# Patient Record
Sex: Female | Born: 1965
Health system: Southern US, Community
[De-identification: ages and names within clinical notes are randomized; demographics above are authoritative.]

## PROBLEM LIST (undated history)

## (undated) DIAGNOSIS — Z8739 Personal history of other diseases of the musculoskeletal system and connective tissue: Secondary | ICD-10-CM

## (undated) DIAGNOSIS — M545 Low back pain, unspecified: Secondary | ICD-10-CM

## (undated) DIAGNOSIS — R12 Heartburn: Secondary | ICD-10-CM

## (undated) DIAGNOSIS — L509 Urticaria, unspecified: Secondary | ICD-10-CM

## (undated) DIAGNOSIS — C439 Malignant melanoma of skin, unspecified: Secondary | ICD-10-CM

## (undated) DIAGNOSIS — J449 Chronic obstructive pulmonary disease, unspecified: Secondary | ICD-10-CM

## (undated) DIAGNOSIS — R002 Palpitations: Secondary | ICD-10-CM

## (undated) DIAGNOSIS — T7840XA Allergy, unspecified, initial encounter: Secondary | ICD-10-CM

## (undated) DIAGNOSIS — C801 Malignant (primary) neoplasm, unspecified: Secondary | ICD-10-CM

## (undated) DIAGNOSIS — R197 Diarrhea, unspecified: Secondary | ICD-10-CM

## (undated) DIAGNOSIS — R0602 Shortness of breath: Secondary | ICD-10-CM

## (undated) DIAGNOSIS — F329 Major depressive disorder, single episode, unspecified: Secondary | ICD-10-CM

## (undated) DIAGNOSIS — R87619 Unspecified abnormal cytological findings in specimens from cervix uteri: Secondary | ICD-10-CM

## (undated) DIAGNOSIS — J45909 Unspecified asthma, uncomplicated: Secondary | ICD-10-CM

## (undated) DIAGNOSIS — F32A Depression, unspecified: Secondary | ICD-10-CM

## (undated) DIAGNOSIS — K5900 Constipation, unspecified: Secondary | ICD-10-CM

## (undated) DIAGNOSIS — T783XXA Angioneurotic edema, initial encounter: Secondary | ICD-10-CM

## (undated) DIAGNOSIS — R7303 Prediabetes: Secondary | ICD-10-CM

## (undated) DIAGNOSIS — E669 Obesity, unspecified: Secondary | ICD-10-CM

## (undated) DIAGNOSIS — F419 Anxiety disorder, unspecified: Secondary | ICD-10-CM

## (undated) DIAGNOSIS — I38 Endocarditis, valve unspecified: Secondary | ICD-10-CM

## (undated) DIAGNOSIS — K589 Irritable bowel syndrome without diarrhea: Secondary | ICD-10-CM

## (undated) DIAGNOSIS — M2550 Pain in unspecified joint: Secondary | ICD-10-CM

## (undated) DIAGNOSIS — R32 Unspecified urinary incontinence: Secondary | ICD-10-CM

## (undated) DIAGNOSIS — R079 Chest pain, unspecified: Secondary | ICD-10-CM

## (undated) DIAGNOSIS — Z91018 Allergy to other foods: Secondary | ICD-10-CM

## (undated) DIAGNOSIS — E785 Hyperlipidemia, unspecified: Secondary | ICD-10-CM

## (undated) DIAGNOSIS — I10 Essential (primary) hypertension: Secondary | ICD-10-CM

## (undated) HISTORY — DX: Depression, unspecified: F32.A

## (undated) HISTORY — DX: Endocarditis, valve unspecified: I38

## (undated) HISTORY — DX: Unspecified abnormal cytological findings in specimens from cervix uteri: R87.619

## (undated) HISTORY — DX: Shortness of breath: R06.02

## (undated) HISTORY — DX: Obesity, unspecified: E66.9

## (undated) HISTORY — DX: Low back pain: M54.5

## (undated) HISTORY — DX: Malignant melanoma of skin, unspecified: C43.9

## (undated) HISTORY — DX: Urticaria, unspecified: L50.9

## (undated) HISTORY — DX: Allergy, unspecified, initial encounter: T78.40XA

## (undated) HISTORY — DX: Chronic obstructive pulmonary disease, unspecified: J44.9

## (undated) HISTORY — DX: Diarrhea, unspecified: R19.7

## (undated) HISTORY — DX: Personal history of other diseases of the musculoskeletal system and connective tissue: Z87.39

## (undated) HISTORY — DX: Irritable bowel syndrome, unspecified: K58.9

## (undated) HISTORY — PX: SKIN CANCER EXCISION: SHX779

## (undated) HISTORY — DX: Pain in unspecified joint: M25.50

## (undated) HISTORY — DX: Allergy to other foods: Z91.018

## (undated) HISTORY — DX: Major depressive disorder, single episode, unspecified: F32.9

## (undated) HISTORY — DX: Prediabetes: R73.03

## (undated) HISTORY — DX: Chest pain, unspecified: R07.9

## (undated) HISTORY — DX: Anxiety disorder, unspecified: F41.9

## (undated) HISTORY — DX: Heartburn: R12

## (undated) HISTORY — DX: Hyperlipidemia, unspecified: E78.5

## (undated) HISTORY — DX: Palpitations: R00.2

## (undated) HISTORY — DX: Constipation, unspecified: K59.00

## (undated) HISTORY — DX: Unspecified urinary incontinence: R32

## (undated) HISTORY — DX: Angioneurotic edema, initial encounter: T78.3XXA

## (undated) HISTORY — DX: Low back pain, unspecified: M54.50

## (undated) HISTORY — PX: TONSILLECTOMY: SUR1361

---

## 1993-05-18 HISTORY — PX: LEEP: SHX91

## 2000-05-18 DIAGNOSIS — C439 Malignant melanoma of skin, unspecified: Secondary | ICD-10-CM

## 2000-05-18 HISTORY — DX: Malignant melanoma of skin, unspecified: C43.9

## 2006-04-05 ENCOUNTER — Ambulatory Visit: Payer: Self-pay | Admitting: Internal Medicine

## 2007-05-17 ENCOUNTER — Ambulatory Visit: Payer: Self-pay | Admitting: Internal Medicine

## 2008-05-23 ENCOUNTER — Ambulatory Visit: Payer: Self-pay | Admitting: Internal Medicine

## 2008-06-04 ENCOUNTER — Ambulatory Visit: Payer: Self-pay | Admitting: Internal Medicine

## 2008-12-03 ENCOUNTER — Ambulatory Visit: Payer: Self-pay | Admitting: Internal Medicine

## 2009-06-11 ENCOUNTER — Ambulatory Visit: Payer: Self-pay | Admitting: Internal Medicine

## 2010-02-01 ENCOUNTER — Emergency Department: Payer: Self-pay | Admitting: Emergency Medicine

## 2010-04-14 ENCOUNTER — Other Ambulatory Visit: Payer: Self-pay | Admitting: Internal Medicine

## 2010-05-27 LAB — HM PAP SMEAR: HM Pap smear: NORMAL

## 2011-03-06 ENCOUNTER — Other Ambulatory Visit: Payer: Self-pay | Admitting: *Deleted

## 2011-03-06 ENCOUNTER — Other Ambulatory Visit: Payer: Self-pay | Admitting: Internal Medicine

## 2011-03-06 MED ORDER — FLUOXETINE HCL 10 MG PO CAPS: 10.0000 mg | ORAL_CAPSULE | Freq: Every day | ORAL | Status: DC

## 2011-03-06 MED ORDER — RANITIDINE HCL 150 MG PO TABS: 150.0000 mg | ORAL_TABLET | Freq: Two times a day (BID) | ORAL | Status: DC

## 2011-03-09 ENCOUNTER — Ambulatory Visit: Payer: Self-pay | Admitting: Internal Medicine

## 2011-03-10 ENCOUNTER — Encounter: Payer: Self-pay | Admitting: Internal Medicine

## 2011-03-10 ENCOUNTER — Ambulatory Visit (INDEPENDENT_AMBULATORY_CARE_PROVIDER_SITE_OTHER): Payer: Medicare Other | Admitting: Internal Medicine

## 2011-03-10 ENCOUNTER — Other Ambulatory Visit: Payer: Self-pay | Admitting: Internal Medicine

## 2011-03-10 VITALS — BP 124/87 | HR 75 | Temp 98.2°F | Resp 16 | Ht 62.0 in | Wt 211.0 lb

## 2011-03-10 DIAGNOSIS — J329 Chronic sinusitis, unspecified: Secondary | ICD-10-CM

## 2011-03-10 MED ORDER — AMOXICILLIN-POT CLAVULANATE 875-125 MG PO TABS: 1.0000 | ORAL_TABLET | Freq: Two times a day (BID) | ORAL | Status: AC

## 2011-03-10 MED ORDER — ALPRAZOLAM 0.5 MG PO TABS: ORAL_TABLET | ORAL | Status: DC

## 2011-03-10 NOTE — Telephone Encounter (Signed)
Yes, should be seen.

## 2011-03-10 NOTE — Progress Notes (Signed)
  Subjective:    Patient ID: Brittney Ruiz, female    DOB: 09-24-1965, 45 y.o.   MRN: [id number]  HPI  89 yowf with history of depression with anxiety, multple drug allergies presents with coughof four days duration, started on Friday initially productive of clear sputum  now green for the past 24 hours, accompanied  by frontal headache/pressure, terrible sore throat and an elevation of body temp by 2 pts.  Cannot take sudafed or sudafed PE bc "it makes her crazy." Has not been on antibioitics in over 6 months. . Past Medical History  Diagnosis Date  . Depression   . Allergy   . Anxiety   . Obesity (BMI 30-39.9)    Current Outpatient Prescriptions on File Prior to Visit  Medication Sig Dispense Refill  . ALPRAZolam (XANAX) 0.5 MG tablet Take one tablet in morning, 1 tablet at 11:00 am, one tablet at 3:00 pm, 1 Tablet at 7:00 pm, and 2 tablets at bedtime.  180 tablet  0  . FLUoxetine (PROZAC) 10 MG capsule Take 1 capsule (10 mg total) by mouth daily.  90 capsule  0  . ranitidine (ZANTAC) 150 MG tablet Take 1 tablet (150 mg total) by mouth 2 (two) times daily.  180 tablet  0        Review of Systems  Constitutional: Positive for fever, chills and fatigue.  HENT: Positive for ear pain, congestion, sore throat, rhinorrhea, trouble swallowing and postnasal drip. Negative for neck stiffness and ear discharge.   Respiratory: Positive for cough. Negative for shortness of breath and wheezing.   Cardiovascular: Negative for chest pain.  All other systems reviewed and are negative.       Objective:   Physical Exam  HENT:  Head: Normocephalic and atraumatic.  Right Ear: Tympanic membrane is bulging.  Left Ear: Tympanic membrane is bulging.  Nose: Right sinus exhibits frontal sinus tenderness. Left sinus exhibits frontal sinus tenderness.  Pulmonary/Chest: Effort normal and breath sounds normal. No accessory muscle usage. No respiratory distress.  Lymphadenopathy:    She has cervical  adenopathy.       Right cervical: Superficial cervical and posterior cervical adenopathy present.       Left cervical: Superficial cervical and posterior cervical adenopathy present.          Assessment & Plan:  Sinusist:  With associated otitis one exam;  Will treat with augmentin bid x 7 days #14.  OTC cough suppressants and Afrin recommended.

## 2011-03-10 NOTE — Telephone Encounter (Signed)
PT CALLED CHECKING ON RX  FEXOFEADINE HCL 180 1 DAILY ALPRAZOLAM 0.5MG  PT TAKE   1 IN AM  1 @11AM    1 @ 3PM    1 @ 7PM   2 @ BED TIME MEDICAP

## 2011-03-10 NOTE — Telephone Encounter (Signed)
SEE BELOW   PT ALSO WANTED TO BE SEEN  MADE APPOINTMENT FOR 10/31 COUGHING UP GREEN STUFF  CAN SHE BE SEE EARLY?

## 2011-03-10 NOTE — Telephone Encounter (Signed)
Should be seen early.

## 2011-03-11 ENCOUNTER — Encounter: Payer: Self-pay | Admitting: Internal Medicine

## 2011-03-11 DIAGNOSIS — F329 Major depressive disorder, single episode, unspecified: Secondary | ICD-10-CM | POA: Insufficient documentation

## 2011-03-11 DIAGNOSIS — T7840XA Allergy, unspecified, initial encounter: Secondary | ICD-10-CM | POA: Insufficient documentation

## 2011-03-11 DIAGNOSIS — F32A Depression, unspecified: Secondary | ICD-10-CM | POA: Insufficient documentation

## 2011-03-11 DIAGNOSIS — E669 Obesity, unspecified: Secondary | ICD-10-CM | POA: Insufficient documentation

## 2011-03-11 DIAGNOSIS — F419 Anxiety disorder, unspecified: Secondary | ICD-10-CM | POA: Insufficient documentation

## 2011-03-11 NOTE — Telephone Encounter (Signed)
Pt seen by dr Darrick Huntsman 03/11/11

## 2011-03-17 ENCOUNTER — Other Ambulatory Visit: Payer: Self-pay | Admitting: Internal Medicine

## 2011-03-17 MED ORDER — ALPRAZOLAM 0.5 MG PO TABS: ORAL_TABLET | ORAL | Status: DC

## 2011-03-17 MED ORDER — FEXOFENADINE HCL 180 MG PO TABS: 180.0000 mg | ORAL_TABLET | Freq: Every day | ORAL | Status: DC

## 2011-03-20 NOTE — Telephone Encounter (Signed)
Xanax faxed into pharm

## 2011-03-27 ENCOUNTER — Telehealth: Payer: Self-pay | Admitting: Internal Medicine

## 2011-03-27 ENCOUNTER — Encounter: Payer: Self-pay | Admitting: Internal Medicine

## 2011-03-27 ENCOUNTER — Ambulatory Visit (INDEPENDENT_AMBULATORY_CARE_PROVIDER_SITE_OTHER): Payer: Medicare Other | Admitting: Internal Medicine

## 2011-03-27 ENCOUNTER — Encounter: Payer: Medicare Other | Admitting: Internal Medicine

## 2011-03-27 VITALS — BP 120/86 | HR 75 | Temp 98.2°F | Wt 208.0 lb

## 2011-03-27 DIAGNOSIS — Z0000 Encounter for general adult medical examination without abnormal findings: Secondary | ICD-10-CM

## 2011-03-27 DIAGNOSIS — E785 Hyperlipidemia, unspecified: Secondary | ICD-10-CM

## 2011-03-27 DIAGNOSIS — J40 Bronchitis, not specified as acute or chronic: Secondary | ICD-10-CM

## 2011-03-27 LAB — COMPREHENSIVE METABOLIC PANEL
Alkaline Phosphatase: 57 U/L (ref 39–117)
CO2: 24 mEq/L (ref 19–32)
Creatinine, Ser: 0.7 mg/dL (ref 0.4–1.2)
GFR: 96.56 mL/min (ref >60.00)
Glucose, Bld: 92 mg/dL (ref 70–99)
Total Bilirubin: 0.6 mg/dL (ref 0.3–1.2)

## 2011-03-27 LAB — CBC WITH DIFFERENTIAL/PLATELET
Basophils Absolute: 0 10*3/uL (ref 0.0–0.1)
HCT: 42.5 % (ref 36.0–46.0)
Hemoglobin: 14.4 g/dL (ref 12.0–15.0)
Lymphs Abs: 2.4 10*3/uL (ref 0.7–4.0)
MCV: 90.2 fl (ref 78.0–100.0)
Monocytes Absolute: 0.5 10*3/uL (ref 0.1–1.0)
Monocytes Relative: 5.8 % (ref 3.0–12.0)
Neutro Abs: 5.1 10*3/uL (ref 1.4–7.7)
RDW: 13.3 % (ref 11.5–14.6)

## 2011-03-27 LAB — LIPID PANEL: HDL: 34.5 mg/dL — ABNORMAL LOW (ref >39.00)

## 2011-03-27 MED ORDER — LEVOFLOXACIN 750 MG PO TABS: 750.0000 mg | ORAL_TABLET | Freq: Every day | ORAL | Status: AC

## 2011-03-27 MED ORDER — ALBUTEROL SULFATE HFA 108 (90 BASE) MCG/ACT IN AERS: 2.0000 | INHALATION_SPRAY | Freq: Four times a day (QID) | RESPIRATORY_TRACT | Status: DC | PRN

## 2011-03-27 MED ORDER — SPACER/AERO CHAMBER MOUTHPIECE MISC: 1.0000 | Status: DC | PRN

## 2011-03-27 NOTE — Telephone Encounter (Signed)
Updated Allergy list

## 2011-03-27 NOTE — Progress Notes (Signed)
Subjective:    Patient ID: Brittney Ruiz, female    DOB: Mar 13, 1966, 45 y.o.   MRN: [id number]  HPI 45YO female presents for an acute visit complaining of sore throat, congestion, and cough over the last week. She notes that she was previously seen for similar symptoms approximately one month ago. She was treated with Augmentin and her symptoms improved. Then over the last week she has developed progressive sore throat and cough with some wheezing noted. She denies any chest pain or shortness of breath. She notes a history of COPD. She is a former smoker. She denies any fever or chills. She has several sick contacts.  Outpatient Encounter Prescriptions as of 03/27/2011  Medication Sig Dispense Refill  . ALPRAZolam (XANAX) 0.5 MG tablet Take one tablet in morning, 1 tablet at 11:00 am, one tablet at 3:00 pm, 1 Tablet at 7:00 pm, and 2 tablets at bedtime.  180 tablet  0  . Cetirizine HCl (ZYRTEC PO) Take 1 tablet by mouth daily.        Tery Sanfilippo Calcium (STOOL SOFTENER PO) Take 1 tablet by mouth as needed.        . fexofenadine (ALLEGRA) 180 MG tablet Take 1 tablet (180 mg total) by mouth daily.  30 tablet  3  . FLUoxetine (PROZAC) 10 MG capsule Take 1 capsule (10 mg total) by mouth daily.  90 capsule  0  . ranitidine (ZANTAC) 150 MG tablet Take 1 tablet (150 mg total) by mouth 2 (two) times daily.  180 tablet  0    Review of Systems  Constitutional: Negative for fever, chills, appetite change, fatigue and unexpected weight change.  HENT: Positive for congestion, sore throat, rhinorrhea and sinus pressure. Negative for ear pain, trouble swallowing, neck pain and voice change.   Eyes: Negative for visual disturbance.  Respiratory: Positive for cough and wheezing. Negative for shortness of breath and stridor.   Cardiovascular: Negative for chest pain, palpitations and leg swelling.  Gastrointestinal: Negative for nausea, vomiting, abdominal pain, diarrhea, constipation, blood in stool, abdominal  distention and anal bleeding.  Genitourinary: Negative for dysuria and flank pain.  Musculoskeletal: Negative for myalgias, arthralgias and gait problem.  Skin: Negative for color change and rash.  Neurological: Negative for dizziness and headaches.  Hematological: Negative for adenopathy. Does not bruise/bleed easily.  Psychiatric/Behavioral: Negative for suicidal ideas, sleep disturbance and dysphoric mood. The patient is not nervous/anxious.    BP 120/86  Pulse 75  Temp(Src) 98.2 F (36.8 C) (Oral)  Wt 208 lb (94.348 kg)  SpO2 96%  LMP 03/09/2011     Objective:   Physical Exam  Constitutional: She is oriented to person, place, and time. She appears well-developed and well-nourished. No distress.  HENT:  Head: Normocephalic and atraumatic.  Right Ear: External ear normal. A middle ear effusion is present.  Left Ear: External ear normal. A middle ear effusion is present.  Nose: Nose normal.  Mouth/Throat: Posterior oropharyngeal erythema present. No oropharyngeal exudate.  Eyes: Conjunctivae are normal. Pupils are equal, round, and reactive to light. Right eye exhibits no discharge. Left eye exhibits no discharge. No scleral icterus.  Neck: Normal range of motion. Neck supple. No tracheal deviation present. No thyromegaly present.  Cardiovascular: Normal rate, regular rhythm, normal heart sounds and intact distal pulses.  Exam reveals no gallop and no friction rub.   No murmur heard. Pulmonary/Chest: Effort normal. No respiratory distress. She has no decreased breath sounds. She has wheezes in the right upper field, the right  middle field and the right lower field. She has no rhonchi. She has no rales. She exhibits no tenderness.  Musculoskeletal: Normal range of motion. She exhibits no edema and no tenderness.  Lymphadenopathy:    She has no cervical adenopathy.  Neurological: She is alert and oriented to person, place, and time. No cranial nerve deficit. She exhibits normal  muscle tone. Coordination normal.  Skin: Skin is warm and dry. No rash noted. She is not diaphoretic. No erythema. No pallor.  Psychiatric: She has a normal mood and affect. Her behavior is normal. Judgment and thought content normal.          Assessment & Plan:  1. Bronchitis - Will treat with levaquin and prn albuterol. Pt unable to use steroids because of severe anxiety.  Pt will call if symptoms not improving. Otherwise, follow up next week.

## 2011-04-02 ENCOUNTER — Ambulatory Visit: Payer: Self-pay | Admitting: Internal Medicine

## 2011-04-03 ENCOUNTER — Ambulatory Visit (INDEPENDENT_AMBULATORY_CARE_PROVIDER_SITE_OTHER): Payer: Medicare Other | Admitting: Internal Medicine

## 2011-04-03 ENCOUNTER — Other Ambulatory Visit (HOSPITAL_COMMUNITY)
Admission: RE | Admit: 2011-04-03 | Discharge: 2011-04-03 | Disposition: A | Discharge status: Home / Self Care | Payer: Medicare Other | Source: Ambulatory Visit | Attending: Internal Medicine | Admitting: Internal Medicine

## 2011-04-03 ENCOUNTER — Encounter: Payer: Self-pay | Admitting: Internal Medicine

## 2011-04-03 VITALS — BP 104/76 | HR 85 | Temp 98.2°F | Resp 16 | Ht 62.5 in | Wt 209.2 lb

## 2011-04-03 DIAGNOSIS — Z01419 Encounter for gynecological examination (general) (routine) without abnormal findings: Secondary | ICD-10-CM | POA: Insufficient documentation

## 2011-04-03 DIAGNOSIS — Z0000 Encounter for general adult medical examination without abnormal findings: Secondary | ICD-10-CM

## 2011-04-03 DIAGNOSIS — R8290 Unspecified abnormal findings in urine: Secondary | ICD-10-CM

## 2011-04-03 DIAGNOSIS — R82998 Other abnormal findings in urine: Secondary | ICD-10-CM

## 2011-04-03 DIAGNOSIS — L988 Other specified disorders of the skin and subcutaneous tissue: Secondary | ICD-10-CM

## 2011-04-03 LAB — POCT URINALYSIS DIPSTICK
Bilirubin, UA: NEGATIVE
Glucose, UA: NEGATIVE
Ketones, UA: NEGATIVE
Leukocytes, UA: NEGATIVE
pH, UA: 5.5

## 2011-04-03 NOTE — Progress Notes (Signed)
Subjective:    Patient ID: Brittney Ruiz, female    DOB: 1965/12/02, 45 y.o.   MRN: [id number]  HPI 45YO female presents for annual exam. Feeling well. Bronchitis resolved with treatment with levaquin, however did have left knee pain and swelling on medication and stopped after 5 days. No further cough/congestion. Denies any new complaints today. Has noted a new brown mole on her right foot at site of previous melanoma excision. Noted this only a few days ago. Has not had recent follow up with dermatology. Otherwise, doing well. We reviewed labs from previous visit and discussed need to repeat slightly elevated LFT.  Outpatient Encounter Prescriptions as of 04/03/2011  Medication Sig Dispense Refill  . ACETAMINOPHEN PO Take by mouth as needed.        Marland Kitchen albuterol (PROAIR HFA) 108 (90 BASE) MCG/ACT inhaler Inhale 2 puffs into the lungs every 6 (six) hours as needed for wheezing.  1 Inhaler  3  . ALPRAZolam (XANAX) 0.5 MG tablet Take one tablet in morning, 1 tablet at 11:00 am, one tablet at 3:00 pm, 1 Tablet at 7:00 pm, and 2 tablets at bedtime.  180 tablet  0  . Cetirizine HCl (ZYRTEC PO) Take 1 tablet by mouth daily.        Tery Sanfilippo Calcium (STOOL SOFTENER PO) Take 1 tablet by mouth as needed.        . fexofenadine (ALLEGRA) 180 MG tablet Take 1 tablet (180 mg total) by mouth daily.  30 tablet  3  . FLUoxetine (PROZAC) 10 MG capsule Take 1 capsule (10 mg total) by mouth daily.  90 capsule  0  . ranitidine (ZANTAC) 150 MG tablet Take 1 tablet (150 mg total) by mouth 2 (two) times daily.  180 tablet  0  . Spacer/Aero Chamber Mouthpiece MISC 1 puff by Does not apply route as needed.  1 each  1  . levofloxacin (LEVAQUIN) 750 MG tablet Take 1 tablet (750 mg total) by mouth daily.  7 tablet  0    Review of Systems  Constitutional: Negative for fever, chills, appetite change, fatigue and unexpected weight change.  HENT: Negative for ear pain, congestion, sore throat, trouble swallowing, neck pain,  voice change and sinus pressure.   Eyes: Negative for visual disturbance.  Respiratory: Negative for cough, shortness of breath, wheezing and stridor.   Cardiovascular: Negative for chest pain, palpitations and leg swelling.  Gastrointestinal: Positive for constipation. Negative for nausea, vomiting, abdominal pain, diarrhea, blood in stool, abdominal distention and anal bleeding.  Genitourinary: Negative for dysuria, urgency, frequency, hematuria, flank pain, vaginal bleeding, vaginal discharge and pelvic pain.  Musculoskeletal: Negative for myalgias, arthralgias and gait problem.  Skin: Negative for color change and rash.  Neurological: Negative for dizziness and headaches.  Hematological: Negative for adenopathy. Does not bruise/bleed easily.  Psychiatric/Behavioral: Negative for suicidal ideas, sleep disturbance and dysphoric mood. The patient is nervous/anxious.    BP 104/76  Pulse 85  Temp(Src) 98.2 F (36.8 C) (Oral)  Resp 16  Ht 5' 2.5" (1.588 m)  Wt 209 lb 4 oz (94.915 kg)  BMI 37.66 kg/m2  SpO2 97%  LMP 03/09/2011     Objective:   Physical Exam  Constitutional: She is oriented to person, place, and time. She appears well-developed and well-nourished. No distress.  HENT:  Head: Normocephalic and atraumatic.  Right Ear: External ear normal.  Left Ear: External ear normal.  Nose: Nose normal.  Mouth/Throat: Oropharynx is clear and moist. No oropharyngeal exudate.  Eyes:  Conjunctivae are normal. Pupils are equal, round, and reactive to light. Right eye exhibits no discharge. Left eye exhibits no discharge. No scleral icterus.  Neck: Normal range of motion. Neck supple. No tracheal deviation present. No thyromegaly present.  Cardiovascular: Normal rate, regular rhythm, normal heart sounds and intact distal pulses.  Exam reveals no gallop and no friction rub.   No murmur heard. Pulmonary/Chest: Effort normal and breath sounds normal. No respiratory distress. She has no  wheezes. She has no rales. She exhibits no tenderness.  Abdominal: Soft. Bowel sounds are normal. She exhibits no distension and no mass. There is no tenderness. There is no rebound and no guarding.  Genitourinary: Rectum normal and uterus normal. No breast swelling, tenderness, discharge or bleeding. Pelvic exam was performed with patient prone. There is no rash, tenderness or lesion on the right labia. There is no rash, tenderness or lesion on the left labia. Uterus is not enlarged and not tender. Cervix exhibits no motion tenderness, no discharge and no friability. Right adnexum displays no mass, no tenderness and no fullness. Left adnexum displays no mass, no tenderness and no fullness. No erythema or tenderness around the vagina. Vaginal discharge (yellow) found.    Musculoskeletal: Normal range of motion. She exhibits no edema and no tenderness.  Lymphadenopathy:    She has no cervical adenopathy.  Neurological: She is alert and oriented to person, place, and time. No cranial nerve deficit. She exhibits normal muscle tone. Coordination normal.  Skin: Skin is warm and dry. No rash noted. She is not diaphoretic. No erythema. No pallor.     Psychiatric: She has a normal mood and affect. Her behavior is normal. Judgment and thought content normal.          Assessment & Plan:  1. General Exam - Exam today normal including breast and pelvic exam. PAP pending.  Labs including CBC, CMP, lipids reviewed.   2. Abnormal LFT - Suspect steatohepatitis. Will repeat LFTs in 21month given mild elevation ALT.  3. Macule right foot - Pt will set up follow up with dermatology given h/o melanoma at this location.

## 2011-04-13 ENCOUNTER — Telehealth: Payer: Self-pay | Admitting: Internal Medicine

## 2011-04-13 MED ORDER — AMOXICILLIN-POT CLAVULANATE 875-125 MG PO TABS: 1.0000 | ORAL_TABLET | Freq: Two times a day (BID) | ORAL | Status: DC

## 2011-04-13 NOTE — Telephone Encounter (Signed)
She has numerous drug allergies. Can she tolerate Augmentin? If yes, then Augmentin 875mg  po bid x 10 days.

## 2011-04-13 NOTE — Telephone Encounter (Signed)
She was feeling better in terms of bronchitis at last visit. If symptoms recurrent at this point, needs to be seen.

## 2011-04-13 NOTE — Telephone Encounter (Signed)
Patient is not feeling any better could you call in something for this patient .

## 2011-04-13 NOTE — Telephone Encounter (Signed)
Spoke w/patient. She has apt w/oncologist Wednesday with chest xray. She will have them send over results of cxr. She c/o low grade fever, sinus pressure and pain, productive cough w/green-yellow mucus and ear pressure. She can not take decongestants but can take mucinex. I advised she resume mucinex, rest and plenty of fluids. She is very worried and wants another antibiotic... Says she has been in the office every week recently and can't afford to come back due to derm apt in [facility] Wednesday. Please advise.

## 2011-04-13 NOTE — Telephone Encounter (Signed)
Spoke w/pt - Yes she can take augmentin, RX sent in

## 2011-04-21 ENCOUNTER — Encounter: Payer: Self-pay | Admitting: Internal Medicine

## 2011-04-27 ENCOUNTER — Other Ambulatory Visit: Payer: Self-pay | Admitting: Internal Medicine

## 2011-04-27 DIAGNOSIS — K219 Gastro-esophageal reflux disease without esophagitis: Secondary | ICD-10-CM

## 2011-04-28 ENCOUNTER — Other Ambulatory Visit: Payer: Self-pay | Admitting: *Deleted

## 2011-04-28 ENCOUNTER — Telehealth: Payer: Self-pay | Admitting: *Deleted

## 2011-04-28 MED ORDER — FLUTICASONE PROPIONATE 50 MCG/ACT NA SUSP: 2.0000 | Freq: Every day | NASAL | Status: DC

## 2011-04-28 NOTE — Telephone Encounter (Signed)
OK to fill #60

## 2011-04-28 NOTE — Telephone Encounter (Signed)
Pharm faxed RF request for alprazolam 0.5 mg 1 bid prn #60. OK?

## 2011-04-29 MED ORDER — ALPRAZOLAM 0.5 MG PO TABS: ORAL_TABLET | ORAL | Status: DC

## 2011-04-29 NOTE — Telephone Encounter (Signed)
Spoke w/pharm, request is actually for one in am, at 11, at 3, at 7 and 2 HS. Is this OK? She gets #180 for month supply.

## 2011-04-29 NOTE — Telephone Encounter (Signed)
Called in.

## 2011-04-29 NOTE — Telephone Encounter (Signed)
Fine to fill #180 with 3 refill

## 2011-04-30 ENCOUNTER — Ambulatory Visit: Payer: Medicare Other

## 2011-05-06 ENCOUNTER — Ambulatory Visit: Payer: Medicare Other | Admitting: Internal Medicine

## 2011-05-08 ENCOUNTER — Ambulatory Visit: Payer: Medicare Other

## 2011-05-08 ENCOUNTER — Ambulatory Visit (INDEPENDENT_AMBULATORY_CARE_PROVIDER_SITE_OTHER): Payer: Medicare Other | Admitting: Internal Medicine

## 2011-05-08 ENCOUNTER — Telehealth: Payer: Self-pay | Admitting: *Deleted

## 2011-05-08 VITALS — BP 120/84 | HR 89 | Temp 98.4°F

## 2011-05-08 DIAGNOSIS — J029 Acute pharyngitis, unspecified: Secondary | ICD-10-CM

## 2011-05-08 DIAGNOSIS — H6693 Otitis media, unspecified, bilateral: Secondary | ICD-10-CM

## 2011-05-08 DIAGNOSIS — R509 Fever, unspecified: Secondary | ICD-10-CM

## 2011-05-08 DIAGNOSIS — R52 Pain, unspecified: Secondary | ICD-10-CM

## 2011-05-08 DIAGNOSIS — H6690 Otitis media, unspecified, unspecified ear: Secondary | ICD-10-CM

## 2011-05-08 LAB — POCT INFLUENZA A/B
Influenza A, POC: NEGATIVE
Influenza B, POC: NEGATIVE

## 2011-05-08 MED ORDER — AMOXICILLIN-POT CLAVULANATE 875-125 MG PO TABS: 1.0000 | ORAL_TABLET | Freq: Two times a day (BID) | ORAL | Status: AC

## 2011-05-08 NOTE — Telephone Encounter (Signed)
Spoke with pt - she c/o sudden onset of fever, chills, body aches, severe h/a and ST. OK for rapid flu test today per MD. Pt informed and scheduled on nurse schedule this afternoon

## 2011-05-09 ENCOUNTER — Encounter: Payer: Self-pay | Admitting: Internal Medicine

## 2011-05-09 NOTE — Progress Notes (Signed)
Subjective:    Patient ID: Brittney Ruiz, female    DOB: Sep 03, 1965, 45 y.o.   MRN: [id number]  HPI 45 year old female presents for an acute visit complaining of fever, chills, bilateral ear pain, cough, sore throat. She notes that her symptoms first began a couple of days ago. She had some nasal congestion that began several weeks ago but her more acute symptoms began 48 hours ago. She reports that her sore throat is severe. She does not have difficulty swallowing. She reports cough productive of purulent sputum. She denies any chest pain or shortness of breath. She denies any myalgia. She has not had a flu shot. She has been using over-the-counter cough and cold preparations with no improvement.  Outpatient Encounter Prescriptions as of 05/08/2011  Medication Sig Dispense Refill  . ACETAMINOPHEN PO Take by mouth as needed.        Marland Kitchen albuterol (PROAIR HFA) 108 (90 BASE) MCG/ACT inhaler Inhale 2 puffs into the lungs every 6 (six) hours as needed for wheezing.  1 Inhaler  3  . ALPRAZolam (XANAX) 0.5 MG tablet Take one tablet in morning, 1 tablet at 11:00 am, one tablet at 3:00 pm, 1 Tablet at 7:00 pm, and 2 tablets at bedtime.  180 tablet  0  . Cetirizine HCl (ZYRTEC PO) Take 1 tablet by mouth daily.        Tery Sanfilippo Calcium (STOOL SOFTENER PO) Take 1 tablet by mouth as needed.        . fexofenadine (ALLEGRA) 180 MG tablet Take 1 tablet (180 mg total) by mouth daily.  30 tablet  3  . FLUoxetine (PROZAC) 10 MG capsule Take 1 capsule (10 mg total) by mouth daily.  90 capsule  0  . fluticasone (FLONASE) 50 MCG/ACT nasal spray Place 2 sprays into the nose daily.  16 g  5  . ranitidine (ZANTAC) 150 MG tablet TAKE ONE TABLET BY MOUTH TWICE DAILY  180 tablet  3  . Spacer/Aero Chamber Mouthpiece MISC 1 puff by Does not apply route as needed.  1 each  1    Review of Systems  Constitutional: Positive for fever and chills. Negative for unexpected weight change.  HENT: Positive for ear pain, congestion,  rhinorrhea, postnasal drip and sinus pressure. Negative for hearing loss, nosebleeds, sore throat, facial swelling, sneezing, mouth sores, trouble swallowing, neck pain, neck stiffness, voice change, tinnitus and ear discharge.   Eyes: Negative for pain, discharge, redness and visual disturbance.  Respiratory: Positive for cough. Negative for chest tightness, shortness of breath, wheezing and stridor.   Cardiovascular: Negative for chest pain, palpitations and leg swelling.  Musculoskeletal: Negative for myalgias and arthralgias.  Skin: Negative for color change and rash.  Neurological: Negative for dizziness, weakness, light-headedness and headaches.  Hematological: Negative for adenopathy.   BP 120/84  Pulse 89  Temp(Src) 98.4 F (36.9 C) (Oral)  SpO2 97%     Objective:   Physical Exam  Constitutional: She is oriented to person, place, and time. She appears well-developed and well-nourished. No distress.  HENT:  Head: Normocephalic and atraumatic.  Right Ear: External ear normal. Tympanic membrane is erythematous and bulging. A middle ear effusion is present.  Left Ear: External ear normal. Tympanic membrane is erythematous and bulging. A middle ear effusion is present.  Nose: Nose normal.  Mouth/Throat: Oropharynx is clear and moist. No oropharyngeal exudate.  Eyes: Conjunctivae are normal. Pupils are equal, round, and reactive to light. Right eye exhibits no discharge. Left eye exhibits no discharge.  No scleral icterus.  Neck: Normal range of motion. Neck supple. No tracheal deviation present. No thyromegaly present.  Cardiovascular: Normal rate, regular rhythm, normal heart sounds and intact distal pulses.  Exam reveals no gallop and no friction rub.   No murmur heard. Pulmonary/Chest: Effort normal and breath sounds normal. No respiratory distress. She has no wheezes. She has no rales. She exhibits no tenderness.  Musculoskeletal: Normal range of motion. She exhibits no edema and  no tenderness.  Lymphadenopathy:    She has no cervical adenopathy.  Neurological: She is alert and oriented to person, place, and time. No cranial nerve deficit. She exhibits normal muscle tone. Coordination normal.  Skin: Skin is warm and dry. No rash noted. She is not diaphoretic. No erythema. No pallor.  Psychiatric: She has a normal mood and affect. Her behavior is normal. Judgment and thought content normal.          Assessment & Plan:  1. Otitis media -symptoms are most consistent with viral upper respiratory infection however, patient does have several week history of nasal congestion and now has bilateral middle ear effusions with erythema. Will treat with Augmentin x10 days. She will continue to use ibuprofen as needed for pain and fever. She will call if her symptoms are not improving within the next 48 hours.

## 2011-05-14 ENCOUNTER — Other Ambulatory Visit: Payer: Self-pay | Admitting: *Deleted

## 2011-05-14 ENCOUNTER — Other Ambulatory Visit: Payer: Self-pay | Admitting: Internal Medicine

## 2011-05-14 DIAGNOSIS — K219 Gastro-esophageal reflux disease without esophagitis: Secondary | ICD-10-CM

## 2011-05-14 MED ORDER — RANITIDINE HCL 150 MG PO TABS: ORAL_TABLET | ORAL | Status: DC

## 2011-05-14 MED ORDER — RANITIDINE HCL 150 MG PO TABS: 150.0000 mg | ORAL_TABLET | ORAL | Status: DC

## 2011-05-14 NOTE — Telephone Encounter (Signed)
Rx confirmed in chart. She is to take 1 every AM and 2 every PM. Corrected with pharmacy and refilled.

## 2011-05-14 NOTE — Telephone Encounter (Signed)
Resent refill electronically.  

## 2011-06-04 ENCOUNTER — Other Ambulatory Visit: Payer: Self-pay | Admitting: Internal Medicine

## 2011-06-04 ENCOUNTER — Other Ambulatory Visit: Payer: Self-pay | Admitting: *Deleted

## 2011-06-04 MED ORDER — ALPRAZOLAM 0.5 MG PO TABS: ORAL_TABLET | ORAL | Status: DC

## 2011-06-11 ENCOUNTER — Encounter: Payer: Self-pay | Admitting: Internal Medicine

## 2011-07-01 ENCOUNTER — Telehealth: Payer: Self-pay | Admitting: Internal Medicine

## 2011-07-01 NOTE — Telephone Encounter (Signed)
[phone] Pt stated she did a my chart note on 06/11/11 about getting her med. She was by someone at the office to use my chart to request this.   She needs prior approval for her meds for her alpriazolam.  [employer] is her insurance  The ins will only approve 120 per month and she take 180 [employer] [phone] Pt stated all this was in the mychart note she sent 06/11/11

## 2011-07-01 NOTE — Telephone Encounter (Signed)
Form requested

## 2011-07-02 ENCOUNTER — Emergency Department: Payer: Self-pay | Admitting: Emergency Medicine

## 2011-07-02 ENCOUNTER — Encounter: Payer: Self-pay | Admitting: *Deleted

## 2011-07-02 DIAGNOSIS — S93409A Sprain of unspecified ligament of unspecified ankle, initial encounter: Secondary | ICD-10-CM | POA: Diagnosis not present

## 2011-07-02 DIAGNOSIS — Z79899 Other long term (current) drug therapy: Secondary | ICD-10-CM | POA: Diagnosis not present

## 2011-07-02 DIAGNOSIS — F4000 Agoraphobia, unspecified: Secondary | ICD-10-CM | POA: Insufficient documentation

## 2011-07-02 DIAGNOSIS — M25579 Pain in unspecified ankle and joints of unspecified foot: Secondary | ICD-10-CM | POA: Diagnosis not present

## 2011-07-02 NOTE — Telephone Encounter (Signed)
Form pending completion by MD

## 2011-07-09 ENCOUNTER — Encounter: Payer: Self-pay | Admitting: Internal Medicine

## 2011-07-10 NOTE — Telephone Encounter (Signed)
Form was faxed in on 2/20 (late pm) I explained to patient that the insurance had 24 to 72 hours to make a decision. I checked w/pharm, they will continue to run claim and notify patient. Pt and pharm will call office w/any problems.

## 2011-07-10 NOTE — Telephone Encounter (Signed)
See phone note

## 2011-07-13 ENCOUNTER — Telehealth: Payer: Self-pay | Admitting: *Deleted

## 2011-07-13 MED ORDER — ALPRAZOLAM 1 MG PO TABS: ORAL_TABLET | ORAL | Status: DC

## 2011-07-13 NOTE — Telephone Encounter (Signed)
Agree with plan. She should let us know if any problems after this change. We could always try changing to another benzodiazepine.

## 2011-07-13 NOTE — Telephone Encounter (Signed)
Xanax qty PA denied for 180/mth. I called pharm and changed RX to xanax 1 mg 1/2 AM, 1/2 at 11:00, 1/2 at 3pm, 1/2 at 7pm and 1 pill HS. # 90 w/1 RF called in. Per [employer] covered RX this way.

## 2011-08-10 ENCOUNTER — Other Ambulatory Visit: Payer: Self-pay | Admitting: Internal Medicine

## 2011-08-20 ENCOUNTER — Encounter: Payer: Self-pay | Admitting: Internal Medicine

## 2011-09-15 ENCOUNTER — Other Ambulatory Visit: Payer: Self-pay | Admitting: *Deleted

## 2011-09-15 MED ORDER — ALPRAZOLAM 1 MG PO TABS: ORAL_TABLET | ORAL | Status: DC

## 2011-09-15 NOTE — Telephone Encounter (Signed)
Fine to fill. Needs to have follow up.

## 2011-09-15 NOTE — Telephone Encounter (Signed)
Alprazolam request [last refill 02.25.13 #[id number]] Please advise/SLS

## 2011-09-15 NOTE — Telephone Encounter (Signed)
Patient informed; F/U scheduled 05.13.13 at 1:15pm. Rx sent to pharmacy/SLS

## 2011-09-28 ENCOUNTER — Ambulatory Visit (INDEPENDENT_AMBULATORY_CARE_PROVIDER_SITE_OTHER): Payer: Medicare Other | Admitting: Internal Medicine

## 2011-09-28 ENCOUNTER — Encounter: Payer: Self-pay | Admitting: Internal Medicine

## 2011-09-28 VITALS — BP 125/91 | HR 86 | Temp 98.1°F | Resp 16 | Wt 208.5 lb

## 2011-09-28 DIAGNOSIS — E785 Hyperlipidemia, unspecified: Secondary | ICD-10-CM | POA: Diagnosis not present

## 2011-09-28 DIAGNOSIS — F411 Generalized anxiety disorder: Secondary | ICD-10-CM | POA: Diagnosis not present

## 2011-09-28 DIAGNOSIS — R7401 Elevation of levels of liver transaminase levels: Secondary | ICD-10-CM

## 2011-09-28 DIAGNOSIS — F419 Anxiety disorder, unspecified: Secondary | ICD-10-CM

## 2011-09-28 DIAGNOSIS — R7402 Elevation of levels of lactic acid dehydrogenase (LDH): Secondary | ICD-10-CM

## 2011-09-28 LAB — COMPREHENSIVE METABOLIC PANEL
Albumin: 4.1 g/dL (ref 3.5–5.2)
Alkaline Phosphatase: 50 U/L (ref 39–117)
BUN: 11 mg/dL (ref 6–23)
CO2: 22 mEq/L (ref 19–32)
Calcium: 8.7 mg/dL (ref 8.4–10.5)
GFR: 96.34 mL/min (ref >60.00)
Glucose, Bld: 94 mg/dL (ref 70–99)
Potassium: 3.8 mEq/L (ref 3.5–5.1)
Sodium: 137 mEq/L (ref 135–145)
Total Protein: 7 g/dL (ref 6.0–8.3)

## 2011-09-28 LAB — LIPID PANEL
Cholesterol: 218 mg/dL — ABNORMAL HIGH (ref 0–200)
Triglycerides: 404 mg/dL — ABNORMAL HIGH (ref 0.0–149.0)

## 2011-09-28 NOTE — Assessment & Plan Note (Signed)
Likely secondary to polypharmacy. Will repeat CMP today. If no improvement, would favor getting liver US.

## 2011-09-28 NOTE — Assessment & Plan Note (Signed)
Will continue xanax, as this has been working well for her. Follow up 6 months.

## 2011-09-28 NOTE — Assessment & Plan Note (Signed)
Goal LDL <100. Will check lipids with labs today.

## 2011-09-28 NOTE — Progress Notes (Signed)
  Subjective:    Patient ID: Brittney Ruiz, female    DOB: Aug 29, 1965, 46 y.o.   MRN: [id number]  HPI 46YO female with h/o anxiety, agoraphobia, depression, allergies and recent episode of moderately elevated LFTs presents for follow up. Doing well. Reports current dose of xanax controlling anxiety symptoms well. No new complaints/concerns today.  BP 125/91  Pulse 86  Temp(Src) 98.1 F (36.7 C) (Oral)  Resp 16  Wt 208 lb 8 oz (94.575 kg)  SpO2 97%  Review of Systems  Constitutional: Negative for fever, chills, appetite change, fatigue and unexpected weight change.  HENT: Positive for congestion, rhinorrhea, sneezing and postnasal drip. Negative for ear pain, sore throat, trouble swallowing, neck pain, voice change and sinus pressure.   Eyes: Negative for visual disturbance.  Respiratory: Negative for cough, shortness of breath, wheezing and stridor.   Cardiovascular: Negative for chest pain, palpitations and leg swelling.  Gastrointestinal: Negative for nausea, vomiting, abdominal pain, diarrhea, constipation, blood in stool, abdominal distention and anal bleeding.  Genitourinary: Negative for dysuria and flank pain.  Musculoskeletal: Negative for myalgias, arthralgias and gait problem.  Skin: Negative for color change and rash.  Neurological: Negative for dizziness and headaches.  Hematological: Negative for adenopathy. Does not bruise/bleed easily.  Psychiatric/Behavioral: Negative for suicidal ideas, sleep disturbance and dysphoric mood. The patient is nervous/anxious.        Objective:   Physical Exam  Constitutional: She is oriented to person, place, and time. She appears well-developed and well-nourished. No distress.  HENT:  Head: Normocephalic and atraumatic.  Right Ear: External ear normal.  Left Ear: External ear normal.  Nose: Nose normal.  Mouth/Throat: Oropharynx is clear and moist. No oropharyngeal exudate.  Eyes: Conjunctivae are normal. Pupils are equal, round, and  reactive to light. Right eye exhibits no discharge. Left eye exhibits no discharge. No scleral icterus.  Neck: Normal range of motion. Neck supple. No tracheal deviation present. No thyromegaly present.  Cardiovascular: Normal rate, regular rhythm, normal heart sounds and intact distal pulses.  Exam reveals no gallop and no friction rub.   No murmur heard. Pulmonary/Chest: Effort normal and breath sounds normal. No respiratory distress. She has no wheezes. She has no rales. She exhibits no tenderness.  Musculoskeletal: Normal range of motion. She exhibits no edema and no tenderness.  Lymphadenopathy:    She has no cervical adenopathy.  Neurological: She is alert and oriented to person, place, and time. No cranial nerve deficit. She exhibits normal muscle tone. Coordination normal.  Skin: Skin is warm and dry. No rash noted. She is not diaphoretic. No erythema. No pallor.  Psychiatric: She has a normal mood and affect. Her behavior is normal. Judgment and thought content normal.          Assessment & Plan:

## 2011-10-16 ENCOUNTER — Other Ambulatory Visit: Payer: Self-pay | Admitting: *Deleted

## 2011-10-16 ENCOUNTER — Other Ambulatory Visit: Payer: Self-pay | Admitting: Internal Medicine

## 2011-10-16 NOTE — Telephone Encounter (Signed)
Alprazolam request [last refill 04.30.13 #[id number]] Please advise.

## 2011-10-16 NOTE — Telephone Encounter (Signed)
Fine to refill. #90 with 2 refills. 

## 2011-10-20 NOTE — Telephone Encounter (Signed)
Rx called to pharmacy

## 2011-10-21 ENCOUNTER — Other Ambulatory Visit: Payer: Self-pay | Admitting: *Deleted

## 2011-10-21 MED ORDER — ALPRAZOLAM 1 MG PO TABS: ORAL_TABLET | ORAL | Status: DC

## 2011-10-21 NOTE — Telephone Encounter (Signed)
Rx called to [facility] pharmacy. 

## 2011-11-17 ENCOUNTER — Other Ambulatory Visit: Payer: Self-pay | Admitting: *Deleted

## 2011-11-17 MED ORDER — ALPRAZOLAM 1 MG PO TABS: ORAL_TABLET | ORAL | Status: DC

## 2011-11-23 NOTE — Telephone Encounter (Signed)
Rx called to pharmacy

## 2011-12-15 ENCOUNTER — Other Ambulatory Visit: Payer: Self-pay | Admitting: *Deleted

## 2011-12-15 MED ORDER — FEXOFENADINE HCL 180 MG PO TABS: 180.0000 mg | ORAL_TABLET | Freq: Every day | ORAL | Status: DC

## 2012-01-21 ENCOUNTER — Other Ambulatory Visit: Payer: Self-pay | Admitting: *Deleted

## 2012-01-21 MED ORDER — ALPRAZOLAM 1 MG PO TABS: ORAL_TABLET | ORAL | Status: DC

## 2012-01-27 NOTE — Telephone Encounter (Signed)
Rx called to [facility] pharmacy. 

## 2012-02-16 ENCOUNTER — Telehealth: Payer: Self-pay | Admitting: Internal Medicine

## 2012-02-16 NOTE — Telephone Encounter (Signed)
Refill request Zantac 150 150 mg t SIG: take 1 tablet in the morning and take 2 tablets in the evening

## 2012-02-17 MED ORDER — RANITIDINE HCL 150 MG PO TABS: ORAL_TABLET | ORAL | Status: DC

## 2012-02-18 ENCOUNTER — Other Ambulatory Visit: Payer: Self-pay | Admitting: Internal Medicine

## 2012-02-18 NOTE — Telephone Encounter (Signed)
Refill request zantac 150 mg t Sig: take 1 tablet in the morning and take 2 tablets in the evening 90 day supply

## 2012-02-19 MED ORDER — RANITIDINE HCL 150 MG PO TABS: ORAL_TABLET | ORAL | Status: DC

## 2012-03-18 ENCOUNTER — Other Ambulatory Visit: Payer: Self-pay | Admitting: *Deleted

## 2012-03-18 NOTE — Telephone Encounter (Signed)
Called patient on cell phone voicemail and mailbox was full, will call patient back on Monday.

## 2012-03-22 NOTE — Telephone Encounter (Signed)
Called patient again on cell phone number and mailbox is full.  I will wait to hear from patient regarding refills.

## 2012-03-25 ENCOUNTER — Other Ambulatory Visit: Payer: Self-pay | Admitting: *Deleted

## 2012-03-25 MED ORDER — CETIRIZINE HCL 10 MG PO TABS: 5.0000 mg | ORAL_TABLET | Freq: Every day | ORAL | Status: DC

## 2012-03-27 MED ORDER — ALPRAZOLAM 1 MG PO TABS: ORAL_TABLET | ORAL | Status: DC

## 2012-03-28 NOTE — Telephone Encounter (Signed)
Rx called to [facility] pharmacy. 

## 2012-03-31 ENCOUNTER — Encounter: Payer: Medicare Other | Admitting: Internal Medicine

## 2012-04-19 ENCOUNTER — Other Ambulatory Visit: Payer: Self-pay | Admitting: General Practice

## 2012-04-19 NOTE — Telephone Encounter (Signed)
No. She should not need a refill this soon. She will also need q6 month follow up

## 2012-04-19 NOTE — Telephone Encounter (Signed)
Alprazolam refill request. Last filled on 03/25/12 qty 90. Pt last seen on 09/28/11. Ok to refill?

## 2012-04-20 NOTE — Telephone Encounter (Signed)
Med denied, requested to soon. Also pt needs OV for additional refills.

## 2012-04-21 ENCOUNTER — Ambulatory Visit: Payer: Self-pay | Admitting: Internal Medicine

## 2012-04-21 ENCOUNTER — Ambulatory Visit (INDEPENDENT_AMBULATORY_CARE_PROVIDER_SITE_OTHER): Payer: Medicare Other | Admitting: Internal Medicine

## 2012-04-21 ENCOUNTER — Encounter: Payer: Self-pay | Admitting: Internal Medicine

## 2012-04-21 VITALS — BP 126/82 | HR 84 | Temp 98.2°F | Resp 16 | Ht 62.5 in | Wt 211.5 lb

## 2012-04-21 DIAGNOSIS — Z0000 Encounter for general adult medical examination without abnormal findings: Secondary | ICD-10-CM | POA: Diagnosis not present

## 2012-04-21 DIAGNOSIS — F419 Anxiety disorder, unspecified: Secondary | ICD-10-CM

## 2012-04-21 DIAGNOSIS — D649 Anemia, unspecified: Secondary | ICD-10-CM

## 2012-04-21 DIAGNOSIS — F411 Generalized anxiety disorder: Secondary | ICD-10-CM | POA: Diagnosis not present

## 2012-04-21 DIAGNOSIS — Z1231 Encounter for screening mammogram for malignant neoplasm of breast: Secondary | ICD-10-CM | POA: Diagnosis not present

## 2012-04-21 DIAGNOSIS — E785 Hyperlipidemia, unspecified: Secondary | ICD-10-CM | POA: Diagnosis not present

## 2012-04-21 LAB — CBC WITH DIFFERENTIAL/PLATELET
Eosinophils Relative: 2.9 % (ref 0.0–5.0)
HCT: 40 % (ref 36.0–46.0)
Hemoglobin: 13.3 g/dL (ref 12.0–15.0)
Lymphs Abs: 2.4 10*3/uL (ref 0.7–4.0)
Monocytes Relative: 6.7 % (ref 3.0–12.0)
Neutro Abs: 5.9 10*3/uL (ref 1.4–7.7)
WBC: 9.1 10*3/uL (ref 4.5–10.5)

## 2012-04-21 LAB — LIPID PANEL
Cholesterol: 206 mg/dL — ABNORMAL HIGH (ref 0–200)
Total CHOL/HDL Ratio: 6

## 2012-04-21 LAB — COMPREHENSIVE METABOLIC PANEL
ALT: 33 U/L (ref 0–35)
CO2: 27 mEq/L (ref 19–32)
Calcium: 8.8 mg/dL (ref 8.4–10.5)
Chloride: 102 mEq/L (ref 96–112)
Creatinine, Ser: 0.7 mg/dL (ref 0.4–1.2)
GFR: 97.7 mL/min (ref >60.00)
Glucose, Bld: 95 mg/dL (ref 70–99)

## 2012-04-21 LAB — MICROALBUMIN / CREATININE URINE RATIO: Microalb, Ur: 0.3 mg/dL (ref 0.0–1.9)

## 2012-04-21 MED ORDER — ALPRAZOLAM 1 MG PO TABS: ORAL_TABLET | ORAL | Status: DC

## 2012-04-21 NOTE — Assessment & Plan Note (Signed)
Symptoms well controlled with alprazolam and Prozac. Will continue. Followup in 6 months or sooner as needed.

## 2012-04-21 NOTE — Assessment & Plan Note (Signed)
General medical exam normal today including breast exam. PAP deferred as patient's Pap was normal last year. Plan to repeat PAP in 2014, given pt h/o abnormal PAP in the past. We discussed that Medicare will cover PAP every other year. Will check labs today including CBC, CMP, lipid profile. Vaccinations are up-to-date. Mammogram is pending today. Colonoscopy is up-to-date. Encouraged healthier diet with limitation of processed foods  And regular physical activity, and setting a goal of walking 30 minutes 5 days per week. Followup in 6 months or sooner as needed.

## 2012-04-21 NOTE — Progress Notes (Signed)
Subjective:    Patient ID: Brittney Ruiz, female    DOB: 06/20/1965, 46 y.o.   MRN: [id number]  HPI The patient is here for annual Medicare wellness examination and management of other chronic and acute problems.   The risk factors are reflected in the social history.  The roster of all physicians providing medical care to patient - is listed in the Snapshot section of the chart.  Activities of daily living:  The patient is 100% independent in all ADLs: dressing, toileting, feeding as well as independent mobility  Home safety : The patient has smoke detectors in the home. They wear seatbelts.  There are no firearms at home. There is no violence in the home.   There is no risks for hepatitis, STDs or HIV. There is no history of blood transfusion. They have no travel history to infectious disease endemic areas of the world.  The patient has seen their dentist in the last six month. (Dr. Daily) They have not seen their eye doctor in the last year. [facility]) They have deferred audiologic testing in the last year.  No issues with hearing.  They do not  have excessive sun exposure. Discussed the need for sun protection: hats, long sleeves and use of sunscreen if there is significant sun exposure.  Diet: the importance of a healthy diet is discussed. They do not have a healthy diet. Encouraged limited intake of excess salt, processed carbohydrates.  The benefits of regular aerobic exercise were discussed. She does not exercise. Encouraged setting goal of walking 5 days per week.  Depression screen: there are no signs or vegative symptoms of depression- irritability, change in appetite, anhedonia, sadness/tearfullness. Some problems with anxiety, controlled with meds.  Cognitive assessment: the patient manages all their financial and personal affairs and is actively engaged. They could relate day,date,year and events.  The following portions of the patient's history were  reviewed and updated as appropriate: allergies, current medications, past family history, past medical history,  past surgical history, past social history  and problem list.  Visual acuity was not assessed per patient preference since she has regular follow up with her ophthalmologist. Hearing and body mass index were assessed and reviewed.   During the course of the visit the patient was educated and counseled about appropriate screening and preventive services including : fall prevention , diabetes screening, nutrition counseling, colorectal cancer screening, and recommended immunizations.     Outpatient Encounter Prescriptions as of 04/21/2012  Medication Sig Dispense Refill  . ACETAMINOPHEN PO Take by mouth as needed.        . ALPRAZolam (XANAX) 1 MG tablet Take one/half tablet in morning, one half tablet at 11:00 am, one half tablet at 3:00 pm, one half Tablet at 7:00 pm, and 1 tablet at bedtime  90 tablet  0  . cetirizine (ZYRTEC) 10 MG tablet Take 0.5 tablets (5 mg total) by mouth at bedtime.  30 tablet  6  . Docusate Calcium (STOOL SOFTENER PO) Take 1 tablet by mouth as needed.        . fexofenadine (ALLER-EASE) 180 MG tablet Take 1 tablet (180 mg total) by mouth daily.  30 tablet  6  . FLUoxetine (PROZAC) 10 MG capsule TAKE 1 CAPSULE BY MOUTH EVERY DAY  90 capsule  3  . fluticasone (FLONASE) 50 MCG/ACT nasal spray Place 2 sprays into the nose daily.  16 g  5  . ranitidine (ZANTAC) 150 MG tablet Take 1 tablet in the morning  and 2 tablets in the evening.  90 tablet  3  . Spacer/Aero Chamber Mouthpiece MISC 1 puff by Does not apply route as needed.  1 each  1  . [DISCONTINUED] ALPRAZolam (XANAX) 1 MG tablet Take one/half tablet in morning, one half tablet at 11:00 am, one half tablet at 3:00 pm, one half Tablet at 7:00 pm, and 1 tablet at bedtime  90 tablet  0  . albuterol (PROAIR HFA) 108 (90 BASE) MCG/ACT inhaler Inhale 2 puffs into the lungs every 6 (six) hours as needed for wheezing.  1  Inhaler  3   BP 126/82  Pulse 84  Temp 98.2 F (36.8 C) (Oral)  Resp 16  Ht 5' 2.5" (1.588 m)  Wt 211 lb 8 oz (95.936 kg)  BMI 38.07 kg/m2  SpO2 96%  LMP 03/31/2012  Review of Systems  Constitutional: Negative for fever, chills, appetite change, fatigue and unexpected weight change.  HENT: Negative for ear pain, congestion, sore throat, trouble swallowing, neck pain, voice change and sinus pressure.   Eyes: Negative for visual disturbance.  Respiratory: Negative for cough, shortness of breath, wheezing and stridor.   Cardiovascular: Negative for chest pain, palpitations and leg swelling.  Gastrointestinal: Negative for nausea, vomiting, abdominal pain, diarrhea, constipation, blood in stool, abdominal distention and anal bleeding.  Genitourinary: Negative for dysuria and flank pain.  Musculoskeletal: Negative for myalgias, arthralgias and gait problem.  Skin: Negative for color change and rash.  Neurological: Negative for dizziness and headaches.  Hematological: Negative for adenopathy. Does not bruise/bleed easily.  Psychiatric/Behavioral: Negative for suicidal ideas, sleep disturbance and dysphoric mood. The patient is nervous/anxious.        Objective:   Physical Exam  Constitutional: She is oriented to person, place, and time. She appears well-developed and well-nourished. No distress.  HENT:  Head: Normocephalic and atraumatic.  Right Ear: External ear normal.  Left Ear: External ear normal.  Nose: Nose normal.  Mouth/Throat: Oropharynx is clear and moist. No oropharyngeal exudate.  Eyes: Conjunctivae normal are normal. Pupils are equal, round, and reactive to light. Right eye exhibits no discharge. Left eye exhibits no discharge. No scleral icterus.  Neck: Normal range of motion. Neck supple. No tracheal deviation present. No thyromegaly present.  Cardiovascular: Normal rate, regular rhythm, normal heart sounds and intact distal pulses.  Exam reveals no gallop and no  friction rub.   No murmur heard. Pulmonary/Chest: Effort normal and breath sounds normal. No accessory muscle usage. Not tachypneic. No respiratory distress. She has no decreased breath sounds. She has no wheezes. She has no rhonchi. She has no rales. She exhibits no tenderness. Right breast exhibits no inverted nipple, no mass, no nipple discharge, no skin change and no tenderness. Left breast exhibits no inverted nipple, no mass, no nipple discharge, no skin change and no tenderness. Breasts are symmetrical.  Abdominal: Soft. Bowel sounds are normal. She exhibits no distension and no mass. There is no tenderness. There is no rebound and no guarding.  Musculoskeletal: Normal range of motion. She exhibits no edema and no tenderness.  Lymphadenopathy:    She has no cervical adenopathy.  Neurological: She is alert and oriented to person, place, and time. No cranial nerve deficit. She exhibits normal muscle tone. Coordination normal.  Skin: Skin is warm and dry. No rash noted. She is not diaphoretic. No erythema. No pallor.  Psychiatric: She has a normal mood and affect. Her behavior is normal. Judgment and thought content normal.  Assessment & Plan:

## 2012-05-12 ENCOUNTER — Encounter: Payer: Self-pay | Admitting: Internal Medicine

## 2012-05-19 ENCOUNTER — Other Ambulatory Visit: Payer: Self-pay | Admitting: Internal Medicine

## 2012-05-19 NOTE — Telephone Encounter (Signed)
Fluoxetine 10 mg cap  Take 1 capsule by mouth every day  # 90  Alprazolam 1 mg tab  Take 1/2 tablet by mouth every morning ,1/2 tablet at 11:00 am ,1/2 at 3:00 pm, 1/2 at 7:00 and 1 tablet at bedtime  # 90  There is a form that is in the physician box for this medication.

## 2012-05-20 ENCOUNTER — Other Ambulatory Visit: Payer: Self-pay | Admitting: Internal Medicine

## 2012-05-20 DIAGNOSIS — F419 Anxiety disorder, unspecified: Secondary | ICD-10-CM

## 2012-05-20 NOTE — Telephone Encounter (Signed)
Alprazolam 1 mg tab  Take 1/2 tablet by mouth every morning ,1/2 tablet at 11:00 am ,1/2 at 3:00 ,1/2 at 7:00 pm and 1 tablet at bedtime.  # 90

## 2012-05-23 MED ORDER — FLUOXETINE HCL 10 MG PO CAPS: 10.0000 mg | ORAL_CAPSULE | Freq: Every day | ORAL | Status: DC

## 2012-05-23 MED ORDER — ALPRAZOLAM 1 MG PO TABS: ORAL_TABLET | ORAL | Status: DC

## 2012-05-23 NOTE — Telephone Encounter (Signed)
Fine to fill. 

## 2012-05-23 NOTE — Telephone Encounter (Signed)
Dr. Dan Humphreys this patient is requesting a refill Please advise

## 2012-05-23 NOTE — Telephone Encounter (Signed)
Rx called to [facility] pharmacy. 

## 2012-05-23 NOTE — Telephone Encounter (Signed)
Prozac refill request. Pt last seen on 04/21/12 and med last filled on 06/04/11 for a year. Ok to refill?

## 2012-05-31 ENCOUNTER — Ambulatory Visit (INDEPENDENT_AMBULATORY_CARE_PROVIDER_SITE_OTHER): Payer: Medicare Other | Admitting: Adult Health

## 2012-05-31 ENCOUNTER — Encounter: Payer: Self-pay | Admitting: Adult Health

## 2012-05-31 VITALS — BP 112/82 | HR 76 | Temp 98.1°F | Resp 14 | Ht 62.5 in | Wt 211.5 lb

## 2012-05-31 DIAGNOSIS — J029 Acute pharyngitis, unspecified: Secondary | ICD-10-CM

## 2012-05-31 DIAGNOSIS — J019 Acute sinusitis, unspecified: Secondary | ICD-10-CM | POA: Insufficient documentation

## 2012-05-31 DIAGNOSIS — Z889 Allergy status to unspecified drugs, medicaments and biological substances status: Secondary | ICD-10-CM

## 2012-05-31 DIAGNOSIS — J329 Chronic sinusitis, unspecified: Secondary | ICD-10-CM | POA: Diagnosis not present

## 2012-05-31 DIAGNOSIS — Z9109 Other allergy status, other than to drugs and biological substances: Secondary | ICD-10-CM | POA: Diagnosis not present

## 2012-05-31 DIAGNOSIS — H9209 Otalgia, unspecified ear: Secondary | ICD-10-CM | POA: Diagnosis not present

## 2012-05-31 MED ORDER — AMOXICILLIN-POT CLAVULANATE 875-125 MG PO TABS: 1.0000 | ORAL_TABLET | Freq: Two times a day (BID) | ORAL | Status: DC

## 2012-05-31 MED ORDER — ANTIPYRINE-BENZOCAINE 5.4-1.4 % OT SOLN: 3.0000 [drp] | OTIC | Status: DC | PRN

## 2012-05-31 MED ORDER — EPINEPHRINE 0.3 MG/0.3ML IJ DEVI: 0.3000 mg | Freq: Once | INTRAMUSCULAR | Status: DC

## 2012-05-31 NOTE — Patient Instructions (Addendum)
  Start antibiotic today. Continue with the flonase nasal spray.  Auralgan drops for the ear pain. You can use this every 2 hours as needed.  Continue to drink water to stay hydrated.  I have sent in a prescription for your Epipen. Yours was expired.  Please call if you do not have any improvement within 3-4 days.

## 2012-05-31 NOTE — Progress Notes (Signed)
Subjective:    Patient ID: Brittney Ruiz, female    DOB: 1966-04-17, 47 y.o.   MRN: [id number]  HPI  Patient is a pleasant 47 y/o patient with a hx of anxiety/depression, multiple allergies to medications and foods, hyperlipidemia. She presents to clinic today with c/o entire left side of face painful, swollen, coughing up green phlegm, drainage from sinuses, left ear pain, sore throat and layngitis.  Symptoms began approximately 11 days ago. She also reports having n/v but this has subsided. Patient normally takes Allegra 180 mg daily and Zyrtec 12.5 mg at bedtime. She uses flonase approx 3-4 times per week. She irrigates her sinuses with the Neti pot nightly. Symptoms have not improved.  Current Outpatient Prescriptions on File Prior to Visit  Medication Sig Dispense Refill  . ACETAMINOPHEN PO Take by mouth as needed.        Marland Kitchen albuterol (PROVENTIL HFA;VENTOLIN HFA) 108 (90 BASE) MCG/ACT inhaler Inhale 2 puffs into the lungs every 6 (six) hours as needed.      . ALPRAZolam (XANAX) 1 MG tablet Take one/half tablet in morning, one half tablet at 11:00 am, one half tablet at 3:00 pm, one half Tablet at 7:00 pm, and 1 tablet at bedtime  90 tablet  0  . cetirizine (ZYRTEC) 10 MG tablet Take 0.5 tablets (5 mg total) by mouth at bedtime.  30 tablet  6  . Docusate Calcium (STOOL SOFTENER PO) Take 1 tablet by mouth as needed.        . fexofenadine (ALLER-EASE) 180 MG tablet Take 1 tablet (180 mg total) by mouth daily.  30 tablet  6  . FLUoxetine (PROZAC) 10 MG capsule Take 1 capsule (10 mg total) by mouth daily.  90 capsule  3  . fluticasone (FLONASE) 50 MCG/ACT nasal spray Place 2 sprays into the nose daily.      . ranitidine (ZANTAC) 150 MG tablet Take 1 tablet in the morning and 2 tablets in the evening.  90 tablet  3  . Spacer/Aero Chamber Mouthpiece MISC 1 puff by Does not apply route as needed.  1 each  1    Review of Systems  Constitutional: Positive for fever.  HENT: Positive for ear pain,  congestion, sore throat, rhinorrhea, voice change and sinus pressure. Negative for neck pain, neck stiffness and ear discharge.        Left ear pain.  Eyes: Negative.   Respiratory: Positive for cough and wheezing. Negative for shortness of breath.        Coughing up green phlegm. Patient reports drainage from sinuses.  Cardiovascular: Negative for chest pain and palpitations.  Gastrointestinal: Positive for nausea.  Neurological: Positive for light-headedness and headaches.  Psychiatric/Behavioral: Negative.    BP 112/82  Pulse 76  Temp 98.1 F (36.7 C) (Oral)  Resp 14  Ht 5' 2.5" (1.588 m)  Wt 211 lb 8 oz (95.936 kg)  BMI 38.07 kg/m2  SpO2 94%  LMP 04/30/2012     Objective:   Physical Exam  Constitutional: She is oriented to person, place, and time. She appears well-developed and well-nourished.  HENT:  Head: Normocephalic and atraumatic.  Mouth/Throat: No oropharyngeal exudate.       Erythema of bilateral ear canals. Pharyngeal erythema and irritation. Nasal mucosa injected. Tender maxillary sinuses. Laryngitis.   Neck: No tracheal deviation present.  Cardiovascular: Normal rate, regular rhythm and normal heart sounds.  Exam reveals no gallop and no friction rub.   No murmur heard. Pulmonary/Chest: Effort normal. No respiratory  distress. She has wheezes. She has no rales.  Abdominal: Soft. Bowel sounds are normal.  Musculoskeletal: Normal range of motion.  Lymphadenopathy:    She has no cervical adenopathy.  Neurological: She is alert and oriented to person, place, and time. Coordination normal.  Skin: Skin is warm and dry. No rash noted. No erythema.  Psychiatric: She has a normal mood and affect. Her behavior is normal. Judgment and thought content normal.       Assessment & Plan:

## 2012-05-31 NOTE — Assessment & Plan Note (Signed)
Symptoms ongoing > 10 days without improvement.  Start Augmentin. Use flonase daily while symptoms persist. Continue to flush sinuses daily. RTC if no improvement within 3-4 days.

## 2012-06-23 ENCOUNTER — Other Ambulatory Visit: Payer: Self-pay | Admitting: *Deleted

## 2012-06-23 ENCOUNTER — Other Ambulatory Visit: Payer: Self-pay | Admitting: Internal Medicine

## 2012-06-23 DIAGNOSIS — F419 Anxiety disorder, unspecified: Secondary | ICD-10-CM

## 2012-06-23 MED ORDER — FLUOXETINE HCL 10 MG PO CAPS: 10.0000 mg | ORAL_CAPSULE | Freq: Every day | ORAL | Status: DC

## 2012-06-23 MED ORDER — ALPRAZOLAM 1 MG PO TABS: ORAL_TABLET | ORAL | Status: DC

## 2012-06-23 MED ORDER — FEXOFENADINE HCL 180 MG PO TABS: 180.0000 mg | ORAL_TABLET | Freq: Every day | ORAL | Status: DC

## 2012-06-23 MED ORDER — RANITIDINE HCL 150 MG PO TABS: ORAL_TABLET | ORAL | Status: DC

## 2012-06-23 NOTE — Telephone Encounter (Signed)
Patient called stating the pharmacy told her they have been faxing a refill request on her medication but we have not received anything. Please review and refill if possible.

## 2012-06-23 NOTE — Telephone Encounter (Signed)
RX for Alprazolam and Prozac called in to [facility].  Verified that refills for zantac and fexofenadine were sent electronically.

## 2012-06-24 NOTE — Telephone Encounter (Signed)
Last refilled 06/23/12

## 2012-07-02 ENCOUNTER — Other Ambulatory Visit: Payer: Self-pay

## 2012-07-18 ENCOUNTER — Other Ambulatory Visit: Payer: Self-pay | Admitting: *Deleted

## 2012-07-18 DIAGNOSIS — F419 Anxiety disorder, unspecified: Secondary | ICD-10-CM

## 2012-07-19 MED ORDER — ALPRAZOLAM 1 MG PO TABS: ORAL_TABLET | ORAL | Status: DC

## 2012-07-25 ENCOUNTER — Telehealth: Payer: Self-pay | Admitting: Internal Medicine

## 2012-07-25 NOTE — Telephone Encounter (Signed)
Medication was sent to pharmacy but per pharmacy they did not receive it. Alprazolam was phoned in using the information already loaded on that refill request dated 3/4

## 2012-07-25 NOTE — Telephone Encounter (Signed)
PT CALLED CHECKING ON HER RX FOR ALPRZOLAM PT STATED [facility] DOES NOT HAVE THIS.  PT HAS ONLY 2 DAYS LEFT PT STATED [facility] HAS FAXED AND SENT THIS ELECTRONICLY 4 TIMES PT IS VERY UPSET THAT HER RX IS NOT AT [facility]

## 2012-08-12 ENCOUNTER — Ambulatory Visit (INDEPENDENT_AMBULATORY_CARE_PROVIDER_SITE_OTHER): Payer: Medicare Other | Admitting: Internal Medicine

## 2012-08-12 ENCOUNTER — Encounter: Payer: Self-pay | Admitting: Internal Medicine

## 2012-08-12 VITALS — BP 140/102 | HR 80 | Temp 98.3°F | Wt 200.0 lb

## 2012-08-12 DIAGNOSIS — E785 Hyperlipidemia, unspecified: Secondary | ICD-10-CM | POA: Diagnosis not present

## 2012-08-12 DIAGNOSIS — N920 Excessive and frequent menstruation with regular cycle: Secondary | ICD-10-CM

## 2012-08-12 DIAGNOSIS — R42 Dizziness and giddiness: Secondary | ICD-10-CM

## 2012-08-12 LAB — CBC WITH DIFFERENTIAL/PLATELET
Basophils Absolute: 0 10*3/uL (ref 0.0–0.1)
Basophils Relative: 0 % (ref 0–1)
Hemoglobin: 13.6 g/dL (ref 12.0–15.0)
Lymphocytes Relative: 32 % (ref 12–46)
MCHC: 34.6 g/dL (ref 30.0–36.0)
Monocytes Relative: 6 % (ref 3–12)
Neutro Abs: 4.4 10*3/uL (ref 1.7–7.7)
Neutrophils Relative %: 59 % (ref 43–77)
WBC: 7.4 10*3/uL (ref 4.0–10.5)

## 2012-08-12 NOTE — Assessment & Plan Note (Signed)
Symptoms most consistent with benign positional vertigo. This may be exacerbated by anemia, given ongoing menorrhagia. Will check CBC, TSH , CMP with labs. Will set up PT for vestibular therapy. Unable to use Meclizine because of allergy. Will try using OTC Dramamine. Follow up 4 weeks and prn.

## 2012-08-12 NOTE — Assessment & Plan Note (Signed)
Will recheck lipids and LFTs with labs today. 

## 2012-08-12 NOTE — Progress Notes (Signed)
Subjective:    Patient ID: Brittney Ruiz, female    DOB: 1966-01-14, 47 y.o.   MRN: [id number]  HPI 47YO female with h/o anxiety presents for acute visit. Complains of 2 week h/o vertigo. Symptoms started suddenly after getting out of bed one morning. Symptoms made worse by turning head to side. No change in vision, neurologic deficits noted. Mild nausea. Occasional diarrhea last two days, with watery stool. Symptoms similar to previous episode several years ago which lasted several weeks and required vestibular therapy for remission. Unable to take Meclizine because of allergy.  Also concerned about very heavy menstrual bleeding, occuring irregularly. Soaking large pad every 30-18min for approx 5 days with each cycle.  Cycles occur sometimes q4 weeks and sometimes q1-2 weeks. Denies significant cramping.  Notes she has been exercising with walking and trying to eat a healthier diet in an effort to lose weight and lower cholesterol. She would like to recheck cholesterol today.  Outpatient Encounter Prescriptions as of 08/12/2012  Medication Sig Dispense Refill  . ACETAMINOPHEN PO Take by mouth as needed.        Marland Kitchen albuterol (PROVENTIL HFA;VENTOLIN HFA) 108 (90 BASE) MCG/ACT inhaler Inhale 2 puffs into the lungs every 6 (six) hours as needed.      . ALPRAZolam (XANAX) 1 MG tablet Take one/half tablet in morning, one half tablet at 11:00 am, one half tablet at 3:00 pm, one half Tablet at 7:00 pm, and 1 tablet at bedtime  90 tablet  0  . cetirizine (ZYRTEC) 10 MG tablet Take 0.5 tablets (5 mg total) by mouth at bedtime.  30 tablet  6  . EPINEPHrine (EPIPEN) 0.3 mg/0.3 mL DEVI Inject 0.3 mLs (0.3 mg total) into the muscle once.  1 Device  1  . fexofenadine (ALLER-EASE) 180 MG tablet Take 1 tablet (180 mg total) by mouth daily.  30 tablet  6  . FIBER SELECT GUMMIES PO Take by mouth.      Marland Kitchen FLUoxetine (PROZAC) 10 MG capsule Take 1 capsule (10 mg total) by mouth daily.  90 capsule  3  . fluticasone  (FLONASE) 50 MCG/ACT nasal spray Place 2 sprays into the nose daily.      . ranitidine (ZANTAC) 150 MG tablet Take 1 tablet in the morning and 2 tablets in the evening.  90 tablet  3  . Spacer/Aero Chamber Mouthpiece MISC 1 puff by Does not apply route as needed.  1 each  1  . Docusate Calcium (STOOL SOFTENER PO) Take 1 tablet by mouth as needed.         No facility-administered encounter medications on file as of 08/12/2012.   BP 140/102  Pulse 80  Temp(Src) 98.3 F (36.8 C) (Oral)  Wt 200 lb (90.719 kg)  BMI 35.97 kg/m2  SpO2 98%  LMP 08/12/2012  Review of Systems  Constitutional: Negative for fever, chills, appetite change, fatigue and unexpected weight change.  HENT: Negative for ear pain, congestion, sore throat, trouble swallowing, neck pain, voice change and sinus pressure.   Eyes: Negative for visual disturbance.  Respiratory: Negative for cough, shortness of breath, wheezing and stridor.   Cardiovascular: Negative for chest pain, palpitations and leg swelling.  Gastrointestinal: Positive for diarrhea. Negative for nausea, vomiting, abdominal pain, constipation, blood in stool, abdominal distention and anal bleeding.  Genitourinary: Positive for menstrual problem. Negative for dysuria and flank pain.  Musculoskeletal: Negative for myalgias, arthralgias and gait problem.  Skin: Negative for color change and rash.  Neurological: Positive for dizziness.  Negative for tremors, syncope, weakness and headaches.  Hematological: Negative for adenopathy. Does not bruise/bleed easily.  Psychiatric/Behavioral: Negative for suicidal ideas, sleep disturbance and dysphoric mood. The patient is not nervous/anxious.        Objective:   Physical Exam  Constitutional: She is oriented to person, place, and time. She appears well-developed and well-nourished. No distress.  HENT:  Head: Normocephalic and atraumatic.  Right Ear: External ear normal. A middle ear effusion is present.  Left Ear:  External ear normal. A middle ear effusion is present.  Nose: Nose normal.  Mouth/Throat: Oropharynx is clear and moist. No oropharyngeal exudate.  Eyes: Conjunctivae are normal. Pupils are equal, round, and reactive to light. Right eye exhibits no discharge. Left eye exhibits no discharge. No scleral icterus.  Neck: Normal range of motion. Neck supple. No tracheal deviation present. No thyromegaly present.  Cardiovascular: Normal rate, regular rhythm, normal heart sounds and intact distal pulses.  Exam reveals no gallop and no friction rub.   No murmur heard. Pulmonary/Chest: Effort normal and breath sounds normal. No respiratory distress. She has no wheezes. She has no rales. She exhibits no tenderness.  Musculoskeletal: Normal range of motion. She exhibits no edema and no tenderness.  Lymphadenopathy:    She has no cervical adenopathy.  Neurological: She is alert and oriented to person, place, and time. No cranial nerve deficit. She exhibits normal muscle tone. Coordination normal.  Skin: Skin is warm and dry. No rash noted. She is not diaphoretic. No erythema. No pallor.  Psychiatric: She has a normal mood and affect. Her behavior is normal. Judgment and thought content normal.          Assessment & Plan:

## 2012-08-12 NOTE — Assessment & Plan Note (Signed)
Irregular menses and menorrhagia most consistent with perimenopause. Will check CBC and ferritin with labs. Discussed starting OCP if symptoms persistent. May also need to consider endometrial ablation if symptoms persistent. Follow up 4 weeks and prn.

## 2012-08-13 LAB — COMPREHENSIVE METABOLIC PANEL
AST: 30 U/L (ref 0–37)
Albumin: 4.4 g/dL (ref 3.5–5.2)
Alkaline Phosphatase: 57 U/L (ref 39–117)
Glucose, Bld: 65 mg/dL — ABNORMAL LOW (ref 70–99)
Potassium: 4.7 mEq/L (ref 3.5–5.3)
Sodium: 139 mEq/L (ref 135–145)
Total Protein: 6.4 g/dL (ref 6.0–8.3)

## 2012-08-13 LAB — LIPID PANEL
HDL: 24 mg/dL — ABNORMAL LOW (ref >39)
Total CHOL/HDL Ratio: 8 Ratio

## 2012-08-13 LAB — FERRITIN: Ferritin: 111 ng/mL (ref 10–291)

## 2012-08-17 ENCOUNTER — Other Ambulatory Visit: Payer: Self-pay | Admitting: *Deleted

## 2012-08-17 DIAGNOSIS — R279 Unspecified lack of coordination: Secondary | ICD-10-CM | POA: Diagnosis not present

## 2012-08-17 DIAGNOSIS — R42 Dizziness and giddiness: Secondary | ICD-10-CM | POA: Diagnosis not present

## 2012-08-17 DIAGNOSIS — F419 Anxiety disorder, unspecified: Secondary | ICD-10-CM

## 2012-08-17 MED ORDER — ALPRAZOLAM 1 MG PO TABS: ORAL_TABLET | ORAL | Status: DC

## 2012-08-29 ENCOUNTER — Telehealth: Payer: Self-pay | Admitting: Internal Medicine

## 2012-08-29 NOTE — Telephone Encounter (Signed)
Pt is calling and wanting to know if she could have a referral to ENT for her Vertigo instead of physical therapy. She says the therapy is making it much worse.

## 2012-08-29 NOTE — Telephone Encounter (Signed)
Fwd. To Dr. Dan Humphreys

## 2012-08-29 NOTE — Telephone Encounter (Signed)
Fine to set up referral to ENT

## 2012-08-29 NOTE — Telephone Encounter (Signed)
Patient has an appointment @ [facility] on 4/17 @ 915 with Willeen Cass

## 2012-09-01 DIAGNOSIS — H9319 Tinnitus, unspecified ear: Secondary | ICD-10-CM | POA: Diagnosis not present

## 2012-09-01 DIAGNOSIS — R42 Dizziness and giddiness: Secondary | ICD-10-CM | POA: Diagnosis not present

## 2012-09-01 DIAGNOSIS — J0100 Acute maxillary sinusitis, unspecified: Secondary | ICD-10-CM | POA: Diagnosis not present

## 2012-09-01 DIAGNOSIS — H903 Sensorineural hearing loss, bilateral: Secondary | ICD-10-CM | POA: Diagnosis not present

## 2012-09-06 DIAGNOSIS — H8110 Benign paroxysmal vertigo, unspecified ear: Secondary | ICD-10-CM | POA: Diagnosis not present

## 2012-09-13 DIAGNOSIS — R42 Dizziness and giddiness: Secondary | ICD-10-CM | POA: Diagnosis not present

## 2012-09-14 DIAGNOSIS — R42 Dizziness and giddiness: Secondary | ICD-10-CM | POA: Diagnosis not present

## 2012-09-14 DIAGNOSIS — J0100 Acute maxillary sinusitis, unspecified: Secondary | ICD-10-CM | POA: Diagnosis not present

## 2012-09-20 ENCOUNTER — Other Ambulatory Visit: Payer: Self-pay | Admitting: *Deleted

## 2012-09-20 MED ORDER — FLUOXETINE HCL 10 MG PO CAPS: 10.0000 mg | ORAL_CAPSULE | Freq: Every day | ORAL | Status: DC

## 2012-09-20 NOTE — Telephone Encounter (Signed)
Eprescribed.

## 2012-09-22 ENCOUNTER — Ambulatory Visit (INDEPENDENT_AMBULATORY_CARE_PROVIDER_SITE_OTHER): Payer: Medicare Other | Admitting: Adult Health

## 2012-09-22 ENCOUNTER — Telehealth: Payer: Self-pay | Admitting: Internal Medicine

## 2012-09-22 ENCOUNTER — Encounter: Payer: Self-pay | Admitting: Adult Health

## 2012-09-22 VITALS — BP 126/84 | HR 74 | Temp 98.1°F | Resp 12 | Wt 199.0 lb

## 2012-09-22 DIAGNOSIS — N926 Irregular menstruation, unspecified: Secondary | ICD-10-CM | POA: Diagnosis not present

## 2012-09-22 DIAGNOSIS — F32A Depression, unspecified: Secondary | ICD-10-CM

## 2012-09-22 DIAGNOSIS — R42 Dizziness and giddiness: Secondary | ICD-10-CM | POA: Diagnosis not present

## 2012-09-22 DIAGNOSIS — B372 Candidiasis of skin and nail: Secondary | ICD-10-CM

## 2012-09-22 DIAGNOSIS — F329 Major depressive disorder, single episode, unspecified: Secondary | ICD-10-CM | POA: Diagnosis not present

## 2012-09-22 DIAGNOSIS — N920 Excessive and frequent menstruation with regular cycle: Secondary | ICD-10-CM

## 2012-09-22 DIAGNOSIS — F419 Anxiety disorder, unspecified: Secondary | ICD-10-CM

## 2012-09-22 DIAGNOSIS — F411 Generalized anxiety disorder: Secondary | ICD-10-CM

## 2012-09-22 DIAGNOSIS — F3289 Other specified depressive episodes: Secondary | ICD-10-CM

## 2012-09-22 MED ORDER — NYSTATIN-TRIAMCINOLONE 100000-0.1 UNIT/GM-% EX CREA: TOPICAL_CREAM | Freq: Two times a day (BID) | CUTANEOUS | Status: DC

## 2012-09-22 MED ORDER — FLUTICASONE PROPIONATE 50 MCG/ACT NA SUSP: 2.0000 | Freq: Every day | NASAL | Status: DC

## 2012-09-22 MED ORDER — FLUOXETINE HCL 20 MG PO TABS: 20.0000 mg | ORAL_TABLET | Freq: Every day | ORAL | Status: DC

## 2012-09-22 NOTE — Patient Instructions (Addendum)
  I am referring you to a GYN for your irregular menstrual cycles and increased frequency.  I am increasing your prozac to 20 mg daily.  I have also ordered nystatin cream for under your breast.  Follow up with ENT

## 2012-09-22 NOTE — Assessment & Plan Note (Addendum)
Increase Prozac to 20 mg daily. I think the dose increase may help with her anxiety symptoms. I have also discussed her seeing a psychiatrist. She was seen a physician in [place] however the visits became very expensive and she was unable to afford those. Discussed psychiatric services through [facility]. Patient's increase in symptoms I believe are directly related to her home situation. She really needs someone to be able to talk to on a regular basis as she states that she is not able to vent. Note, greater than 60 min were spent in face to face communication with patient in the assessment, planning and implementation of care.

## 2012-09-22 NOTE — Assessment & Plan Note (Signed)
Metromenorrhagia very disturbing to patient. Refer to GYN for further evaluation and management.

## 2012-09-22 NOTE — Progress Notes (Signed)
Subjective:    Patient ID: Brittney Ruiz, female    DOB: [birth year], 47 y.o.   MRN: [id number]  HPI  Patient is a pleasant 47 year old female who presents to clinic with the following concerns:  1) Vertigo - is being followed by ENT for severe ongoing symptoms (8 weeks). She is just completing a course of Levaquin for sinus infection which was believed to be contributing to the incomplete resolution of her symptoms. She is unable to look down 2/2 vertigo. She is not driving and is feeling incapacitated.   2) Increased anxiety/depression - patient is currently on prozac 10 mg and xanax every 4 hours. She feels that the xanax is not holding her for the full 4 hours. Her husband left her this past Sunday "because she was sick for too long". She is having increasing episodes of anxiety/panic attacks. She reports her last panic attack was approximately 4-5 years ago. Pt also has other family situations which are contributing her her increased symptoms.  3) Metromenorrhagia - she is having periods lasting > 10 days then have another period within 2 weeks. She reports spotting in between. She thinks she is having hormonal problems which is not only causing the irregular bleeding but contributing to her moods. She  also reports decreased libido.   4) Yeast infection underneath both breasts. She report foul odor underneath her breasts. She has been applying antibiotic ointment and this has helped slightly. She reports this usually happens when she does not wear a bra and her breast lie on her chest.   Past Medical History  Diagnosis Date  . Depression   . Allergy   . Anxiety   . Obesity (BMI 30-39.9)   . Vaginal delivery     X 2    Past Surgical History  Procedure Laterality Date  . Skin cancer excision      melanoma  . Leep      Family History  Problem Relation Age of Onset  . Hyperlipidemia Mother   . Hypertension Mother   . Cancer Father     lung  . Depression Father   .  Hyperlipidemia Sister   . Hypertension Sister   . Migraines Sister   . Hypertension Brother   . Hyperlipidemia Brother   . Migraines Brother   . Cancer Maternal Grandmother     brain  . Depression Maternal Grandfather   . Heart disease Maternal Grandfather   . Cancer Paternal Grandfather     unknown    History   Social History  . Marital Status: Married    Spouse Name: N/A    Number of Children: N/A  . Years of Education: N/A   Occupational History  . Not on file.   Social History Main Topics  . Smoking status: Former Smoker    Quit date: 03/09/2008  . Smokeless tobacco: Not on file  . Alcohol Use: No  . Drug Use: No  . Sexually Active: Not on file   Other Topics Concern  . Not on file   Social History Narrative  . No narrative on file    Review of Systems  Constitutional: Positive for fatigue. Negative for fever, chills and appetite change.  HENT: Positive for sinus pressure.   Respiratory:       Shortness of breath with anxiety  Cardiovascular: Negative.   Gastrointestinal: Negative.   Genitourinary: Positive for menstrual problem. Negative for dysuria, frequency, flank pain, difficulty urinating and pelvic pain.  Musculoskeletal: Negative.   Skin:  Yeast underneath bil breasts.  Neurological: Positive for dizziness and headaches.       Severe vertigo especially when looking down.  Hematological: Negative.   Psychiatric/Behavioral: Positive for sleep disturbance. Negative for hallucinations, behavioral problems, confusion and agitation. The patient is nervous/anxious.        Increased depression, panic attacks.       Objective:   Physical Exam  Constitutional: She is oriented to person, place, and time. She appears well-developed and well-nourished.  Crying throughout visit. Appears emotionally distraught.   HENT:  Head: Normocephalic and atraumatic.  Eyes: Conjunctivae and EOM are normal. Pupils are equal, round, and reactive to light.   Cardiovascular: Normal rate and regular rhythm.   Pulmonary/Chest: Effort normal. No respiratory distress.  Abdominal: Soft. Bowel sounds are normal.  Musculoskeletal: Normal range of motion.  Lymphadenopathy:    She has no cervical adenopathy.  Neurological: She is alert and oriented to person, place, and time.  Guarded gait 2/2 vertigo  Skin: No rash noted. There is erythema.  Slight erythema underneath bilateral breasts.  Psychiatric: Judgment and thought content normal.  Appears sad; crying throughout visit.          Assessment & Plan:

## 2012-09-22 NOTE — Assessment & Plan Note (Signed)
Start Mycolog cream underneath both breasts twice a day. Once the area is healed she may use cornstarch powder. Discussed that the area becomes very moist from her breast laying on her chest. She should probably use a bra as well for support.

## 2012-09-22 NOTE — Telephone Encounter (Signed)
Patient Information:  Caller Name: Brittney Ruiz  Phone: [phone]  Patient: Brittney Ruiz  Gender: Female  DOB: March 28, 1966  Age: 47 Years  PCP: Ronna Polio (Adults only)  Pregnant: No  Office Follow Up:  Does the office need to follow up with this patient?: No  Instructions For The Office: N/A   Symptoms  Reason For Call & Symptoms: Reports dizziness/vertigo issues. Has seen ENT doctor and has taken 2 antibiotics for sinus infection. Reports that the dizziness/vertigo is still present. Also reports she is having increasing anxiety and crying spells.  Reviewed Health History In EMR: Yes  Reviewed Medications In EMR: Yes  Reviewed Allergies In EMR: Yes  Reviewed Surgeries / Procedures: Yes  Date of Onset of Symptoms: 09/08/2012 OB / GYN:  LMP: 08/24/2012  Guideline(s) Used:  Dizziness  Disposition Per Guideline:   Go to Office Now  Reason For Disposition Reached:   Lightheadedness (dizziness) present now, after 2 hours of rest and fluids  Advice Given:  N/A  Patient Will Follow Care Advice:  YES  Appointment Scheduled:  09/22/2012 16:15:00 Appointment Scheduled Provider:  Orville Govern

## 2012-09-22 NOTE — Assessment & Plan Note (Signed)
Increasing episodes of anxiety despite taking anxiolytic every 4 hours. I think her symptoms are directly related to current situation. Her husband left her this past Sunday. There is also some conflict going on between her husband and her son. No changes made to Xanax.

## 2012-09-22 NOTE — Assessment & Plan Note (Signed)
Patient is currently being followed by ENT. Completing course of Levaquin for sinus infection which was thought to be contributing to the vertigo. Patient needs to followup with ENT for further testing since her symptoms are not completely resolved.

## 2012-09-22 NOTE — Telephone Encounter (Signed)
Patient is coming in to see Raquel at 4:15 today

## 2012-09-26 ENCOUNTER — Encounter: Payer: Self-pay | Admitting: Emergency Medicine

## 2012-09-28 ENCOUNTER — Other Ambulatory Visit: Payer: Self-pay | Admitting: *Deleted

## 2012-09-28 DIAGNOSIS — F419 Anxiety disorder, unspecified: Secondary | ICD-10-CM

## 2012-09-28 MED ORDER — ALPRAZOLAM 1 MG PO TABS: ORAL_TABLET | ORAL | Status: DC

## 2012-09-29 ENCOUNTER — Other Ambulatory Visit: Payer: Self-pay | Admitting: Adult Health

## 2012-09-29 ENCOUNTER — Telehealth: Payer: Self-pay | Admitting: *Deleted

## 2012-09-29 DIAGNOSIS — F419 Anxiety disorder, unspecified: Secondary | ICD-10-CM

## 2012-09-29 DIAGNOSIS — J301 Allergic rhinitis due to pollen: Secondary | ICD-10-CM | POA: Diagnosis not present

## 2012-09-29 DIAGNOSIS — R42 Dizziness and giddiness: Secondary | ICD-10-CM | POA: Diagnosis not present

## 2012-09-29 DIAGNOSIS — J320 Chronic maxillary sinusitis: Secondary | ICD-10-CM | POA: Diagnosis not present

## 2012-09-29 NOTE — Telephone Encounter (Signed)
Pt advised, will stay on current Prozac dose for another week, will report if anxiety persists. Would like a referral to a psychiatrist, [facility] due to her financial situation.

## 2012-09-29 NOTE — Telephone Encounter (Signed)
She is not on highest dose of Prozac. We can increase further. The adjustments take a while to kick in (2-3 weeks). Anxiety is often helped by exercise. I know she has been experiencing vertigo but she might try a stationary bicycle. If she would like to be referred to a Psychiatrist we can do that as well.

## 2012-09-29 NOTE — Telephone Encounter (Signed)
Spoke with patient, continues to have increased anxiety, even on increased dose of Prozac. Continues to have dizziness, which is contributing to her increased anxiety. Has an appointment with ENT this afternoon for followup on dizziness. She states ENT told her the vertigo is gone, but she does not know why she remains dizzy. She has an appointment with Dr. Luella Cook on Monday to discuss perimenopausal symptoms. She is asking what she can do to help her anxiety, states she does not currently see a counselor or psychiatrist. States she has "used every SSRI there is".

## 2012-09-29 NOTE — Telephone Encounter (Signed)
Patient stated she is not feeling any better after Brittney Ruiz increased her Prozac, she is actually feeling worse. She has been on the increased dose for 6 days.

## 2012-10-03 DIAGNOSIS — N920 Excessive and frequent menstruation with regular cycle: Secondary | ICD-10-CM | POA: Diagnosis not present

## 2012-10-03 DIAGNOSIS — Z1151 Encounter for screening for human papillomavirus (HPV): Secondary | ICD-10-CM | POA: Diagnosis not present

## 2012-10-03 DIAGNOSIS — Z01419 Encounter for gynecological examination (general) (routine) without abnormal findings: Secondary | ICD-10-CM | POA: Diagnosis not present

## 2012-10-03 DIAGNOSIS — F39 Unspecified mood [affective] disorder: Secondary | ICD-10-CM | POA: Diagnosis not present

## 2012-10-05 DIAGNOSIS — F39 Unspecified mood [affective] disorder: Secondary | ICD-10-CM | POA: Diagnosis not present

## 2012-10-06 ENCOUNTER — Emergency Department: Payer: Self-pay | Admitting: Emergency Medicine

## 2012-10-06 ENCOUNTER — Telehealth: Payer: Self-pay | Admitting: Internal Medicine

## 2012-10-06 DIAGNOSIS — Z9889 Other specified postprocedural states: Secondary | ICD-10-CM | POA: Diagnosis not present

## 2012-10-06 DIAGNOSIS — F411 Generalized anxiety disorder: Secondary | ICD-10-CM | POA: Diagnosis not present

## 2012-10-06 DIAGNOSIS — J45909 Unspecified asthma, uncomplicated: Secondary | ICD-10-CM | POA: Diagnosis not present

## 2012-10-06 DIAGNOSIS — R0789 Other chest pain: Secondary | ICD-10-CM | POA: Diagnosis not present

## 2012-10-06 DIAGNOSIS — Z8249 Family history of ischemic heart disease and other diseases of the circulatory system: Secondary | ICD-10-CM | POA: Diagnosis not present

## 2012-10-06 DIAGNOSIS — E669 Obesity, unspecified: Secondary | ICD-10-CM | POA: Diagnosis not present

## 2012-10-06 DIAGNOSIS — G4730 Sleep apnea, unspecified: Secondary | ICD-10-CM | POA: Diagnosis not present

## 2012-10-06 DIAGNOSIS — T887XXA Unspecified adverse effect of drug or medicament, initial encounter: Secondary | ICD-10-CM | POA: Diagnosis not present

## 2012-10-06 DIAGNOSIS — R52 Pain, unspecified: Secondary | ICD-10-CM | POA: Diagnosis not present

## 2012-10-06 DIAGNOSIS — R079 Chest pain, unspecified: Secondary | ICD-10-CM | POA: Diagnosis not present

## 2012-10-06 DIAGNOSIS — Z79899 Other long term (current) drug therapy: Secondary | ICD-10-CM | POA: Diagnosis not present

## 2012-10-06 LAB — COMPREHENSIVE METABOLIC PANEL
Albumin: 4.2 g/dL (ref 3.4–5.0)
Alkaline Phosphatase: 64 U/L (ref 50–136)
Anion Gap: 6 — ABNORMAL LOW (ref 7–16)
BUN: 7 mg/dL (ref 7–18)
Bilirubin,Total: 0.6 mg/dL (ref 0.2–1.0)
Calcium, Total: 9.3 mg/dL (ref 8.5–10.1)
Chloride: 104 mmol/L (ref 98–107)
Co2: 26 mmol/L (ref 21–32)
EGFR (Non-African Amer.): >60
Glucose: 114 mg/dL — ABNORMAL HIGH (ref 65–99)
SGPT (ALT): 27 U/L (ref 12–78)
Sodium: 136 mmol/L (ref 136–145)
Total Protein: 7.3 g/dL (ref 6.4–8.2)

## 2012-10-06 LAB — CBC
HCT: 44.3 % (ref 35.0–47.0)
HGB: 15.4 g/dL (ref 12.0–16.0)
MCH: 29.8 pg (ref 26.0–34.0)
MCHC: 34.7 g/dL (ref 32.0–36.0)
Platelet: 246 10*3/uL (ref 150–440)
WBC: 9.4 10*3/uL (ref 3.6–11.0)

## 2012-10-06 LAB — TROPONIN I
Troponin-I: <0.02 ng/mL
Troponin-I: <0.02 ng/mL

## 2012-10-06 NOTE — Telephone Encounter (Signed)
Pt called, went to see Dr. Ladona Ridgel at [facility] yesterday, and she was switched to Klonopin from Xanax, was given Klonopin 1 mg. She took 1/2 tablet at noon yesterday, 1/4 tablet at 1600, and 1/4 tablet at 2030. At 2230, pt began to tremble, have chest pain and increased anxiety. States she was seen in ED early this morning with negative cardiac workup, was told to stop klonopin and resume Xanax. Today, she took her 1/2 tablet of Xanax at 1000. Pt phoned the office at 1200, stating it was effective for less than 2 hours and was having increased panic and heart rate again. She had called [facility] and was waiting for a call back from a psychiatrist. I spoke with Dr. Dan Humphreys and advised pt to take another 1/2 Xanax and to call [facility] again if she did not receive a call back soon. Advised to go to ED if heart rate continues to be elevated or with chest pain or worsening symptoms.

## 2012-10-06 NOTE — Telephone Encounter (Signed)
Just an FYI

## 2012-10-06 NOTE — Telephone Encounter (Signed)
Patient Information:  Caller Name: Jorita  Phone: [phone]  Patient: Brittney, Ruiz  Gender: Female  DOB: 1965/10/27  Age: 47 Years  PCP: Ronna Polio (Adults only)  Pregnant: No  Office Follow Up:  Does the office need to follow up with this patient?: No  Instructions For The Office: N/A  RN Note:  Pt was switched from Xanax to Klonipin by [facility] per PT.  Pt started having CP, SOB, Pt was seen in ED on 5-21, diagnosed w/ Anxiety. Advised to stop Klonipin and go back to Xanax.  Per Pt the Xanax is not helping her anxiety level.  Advised Pt to call 911 due to current Chest Pain w/ history of high cholesterol.  Pt verbalized understanding but request appt w/ Dr Dan Humphreys to discuss her meds.  Appt scheduled on 5-23 at 1345.   Symptoms  Reason For Call & Symptoms: ER CALL. F/U Anxiety Visit in ED, HR 114 w/ Chest Pain  Reviewed Health History In EMR: N/A  Reviewed Medications In EMR: N/A  Reviewed Allergies In EMR: N/A  Reviewed Surgeries / Procedures: N/A  Date of Onset of Symptoms: 10/05/2012  Treatments Tried: Xanax  Treatments Tried Worked: No OB / GYN:  LMP: 10/04/2012  Guideline(s) Used:  Chest Pain  Disposition Per Guideline:   Call EMS 911 Now  Reason For Disposition Reached:   Chest pain lasting longer than 5 minutes and ANY of the following:  Over 33 years old Over 58 years old and at least one cardiac risk factor (i.e., high blood pressure, diabetes, high cholesterol, obesity, smoker or strong family history of heart disease) Pain is crushing, pressure-like, or heavy  Took nitroglycerin and chest pain was not relieved History of heart disease (i.e., angina, heart attack, bypass surgery, angioplasty, CHF)  Advice Given:  N/A  Patient Will Follow Care Advice:  YES

## 2012-10-07 ENCOUNTER — Encounter: Payer: Self-pay | Admitting: Internal Medicine

## 2012-10-07 ENCOUNTER — Ambulatory Visit (INDEPENDENT_AMBULATORY_CARE_PROVIDER_SITE_OTHER): Payer: Medicare Other | Admitting: Internal Medicine

## 2012-10-07 VITALS — BP 140/106 | HR 87 | Temp 98.3°F | Wt 187.0 lb

## 2012-10-07 DIAGNOSIS — R42 Dizziness and giddiness: Secondary | ICD-10-CM

## 2012-10-07 DIAGNOSIS — F411 Generalized anxiety disorder: Secondary | ICD-10-CM

## 2012-10-07 DIAGNOSIS — F419 Anxiety disorder, unspecified: Secondary | ICD-10-CM

## 2012-10-07 DIAGNOSIS — J0100 Acute maxillary sinusitis, unspecified: Secondary | ICD-10-CM | POA: Diagnosis not present

## 2012-10-07 MED ORDER — AMOXICILLIN-POT CLAVULANATE 875-125 MG PO TABS: 1.0000 | ORAL_TABLET | Freq: Two times a day (BID) | ORAL | Status: DC

## 2012-10-07 NOTE — Assessment & Plan Note (Signed)
Symptoms of vertigo secondary to BPPV, likely with exacerbation from recent sinus infection. S/p PT and Epley maneuver. Will treat sinus infection and monitor symptoms. Follow up 1 week.

## 2012-10-07 NOTE — Assessment & Plan Note (Signed)
Symptoms and exam are consistent with acute maxillary sinusitis. No improvement with Levaquin. Will change to Augmentin as this has worked well for her in the past. If no improvement, would consider Clindamycin versus Doxycycline for better Staph coverage. May ultimately need repeat ENT evaluation if symptoms persist.

## 2012-10-07 NOTE — Progress Notes (Signed)
Subjective:    Patient ID: Brittney Ruiz, female    DOB: 1965/09/06, 47 y.o.   MRN: [id number]  HPI 47 year old female with history of generalized anxiety disorder with panic attacks presents for acute visit.  Anxiety - recent change from Alprazolam to Clonazepam by psychiatrist. After taking Clonopin patient experienced rapid heart rate, left-sided chest pressure and facial numbness. She was evaluated in the emergency room x2 and workup including evaluation for MI was negative.Symptoms were thought secondary to anxiety/panic. Pt was placed back on Alprazolam with some improvement.  Sinusitis - Recently seen by ENT. Treated with 30 days of Levaquin with no improvement in sinus pressure, purulent nasal drainage, dizziness.  Vertigo - Recently seen by PT and underwent Epley maneuver with some improvement. Continues to have some intermittent episodes of vertigo. This is associated with significant anxiety.  Outpatient Encounter Prescriptions as of 10/07/2012  Medication Sig Dispense Refill  . ACETAMINOPHEN PO Take by mouth as needed.        Marland Kitchen albuterol (PROVENTIL HFA;VENTOLIN HFA) 108 (90 BASE) MCG/ACT inhaler Inhale 2 puffs into the lungs every 6 (six) hours as needed.      . ALPRAZolam (XANAX) 1 MG tablet Take one/half tablet in morning, one half tablet at 11:00 am, one half tablet at 3:00 pm, one half Tablet at 7:00 pm, and 1 tablet at bedtime  90 tablet  0  . cetirizine (ZYRTEC) 10 MG tablet Take 0.5 tablets (5 mg total) by mouth at bedtime.  30 tablet  6  . Docusate Calcium (STOOL SOFTENER PO) Take 1 tablet by mouth as needed.        Marland Kitchen EPINEPHrine (EPIPEN) 0.3 mg/0.3 mL DEVI Inject 0.3 mLs (0.3 mg total) into the muscle once.  1 Device  1  . fexofenadine (ALLER-EASE) 180 MG tablet Take 1 tablet (180 mg total) by mouth daily.  30 tablet  6  . FIBER SELECT GUMMIES PO Take by mouth.      Marland Kitchen FLUoxetine (PROZAC) 20 MG tablet Take 1 tablet (20 mg total) by mouth daily.  30 tablet  6  . fluticasone  (FLONASE) 50 MCG/ACT nasal spray Place 2 sprays into the nose daily.  16 g  6  . nystatin-triamcinolone (MYCOLOG II) cream Apply topically 2 (two) times daily. Apply to affected area twice a day.  30 g  0  . ranitidine (ZANTAC) 150 MG tablet Take 1 tablet in the morning and 2 tablets in the evening.  90 tablet  3  . Spacer/Aero Chamber Mouthpiece MISC 1 puff by Does not apply route as needed.  1 each  1   No facility-administered encounter medications on file as of 10/07/2012.     Review of Systems  Constitutional: Negative for fever, chills, appetite change, fatigue and unexpected weight change.  HENT: Positive for ear pain, congestion and sinus pressure. Negative for sore throat, trouble swallowing, neck pain and voice change.   Eyes: Negative for visual disturbance.  Respiratory: Negative for cough, shortness of breath, wheezing and stridor.   Cardiovascular: Negative for chest pain, palpitations and leg swelling.  Gastrointestinal: Negative for nausea, vomiting, abdominal pain, diarrhea, constipation, blood in stool, abdominal distention and anal bleeding.  Genitourinary: Negative for dysuria and flank pain.  Musculoskeletal: Negative for myalgias, arthralgias and gait problem.  Skin: Negative for color change and rash.  Neurological: Positive for dizziness. Negative for headaches.  Hematological: Negative for adenopathy. Does not bruise/bleed easily.  Psychiatric/Behavioral: Positive for behavioral problems, sleep disturbance and agitation. Negative for suicidal  ideas and dysphoric mood. The patient is nervous/anxious.        Objective:   Physical Exam  Constitutional: She is oriented to person, place, and time. She appears well-developed and well-nourished. No distress.  HENT:  Head: Normocephalic and atraumatic.  Right Ear: External ear normal. A middle ear effusion is present.  Left Ear: External ear normal. A middle ear effusion is present.  Nose: Mucosal edema and rhinorrhea  present. Right sinus exhibits maxillary sinus tenderness. Left sinus exhibits maxillary sinus tenderness.  Mouth/Throat: Oropharynx is clear and moist. No oropharyngeal exudate.  Eyes: Conjunctivae are normal. Pupils are equal, round, and reactive to light. Right eye exhibits no discharge. Left eye exhibits no discharge. No scleral icterus.  Neck: Normal range of motion. Neck supple. No tracheal deviation present. No thyromegaly present.  Cardiovascular: Normal rate, regular rhythm, normal heart sounds and intact distal pulses.  Exam reveals no gallop and no friction rub.   No murmur heard. Pulmonary/Chest: Effort normal and breath sounds normal. No accessory muscle usage. Not tachypneic. No respiratory distress. She has no decreased breath sounds. She has no wheezes. She has no rhonchi. She has no rales. She exhibits no tenderness.  Musculoskeletal: Normal range of motion. She exhibits no edema and no tenderness.  Lymphadenopathy:    She has no cervical adenopathy.  Neurological: She is alert and oriented to person, place, and time. No cranial nerve deficit. She exhibits normal muscle tone. Coordination normal.  Skin: Skin is warm and dry. No rash noted. She is not diaphoretic. No erythema. No pallor.  Psychiatric: Her speech is normal and behavior is normal. Judgment and thought content normal. Her mood appears anxious. Cognition and memory are normal.          Assessment & Plan:

## 2012-10-07 NOTE — Assessment & Plan Note (Signed)
Severe generalized anxiety disorder with panic attacks. Recent exacerbation of symptoms after changing to Clonazepam at direction of new psychiatrist. Will go back to previous regimen with alprazolam. Pt will followup with psychiatrist in the next couple of weeks. Continue Fluoxetine.

## 2012-10-08 ENCOUNTER — Encounter: Payer: Self-pay | Admitting: Internal Medicine

## 2012-10-10 ENCOUNTER — Encounter: Payer: Self-pay | Admitting: Internal Medicine

## 2012-10-11 DIAGNOSIS — Z9109 Other allergy status, other than to drugs and biological substances: Secondary | ICD-10-CM | POA: Diagnosis not present

## 2012-10-11 DIAGNOSIS — H81319 Aural vertigo, unspecified ear: Secondary | ICD-10-CM | POA: Diagnosis not present

## 2012-10-11 DIAGNOSIS — J019 Acute sinusitis, unspecified: Secondary | ICD-10-CM | POA: Diagnosis not present

## 2012-10-11 DIAGNOSIS — G4700 Insomnia, unspecified: Secondary | ICD-10-CM | POA: Diagnosis not present

## 2012-10-11 DIAGNOSIS — R0789 Other chest pain: Secondary | ICD-10-CM | POA: Diagnosis not present

## 2012-10-11 DIAGNOSIS — F329 Major depressive disorder, single episode, unspecified: Secondary | ICD-10-CM | POA: Diagnosis not present

## 2012-10-11 DIAGNOSIS — F3289 Other specified depressive episodes: Secondary | ICD-10-CM | POA: Diagnosis not present

## 2012-10-11 DIAGNOSIS — I499 Cardiac arrhythmia, unspecified: Secondary | ICD-10-CM | POA: Diagnosis not present

## 2012-10-11 DIAGNOSIS — Z882 Allergy status to sulfonamides status: Secondary | ICD-10-CM | POA: Diagnosis not present

## 2012-10-11 DIAGNOSIS — Z79899 Other long term (current) drug therapy: Secondary | ICD-10-CM | POA: Diagnosis not present

## 2012-10-11 DIAGNOSIS — F410 Panic disorder [episodic paroxysmal anxiety] without agoraphobia: Secondary | ICD-10-CM | POA: Diagnosis not present

## 2012-10-11 DIAGNOSIS — Z885 Allergy status to narcotic agent status: Secondary | ICD-10-CM | POA: Diagnosis not present

## 2012-10-11 DIAGNOSIS — F459 Somatoform disorder, unspecified: Secondary | ICD-10-CM | POA: Diagnosis present

## 2012-10-11 DIAGNOSIS — Z888 Allergy status to other drugs, medicaments and biological substances status: Secondary | ICD-10-CM | POA: Diagnosis not present

## 2012-10-11 DIAGNOSIS — R42 Dizziness and giddiness: Secondary | ICD-10-CM | POA: Diagnosis present

## 2012-10-11 DIAGNOSIS — K089 Disorder of teeth and supporting structures, unspecified: Secondary | ICD-10-CM | POA: Diagnosis not present

## 2012-10-11 DIAGNOSIS — F4001 Agoraphobia with panic disorder: Secondary | ICD-10-CM | POA: Diagnosis not present

## 2012-10-11 DIAGNOSIS — J329 Chronic sinusitis, unspecified: Secondary | ICD-10-CM | POA: Diagnosis not present

## 2012-10-11 DIAGNOSIS — Z85820 Personal history of malignant melanoma of skin: Secondary | ICD-10-CM | POA: Diagnosis not present

## 2012-10-11 DIAGNOSIS — Z881 Allergy status to other antibiotic agents status: Secondary | ICD-10-CM | POA: Diagnosis not present

## 2012-10-11 DIAGNOSIS — Z818 Family history of other mental and behavioral disorders: Secondary | ICD-10-CM | POA: Diagnosis not present

## 2012-10-11 DIAGNOSIS — Z91013 Allergy to seafood: Secondary | ICD-10-CM | POA: Diagnosis not present

## 2012-10-11 DIAGNOSIS — F411 Generalized anxiety disorder: Secondary | ICD-10-CM | POA: Diagnosis not present

## 2012-10-12 DIAGNOSIS — F4001 Agoraphobia with panic disorder: Secondary | ICD-10-CM | POA: Insufficient documentation

## 2012-10-14 ENCOUNTER — Ambulatory Visit: Payer: Medicare Other | Admitting: Internal Medicine

## 2012-10-16 DIAGNOSIS — J3489 Other specified disorders of nose and nasal sinuses: Secondary | ICD-10-CM | POA: Insufficient documentation

## 2012-11-03 DIAGNOSIS — F329 Major depressive disorder, single episode, unspecified: Secondary | ICD-10-CM | POA: Diagnosis not present

## 2012-11-04 DIAGNOSIS — R42 Dizziness and giddiness: Secondary | ICD-10-CM | POA: Diagnosis not present

## 2012-11-04 DIAGNOSIS — IMO0001 Reserved for inherently not codable concepts without codable children: Secondary | ICD-10-CM | POA: Diagnosis not present

## 2012-11-04 DIAGNOSIS — M436 Torticollis: Secondary | ICD-10-CM | POA: Diagnosis not present

## 2012-11-04 DIAGNOSIS — M542 Cervicalgia: Secondary | ICD-10-CM | POA: Diagnosis not present

## 2012-11-10 ENCOUNTER — Ambulatory Visit: Payer: Medicare Other | Attending: Student | Admitting: Physical Therapy

## 2012-11-10 DIAGNOSIS — R269 Unspecified abnormalities of gait and mobility: Secondary | ICD-10-CM | POA: Insufficient documentation

## 2012-11-10 DIAGNOSIS — R42 Dizziness and giddiness: Secondary | ICD-10-CM | POA: Diagnosis not present

## 2012-11-10 DIAGNOSIS — IMO0001 Reserved for inherently not codable concepts without codable children: Secondary | ICD-10-CM | POA: Insufficient documentation

## 2012-11-14 ENCOUNTER — Telehealth: Payer: Self-pay | Admitting: Internal Medicine

## 2012-11-14 ENCOUNTER — Ambulatory Visit: Payer: Medicare Other | Admitting: Physical Therapy

## 2012-11-14 DIAGNOSIS — R42 Dizziness and giddiness: Secondary | ICD-10-CM | POA: Diagnosis not present

## 2012-11-14 DIAGNOSIS — IMO0001 Reserved for inherently not codable concepts without codable children: Secondary | ICD-10-CM | POA: Diagnosis not present

## 2012-11-14 DIAGNOSIS — R269 Unspecified abnormalities of gait and mobility: Secondary | ICD-10-CM | POA: Diagnosis not present

## 2012-11-14 NOTE — Telephone Encounter (Signed)
She should make an appointment. I am not sure what this referral is for.

## 2012-11-14 NOTE — Telephone Encounter (Signed)
Patient is requesting a referral for Dr. Joelene Millin at [facility], [phone]. Please Brittney Ruiz

## 2012-11-15 NOTE — Telephone Encounter (Signed)
Left message to call back  

## 2012-11-17 NOTE — Telephone Encounter (Signed)
OKJoice Lofts, Can you set up this referral?

## 2012-11-17 NOTE — Telephone Encounter (Signed)
He is an ENT specialist at [facility]. She is still having the dizziness, she has seen [facility] to the max and taking physical therapy but still having the vertigo. Also she was hospitalized in [place] for 15 days with no relief.

## 2012-11-21 NOTE — Telephone Encounter (Signed)
Spoke with Oliver's office, they have to have a faxed referral for the doctor to review the patients chart and then will call us back with apt date/time if pt is accepted.

## 2012-11-22 ENCOUNTER — Ambulatory Visit: Payer: Medicare Other | Attending: Student | Admitting: Physical Therapy

## 2012-11-22 DIAGNOSIS — R269 Unspecified abnormalities of gait and mobility: Secondary | ICD-10-CM | POA: Diagnosis not present

## 2012-11-22 DIAGNOSIS — R42 Dizziness and giddiness: Secondary | ICD-10-CM | POA: Diagnosis not present

## 2012-11-22 DIAGNOSIS — IMO0001 Reserved for inherently not codable concepts without codable children: Secondary | ICD-10-CM | POA: Diagnosis not present

## 2012-11-25 ENCOUNTER — Ambulatory Visit: Payer: Medicare Other | Admitting: Rehabilitative and Restorative Service Providers"

## 2012-11-25 DIAGNOSIS — IMO0001 Reserved for inherently not codable concepts without codable children: Secondary | ICD-10-CM | POA: Diagnosis not present

## 2012-11-25 DIAGNOSIS — R42 Dizziness and giddiness: Secondary | ICD-10-CM | POA: Diagnosis not present

## 2012-11-25 DIAGNOSIS — F4310 Post-traumatic stress disorder, unspecified: Secondary | ICD-10-CM | POA: Diagnosis not present

## 2012-11-25 DIAGNOSIS — R269 Unspecified abnormalities of gait and mobility: Secondary | ICD-10-CM | POA: Diagnosis not present

## 2012-11-25 DIAGNOSIS — F331 Major depressive disorder, recurrent, moderate: Secondary | ICD-10-CM | POA: Diagnosis not present

## 2012-11-25 DIAGNOSIS — F411 Generalized anxiety disorder: Secondary | ICD-10-CM | POA: Diagnosis not present

## 2012-11-28 ENCOUNTER — Ambulatory Visit: Payer: Medicare Other | Admitting: Physical Therapy

## 2012-11-28 DIAGNOSIS — R42 Dizziness and giddiness: Secondary | ICD-10-CM | POA: Diagnosis not present

## 2012-11-28 DIAGNOSIS — IMO0001 Reserved for inherently not codable concepts without codable children: Secondary | ICD-10-CM | POA: Diagnosis not present

## 2012-11-28 DIAGNOSIS — R269 Unspecified abnormalities of gait and mobility: Secondary | ICD-10-CM | POA: Diagnosis not present

## 2012-11-30 DIAGNOSIS — C4370 Malignant melanoma of unspecified lower limb, including hip: Secondary | ICD-10-CM | POA: Insufficient documentation

## 2012-12-01 DIAGNOSIS — F329 Major depressive disorder, single episode, unspecified: Secondary | ICD-10-CM | POA: Diagnosis not present

## 2012-12-02 ENCOUNTER — Ambulatory Visit: Payer: Medicare Other | Admitting: Rehabilitative and Restorative Service Providers"

## 2012-12-02 DIAGNOSIS — IMO0001 Reserved for inherently not codable concepts without codable children: Secondary | ICD-10-CM | POA: Diagnosis not present

## 2012-12-02 DIAGNOSIS — R269 Unspecified abnormalities of gait and mobility: Secondary | ICD-10-CM | POA: Diagnosis not present

## 2012-12-02 DIAGNOSIS — F411 Generalized anxiety disorder: Secondary | ICD-10-CM | POA: Diagnosis not present

## 2012-12-02 DIAGNOSIS — F331 Major depressive disorder, recurrent, moderate: Secondary | ICD-10-CM | POA: Diagnosis not present

## 2012-12-02 DIAGNOSIS — R42 Dizziness and giddiness: Secondary | ICD-10-CM | POA: Diagnosis not present

## 2012-12-04 DIAGNOSIS — J329 Chronic sinusitis, unspecified: Secondary | ICD-10-CM | POA: Insufficient documentation

## 2012-12-05 ENCOUNTER — Ambulatory Visit: Payer: Medicare Other | Admitting: Rehabilitative and Restorative Service Providers"

## 2012-12-05 DIAGNOSIS — J329 Chronic sinusitis, unspecified: Secondary | ICD-10-CM | POA: Diagnosis not present

## 2012-12-05 DIAGNOSIS — IMO0001 Reserved for inherently not codable concepts without codable children: Secondary | ICD-10-CM | POA: Diagnosis not present

## 2012-12-05 DIAGNOSIS — J301 Allergic rhinitis due to pollen: Secondary | ICD-10-CM | POA: Insufficient documentation

## 2012-12-05 DIAGNOSIS — R42 Dizziness and giddiness: Secondary | ICD-10-CM | POA: Diagnosis not present

## 2012-12-05 DIAGNOSIS — R269 Unspecified abnormalities of gait and mobility: Secondary | ICD-10-CM | POA: Diagnosis not present

## 2012-12-08 ENCOUNTER — Ambulatory Visit: Payer: Medicare Other | Admitting: Physical Therapy

## 2012-12-08 DIAGNOSIS — R42 Dizziness and giddiness: Secondary | ICD-10-CM | POA: Diagnosis not present

## 2012-12-08 DIAGNOSIS — J309 Allergic rhinitis, unspecified: Secondary | ICD-10-CM | POA: Diagnosis not present

## 2012-12-08 DIAGNOSIS — IMO0001 Reserved for inherently not codable concepts without codable children: Secondary | ICD-10-CM | POA: Diagnosis not present

## 2012-12-08 DIAGNOSIS — R269 Unspecified abnormalities of gait and mobility: Secondary | ICD-10-CM | POA: Diagnosis not present

## 2012-12-09 DIAGNOSIS — F411 Generalized anxiety disorder: Secondary | ICD-10-CM | POA: Diagnosis not present

## 2012-12-09 DIAGNOSIS — F4310 Post-traumatic stress disorder, unspecified: Secondary | ICD-10-CM | POA: Diagnosis not present

## 2012-12-09 DIAGNOSIS — F331 Major depressive disorder, recurrent, moderate: Secondary | ICD-10-CM | POA: Diagnosis not present

## 2012-12-12 ENCOUNTER — Ambulatory Visit: Payer: Medicare Other | Admitting: Physical Therapy

## 2012-12-12 DIAGNOSIS — R269 Unspecified abnormalities of gait and mobility: Secondary | ICD-10-CM | POA: Diagnosis not present

## 2012-12-12 DIAGNOSIS — IMO0001 Reserved for inherently not codable concepts without codable children: Secondary | ICD-10-CM | POA: Diagnosis not present

## 2012-12-12 DIAGNOSIS — R42 Dizziness and giddiness: Secondary | ICD-10-CM | POA: Diagnosis not present

## 2012-12-15 ENCOUNTER — Ambulatory Visit: Payer: Medicare Other | Admitting: Rehabilitative and Restorative Service Providers"

## 2012-12-15 DIAGNOSIS — R269 Unspecified abnormalities of gait and mobility: Secondary | ICD-10-CM | POA: Diagnosis not present

## 2012-12-15 DIAGNOSIS — R42 Dizziness and giddiness: Secondary | ICD-10-CM | POA: Diagnosis not present

## 2012-12-15 DIAGNOSIS — IMO0001 Reserved for inherently not codable concepts without codable children: Secondary | ICD-10-CM | POA: Diagnosis not present

## 2012-12-16 DIAGNOSIS — F330 Major depressive disorder, recurrent, mild: Secondary | ICD-10-CM | POA: Diagnosis not present

## 2012-12-16 DIAGNOSIS — F4310 Post-traumatic stress disorder, unspecified: Secondary | ICD-10-CM | POA: Diagnosis not present

## 2012-12-19 ENCOUNTER — Ambulatory Visit: Payer: Medicare Other | Attending: Student | Admitting: Physical Therapy

## 2012-12-19 DIAGNOSIS — J329 Chronic sinusitis, unspecified: Secondary | ICD-10-CM | POA: Diagnosis not present

## 2012-12-19 DIAGNOSIS — K219 Gastro-esophageal reflux disease without esophagitis: Secondary | ICD-10-CM | POA: Diagnosis not present

## 2012-12-19 DIAGNOSIS — IMO0001 Reserved for inherently not codable concepts without codable children: Secondary | ICD-10-CM | POA: Insufficient documentation

## 2012-12-19 DIAGNOSIS — H1045 Other chronic allergic conjunctivitis: Secondary | ICD-10-CM | POA: Diagnosis not present

## 2012-12-19 DIAGNOSIS — R269 Unspecified abnormalities of gait and mobility: Secondary | ICD-10-CM | POA: Diagnosis not present

## 2012-12-19 DIAGNOSIS — R42 Dizziness and giddiness: Secondary | ICD-10-CM | POA: Diagnosis not present

## 2012-12-20 DIAGNOSIS — H1010 Acute atopic conjunctivitis, unspecified eye: Secondary | ICD-10-CM | POA: Insufficient documentation

## 2012-12-20 DIAGNOSIS — K219 Gastro-esophageal reflux disease without esophagitis: Secondary | ICD-10-CM | POA: Insufficient documentation

## 2012-12-22 ENCOUNTER — Ambulatory Visit: Payer: Medicare Other | Admitting: Physical Therapy

## 2012-12-22 DIAGNOSIS — R269 Unspecified abnormalities of gait and mobility: Secondary | ICD-10-CM | POA: Diagnosis not present

## 2012-12-22 DIAGNOSIS — IMO0001 Reserved for inherently not codable concepts without codable children: Secondary | ICD-10-CM | POA: Diagnosis not present

## 2012-12-22 DIAGNOSIS — R42 Dizziness and giddiness: Secondary | ICD-10-CM | POA: Diagnosis not present

## 2012-12-23 DIAGNOSIS — F330 Major depressive disorder, recurrent, mild: Secondary | ICD-10-CM | POA: Diagnosis not present

## 2012-12-23 DIAGNOSIS — F4310 Post-traumatic stress disorder, unspecified: Secondary | ICD-10-CM | POA: Diagnosis not present

## 2012-12-23 DIAGNOSIS — F411 Generalized anxiety disorder: Secondary | ICD-10-CM | POA: Diagnosis not present

## 2012-12-26 ENCOUNTER — Ambulatory Visit: Payer: Medicare Other | Admitting: Physical Therapy

## 2012-12-26 DIAGNOSIS — R42 Dizziness and giddiness: Secondary | ICD-10-CM | POA: Diagnosis not present

## 2012-12-26 DIAGNOSIS — IMO0001 Reserved for inherently not codable concepts without codable children: Secondary | ICD-10-CM | POA: Diagnosis not present

## 2012-12-26 DIAGNOSIS — R269 Unspecified abnormalities of gait and mobility: Secondary | ICD-10-CM | POA: Diagnosis not present

## 2012-12-28 DIAGNOSIS — H43819 Vitreous degeneration, unspecified eye: Secondary | ICD-10-CM | POA: Diagnosis not present

## 2012-12-29 ENCOUNTER — Encounter: Payer: Medicare Other | Admitting: Physical Therapy

## 2012-12-30 DIAGNOSIS — F4310 Post-traumatic stress disorder, unspecified: Secondary | ICD-10-CM | POA: Diagnosis not present

## 2012-12-30 DIAGNOSIS — F331 Major depressive disorder, recurrent, moderate: Secondary | ICD-10-CM | POA: Diagnosis not present

## 2013-01-05 DIAGNOSIS — J342 Deviated nasal septum: Secondary | ICD-10-CM | POA: Insufficient documentation

## 2013-01-05 DIAGNOSIS — J3489 Other specified disorders of nose and nasal sinuses: Secondary | ICD-10-CM | POA: Insufficient documentation

## 2013-01-05 DIAGNOSIS — J329 Chronic sinusitis, unspecified: Secondary | ICD-10-CM | POA: Diagnosis not present

## 2013-01-09 ENCOUNTER — Ambulatory Visit: Payer: Medicare Other | Admitting: Physical Therapy

## 2013-01-20 DIAGNOSIS — F3132 Bipolar disorder, current episode depressed, moderate: Secondary | ICD-10-CM | POA: Diagnosis not present

## 2013-01-20 DIAGNOSIS — F411 Generalized anxiety disorder: Secondary | ICD-10-CM | POA: Diagnosis not present

## 2013-01-20 DIAGNOSIS — F4310 Post-traumatic stress disorder, unspecified: Secondary | ICD-10-CM | POA: Diagnosis not present

## 2013-02-16 DIAGNOSIS — F411 Generalized anxiety disorder: Secondary | ICD-10-CM | POA: Diagnosis not present

## 2013-02-16 DIAGNOSIS — F329 Major depressive disorder, single episode, unspecified: Secondary | ICD-10-CM | POA: Diagnosis not present

## 2013-02-22 ENCOUNTER — Ambulatory Visit (INDEPENDENT_AMBULATORY_CARE_PROVIDER_SITE_OTHER): Payer: Medicare Other | Admitting: Adult Health

## 2013-02-22 ENCOUNTER — Encounter: Payer: Self-pay | Admitting: Adult Health

## 2013-02-22 VITALS — BP 122/80 | HR 67 | Temp 98.0°F | Resp 12 | Wt 215.0 lb

## 2013-02-22 DIAGNOSIS — J329 Chronic sinusitis, unspecified: Secondary | ICD-10-CM

## 2013-02-22 MED ORDER — AMOXICILLIN-POT CLAVULANATE 875-125 MG PO TABS: 1.0000 | ORAL_TABLET | Freq: Two times a day (BID) | ORAL | Status: DC

## 2013-02-22 NOTE — Progress Notes (Signed)
  Subjective:    Patient ID: Brittney Ruiz, female    DOB: [birth year], 47 y.o.   MRN: [id number]  HPI  Pt presents with left ear pain and sore throat. Feeling dizzy. Occasional cough. No fever or chills. Symptoms began 3 days ago. Also she has been started on Zyprexa and has gained weight. Reports multiple medication changes by psychiatrist. This was noted in her chart today.  Review of Systems  Constitutional: Negative for fever and chills.  HENT: Positive for ear pain, postnasal drip, rhinorrhea and sinus pressure.   Respiratory: Negative for chest tightness and shortness of breath.   Cardiovascular: Negative.   Gastrointestinal: Negative.        Objective:   Physical Exam  Constitutional: She is oriented to person, place, and time.  NAD  HENT:  Head: Normocephalic and atraumatic.  Right Ear: External ear normal.  Left ear with mild fluid accumulation  Cardiovascular: Normal rate and regular rhythm.   Pulmonary/Chest: Effort normal and breath sounds normal. No respiratory distress. She has no wheezes. She has no rales.  Lymphadenopathy:    She has no cervical adenopathy.  Neurological: She is alert and oriented to person, place, and time.  Psychiatric: She has a normal mood and affect. Her behavior is normal. Thought content normal.    BP 122/80  Pulse 67  Temp(Src) 98 F (36.7 C) (Oral)  Resp 12  Wt 215 lb (97.523 kg)  BMI 38.67 kg/m2  SpO2 97%       Assessment & Plan:

## 2013-02-22 NOTE — Assessment & Plan Note (Signed)
Start Augmentin twice a day x10 days. RTC if symptoms are not improved within 4-5 days. 

## 2013-03-03 ENCOUNTER — Ambulatory Visit: Payer: Medicare Other | Admitting: Internal Medicine

## 2013-03-23 ENCOUNTER — Other Ambulatory Visit: Payer: Self-pay

## 2013-03-30 DIAGNOSIS — F329 Major depressive disorder, single episode, unspecified: Secondary | ICD-10-CM | POA: Diagnosis not present

## 2013-03-30 DIAGNOSIS — F411 Generalized anxiety disorder: Secondary | ICD-10-CM | POA: Diagnosis not present

## 2013-04-03 DIAGNOSIS — Z23 Encounter for immunization: Secondary | ICD-10-CM | POA: Diagnosis not present

## 2013-04-10 ENCOUNTER — Encounter: Payer: Self-pay | Admitting: Adult Health

## 2013-04-10 ENCOUNTER — Ambulatory Visit (INDEPENDENT_AMBULATORY_CARE_PROVIDER_SITE_OTHER): Payer: Medicare Other | Admitting: Adult Health

## 2013-04-10 VITALS — BP 126/80 | HR 75 | Temp 98.1°F | Resp 16 | Wt 222.0 lb

## 2013-04-10 DIAGNOSIS — J329 Chronic sinusitis, unspecified: Secondary | ICD-10-CM

## 2013-04-10 MED ORDER — AMOXICILLIN-POT CLAVULANATE 875-125 MG PO TABS: 1.0000 | ORAL_TABLET | Freq: Two times a day (BID) | ORAL | Status: DC

## 2013-04-10 NOTE — Progress Notes (Signed)
Pre visit review using our clinic review tool, if applicable. No additional management support is needed unless otherwise documented below in the visit note. 

## 2013-04-10 NOTE — Assessment & Plan Note (Signed)
Thick green secretions from sinuses and also coughing it up. Wheezing. Start Augmentin bid x 10 days. Continue with Tussin for cough, tylenol for general aches.

## 2013-04-10 NOTE — Patient Instructions (Signed)
  Start Augmentin one tablet twice a day for 10 days.   Continue with Tussin and tylenol as needed.  For wheezing please use your inhaler to open up airway.  Call if no improvement within 3-4 days.

## 2013-04-10 NOTE — Progress Notes (Signed)
  Subjective:    Patient ID: Brittney Ruiz, female    DOB: 03/06/1966, 47 y.o.   MRN: [id number]  HPI  Patient presents to clinic with cough, sore throat, wheezing, thick green secretions from sinuses and also coughing up. Symptoms occurring since Thursday. She has been taking Tussin over the counter cough medicine. She also had left over augmentin from previous infection. Pt reports she did not complete because she was hospitalized and they gave her medication in hospital. Feels the augmentin does help.  Current Outpatient Prescriptions on File Prior to Visit  Medication Sig Dispense Refill  . ACETAMINOPHEN PO Take by mouth as needed.        Marland Kitchen albuterol (PROVENTIL HFA;VENTOLIN HFA) 108 (90 BASE) MCG/ACT inhaler Inhale 2 puffs into the lungs every 6 (six) hours as needed.      . clorazepate (TRANXENE) 7.5 MG tablet Take 7.5 mg by mouth 3 (three) times daily.      Tery Sanfilippo Calcium (STOOL SOFTENER PO) Take 1 tablet by mouth as needed.        Marland Kitchen EPINEPHrine (EPIPEN) 0.3 mg/0.3 mL DEVI Inject 0.3 mLs (0.3 mg total) into the muscle once.  1 Device  1  . fexofenadine (ALLER-EASE) 180 MG tablet Take 1 tablet (180 mg total) by mouth daily.  30 tablet  6  . fluticasone (FLONASE) 50 MCG/ACT nasal spray Place 2 sprays into the nose daily.  16 g  6  . OLANZapine (ZYPREXA) 5 MG tablet Take 5 mg by mouth at bedtime.      Marland Kitchen omeprazole (PRILOSEC) 20 MG capsule Take 20 mg by mouth daily.      . propranolol (INDERAL) 20 MG tablet Take 20 mg by mouth 3 (three) times daily.      . sertraline (ZOLOFT) 100 MG tablet Take 150 mg by mouth daily.      . sertraline (ZOLOFT) 25 MG tablet Take 25 mg by mouth 2 (two) times daily.      Marland Kitchen Spacer/Aero Chamber Mouthpiece MISC 1 puff by Does not apply route as needed.  1 each  1  . vitamin C (ASCORBIC ACID) 500 MG tablet Take 500 mg by mouth daily.      Marland Kitchen b complex vitamins tablet Take 1 tablet by mouth daily.       No current facility-administered medications on file prior to  visit.     Review of Systems  Constitutional: Negative for fever and chills.  HENT: Positive for congestion, postnasal drip, rhinorrhea, sinus pressure and sneezing. Negative for sore throat.   Respiratory: Positive for cough and wheezing.        Objective:   Physical Exam  Constitutional: She is oriented to person, place, and time.  HENT:  Head: Normocephalic and atraumatic.  Pharyngeal erythema  Cardiovascular: Normal rate, regular rhythm and normal heart sounds.  Exam reveals no gallop.   No murmur heard. Pulmonary/Chest: Effort normal. No respiratory distress. She has wheezes. She has no rales.  Neurological: She is alert and oriented to person, place, and time.  Psychiatric: She has a normal mood and affect. Her behavior is normal. Judgment and thought content normal.          Assessment & Plan:

## 2013-04-24 ENCOUNTER — Ambulatory Visit: Payer: Self-pay | Admitting: Internal Medicine

## 2013-04-24 DIAGNOSIS — Z1231 Encounter for screening mammogram for malignant neoplasm of breast: Secondary | ICD-10-CM | POA: Diagnosis not present

## 2013-05-22 ENCOUNTER — Telehealth: Payer: Self-pay | Admitting: Internal Medicine

## 2013-05-22 NOTE — Telephone Encounter (Signed)
Needs to be seen prior to antibiotics.

## 2013-05-22 NOTE — Telephone Encounter (Signed)
Fwd to Dr. Walker 

## 2013-05-22 NOTE — Telephone Encounter (Signed)
Patient Information:  Caller Name: Tamella  Phone: [phone]  Patient: Brittney Ruiz, Brittney Ruiz  Gender: Female  DOB: 1966/01/01  Age: 48 Years  PCP: Ronette Deter (Adults only)  Pregnant: No  Office Follow Up:  Does the office need to follow up with this patient?: Yes  Instructions For The Office: Pt doesn't want to go to any of the other locations. Please call her back and advise   Symptoms  Reason For Call & Symptoms: Pt had called for appt today/appts are full. So she is asking if MD will call in Augmentin for a cough productive of green sputum. pt states she was in for the same thing 2 months ago. Pt started with congestion/cough on 05/18/13. Pt has a tendency for ear ears to get infected so MD will presribe Augmentin. Pt also has a hoarse voice  Reviewed Health History In EMR: Yes  Reviewed Medications In EMR: Yes  Reviewed Allergies In EMR: Yes  Reviewed Surgeries / Procedures: Yes  Date of Onset of Symptoms: 05/18/2013  Treatments Tried: Tylenol; Robitussin  Treatments Tried Worked: No OB / GYN:  LMP: 04/21/2013  Guideline(s) Used:  Cough  Disposition Per Guideline:   Home Care  Reason For Disposition Reached:   Cough with no complications  Advice Given:  N/A  Patient Refused Recommendation:  Patient Requests Prescription  Pt is requesting MD prescribe Augmentin to [facility] [phone]

## 2013-05-22 NOTE — Telephone Encounter (Signed)
Left message to call back  

## 2013-05-23 ENCOUNTER — Ambulatory Visit (INDEPENDENT_AMBULATORY_CARE_PROVIDER_SITE_OTHER): Payer: Medicare Other | Admitting: Adult Health

## 2013-05-23 ENCOUNTER — Encounter: Payer: Self-pay | Admitting: Adult Health

## 2013-05-23 VITALS — BP 118/78 | HR 74 | Temp 98.2°F | Resp 12 | Ht 62.5 in | Wt 225.0 lb

## 2013-05-23 DIAGNOSIS — R059 Cough, unspecified: Secondary | ICD-10-CM

## 2013-05-23 DIAGNOSIS — R05 Cough: Secondary | ICD-10-CM | POA: Diagnosis not present

## 2013-05-23 MED ORDER — AMOXICILLIN-POT CLAVULANATE 875-125 MG PO TABS: 1.0000 | ORAL_TABLET | Freq: Two times a day (BID) | ORAL | Status: DC

## 2013-05-23 MED ORDER — ALBUTEROL SULFATE (2.5 MG/3ML) 0.083% IN NEBU
2.5000 mg | INHALATION_SOLUTION | Freq: Once | RESPIRATORY_TRACT | Status: AC
  Administered 2013-05-23: 2.5 mg via RESPIRATORY_TRACT

## 2013-05-23 MED ORDER — ALBUTEROL SULFATE HFA 108 (90 BASE) MCG/ACT IN AERS: 2.0000 | INHALATION_SPRAY | Freq: Four times a day (QID) | RESPIRATORY_TRACT | Status: DC | PRN

## 2013-05-23 NOTE — Progress Notes (Signed)
   Subjective:    Patient ID: Brittney Ruiz, female    DOB: 09-03-1965, 48 y.o.   MRN: [id number]  HPI  Pt is a 48 y/o female who presents to clinic with cough, congestion. She reports coughing up green sputum. She also reports wheezing.   Current Outpatient Prescriptions on File Prior to Visit  Medication Sig Dispense Refill  . ACETAMINOPHEN PO Take by mouth as needed.        . clorazepate (TRANXENE) 7.5 MG tablet Take 7.5 mg by mouth 3 (three) times daily.      [name] Calcium (STOOL SOFTENER PO) Take 1 tablet by mouth as needed.        Marland Kitchen EPINEPHrine (EPIPEN) 0.3 mg/0.3 mL DEVI Inject 0.3 mLs (0.3 mg total) into the muscle once.  1 Device  1  . fexofenadine (ALLER-EASE) 180 MG tablet Take 1 tablet (180 mg total) by mouth daily.  30 tablet  6  . fluticasone (FLONASE) 50 MCG/ACT nasal spray Place 2 sprays into the nose daily.  16 g  6  . OLANZapine (ZYPREXA) 5 MG tablet Take 5 mg by mouth at bedtime.      Marland Kitchen omeprazole (PRILOSEC) 20 MG capsule Take 20 mg by mouth daily.      . propranolol (INDERAL) 20 MG tablet Take 20 mg by mouth 3 (three) times daily.      . sertraline (ZOLOFT) 100 MG tablet Take 150 mg by mouth daily.      . sertraline (ZOLOFT) 25 MG tablet Take 25 mg by mouth 2 (two) times daily.      Marland Kitchen Spacer/Aero Chamber Mouthpiece MISC 1 puff by Does not apply route as needed.  1 each  1  . vitamin C (ASCORBIC ACID) 500 MG tablet Take 500 mg by mouth daily.       No current facility-administered medications on file prior to visit.      Review of Systems  Constitutional: Negative for fever and chills.  HENT: Positive for congestion, postnasal drip, rhinorrhea and sinus pressure. Negative for sore throat.   Respiratory: Positive for cough and wheezing.   Cardiovascular: Negative.   Musculoskeletal:       Swollen and painful right hand s/p injury       Objective:   Physical Exam  Constitutional: She is oriented to person, place, and time.  Appears acutely ill  HENT:    Head: Normocephalic and atraumatic.  Right Ear: External ear normal.  Left Ear: External ear normal.  Pharyngeal erythema  Cardiovascular: Normal rate, regular rhythm and normal heart sounds.   Pulmonary/Chest: Effort normal. No respiratory distress. She has wheezes. She has no rales.  Expiratory wheezing posterior bilateral apex and anteriorly bilaterally  Neurological: She is alert and oriented to person, place, and time.  Skin: Skin is warm and dry.  Psychiatric: She has a normal mood and affect. Her behavior is normal. Judgment and thought content normal.    BP 118/78  Pulse 74  Temp(Src) 98.2 F (36.8 C) (Oral)  Resp 12  Ht 5' 2.5" (1.588 m)  Wt 225 lb (102.059 kg)  BMI 40.47 kg/m2  SpO2 95%      Assessment & Plan:

## 2013-05-23 NOTE — Patient Instructions (Addendum)
  Start Augmentin 1 tablet twice a day for 10 days.  Use albuterol inhaler 2 puffs into the lungs every 6 hours as needed for wheezing, chest tightness or shortness of breath.  Drink fluids to stay hydrated.  Return to clinic in one week for followup or sooner if necessary.

## 2013-05-23 NOTE — Addendum Note (Signed)
Addended by: Nanci Pina on: 05/23/2013 04:37 PM   Modules accepted: Orders

## 2013-05-23 NOTE — Assessment & Plan Note (Signed)
Productive green sputum. Wheezing. Patient reports not tolerating the prednisone taper. Nebulizer treatment given in the office. Start Augmentin twice a day x10 days. Albuterol inhaler every 6 hours as needed for wheezing, chest tightness or shortness of breath. Return to clinic in one week for followup or sooner if necessary.

## 2013-05-23 NOTE — Progress Notes (Signed)
Pre visit review using our clinic review tool, if applicable. No additional management support is needed unless otherwise documented below in the visit note. 

## 2013-05-29 ENCOUNTER — Encounter: Payer: Self-pay | Admitting: Internal Medicine

## 2013-06-01 DIAGNOSIS — F329 Major depressive disorder, single episode, unspecified: Secondary | ICD-10-CM | POA: Diagnosis not present

## 2013-06-01 DIAGNOSIS — F3289 Other specified depressive episodes: Secondary | ICD-10-CM | POA: Diagnosis not present

## 2013-06-01 DIAGNOSIS — F411 Generalized anxiety disorder: Secondary | ICD-10-CM | POA: Diagnosis not present

## 2013-06-02 ENCOUNTER — Encounter: Payer: Self-pay | Admitting: Adult Health

## 2013-06-02 ENCOUNTER — Ambulatory Visit (INDEPENDENT_AMBULATORY_CARE_PROVIDER_SITE_OTHER): Payer: Medicare Other | Admitting: Adult Health

## 2013-06-02 VITALS — BP 122/72 | HR 65 | Temp 98.0°F | Resp 12 | Wt 223.0 lb

## 2013-06-02 DIAGNOSIS — R059 Cough, unspecified: Secondary | ICD-10-CM | POA: Diagnosis not present

## 2013-06-02 DIAGNOSIS — R05 Cough: Secondary | ICD-10-CM | POA: Diagnosis not present

## 2013-06-02 NOTE — Assessment & Plan Note (Signed)
Symptoms have all improved. Still some nasal congestion. Recommend trying children's Dimetapp Elixir to see if this helps the congestions. Use inhaler as needed for wheezing.

## 2013-06-02 NOTE — Progress Notes (Signed)
Subjective:    Patient ID: Brittney Ruiz, female    DOB: [birth year], 48 y.o.   MRN: [id number]  HPI  Pt is a pleasant 48 y/o female who presents to clinic for follow up. She was seen on 05/23/12 for productive green sputum. Wheezing. She was started on prednisone but could not tolerate it. She was given a Nebulizer treatment in the office. Started on Augmentin twice a day x10 days. Albuterol inhaler every 6 hours as needed for wheezing, chest tightness or shortness of breath. She was asked to return for follow up in 1 week. She completed her antibiotic yesterday. Feels improved. Still having some nasal congestion. No fever, chills or wheezing.  Current Outpatient Prescriptions on File Prior to Visit  Medication Sig Dispense Refill  . ACETAMINOPHEN PO Take by mouth as needed.        Marland Kitchen albuterol (PROVENTIL HFA;VENTOLIN HFA) 108 (90 BASE) MCG/ACT inhaler Inhale 2 puffs into the lungs every 6 (six) hours as needed.  1 Inhaler  2  . clorazepate (TRANXENE) 7.5 MG tablet Take 7.5 mg by mouth 3 (three) times daily.      [name] Calcium (STOOL SOFTENER PO) Take 1 tablet by mouth as needed.        Marland Kitchen EPINEPHrine (EPIPEN) 0.3 mg/0.3 mL DEVI Inject 0.3 mLs (0.3 mg total) into the muscle once.  1 Device  1  . fexofenadine (ALLER-EASE) 180 MG tablet Take 1 tablet (180 mg total) by mouth daily.  30 tablet  6  . fluticasone (FLONASE) 50 MCG/ACT nasal spray Place 2 sprays into the nose daily.  16 g  6  . Multiple Vitamin (MULTIVITAMIN) tablet Take 1 tablet by mouth daily.      Marland Kitchen OLANZapine (ZYPREXA) 5 MG tablet Take 5 mg by mouth at bedtime.      Marland Kitchen omeprazole (PRILOSEC) 20 MG capsule Take 20 mg by mouth daily.      . propranolol (INDERAL) 20 MG tablet Take 20 mg by mouth 3 (three) times daily.      . sertraline (ZOLOFT) 100 MG tablet Take 150 mg by mouth daily.      . sertraline (ZOLOFT) 25 MG tablet Take 25 mg by mouth 2 (two) times daily.      Marland Kitchen Spacer/Aero Chamber Mouthpiece MISC 1 puff by Does not apply  route as needed.  1 each  1  . vitamin C (ASCORBIC ACID) 500 MG tablet Take 500 mg by mouth daily.       No current facility-administered medications on file prior to visit.    Review of Systems  Constitutional: Negative.   HENT: Positive for congestion, postnasal drip, rhinorrhea and sinus pressure.   Respiratory: Negative for cough, shortness of breath and wheezing.   Cardiovascular: Negative.   Neurological: Negative.   Psychiatric/Behavioral: Negative.   All other systems reviewed and are negative.       Objective:   Physical Exam  Constitutional: She is oriented to person, place, and time. No distress.  Cardiovascular: Normal rate, regular rhythm and normal heart sounds.  Exam reveals no gallop.   No murmur heard. Pulmonary/Chest: Effort normal and breath sounds normal. No respiratory distress. She has no wheezes. She has no rales.  Neurological: She is alert and oriented to person, place, and time.  Skin: Skin is warm.  Psychiatric: She has a normal mood and affect. Her behavior is normal. Judgment and thought content normal.          Assessment & Plan:

## 2013-08-02 ENCOUNTER — Telehealth: Payer: Self-pay | Admitting: Internal Medicine

## 2013-08-02 ENCOUNTER — Ambulatory Visit (INDEPENDENT_AMBULATORY_CARE_PROVIDER_SITE_OTHER): Payer: Medicare Other | Admitting: Adult Health

## 2013-08-02 ENCOUNTER — Encounter: Payer: Self-pay | Admitting: Adult Health

## 2013-08-02 VITALS — BP 102/68 | HR 59 | Temp 97.6°F | Resp 14 | Wt 201.0 lb

## 2013-08-02 DIAGNOSIS — J329 Chronic sinusitis, unspecified: Secondary | ICD-10-CM | POA: Diagnosis not present

## 2013-08-02 DIAGNOSIS — J029 Acute pharyngitis, unspecified: Secondary | ICD-10-CM | POA: Insufficient documentation

## 2013-08-02 LAB — POCT RAPID STREP A (OFFICE): RAPID STREP A SCREEN: NEGATIVE

## 2013-08-02 MED ORDER — AMOXICILLIN-POT CLAVULANATE 875-125 MG PO TABS: 1.0000 | ORAL_TABLET | Freq: Two times a day (BID) | ORAL | Status: DC

## 2013-08-02 MED ORDER — OMEPRAZOLE 20 MG PO CPDR: 20.0000 mg | DELAYED_RELEASE_CAPSULE | Freq: Every day | ORAL | Status: DC

## 2013-08-02 NOTE — Telephone Encounter (Signed)
Patient Information:  Caller Name: Hass[place]  Phone: [phone]  Patient: Brittney Ruiz, Brittney Ruiz  Gender: Female  DOB: May 15, 1966  Age: 48 Years  PCP: Ronette Deter (Adults only)  Pregnant: No  Office Follow Up:  Does the office need to follow up with this patient?: No  Instructions For The Office: N/A  RN Note:  Drinking fluids.  Difficult to swallow Tylenol.  Advised to keep appointment for 08/02/13 at 1600 with R. Rey, NP  Symptoms  Reason For Call & Symptoms: Office scheduled for 1600 apppointment 08/02/13 with Doreen Salvage, NP then transferred for triage. Productive cough with yellow/green mucus, sore throat with difficulty swallowing, and bilateral ear pain.  Reviewed Health History In EMR: Yes  Reviewed Medications In EMR: Yes  Reviewed Allergies In EMR: Yes  Reviewed Surgeries / Procedures: Yes  Date of Onset of Symptoms: 07/30/2013  Treatments Tried: Tylenol, Tussin, gargle with salt water, cough drops  Treatments Tried Worked: No OB / GYN:  LMP: 07/16/2013  Guideline(s) Used:  Sore Throat  Disposition Per Guideline:   See Today in Office  Reason For Disposition Reached:   Severe sore throat pain  Advice Given:  For Relief of Sore Throat Pain:  Sip warm chicken broth or apple juice.  Suck on hard candy or a throat lozenge (over-the-counter).  Gargle warm salt water 3 times daily (1 teaspoon of salt in 8 oz or 240 ml of warm water).  Pain Medicines:  For pain relief, you can take either acetaminophen, ibuprofen, or naproxen.  They are over-the-counter (OTC) pain drugs. You can buy them at the drugstore.  Soft Diet:   Cold drinks and milk shakes are especially good (Reason: swollen tonsils can make some foods hard to swallow).  Liquids:  Adequate liquid intake is important to prevent dehydration. Drink 6-8 glasses of water per day.  Contagiousness:   You can return to work or school after the fever is gone and you feel well enough to participate in normal activities. If your  doctor determines that you have Strep throat, then you will need to take an antibiotic for 24 hours before you can return.  Expected Course:  Sore throats with viral illnesses usually last 3 or 4 days.  Call Back If:  Sore throat is the main symptom and it lasts longer than 24 hours  Fever lasts longer than 3 days  You become worse.  RN Overrode Recommendation:  Patient Already Has Appt, Document Patient  Office scheduled before triage

## 2013-08-02 NOTE — Telephone Encounter (Signed)
FYI: Advised to keep appointment for 08/02/13 at 1600 with R. Marcie Bal, NP

## 2013-08-02 NOTE — Progress Notes (Signed)
Pre visit review using our clinic review tool, if applicable. No additional management support is needed unless otherwise documented below in the visit note. 

## 2013-08-02 NOTE — Progress Notes (Signed)
Patient ID: Brittney Ruiz, female   DOB: 12-09-1965, 48 y.o.   MRN: [id number]   Subjective:    Patient ID: Brittney Ruiz, female    DOB: March 14, 1966, 48 y.o.   MRN: [id number]  HPI  Pt is a pleasant 48 y/o female who presents to clinic with sinus symptoms of pressure, drainage, post nasal drip that has been ongoing x 2 weeks. She started to have a sore throat 3 days ago. Denies fever, chills, shortness of breath. She reports wheezing earlier today.   Past Medical History  Diagnosis Date  . Depression   . Allergy   . Anxiety   . Obesity (BMI 30-39.9)   . Vaginal delivery     X 2    Current Outpatient Prescriptions on File Prior to Visit  Medication Sig Dispense Refill  . ACETAMINOPHEN PO Take by mouth as needed.        Marland Kitchen albuterol (PROVENTIL HFA;VENTOLIN HFA) 108 (90 BASE) MCG/ACT inhaler Inhale 2 puffs into the lungs every 6 (six) hours as needed.  1 Inhaler  2  . clorazepate (TRANXENE) 7.5 MG tablet Take 7.5 mg by mouth 3 (three) times daily.      [name] Calcium (STOOL SOFTENER PO) Take 1 tablet by mouth as needed.        Marland Kitchen EPINEPHrine (EPIPEN) 0.3 mg/0.3 mL DEVI Inject 0.3 mLs (0.3 mg total) into the muscle once.  1 Device  1  . fexofenadine (ALLER-EASE) 180 MG tablet Take 1 tablet (180 mg total) by mouth daily.  30 tablet  6  . FIBER SELECT GUMMIES PO Take by mouth.      . fluticasone (FLONASE) 50 MCG/ACT nasal spray Place 2 sprays into the nose daily.  16 g  6  . Multiple Vitamin (MULTIVITAMIN) tablet Take 1 tablet by mouth daily.      Marland Kitchen OLANZapine (ZYPREXA) 5 MG tablet Take 5 mg by mouth at bedtime.      Marland Kitchen omeprazole (PRILOSEC) 20 MG capsule Take 20 mg by mouth daily.      . propranolol (INDERAL) 20 MG tablet Take 20 mg by mouth 3 (three) times daily.      . sertraline (ZOLOFT) 100 MG tablet Take 150 mg by mouth daily.      . sertraline (ZOLOFT) 25 MG tablet Take 25 mg by mouth 2 (two) times daily.      Marland Kitchen Spacer/Aero Chamber Mouthpiece MISC 1 puff by Does not apply route as  needed.  1 each  1  . vitamin C (ASCORBIC ACID) 500 MG tablet Take 500 mg by mouth daily.       No current facility-administered medications on file prior to visit.     Review of Systems  Constitutional: Negative for fever and chills.  HENT: Positive for congestion, postnasal drip, rhinorrhea, sinus pressure, sneezing and sore throat.   Respiratory: Positive for cough and wheezing.   Musculoskeletal:       Swollen and painful right hand s/p injury  All other systems reviewed and are negative.       Objective:  BP 102/68  Pulse 59  Temp(Src) 97.6 F (36.4 C) (Oral)  Resp 14  Wt 201 lb (91.173 kg)  SpO2 99%  LMP 07/17/2013   Physical Exam  Constitutional: She is oriented to person, place, and time. She appears well-developed and well-nourished. No distress.  HENT:  Head: Normocephalic and atraumatic.  Right Ear: External ear normal.  Left Ear: External ear normal.  Mouth/Throat: No oropharyngeal exudate.  Pharyngeal erythema  Cardiovascular: Normal rate, regular rhythm and normal heart sounds.  Exam reveals no gallop.   No murmur heard. Pulmonary/Chest: Effort normal and breath sounds normal. No respiratory distress. She has no wheezes. She has no rales.  Lymphadenopathy:    She has cervical adenopathy.  Neurological: She is alert and oriented to person, place, and time.  Psychiatric: She has a normal mood and affect. Her behavior is normal. Judgment and thought content normal.      Assessment & Plan:   1. Sore throat Negative for strep. Salt water gargles, chloraseptic spray, tylenol for pain - POCT rapid strep A  2. Sinusitis Symptoms ongoing for greater than 2 weeks. Start augmentin bid x 10 days. RTC if no improvement within 4-5 days.

## 2013-08-31 DIAGNOSIS — F329 Major depressive disorder, single episode, unspecified: Secondary | ICD-10-CM | POA: Diagnosis not present

## 2013-08-31 DIAGNOSIS — F411 Generalized anxiety disorder: Secondary | ICD-10-CM | POA: Diagnosis not present

## 2013-08-31 DIAGNOSIS — F3289 Other specified depressive episodes: Secondary | ICD-10-CM | POA: Diagnosis not present

## 2014-01-15 DIAGNOSIS — F329 Major depressive disorder, single episode, unspecified: Secondary | ICD-10-CM | POA: Diagnosis not present

## 2014-01-15 DIAGNOSIS — F411 Generalized anxiety disorder: Secondary | ICD-10-CM | POA: Diagnosis not present

## 2014-01-15 DIAGNOSIS — F3289 Other specified depressive episodes: Secondary | ICD-10-CM | POA: Diagnosis not present

## 2014-02-02 ENCOUNTER — Ambulatory Visit (INDEPENDENT_AMBULATORY_CARE_PROVIDER_SITE_OTHER): Payer: Medicare Other | Admitting: Family Medicine

## 2014-02-02 ENCOUNTER — Encounter: Payer: Self-pay | Admitting: Family Medicine

## 2014-02-02 ENCOUNTER — Telehealth: Payer: Self-pay | Admitting: Internal Medicine

## 2014-02-02 VITALS — BP 123/80 | HR 76 | Temp 98.8°F | Ht 62.5 in | Wt 209.0 lb

## 2014-02-02 DIAGNOSIS — J209 Acute bronchitis, unspecified: Secondary | ICD-10-CM | POA: Diagnosis not present

## 2014-02-02 MED ORDER — AMOXICILLIN-POT CLAVULANATE 875-125 MG PO TABS: 1.0000 | ORAL_TABLET | Freq: Two times a day (BID) | ORAL | Status: DC

## 2014-02-02 NOTE — Progress Notes (Signed)
   Subjective:    Patient ID: Brittney Ruiz, female    DOB: [birth year], 48 y.o.   MRN: [id number]  HPI Here for 3 days of chest tightness, coughing up green sputum, and PND. No fever.    Review of Systems  Constitutional: Negative.   HENT: Positive for congestion and postnasal drip. Negative for sinus pressure.   Eyes: Negative.   Respiratory: Positive for cough, chest tightness and wheezing.   Cardiovascular: Negative.        Objective:   Physical Exam  Constitutional: She appears well-developed and well-nourished.  HENT:  Right Ear: External ear normal.  Left Ear: External ear normal.  Nose: Nose normal.  Mouth/Throat: Oropharynx is clear and moist.  Eyes: Conjunctivae are normal.  Pulmonary/Chest: Effort normal. No respiratory distress. She has no rales.  Scattered rhonchi and wheezes   Lymphadenopathy:    She has no cervical adenopathy.          Assessment & Plan:  Treat with Augmentin and Delsym. She will use her inhaler prn

## 2014-02-02 NOTE — Progress Notes (Signed)
Pre visit review using our clinic review tool, if applicable. No additional management support is needed unless otherwise documented below in the visit note. 

## 2014-02-02 NOTE — Telephone Encounter (Signed)
Patient Information:  Caller Name: Ria  Phone: [phone]  Patient: Brittney Ruiz, Brittney Ruiz  Gender: Female  DOB: [birth year]  Age: 48 Years  PCP: Ronette Deter (Adults only)  Pregnant: No  Office Follow Up:  Does the office need to follow up with this patient?: No  Instructions For The Office: N/A   Symptoms  Reason For Call & Symptoms: Pt is calling and states that she is coughing up green mucus from her chest; sx started 01/30/2014; other sx include sore throat, deep cough; no fever; pt requesting appt for today due to her wanting an antibiotic  Reviewed Health History In EMR: Yes  Reviewed Medications In EMR: Yes  Reviewed Allergies In EMR: Yes  Reviewed Surgeries / Procedures: Yes  Date of Onset of Symptoms: 01/30/2014  Treatments Tried: Dimatapp cold and allergy; Tylenol; Tussin cough medicine  Treatments Tried Worked: No OB / GYN:  LMP: 01/25/2014  Guideline(s) Used:  Cough  Disposition Per Guideline:   See Today in Office  Reason For Disposition Reached:   Severe coughing spells (e.g., whooping sound after coughing, vomiting after coughing)  Advice Given:  Coughing Spasms:  Drink warm fluids. Inhale warm mist (Reason: both relax the airway and loosen up the phlegm).  Suck on cough drops or hard candy to coat the irritated throat.  Prevent Dehydration:  Drink adequate liquids.  Call Back If:  Difficulty breathing  You become worse.  Patient Will Follow Care Advice:  YES  Appointment Scheduled:  02/02/2014 13:15:00 Appointment Scheduled Provider:  Other- Dr Alvin Critchley Dr Gilford Rile office and they are not able to get pt in today to be seen.  Pt states that she will go to [place] location.

## 2014-05-02 ENCOUNTER — Ambulatory Visit: Payer: Self-pay | Admitting: Internal Medicine

## 2014-05-02 DIAGNOSIS — Z1231 Encounter for screening mammogram for malignant neoplasm of breast: Secondary | ICD-10-CM | POA: Diagnosis not present

## 2014-05-03 DIAGNOSIS — D1801 Hemangioma of skin and subcutaneous tissue: Secondary | ICD-10-CM | POA: Diagnosis not present

## 2014-05-03 DIAGNOSIS — Z08 Encounter for follow-up examination after completed treatment for malignant neoplasm: Secondary | ICD-10-CM | POA: Diagnosis not present

## 2014-05-03 DIAGNOSIS — Z85820 Personal history of malignant melanoma of skin: Secondary | ICD-10-CM | POA: Diagnosis not present

## 2014-05-03 DIAGNOSIS — D225 Melanocytic nevi of trunk: Secondary | ICD-10-CM | POA: Diagnosis not present

## 2014-05-03 DIAGNOSIS — D485 Neoplasm of uncertain behavior of skin: Secondary | ICD-10-CM | POA: Diagnosis not present

## 2014-05-07 ENCOUNTER — Telehealth: Payer: Self-pay | Admitting: Internal Medicine

## 2014-05-07 NOTE — Telephone Encounter (Signed)
Brittney Ruiz called saying she feels bad and has a sore throat and has been coughing up greenish/yello chunks of mucus. She's worried that it'll turn into a sinus infection. She has an appt with Dr. Gilford Rile on 12/24 but is wondering if she can be seen sooner than that. She said these symptoms usually go into her ears and then her anxiety kicks in etc. She also stated Dr. Gilford Rile knows how quickly her symptoms advance. Please call the pt if she can be seen sooner.  Pt ph# [phone] Thank you.

## 2014-05-07 NOTE — Telephone Encounter (Signed)
I called pt to advise her than there was an appointment available for tomorrow 05/08/14. Pt told me twice that she is not Dr. Thomes Dinning patient. Verified name and DOB and she again told me she was not a patient of Dr. Thomes Dinning.

## 2014-05-08 ENCOUNTER — Ambulatory Visit (INDEPENDENT_AMBULATORY_CARE_PROVIDER_SITE_OTHER): Payer: Medicare Other | Admitting: Internal Medicine

## 2014-05-08 ENCOUNTER — Encounter: Payer: Self-pay | Admitting: Internal Medicine

## 2014-05-08 VITALS — BP 126/74 | HR 76 | Temp 98.8°F | Resp 16 | Ht 62.5 in | Wt 210.8 lb

## 2014-05-08 DIAGNOSIS — J069 Acute upper respiratory infection, unspecified: Secondary | ICD-10-CM | POA: Diagnosis not present

## 2014-05-08 DIAGNOSIS — B9789 Other viral agents as the cause of diseases classified elsewhere: Principal | ICD-10-CM

## 2014-05-08 MED ORDER — AMOXICILLIN-POT CLAVULANATE 875-125 MG PO TABS: 1.0000 | ORAL_TABLET | Freq: Two times a day (BID) | ORAL | Status: DC

## 2014-05-08 NOTE — Patient Instructions (Signed)
You have a viral syndrome which is causing  Sinus congestion and ear fullness .    You do not currently have a bacterial infection and do no currently need antibiotics.  The post nasal drip may alsoe be contributing to  your  Cough.  I  Advise you to  use the following OTC meds to help with your symptoms.   Take generic OTC benadryl 25 mg  At bedtime for the drainage,,    Delsym  or Dimetapp  for daytime cough. flush your sinuses twice daily with Simply Saline  Use Afrin nasal spray two times daily to relieve your nasal congestion  If you develop  Fever (T > 100.4)  Bloody streaked nasal discharge,  Or moderate facial or ear pain, start the augmentin.  Please take a probiotic ( Align, Floraque or Culturelle) while you are on the antibiotic to prevent a serious antibiotic associated diarrhea  Called clostridium dificile colitis and a vaginal yeast infection

## 2014-05-08 NOTE — Progress Notes (Signed)
Patient ID: Brittney Ruiz, female   DOB: August 17, 1965, 48 y.o.   MRN: [id number]  Patient Active Problem List   Diagnosis Date Noted  . Viral URI with cough 05/10/2014  . Sore throat 08/02/2013  . Cough 05/23/2013  . Sinusitis 02/22/2013  . Acute maxillary sinusitis 10/07/2012  . Vertigo 09/22/2012  . Dizziness and giddiness 08/12/2012  . Menorrhagia 08/12/2012  . Other and unspecified hyperlipidemia 08/12/2012  . Medicare annual wellness visit, subsequent 04/21/2012  . Elevated aspartate aminotransferase level 09/28/2011  . Agoraphobia 07/02/2011  . Depression   . Allergy   . Anxiety   . Obesity (BMI 30-39.9)     Subjective:  CC:   Chief Complaint  Patient presents with  . Sore Throat    onset saturday afternoon  . Cough    green yellow mucus    HPI:   Brittney Ruiz is a 48 y.o. female who presents for  symptoms  of sore throat, runny nose,  Productive cough for he past 48 hours.  Reports having green nasal discharge and sputum in the last 24 hours.  Accompanied by left ear pain and dizziness.  Son has been sick.  Has not had the flu vaccine .  Reports some pressure across eyes and maxillary , and some wheezing at night.  Using tylenol and tussin with guaifenesin and antihistamine/benadryl which she stopped because it made her too drowsy,  Waking up coughing.     Past Medical History  Diagnosis Date  . Depression   . Allergy   . Anxiety   . Obesity (BMI 30-39.9)   . Vaginal delivery     X 2    Past Surgical History  Procedure Laterality Date  . Skin cancer excision      melanoma  . Leep         The following portions of the patient's history were reviewed and updated as appropriate: Allergies, current medications, and problem list.    Review of Systems:   Patient denies headache, fevers, malaise, unintentional weight loss, skin rash, eye pain, sinus congestion and sinus pain, sore throat, dysphagia,  hemoptysis , cough, dyspnea, wheezing, chest  pain, palpitations, orthopnea, edema, abdominal pain, nausea, melena, diarrhea, constipation, flank pain, dysuria, hematuria, urinary  Frequency, nocturia, numbness, tingling, seizures,  Focal weakness, Loss of consciousness,  Tremor, insomnia, depression, anxiety, and suicidal ideation.     History   Social History  . Marital Status: Married    Spouse Name: N/A    Number of Children: N/A  . Years of Education: N/A   Occupational History  . Not on file.   Social History Main Topics  . Smoking status: Former Smoker    Quit date: 03/09/2008  . Smokeless tobacco: Never Used  . Alcohol Use: No  . Drug Use: No  . Sexual Activity: Not on file   Other Topics Concern  . Not on file   Social History Narrative    Objective:  Filed Vitals:   05/08/14 1405  BP: 126/74  Pulse: 76  Temp: 98.8 F (37.1 C)  Resp: 16     General appearance: alert, cooperative and appears stated age Ears: normal TM's and external ear canals both ears Throat: lips, mucosa, and tongue normal; teeth and gums normal Neck: no adenopathy, no carotid bruit, supple, symmetrical, trachea midline and thyroid not enlarged, symmetric, no tenderness/mass/nodules Back: symmetric, no curvature. ROM normal. No CVA tenderness. Lungs: clear to auscultation bilaterally Heart: regular rate and rhythm, S1, S2 normal,  no murmur, click, rub or gallop Abdomen: soft, non-tender; bowel sounds normal; no masses,  no organomegaly Pulses: 2+ and symmetric Skin: Skin color, texture, turgor normal. No rashes or lesions Lymph nodes: Cervical, supraclavicular, and axillary nodes normal.  Assessment and Plan:  Viral URI with cough This URI is most likely viral given theild HEENT  symptoms and the presence of symptoms for only 2 days.  I have explained that in viral URIS, an antibiotic will not help the symptoms and will increase the risk of developing diarrhea.,  Continue oral and nasal decongestants,  Ibuprofen 400 mg and  tylenol 650 mq 8 hrs for aches and pains,  And wiadvised to start abx only if symptoms worsen to include fevers, facial pain, and purulent sputum./drainage.    Updated Medication List Outpatient Encounter Prescriptions as of 05/08/2014  Medication Sig  . ACETAMINOPHEN PO Take by mouth as needed.    Marland Kitchen albuterol (PROVENTIL HFA;VENTOLIN HFA) 108 (90 BASE) MCG/ACT inhaler Inhale 2 puffs into the lungs every 6 (six) hours as needed.  . clorazepate (TRANXENE) 7.5 MG tablet Take 7.5 mg by mouth 3 (three) times daily.  [name] Calcium (STOOL SOFTENER PO) Take 1 tablet by mouth as needed.    Marland Kitchen EPINEPHrine (EPIPEN) 0.3 mg/0.3 mL DEVI Inject 0.3 mLs (0.3 mg total) into the muscle once.  Marland Kitchen FIBER SELECT GUMMIES PO Take by mouth.  . fluticasone (FLONASE) 50 MCG/ACT nasal spray Place 2 sprays into the nose daily.  Marland Kitchen loratadine (CLARITIN) 10 MG tablet Take 10 mg by mouth daily.  . Multiple Vitamin (MULTIVITAMIN) tablet Take 1 tablet by mouth daily.  Marland Kitchen OLANZapine (ZYPREXA) 5 MG tablet Take 5 mg by mouth at bedtime.  Marland Kitchen omeprazole (PRILOSEC) 20 MG capsule Take 1 capsule (20 mg total) by mouth daily.  . propranolol (INDERAL) 20 MG tablet Take 20 mg by mouth 3 (three) times daily.  . pseudoephedrine-guaifenesin (TUSSIN PE) 30-100 MG/5ML SYRP Take 5 mLs by mouth every 4 (four) hours as needed.  . sertraline (ZOLOFT) 100 MG tablet Take 150 mg by mouth daily.  . sertraline (ZOLOFT) 25 MG tablet Take 25 mg by mouth 2 (two) times daily.  Marland Kitchen Spacer/Aero Chamber Mouthpiece MISC 1 puff by Does not apply route as needed.  . vitamin C (ASCORBIC ACID) 500 MG tablet Take 1,000 mg by mouth daily.   Marland Kitchen amoxicillin-clavulanate (AUGMENTIN) 875-125 MG per tablet Take 1 tablet by mouth 2 (two) times daily.  . [DISCONTINUED] amoxicillin-clavulanate (AUGMENTIN) 875-125 MG per tablet Take 1 tablet by mouth 2 (two) times daily.  . [DISCONTINUED] fexofenadine (ALLER-EASE) 180 MG tablet Take 1 tablet (180 mg total) by mouth daily.      No orders of the defined types were placed in this encounter.    No Follow-up on file.

## 2014-05-10 ENCOUNTER — Ambulatory Visit: Payer: Medicare Other | Admitting: Internal Medicine

## 2014-05-10 DIAGNOSIS — J069 Acute upper respiratory infection, unspecified: Secondary | ICD-10-CM | POA: Insufficient documentation

## 2014-05-10 DIAGNOSIS — B9789 Other viral agents as the cause of diseases classified elsewhere: Principal | ICD-10-CM

## 2014-05-10 NOTE — Assessment & Plan Note (Signed)
This URI is most likely viral given theild HEENT  symptoms and the presence of symptoms for only 2 days.  I have explained that in viral URIS, an antibiotic will not help the symptoms and will increase the risk of developing diarrhea.,  Continue oral and nasal decongestants,  Ibuprofen 400 mg and tylenol 650 mq 8 hrs for aches and pains,  And wiadvised to start abx only if symptoms worsen to include fevers, facial pain, and purulent sputum./drainage.

## 2014-05-15 ENCOUNTER — Other Ambulatory Visit: Payer: Self-pay

## 2014-05-15 ENCOUNTER — Telehealth: Payer: Self-pay

## 2014-05-15 MED ORDER — PROPRANOLOL HCL 20 MG PO TABS: 20.0000 mg | ORAL_TABLET | Freq: Three times a day (TID) | ORAL | Status: DC

## 2014-05-15 MED ORDER — SERTRALINE HCL 100 MG PO TABS: 150.0000 mg | ORAL_TABLET | Freq: Every day | ORAL | Status: DC

## 2014-05-15 MED ORDER — LORATADINE 10 MG PO TABS: 10.0000 mg | ORAL_TABLET | Freq: Every day | ORAL | Status: DC

## 2014-05-15 NOTE — Telephone Encounter (Signed)
Fine to refill x1 month, then needs follow up visit.

## 2014-05-15 NOTE — Telephone Encounter (Signed)
Paper Rx request received for Zoloft 100mg , loratadine 10mg  and propranolol 20mg . Patient last office visit with PCP was 10/07/12. Patient has been seen by other providers recently. Please advise.

## 2014-05-30 DIAGNOSIS — Z23 Encounter for immunization: Secondary | ICD-10-CM | POA: Diagnosis not present

## 2014-06-01 ENCOUNTER — Encounter: Payer: Self-pay | Admitting: Internal Medicine

## 2014-06-14 ENCOUNTER — Other Ambulatory Visit: Payer: Self-pay | Admitting: Internal Medicine

## 2014-06-14 NOTE — Telephone Encounter (Signed)
Last OV 9.8.15.  Please advise refill

## 2014-06-18 DIAGNOSIS — F419 Anxiety disorder, unspecified: Secondary | ICD-10-CM | POA: Diagnosis not present

## 2014-10-02 DIAGNOSIS — Z79899 Other long term (current) drug therapy: Secondary | ICD-10-CM | POA: Diagnosis not present

## 2014-10-02 DIAGNOSIS — F333 Major depressive disorder, recurrent, severe with psychotic symptoms: Secondary | ICD-10-CM | POA: Diagnosis not present

## 2014-10-02 DIAGNOSIS — F410 Panic disorder [episodic paroxysmal anxiety] without agoraphobia: Secondary | ICD-10-CM | POA: Diagnosis not present

## 2014-10-02 LAB — CBC AND DIFFERENTIAL
HCT: 42 % (ref 36–46)
Hemoglobin: 14.7 g/dL (ref 12.0–16.0)
PLATELETS: 187 10*3/uL (ref 150–399)
WBC: 7.3 10^3/mL

## 2014-10-02 LAB — HEPATIC FUNCTION PANEL
ALT: 32 U/L (ref 7–35)
AST: 35 U/L (ref 13–35)
Bilirubin, Total: 0.9 mg/dL

## 2014-10-02 LAB — LIPID PANEL
Cholesterol: 235 mg/dL — AB (ref 0–200)
HDL: 33 mg/dL — AB (ref 35–70)
LDL Cholesterol: 146 mg/dL
Triglycerides: 282 mg/dL — AB (ref 40–160)

## 2014-10-02 LAB — BASIC METABOLIC PANEL
BUN: 14 mg/dL (ref 4–21)
Creatinine: 0.7 mg/dL (ref 0.5–1.1)
GLUCOSE: 95 mg/dL
Potassium: 4.4 mmol/L (ref 3.4–5.3)
Sodium: 141 mmol/L (ref 137–147)

## 2014-10-02 LAB — HEMOGLOBIN A1C: Hgb A1c MFr Bld: 5.4 % (ref 4.0–6.0)

## 2014-10-02 LAB — TSH: TSH: 1.28 u[IU]/mL (ref 0.41–5.90)

## 2014-10-05 DIAGNOSIS — F419 Anxiety disorder, unspecified: Secondary | ICD-10-CM | POA: Diagnosis not present

## 2014-10-10 ENCOUNTER — Other Ambulatory Visit: Payer: Self-pay | Admitting: Internal Medicine

## 2014-10-10 NOTE — Telephone Encounter (Signed)
Patient last saw a PCP provider on 10/07/12, she has seen a few providers for acute issues in November/December time frame.  Refilled medications on 12.29.15 for one month with need to follow up.  Please advise?

## 2014-10-17 ENCOUNTER — Ambulatory Visit (INDEPENDENT_AMBULATORY_CARE_PROVIDER_SITE_OTHER): Payer: Medicare Other | Admitting: Nurse Practitioner

## 2014-10-17 VITALS — BP 110/84 | HR 71 | Temp 98.5°F | Resp 16 | Ht 62.5 in | Wt 220.1 lb

## 2014-10-17 DIAGNOSIS — B9789 Other viral agents as the cause of diseases classified elsewhere: Secondary | ICD-10-CM

## 2014-10-17 DIAGNOSIS — R3989 Other symptoms and signs involving the genitourinary system: Secondary | ICD-10-CM | POA: Diagnosis not present

## 2014-10-17 DIAGNOSIS — J069 Acute upper respiratory infection, unspecified: Secondary | ICD-10-CM

## 2014-10-17 DIAGNOSIS — R399 Unspecified symptoms and signs involving the genitourinary system: Secondary | ICD-10-CM | POA: Diagnosis not present

## 2014-10-17 DIAGNOSIS — R21 Rash and other nonspecific skin eruption: Secondary | ICD-10-CM | POA: Diagnosis not present

## 2014-10-17 LAB — POCT URINALYSIS DIPSTICK
BILIRUBIN UA: NEGATIVE
GLUCOSE UA: NEGATIVE
Ketones, UA: NEGATIVE
Nitrite, UA: POSITIVE
PH UA: 5.5
Protein, UA: NEGATIVE
Spec Grav, UA: 1.015
Urobilinogen, UA: 0.2

## 2014-10-17 MED ORDER — LORATADINE 10 MG PO TABS: ORAL_TABLET | ORAL | Status: DC

## 2014-10-17 MED ORDER — NYSTATIN 100000 UNIT/GM EX POWD: Freq: Two times a day (BID) | CUTANEOUS | Status: DC

## 2014-10-17 MED ORDER — OMEPRAZOLE 20 MG PO CPDR: 20.0000 mg | DELAYED_RELEASE_CAPSULE | Freq: Every day | ORAL | Status: DC

## 2014-10-17 MED ORDER — NITROFURANTOIN MONOHYD MACRO 100 MG PO CAPS: 100.0000 mg | ORAL_CAPSULE | Freq: Two times a day (BID) | ORAL | Status: DC

## 2014-10-17 NOTE — Patient Instructions (Signed)
Take twice daily for 5 days.   Please take a probiotic ( Align, Floraque or Culturelle) while you are on the antibiotic to prevent a serious antibiotic associated diarrhea  Called clostirudium dificile colitis and a vaginal yeast infection.  Try the Nystatin powder under breasts  Benadryl at night for helping with drainage.

## 2014-10-17 NOTE — Progress Notes (Signed)
   Subjective:    Patient ID: Brittney Ruiz, female    DOB: 09-Dec-1965, 49 y.o.   MRN: [id number]  HPI  Brittney Ruiz is a 49 yo female with a CC of UTI and medication refills.   1) Dysuria, twinge on lower left side, odor and cloudy.   2) Sore throat- this morning, dry non-productive cough   3) Rash under breasts    Review of Systems  Constitutional: Negative for fever, chills, diaphoresis and fatigue.  Eyes: Negative for visual disturbance.  Respiratory: Negative for chest tightness, shortness of breath and wheezing.   Cardiovascular: Negative for chest pain, palpitations and leg swelling.  Gastrointestinal: Negative for nausea, vomiting and diarrhea.  Genitourinary: Positive for dysuria and flank pain.       Cloudy and malodorous urine  Skin: Positive for rash and wound.      Objective:   Physical Exam  Constitutional: She is oriented to person, place, and time. She appears well-developed and well-nourished. No distress.  BP 110/84 mmHg  Pulse 71  Temp(Src) 98.5 F (36.9 C)  Resp 16  Ht 5' 2.5" (1.588 m)  Wt 220 lb 1.9 oz (99.846 kg)  BMI 39.59 kg/m2  SpO2 95%   HENT:  Head: Normocephalic and atraumatic.  Right Ear: External ear normal.  Left Ear: External ear normal.  Eyes: Right eye exhibits no discharge. Left eye exhibits no discharge. No scleral icterus.  Cardiovascular: Normal rate, regular rhythm and normal heart sounds.   Pulmonary/Chest: Effort normal and breath sounds normal. No respiratory distress. She has no wheezes. She has no rales. She exhibits no tenderness.  Abdominal: There is no CVA tenderness.  Neurological: She is alert and oriented to person, place, and time. No cranial nerve deficit. She exhibits normal muscle tone. Coordination normal.  Skin: Skin is warm and dry. Rash noted. She is not diaphoretic. There is erythema.  Papular confluent rash under breasts bilaterally, erythematous and well demarcated from non-affected areas.   Psychiatric:  She has a normal mood and affect. Her behavior is normal. Judgment and thought content normal.      Assessment & Plan:

## 2014-10-20 LAB — URINE CULTURE: Colony Count: >=100000

## 2014-10-31 ENCOUNTER — Encounter: Payer: Self-pay | Admitting: Nurse Practitioner

## 2014-10-31 ENCOUNTER — Telehealth: Payer: Self-pay | Admitting: *Deleted

## 2014-10-31 DIAGNOSIS — R21 Rash and other nonspecific skin eruption: Secondary | ICD-10-CM | POA: Insufficient documentation

## 2014-10-31 DIAGNOSIS — R399 Unspecified symptoms and signs involving the genitourinary system: Secondary | ICD-10-CM | POA: Insufficient documentation

## 2014-10-31 NOTE — Assessment & Plan Note (Signed)
Rash under breasts suspicious for yeast. Asked pt to keep dry (hair dryer after showering helps) and try nystatin powder. FU prn worsening/failure to improve.

## 2014-10-31 NOTE — Assessment & Plan Note (Signed)
Asked pt to try benadryl at night to curb PNDrip. Will follow if worsening or fever.

## 2014-10-31 NOTE — Telephone Encounter (Signed)
Pt called states she was seen on 6.1.16 and treated for UTI.  Pt states has completed the medication  However she is still having the urinary urgency, foul odor and dark yellow.  Pt says she is in [place] and is unable to come in for appoint.  [facility] [phone] phone number, [address] [place], [place] is the closest pharmacy to her.

## 2014-10-31 NOTE — Assessment & Plan Note (Signed)
POCT urine looks suspicious for UTI, will treat with Macrobid twice daily for 5 days and will culture. Will follow after culture.

## 2014-11-02 NOTE — Telephone Encounter (Signed)
If she has a recurrent UTI after finishing the antibiotics she will need to be re-evaluated.

## 2014-11-02 NOTE — Telephone Encounter (Signed)
Needs to be seen if persistent urinary symptoms.

## 2014-11-02 NOTE — Telephone Encounter (Signed)
Spoke with Brittney Ruiz, advised she would need to be re evaluated.  Brittney Ruiz disagreed with decision.  Brittney Ruiz wanted message sent to Dr Gilford Rile.  Please advise

## 2014-11-02 NOTE — Telephone Encounter (Signed)
Dr. Gilford Rile CC'd on this note- she is refusing to be seen after being treated for a UTI, she is not in state, currently in [place]. Last OV 6/1 with me. She is arguing with Nitchia about re-evaluation.

## 2014-11-05 NOTE — Telephone Encounter (Signed)
Pt aware she needs to be seen

## 2014-12-01 DIAGNOSIS — N760 Acute vaginitis: Secondary | ICD-10-CM | POA: Diagnosis not present

## 2014-12-01 DIAGNOSIS — R300 Dysuria: Secondary | ICD-10-CM | POA: Diagnosis not present

## 2014-12-01 DIAGNOSIS — R05 Cough: Secondary | ICD-10-CM | POA: Diagnosis not present

## 2015-01-05 ENCOUNTER — Other Ambulatory Visit: Payer: Self-pay | Admitting: Nurse Practitioner

## 2015-04-01 DIAGNOSIS — Z23 Encounter for immunization: Secondary | ICD-10-CM | POA: Diagnosis not present

## 2015-04-16 ENCOUNTER — Other Ambulatory Visit: Payer: Self-pay | Admitting: Internal Medicine

## 2015-04-16 DIAGNOSIS — Z1231 Encounter for screening mammogram for malignant neoplasm of breast: Secondary | ICD-10-CM

## 2015-04-22 ENCOUNTER — Telehealth: Payer: Self-pay | Admitting: *Deleted

## 2015-04-22 DIAGNOSIS — N898 Other specified noninflammatory disorders of vagina: Secondary | ICD-10-CM

## 2015-04-22 NOTE — Telephone Encounter (Signed)
Patient has requested a call back in reference to going to [facility]. Patient stated that she has a cyst in her vagina, Patient has requested a referral for her cyst.  Please Advise.

## 2015-04-23 ENCOUNTER — Telehealth: Payer: Self-pay | Admitting: Internal Medicine

## 2015-04-23 NOTE — Telephone Encounter (Signed)
I called pt to let her know that the referral has been made to [facility]. She states that [facility] told her that they may not be able to get her in before the end of the year as she was needing. She said that they told her that her PCP may need to go ahead and see her for her "cyst" for biopsy and her odor. I told her that Dr. Gilford Rile did not do biopsies. I offered to send her to another GYN (since she saw [facility] last year) and she declined. She states that she needs to be seen before the end of the year since she has had cervical cancer with a couple of leeps and states that her cyst is where her leeps and cervical cancer was.  Please advise. Thanks,

## 2015-04-24 NOTE — Telephone Encounter (Signed)
I am not sure what to offer aside from a local OB who can perform biopsy and/or incision and drainage. I would recommend that she consider local referral if [facility] is scheduled so far out.

## 2015-04-24 NOTE — Telephone Encounter (Signed)
Brittney Ruiz, can you assist with options locally? As a option?

## 2015-04-24 NOTE — Telephone Encounter (Signed)
Dr Walker, please advise :)

## 2015-04-25 DIAGNOSIS — N819 Female genital prolapse, unspecified: Secondary | ICD-10-CM | POA: Diagnosis not present

## 2015-04-25 DIAGNOSIS — Z6839 Body mass index (BMI) 39.0-39.9, adult: Secondary | ICD-10-CM | POA: Diagnosis not present

## 2015-04-25 DIAGNOSIS — N920 Excessive and frequent menstruation with regular cycle: Secondary | ICD-10-CM | POA: Diagnosis not present

## 2015-05-03 DIAGNOSIS — L812 Freckles: Secondary | ICD-10-CM | POA: Diagnosis not present

## 2015-05-03 DIAGNOSIS — Z85820 Personal history of malignant melanoma of skin: Secondary | ICD-10-CM | POA: Diagnosis not present

## 2015-05-03 DIAGNOSIS — L820 Inflamed seborrheic keratosis: Secondary | ICD-10-CM | POA: Diagnosis not present

## 2015-05-03 DIAGNOSIS — L304 Erythema intertrigo: Secondary | ICD-10-CM | POA: Diagnosis not present

## 2015-05-03 DIAGNOSIS — D1801 Hemangioma of skin and subcutaneous tissue: Secondary | ICD-10-CM | POA: Diagnosis not present

## 2015-05-03 DIAGNOSIS — D225 Melanocytic nevi of trunk: Secondary | ICD-10-CM | POA: Diagnosis not present

## 2015-05-06 ENCOUNTER — Other Ambulatory Visit: Payer: Self-pay | Admitting: Internal Medicine

## 2015-05-06 ENCOUNTER — Ambulatory Visit
Admission: RE | Admit: 2015-05-06 | Discharge: 2015-05-06 | Disposition: A | Discharge status: Home / Self Care | Payer: Medicare Other | Source: Ambulatory Visit | Attending: Internal Medicine | Admitting: Internal Medicine

## 2015-05-06 DIAGNOSIS — Z1231 Encounter for screening mammogram for malignant neoplasm of breast: Secondary | ICD-10-CM

## 2015-05-07 LAB — HM MAMMOGRAPHY: HM Mammogram: NORMAL

## 2015-05-16 DIAGNOSIS — N8300 Follicular cyst of ovary, unspecified side: Secondary | ICD-10-CM | POA: Diagnosis not present

## 2015-05-16 DIAGNOSIS — Z833 Family history of diabetes mellitus: Secondary | ICD-10-CM | POA: Diagnosis not present

## 2015-05-16 DIAGNOSIS — N920 Excessive and frequent menstruation with regular cycle: Secondary | ICD-10-CM | POA: Diagnosis not present

## 2015-05-16 DIAGNOSIS — D259 Leiomyoma of uterus, unspecified: Secondary | ICD-10-CM | POA: Diagnosis not present

## 2015-05-16 DIAGNOSIS — B372 Candidiasis of skin and nail: Secondary | ICD-10-CM | POA: Diagnosis not present

## 2015-05-16 DIAGNOSIS — R938 Abnormal findings on diagnostic imaging of other specified body structures: Secondary | ICD-10-CM | POA: Diagnosis not present

## 2015-05-28 ENCOUNTER — Ambulatory Visit (INDEPENDENT_AMBULATORY_CARE_PROVIDER_SITE_OTHER): Payer: Medicare Other | Admitting: Internal Medicine

## 2015-05-28 ENCOUNTER — Encounter: Payer: Self-pay | Admitting: Internal Medicine

## 2015-05-28 VITALS — BP 117/87 | HR 62 | Temp 97.9°F | Ht 62.75 in | Wt 217.1 lb

## 2015-05-28 DIAGNOSIS — N924 Excessive bleeding in the premenopausal period: Secondary | ICD-10-CM | POA: Diagnosis not present

## 2015-05-28 DIAGNOSIS — E785 Hyperlipidemia, unspecified: Secondary | ICD-10-CM

## 2015-05-28 DIAGNOSIS — E669 Obesity, unspecified: Secondary | ICD-10-CM | POA: Diagnosis not present

## 2015-05-28 DIAGNOSIS — F419 Anxiety disorder, unspecified: Secondary | ICD-10-CM

## 2015-05-28 LAB — LIPID PANEL
CHOL/HDL RATIO: 7
CHOLESTEROL: 247 mg/dL — AB (ref 0–200)
HDL: 33 mg/dL — AB (ref >39.00)
NonHDL: 213.94
TRIGLYCERIDES: 255 mg/dL — AB (ref 0.0–149.0)
VLDL: 51 mg/dL — ABNORMAL HIGH (ref 0.0–40.0)

## 2015-05-28 LAB — COMPREHENSIVE METABOLIC PANEL
ALBUMIN: 4.4 g/dL (ref 3.5–5.2)
ALT: 26 U/L (ref 0–35)
AST: 22 U/L (ref 0–37)
Alkaline Phosphatase: 62 U/L (ref 39–117)
BUN: 14 mg/dL (ref 6–23)
CALCIUM: 9.5 mg/dL (ref 8.4–10.5)
CHLORIDE: 101 meq/L (ref 96–112)
CO2: 29 mEq/L (ref 19–32)
CREATININE: 0.68 mg/dL (ref 0.40–1.20)
GFR: 98.03 mL/min (ref >60.00)
Glucose, Bld: 103 mg/dL — ABNORMAL HIGH (ref 70–99)
POTASSIUM: 4.2 meq/L (ref 3.5–5.1)
Sodium: 137 mEq/L (ref 135–145)
Total Bilirubin: 0.6 mg/dL (ref 0.2–1.2)
Total Protein: 6.6 g/dL (ref 6.0–8.3)

## 2015-05-28 LAB — HEMOGLOBIN A1C: HEMOGLOBIN A1C: 5.4 % (ref 4.6–6.5)

## 2015-05-28 LAB — LDL CHOLESTEROL, DIRECT: Direct LDL: 166 mg/dL

## 2015-05-28 LAB — TSH: TSH: 1.55 u[IU]/mL (ref 0.35–4.50)

## 2015-05-28 NOTE — Assessment & Plan Note (Signed)
Symptoms well controlled on current medications. Followed by psychiatry.

## 2015-05-28 NOTE — Progress Notes (Signed)
Subjective:    Patient ID: Brittney Ruiz, female    DOB: 1965-09-10, 50 y.o.   MRN: [id number]  HPI  50YO female presents for follow up. Last seen by me in 2014.  Menorrhagia - Seen by GYN in December. Had endometrial biopsy which was normal. Recommended evaluation for diabetes because of increased thirst. Also concern about thyroid function. CBC at that time was normal. Recommended pelvic floor PT.  Continues to have some irregular menses. At times has heavy clotting during menses.  Discussed IUD and ablation and hysterectomy with OB. Planning to follow up in 2-3 months with follow up US.   Anxiety - Symptoms have been well controlled on current medication. Weaned off of Zyprexa because of weight gain. Continues to follow every 6 months with psychiatry.  Obesity - Max weight on Zyprexa was 230lb. Set goal of walking daily. Working on limiting sugar intake.    Wt Readings from Last 3 Encounters:  05/28/15 217 lb 2 oz (98.487 kg)  10/17/14 220 lb 1.9 oz (99.846 kg)  05/08/14 210 lb 12 oz (95.596 kg)   BP Readings from Last 3 Encounters:  05/28/15 117/87  10/17/14 110/84  05/08/14 126/74    Past Medical History  Diagnosis Date  . Depression   . Allergy   . Anxiety   . Obesity (BMI 30-39.9)   . Vaginal delivery     X 2   Family History  Problem Relation Age of Onset  . Hyperlipidemia Mother   . Hypertension Mother   . Diabetes Mother   . Cancer Father     lung  . Depression Father   . Hyperlipidemia Sister   . Hypertension Sister   . Migraines Sister   . Hypertension Brother   . Hyperlipidemia Brother   . Migraines Brother   . Cancer Maternal Grandmother     brain  . Depression Maternal Grandfather   . Heart disease Maternal Grandfather   . Cancer Paternal Grandfather     unknown  . Breast cancer Neg Hx    Past Surgical History  Procedure Laterality Date  . Skin cancer excision      melanoma  . Leep     Social History   Social History  .  Marital Status: Married    Spouse Name: N/A  . Number of Children: N/A  . Years of Education: N/A   Social History Main Topics  . Smoking status: Former Smoker    Quit date: 03/09/2008  . Smokeless tobacco: Never Used  . Alcohol Use: No  . Drug Use: No  . Sexual Activity: Not Asked   Other Topics Concern  . None   Social History Narrative    Review of Systems  Constitutional: Negative for fever, chills, appetite change, fatigue and unexpected weight change.  Eyes: Negative for visual disturbance.  Respiratory: Negative for shortness of breath.   Cardiovascular: Negative for chest pain and leg swelling.  Gastrointestinal: Negative for nausea, vomiting, abdominal pain, diarrhea and constipation.  Genitourinary: Positive for menstrual problem. Negative for frequency, vaginal bleeding, vaginal discharge, vaginal pain and pelvic pain.  Musculoskeletal: Negative for myalgias and arthralgias.  Skin: Negative for color change and rash.  Hematological: Negative for adenopathy. Does not bruise/bleed easily.  Psychiatric/Behavioral: Negative for suicidal ideas and dysphoric mood. The patient is nervous/anxious.        Objective:    BP 117/87 mmHg  Pulse 62  Temp(Src) 97.9 F (36.6 C) (Oral)  Ht 5' 2.75" (1.594 m)  Wt 217 lb 2 oz (98.487 kg)  BMI 38.76 kg/m2  SpO2 94%  LMP 05/06/2015 Physical Exam  Constitutional: She is oriented to person, place, and time. She appears well-developed and well-nourished. No distress.  HENT:  Head: Normocephalic and atraumatic.  Right Ear: External ear normal.  Left Ear: External ear normal.  Nose: Nose normal.  Mouth/Throat: Oropharynx is clear and moist. No oropharyngeal exudate.  Eyes: Conjunctivae are normal. Pupils are equal, round, and reactive to light. Right eye exhibits no discharge. Left eye exhibits no discharge. No scleral icterus.  Neck: Normal range of motion. Neck supple. No tracheal deviation present. No thyromegaly present.    Cardiovascular: Normal rate, regular rhythm, normal heart sounds and intact distal pulses.  Exam reveals no gallop and no friction rub.   No murmur heard. Pulmonary/Chest: Effort normal and breath sounds normal. No respiratory distress. She has no wheezes. She has no rales. She exhibits no tenderness.  Musculoskeletal: Normal range of motion. She exhibits no edema or tenderness.  Lymphadenopathy:    She has no cervical adenopathy.  Neurological: She is alert and oriented to person, place, and time. No cranial nerve deficit. She exhibits normal muscle tone. Coordination normal.  Skin: Skin is warm and dry. No rash noted. She is not diaphoretic. No erythema. No pallor.  Psychiatric: She has a normal mood and affect. Her behavior is normal. Judgment and thought content normal.          Assessment & Plan:   Problem List Items Addressed This Visit      Unprioritized   Anxiety - Primary    Symptoms well controlled on current medications. Followed by psychiatry.      Hyperlipidemia    Will check lipids and LFTs with labs.      Relevant Orders   Comprehensive metabolic panel   Lipid panel   Menorrhagia    Menorrhagia, s/p endometrial biopsy which was reportedly normal. Will request records from OB. Discussed options of ablation and hysterectomy. Follow up after visit with OB/GYN.      Obesity (BMI 30-39.9)    Wt Readings from Last 3 Encounters:  05/28/15 217 lb 2 oz (98.487 kg)  10/17/14 220 lb 1.9 oz (99.846 kg)  05/08/14 210 lb 12 oz (95.596 kg)   Encouraged healthy diet and exercise with goal of weight loss. Will check A1c and TSH with labs.      Relevant Orders   Hemoglobin A1c   TSH       Return in about 4 weeks (around 06/25/2015) for Recheck.

## 2015-05-28 NOTE — Assessment & Plan Note (Signed)
Menorrhagia, s/p endometrial biopsy which was reportedly normal. Will request records from OB. Discussed options of ablation and hysterectomy. Follow up after visit with OB/GYN.

## 2015-05-28 NOTE — Progress Notes (Signed)
Pre visit review using our clinic review tool, if applicable. No additional management support is needed unless otherwise documented below in the visit note. 

## 2015-05-28 NOTE — Patient Instructions (Addendum)
Labs today.  Follow up with gynecology as scheduled.  Follow up in 3 months.

## 2015-05-28 NOTE — Assessment & Plan Note (Signed)
Wt Readings from Last 3 Encounters:  05/28/15 217 lb 2 oz (98.487 kg)  10/17/14 220 lb 1.9 oz (99.846 kg)  05/08/14 210 lb 12 oz (95.596 kg)   Encouraged healthy diet and exercise with goal of weight loss. Will check A1c and TSH with labs.

## 2015-05-28 NOTE — Assessment & Plan Note (Signed)
Will check lipids and LFTs with labs. 

## 2015-05-30 ENCOUNTER — Other Ambulatory Visit: Payer: Self-pay | Admitting: *Deleted

## 2015-05-30 MED ORDER — ATORVASTATIN CALCIUM 20 MG PO TABS: 20.0000 mg | ORAL_TABLET | Freq: Every day | ORAL | Status: DC

## 2015-07-08 ENCOUNTER — Other Ambulatory Visit: Payer: Medicare Other

## 2015-07-16 ENCOUNTER — Other Ambulatory Visit: Payer: Self-pay | Admitting: Internal Medicine

## 2015-07-16 MED ORDER — PROPRANOLOL HCL 20 MG PO TABS: 20.0000 mg | ORAL_TABLET | Freq: Three times a day (TID) | ORAL | Status: AC

## 2015-07-16 NOTE — Addendum Note (Signed)
Addended by: Carmin Muskrat on: 07/16/2015 04:26 PM   Modules accepted: Orders

## 2015-07-16 NOTE — Telephone Encounter (Signed)
Historical medication. Please advise? 

## 2015-07-16 NOTE — Addendum Note (Signed)
Addended by: Carmin Muskrat on: 07/16/2015 04:25 PM   Modules accepted: Orders

## 2015-07-19 DIAGNOSIS — F324 Major depressive disorder, single episode, in partial remission: Secondary | ICD-10-CM | POA: Diagnosis not present

## 2015-07-19 DIAGNOSIS — F419 Anxiety disorder, unspecified: Secondary | ICD-10-CM | POA: Diagnosis not present

## 2015-07-23 ENCOUNTER — Other Ambulatory Visit: Payer: Self-pay

## 2015-07-23 MED ORDER — OMEPRAZOLE 20 MG PO CPDR: 20.0000 mg | DELAYED_RELEASE_CAPSULE | Freq: Every day | ORAL | Status: DC

## 2015-07-24 ENCOUNTER — Telehealth: Payer: Self-pay

## 2015-07-24 NOTE — Telephone Encounter (Signed)
Yes. Stop Atorvastatin. Set up 41min follow up visit in next few weeks.

## 2015-07-24 NOTE — Telephone Encounter (Signed)
Pt states that she started taking her Lipitor 20mg   2 weeks ago and pt states that she feels she is having side effects. Pt is c/o headaches, blurred vision, constipation, joint pain -9, muscle aches-9, and weight gain of 7lbs. Pt wants to know if she should stop taking. Please advise, thanks

## 2015-07-24 NOTE — Telephone Encounter (Signed)
Notified the pt of Dr. Thomes Dinning comments. Pt verbalized understanding and appreciation. Pt is scheduled to come in for f/u 08/09/15

## 2015-08-09 ENCOUNTER — Encounter: Payer: Self-pay | Admitting: Internal Medicine

## 2015-08-09 ENCOUNTER — Ambulatory Visit (INDEPENDENT_AMBULATORY_CARE_PROVIDER_SITE_OTHER): Payer: Medicare Other | Admitting: Internal Medicine

## 2015-08-09 VITALS — BP 106/71 | HR 64 | Temp 97.7°F | Ht 63.0 in | Wt 218.1 lb

## 2015-08-09 DIAGNOSIS — R079 Chest pain, unspecified: Secondary | ICD-10-CM | POA: Diagnosis not present

## 2015-08-09 DIAGNOSIS — N649 Disorder of breast, unspecified: Secondary | ICD-10-CM | POA: Diagnosis not present

## 2015-08-09 DIAGNOSIS — E785 Hyperlipidemia, unspecified: Secondary | ICD-10-CM | POA: Diagnosis not present

## 2015-08-09 DIAGNOSIS — F4001 Agoraphobia with panic disorder: Secondary | ICD-10-CM

## 2015-08-09 MED ORDER — GENTAMICIN SULFATE 0.1 % EX OINT: 1.0000 "application " | TOPICAL_OINTMENT | Freq: Three times a day (TID) | CUTANEOUS | Status: DC

## 2015-08-09 NOTE — Assessment & Plan Note (Signed)
Symptoms well controlled on current medication. Continue follow up with psychiatry.

## 2015-08-09 NOTE — Progress Notes (Signed)
Pre visit review using our clinic review tool, if applicable. No additional management support is needed unless otherwise documented below in the visit note. 

## 2015-08-09 NOTE — Progress Notes (Signed)
Subjective:    Patient ID: Brittney Ruiz, female    DOB: Sep 30, 1965, 50 y.o.   MRN: [id number]  HPI  50YO female presents for follow up.  Stopped taking Lipitor because of headache, concentration, weight gain. Symptoms improved after stopping medication.  Started back walking. Following a healthier diet. With walking, HR was 189. Resting heart rate has been in 60s. Occasional left sided chest pain when walking. Lasts a few min and resolves with rest. Sometimes improved with applying pressure to the chest wall. Some dyspnea with walking.  No palpitations. No previous evaluation for heart disease.  Breast lesion - Skin lesion left breast noted a few weeks ago. Area is red, but not tender. No drainage noted.  Not taking anything for this.   Wt Readings from Last 3 Encounters:  08/09/15 218 lb 2 oz (98.941 kg)  05/28/15 217 lb 2 oz (98.487 kg)  10/17/14 220 lb 1.9 oz (99.846 kg)   BP Readings from Last 3 Encounters:  08/09/15 106/71  05/28/15 117/87  10/17/14 110/84    Past Medical History  Diagnosis Date  . Depression   . Allergy   . Anxiety   . Obesity (BMI 30-39.9)   . Vaginal delivery     X 2   Family History  Problem Relation Age of Onset  . Hyperlipidemia Mother   . Hypertension Mother   . Diabetes Mother   . Cancer Father     lung  . Depression Father   . Hyperlipidemia Sister   . Hypertension Sister   . Migraines Sister   . Hypertension Brother   . Hyperlipidemia Brother   . Migraines Brother   . Cancer Maternal Grandmother     brain  . Depression Maternal Grandfather   . Heart disease Maternal Grandfather   . Cancer Paternal Grandfather     unknown  . Breast cancer Neg Hx    Past Surgical History  Procedure Laterality Date  . Skin cancer excision      melanoma  . Leep     Social History   Social History  . Marital Status: Married    Spouse Name: N/A  . Number of Children: N/A  . Years of Education: N/A   Social History Main Topics  .  Smoking status: Former Smoker    Quit date: 03/09/2008  . Smokeless tobacco: Never Used  . Alcohol Use: No  . Drug Use: No  . Sexual Activity: Not Asked   Other Topics Concern  . None   Social History Narrative    Review of Systems  Constitutional: Negative for fever, chills, appetite change, fatigue and unexpected weight change.  Eyes: Negative for visual disturbance.  Respiratory: Positive for shortness of breath. Negative for cough, chest tightness and wheezing.   Cardiovascular: Positive for chest pain. Negative for palpitations and leg swelling.  Gastrointestinal: Negative for nausea, vomiting, abdominal pain, diarrhea and constipation.  Musculoskeletal: Negative for myalgias and arthralgias.  Skin: Negative for color change and rash.  Neurological: Negative for weakness.  Hematological: Negative for adenopathy. Does not bruise/bleed easily.  Psychiatric/Behavioral: Negative for sleep disturbance and dysphoric mood. The patient is not nervous/anxious.        Objective:    BP 106/71 mmHg  Pulse 64  Temp(Src) 97.7 F (36.5 C) (Oral)  Ht 5\' 3"  (1.6 m)  Wt 218 lb 2 oz (98.941 kg)  BMI 38.65 kg/m2  SpO2 95%  LMP 08/08/2015 Physical Exam  Constitutional: She is oriented to person, place,  and time. She appears well-developed and well-nourished. No distress.  HENT:  Head: Normocephalic and atraumatic.  Right Ear: External ear normal.  Left Ear: External ear normal.  Nose: Nose normal.  Mouth/Throat: Oropharynx is clear and moist. No oropharyngeal exudate.  Eyes: Conjunctivae are normal. Pupils are equal, round, and reactive to light. Right eye exhibits no discharge. Left eye exhibits no discharge. No scleral icterus.  Neck: Normal range of motion. Neck supple. No tracheal deviation present. No thyromegaly present.  Cardiovascular: Normal rate, regular rhythm, normal heart sounds and intact distal pulses.  Exam reveals no gallop and no friction rub.   No murmur  heard. Pulmonary/Chest: Effort normal and breath sounds normal. No accessory muscle usage. No respiratory distress. She has no decreased breath sounds. She has no wheezes. She has no rhonchi. She has no rales. She exhibits no tenderness. Right breast exhibits no inverted nipple, no mass, no nipple discharge, no skin change and no tenderness. Left breast exhibits skin change. Left breast exhibits no inverted nipple, no mass, no nipple discharge and no tenderness. Breasts are symmetrical.    Musculoskeletal: Normal range of motion. She exhibits no edema or tenderness.  Lymphadenopathy:    She has no cervical adenopathy.  Neurological: She is alert and oriented to person, place, and time. No cranial nerve deficit. She exhibits normal muscle tone. Coordination normal.  Skin: Skin is warm and dry. No rash noted. She is not diaphoretic. No erythema. No pallor.  Psychiatric: She has a normal mood and affect. Her behavior is normal. Judgment and thought content normal.          Assessment & Plan:  Over 42min of which >50% spent in face-to-face contact with patient discussing plan of care, and further evaluation and management of medical issues.  Problem List Items Addressed This Visit      Unprioritized   Agoraphobia with panic disorder    Symptoms well controlled on current medication. Continue follow up with psychiatry.      Breast lesion    Left breast lesion. Consistent with folliculitis. Will start topical gentamicin. Recheck in 4 weeks and prn.      Relevant Medications   gentamicin ointment (GARAMYCIN) 0.1 %   Hyperlipidemia    Unable to tolerate Lipitor. Will recheck lipids and LFTs with labs.       Relevant Orders   Lipid panel   Comprehensive metabolic panel   Pain in the chest - Primary    Left sided chest pain with exertion. Risks for CAD including obesity, sedentary lifestyle and hyperlipidemia. EKG today showed NSR. Will set up evaluation with cardiology for possible  stress test.      Relevant Orders   EKG 12-Lead (Completed)   Ambulatory referral to Cardiology       Return in about 4 weeks (around 09/06/2015) for Recheck.  Ronette Deter, MD [facility]

## 2015-08-09 NOTE — Assessment & Plan Note (Signed)
Left sided chest pain with exertion. Risks for CAD including obesity, sedentary lifestyle and hyperlipidemia. EKG today showed NSR. Will set up evaluation with cardiology for possible stress test.

## 2015-08-09 NOTE — Patient Instructions (Addendum)
Labs today.  We will set up evaluation with cardiology.  Start Gentamicin ointment to breast. We will recheck this in 4 weeks.  Follow up in 4 weeks.

## 2015-08-09 NOTE — Assessment & Plan Note (Signed)
Unable to tolerate Lipitor. Will recheck lipids and LFTs with labs.

## 2015-08-09 NOTE — Assessment & Plan Note (Signed)
Left breast lesion. Consistent with folliculitis. Will start topical gentamicin. Recheck in 4 weeks and prn.

## 2015-08-10 LAB — COMPREHENSIVE METABOLIC PANEL
ALBUMIN: 4.3 g/dL (ref 3.6–5.1)
ALT: 24 U/L (ref 6–29)
AST: 25 U/L (ref 10–35)
Alkaline Phosphatase: 57 U/L (ref 33–115)
BILIRUBIN TOTAL: 0.5 mg/dL (ref 0.2–1.2)
BUN: 12 mg/dL (ref 7–25)
CALCIUM: 9.2 mg/dL (ref 8.6–10.2)
CHLORIDE: 99 mmol/L (ref 98–110)
CO2: 27 mmol/L (ref 20–31)
CREATININE: 0.73 mg/dL (ref 0.50–1.10)
GLUCOSE: 88 mg/dL (ref 65–99)
Potassium: 4.5 mmol/L (ref 3.5–5.3)
SODIUM: 141 mmol/L (ref 135–146)
Total Protein: 6.4 g/dL (ref 6.1–8.1)

## 2015-08-10 LAB — LIPID PANEL
CHOL/HDL RATIO: 7.5 ratio — AB (ref <=5.0)
Cholesterol: 232 mg/dL — ABNORMAL HIGH (ref 125–200)
HDL: 31 mg/dL — ABNORMAL LOW (ref >=46)
LDL CALC: 160 mg/dL — AB (ref <130)
TRIGLYCERIDES: 205 mg/dL — AB (ref <150)
VLDL: 41 mg/dL — AB (ref <30)

## 2015-08-26 ENCOUNTER — Ambulatory Visit: Payer: Medicare Other | Admitting: Internal Medicine

## 2015-08-26 ENCOUNTER — Encounter: Payer: Self-pay | Admitting: Cardiology

## 2015-08-26 ENCOUNTER — Ambulatory Visit (INDEPENDENT_AMBULATORY_CARE_PROVIDER_SITE_OTHER): Payer: Medicare Other | Admitting: Cardiology

## 2015-08-26 VITALS — BP 106/70 | HR 61 | Ht 62.0 in | Wt 217.8 lb

## 2015-08-26 DIAGNOSIS — R079 Chest pain, unspecified: Secondary | ICD-10-CM

## 2015-08-26 DIAGNOSIS — R0600 Dyspnea, unspecified: Secondary | ICD-10-CM | POA: Diagnosis not present

## 2015-08-26 DIAGNOSIS — E785 Hyperlipidemia, unspecified: Secondary | ICD-10-CM

## 2015-08-26 NOTE — Progress Notes (Signed)
Cardiology Office Note   Date:  08/26/2015   ID:  Brittney Ruiz, DOB 12-Sep-1965, MRN [id number]  Referring Doctor:  Rica Mast, MD   Cardiologist:   Wende Bushy, MD   Reason for consultation:  Chief Complaint  Patient presents with  . Establish Care    was having CP, no active CP now      History of Present Illness: Brittney Ruiz is a 50 y.o. female who presents for chest pain and shortness of breath.  Approximately 2 weeks ago, patient was walking up her road as part of her exercise, daily walking to complete 10,000 steps per day, when she suddenly experienced a dull pain on the left side of the chest, radiating down to the left arm, with shortness of breath. Symptoms were severe, lasted a few minutes, eventually resolve with rest. She's never experienced this before with exercise. Also, she thought that her fitbit registered pulse in the 180s.  She reports shortness of breath with minimal exertion. This has not happened to her until only a couple weeks ago. She has been trying to lose weight and embark on this lifestyle change but unfortunately, it is during the last 2 weeks when she developed chest pain and shortness of breath.  Patient denies palpitations, loss of consciousness, orthopnea, PND, edema. No headache, fever, cough, colds, abdominal pain.   ROS:  Please see the history of present illness. Aside from mentioned under HPI, all other systems are reviewed and negative.     Past Medical History  Diagnosis Date  . Depression   . Allergy   . Anxiety   . Obesity (BMI 30-39.9)   . Vaginal delivery     X 2    Past Surgical History  Procedure Laterality Date  . Skin cancer excision      melanoma  . Leep       reports that she quit smoking about 7 years ago. She has never used smokeless tobacco. She reports that she does not drink alcohol or use illicit drugs.   family history includes Cancer in her father, maternal grandmother, and  paternal grandfather; Depression in her father and maternal grandfather; Diabetes in her mother; Heart disease in her maternal grandfather; Hyperlipidemia in her brother, mother, and sister; Hypertension in her brother, mother, and sister; Migraines in her brother and sister. There is no history of Breast cancer.  Grandfather had CAD in his 62s. Paternal uncle had CAD in his 40s.  Current Outpatient Prescriptions  Medication Sig Dispense Refill  . ACETAMINOPHEN PO Take by mouth as needed.      Marland Kitchen albuterol (PROVENTIL HFA;VENTOLIN HFA) 108 (90 BASE) MCG/ACT inhaler Inhale 2 puffs into the lungs every 6 (six) hours as needed. 1 Inhaler 2  . clorazepate (TRANXENE) 7.5 MG tablet Take 7.5 mg by mouth 3 (three) times daily.    [name] Calcium (STOOL SOFTENER PO) Take 1 tablet by mouth as needed.      Marland Kitchen EPINEPHrine (EPIPEN) 0.3 mg/0.3 mL DEVI Inject 0.3 mLs (0.3 mg total) into the muscle once. 1 Device 1  . FIBER SELECT GUMMIES PO Take by mouth.    . fluticasone (FLONASE) 50 MCG/ACT nasal spray Place 2 sprays into the nose daily. 16 g 6  . loratadine (CLARITIN) 10 MG tablet TAKE ONE (1) TABLET BY MOUTH EVERY DAY 90 tablet 3  . Multiple Vitamin (MULTIVITAMIN) tablet Take 1 tablet by mouth daily.    Marland Kitchen nystatin (MYCOSTATIN) powder Apply topically 2 (two)  times daily. 30 g 0  . omeprazole (PRILOSEC) 20 MG capsule Take 1 capsule (20 mg total) by mouth daily. 90 capsule 0  . propranolol (INDERAL) 20 MG tablet Take 1 tablet (20 mg total) by mouth 3 (three) times daily. 90 tablet 3  . sertraline (ZOLOFT) 100 MG tablet TAKE 1+1/2 TABLETS BY MOUTH DAILY AS DIRECTED 45 tablet 3  . Spacer/Aero Chamber Mouthpiece MISC 1 puff by Does not apply route as needed. 1 each 1  . vitamin C (ASCORBIC ACID) 500 MG tablet Take 1,000 mg by mouth daily.      No current facility-administered medications for this visit.    Allergies: Aciphex; Antivert; Buspar; Chlorpheniramine-pseudoeph; Codeine; Cymbalta; Effexor xr;  Epinephrine; Erythromycin; Escitalopram oxalate; Lamictal; Levofloxacin; Lipitor; Mometasone furoate; Mometasone furoate; Neurontin; Prednisone; Sudafed; Sulfa antibiotics; Valium; Wellbutrin; and Xanax xr    PHYSICAL EXAM: VS:  BP 106/70 mmHg  Pulse 61  Ht 5\' 2"  (1.575 m)  Wt 217 lb 12.8 oz (98.793 kg)  BMI 39.83 kg/m2  SpO2 98%  LMP 08/08/2015 , Body mass index is 39.83 kg/(m^2). Wt Readings from Last 3 Encounters:  08/26/15 217 lb 12.8 oz (98.793 kg)  08/09/15 218 lb 2 oz (98.941 kg)  05/28/15 217 lb 2 oz (98.487 kg)    GENERAL:  well developed, well nourished, obese, not in acute distress HEENT: normocephalic, pink conjunctivae, anicteric sclerae, no xanthelasma, normal dentition, oropharynx clear NECK:  no neck vein engorgement, JVP normal, no hepatojugular reflux, carotid upstroke brisk and symmetric, no bruit, no thyromegaly, no lymphadenopathy LUNGS:  good respiratory effort, clear to auscultation bilaterally CV:  PMI not displaced, no thrills, no lifts, S1 and S2 within normal limits, no palpable S3 or S4, no murmurs, no rubs, no gallops ABD:  Soft, nontender, nondistended, normoactive bowel sounds, no abdominal aortic bruit, no hepatomegaly, no splenomegaly MS: nontender back, no kyphosis, no scoliosis, no joint deformities EXT:  2+ DP/PT pulses, no edema, no varicosities, no cyanosis, no clubbing SKIN: warm, nondiaphoretic, normal turgor, no ulcers NEUROPSYCH: alert, oriented to person, place, and time, sensory/motor grossly intact, normal mood, appropriate affect  Recent Labs: 10/02/2014: Hemoglobin 14.7; Platelets 187 05/28/2015: TSH 1.55 08/09/2015: ALT 24; BUN 12; Creat 0.73; Potassium 4.5; Sodium 141   Lipid Panel    Component Value Date/Time   CHOL 232* 08/09/2015 1609   TRIG 205* 08/09/2015 1609   HDL 31* 08/09/2015 1609   CHOLHDL 7.5* 08/09/2015 1609   VLDL 41* 08/09/2015 1609   LDLCALC 160* 08/09/2015 1609   LDLDIRECT 166.0 05/28/2015 1402     Other  studies Reviewed:  EKG:  The ekg from 08/09/2015 was personally reviewed by me and it revealed sinus rhythm, 82 BPM.  Additional studies/ records that were reviewed personally reviewed by me today include: None available  ASSESSMENT AND PLAN:  Chest pain and shortness of breath with exertion Risk factors for CAD include family history, history of previous smoking, obesity, hyperlipidemia. Recommended to patient to hold off on any exercise for now. Recommend nuclear exercise stress test, and echocardiogram. Patient to call 911 for unrelenting severe chest pain.  Obesity Body mass index is 39.83 kg/(m^2).Marland Kitchen Recommend aggressive weight loss through diet and increased physical activity, Once cardiac workup is done.  Hyperlipidemia Patient would like to try lifestyle changes first. She did not tolerate atorvastatin due to significant weight gain.    Current medicines are reviewed at length with the patient today.  The patient does not have concerns regarding medicines.  Labs/ tests ordered today  include:  Orders Placed This Encounter  Procedures  . NM Myocar Multi W/Spect W/Wall Motion / EF  . Echocardiogram    I had a lengthy and detailed discussion with the patient regarding diagnoses, prognosis, diagnostic options, treatment options.   I counseled the patient on importance of lifestyle modification including heart healthy diet, regular physical activity.   Disposition:   FU with undersigned after tests    Signed, Wende Bushy, MD  08/26/2015 4:02 PM    [facility]

## 2015-08-26 NOTE — Patient Instructions (Addendum)
Medication Instructions:  Your physician recommends that you continue on your current medications as directed. Please refer to the Current Medication list given to you today.   Labwork: None ordered  Testing/Procedures: Your physician has requested that you have an echocardiogram. Echocardiography is a painless test that uses sound waves to create images of your heart. It provides your doctor with information about the size and shape of your heart and how well your heart's chambers and valves are working. This procedure takes approximately one hour. There are no restrictions for this procedure.  Date & Time:________________________________________________________________________  [place]  Your caregiver has ordered a Stress Test with nuclear imaging. The purpose of this test is to evaluate the blood supply to your heart muscle. This procedure is referred to as a "Non-Invasive Stress Test." This is because other than having an IV started in your vein, nothing is inserted or "invades" your body. Cardiac stress tests are done to find areas of poor blood flow to the heart by determining the extent of coronary artery disease (CAD). Some patients exercise on a treadmill, which naturally increases the blood flow to your heart, while others who are  unable to walk on a treadmill due to physical limitations have a pharmacologic/chemical stress agent called Lexiscan . This medicine will mimic walking on a treadmill by temporarily increasing your coronary blood flow.   Please note: these test may take anywhere between 2-4 hours to complete  PLEASE REPORT TO [place] AT THE FIRST DESK WILL DIRECT YOU WHERE TO GO  Date of Procedure:_Tuesday September 03, 2015 at 07:30AM_____________  Arrival Time for Procedure:___Arrive at _07:15AM_____________  Instructions regarding medication:   __X__:  Hold PROPRANOLOL the night before procedure and morning of  procedure    PLEASE NOTIFY THE OFFICE AT LEAST 24 HOURS IN ADVANCE IF YOU ARE UNABLE TO KEEP YOUR APPOINTMENT.  [phone] AND  PLEASE NOTIFY NUCLEAR MEDICINE AT [facility] AT LEAST 24 HOURS IN ADVANCE IF YOU ARE UNABLE TO KEEP YOUR APPOINTMENT. [phone]  How to prepare for your Myoview test:   Do not eat or drink after midnight  No caffeine for 24 hours prior to test  No smoking 24 hours prior to test.  Your medication may be taken with water.  If your doctor stopped a medication because of this test, do not take that medication.  Ladies, please do not wear dresses.  Skirts or pants are appropriate. Please wear a short sleeve shirt.  No perfume, cologne or lotion.  Wear comfortable walking shoes. No heels!            Follow-Up: Your physician recommends that you schedule a follow-up appointment after testing to review results.  Date & Time:____________________________________________________________________   Any Other Special Instructions Will Be Listed Below (If Applicable).     If you need a refill on your cardiac medications before your next appointment, please call your pharmacy.  Echocardiogram An echocardiogram, or echocardiography, uses sound waves (ultrasound) to produce an image of your heart. The echocardiogram is simple, painless, obtained within a short period of time, and offers valuable information to your health care provider. The images from an echocardiogram can provide information such as:  Evidence of coronary artery disease (CAD).  Heart size.  Heart muscle function.  Heart valve function.  Aneurysm detection.  Evidence of a past heart attack.  Fluid buildup around the heart.  Heart muscle thickening.  Assess heart valve function. LET [facility] CARE PROVIDER KNOW ABOUT:  Any allergies you have.  All medicines you are taking, including vitamins, herbs, eye drops, creams, and over-the-counter medicines.  Previous problems  you or members of your family have had with the use of anesthetics.  Any blood disorders you have.  Previous surgeries you have had.  Medical conditions you have.  Possibility of pregnancy, if this applies. BEFORE THE PROCEDURE  No special preparation is needed. Eat and drink normally.  PROCEDURE   In order to produce an image of your heart, gel will be applied to your chest and a wand-like tool (transducer) will be moved over your chest. The gel will help transmit the sound waves from the transducer. The sound waves will harmlessly bounce off your heart to allow the heart images to be captured in real-time motion. These images will then be recorded.  You may need an IV to receive a medicine that improves the quality of the pictures. AFTER THE PROCEDURE You may return to your normal schedule including diet, activities, and medicines, unless your health care provider tells you otherwise.   This information is not intended to replace advice given to you by your health care provider. Make sure you discuss any questions you have with your health care provider.   Document Released: 05/01/2000 Document Revised: 05/25/2014 Document Reviewed: 01/09/2013 Elsevier Interactive Patient Education 2016 [employer].   Cardiac Nuclear Scanning A cardiac nuclear scan is used to check your heart for problems, such as the following:  A portion of the heart is not getting enough blood.  Part of the heart muscle has died, which happens with a heart attack.  The heart wall is not working normally.  In this test, a radioactive dye (tracer) is injected into your bloodstream. After the tracer has traveled to your heart, a scanning device is used to measure how much of the tracer is absorbed by or distributed to various areas of your heart. LET [facility] CARE PROVIDER KNOW ABOUT:  Any allergies you have.  All medicines you are taking, including vitamins, herbs, eye drops, creams, and  over-the-counter medicines.  Previous problems you or members of your family have had with the use of anesthetics.  Any blood disorders you have.  Previous surgeries you have had.  Medical conditions you have.  RISKS AND COMPLICATIONS Generally, this is a safe procedure. However, as with any procedure, problems can occur. Possible problems include:   Serious chest pain.  Rapid heartbeat.  Sensation of warmth in your chest. This usually passes quickly. BEFORE THE PROCEDURE Ask your health care provider about changing or stopping your regular medicines. PROCEDURE This procedure is usually done at a hospital and takes 2-4 hours.  An IV tube is inserted into one of your veins.  Your health care provider will inject a small amount of radioactive tracer through the tube.  You will then wait for 20-40 minutes while the tracer travels through your bloodstream.  You will lie down on an exam table so images of your heart can be taken. Images will be taken for about 15-20 minutes.  You will exercise on a treadmill or stationary bike. While you exercise, your heart activity will be monitored with an electrocardiogram (ECG), and your blood pressure will be checked.  If you are unable to exercise, you may be given a medicine to make your heart beat faster.  When blood flow to your heart has peaked, tracer will again be injected through the IV tube.  After 20-40 minutes, you will get back on  the exam table and have more images taken of your heart.  When the procedure is over, your IV tube will be removed. AFTER THE PROCEDURE  You will likely be able to leave shortly after the test. Unless your health care provider tells you otherwise, you may return to your normal schedule, including diet, activities, and medicines.  Make sure you find out how and when you will get your test results.   This information is not intended to replace advice given to you by your health care provider. Make  sure you discuss any questions you have with your health care provider.   Document Released: 05/29/2004 Document Revised: 05/09/2013 Document Reviewed: 04/12/2013 Elsevier Interactive Patient Education [employer].

## 2015-09-02 ENCOUNTER — Telehealth: Payer: Self-pay | Admitting: Cardiology

## 2015-09-02 NOTE — Telephone Encounter (Signed)
Left detailed voicemail message on phone regarding stress test scheduled for tomorrow with time, instructions, and return number to call if any further questions.

## 2015-09-03 ENCOUNTER — Ambulatory Visit
Admission: RE | Admit: 2015-09-03 | Discharge: 2015-09-03 | Disposition: A | Discharge status: Home / Self Care | Payer: Medicare Other | Source: Ambulatory Visit | Attending: Cardiology | Admitting: Cardiology

## 2015-09-03 DIAGNOSIS — R0600 Dyspnea, unspecified: Secondary | ICD-10-CM | POA: Insufficient documentation

## 2015-09-03 DIAGNOSIS — R079 Chest pain, unspecified: Secondary | ICD-10-CM | POA: Diagnosis not present

## 2015-09-03 HISTORY — DX: Malignant (primary) neoplasm, unspecified: C80.1

## 2015-09-03 HISTORY — DX: Unspecified asthma, uncomplicated: J45.909

## 2015-09-03 HISTORY — DX: Essential (primary) hypertension: I10

## 2015-09-03 LAB — NM MYOCAR MULTI W/SPECT W/WALL MOTION / EF
CHL CUP NUCLEAR SSS: 9
CHL CUP STRESS STAGE 1 GRADE: 0 %
CHL CUP STRESS STAGE 1 SPEED: 0 mph
CHL CUP STRESS STAGE 2 GRADE: 0 %
CHL CUP STRESS STAGE 2 HR: 71 {beats}/min
CHL CUP STRESS STAGE 4 SPEED: 2.5 mph
CHL CUP STRESS STAGE 5 GRADE: 14 %
CHL CUP STRESS STAGE 5 SPEED: 3.4 mph
CHL CUP STRESS STAGE 6 DBP: 72 mmHg
CHL CUP STRESS STAGE 6 GRADE: 0 %
CHL CUP STRESS STAGE 6 HR: 130 {beats}/min
CHL CUP STRESS STAGE 7 HR: 92 {beats}/min
CSEPED: 8 min
CSEPEW: 10.1 METS
CSEPPHR: 164 {beats}/min
LV dias vol: 80 mL (ref 46–106)
LV sys vol: 26 mL
NUC STRESS TID: 0.87
Percent of predicted max HR: 95 %
Rest HR: 61 {beats}/min
SDS: 4
SRS: 5
Stage 1 HR: 71 {beats}/min
Stage 2 Speed: 0 mph
Stage 3 DBP: 70 mmHg
Stage 3 Grade: 10 %
Stage 3 HR: 111 {beats}/min
Stage 3 SBP: 112 mmHg
Stage 3 Speed: 1.7 mph
Stage 4 DBP: 68 mmHg
Stage 4 Grade: 12 %
Stage 4 HR: 129 {beats}/min
Stage 4 SBP: 132 mmHg
Stage 5 HR: 164 {beats}/min
Stage 6 SBP: 128 mmHg
Stage 6 Speed: 0 mph
Stage 7 Grade: 0 %
Stage 7 Speed: 0 mph

## 2015-09-03 MED ORDER — TECHNETIUM TC 99M SESTAMIBI - CARDIOLITE
13.3900 | Freq: Once | INTRAVENOUS | Status: AC | PRN
  Administered 2015-09-03: 13.39 via INTRAVENOUS

## 2015-09-03 MED ORDER — TECHNETIUM TC 99M SESTAMIBI - CARDIOLITE
32.5600 | Freq: Once | INTRAVENOUS | Status: AC | PRN
  Administered 2015-09-03: 32.56 via INTRAVENOUS

## 2015-09-06 ENCOUNTER — Other Ambulatory Visit: Payer: Self-pay

## 2015-09-06 ENCOUNTER — Ambulatory Visit (INDEPENDENT_AMBULATORY_CARE_PROVIDER_SITE_OTHER): Payer: Medicare Other

## 2015-09-06 DIAGNOSIS — R0600 Dyspnea, unspecified: Secondary | ICD-10-CM

## 2015-09-06 DIAGNOSIS — R079 Chest pain, unspecified: Secondary | ICD-10-CM | POA: Diagnosis not present

## 2015-09-17 ENCOUNTER — Ambulatory Visit (INDEPENDENT_AMBULATORY_CARE_PROVIDER_SITE_OTHER): Payer: Medicare Other | Admitting: Cardiology

## 2015-09-17 ENCOUNTER — Encounter: Payer: Self-pay | Admitting: Cardiology

## 2015-09-17 VITALS — BP 98/70 | HR 74 | Ht 62.0 in | Wt 210.0 lb

## 2015-09-17 DIAGNOSIS — I340 Nonrheumatic mitral (valve) insufficiency: Secondary | ICD-10-CM | POA: Insufficient documentation

## 2015-09-17 DIAGNOSIS — E785 Hyperlipidemia, unspecified: Secondary | ICD-10-CM | POA: Diagnosis not present

## 2015-09-17 DIAGNOSIS — R079 Chest pain, unspecified: Secondary | ICD-10-CM | POA: Diagnosis not present

## 2015-09-17 NOTE — Progress Notes (Signed)
Cardiology Office Note   Date:  09/17/2015   ID:  Brittney Ruiz, DOB [birth year], MRN [id number]  Referring Doctor:  Brittney Mast, MD   Cardiologist:   Brittney Bushy, MD   Reason for consultation:  Chief Complaint  Patient presents with  . other    F/u echo and stress test. Meds reviewed verbally with pt.      History of Present Illness: Brittney Ruiz is a 50 y.o. female who presents for chest pain and shortness of breath.  Patient has had no recurrence of chest pain or short of breath.  Patient denies palpitations, loss of consciousness, orthopnea, PND, edema. No headache, fever, cough, colds, abdominal pain.   ROS:  Please see the history of present illness. Aside from mentioned under HPI, all other systems are reviewed and negative.     Past Medical History  Diagnosis Date  . Depression   . Allergy   . Anxiety   . Obesity (BMI 30-39.9)   . Vaginal delivery     X 2  . Hypertension   . Asthma   . Cancer [facility])     melanoma 2003    Past Surgical History  Procedure Laterality Date  . Skin cancer excision      melanoma  . Leep       reports that she quit smoking about 7 years ago. She has never used smokeless tobacco. She reports that she does not drink alcohol or use illicit drugs.   family history includes Cancer in her father, maternal grandmother, and paternal grandfather; Depression in her father and maternal grandfather; Diabetes in her mother; Heart disease in her maternal grandfather; Hyperlipidemia in her brother, mother, and sister; Hypertension in her brother, mother, and sister; Migraines in her brother and sister. There is no history of Breast cancer.  Grandfather had CAD in his 47s. Paternal uncle had CAD in his 45s.  Current Outpatient Prescriptions  Medication Sig Dispense Refill  . ACETAMINOPHEN PO Take by mouth as needed.      Marland Kitchen albuterol (PROVENTIL HFA;VENTOLIN HFA) 108 (90 BASE) MCG/ACT inhaler Inhale 2 puffs into the lungs every 6  (six) hours as needed. 1 Inhaler 2  . clorazepate (TRANXENE) 7.5 MG tablet Take 7.5 mg by mouth 3 (three) times daily.    Brittney Ruiz Calcium (STOOL SOFTENER PO) Take 1 tablet by mouth as needed.      Marland Kitchen EPINEPHrine (EPIPEN) 0.3 mg/0.3 mL DEVI Inject 0.3 mLs (0.3 mg total) into the muscle once. 1 Device 1  . FIBER SELECT GUMMIES PO Take by mouth.    . fluticasone (FLONASE) 50 MCG/ACT nasal spray Place 2 sprays into the nose daily. 16 g 6  . loratadine (CLARITIN) 10 MG tablet TAKE ONE (1) TABLET BY MOUTH EVERY DAY 90 tablet 3  . Multiple Vitamin (MULTIVITAMIN) tablet Take 1 tablet by mouth daily.    Marland Kitchen nystatin (MYCOSTATIN) powder Apply topically 2 (two) times daily. 30 g 0  . omeprazole (PRILOSEC) 20 MG capsule Take 1 capsule (20 mg total) by mouth daily. 90 capsule 0  . propranolol (INDERAL) 20 MG tablet Take 1 tablet (20 mg total) by mouth 3 (three) times daily. 90 tablet 3  . sertraline (ZOLOFT) 100 MG tablet TAKE 1+1/2 TABLETS BY MOUTH DAILY AS DIRECTED 45 tablet 3  . Spacer/Aero Chamber Mouthpiece MISC 1 puff by Does not apply route as needed. 1 each 1  . vitamin C (ASCORBIC ACID) 500 MG tablet Take 1,000 mg by mouth  daily.      No current facility-administered medications for this visit.    Allergies: Aciphex; Antivert; Buspar; Chlorpheniramine-pseudoeph; Codeine; Cymbalta; Effexor xr; Epinephrine; Erythromycin; Escitalopram oxalate; Lamictal; Levofloxacin; Lipitor; Mometasone furoate; Mometasone furoate; Neurontin; Prednisone; Sudafed; Sulfa antibiotics; Valium; Wellbutrin; and Xanax xr    PHYSICAL EXAM: VS:  BP 98/70 mmHg  Pulse 74  Ht 5\' 2"  (1.575 m)  Wt 210 lb (95.255 kg)  BMI 38.40 kg/m2  LMP 08/08/2015 (Exact Date) , Body mass index is 38.4 kg/(m^2). Wt Readings from Last 3 Encounters:  09/17/15 210 lb (95.255 kg)  08/26/15 217 lb 12.8 oz (98.793 kg)  08/09/15 218 lb 2 oz (98.941 kg)    GENERAL:  well developed, well nourished, obese, not in acute distress HEENT:  normocephalic, pink conjunctivae, anicteric sclerae, no xanthelasma, normal dentition, oropharynx clear NECK:  no neck vein engorgement, JVP normal, no hepatojugular reflux, carotid upstroke brisk and symmetric, no bruit, no thyromegaly, no lymphadenopathy LUNGS:  good respiratory effort, clear to auscultation bilaterally CV:  PMI not displaced, no thrills, no lifts, S1 and S2 within normal limits, no palpable S3 or S4, no murmurs, no rubs, no gallops ABD:  Soft, nontender, nondistended, normoactive bowel sounds, no abdominal aortic bruit, no hepatomegaly, no splenomegaly MS: nontender back, no kyphosis, no scoliosis, no joint deformities EXT:  2+ DP/PT pulses, no edema, no varicosities, no cyanosis, no clubbing SKIN: warm, nondiaphoretic, normal turgor, no ulcers NEUROPSYCH: alert, oriented to person, place, and time, sensory/motor grossly intact, normal mood, appropriate affect  Recent Labs: 10/02/2014: Hemoglobin 14.7; Platelets 187 05/28/2015: TSH 1.55 08/09/2015: ALT 24; BUN 12; Creat 0.73; Potassium 4.5; Sodium 141   Lipid Panel    Component Value Date/Time   CHOL 232* 08/09/2015 1609   TRIG 205* 08/09/2015 1609   HDL 31* 08/09/2015 1609   CHOLHDL 7.5* 08/09/2015 1609   VLDL 41* 08/09/2015 1609   LDLCALC 160* 08/09/2015 1609   LDLDIRECT 166.0 05/28/2015 1402     Other studies Reviewed:  EKG:  The ekg from 08/09/2015 was personally reviewed by me and it revealed sinus rhythm, 82 BPM.  Additional studies/ records that were reviewed personally reviewed by me today include:  Echocardiogram 09/06/2015: Left ventricle: The cavity size was normal. Wall thickness was  normal. Systolic function was normal. The estimated ejection  fraction was in the range of 60% to 65%. Wall motion was normal;  there were no regional wall motion abnormalities. Left  ventricular diastolic function parameters were normal. - Mitral valve: There was mild malcoaptation of the valve leaflets.  There  was mild to moderate regurgitation. - Left atrium: The atrium was mildly dilated. - Pulmonary arteries: Systolic pressure was mildly increased. PA  peak pressure: 35 mm Hg (S).  Exercise nuclear stress is 09/03/2015: Exercise myocardial perfusion imaging study with no significant ischemia Normal wall motion, EF estimated at 53% No EKG changes concerning for ischemia at peak stress or in recovery. Low risk scan   ASSESSMENT AND PLAN:  Chest pain and shortness of breath with exertion Risk factors for CAD include family history, history of previous smoking, obesity, hyperlipidemia. Exercise nuclear stress was negative for ischemia. This pertains a low risk for clinically significant CAD. Patient reassured.  Mitral regurgitation Mild to moderate MR. No murmur appreciated. Recommend echo in 1 year in follow-up.  Obesity Body mass index is 38.4 kg/(m^2).Marland Kitchen Recommend aggressive weight loss through diet and increased physical activity, Once cardiac workup is done.  Hyperlipidemia Patient would like to try lifestyle changes first.  She did not tolerate atorvastatin due to significant weight gain.    Current medicines are reviewed at length with the patient today.  The patient does not have concerns regarding medicines.  Labs/ tests ordered today include:  No orders of the defined types were placed in this encounter.    I had a lengthy and detailed discussion with the patient regarding diagnoses, prognosis, diagnostic options, treatment options.   I counseled the patient on importance of lifestyle modification including heart healthy diet, regular physical activity.   Disposition:   FU with undersigned in one year   Signed, Brittney Bushy, MD  09/17/2015 3:47 PM    [place]

## 2015-09-17 NOTE — Patient Instructions (Addendum)
Medication Instructions:  Your physician recommends that you continue on your current medications as directed. Please refer to the Current Medication list given to you today.   Labwork: None ordered  Testing/Procedures: Your physician has requested that you have an echocardiogram. Echocardiography is a painless test that uses sound waves to create images of your heart. It provides your doctor with information about the size and shape of your heart and how well your heart's chambers and valves are working. This procedure takes approximately one hour. There are no restrictions for this procedure.  In 1 year.   Follow-Up: Your physician wants you to follow-up in: 1 year with Dr. Yvone Neu. You will receive a reminder letter in the mail two months in advance. If you don't receive a letter, please call our office to schedule the follow-up appointment.   Any Other Special Instructions Will Be Listed Below (If Applicable).     If you need a refill on your cardiac medications before your next appointment, please call your pharmacy.  Echocardiogram An echocardiogram, or echocardiography, uses sound waves (ultrasound) to produce an image of your heart. The echocardiogram is simple, painless, obtained within a short period of time, and offers valuable information to your health care provider. The images from an echocardiogram can provide information such as:  Evidence of coronary artery disease (CAD).  Heart size.  Heart muscle function.  Heart valve function.  Aneurysm detection.  Evidence of a past heart attack.  Fluid buildup around the heart.  Heart muscle thickening.  Assess heart valve function. LET [facility] CARE PROVIDER KNOW ABOUT:  Any allergies you have.  All medicines you are taking, including vitamins, herbs, eye drops, creams, and over-the-counter medicines.  Previous problems you or members of your family have had with the use of anesthetics.  Any blood disorders  you have.  Previous surgeries you have had.  Medical conditions you have.  Possibility of pregnancy, if this applies. BEFORE THE PROCEDURE  No special preparation is needed. Eat and drink normally.  PROCEDURE   In order to produce an image of your heart, gel will be applied to your chest and a wand-like tool (transducer) will be moved over your chest. The gel will help transmit the sound waves from the transducer. The sound waves will harmlessly bounce off your heart to allow the heart images to be captured in real-time motion. These images will then be recorded.  You may need an IV to receive a medicine that improves the quality of the pictures. AFTER THE PROCEDURE You may return to your normal schedule including diet, activities, and medicines, unless your health care provider tells you otherwise.   This information is not intended to replace advice given to you by your health care provider. Make sure you discuss any questions you have with your health care provider.   Document Released: 05/01/2000 Document Revised: 05/25/2014 Document Reviewed: 01/09/2013 Elsevier Interactive Patient Education [employer].

## 2015-09-20 ENCOUNTER — Ambulatory Visit: Payer: Medicare Other | Admitting: Internal Medicine

## 2015-09-24 ENCOUNTER — Other Ambulatory Visit: Payer: Self-pay | Admitting: Internal Medicine

## 2015-10-02 ENCOUNTER — Other Ambulatory Visit: Payer: Self-pay

## 2015-10-02 MED ORDER — FLUTICASONE PROPIONATE 50 MCG/ACT NA SUSP: 2.0000 | Freq: Every day | NASAL | Status: DC

## 2015-10-02 NOTE — Telephone Encounter (Signed)
Please advise, this was last filled in 2014.  Thanks

## 2015-10-30 ENCOUNTER — Telehealth: Payer: Self-pay | Admitting: Internal Medicine

## 2015-10-30 NOTE — Telephone Encounter (Signed)
Appointment was declined by patient.  She is going to call ortho to see if she can get an appointment without a referral.  She will call back if an appointment is needed.

## 2015-10-30 NOTE — Telephone Encounter (Signed)
Needs to be seen first. Any provider fine.

## 2015-10-30 NOTE — Telephone Encounter (Signed)
Pt called about a pulled muscle 7 days ago. Pt states she lifted up to reach for laundry detergent. Pt also states she has tried everything to help it. Pt says it is painful when she's up but when she lies on her back it eases the pain. The pain is across the center of her back. She used pain medication,heat,ice and tens unit. Pt would like to be referred to a orthopedic. Pt did not want to come in she wants to be referred.  Call pt @ [phone]. Thank you!

## 2015-10-31 DIAGNOSIS — M5441 Lumbago with sciatica, right side: Secondary | ICD-10-CM | POA: Diagnosis not present

## 2015-10-31 DIAGNOSIS — M5442 Lumbago with sciatica, left side: Secondary | ICD-10-CM | POA: Diagnosis not present

## 2015-10-31 DIAGNOSIS — M6281 Muscle weakness (generalized): Secondary | ICD-10-CM | POA: Diagnosis not present

## 2015-10-31 DIAGNOSIS — M4046 Postural lordosis, lumbar region: Secondary | ICD-10-CM | POA: Diagnosis not present

## 2015-11-28 ENCOUNTER — Other Ambulatory Visit: Payer: Self-pay | Admitting: Internal Medicine

## 2015-12-28 DIAGNOSIS — J020 Streptococcal pharyngitis: Secondary | ICD-10-CM | POA: Diagnosis not present

## 2016-01-24 DIAGNOSIS — F419 Anxiety disorder, unspecified: Secondary | ICD-10-CM | POA: Diagnosis not present

## 2016-01-24 DIAGNOSIS — F324 Major depressive disorder, single episode, in partial remission: Secondary | ICD-10-CM | POA: Diagnosis not present

## 2016-02-05 ENCOUNTER — Other Ambulatory Visit: Payer: Self-pay

## 2016-02-05 MED ORDER — OMEPRAZOLE 20 MG PO CPDR
20.0000 mg | DELAYED_RELEASE_CAPSULE | Freq: Every day | ORAL | 0 refills | Status: DC
Start: 2016-02-05 — End: 2016-04-08

## 2016-02-21 DIAGNOSIS — Z23 Encounter for immunization: Secondary | ICD-10-CM | POA: Diagnosis not present

## 2016-03-16 ENCOUNTER — Encounter: Payer: Self-pay | Admitting: Family

## 2016-03-16 ENCOUNTER — Ambulatory Visit (INDEPENDENT_AMBULATORY_CARE_PROVIDER_SITE_OTHER): Payer: Medicare Other | Admitting: Family

## 2016-03-16 VITALS — BP 112/78 | HR 80 | Temp 98.1°F | Ht 62.0 in | Wt 212.0 lb

## 2016-03-16 DIAGNOSIS — J0120 Acute ethmoidal sinusitis, unspecified: Secondary | ICD-10-CM

## 2016-03-16 DIAGNOSIS — J209 Acute bronchitis, unspecified: Secondary | ICD-10-CM

## 2016-03-16 MED ORDER — AMOXICILLIN 500 MG PO CAPS: 500.0000 mg | ORAL_CAPSULE | Freq: Two times a day (BID) | ORAL | 0 refills | Status: DC

## 2016-03-16 MED ORDER — ALBUTEROL SULFATE HFA 108 (90 BASE) MCG/ACT IN AERS: 2.0000 | INHALATION_SPRAY | Freq: Four times a day (QID) | RESPIRATORY_TRACT | 1 refills | Status: DC | PRN

## 2016-03-16 NOTE — Progress Notes (Signed)
Subjective:    Patient ID: Brittney Ruiz, female    DOB: [birth year], 50 y.o.   MRN: [id number]  CC: Brittney Ruiz is a 50 y.o. female who presents today for an acute visit.    HPI: Patient here for acute visit with chief complaint productive cough, congestion for week and half, unchanged. Endorses sore throat, sinus congestion, fatigue.  No fever, chills. H/o melanoma. Tried dimitap, motrin with some relief.    H/o asthma and uses PRN albuterol. Has seasonal allergies.   Patient is unsure with whom she will establish care with. I advised her to ensure that she makes establish care, follow-up appointment.     HISTORY:  Past Medical History:  Diagnosis Date  . Allergy   . Anxiety   . Asthma   . Cancer ([place])    melanoma 2003  . Depression   . Hypertension   . Obesity (BMI 30-39.9)   . Vaginal delivery    X 2   Past Surgical History:  Procedure Laterality Date  . LEEP  1995  . SKIN CANCER EXCISION     melanoma   Family History  Problem Relation Age of Onset  . Hyperlipidemia Mother   . Hypertension Mother   . Diabetes Mother   . Cancer Father     lung  . Depression Father   . Hyperlipidemia Sister   . Hypertension Sister   . Migraines Sister   . Hypertension Brother   . Hyperlipidemia Brother   . Migraines Brother   . Cancer Maternal Grandmother     brain  . Depression Maternal Grandfather   . Heart disease Maternal Grandfather   . Cancer Paternal Grandfather     unknown  . Breast cancer Neg Hx     Allergies: Aciphex [rabeprazole sodium]; Antivert [meclizine hcl]; Buspar [buspirone hcl]; Chlorpheniramine-pseudoeph; Codeine; Cymbalta [duloxetine hcl]; Effexor xr [venlafaxine hydrochloride]; Epinephrine; Erythromycin; Escitalopram oxalate; Lamictal [lamotrigine]; Levofloxacin; Lipitor [atorvastatin]; Mometasone furoate; Mometasone furoate; Neurontin [gabapentin]; Prednisone; Sudafed [pseudoephedrine hcl]; Sulfa antibiotics; Valium; Wellbutrin [bupropion hcl]; and  Xanax xr [alprazolam] Current Outpatient Prescriptions on File Prior to Visit  Medication Sig Dispense Refill  . ACETAMINOPHEN PO Take by mouth as needed.      . clorazepate (TRANXENE) 7.5 MG tablet Take 7.5 mg by mouth 3 (three) times daily.    [name] Calcium (STOOL SOFTENER PO) Take 1 tablet by mouth as needed.      Marland Kitchen EPINEPHrine (EPIPEN) 0.3 mg/0.3 mL DEVI Inject 0.3 mLs (0.3 mg total) into the muscle once. 1 Device 1  . FIBER SELECT GUMMIES PO Take by mouth.    . fluticasone (FLONASE) 50 MCG/ACT nasal spray Place 2 sprays into both nostrils daily. 16 g 6  . loratadine (CLARITIN) 10 MG tablet TAKE ONE (1) TABLET BY MOUTH EVERY DAY 90 tablet 3  . Multiple Vitamin (MULTIVITAMIN) tablet Take 1 tablet by mouth daily.    Marland Kitchen nystatin (MYCOSTATIN) powder Apply topically 2 (two) times daily. 30 g 0  . omeprazole (PRILOSEC) 20 MG capsule Take 1 capsule (20 mg total) by mouth daily. 90 capsule 0  . propranolol (INDERAL) 20 MG tablet Take 1 tablet (20 mg total) by mouth 3 (three) times daily. 90 tablet 3  . sertraline (ZOLOFT) 100 MG tablet TAKE 1+1/2 TABLETS BY MOUTH DAILY AS DIRECTED 45 tablet 3  . Spacer/Aero Chamber Mouthpiece MISC 1 puff by Does not apply route as needed. 1 each 1  . vitamin C (ASCORBIC ACID) 500 MG tablet Take 1,000  mg by mouth daily.      No current facility-administered medications on file prior to visit.     Social History  Substance Use Topics  . Smoking status: Former Smoker    Quit date: 03/09/2008  . Smokeless tobacco: Never Used  . Alcohol use No    Review of Systems  Constitutional: Negative for chills and fever.  HENT: Positive for congestion and sore throat.   Respiratory: Positive for cough.   Cardiovascular: Negative for chest pain and palpitations.  Gastrointestinal: Negative for nausea and vomiting.      Objective:    BP 112/78   Pulse 80   Temp 98.1 F (36.7 C) (Oral)   Ht 5\' 2"  (1.575 m)   Wt 212 lb (96.2 kg)   SpO2 97%   BMI 38.78  kg/m    Physical Exam  Constitutional: She appears well-developed and well-nourished.  HENT:  Head: Normocephalic and atraumatic.  Right Ear: Hearing, tympanic membrane, external ear and ear canal normal. No drainage, swelling or tenderness. No foreign bodies. Tympanic membrane is not erythematous and not bulging. No middle ear effusion. No decreased hearing is noted.  Left Ear: Hearing, tympanic membrane, external ear and ear canal normal. No drainage, swelling or tenderness. No foreign bodies. Tympanic membrane is not erythematous and not bulging.  No middle ear effusion. No decreased hearing is noted.  Nose: Nose normal. No rhinorrhea. Right sinus exhibits no maxillary sinus tenderness and no frontal sinus tenderness. Left sinus exhibits no maxillary sinus tenderness and no frontal sinus tenderness.  Mouth/Throat: Uvula is midline, oropharynx is clear and moist and mucous membranes are normal. No oropharyngeal exudate, posterior oropharyngeal edema, posterior oropharyngeal erythema or tonsillar abscesses.  Eyes: Conjunctivae are normal.  Cardiovascular: Regular rhythm, normal heart sounds and normal pulses.   Pulmonary/Chest: Effort normal and breath sounds normal. She has no wheezes. She has no rhonchi. She has no rales.  Lymphadenopathy:       Head (right side): No submental, no submandibular, no tonsillar, no preauricular, no posterior auricular and no occipital adenopathy present.       Head (left side): No submental, no submandibular, no tonsillar, no preauricular, no posterior auricular and no occipital adenopathy present.    She has no cervical adenopathy.  Neurological: She is alert.  Skin: Skin is warm and dry.  Psychiatric: She has a normal mood and affect. Her speech is normal and behavior is normal. Thought content normal.  Vitals reviewed.      Assessment & Plan:   1. Acute non-recurrent ethmoidal sinusitis Afebrile. Due to duration of symptoms and concern due to h/o  cancer, patient and I agreed to start antibiotic therapy after one-two more days of symptom management. Return precautions given.  - amoxicillin (AMOXIL) 500 MG capsule; Take 1 capsule (500 mg total) by mouth 2 (two) times daily.  Dispense: 14 capsule; Refill: 0  2. Acute bronchitis, unspecified organism No adventitious lung sounds. Working diagnosis of viral bronchitis. Patient has a history of asthma. Refilled albuterol. - albuterol (PROVENTIL HFA;VENTOLIN HFA) 108 (90 Base) MCG/ACT inhaler; Inhale 2 puffs into the lungs every 6 (six) hours as needed.  Dispense: 1 Inhaler; Refill: 1    I am having Ms. Russett start on amoxicillin. I am also having her maintain her Docusate Calcium (STOOL SOFTENER PO), Spacer/Aero Chamber Mouthpiece, ACETAMINOPHEN PO, EPINEPHrine, clorazepate, vitamin C, multivitamin, FIBER SELECT GUMMIES PO, sertraline, nystatin, loratadine, propranolol, fluticasone, omeprazole, and albuterol.   Meds ordered this encounter  Medications  .  amoxicillin (AMOXIL) 500 MG capsule    Sig: Take 1 capsule (500 mg total) by mouth 2 (two) times daily.    Dispense:  14 capsule    Refill:  0    Order Specific Question:   Supervising Provider    Answer:   Deborra Medina L [[id number]]  . albuterol (PROVENTIL HFA;VENTOLIN HFA) 108 (90 Base) MCG/ACT inhaler    Sig: Inhale 2 puffs into the lungs every 6 (six) hours as needed.    Dispense:  1 Inhaler    Refill:  1    Order Specific Question:   Supervising Provider    Answer:   Crecencio Mc [[id number]]    Return precautions given.   Risks, benefits, and alternatives of the medications and treatment plan prescribed today were discussed, and patient expressed understanding.   Education regarding symptom management and diagnosis given to patient on AVS.  Continue to follow with Mable Paris, FNP for routine health maintenance.   Aggie Hacker and I agreed with plan.   Mable Paris, FNP

## 2016-03-16 NOTE — Patient Instructions (Signed)
I suspect that your infection is viral in nature.  As discussed, I advise that you wait to fill the antibiotic after 1-2 days of symptom management to see if your symptoms improve. If you do not show improvement, you may take the antibiotic as prescribed.  Increase intake of clear fluids. Congestion is best treated by hydration, when mucus is wetter, it is thinner, less sticky, and easier to expel from the body, either through coughing up drainage, or by blowing your nose.   Get plenty of rest.   Use saline nasal drops and blow your nose frequently. Run a humidifier at night and elevate the head of the bed. Vicks Vapor rub will help with congestion and cough. Steam showers and sinus massage for congestion.   Use Acetaminophen or Ibuprofen as needed for fever or pain. Avoid second hand smoke. Even the smallest exposure will worsen symptoms.   Over the counter medications you can try include Delsym for cough, a decongestant for congestion, and Mucinex or Robitussin as an expectorant. Be sure to just get the plain Mucinex or Robitussin that just has one medication (Guaifenesen). We don't recommend the combination products. Note, be sure to drink two glasses of water with each dose of Mucinex as the medication will not work well without adequate hydration.   You can also try a teaspoon of honey to see if this will help reduce cough. Throat lozenges can sometimes be beneficial as well.    This illness will typically last 7 - 10 days.   Please follow up with our clinic if you develop a fever greater than 101 F, symptoms worsen, or do not resolve in the next week.     

## 2016-03-16 NOTE — Progress Notes (Signed)
Pre visit review using our clinic review tool, if applicable. No additional management support is needed unless otherwise documented below in the visit note. 

## 2016-03-18 ENCOUNTER — Telehealth: Payer: Self-pay | Admitting: Family

## 2016-03-18 NOTE — Telephone Encounter (Signed)
I called pt to sch pt for a AWV. Pt is scheduled for a CPE. Pt only has Medicare not a supplement insurance. I explained to the pt the difference between a CPE and a Wellness. Pt states that she has been getting a CPE all this time. I also explained to pt that Medicare does not cover CPE only Wellness appts. Pt hung up.

## 2016-04-08 ENCOUNTER — Other Ambulatory Visit: Payer: Self-pay | Admitting: Family Medicine

## 2016-04-15 ENCOUNTER — Telehealth: Payer: Self-pay | Admitting: Family

## 2016-04-15 NOTE — Telephone Encounter (Signed)
Pt has declined the AWV. Than you!

## 2016-04-20 ENCOUNTER — Encounter: Payer: Self-pay | Admitting: Family

## 2016-04-20 ENCOUNTER — Other Ambulatory Visit (HOSPITAL_COMMUNITY)
Admission: RE | Admit: 2016-04-20 | Discharge: 2016-04-20 | Disposition: A | Discharge status: Home / Self Care | Payer: Medicare Other | Source: Ambulatory Visit | Attending: Family | Admitting: Family

## 2016-04-20 ENCOUNTER — Ambulatory Visit (INDEPENDENT_AMBULATORY_CARE_PROVIDER_SITE_OTHER): Payer: Medicare Other | Admitting: Family

## 2016-04-20 VITALS — BP 116/88 | HR 73 | Temp 97.8°F | Ht 62.0 in | Wt 211.6 lb

## 2016-04-20 DIAGNOSIS — F419 Anxiety disorder, unspecified: Secondary | ICD-10-CM

## 2016-04-20 DIAGNOSIS — Z1151 Encounter for screening for human papillomavirus (HPV): Secondary | ICD-10-CM | POA: Diagnosis not present

## 2016-04-20 DIAGNOSIS — Z113 Encounter for screening for infections with a predominantly sexual mode of transmission: Secondary | ICD-10-CM | POA: Diagnosis not present

## 2016-04-20 DIAGNOSIS — N889 Noninflammatory disorder of cervix uteri, unspecified: Secondary | ICD-10-CM | POA: Insufficient documentation

## 2016-04-20 DIAGNOSIS — R8290 Unspecified abnormal findings in urine: Secondary | ICD-10-CM | POA: Diagnosis not present

## 2016-04-20 DIAGNOSIS — Z01419 Encounter for gynecological examination (general) (routine) without abnormal findings: Secondary | ICD-10-CM | POA: Diagnosis not present

## 2016-04-20 DIAGNOSIS — R030 Elevated blood-pressure reading, without diagnosis of hypertension: Secondary | ICD-10-CM | POA: Diagnosis not present

## 2016-04-20 DIAGNOSIS — Z0000 Encounter for general adult medical examination without abnormal findings: Secondary | ICD-10-CM | POA: Diagnosis not present

## 2016-04-20 LAB — COMPREHENSIVE METABOLIC PANEL
ALBUMIN: 4.7 g/dL (ref 3.5–5.2)
ALT: 31 U/L (ref 0–35)
AST: 24 U/L (ref 0–37)
Alkaline Phosphatase: 60 U/L (ref 39–117)
BUN: 14 mg/dL (ref 6–23)
CHLORIDE: 99 meq/L (ref 96–112)
CO2: 29 meq/L (ref 19–32)
CREATININE: 0.69 mg/dL (ref 0.40–1.20)
Calcium: 9.6 mg/dL (ref 8.4–10.5)
GFR: 96.04 mL/min (ref >60.00)
GLUCOSE: 89 mg/dL (ref 70–99)
Potassium: 4.1 mEq/L (ref 3.5–5.1)
SODIUM: 137 meq/L (ref 135–145)
Total Bilirubin: 0.7 mg/dL (ref 0.2–1.2)
Total Protein: 7.1 g/dL (ref 6.0–8.3)

## 2016-04-20 LAB — CBC WITH DIFFERENTIAL/PLATELET
BASOS PCT: 0.3 % (ref 0.0–3.0)
Basophils Absolute: 0 10*3/uL (ref 0.0–0.1)
EOS ABS: 0.2 10*3/uL (ref 0.0–0.7)
Eosinophils Relative: 1.9 % (ref 0.0–5.0)
HCT: 44.5 % (ref 36.0–46.0)
HEMOGLOBIN: 14.9 g/dL (ref 12.0–15.0)
Lymphocytes Relative: 24.7 % (ref 12.0–46.0)
Lymphs Abs: 2.3 10*3/uL (ref 0.7–4.0)
MCHC: 33.4 g/dL (ref 30.0–36.0)
MCV: 86.2 fl (ref 78.0–100.0)
MONO ABS: 0.4 10*3/uL (ref 0.1–1.0)
Monocytes Relative: 4.9 % (ref 3.0–12.0)
NEUTROS PCT: 68.2 % (ref 43.0–77.0)
Neutro Abs: 6.3 10*3/uL (ref 1.4–7.7)
Platelets: 223 10*3/uL (ref 150.0–400.0)
RBC: 5.17 Mil/uL — ABNORMAL HIGH (ref 3.87–5.11)
RDW: 13.9 % (ref 11.5–15.5)
WBC: 9.2 10*3/uL (ref 4.0–10.5)

## 2016-04-20 LAB — POCT URINALYSIS DIPSTICK
BILIRUBIN UA: NEGATIVE
GLUCOSE UA: NEGATIVE
KETONES UA: NEGATIVE
Leukocytes, UA: NEGATIVE
Nitrite, UA: NEGATIVE
PH UA: 6.5
Protein, UA: NEGATIVE
RBC UA: NEGATIVE
Urobilinogen, UA: 0.2

## 2016-04-20 LAB — VITAMIN D 25 HYDROXY (VIT D DEFICIENCY, FRACTURES): VITD: 24.97 ng/mL — AB (ref 30.00–100.00)

## 2016-04-20 LAB — LIPID PANEL
CHOL/HDL RATIO: 8
Cholesterol: 318 mg/dL — ABNORMAL HIGH (ref 0–200)
HDL: 38.2 mg/dL — ABNORMAL LOW (ref >39.00)
NONHDL: 280.02
Triglycerides: 347 mg/dL — ABNORMAL HIGH (ref 0.0–149.0)
VLDL: 69.4 mg/dL — AB (ref 0.0–40.0)

## 2016-04-20 LAB — LDL CHOLESTEROL, DIRECT: Direct LDL: 186 mg/dL

## 2016-04-20 LAB — HEMOGLOBIN A1C: HEMOGLOBIN A1C: 5.4 % (ref 4.6–6.5)

## 2016-04-20 LAB — TSH: TSH: 1.36 u[IU]/mL (ref 0.35–4.50)

## 2016-04-20 NOTE — Progress Notes (Signed)
Pre visit review using our clinic review tool, if applicable. No additional management support is needed unless otherwise documented below in the visit note. 

## 2016-04-20 NOTE — Assessment & Plan Note (Signed)
Friable lesion seen. Pending Pap. Also pending referral to GYN as patient may require further evaluation, biopsy.

## 2016-04-20 NOTE — Assessment & Plan Note (Signed)
Stable. Continues to follow with neuropsychiatry in [place].

## 2016-04-20 NOTE — Assessment & Plan Note (Signed)
Colonoscopy due next year. Mammogram is due this month and patient will call and schedule. Pap done today along with CBE. Does not have smoking history of greater than 30 years. Up-to-date on tetanus. Pending screening labs today, including HIV. Continues to follow with dermatology history of melanoma.

## 2016-04-20 NOTE — Assessment & Plan Note (Addendum)
Normotensive today. No HA today. Advised to have annual eye exam. Review sure by normal neurologic exam, absence of acute coronary symptoms. Patient is not orthostatic ( see flow sheet). Will have patient monitor blood pressure for the next week and call with abnormal, and follow-up in one more week.

## 2016-04-20 NOTE — Progress Notes (Signed)
Subjective:    Patient ID: Brittney Ruiz, female    DOB: 12-May-1966, 50 y.o.   MRN: [id number]  CC: [name] is a 50 y.o. female who presents today for physical exam.    HPI:  Anxiety- Follows with [facility] in [facility].   HTN- Started on metoprolol when hospitalized coming off of xanax for anxiety and HTN. Has been checking BP when has HA and getting waxing and waning BP 147/98 to 154/101. She also endorses h/o vertigo, balance issues, and tinnitus of right ear. Right ear popping and HA. No HA today. No vertigo, fever, chills. Also thought 'it may be weather.'  Denies exertional chest pain or pressure, numbness or tingling radiating to left arm or jaw, palpitations, dizziness,  changes in vision, or shortness of breath.  Last eye exam on year ago.   Reports foul odor of urine and would like to check urine. No dysuria, fever, flank pain.      Colorectal Cancer Screening: Due next year.  Breast Cancer Screening: Due 04/2106.  Cervical Cancer Screening: Due. Last 2012, negative malignancy. Unknown HPV Bone Health screening/DEXA for 65+: No increased fracture risk. Defer screening at this time. Lung Cancer Screening: Doesn't have 30 year pack year history and age > 83 years. Immunizations       Tetanus - UTD         HIV Screening- Candidate for  Labs: Screening labs today. Exercise: Gets regular exercise.  Alcohol use: None Smoking/tobacco use: Former smoker.  Regular dental exams: In need of dental exam. Wears seat belt: Yes. Skin: follows with dermatology; ho melanoma on right foot  HISTORY:  Past Medical History:  Diagnosis Date  . Allergy   . Anxiety   . Asthma   . Cancer ([place])    melanoma 2003  . Depression   . Hypertension   . Obesity (BMI 30-39.9)   . Vaginal delivery    X 2    Past Surgical History:  Procedure Laterality Date  . LEEP  1995  . SKIN CANCER EXCISION     melanoma   Family History  Problem Relation Age of Onset  . Hyperlipidemia  Mother   . Hypertension Mother   . Diabetes Mother   . Breast cancer Mother 57  . Cancer Father     lung  . Depression Father   . Hyperlipidemia Sister   . Hypertension Sister   . Migraines Sister   . Hypertension Brother   . Hyperlipidemia Brother   . Migraines Brother   . Cancer Maternal Grandmother     brain  . Depression Maternal Grandfather   . Heart disease Maternal Grandfather   . Cancer Paternal Grandfather     unknown      ALLERGIES: Aciphex [rabeprazole sodium]; Antivert [meclizine hcl]; Buspar [buspirone hcl]; Chlorpheniramine-pseudoeph; Codeine; Cymbalta [duloxetine hcl]; Effexor xr [venlafaxine hydrochloride]; Epinephrine; Erythromycin; Escitalopram oxalate; Lamictal [lamotrigine]; Levofloxacin; Lipitor [atorvastatin]; Mometasone furoate; Mometasone furoate; Neurontin [gabapentin]; Prednisone; Sudafed [pseudoephedrine hcl]; Sulfa antibiotics; Valium; Wellbutrin [bupropion hcl]; and Xanax xr [alprazolam]  Current Outpatient Prescriptions on File Prior to Visit  Medication Sig Dispense Refill  . ACETAMINOPHEN PO Take by mouth as needed.      Marland Kitchen albuterol (PROVENTIL HFA;VENTOLIN HFA) 108 (90 Base) MCG/ACT inhaler Inhale 2 puffs into the lungs every 6 (six) hours as needed. 1 Inhaler 1  . amoxicillin (AMOXIL) 500 MG capsule Take 1 capsule (500 mg total) by mouth 2 (two) times daily. 14 capsule 0  . clorazepate (TRANXENE)  7.5 MG tablet Take 7.5 mg by mouth 3 (three) times daily.    [name] Calcium (STOOL SOFTENER PO) Take 1 tablet by mouth as needed.      Marland Kitchen EPINEPHrine (EPIPEN) 0.3 mg/0.3 mL DEVI Inject 0.3 mLs (0.3 mg total) into the muscle once. 1 Device 1  . FIBER SELECT GUMMIES PO Take by mouth.    . fluticasone (FLONASE) 50 MCG/ACT nasal spray Place 2 sprays into both nostrils daily. 16 g 6  . loratadine (CLARITIN) 10 MG tablet TAKE ONE (1) TABLET BY MOUTH EVERY DAY 90 tablet 3  . Multiple Vitamin (MULTIVITAMIN) tablet Take 1 tablet by mouth daily.    Marland Kitchen nystatin  (MYCOSTATIN) powder Apply topically 2 (two) times daily. 30 g 0  . omeprazole (PRILOSEC) 20 MG capsule TAKE 1 CAPSULE DAILY. 90 capsule 0  . propranolol (INDERAL) 20 MG tablet Take 1 tablet (20 mg total) by mouth 3 (three) times daily. 90 tablet 3  . sertraline (ZOLOFT) 100 MG tablet TAKE 1+1/2 TABLETS BY MOUTH DAILY AS DIRECTED 45 tablet 3  . Spacer/Aero Chamber Mouthpiece MISC 1 puff by Does not apply route as needed. 1 each 1  . vitamin C (ASCORBIC ACID) 500 MG tablet Take 1,000 mg by mouth daily.      No current facility-administered medications on file prior to visit.     Social History  Substance Use Topics  . Smoking status: Former Smoker    Quit date: 03/09/2008  . Smokeless tobacco: Never Used  . Alcohol use No    Review of Systems  Constitutional: Negative for chills, fever and unexpected weight change.  HENT: Negative for congestion, ear discharge and ear pain.   Eyes: Negative for visual disturbance.  Respiratory: Negative for cough and shortness of breath.   Cardiovascular: Negative for chest pain, palpitations and leg swelling.  Gastrointestinal: Negative for nausea and vomiting.  Genitourinary: Negative for dysuria.  Musculoskeletal: Negative for arthralgias and myalgias.  Skin: Negative for rash.  Neurological: Positive for dizziness and headaches. Negative for syncope.  Hematological: Negative for adenopathy.  Psychiatric/Behavioral: Negative for confusion.      Objective:    BP 116/88 Comment: standing  Pulse 73   Temp 97.8 F (36.6 C) (Oral)   Ht 5\' 2"  (1.575 m)   Wt 211 lb 9.6 oz (96 kg)   SpO2 97%   BMI 38.70 kg/m   BP Readings from Last 3 Encounters:  04/20/16 116/88  03/16/16 112/78  09/17/15 98/70   Wt Readings from Last 3 Encounters:  04/20/16 211 lb 9.6 oz (96 kg)  03/16/16 212 lb (96.2 kg)  09/17/15 210 lb (95.3 kg)    Physical Exam  Constitutional: She appears well-developed and well-nourished.  HENT:  Head: Normocephalic and  atraumatic.  Right Ear: Hearing, tympanic membrane, external ear and ear canal normal. No swelling or tenderness. Tympanic membrane is not erythematous and not bulging. No middle ear effusion.  Left Ear: Tympanic membrane, external ear and ear canal normal. No swelling or tenderness. Tympanic membrane is not erythematous and not bulging.  No middle ear effusion.  Nose: Nose normal. No rhinorrhea. Right sinus exhibits no maxillary sinus tenderness and no frontal sinus tenderness. Left sinus exhibits no maxillary sinus tenderness and no frontal sinus tenderness.  Mouth/Throat: Uvula is midline, oropharynx is clear and moist and mucous membranes are normal. No posterior oropharyngeal edema or posterior oropharyngeal erythema.  Eyes: Conjunctivae, EOM and lids are normal. Pupils are equal, round, and reactive to light. Lids  are everted and swept, no foreign bodies found.  Normal fundus bilaterally   Neck: No thyroid mass and no thyromegaly present.  Cardiovascular: Normal rate, regular rhythm, normal heart sounds and normal pulses.   Pulmonary/Chest: Effort normal and breath sounds normal. She has no wheezes. She has no rhonchi. She has no rales. Right breast exhibits no inverted nipple, no mass, no nipple discharge, no skin change and no tenderness. Left breast exhibits no inverted nipple, no mass, no nipple discharge, no skin change and no tenderness. Breasts are symmetrical.  No masses or asymmetry appreciated during CBE.  Genitourinary: Uterus is not enlarged, not fixed and not tender. Cervix exhibits friability. Cervix exhibits no motion tenderness and no discharge. Right adnexum displays no mass, no tenderness and no fullness. Left adnexum displays no mass, no tenderness and no fullness.  Genitourinary Comments: Pap performed. No CMT. Unable to appreciated ovaries. Friable lesion seen on cervix.  Lymphadenopathy:       Head (right side): No submental, no submandibular, no tonsillar, no preauricular,  no posterior auricular and no occipital adenopathy present.       Head (left side): No submental, no submandibular, no tonsillar, no preauricular, no posterior auricular and no occipital adenopathy present.    She has no cervical adenopathy.       Right cervical: No superficial cervical, no deep cervical and no posterior cervical adenopathy present.      Left cervical: No superficial cervical, no deep cervical and no posterior cervical adenopathy present.    She has no axillary adenopathy.       Right axillary: No pectoral and no lateral adenopathy present.       Left axillary: No pectoral and no lateral adenopathy present. Neurological: She is alert. She has normal strength. No cranial nerve deficit or sensory deficit. She displays a negative Romberg sign.  Reflex Scores:      Bicep reflexes are 2+ on the right side and 2+ on the left side.      Patellar reflexes are 2+ on the right side and 2+ on the left side. Grip equal and strong bilateral upper extremities. Gait strong and steady. Able to perform  finger-to-nose and RAM's without difficulty.  Skin: Skin is warm and dry.  Psychiatric: She has a normal mood and affect. Her speech is normal and behavior is normal. Thought content normal.  Vitals reviewed.      Assessment & Plan:   Problem List Items Addressed This Visit      Other   Anxiety    Stable. Continues to follow with neuropsychiatry in [place].      Routine physical examination - Primary    Colonoscopy due next year. Mammogram is due this month and patient will call and schedule. Pap done today along with CBE. Does not have smoking history of greater than 30 years. Up-to-date on tetanus. Pending screening labs today, including HIV. Continues to follow with dermatology history of melanoma.      Relevant Orders   MM DIGITAL SCREENING BILATERAL   CBC with Differential/Platelet   Comprehensive metabolic panel   Hemoglobin A1c   HIV antibody   Lipid panel   Cytology  - PAP   TSH   VITAMIN D 25 Hydroxy (Vit-D Deficiency, Fractures)   POCT urinalysis dipstick (Completed)   Cervicovaginal ancillary only   Elevated blood pressure reading    Normotensive today. No HA today. Advised to have annual eye exam. Review sure by normal neurologic exam, absence of acute coronary  symptoms. Patient is not orthostatic ( see flow sheet). Will have patient monitor blood pressure for the next week and call with abnormal, and follow-up in one more week.      Lesion of cervix    Friable lesion seen. Pending Pap. Also pending referral to GYN as patient may require further evaluation, biopsy.      Relevant Orders   Ambulatory referral to Gynecology    Other Visit Diagnoses    Bad odor of urine       Relevant Orders   CULTURE, URINE COMPREHENSIVE       I am having Ms. Dauzat maintain her Docusate Calcium (STOOL SOFTENER PO), Spacer/Aero Chamber Mouthpiece, ACETAMINOPHEN PO, EPINEPHrine, clorazepate, vitamin C, multivitamin, FIBER SELECT GUMMIES PO, sertraline, nystatin, loratadine, propranolol, fluticasone, amoxicillin, albuterol, and omeprazole.   No orders of the defined types were placed in this encounter.   Return precautions given.   Risks, benefits, and alternatives of the medications and treatment plan prescribed today were discussed, and patient expressed understanding.   Education regarding symptom management and diagnosis given to patient on AVS.   Continue to follow with Mable Paris, FNP for routine health maintenance.   Aggie Hacker and I agreed with plan.   Mable Paris, FNP

## 2016-04-20 NOTE — Patient Instructions (Addendum)
Continue to monitor BP.  If headache continues, please call and let me know.   Follow up in one week for BP check.    We placed a referral. Mammogram this year. I asked that you call one the below locations and schedule this when it is convenient for you.   If you have dense breasts, you may ask for 3D mammogram over the traditional 2D mammogram as new evidence suggest 3D is superior. Please note that NOT all insurance companies cover 3D and you may have to pay a higher copay. You may call your insurance company to further clarify your benefits.   Options for [place]  [place], [place]  * [facility], [place] * Note if you ask for 3D mammogram at this location, you must request [place], [place] location*

## 2016-04-21 LAB — HIV ANTIBODY (ROUTINE TESTING W REFLEX): HIV 1&2 Ab, 4th Generation: NONREACTIVE

## 2016-04-21 LAB — CYTOLOGY - PAP
DIAGNOSIS: NEGATIVE
HPV (WINDOPATH): NOT DETECTED

## 2016-04-21 LAB — CERVICOVAGINAL ANCILLARY ONLY: WET PREP (BD AFFIRM): NEGATIVE

## 2016-04-22 ENCOUNTER — Encounter: Payer: Self-pay | Admitting: Family

## 2016-04-22 LAB — CULTURE, URINE COMPREHENSIVE: Organism ID, Bacteria: NO GROWTH

## 2016-04-23 ENCOUNTER — Other Ambulatory Visit: Payer: Self-pay | Admitting: Family

## 2016-04-23 DIAGNOSIS — Z1231 Encounter for screening mammogram for malignant neoplasm of breast: Secondary | ICD-10-CM

## 2016-04-24 ENCOUNTER — Ambulatory Visit
Admission: RE | Admit: 2016-04-24 | Discharge: 2016-04-24 | Disposition: A | Discharge status: Home / Self Care | Payer: Medicare Other | Source: Ambulatory Visit | Attending: Family | Admitting: Family

## 2016-04-24 DIAGNOSIS — Z1231 Encounter for screening mammogram for malignant neoplasm of breast: Secondary | ICD-10-CM | POA: Diagnosis not present

## 2016-04-28 ENCOUNTER — Telehealth: Payer: Self-pay | Admitting: Family

## 2016-04-28 NOTE — Telephone Encounter (Signed)
Please advise 

## 2016-04-28 NOTE — Telephone Encounter (Signed)
Called pt to confirm appt for tomorrow for a BP check and wanted to know if she still needs to come in. She has been taking her BP and it is lower. Pt also wanted to know what she had found during her pap smear. Please advise, thank you!  12/10 - right 145/96 left 121/88 12/9 - 114/80 12/8 - 133/81 evening 121/81 - morning 12/7 eve 127/80 am 125/86 12/6 eve 121/91 am 122/83 12/5eve 121/91 mid 124/84 am 130/90 12/4 eve 136/96  Am 116/88  The majority was before medication.   Call pt @ [phone]

## 2016-04-29 ENCOUNTER — Ambulatory Visit: Payer: Medicare Other | Admitting: Family

## 2016-04-29 NOTE — Telephone Encounter (Signed)
Patient has been informed.

## 2016-04-29 NOTE — Telephone Encounter (Signed)
Call pt Pap neg malignancy and HPV  Pleased with BP; great news! She can cancel appt and continue to monitor BP 1-2 per week. Goal 140/90 or less.

## 2016-05-04 DIAGNOSIS — L304 Erythema intertrigo: Secondary | ICD-10-CM | POA: Diagnosis not present

## 2016-05-04 DIAGNOSIS — L814 Other melanin hyperpigmentation: Secondary | ICD-10-CM | POA: Diagnosis not present

## 2016-05-04 DIAGNOSIS — D485 Neoplasm of uncertain behavior of skin: Secondary | ICD-10-CM | POA: Diagnosis not present

## 2016-05-04 DIAGNOSIS — L821 Other seborrheic keratosis: Secondary | ICD-10-CM | POA: Diagnosis not present

## 2016-05-04 DIAGNOSIS — D225 Melanocytic nevi of trunk: Secondary | ICD-10-CM | POA: Diagnosis not present

## 2016-05-04 DIAGNOSIS — Z85820 Personal history of malignant melanoma of skin: Secondary | ICD-10-CM | POA: Diagnosis not present

## 2016-05-14 ENCOUNTER — Telehealth: Payer: Self-pay | Admitting: Family

## 2016-05-14 NOTE — Telephone Encounter (Signed)
Please advise 

## 2016-05-14 NOTE — Telephone Encounter (Signed)
Pt called about still coughing and congested and not feeling well. Pt has two more days of the antibiotic. Pt is coughing up green mucus and her cough is so deep she is concerned about that . Pt states she normally needs two rounds of antibiotic. Please advise?  Pharmacy is [facility] on [address] [place]. Thank you!

## 2016-05-15 NOTE — Telephone Encounter (Signed)
Please advise that I would like her to complete antibiotic. If she is clinically better, even if not 100 percent, it is far safer to NOT do a second round of an antibiotic . That can cause more gut infections.   Encourage mucinex for continued congestion.

## 2016-05-15 NOTE — Telephone Encounter (Signed)
Mychart message has been sent to inform patient. 

## 2016-05-21 ENCOUNTER — Telehealth: Payer: Self-pay | Admitting: Family

## 2016-05-21 DIAGNOSIS — J209 Acute bronchitis, unspecified: Secondary | ICD-10-CM

## 2016-05-21 MED ORDER — ALBUTEROL SULFATE HFA 108 (90 BASE) MCG/ACT IN AERS: 2.0000 | INHALATION_SPRAY | Freq: Four times a day (QID) | RESPIRATORY_TRACT | 1 refills | Status: DC | PRN

## 2016-05-21 NOTE — Telephone Encounter (Signed)
Left message for patient to return call back.  

## 2016-05-21 NOTE — Telephone Encounter (Signed)
Call pt ( no mychart email)  Advise for the have CXR ( ordered)  I reordered albuterol inhaler to [facility].   I want to CXR because this will change what anitbiotics we would use  Does she have fever?  Any fever or a lot of wheezing, she needs another OV since I have not seen her in one month

## 2016-05-22 NOTE — Telephone Encounter (Signed)
Left message for patient to return call back.  

## 2016-05-25 ENCOUNTER — Encounter: Payer: Self-pay | Admitting: Family

## 2016-05-26 ENCOUNTER — Telehealth: Payer: Self-pay | Admitting: Family

## 2016-05-26 ENCOUNTER — Ambulatory Visit (INDEPENDENT_AMBULATORY_CARE_PROVIDER_SITE_OTHER): Payer: Medicare Other

## 2016-05-26 ENCOUNTER — Ambulatory Visit: Payer: Medicare Other

## 2016-05-26 DIAGNOSIS — J209 Acute bronchitis, unspecified: Secondary | ICD-10-CM | POA: Diagnosis not present

## 2016-05-26 DIAGNOSIS — R05 Cough: Secondary | ICD-10-CM | POA: Diagnosis not present

## 2016-05-26 MED ORDER — ALBUTEROL SULFATE HFA 108 (90 BASE) MCG/ACT IN AERS
2.0000 | INHALATION_SPRAY | Freq: Four times a day (QID) | RESPIRATORY_TRACT | 1 refills | Status: DC | PRN
Start: 2016-05-26 — End: 2016-05-28

## 2016-05-26 MED ORDER — DOXYCYCLINE HYCLATE 100 MG PO TABS: 100.0000 mg | ORAL_TABLET | Freq: Two times a day (BID) | ORAL | 0 refills | Status: DC

## 2016-05-26 NOTE — Telephone Encounter (Signed)
Wheezing. No fever.  Will come in for CXR

## 2016-05-27 NOTE — Telephone Encounter (Signed)
05/26/2016 approx 3:30 pm  Patient came in for chest x-ray and I saw patient in the  hallway. I decided to set of vitals as I had heard her coughing.  SaO296%, heart rate 71, temperature 98.6 Fahrenheit, blood pressure 120/80.   She was speaking normally and  in no acute respiratory distress. Cardiac- RRR. Respiratory-scant expiratory wheezes posterior RML and LML on limited exam.   Patient and I jointly decided that we would wait on x-ray results and she would fill antibiotic as we had previously decided. She understands to follow up with me in 1-2  days if she is not feeling better on new regimen.

## 2016-05-28 ENCOUNTER — Other Ambulatory Visit: Payer: Self-pay

## 2016-05-28 ENCOUNTER — Encounter: Payer: Self-pay | Admitting: Family

## 2016-05-28 DIAGNOSIS — J209 Acute bronchitis, unspecified: Secondary | ICD-10-CM

## 2016-05-28 MED ORDER — ALBUTEROL SULFATE HFA 108 (90 BASE) MCG/ACT IN AERS
2.0000 | INHALATION_SPRAY | Freq: Four times a day (QID) | RESPIRATORY_TRACT | 1 refills | Status: DC | PRN
Start: 2016-05-28 — End: 2020-04-08

## 2016-05-28 NOTE — Telephone Encounter (Signed)
Medication has been resent in due to pharmacy not receiving fax

## 2016-06-05 ENCOUNTER — Ambulatory Visit: Payer: Medicare Other

## 2016-06-09 ENCOUNTER — Encounter: Payer: Self-pay | Admitting: Family

## 2016-06-16 DIAGNOSIS — R87615 Unsatisfactory cytologic smear of cervix: Secondary | ICD-10-CM | POA: Diagnosis not present

## 2016-06-16 DIAGNOSIS — N889 Noninflammatory disorder of cervix uteri, unspecified: Secondary | ICD-10-CM | POA: Diagnosis not present

## 2016-06-24 ENCOUNTER — Ambulatory Visit (INDEPENDENT_AMBULATORY_CARE_PROVIDER_SITE_OTHER): Payer: Medicare Other

## 2016-06-24 ENCOUNTER — Ambulatory Visit: Payer: Medicare Other

## 2016-06-24 VITALS — BP 118/80 | HR 64 | Temp 97.9°F | Resp 14 | Ht 62.0 in | Wt 206.1 lb

## 2016-06-24 DIAGNOSIS — Z0000 Encounter for general adult medical examination without abnormal findings: Secondary | ICD-10-CM

## 2016-06-24 NOTE — Progress Notes (Signed)
Subjective:   Brittney Ruiz is a 51 y.o. female who presents for Medicare Annual (Subsequent) preventive examination.  Review of Systems:  No ROS.  Medicare Wellness Visit.  Cardiac Risk Factors include: advanced age (>86men, >76 women);hypertension;obesity (BMI >30kg/m2)     Objective:     Vitals: BP 118/80 (BP Location: Right Arm, Patient Position: Sitting, Cuff Size: Normal)   Pulse 64   Temp 97.9 F (36.6 C) (Oral)   Resp 14   Ht 5\' 2"  (1.575 m)   Wt 206 lb 1.9 oz (93.5 kg)   SpO2 96%   BMI 37.70 kg/m   Body mass index is 37.7 kg/m.   Tobacco History  Smoking Status  . Former Smoker  . Quit date: 03/09/2008  Smokeless Tobacco  . Never Used     Counseling given: Not Answered   Past Medical History:  Diagnosis Date  . Allergy   . Anxiety   . Asthma   . Cancer ([place])    melanoma 2003  . Depression   . Hypertension   . Obesity (BMI 30-39.9)   . Vaginal delivery    X 2   Past Surgical History:  Procedure Laterality Date  . LEEP  1995  . SKIN CANCER EXCISION     melanoma   Family History  Problem Relation Age of Onset  . Hyperlipidemia Mother   . Hypertension Mother   . Diabetes Mother   . Breast cancer Mother 19  . Cancer Father     lung  . Depression Father   . Hyperlipidemia Sister   . Hypertension Sister   . Migraines Sister   . Hypertension Brother   . Hyperlipidemia Brother   . Migraines Brother   . Cancer Maternal Grandmother     brain  . Depression Maternal Grandfather   . Heart disease Maternal Grandfather   . Cancer Paternal Grandfather     unknown   History  Sexual Activity  . Sexual activity: Yes    Outpatient Encounter Prescriptions as of 06/24/2016  Medication Sig  . ACETAMINOPHEN PO Take by mouth as needed.    Marland Kitchen albuterol (PROVENTIL HFA;VENTOLIN HFA) 108 (90 Base) MCG/ACT inhaler Inhale 2 puffs into the lungs every 6 (six) hours as needed.  . cholecalciferol (VITAMIN D) 400 units TABS tablet Take 400 Units by mouth.    . clorazepate (TRANXENE) 7.5 MG tablet Take 7.5 mg by mouth 3 (three) times daily.  Brittney Ruiz Calcium (STOOL SOFTENER PO) Take 1 tablet by mouth as needed.    . doxycycline (VIBRA-TABS) 100 MG tablet Take 1 tablet (100 mg total) by mouth 2 (two) times daily.  Marland Kitchen EPINEPHrine (EPIPEN) 0.3 mg/0.3 mL DEVI Inject 0.3 mLs (0.3 mg total) into the muscle once.  Marland Kitchen FIBER SELECT GUMMIES PO Take by mouth.  . fluticasone (FLONASE) 50 MCG/ACT nasal spray Place 2 sprays into both nostrils daily.  Marland Kitchen loratadine (CLARITIN) 10 MG tablet TAKE ONE (1) TABLET BY MOUTH EVERY DAY  . Multiple Vitamin (MULTIVITAMIN) tablet Take 1 tablet by mouth daily.  Marland Kitchen nystatin (MYCOSTATIN) powder Apply topically 2 (two) times daily.  Marland Kitchen omeprazole (PRILOSEC) 20 MG capsule TAKE 1 CAPSULE DAILY.  Marland Kitchen propranolol (INDERAL) 20 MG tablet Take 1 tablet (20 mg total) by mouth 3 (three) times daily.  . sertraline (ZOLOFT) 100 MG tablet TAKE 1+1/2 TABLETS BY MOUTH DAILY AS DIRECTED  . Spacer/Aero Chamber Mouthpiece MISC 1 puff by Does not apply route as needed.  . vitamin C (ASCORBIC ACID) 500  MG tablet Take 1,000 mg by mouth daily.    No facility-administered encounter medications on file as of 06/24/2016.     Activities of Daily Living In your present state of health, do you have any difficulty performing the following activities: 06/24/2016  Hearing? N  Vision? N  Difficulty concentrating or making decisions? N  Walking or climbing stairs? Y  Dressing or bathing? N  Doing errands, shopping? N  Preparing Food and eating ? N  Using the Toilet? N  In the past six months, have you accidently leaked urine? Y  Do you have problems with loss of bowel control? N  Managing your Medications? N  Managing your Finances? N  Housekeeping or managing your Housekeeping? N  Some recent data might be hidden    Patient Care Team: Burnard Hawthorne, FNP as PCP - General (Family Medicine)    Assessment:    This is a routine wellness examination  for Brittney Ruiz. The goal of the wellness visit is to assist the patient how to close the gaps in care and create a preventative care plan for the patient.   Taking calcium VIT D3 as appropriate/Osteoporosis risk reviewed.  Medications reviewed; taking without issues or barriers.  Safety issues reviewed; smoke detectors in the home. No firearms in the home. Wears seatbelts when driving or riding with others. No violence in the home.  No identified risk were noted; The patient was oriented x 3; appropriate in dress and manner and no objective failures at ADL's or IADL's.   BMI; discussed the importance of a healthy diet, water intake and exercise. Educational material provided.  HTN; followed by PCP.  Health maintenance gaps; closed.  Patient Concerns: None at this time. Follow up with PCP as needed.  Exercise Activities and Dietary recommendations Current Exercise Habits: Home exercise routine, Type of exercise: walking, Time (Minutes): 60, Frequency (Times/Week): 2, Weekly Exercise (Minutes/Week): 120, Intensity: Moderate  Goals    . Increase physical activity          Stay active and walk for exercise 5 days a week, moderate pace      Fall Risk Fall Risk  06/24/2016 04/20/2016  Falls in the past year? No No   Depression Screen PHQ 2/9 Scores 06/24/2016 04/20/2016 05/28/2015 04/21/2012  PHQ - 2 Score 0 0 0 0     Cognitive Function     6CIT Screen 06/24/2016  What Year? 0 points  What month? 0 points  What time? 0 points  Count back from 20 0 points  Months in reverse 0 points  Repeat phrase 0 points  Total Score 0    Immunization History  Administered Date(s) Administered  . Influenza Split 04/17/2013  . Influenza Whole 05/18/2008, 05/29/2011  . Influenza-Unspecified 03/28/2015, 02/16/2016  . Td 05/18/1997  . Tdap 07/16/2009   Screening Tests Health Maintenance  Topic Date Due  . PAP SMEAR  04/21/2019  . TETANUS/TDAP  07/17/2019  . INFLUENZA VACCINE  Completed   . HIV Screening  Completed      Plan:    End of life planning; Advance aging; Advanced directives discussed. No HCPOA/Living Will.  Additional information provided to help her start the conversation with her family.  Copy of HCPOA/Living Will short forms requested upon completion. Time spent in this topic is 23 minutes.  Medicare Attestation I have personally reviewed: The patient's medical and social history Their use of alcohol, tobacco or illicit drugs Their current medications and supplements The patient's functional ability including ADLs,fall  risks, home safety risks, cognitive, and hearing and visual impairment Diet and physical activities Evidence for depression   The patient's weight, height, BMI, and visual acuity have been recorded in the chart.  I have made referrals and provided education to the patient based on review of the above and I have provided the patient with a written personalized care plan for preventive services.    During the course of the visit the patient was educated and counseled about the following appropriate screening and preventive services:   Vaccines to include Pneumoccal, Influenza, Hepatitis B, Td, Zostavax, HCV  Electrocardiogram  Cardiovascular Disease  Colorectal cancer screening  Bone density screening  Diabetes screening  Glaucoma screening  Mammography/PAP  Nutrition counseling   Patient Instructions (the written plan) was given to the patient.   Varney Biles, LPN  [id number]

## 2016-06-24 NOTE — Progress Notes (Signed)
I reviewed the above note and agree.  November Sypher, M.D. 

## 2016-06-24 NOTE — Patient Instructions (Addendum)
  Brittney Ruiz , Thank you for taking time to come for your Medicare Wellness Visit. I appreciate your ongoing commitment to your health goals. Please review the following plan we discussed and let me know if I can assist you in the future.   Follow up with Mable Paris, FNP as needed.  These are the goals we discussed: Goals    . Increase physical activity          Stay active and walk for exercise 5 days a week, moderate pace       This is a list of the screening recommended for you and due dates:  Health Maintenance  Topic Date Due  . Pap Smear  04/21/2019  . Tetanus Vaccine  07/17/2019  . Flu Shot  Completed  . HIV Screening  Completed

## 2016-07-09 DIAGNOSIS — Z87410 Personal history of cervical dysplasia: Secondary | ICD-10-CM | POA: Diagnosis not present

## 2016-07-09 DIAGNOSIS — N812 Incomplete uterovaginal prolapse: Secondary | ICD-10-CM | POA: Diagnosis not present

## 2016-07-09 DIAGNOSIS — R87610 Atypical squamous cells of undetermined significance on cytologic smear of cervix (ASC-US): Secondary | ICD-10-CM | POA: Diagnosis not present

## 2016-07-09 DIAGNOSIS — D069 Carcinoma in situ of cervix, unspecified: Secondary | ICD-10-CM | POA: Diagnosis not present

## 2016-07-09 DIAGNOSIS — E669 Obesity, unspecified: Secondary | ICD-10-CM | POA: Diagnosis not present

## 2016-07-09 DIAGNOSIS — N393 Stress incontinence (female) (male): Secondary | ICD-10-CM | POA: Diagnosis not present

## 2016-07-21 DIAGNOSIS — F419 Anxiety disorder, unspecified: Secondary | ICD-10-CM | POA: Diagnosis not present

## 2016-07-21 DIAGNOSIS — F324 Major depressive disorder, single episode, in partial remission: Secondary | ICD-10-CM | POA: Diagnosis not present

## 2016-07-30 DIAGNOSIS — Z4689 Encounter for fitting and adjustment of other specified devices: Secondary | ICD-10-CM | POA: Diagnosis not present

## 2016-08-20 ENCOUNTER — Ambulatory Visit (INDEPENDENT_AMBULATORY_CARE_PROVIDER_SITE_OTHER): Payer: Medicare Other | Admitting: Family

## 2016-08-20 ENCOUNTER — Encounter: Payer: Self-pay | Admitting: Family

## 2016-08-20 VITALS — BP 124/86 | HR 67 | Temp 97.9°F | Ht 62.0 in | Wt 206.0 lb

## 2016-08-20 DIAGNOSIS — J40 Bronchitis, not specified as acute or chronic: Secondary | ICD-10-CM | POA: Insufficient documentation

## 2016-08-20 DIAGNOSIS — K5909 Other constipation: Secondary | ICD-10-CM | POA: Insufficient documentation

## 2016-08-20 MED ORDER — POLYETHYLENE GLYCOL 3350 17 GM/SCOOP PO POWD: 17.0000 g | Freq: Every day | ORAL | 3 refills | Status: DC

## 2016-08-20 NOTE — Patient Instructions (Addendum)
Pleasure seeing you   Stay on mucinex and if in a couple of days , not better, let me know  Stop peri colace  Start miralax and may titrate to a couple times of week to keep bowel movements regular. Let me know if this doesn't help. Plenty of water.

## 2016-08-20 NOTE — Assessment & Plan Note (Signed)
Working diagnosis of viral URI x 4 days. No acute respiratory distress. Afebrile. Patient and I agreed upon conservative therapy at this time with symptom management, close observation, and delayed antibiotic treatment. If worsens, we discussed starting augmentin as that has worked well for sinus congestion in the past.

## 2016-08-20 NOTE — Progress Notes (Signed)
Pre visit review using our clinic review tool, if applicable. No additional management support is needed unless otherwise documented below in the visit note. 

## 2016-08-20 NOTE — Assessment & Plan Note (Signed)
Chronic. Education provided - exercise, water, high fiber which patient is doing a great job with. She will stop peri colace for now and titrate miralax. She will let me know how she is doing.

## 2016-08-20 NOTE — Progress Notes (Signed)
Subjective:    Patient ID: Brittney Ruiz, female    DOB: March 22, 1966, 51 y.o.   MRN: [id number]  CC: Brittney Ruiz is a 51 y.o. female who presents today for an acute visit.    HPI: CC: cough, 4 days ago.  Thick green nasal Mucous, hoarseness. Has tried tyelonol, mucinex, with some relief. No fever, chills, wheezing, SOB. Using claritin, dimetapp and flonase every day.    h/o seasonal allergies  Former smoker  Also would like prescription for miralax as cheaper when prescribed. Take peri colace and metamucil for regular BMs. No consiptation today. No abdominal pain, N,. V.         HISTORY:  Past Medical History:  Diagnosis Date  . Allergy   . Anxiety   . Asthma   . Cancer ([place])    melanoma 2003  . Depression   . Hypertension   . Obesity (BMI 30-39.9)   . Vaginal delivery    X 2   Past Surgical History:  Procedure Laterality Date  . LEEP  1995  . SKIN CANCER EXCISION     melanoma   Family History  Problem Relation Age of Onset  . Hyperlipidemia Mother   . Hypertension Mother   . Diabetes Mother   . Breast cancer Mother 51  . Cancer Father     lung  . Depression Father   . Hyperlipidemia Sister   . Hypertension Sister   . Migraines Sister   . Hypertension Brother   . Hyperlipidemia Brother   . Migraines Brother   . Cancer Maternal Grandmother     brain  . Depression Maternal Grandfather   . Heart disease Maternal Grandfather   . Cancer Paternal Grandfather     unknown    Allergies: Aciphex [rabeprazole sodium]; Antivert [meclizine hcl]; Buspar [buspirone hcl]; Chlorpheniramine-pseudoeph; Codeine; Cymbalta [duloxetine hcl]; Effexor xr [venlafaxine hydrochloride]; Epinephrine; Erythromycin; Escitalopram oxalate; Lamictal [lamotrigine]; Levofloxacin; Lipitor [atorvastatin]; Mometasone furoate; Mometasone furoate; Neurontin [gabapentin]; Prednisone; Sudafed [pseudoephedrine hcl]; Sulfa antibiotics; Valium; Wellbutrin [bupropion hcl]; and Xanax xr  [alprazolam] Current Outpatient Prescriptions on File Prior to Visit  Medication Sig Dispense Refill  . ACETAMINOPHEN PO Take by mouth as needed.      Marland Kitchen albuterol (PROVENTIL HFA;VENTOLIN HFA) 108 (90 Base) MCG/ACT inhaler Inhale 2 puffs into the lungs every 6 (six) hours as needed. 1 Inhaler 1  . cholecalciferol (VITAMIN D) 400 units TABS tablet Take 400 Units by mouth.    . clorazepate (TRANXENE) 7.5 MG tablet Take 7.5 mg by mouth 3 (three) times daily.    Marland Kitchen EPINEPHrine (EPIPEN) 0.3 mg/0.3 mL DEVI Inject 0.3 mLs (0.3 mg total) into the muscle once. 1 Device 1  . FIBER SELECT GUMMIES PO Take by mouth.    . fluticasone (FLONASE) 50 MCG/ACT nasal spray Place 2 sprays into both nostrils daily. 16 g 6  . loratadine (CLARITIN) 10 MG tablet TAKE ONE (1) TABLET BY MOUTH EVERY DAY 90 tablet 3  . Multiple Vitamin (MULTIVITAMIN) tablet Take 1 tablet by mouth daily.    Marland Kitchen nystatin (MYCOSTATIN) powder Apply topically 2 (two) times daily. 30 g 0  . omeprazole (PRILOSEC) 20 MG capsule TAKE 1 CAPSULE DAILY. 90 capsule 0  . propranolol (INDERAL) 20 MG tablet Take 1 tablet (20 mg total) by mouth 3 (three) times daily. 90 tablet 3  . sertraline (ZOLOFT) 100 MG tablet TAKE 1+1/2 TABLETS BY MOUTH DAILY AS DIRECTED 45 tablet 3  . Spacer/Aero Chamber Mouthpiece MISC 1 puff by Does not  apply route as needed. 1 each 1  . vitamin C (ASCORBIC ACID) 500 MG tablet Take 1,000 mg by mouth daily.      No current facility-administered medications on file prior to visit.     Social History  Substance Use Topics  . Smoking status: Former Smoker    Quit date: 03/09/2008  . Smokeless tobacco: Never Used  . Alcohol use No    Review of Systems  Constitutional: Negative for chills and fever.  HENT: Positive for congestion, sinus pain and voice change. Negative for trouble swallowing.   Eyes: Negative for visual disturbance.  Respiratory: Positive for cough and wheezing. Negative for shortness of breath.   Cardiovascular:  Negative for chest pain and palpitations.  Gastrointestinal: Negative for nausea and vomiting.  Neurological: Negative for headaches.      Objective:    BP 124/86   Pulse 67   Temp 97.9 F (36.6 C) (Oral)   Ht 5\' 2"  (1.575 m)   Wt 206 lb (93.4 kg)   SpO2 96%   BMI 37.68 kg/m    Physical Exam  Constitutional: She appears well-developed and well-nourished.  HENT:  Head: Normocephalic and atraumatic.  Right Ear: Hearing, tympanic membrane, external ear and ear canal normal. No drainage, swelling or tenderness. No foreign bodies. Tympanic membrane is not erythematous and not bulging. No middle ear effusion. No decreased hearing is noted.  Left Ear: Hearing, tympanic membrane, external ear and ear canal normal. No drainage, swelling or tenderness. No foreign bodies. Tympanic membrane is not erythematous and not bulging.  No middle ear effusion. No decreased hearing is noted.  Nose: Rhinorrhea present. Right sinus exhibits no maxillary sinus tenderness and no frontal sinus tenderness. Left sinus exhibits no maxillary sinus tenderness and no frontal sinus tenderness.  Mouth/Throat: Uvula is midline, oropharynx is clear and moist and mucous membranes are normal. No oropharyngeal exudate, posterior oropharyngeal edema, posterior oropharyngeal erythema or tonsillar abscesses.  Eyes: Conjunctivae are normal.  Cardiovascular: Regular rhythm, normal heart sounds and normal pulses.   Pulmonary/Chest: Effort normal and breath sounds normal. She has no wheezes. She has no rhonchi. She has no rales.  Lymphadenopathy:       Head (right side): No submental, no submandibular, no tonsillar, no preauricular, no posterior auricular and no occipital adenopathy present.       Head (left side): No submental, no submandibular, no tonsillar, no preauricular, no posterior auricular and no occipital adenopathy present.    She has no cervical adenopathy.  Neurological: She is alert.  Skin: Skin is warm and dry.   Psychiatric: She has a normal mood and affect. Her speech is normal and behavior is normal. Thought content normal.  Vitals reviewed.      Assessment & Plan:   Problem List Items Addressed This Visit      Respiratory   Bronchitis - Primary    Working diagnosis of viral URI x 4 days. No acute respiratory distress. Afebrile. Patient and I agreed upon conservative therapy at this time with symptom management, close observation, and delayed antibiotic treatment. If worsens, we discussed starting augmentin as that has worked well for sinus congestion in the past.          Digestive   Chronic constipation    Chronic. Education provided - exercise, water, high fiber which patient is doing a great job with. She will stop peri colace for now and titrate miralax. She will let me know how she is doing.  Relevant Medications   polyethylene glycol powder (GLYCOLAX/MIRALAX) powder         I have discontinued Ms. Malcomb's Docusate Calcium (STOOL SOFTENER PO) and doxycycline. I am also having her start on polyethylene glycol powder. Additionally, I am having her maintain her Spacer/Aero Chamber Mouthpiece, ACETAMINOPHEN PO, EPINEPHrine, clorazepate, vitamin C, multivitamin, FIBER SELECT GUMMIES PO, sertraline, nystatin, loratadine, propranolol, fluticasone, omeprazole, albuterol, and cholecalciferol.   Meds ordered this encounter  Medications  . polyethylene glycol powder (GLYCOLAX/MIRALAX) powder    Sig: Take 17 g by mouth daily. PRN. May titrate to once every 3 days for regular BM    Dispense:  3350 g    Refill:  3    Order Specific Question:   Supervising Provider    Answer:   Crecencio Mc [[id number]]    Return precautions given.   Risks, benefits, and alternatives of the medications and treatment plan prescribed today were discussed, and patient expressed understanding.   Education regarding symptom management and diagnosis given to patient on AVS.  Continue to follow with  Mable Paris, FNP for routine health maintenance.   Aggie Hacker and I agreed with plan.   Mable Paris, FNP

## 2016-08-21 ENCOUNTER — Telehealth: Payer: Self-pay | Admitting: Family

## 2016-08-21 DIAGNOSIS — J329 Chronic sinusitis, unspecified: Secondary | ICD-10-CM

## 2016-08-21 MED ORDER — AMOXICILLIN 500 MG PO TABS: 500.0000 mg | ORAL_TABLET | Freq: Two times a day (BID) | ORAL | 0 refills | Status: DC

## 2016-08-21 NOTE — Telephone Encounter (Signed)
Reason for call: cough worse today Symptoms: some shortness of breath, coughing, congested , head congestion, wheezing, hoarse, feels worse today Duration 4 days  Medications: tussionex ,dimetapp cold and allergy ,flonase , claritin, albuterol inahaler  Last seen for this problem: Mable Paris , FNP , please advise Seen by:08/20/16

## 2016-08-21 NOTE — Telephone Encounter (Signed)
Left voice mail to call back 

## 2016-08-21 NOTE — Telephone Encounter (Signed)
Pt called back per [name] stated if she did not feel better. Pt states she woke up this morning coughing up all kinds of green stuff. Pt wants to know if she can get a antibiotic. Pt voice is hoarse. Please advise?  Call pt @ [phone]. Thank you!

## 2016-08-21 NOTE — Telephone Encounter (Signed)
Kristen,  Please send in amoxicillin 500mg  BID P.O. x 7 days. Total 14 tablets 0 refills  Advise probiotics and to let us know if lot better.   Please let patient know as well.

## 2016-08-21 NOTE — Telephone Encounter (Signed)
Left voice mail to call back, rx sent

## 2016-08-24 ENCOUNTER — Other Ambulatory Visit: Payer: Self-pay

## 2016-08-24 ENCOUNTER — Encounter: Payer: Self-pay | Admitting: Family

## 2016-08-24 MED ORDER — AMOXICILLIN-POT CLAVULANATE ER 1000-62.5 MG PO TB12: 2.0000 | ORAL_TABLET | Freq: Two times a day (BID) | ORAL | 0 refills | Status: DC

## 2016-08-24 MED ORDER — AMOXICILLIN 500 MG PO TABS: 500.0000 mg | ORAL_TABLET | Freq: Two times a day (BID) | ORAL | 0 refills | Status: AC

## 2016-08-24 NOTE — Telephone Encounter (Signed)
Call pt  Sent in augmentin  Advise to stay on miralax and metamucil  She may add colace back  If that doesn't help, she may also try ducolax suppository.   LOTS of water

## 2016-08-24 NOTE — Telephone Encounter (Signed)
Ordered augmentin

## 2016-08-24 NOTE — Telephone Encounter (Addendum)
Patient calls back stating she prefers to try augmentin ,usually amoxicillin doesn't work., .  Usually it takes two rounds of amoxicillin.   I informed patient I tried to reach her twice on Friday letting her know script sent she stated her phone was dead and when it was charged back up office was closed .  Patient also states you put on Miralax  And taking Metamucil and had a small bowel hard in form, straining  To pass bowel movement on Friday .  Please advise.

## 2016-08-24 NOTE — Telephone Encounter (Signed)
Patient advised of below and verbalized an understanding  

## 2016-08-24 NOTE — Telephone Encounter (Signed)
Cancelled script for amoxicillin at [facility] since augmentin sent in.  Spoke with Marylyn Ishihara.

## 2016-08-27 DIAGNOSIS — Z4689 Encounter for fitting and adjustment of other specified devices: Secondary | ICD-10-CM | POA: Diagnosis not present

## 2016-09-04 ENCOUNTER — Telehealth: Payer: Self-pay | Admitting: Family

## 2016-09-04 NOTE — Telephone Encounter (Signed)
Pt called and left a vm c/o of not feeling any better. She stated that the Augmentin that was called in work for about 3-4 days. Very hard to understand message because she still sounds very hoarse and is coughing a lot. Please advise, thank you!  Call pt @ [phone]

## 2016-09-04 NOTE — Telephone Encounter (Signed)
Reason for call: cough  Symptoms:symptoms returned yesterday cough with yellow production, wheezing,  symptoms better for 3 days,  Also now developed diarrhea  Duration  Medications:albuterol, still on antibiotic, Tussin, ibuprofen 400 mg, alternating tylenol 1000 mg , pericolace miralax  Last seen for this problem: 08/20/16 Seen by: Please advise

## 2016-09-07 ENCOUNTER — Telehealth: Payer: Self-pay | Admitting: Family

## 2016-09-07 NOTE — Telephone Encounter (Signed)
Patient has been on Antibiotics.

## 2016-09-07 NOTE — Telephone Encounter (Signed)
Patient was informed of other directions. Patient had no question comments or concerns.

## 2016-09-07 NOTE — Telephone Encounter (Signed)
Called patient back states she is too nauseated to come in. She states she has nebulizer machine at home her albuterol for nebulizer  is expired .   Patient will call back for appointment tomorrow if she doesn't feel better of seek treatment .

## 2016-09-07 NOTE — Telephone Encounter (Signed)
Call pt-  She didn't get mychart message 3 days ago  Ms Brittney Ruiz,   I sent augmentin to [facility] for you.   Make sure you are on a probiotic for a couple weeks after medication as well.   Hope you are better soon!   [name]

## 2016-09-07 NOTE — Telephone Encounter (Signed)
Still wheezing using albuterol, coughing is accompained with wheezing. Sore throat.  Taking Tussin/cold and allergy  for cough.  Tylenol every 8 hours.    Still has three days left of antibiotic.  Not able to probiotic interferes with anxiety medications per patient .    Now nauseated since Friday.  No fever .   Yolanda Bonine is also nauseated.   Please advise.

## 2016-09-07 NOTE — Telephone Encounter (Signed)
Call pt  Is the albuterol not helping wheeze? Couldn't tell from  Message, are symptoms improving?  augmentin ( or any abx) can cause diarrhea so I suspect from augmentin.   If cough, wheezing has returned after abx, she needs to be seen by one of [name] here.

## 2016-09-07 NOTE — Telephone Encounter (Signed)
Call pt She needs appt  May need neb in office; if no appts here today, advise urgent care

## 2016-09-08 NOTE — Telephone Encounter (Signed)
Noted.   Thank you for advising her to call us if worsens.

## 2016-11-25 ENCOUNTER — Other Ambulatory Visit: Payer: Self-pay

## 2016-11-25 DIAGNOSIS — K5909 Other constipation: Secondary | ICD-10-CM

## 2016-11-25 MED ORDER — POLYETHYLENE GLYCOL 3350 17 GM/SCOOP PO POWD: 17.0000 g | Freq: Every day | ORAL | 3 refills | Status: DC

## 2016-12-07 DIAGNOSIS — R51 Headache: Secondary | ICD-10-CM | POA: Diagnosis not present

## 2016-12-07 DIAGNOSIS — M542 Cervicalgia: Secondary | ICD-10-CM | POA: Diagnosis not present

## 2016-12-07 DIAGNOSIS — M9901 Segmental and somatic dysfunction of cervical region: Secondary | ICD-10-CM | POA: Diagnosis not present

## 2016-12-07 DIAGNOSIS — M9903 Segmental and somatic dysfunction of lumbar region: Secondary | ICD-10-CM | POA: Diagnosis not present

## 2016-12-07 DIAGNOSIS — M545 Low back pain: Secondary | ICD-10-CM | POA: Diagnosis not present

## 2016-12-07 DIAGNOSIS — M9902 Segmental and somatic dysfunction of thoracic region: Secondary | ICD-10-CM | POA: Diagnosis not present

## 2016-12-09 DIAGNOSIS — M542 Cervicalgia: Secondary | ICD-10-CM | POA: Diagnosis not present

## 2016-12-09 DIAGNOSIS — M545 Low back pain: Secondary | ICD-10-CM | POA: Diagnosis not present

## 2016-12-09 DIAGNOSIS — M9902 Segmental and somatic dysfunction of thoracic region: Secondary | ICD-10-CM | POA: Diagnosis not present

## 2016-12-09 DIAGNOSIS — R51 Headache: Secondary | ICD-10-CM | POA: Diagnosis not present

## 2016-12-09 DIAGNOSIS — M9903 Segmental and somatic dysfunction of lumbar region: Secondary | ICD-10-CM | POA: Diagnosis not present

## 2016-12-09 DIAGNOSIS — M9901 Segmental and somatic dysfunction of cervical region: Secondary | ICD-10-CM | POA: Diagnosis not present

## 2016-12-11 DIAGNOSIS — M545 Low back pain: Secondary | ICD-10-CM | POA: Diagnosis not present

## 2016-12-11 DIAGNOSIS — M9903 Segmental and somatic dysfunction of lumbar region: Secondary | ICD-10-CM | POA: Diagnosis not present

## 2016-12-11 DIAGNOSIS — M542 Cervicalgia: Secondary | ICD-10-CM | POA: Diagnosis not present

## 2016-12-11 DIAGNOSIS — M9902 Segmental and somatic dysfunction of thoracic region: Secondary | ICD-10-CM | POA: Diagnosis not present

## 2016-12-11 DIAGNOSIS — M9901 Segmental and somatic dysfunction of cervical region: Secondary | ICD-10-CM | POA: Diagnosis not present

## 2016-12-11 DIAGNOSIS — R51 Headache: Secondary | ICD-10-CM | POA: Diagnosis not present

## 2016-12-14 DIAGNOSIS — M9902 Segmental and somatic dysfunction of thoracic region: Secondary | ICD-10-CM | POA: Diagnosis not present

## 2016-12-14 DIAGNOSIS — M9903 Segmental and somatic dysfunction of lumbar region: Secondary | ICD-10-CM | POA: Diagnosis not present

## 2016-12-14 DIAGNOSIS — M545 Low back pain: Secondary | ICD-10-CM | POA: Diagnosis not present

## 2016-12-14 DIAGNOSIS — M9901 Segmental and somatic dysfunction of cervical region: Secondary | ICD-10-CM | POA: Diagnosis not present

## 2016-12-14 DIAGNOSIS — R51 Headache: Secondary | ICD-10-CM | POA: Diagnosis not present

## 2016-12-14 DIAGNOSIS — M542 Cervicalgia: Secondary | ICD-10-CM | POA: Diagnosis not present

## 2016-12-16 DIAGNOSIS — M545 Low back pain: Secondary | ICD-10-CM | POA: Diagnosis not present

## 2016-12-16 DIAGNOSIS — M9901 Segmental and somatic dysfunction of cervical region: Secondary | ICD-10-CM | POA: Diagnosis not present

## 2016-12-16 DIAGNOSIS — M9902 Segmental and somatic dysfunction of thoracic region: Secondary | ICD-10-CM | POA: Diagnosis not present

## 2016-12-16 DIAGNOSIS — M542 Cervicalgia: Secondary | ICD-10-CM | POA: Diagnosis not present

## 2016-12-16 DIAGNOSIS — R51 Headache: Secondary | ICD-10-CM | POA: Diagnosis not present

## 2016-12-16 DIAGNOSIS — M9903 Segmental and somatic dysfunction of lumbar region: Secondary | ICD-10-CM | POA: Diagnosis not present

## 2017-01-25 DIAGNOSIS — F419 Anxiety disorder, unspecified: Secondary | ICD-10-CM | POA: Diagnosis not present

## 2017-01-25 DIAGNOSIS — F324 Major depressive disorder, single episode, in partial remission: Secondary | ICD-10-CM | POA: Diagnosis not present

## 2017-02-01 ENCOUNTER — Telehealth: Payer: Self-pay | Admitting: Cardiology

## 2017-02-01 NOTE — Telephone Encounter (Signed)
3 attempts to schedule fu appt from recall list.   Deleting recall.   

## 2017-02-22 DIAGNOSIS — M791 Myalgia, unspecified site: Secondary | ICD-10-CM | POA: Diagnosis not present

## 2017-02-22 DIAGNOSIS — M5430 Sciatica, unspecified side: Secondary | ICD-10-CM | POA: Diagnosis not present

## 2017-02-22 DIAGNOSIS — M9902 Segmental and somatic dysfunction of thoracic region: Secondary | ICD-10-CM | POA: Diagnosis not present

## 2017-02-22 DIAGNOSIS — M722 Plantar fascial fibromatosis: Secondary | ICD-10-CM | POA: Diagnosis not present

## 2017-02-22 DIAGNOSIS — G540 Brachial plexus disorders: Secondary | ICD-10-CM | POA: Diagnosis not present

## 2017-02-22 DIAGNOSIS — R51 Headache: Secondary | ICD-10-CM | POA: Diagnosis not present

## 2017-02-22 DIAGNOSIS — M9905 Segmental and somatic dysfunction of pelvic region: Secondary | ICD-10-CM | POA: Diagnosis not present

## 2017-02-22 DIAGNOSIS — M9903 Segmental and somatic dysfunction of lumbar region: Secondary | ICD-10-CM | POA: Diagnosis not present

## 2017-02-22 DIAGNOSIS — Z72820 Sleep deprivation: Secondary | ICD-10-CM | POA: Diagnosis not present

## 2017-02-22 DIAGNOSIS — M9901 Segmental and somatic dysfunction of cervical region: Secondary | ICD-10-CM | POA: Diagnosis not present

## 2017-02-24 DIAGNOSIS — M791 Myalgia, unspecified site: Secondary | ICD-10-CM | POA: Diagnosis not present

## 2017-02-24 DIAGNOSIS — G540 Brachial plexus disorders: Secondary | ICD-10-CM | POA: Diagnosis not present

## 2017-02-24 DIAGNOSIS — M9903 Segmental and somatic dysfunction of lumbar region: Secondary | ICD-10-CM | POA: Diagnosis not present

## 2017-02-24 DIAGNOSIS — M5430 Sciatica, unspecified side: Secondary | ICD-10-CM | POA: Diagnosis not present

## 2017-02-24 DIAGNOSIS — M9902 Segmental and somatic dysfunction of thoracic region: Secondary | ICD-10-CM | POA: Diagnosis not present

## 2017-02-24 DIAGNOSIS — R51 Headache: Secondary | ICD-10-CM | POA: Diagnosis not present

## 2017-02-24 DIAGNOSIS — M722 Plantar fascial fibromatosis: Secondary | ICD-10-CM | POA: Diagnosis not present

## 2017-02-24 DIAGNOSIS — M9905 Segmental and somatic dysfunction of pelvic region: Secondary | ICD-10-CM | POA: Diagnosis not present

## 2017-02-24 DIAGNOSIS — Z72820 Sleep deprivation: Secondary | ICD-10-CM | POA: Diagnosis not present

## 2017-02-24 DIAGNOSIS — M9901 Segmental and somatic dysfunction of cervical region: Secondary | ICD-10-CM | POA: Diagnosis not present

## 2017-02-26 DIAGNOSIS — M722 Plantar fascial fibromatosis: Secondary | ICD-10-CM | POA: Diagnosis not present

## 2017-02-26 DIAGNOSIS — R51 Headache: Secondary | ICD-10-CM | POA: Diagnosis not present

## 2017-02-26 DIAGNOSIS — G540 Brachial plexus disorders: Secondary | ICD-10-CM | POA: Diagnosis not present

## 2017-02-26 DIAGNOSIS — M5430 Sciatica, unspecified side: Secondary | ICD-10-CM | POA: Diagnosis not present

## 2017-02-26 DIAGNOSIS — M9905 Segmental and somatic dysfunction of pelvic region: Secondary | ICD-10-CM | POA: Diagnosis not present

## 2017-02-26 DIAGNOSIS — M791 Myalgia, unspecified site: Secondary | ICD-10-CM | POA: Diagnosis not present

## 2017-02-26 DIAGNOSIS — M9901 Segmental and somatic dysfunction of cervical region: Secondary | ICD-10-CM | POA: Diagnosis not present

## 2017-02-26 DIAGNOSIS — M9903 Segmental and somatic dysfunction of lumbar region: Secondary | ICD-10-CM | POA: Diagnosis not present

## 2017-02-26 DIAGNOSIS — M9902 Segmental and somatic dysfunction of thoracic region: Secondary | ICD-10-CM | POA: Diagnosis not present

## 2017-02-26 DIAGNOSIS — Z72820 Sleep deprivation: Secondary | ICD-10-CM | POA: Diagnosis not present

## 2017-03-01 DIAGNOSIS — M9905 Segmental and somatic dysfunction of pelvic region: Secondary | ICD-10-CM | POA: Diagnosis not present

## 2017-03-01 DIAGNOSIS — R51 Headache: Secondary | ICD-10-CM | POA: Diagnosis not present

## 2017-03-01 DIAGNOSIS — G540 Brachial plexus disorders: Secondary | ICD-10-CM | POA: Diagnosis not present

## 2017-03-01 DIAGNOSIS — M722 Plantar fascial fibromatosis: Secondary | ICD-10-CM | POA: Diagnosis not present

## 2017-03-01 DIAGNOSIS — M9902 Segmental and somatic dysfunction of thoracic region: Secondary | ICD-10-CM | POA: Diagnosis not present

## 2017-03-01 DIAGNOSIS — Z72820 Sleep deprivation: Secondary | ICD-10-CM | POA: Diagnosis not present

## 2017-03-01 DIAGNOSIS — M9901 Segmental and somatic dysfunction of cervical region: Secondary | ICD-10-CM | POA: Diagnosis not present

## 2017-03-01 DIAGNOSIS — M9903 Segmental and somatic dysfunction of lumbar region: Secondary | ICD-10-CM | POA: Diagnosis not present

## 2017-03-01 DIAGNOSIS — M5430 Sciatica, unspecified side: Secondary | ICD-10-CM | POA: Diagnosis not present

## 2017-03-01 DIAGNOSIS — M791 Myalgia, unspecified site: Secondary | ICD-10-CM | POA: Diagnosis not present

## 2017-03-03 DIAGNOSIS — R51 Headache: Secondary | ICD-10-CM | POA: Diagnosis not present

## 2017-03-03 DIAGNOSIS — M9901 Segmental and somatic dysfunction of cervical region: Secondary | ICD-10-CM | POA: Diagnosis not present

## 2017-03-03 DIAGNOSIS — M5430 Sciatica, unspecified side: Secondary | ICD-10-CM | POA: Diagnosis not present

## 2017-03-03 DIAGNOSIS — M9902 Segmental and somatic dysfunction of thoracic region: Secondary | ICD-10-CM | POA: Diagnosis not present

## 2017-03-03 DIAGNOSIS — G540 Brachial plexus disorders: Secondary | ICD-10-CM | POA: Diagnosis not present

## 2017-03-03 DIAGNOSIS — M9903 Segmental and somatic dysfunction of lumbar region: Secondary | ICD-10-CM | POA: Diagnosis not present

## 2017-03-03 DIAGNOSIS — M791 Myalgia, unspecified site: Secondary | ICD-10-CM | POA: Diagnosis not present

## 2017-03-03 DIAGNOSIS — M9905 Segmental and somatic dysfunction of pelvic region: Secondary | ICD-10-CM | POA: Diagnosis not present

## 2017-03-03 DIAGNOSIS — M722 Plantar fascial fibromatosis: Secondary | ICD-10-CM | POA: Diagnosis not present

## 2017-03-03 DIAGNOSIS — Z72820 Sleep deprivation: Secondary | ICD-10-CM | POA: Diagnosis not present

## 2017-03-05 DIAGNOSIS — M5430 Sciatica, unspecified side: Secondary | ICD-10-CM | POA: Diagnosis not present

## 2017-03-05 DIAGNOSIS — M722 Plantar fascial fibromatosis: Secondary | ICD-10-CM | POA: Diagnosis not present

## 2017-03-05 DIAGNOSIS — M9902 Segmental and somatic dysfunction of thoracic region: Secondary | ICD-10-CM | POA: Diagnosis not present

## 2017-03-05 DIAGNOSIS — M791 Myalgia, unspecified site: Secondary | ICD-10-CM | POA: Diagnosis not present

## 2017-03-05 DIAGNOSIS — G540 Brachial plexus disorders: Secondary | ICD-10-CM | POA: Diagnosis not present

## 2017-03-05 DIAGNOSIS — M9903 Segmental and somatic dysfunction of lumbar region: Secondary | ICD-10-CM | POA: Diagnosis not present

## 2017-03-05 DIAGNOSIS — R51 Headache: Secondary | ICD-10-CM | POA: Diagnosis not present

## 2017-03-05 DIAGNOSIS — Z72820 Sleep deprivation: Secondary | ICD-10-CM | POA: Diagnosis not present

## 2017-03-05 DIAGNOSIS — Z23 Encounter for immunization: Secondary | ICD-10-CM | POA: Diagnosis not present

## 2017-03-05 DIAGNOSIS — M9905 Segmental and somatic dysfunction of pelvic region: Secondary | ICD-10-CM | POA: Diagnosis not present

## 2017-03-05 DIAGNOSIS — M9901 Segmental and somatic dysfunction of cervical region: Secondary | ICD-10-CM | POA: Diagnosis not present

## 2017-03-08 DIAGNOSIS — M9902 Segmental and somatic dysfunction of thoracic region: Secondary | ICD-10-CM | POA: Diagnosis not present

## 2017-03-08 DIAGNOSIS — R51 Headache: Secondary | ICD-10-CM | POA: Diagnosis not present

## 2017-03-08 DIAGNOSIS — M9903 Segmental and somatic dysfunction of lumbar region: Secondary | ICD-10-CM | POA: Diagnosis not present

## 2017-03-08 DIAGNOSIS — M9901 Segmental and somatic dysfunction of cervical region: Secondary | ICD-10-CM | POA: Diagnosis not present

## 2017-03-08 DIAGNOSIS — M791 Myalgia, unspecified site: Secondary | ICD-10-CM | POA: Diagnosis not present

## 2017-03-08 DIAGNOSIS — M722 Plantar fascial fibromatosis: Secondary | ICD-10-CM | POA: Diagnosis not present

## 2017-03-08 DIAGNOSIS — M9905 Segmental and somatic dysfunction of pelvic region: Secondary | ICD-10-CM | POA: Diagnosis not present

## 2017-03-08 DIAGNOSIS — G540 Brachial plexus disorders: Secondary | ICD-10-CM | POA: Diagnosis not present

## 2017-03-08 DIAGNOSIS — M5430 Sciatica, unspecified side: Secondary | ICD-10-CM | POA: Diagnosis not present

## 2017-03-08 DIAGNOSIS — Z72820 Sleep deprivation: Secondary | ICD-10-CM | POA: Diagnosis not present

## 2017-03-10 DIAGNOSIS — M791 Myalgia, unspecified site: Secondary | ICD-10-CM | POA: Diagnosis not present

## 2017-03-10 DIAGNOSIS — Z72820 Sleep deprivation: Secondary | ICD-10-CM | POA: Diagnosis not present

## 2017-03-10 DIAGNOSIS — M9905 Segmental and somatic dysfunction of pelvic region: Secondary | ICD-10-CM | POA: Diagnosis not present

## 2017-03-10 DIAGNOSIS — M5430 Sciatica, unspecified side: Secondary | ICD-10-CM | POA: Diagnosis not present

## 2017-03-10 DIAGNOSIS — M722 Plantar fascial fibromatosis: Secondary | ICD-10-CM | POA: Diagnosis not present

## 2017-03-10 DIAGNOSIS — G540 Brachial plexus disorders: Secondary | ICD-10-CM | POA: Diagnosis not present

## 2017-03-10 DIAGNOSIS — R51 Headache: Secondary | ICD-10-CM | POA: Diagnosis not present

## 2017-03-10 DIAGNOSIS — M9901 Segmental and somatic dysfunction of cervical region: Secondary | ICD-10-CM | POA: Diagnosis not present

## 2017-03-10 DIAGNOSIS — M9902 Segmental and somatic dysfunction of thoracic region: Secondary | ICD-10-CM | POA: Diagnosis not present

## 2017-03-10 DIAGNOSIS — M9903 Segmental and somatic dysfunction of lumbar region: Secondary | ICD-10-CM | POA: Diagnosis not present

## 2017-03-12 DIAGNOSIS — J189 Pneumonia, unspecified organism: Secondary | ICD-10-CM | POA: Diagnosis not present

## 2017-03-22 DIAGNOSIS — R51 Headache: Secondary | ICD-10-CM | POA: Diagnosis not present

## 2017-03-22 DIAGNOSIS — M9902 Segmental and somatic dysfunction of thoracic region: Secondary | ICD-10-CM | POA: Diagnosis not present

## 2017-03-22 DIAGNOSIS — M722 Plantar fascial fibromatosis: Secondary | ICD-10-CM | POA: Diagnosis not present

## 2017-03-22 DIAGNOSIS — G540 Brachial plexus disorders: Secondary | ICD-10-CM | POA: Diagnosis not present

## 2017-03-22 DIAGNOSIS — M9901 Segmental and somatic dysfunction of cervical region: Secondary | ICD-10-CM | POA: Diagnosis not present

## 2017-03-22 DIAGNOSIS — M9903 Segmental and somatic dysfunction of lumbar region: Secondary | ICD-10-CM | POA: Diagnosis not present

## 2017-03-22 DIAGNOSIS — M5430 Sciatica, unspecified side: Secondary | ICD-10-CM | POA: Diagnosis not present

## 2017-03-22 DIAGNOSIS — M9905 Segmental and somatic dysfunction of pelvic region: Secondary | ICD-10-CM | POA: Diagnosis not present

## 2017-03-22 DIAGNOSIS — Z72820 Sleep deprivation: Secondary | ICD-10-CM | POA: Diagnosis not present

## 2017-03-22 DIAGNOSIS — M791 Myalgia, unspecified site: Secondary | ICD-10-CM | POA: Diagnosis not present

## 2017-03-26 DIAGNOSIS — M722 Plantar fascial fibromatosis: Secondary | ICD-10-CM | POA: Diagnosis not present

## 2017-03-26 DIAGNOSIS — M9902 Segmental and somatic dysfunction of thoracic region: Secondary | ICD-10-CM | POA: Diagnosis not present

## 2017-03-26 DIAGNOSIS — M5430 Sciatica, unspecified side: Secondary | ICD-10-CM | POA: Diagnosis not present

## 2017-03-26 DIAGNOSIS — M9901 Segmental and somatic dysfunction of cervical region: Secondary | ICD-10-CM | POA: Diagnosis not present

## 2017-03-26 DIAGNOSIS — Z72820 Sleep deprivation: Secondary | ICD-10-CM | POA: Diagnosis not present

## 2017-03-26 DIAGNOSIS — M791 Myalgia, unspecified site: Secondary | ICD-10-CM | POA: Diagnosis not present

## 2017-03-26 DIAGNOSIS — M9903 Segmental and somatic dysfunction of lumbar region: Secondary | ICD-10-CM | POA: Diagnosis not present

## 2017-03-26 DIAGNOSIS — M9905 Segmental and somatic dysfunction of pelvic region: Secondary | ICD-10-CM | POA: Diagnosis not present

## 2017-03-26 DIAGNOSIS — R51 Headache: Secondary | ICD-10-CM | POA: Diagnosis not present

## 2017-03-26 DIAGNOSIS — G540 Brachial plexus disorders: Secondary | ICD-10-CM | POA: Diagnosis not present

## 2017-03-29 ENCOUNTER — Other Ambulatory Visit: Payer: Self-pay | Admitting: Family

## 2017-03-29 DIAGNOSIS — Z72820 Sleep deprivation: Secondary | ICD-10-CM | POA: Diagnosis not present

## 2017-03-29 DIAGNOSIS — M791 Myalgia, unspecified site: Secondary | ICD-10-CM | POA: Diagnosis not present

## 2017-03-29 DIAGNOSIS — M9903 Segmental and somatic dysfunction of lumbar region: Secondary | ICD-10-CM | POA: Diagnosis not present

## 2017-03-29 DIAGNOSIS — M722 Plantar fascial fibromatosis: Secondary | ICD-10-CM | POA: Diagnosis not present

## 2017-03-29 DIAGNOSIS — M9902 Segmental and somatic dysfunction of thoracic region: Secondary | ICD-10-CM | POA: Diagnosis not present

## 2017-03-29 DIAGNOSIS — M9901 Segmental and somatic dysfunction of cervical region: Secondary | ICD-10-CM | POA: Diagnosis not present

## 2017-03-29 DIAGNOSIS — Z1231 Encounter for screening mammogram for malignant neoplasm of breast: Secondary | ICD-10-CM

## 2017-03-29 DIAGNOSIS — M9905 Segmental and somatic dysfunction of pelvic region: Secondary | ICD-10-CM | POA: Diagnosis not present

## 2017-03-29 DIAGNOSIS — M5430 Sciatica, unspecified side: Secondary | ICD-10-CM | POA: Diagnosis not present

## 2017-03-29 DIAGNOSIS — R51 Headache: Secondary | ICD-10-CM | POA: Diagnosis not present

## 2017-03-29 DIAGNOSIS — G540 Brachial plexus disorders: Secondary | ICD-10-CM | POA: Diagnosis not present

## 2017-04-01 DIAGNOSIS — H524 Presbyopia: Secondary | ICD-10-CM | POA: Diagnosis not present

## 2017-04-20 ENCOUNTER — Ambulatory Visit (INDEPENDENT_AMBULATORY_CARE_PROVIDER_SITE_OTHER): Payer: Medicare HMO | Admitting: Internal Medicine

## 2017-04-20 ENCOUNTER — Ambulatory Visit (INDEPENDENT_AMBULATORY_CARE_PROVIDER_SITE_OTHER): Payer: Medicare HMO

## 2017-04-20 ENCOUNTER — Encounter: Payer: Self-pay | Admitting: Internal Medicine

## 2017-04-20 ENCOUNTER — Other Ambulatory Visit (HOSPITAL_COMMUNITY)
Admission: RE | Admit: 2017-04-20 | Discharge: 2017-04-20 | Disposition: A | Discharge status: Home / Self Care | Payer: Medicare HMO | Source: Ambulatory Visit | Attending: Internal Medicine | Admitting: Internal Medicine

## 2017-04-20 VITALS — BP 110/78 | HR 68 | Temp 98.3°F | Resp 18 | Ht 62.0 in | Wt 204.0 lb

## 2017-04-20 DIAGNOSIS — Z0000 Encounter for general adult medical examination without abnormal findings: Secondary | ICD-10-CM

## 2017-04-20 DIAGNOSIS — M5441 Lumbago with sciatica, right side: Secondary | ICD-10-CM

## 2017-04-20 DIAGNOSIS — Z1159 Encounter for screening for other viral diseases: Secondary | ICD-10-CM

## 2017-04-20 DIAGNOSIS — Z124 Encounter for screening for malignant neoplasm of cervix: Secondary | ICD-10-CM | POA: Diagnosis not present

## 2017-04-20 DIAGNOSIS — G8929 Other chronic pain: Secondary | ICD-10-CM

## 2017-04-20 DIAGNOSIS — N926 Irregular menstruation, unspecified: Secondary | ICD-10-CM | POA: Diagnosis not present

## 2017-04-20 DIAGNOSIS — K5909 Other constipation: Secondary | ICD-10-CM

## 2017-04-20 DIAGNOSIS — N889 Noninflammatory disorder of cervix uteri, unspecified: Secondary | ICD-10-CM | POA: Diagnosis not present

## 2017-04-20 DIAGNOSIS — R87619 Unspecified abnormal cytological findings in specimens from cervix uteri: Secondary | ICD-10-CM

## 2017-04-20 DIAGNOSIS — M5442 Lumbago with sciatica, left side: Secondary | ICD-10-CM | POA: Diagnosis not present

## 2017-04-20 DIAGNOSIS — Z1211 Encounter for screening for malignant neoplasm of colon: Secondary | ICD-10-CM | POA: Diagnosis not present

## 2017-04-20 DIAGNOSIS — R197 Diarrhea, unspecified: Secondary | ICD-10-CM | POA: Diagnosis not present

## 2017-04-20 DIAGNOSIS — T782XXS Anaphylactic shock, unspecified, sequela: Secondary | ICD-10-CM | POA: Diagnosis not present

## 2017-04-20 DIAGNOSIS — Z13220 Encounter for screening for lipoid disorders: Secondary | ICD-10-CM

## 2017-04-20 DIAGNOSIS — M48061 Spinal stenosis, lumbar region without neurogenic claudication: Secondary | ICD-10-CM | POA: Diagnosis not present

## 2017-04-20 LAB — T4, FREE: FREE T4: 0.82 ng/dL (ref 0.60–1.60)

## 2017-04-20 LAB — URINALYSIS, ROUTINE W REFLEX MICROSCOPIC
Bilirubin Urine: NEGATIVE
Ketones, ur: NEGATIVE
Leukocytes, UA: NEGATIVE
Nitrite: NEGATIVE
SPECIFIC GRAVITY, URINE: 1.015 (ref 1.000–1.030)
Total Protein, Urine: NEGATIVE
URINE GLUCOSE: NEGATIVE
Urobilinogen, UA: 0.2 (ref 0.0–1.0)
WBC UA: NONE SEEN (ref 0–?)
pH: 6 (ref 5.0–8.0)

## 2017-04-20 LAB — COMPREHENSIVE METABOLIC PANEL
ALBUMIN: 4.6 g/dL (ref 3.5–5.2)
ALK PHOS: 57 U/L (ref 39–117)
ALT: 23 U/L (ref 0–35)
AST: 21 U/L (ref 0–37)
BUN: 14 mg/dL (ref 6–23)
CHLORIDE: 102 meq/L (ref 96–112)
CO2: 28 mEq/L (ref 19–32)
CREATININE: 0.65 mg/dL (ref 0.40–1.20)
Calcium: 9.4 mg/dL (ref 8.4–10.5)
GFR: 102.47 mL/min (ref >60.00)
GLUCOSE: 116 mg/dL — AB (ref 70–99)
Potassium: 4.2 mEq/L (ref 3.5–5.1)
SODIUM: 138 meq/L (ref 135–145)
TOTAL PROTEIN: 7 g/dL (ref 6.0–8.3)
Total Bilirubin: 0.7 mg/dL (ref 0.2–1.2)

## 2017-04-20 LAB — CBC
HEMATOCRIT: 44 % (ref 36.0–46.0)
Hemoglobin: 14.9 g/dL (ref 12.0–15.0)
MCHC: 33.9 g/dL (ref 30.0–36.0)
MCV: 86.8 fl (ref 78.0–100.0)
Platelets: 206 10*3/uL (ref 150.0–400.0)
RBC: 5.07 Mil/uL (ref 3.87–5.11)
RDW: 14.3 % (ref 11.5–15.5)
WBC: 7.6 10*3/uL (ref 4.0–10.5)

## 2017-04-20 LAB — TSH: TSH: 1.95 u[IU]/mL (ref 0.35–4.50)

## 2017-04-20 LAB — LIPID PANEL
CHOLESTEROL: 316 mg/dL — AB (ref 0–200)
HDL: 36.1 mg/dL — AB (ref >39.00)
NONHDL: 279.47
TRIGLYCERIDES: 336 mg/dL — AB (ref 0.0–149.0)
Total CHOL/HDL Ratio: 9
VLDL: 67.2 mg/dL — ABNORMAL HIGH (ref 0.0–40.0)

## 2017-04-20 LAB — LDL CHOLESTEROL, DIRECT: Direct LDL: 200 mg/dL

## 2017-04-20 MED ORDER — EPINEPHRINE 0.3 MG/0.3ML IJ SOAJ: 0.3000 mg | Freq: Once | INTRAMUSCULAR | 11 refills | Status: AC

## 2017-04-20 NOTE — Patient Instructions (Signed)
Please follow up with Dr. [name]  Labs and Xray low back today  We will refer you to Dr. Napier Norris for colonoscopy and food testing.   Back Pain, Adult Back pain is very common. The pain often gets better over time. The cause of back pain is usually not dangerous. Most people can learn to manage their back pain on their own. Follow these instructions at home: Watch your back pain for any changes. The following actions may help to lessen any pain you are feeling:  Stay active. Start with short walks on flat ground if you can. Try to walk farther each day.  Exercise regularly as told by your doctor. Exercise helps your back heal faster. It also helps avoid future injury by keeping your muscles strong and flexible.  Do not sit, drive, or stand in one place for more than 30 minutes.  Do not stay in bed. Resting more than 1-2 days can slow down your recovery.  Be careful when you bend or lift an object. Use good form when lifting: ? Bend at your knees. ? Keep the object close to your body. ? Do not twist.  Sleep on a firm mattress. Lie on your side, and bend your knees. If you lie on your back, put a pillow under your knees.  Take medicines only as told by your doctor.  Put ice on the injured area. ? Put ice in a plastic bag. ? Place a towel between your skin and the bag. ? Leave the ice on for 20 minutes, 2-3 times a day for the first 2-3 days. After that, you can switch between ice and heat packs.  Avoid feeling anxious or stressed. Find good ways to deal with stress, such as exercise.  Maintain a healthy weight. Extra weight puts stress on your back.  Contact a doctor if:  You have pain that does not go away with rest or medicine.  You have worsening pain that goes down into your legs or buttocks.  You have pain that does not get better in one week.  You have pain at night.  You lose weight.  You have a fever or chills. Get help right away if:  You cannot  control when you poop (bowel movement) or pee (urinate).  Your arms or legs feel weak.  Your arms or legs lose feeling (numbness).  You feel sick to your stomach (nauseous) or throw up (vomit).  You have belly (abdominal) pain.  You feel like you may pass out (faint). This information is not intended to replace advice given to you by your health care provider. Make sure you discuss any questions you have with your health care provider. Document Released: 10/21/2007 Document Revised: 10/10/2015 Document Reviewed: 09/05/2013 Elsevier Interactive Patient Education  [employer].

## 2017-04-20 NOTE — Progress Notes (Signed)
Chief Complaint  Patient presents with  . Annual Exam   Pt presents for  1. Annual exam  2. Alternating diarrhea and constipation. Taking Metamucil and Miralax qd but if does not take constipated x 5 days. She wants to know about food allergies/insensitivities. Will defer to GI  3. C/o low back pain radiating down both legs causing mild numbness and tingling. Denies leg weakness, she does have chronic urinary incontinence, denies saddle anesthesia. She has been to several chiropractors some more helpful than others and also tried NSAIDs which help more than tylenol. She also reports lordosis in lumbar region of back. She also tries back brace/TENS unit, heat, ice, and pelvic exercises which helps. Pain at times 9/10 and feels like her back is going to give out 4. Obesity wt loss goal for pt is to be down 100 lbs  5. H/o abnormal pap and LEEP was told by [facility] last year to f/u with PCP. She also c/o abnormal cycles and will bleed x 2 weeks and then no cycle x 2 months will defer to OB/GYN  6. She wants Rx refill epipen though allergies say allergic she reports she has so many allergies she needs it but understands for allergic rxn to call 911    Review of Systems  Constitutional: Negative for weight loss.  HENT: Positive for tinnitus.        +ringing a little occasionally   Eyes:       +trouble seeing   Respiratory: Negative for shortness of breath.   Cardiovascular: Negative for leg swelling.  Gastrointestinal: Positive for constipation and diarrhea.       +alternating diarrhea and constipation if does not do metamucil and miralax qd will have constipation x 5 days or more   Genitourinary: Positive for frequency.       +urinary incontinence  +reduced libidoe   Musculoskeletal: Positive for back pain and joint pain.       +muscle aching/cramps  Skin: Negative for rash.  Neurological: Positive for dizziness and headaches.       +feels like going to fall sitting down on  standing   Endo/Heme/Allergies:       +irregular menses will bleed x 2 weeks and then no cycle x 2 months   Psychiatric/Behavioral: Positive for memory loss.   Past Medical History:  Diagnosis Date  . Allergy   . Anxiety   . Asthma   . Cancer ([place])    melanoma 2003  . Depression   . Hypertension   . Obesity (BMI 30-39.9)   . Vaginal delivery    X 2   Past Surgical History:  Procedure Laterality Date  . LEEP  1995  . SKIN CANCER EXCISION     melanoma   Family History  Problem Relation Age of Onset  . Hyperlipidemia Mother   . Hypertension Mother   . Diabetes Mother   . Breast cancer Mother 66  . Cancer Father        lung  . Depression Father   . Hyperlipidemia Sister   . Hypertension Sister   . Migraines Sister   . Hypertension Brother   . Hyperlipidemia Brother   . Migraines Brother   . Cancer Maternal Grandmother        brain  . Depression Maternal Grandfather   . Heart disease Maternal Grandfather   . Cancer Paternal Grandfather        unknown   Social History   Socioeconomic History  . Marital  status: Married    Spouse name: Not on file  . Number of children: Not on file  . Years of education: Not on file  . Highest education level: Not on file  Social Needs  . Financial resource strain: Not on file  . Food insecurity - worry: Not on file  . Food insecurity - inability: Not on file  . Transportation needs - medical: Not on file  . Transportation needs - non-medical: Not on file  Occupational History  . Not on file  Tobacco Use  . Smoking status: Former Smoker    Last attempt to quit: 03/09/2008    Years since quitting: 9.1  . Smokeless tobacco: Never Used  Substance and Sexual Activity  . Alcohol use: No  . Drug use: No  . Sexual activity: Yes  Other Topics Concern  . Not on file  Social History Narrative   Married 25 years.        2 children by 1st husband.       Disabled. Not working.       Current Meds  Medication Sig  .  ACETAMINOPHEN PO Take by mouth as needed.    Marland Kitchen albuterol (PROVENTIL HFA;VENTOLIN HFA) 108 (90 Base) MCG/ACT inhaler Inhale 2 puffs into the lungs every 6 (six) hours as needed.  . B Complex Vitamins (BL VITAMIN B COMPLEX PO) Place 5,000 Film under the tongue daily.  . cholecalciferol (VITAMIN D) 400 units TABS tablet Take 400 Units by mouth.  . clorazepate (TRANXENE) 7.5 MG tablet Take 7.5 mg by mouth 3 (three) times daily.  Marland Kitchen EPINEPHrine (EPIPEN) 0.3 mg/0.3 mL DEVI Inject 0.3 mLs (0.3 mg total) into the muscle once.  . fluticasone (FLONASE) 50 MCG/ACT nasal spray Place 2 sprays into both nostrils daily.  Marland Kitchen loratadine (CLARITIN) 10 MG tablet TAKE ONE (1) TABLET BY MOUTH EVERY DAY  . Multiple Vitamin (MULTIVITAMIN) tablet Take 1 tablet by mouth daily.  Marland Kitchen nystatin (MYCOSTATIN) powder Apply topically 2 (two) times daily.  Marland Kitchen omeprazole (PRILOSEC) 20 MG capsule TAKE 1 CAPSULE DAILY.  Marland Kitchen polyethylene glycol powder (GLYCOLAX/MIRALAX) powder Take 17 g by mouth daily. PRN. May titrate to once every 3 days for regular BM  . propranolol (INDERAL) 20 MG tablet Take 1 tablet (20 mg total) by mouth 3 (three) times daily.  . psyllium (METAMUCIL) 58.6 % powder Take 1 packet by mouth daily.  . sertraline (ZOLOFT) 100 MG tablet TAKE 1+1/2 TABLETS BY MOUTH DAILY AS DIRECTED  . Spacer/Aero Chamber Mouthpiece MISC 1 puff by Does not apply route as needed.   Allergies  Allergen Reactions  . Pork Allergy Swelling    Other reaction(s): OTHER  . Aciphex [Rabeprazole Sodium]   . Antivert [Meclizine Hcl]   . Buspar [Buspirone Hcl]   . Cat Hair Extract     Other reaction(s): SHORTNESS OF BREATH  . Chlorpheniramine-Pseudoeph   . Chocolate Flavor   . Codeine   . Cymbalta [Duloxetine Hcl]   . Effexor Xr [Venlafaxine Hydrochloride]   . Epinephrine   . Erythromycin   . Escitalopram Oxalate   . Lamictal [Lamotrigine]   . Levofloxacin Swelling    Levofloxacin  . Lipitor [Atorvastatin]     Multiple symptoms  .  Mometasone Furoate   . Mometasone Furoate   . Neurontin [Gabapentin]   . Other     Other reaction(s): HIVES Other reaction(s): HIVES Other reaction(s): HIVES Other reaction(s): HIVES   . Prednisone   . Sudafed [Pseudoephedrine Hcl]   . Sulfa Antibiotics   .  Valium   . Wellbutrin [Bupropion Hcl]   . Xanax Xr [Alprazolam]   . Alfalfa Rash  . Tape Rash   No results found for this or any previous visit (from the past 2160 hour(s)). Objective  Body mass index is 37.31 kg/m. Wt Readings from Last 3 Encounters:  04/20/17 204 lb (92.5 kg)  08/20/16 206 lb (93.4 kg)  06/24/16 206 lb 1.9 oz (93.5 kg)   Temp Readings from Last 3 Encounters:  04/20/17 98.3 F (36.8 C) (Oral)  08/20/16 97.9 F (36.6 C) (Oral)  06/24/16 97.9 F (36.6 C) (Oral)   BP Readings from Last 3 Encounters:  04/20/17 110/78  08/20/16 124/86  06/24/16 118/80   Pulse Readings from Last 3 Encounters:  04/20/17 68  08/20/16 67  06/24/16 64   Pulse oximetry on room air is 96% Physical Exam  Constitutional: She is oriented to person, place, and time and well-developed, well-nourished, and in no distress. Vital signs are normal.  HENT:  Head: Normocephalic and atraumatic.  Mouth/Throat: Oropharynx is clear and moist and mucous membranes are normal. She has dentures.  Eyes: Conjunctivae are normal. Pupils are equal, round, and reactive to light.  Cardiovascular: Normal rate, regular rhythm and normal heart sounds.  No murmur heard. Pulmonary/Chest: Effort normal.  Abdominal: Soft. Bowel sounds are normal. There is no tenderness.  Genitourinary: Uterus normal, right adnexa normal, left adnexa normal and vulva normal. Rectal exam shows external hemorrhoid. Cervix exhibits no motion tenderness. Thin and vaginal discharge found.  Genitourinary Comments: ? Cervical polyp vs GU prolapse  Visualized cervical os with friable area from 12 to 3 oclock  Musculoskeletal:       Lumbar back: She exhibits tenderness.    +straight leg test b/l   Neurological: She is alert and oriented to person, place, and time. Gait normal. Gait normal.  Skin: Skin is warm and dry.     Psychiatric: Mood, memory, affect and judgment normal.  Nursing note and vitals reviewed.  Assessment   1. Low back pain with radiculopathy b/l  2. Diarrhea/constipation need for colonoscopy and food sensitivity/allergy testing  3. Irregular menses abnormal pap today ? Cervical polpy vs GU prolapse with h/o LEEP 4. Ananphylactic rx history/h/o multiple allergies  5. Hm  Plan   1.  Xray low back Hold PT for now chiropractor is helping  Advised to limit use NSAIDS  If needed will do MRI lumbar   2. Refer to GI screening colonoscopy and food allergy/sensitivity testing Dr. Patnaude Norris   3. Refer back to Dr. Vikki Ports [name]  H/o abnormal pap and LEEP pap performed today ? If cervical polyp vs prolapse and cervix friable on exam   Also having irregular menses for OB/GYN to w/u   4. Refill epipen   5.  Flu had 2018, Tdap had 12/1/4  Need to disc shingrix in future   mammo sch'ed next Monday  Referred colonoscopy Dr. Haider Norris  Pap today declines STD testing per pt been with only 3 people and married to 2/3 of them   Check labs today CMET, CBC, UA, TSH, T4, lipid, hep B status, declines hep C.   She f/u dermatology q1 year h/o MM right foot     Provider: Dr. Olivia Mackie McLean-Scocuzza

## 2017-04-21 ENCOUNTER — Encounter: Payer: Self-pay | Admitting: Internal Medicine

## 2017-04-21 LAB — HEPATITIS B SURFACE ANTIBODY, QUANTITATIVE: Hepatitis B-Post: <5 m[IU]/mL — ABNORMAL LOW (ref >=10)

## 2017-04-21 LAB — HEPATITIS B CORE ANTIBODY, TOTAL: Hep B Core Total Ab: NONREACTIVE

## 2017-04-21 LAB — CERVICOVAGINAL ANCILLARY ONLY: HPV: NOT DETECTED

## 2017-04-21 LAB — HEPATITIS B SURFACE ANTIGEN: HEP B S AG: NONREACTIVE

## 2017-04-22 ENCOUNTER — Other Ambulatory Visit: Payer: Self-pay | Admitting: Internal Medicine

## 2017-04-22 DIAGNOSIS — E785 Hyperlipidemia, unspecified: Secondary | ICD-10-CM

## 2017-04-22 MED ORDER — EZETIMIBE 10 MG PO TABS: 10.0000 mg | ORAL_TABLET | Freq: Every day | ORAL | 0 refills | Status: DC

## 2017-04-23 ENCOUNTER — Telehealth: Payer: Self-pay

## 2017-04-23 LAB — CYTOLOGY - PAP: DIAGNOSIS: NEGATIVE

## 2017-04-26 ENCOUNTER — Inpatient Hospital Stay: Admission: RE | Admit: 2017-04-26 | Payer: Medicare Other | Source: Ambulatory Visit

## 2017-04-29 ENCOUNTER — Ambulatory Visit
Admission: RE | Admit: 2017-04-29 | Discharge: 2017-04-29 | Disposition: A | Discharge status: Home / Self Care | Payer: Medicare HMO | Source: Ambulatory Visit | Attending: Adult Health Nurse Practitioner | Admitting: Adult Health Nurse Practitioner

## 2017-04-29 DIAGNOSIS — Z1231 Encounter for screening mammogram for malignant neoplasm of breast: Secondary | ICD-10-CM | POA: Diagnosis not present

## 2017-05-06 DIAGNOSIS — L905 Scar conditions and fibrosis of skin: Secondary | ICD-10-CM | POA: Diagnosis not present

## 2017-05-06 DIAGNOSIS — L821 Other seborrheic keratosis: Secondary | ICD-10-CM | POA: Diagnosis not present

## 2017-05-06 DIAGNOSIS — L814 Other melanin hyperpigmentation: Secondary | ICD-10-CM | POA: Diagnosis not present

## 2017-05-06 DIAGNOSIS — Z85820 Personal history of malignant melanoma of skin: Secondary | ICD-10-CM | POA: Diagnosis not present

## 2017-05-06 DIAGNOSIS — D229 Melanocytic nevi, unspecified: Secondary | ICD-10-CM | POA: Diagnosis not present

## 2017-05-06 DIAGNOSIS — L219 Seborrheic dermatitis, unspecified: Secondary | ICD-10-CM | POA: Diagnosis not present

## 2017-05-06 DIAGNOSIS — L304 Erythema intertrigo: Secondary | ICD-10-CM | POA: Diagnosis not present

## 2017-05-06 DIAGNOSIS — D1801 Hemangioma of skin and subcutaneous tissue: Secondary | ICD-10-CM | POA: Diagnosis not present

## 2017-05-12 ENCOUNTER — Ambulatory Visit: Payer: Medicare HMO | Admitting: Gastroenterology

## 2017-05-17 ENCOUNTER — Telehealth: Payer: Self-pay | Admitting: Family

## 2017-05-17 DIAGNOSIS — R05 Cough: Secondary | ICD-10-CM | POA: Diagnosis not present

## 2017-05-17 DIAGNOSIS — J9809 Other diseases of bronchus, not elsewhere classified: Secondary | ICD-10-CM | POA: Diagnosis not present

## 2017-05-17 DIAGNOSIS — J40 Bronchitis, not specified as acute or chronic: Secondary | ICD-10-CM | POA: Diagnosis not present

## 2017-05-17 NOTE — Telephone Encounter (Signed)
Copied from [id number]. Topic: Quick Communication - See Telephone Encounter >> May 17, 2017  9:44 AM Cleaster Corin, NT wrote: CRM for notification. See Telephone encounter for:   05/17/17. Pt. Calling to see if she can get something else called in. No fever pt. Using Flonase and inhaler pt. Would like something stronger than amoxicillin ([facility]) let pt. Know that she possibility needs to be seen or contact provider that prescribed rx. Pt. Michela Pitcher she will go to urgent care today being that no appts. Are available today  [facility] [address], [place] - [place] [address] [place] [address] Phone: [phone] Fax: [phone]

## 2017-05-27 ENCOUNTER — Other Ambulatory Visit: Payer: Self-pay

## 2017-05-27 ENCOUNTER — Encounter: Payer: Self-pay | Admitting: Gastroenterology

## 2017-05-27 ENCOUNTER — Ambulatory Visit: Payer: Medicare HMO | Admitting: Gastroenterology

## 2017-05-27 VITALS — BP 129/86 | HR 78 | Ht 62.0 in | Wt 209.2 lb

## 2017-05-27 DIAGNOSIS — K5900 Constipation, unspecified: Secondary | ICD-10-CM | POA: Diagnosis not present

## 2017-05-27 DIAGNOSIS — Z1211 Encounter for screening for malignant neoplasm of colon: Secondary | ICD-10-CM

## 2017-05-27 NOTE — Patient Instructions (Signed)
Take Miralax 2 doses a day every morning. If you dont start having soft Bowel movements every 2-3 days after 2-3 weeks, add Metamucil every evening after 2-3 weeks.

## 2017-05-28 NOTE — Progress Notes (Signed)
Vonda Antigua [address]  [place]  [place], [place] [address]  Main: [phone]  Fax: [phone]   Gastroenterology Consultation  Referring Provider:     McLean-Scocuzza, Olivia Mackie * Primary Care Physician:  Burnard Hawthorne, FNP Primary Gastroenterologist:  Dr. Vonda Antigua Reason for Consultation:    Constipation        HPI:   Brittney Ruiz is a 52 y.o. y/o female referred for consultation & management  by Dr. Vidal Schwalbe, Yvetta Coder, FNP.  Patient reports history of chronic constipation for 1-2 years, and goes days without a bowel movement.  Reports hard stool, no blood in bowel movements.  Has been taking an alternating regimen of MiraLAX, Metamucil, Peri-Colace and an herbal product.  This does not lead to soft bowel movements on a regular basis.  Reports abdominal cramping due to this as well.  No previous history of colonoscopies. No dysphagia.  No nausea vomiting.  No weight loss.  No family history of colon cancer.  Past Medical History:  Diagnosis Date  . Abnormal Pap smear of cervix    h/o LEEP  . Allergy   . Anxiety   . Asthma   . Cancer ([place])    melanoma 2003  . Constipation   . Depression   . Diarrhea   . Hypertension   . Low back pain   . Melanoma ([place])    right foot s/p lymph node removed right groin  . Obesity (BMI 30-39.9)   . Vaginal delivery    X 2    Past Surgical History:  Procedure Laterality Date  . LEEP  1995  . LEEP    . SKIN CANCER EXCISION     melanoma    Prior to Admission medications   Medication Sig Start Date End Date Taking? Authorizing Provider  ACETAMINOPHEN PO Take by mouth as needed.     Yes [provider]  albuterol (PROVENTIL HFA;VENTOLIN HFA) 108 (90 Base) MCG/ACT inhaler Inhale 2 puffs into the lungs every 6 (six) hours as needed. 05/28/16  Yes Burnard Hawthorne, FNP  aspirin EC 81 MG tablet Take 81 mg by mouth daily.   Yes [provider]  B Complex Vitamins (BL VITAMIN B COMPLEX PO)  Place 5,000 Film under the tongue daily. 04/13/17  Yes [provider]  cholecalciferol (VITAMIN D) 400 units TABS tablet Take 1,000 Units by mouth.    Yes [provider]  clorazepate (TRANXENE) 7.5 MG tablet Take 7.5 mg by mouth 3 (three) times daily.   Yes [provider]  docusate sodium (COLACE) 100 MG capsule Take 100 mg by mouth daily. 01/24/07  Yes [provider]  EPINEPHrine (EPIPEN) 0.3 mg/0.3 mL DEVI Inject 0.3 mLs (0.3 mg total) into the muscle once. 05/31/12  Yes Rey, Latina Craver, NP  ezetimibe (ZETIA) 10 MG tablet Take 1 tablet (10 mg total) by mouth daily. 04/22/17  Yes McLean-Scocuzza, Nino Glow, MD  fluticasone (FLONASE) 50 MCG/ACT nasal spray Place 2 sprays into both nostrils daily. 10/02/15  Yes Jackolyn Confer, MD  fluticasone (FLOVENT HFA) 110 MCG/ACT inhaler Inhale 1 puff into the lungs 2 (two) times daily.   Yes [provider]  loratadine (CLARITIN) 10 MG tablet TAKE ONE (1) TABLET BY MOUTH EVERY DAY 01/07/15  Yes Doss, Velora Heckler, RN  Multiple Vitamin (MULTIVITAMIN) tablet Take 1 tablet by mouth daily.   Yes [provider]  Omega-3 Fatty Acids (FISH OIL) 1000 MG CAPS Take 1 capsule by mouth  daily.   Yes [provider]  omeprazole (PRILOSEC) 20 MG capsule TAKE 1 CAPSULE DAILY. 04/10/16  Yes Arnett, Yvetta Coder, FNP  polyethylene glycol powder (GLYCOLAX/MIRALAX) powder Take 17 g by mouth daily. PRN. May titrate to once every 3 days for regular BM 11/25/16  Yes Arnett, Yvetta Coder, FNP  propranolol (INDERAL) 20 MG tablet Take 1 tablet (20 mg total) by mouth 3 (three) times daily. 07/16/15  Yes Jackolyn Confer, MD  psyllium (METAMUCIL) 58.6 % powder Take 1 packet by mouth daily.   Yes [provider]  sertraline (ZOLOFT) 100 MG tablet TAKE 1+1/2 TABLETS BY MOUTH DAILY AS DIRECTED 06/14/14  Yes Jackolyn Confer, MD  Spacer/Aero Chamber Mouthpiece MISC 1 puff by Does not apply route as needed. 03/27/11  Yes Jackolyn Confer, MD  LAXATIVE 5 MG EC tablet  04/13/17   [provider]  nystatin (MYCOSTATIN) powder Apply topically 2 (two) times daily. Patient not taking: Reported on 05/27/2017 10/17/14   Rubbie Battiest, RN    Family History  Problem Relation Age of Onset  . Hyperlipidemia Mother   . Hypertension Mother   . Diabetes Mother   . Breast cancer Mother 108  . Cancer Father        lung  . Depression Father   . Hyperlipidemia Sister   . Hypertension Sister   . Migraines Sister   . Hypertension Brother   . Hyperlipidemia Brother   . Migraines Brother   . Cancer Maternal Grandmother        brain  . Depression Maternal Grandfather   . Heart disease Maternal Grandfather   . Cancer Paternal Grandfather        unknown     Social History   Tobacco Use  . Smoking status: Former Smoker    Last attempt to quit: 03/09/2008    Years since quitting: 9.2  . Smokeless tobacco: Never Used  Substance Use Topics  . Alcohol use: No  . Drug use: No    Allergies as of 05/27/2017 - Review Complete 05/27/2017  Allergen Reaction Noted  . Pork allergy Swelling 10/11/2012  . Aciphex [rabeprazole sodium]  03/10/2011  . Antivert [meclizine hcl]  03/10/2011  . Buspar [buspirone hcl]  03/10/2011  . Cat hair extract  10/11/2012  . Chlorpheniramine-pseudoeph  03/10/2011  . Chocolate flavor  10/14/2012  . Codeine  03/10/2011  . Cymbalta [duloxetine hcl]  03/10/2011  . Effexor xr [venlafaxine hydrochloride]  03/10/2011  . Epinephrine  03/10/2011  . Erythromycin  03/10/2011  . Escitalopram oxalate  03/10/2011  . Lamictal [lamotrigine]  03/10/2011  . Levofloxacin Swelling 04/03/2011  . Lipitor [atorvastatin]  08/09/2015  . Mometasone furoate  03/10/2011  . Mometasone furoate  03/10/2011  . Neurontin [gabapentin]  03/10/2011  . Other  10/11/2012  . Prednisone  03/10/2011  . Sudafed [pseudoephedrine hcl]  03/10/2011  . Sulfa antibiotics  03/10/2011  . Valium  03/10/2011  . Wellbutrin  [bupropion hcl]  03/10/2011  . Xanax xr [alprazolam]  03/27/2011  . Alfalfa Rash 10/15/2012  . Tape Rash 03/10/2011    Review of Systems:    All systems reviewed and negative except where noted in HPI.   Physical Exam:  BP 129/86   Pulse 78   Ht 5\' 2"  (1.575 m)   Wt 209 lb 3.2 oz (94.9 kg)   BMI 38.26 kg/m  No LMP recorded. Psych:  Alert and cooperative. Normal mood and affect. General:   Alert,  Well-developed, well-nourished,  pleasant and cooperative in NAD Head:  Normocephalic and atraumatic. Eyes:  Sclera clear, no icterus.   Conjunctiva pink. Ears:  Normal auditory acuity. Nose:  No deformity, discharge, or lesions. Mouth:  No deformity or lesions,oropharynx pink & moist. Neck:  Supple; no masses or thyromegaly. Lungs:  Respirations even and unlabored.  Clear throughout to auscultation.   No wheezes, crackles, or rhonchi. No acute distress. Heart:  Regular rate and rhythm; no murmurs, clicks, rubs, or gallops. Abdomen:  Normal bowel sounds.  No bruits.  Soft, non-tender and non-distended without masses, hepatosplenomegaly or hernias noted.  No guarding or rebound tenderness.    Msk:  Symmetrical without gross deformities. Good, equal movement & strength bilaterally. Pulses:  Normal pulses noted. Extremities:  No clubbing or edema.  No cyanosis. Neurologic:  Alert and oriented x3;  grossly normal neurologically. Skin:  Intact without significant lesions or rashes. No jaundice. Lymph Nodes:  No significant cervical adenopathy. Psych:  Alert and cooperative. Normal mood and affect.   Labs: CBC    Component Value Date/Time   WBC 7.6 04/20/2017 1158   RBC 5.07 04/20/2017 1158   HGB 14.9 04/20/2017 1158   HGB 15.4 10/06/2012 0434   HCT 44.0 04/20/2017 1158   HCT 44.3 10/06/2012 0434   PLT 206.0 04/20/2017 1158   PLT 246 10/06/2012 0434   MCV 86.8 04/20/2017 1158   MCV 86 10/06/2012 0434   MCH 29.8 10/06/2012 0434   MCH 29.2 08/12/2012 1548   MCHC 33.9 04/20/2017  1158   RDW 14.3 04/20/2017 1158   RDW 13.8 10/06/2012 0434   LYMPHSABS 2.3 04/20/2016 1423   MONOABS 0.4 04/20/2016 1423   EOSABS 0.2 04/20/2016 1423   BASOSABS 0.0 04/20/2016 1423   CMP     Component Value Date/Time   NA 138 04/20/2017 1158   NA 141 10/02/2014   NA 136 10/06/2012 0434   K 4.2 04/20/2017 1158   K 3.6 10/06/2012 0434   CL 102 04/20/2017 1158   CL 104 10/06/2012 0434   CO2 28 04/20/2017 1158   CO2 26 10/06/2012 0434   GLUCOSE 116 (H) 04/20/2017 1158   GLUCOSE 114 (H) 10/06/2012 0434   BUN 14 04/20/2017 1158   BUN 14 10/02/2014   BUN 7 10/06/2012 0434   CREATININE 0.65 04/20/2017 1158   CREATININE 0.73 08/09/2015 1609   CALCIUM 9.4 04/20/2017 1158   CALCIUM 9.3 10/06/2012 0434   PROT 7.0 04/20/2017 1158   PROT 7.3 10/06/2012 0434   ALBUMIN 4.6 04/20/2017 1158   ALBUMIN 4.2 10/06/2012 0434   AST 21 04/20/2017 1158   AST 17 10/06/2012 0434   ALT 23 04/20/2017 1158   ALT 27 10/06/2012 0434   ALKPHOS 57 04/20/2017 1158   ALKPHOS 64 10/06/2012 0434   BILITOT 0.7 04/20/2017 1158   BILITOT 0.6 10/06/2012 0434   GFRNONAA >60 10/06/2012 0434   GFRAA >60 10/06/2012 0434    Imaging Studies: Mm Screening Breast Tomo Bilateral  Result Date: 04/30/2017 CLINICAL DATA:  Screening. EXAM: 2D DIGITAL SCREENING BILATERAL MAMMOGRAM WITH CAD AND ADJUNCT TOMO COMPARISON:  Previous exam(s). ACR Breast Density Category b: There are scattered areas of fibroglandular density. FINDINGS: There are no findings suspicious for malignancy. Images were processed with CAD. IMPRESSION: No mammographic evidence of malignancy. A result letter of this screening mammogram will be mailed directly to the patient. RECOMMENDATION: Screening mammogram in one year. (Code:SM-B-01Y) BI-RADS CATEGORY  1: Negative. Electronically Signed   By: Marin Olp M.D.   On:  04/30/2017 08:46    Assessment and Plan:   Brittney Ruiz is a 52 y.o. y/o female has been referred for  constipation  Constipation Patient educated on high-fiber diet We will start patient on a regimen of MiraLAX 2 doses a day.  Patient asked to take this as 2 doses in the morning.  After about 1-2 weeks of this regimen, and if she was not having soft bowel movements every day or every other day on a regular basis, she was asked to start Metamucil in the evenings in addition to the 2 doses of MiraLAX during the morning. She was asked to hold on her Peri-Colace and her herbal product at this time She was asked to hydrate well and drink at least 8-10 glasses of water daily as well  Colorectal cancer screening She is due for average risk screening as she is 52 years old She is agreeable to scheduling this at this time I have discussed alternative options, risks & benefits,  which include, but are not limited to, bleeding, infection, perforation,respiratory complication & drug reaction.  The patient agrees with this plan & written consent will be obtained.     Dr Vonda Antigua

## 2017-06-08 ENCOUNTER — Telehealth: Payer: Self-pay

## 2017-06-08 NOTE — Telephone Encounter (Signed)
Statin intolerance been on lipitor 20 mg qd in the past and had side effects  Call and ask pt if been on other statins pravastatin, lovastatin, zocor/simvastatin, Crestor  Before filling out prior auth but I have not received this yet  [place]

## 2017-06-08 NOTE — Telephone Encounter (Signed)
Copied from [place]. Topic: Inquiry >> Jun 08, 2017  4:04 PM Ether Griffins B wrote: Reason for CRM: pt calling to let Dr. Aundra Dubin know that [facility] will be faxing over a form for her to fill out on ezetimbe due to it being a tier 3 and they are needing to know that the patient cant take the statan and has to be on the ezetimbe.   >> Jun 08, 2017  4:08 PM Ether Griffins B wrote: Also one will be coming for the Flovent [facility] 110 MCG/ACTUTION inhaler  >> Jun 08, 2017  4:09 PM Ether Griffins B wrote: Also pt wants to know when she needs to come in for a recheck on lab work

## 2017-06-10 NOTE — Telephone Encounter (Addendum)
Patient states she has only taken Lipitor and Crestor.  She felt achy and made joint pain. Patient wants to know if you want her to continue Flovent is tier 3 (increased cost)  No form has been sent over yet.  When does patient need to come back for follow up labs for Lipid panel?

## 2017-06-10 NOTE — Telephone Encounter (Signed)
We could try Advair or Symbicort?  not sure what is preferred on her plan She can call insurance co and ask which inhaler if preferred What does she want to do? I wanted her to come in 3 months after starting Zetia   Please fill out prior authorization for zetia given she has been on lipitor and crestor I think it will be approved  Thanks tSM

## 2017-06-16 DIAGNOSIS — N812 Incomplete uterovaginal prolapse: Secondary | ICD-10-CM | POA: Diagnosis not present

## 2017-06-16 DIAGNOSIS — N889 Noninflammatory disorder of cervix uteri, unspecified: Secondary | ICD-10-CM | POA: Diagnosis not present

## 2017-06-16 NOTE — Telephone Encounter (Signed)
error 

## 2017-06-16 NOTE — Telephone Encounter (Signed)
Patient will contact insurance company to see which inhaler is approved Advair vs Symbicort.   Advised her we sill have not received prior authorization form for Zetia.  She will contact [employer] and have them fax over.

## 2017-06-21 ENCOUNTER — Telehealth: Payer: Self-pay | Admitting: Gastroenterology

## 2017-06-21 NOTE — Telephone Encounter (Signed)
Please go ahead and schedule an EGD along with her colonoscopy that is already scheduled.

## 2017-06-25 ENCOUNTER — Ambulatory Visit (INDEPENDENT_AMBULATORY_CARE_PROVIDER_SITE_OTHER): Payer: Medicare HMO

## 2017-06-25 VITALS — BP 118/72 | HR 69 | Temp 98.4°F | Resp 15 | Ht 62.0 in | Wt 207.8 lb

## 2017-06-25 DIAGNOSIS — Z0000 Encounter for general adult medical examination without abnormal findings: Secondary | ICD-10-CM

## 2017-06-25 NOTE — Patient Instructions (Addendum)
  Brittney Ruiz , Thank you for taking time to come for your Medicare Wellness Visit. I appreciate your ongoing commitment to your health goals. Please review the following plan we discussed and let me know if I can assist you in the future.   Follow up with Mable Paris as needed.    Have a great day!  These are the goals we discussed: Goals    . Increase physical activity     Chair/standing/bed exercises  Walk for exercise as tolerated       This is a list of the screening recommended for you and due dates:  Health Maintenance  Topic Date Due  . Colon Cancer Screening  02/09/2017  . Mammogram  04/30/2019  . Pap Smear  04/20/2020  . Tetanus Vaccine  04/18/2023  . Flu Shot  Completed  . HIV Screening  Completed

## 2017-06-25 NOTE — Progress Notes (Signed)
Subjective:   Brittney Ruiz is a 52 y.o. female who presents for Medicare Annual (Subsequent) preventive examination.  Review of Systems:  No ROS.  Medicare Wellness Visit. Additional risk factors are reflected in the social history.  Cardiac Risk Factors include: hypertension;obesity (BMI >30kg/m2)     Objective:     Vitals: BP 118/72 (BP Location: Left Arm, Patient Position: Sitting, Cuff Size: Normal)   Pulse 69   Temp 98.4 F (36.9 C) (Oral)   Resp 15   Ht 5\' 2"  (1.575 m)   Wt 207 lb 12.8 oz (94.3 kg)   SpO2 97%   BMI 38.01 kg/m   Body mass index is 38.01 kg/m.  Advanced Directives 06/25/2017 06/24/2016  Does Patient Have a Medical Advance Directive? No No  Would patient like information on creating a medical advance directive? No - Patient declined Yes (MAU/Ambulatory/Procedural Areas - Information given)    Tobacco Social History   Tobacco Use  Smoking Status Former Smoker  . Last attempt to quit: 03/09/2008  . Years since quitting: 9.3  Smokeless Tobacco Never Used     Counseling given: Not Answered   Clinical Intake:  Pre-visit preparation completed: Yes  Pain : No/denies pain     Nutritional Status: BMI > 30  Obese Diabetes: No  How often do you need to have someone help you when you read instructions, pamphlets, or other written materials from your doctor or pharmacy?: 1 - Never  Interpreter Needed?: No     Past Medical History:  Diagnosis Date  . Abnormal Pap smear of cervix    h/o LEEP  . Allergy   . Anxiety   . Asthma   . Cancer ([place])    melanoma 2003  . Constipation   . Depression   . Diarrhea   . Hypertension   . Low back pain   . Melanoma ([place])    right foot s/p lymph node removed right groin  . Obesity (BMI 30-39.9)   . Vaginal delivery    X 2   Past Surgical History:  Procedure Laterality Date  . LEEP  1995  . LEEP    . SKIN CANCER EXCISION     melanoma   Family History  Problem Relation Age of Onset  .  Hyperlipidemia Mother   . Hypertension Mother   . Diabetes Mother   . Breast cancer Mother 43  . Cancer Father        lung  . Depression Father   . Hyperlipidemia Sister   . Hypertension Sister   . Migraines Sister   . Hypertension Brother   . Hyperlipidemia Brother   . Migraines Brother   . Cancer Maternal Grandmother        brain  . Depression Maternal Grandfather   . Heart disease Maternal Grandfather   . Cancer Paternal Grandfather        unknown   Social History   Socioeconomic History  . Marital status: Married    Spouse name: None  . Number of children: None  . Years of education: None  . Highest education level: None  Social Needs  . Financial resource strain: None  . Food insecurity - worry: None  . Food insecurity - inability: None  . Transportation needs - medical: None  . Transportation needs - non-medical: None  Occupational History  . None  Tobacco Use  . Smoking status: Former Smoker    Last attempt to quit: 03/09/2008    Years since quitting:  9.3  . Smokeless tobacco: Never Used  Substance and Sexual Activity  . Alcohol use: No  . Drug use: No  . Sexual activity: Yes  Other Topics Concern  . None  Social History Narrative   Married 25 years.        2 children by 1st husband.       Disabled. Not working.        Outpatient Encounter Medications as of 06/25/2017  Medication Sig  . ACETAMINOPHEN PO Take by mouth as needed.    Marland Kitchen albuterol (PROVENTIL HFA;VENTOLIN HFA) 108 (90 Base) MCG/ACT inhaler Inhale 2 puffs into the lungs every 6 (six) hours as needed.  Marland Kitchen aspirin EC 81 MG tablet Take 81 mg by mouth daily.  . B Complex Vitamins (BL VITAMIN B COMPLEX PO) Place 5,000 Film under the tongue daily.  . cholecalciferol (VITAMIN D) 400 units TABS tablet Take 1,000 Units by mouth.   . clorazepate (TRANXENE) 7.5 MG tablet Take 7.5 mg by mouth 3 (three) times daily.  Marland Kitchen EPINEPHrine (EPIPEN) 0.3 mg/0.3 mL DEVI Inject 0.3 mLs (0.3 mg total) into the  muscle once.  . ezetimibe (ZETIA) 10 MG tablet Take 1 tablet (10 mg total) by mouth daily.  . fluticasone (FLONASE) 50 MCG/ACT nasal spray Place 2 sprays into both nostrils daily.  . fluticasone (FLOVENT HFA) 110 MCG/ACT inhaler Inhale 1 puff into the lungs 2 (two) times daily.  Marland Kitchen loratadine (CLARITIN) 10 MG tablet TAKE ONE (1) TABLET BY MOUTH EVERY DAY  . Multiple Vitamin (MULTIVITAMIN) tablet Take 1 tablet by mouth daily.  Marland Kitchen nystatin (MYCOSTATIN) powder Apply topically 2 (two) times daily.  . Omega-3 Fatty Acids (FISH OIL) 1000 MG CAPS Take 1 capsule by mouth daily.  Marland Kitchen omeprazole (PRILOSEC) 20 MG capsule TAKE 1 CAPSULE DAILY.  Marland Kitchen polyethylene glycol powder (GLYCOLAX/MIRALAX) powder Take 1 Container by mouth once. Take 17g by mouth daily. PRN. May titrate to once every 3 days for regular BM  . propranolol (INDERAL) 20 MG tablet Take 1 tablet (20 mg total) by mouth 3 (three) times daily.  . psyllium (METAMUCIL) 58.6 % powder Take 1 packet by mouth daily.  . sertraline (ZOLOFT) 100 MG tablet TAKE 1+1/2 TABLETS BY MOUTH DAILY AS DIRECTED  . Spacer/Aero Chamber Mouthpiece MISC 1 puff by Does not apply route as needed.  . [DISCONTINUED] docusate sodium (COLACE) 100 MG capsule Take 100 mg by mouth daily.  . [DISCONTINUED] LAXATIVE 5 MG EC tablet   . [DISCONTINUED] polyethylene glycol (MIRALAX / GLYCOLAX) packet Take by mouth.  . [DISCONTINUED] polyethylene glycol powder (GLYCOLAX/MIRALAX) powder Take 17 g by mouth daily. PRN. May titrate to once every 3 days for regular BM   No facility-administered encounter medications on file as of 06/25/2017.     Activities of Daily Living In your present state of health, do you have any difficulty performing the following activities: 06/25/2017  Hearing? N  Vision? N  Difficulty concentrating or making decisions? N  Walking or climbing stairs? Y  Comment Unsteady gait when walking or moving too fast  Dressing or bathing? N  Doing errands, shopping? N    Preparing Food and eating ? N  Using the Toilet? N  In the past six months, have you accidently leaked urine? Y  Comment Followed by Urologist [name]). Managed with daily pad.  Do you have problems with loss of bowel control? N  Managing your Medications? N  Managing your Finances? N  Housekeeping or managing your Housekeeping? N  Some recent data might be hidden    Patient Care Team: Burnard Hawthorne, FNP as PCP - General (Family Medicine)    Assessment:   This is a routine wellness examination for Brittney Ruiz. The goal of the wellness visit is to assist the patient how to close the gaps in care and create a preventative care plan for the patient.   The roster of all physicians providing medical care to patient is listed in the Snapshot section of the chart.  Osteoporosis risk reviewed.    Safety issues reviewed; Smoke and carbon monoxide detectors in the home. No firearms or firearms locked in a safe within the home. Wears seatbelts when driving or riding with others. No violence in the home.  They do not have excessive sun exposure.  Discussed the need for sun protection: hats, long sleeves and the use of sunscreen if there is significant sun exposure.  Patient is alert, normal appearance, oriented to person/place/and time.  Correctly identified the president of the [place] and recalls of 3/3 words. Performs simple calculations and can read correct time from watch face.  Displays appropriate judgement.  No new identified risk were noted.  No failures at ADL's or IADL's.    BMI- discussed the importance of a healthy diet, water intake and the benefits of aerobic exercise. Educational material provided.   24 hour diet recall: Low cholesterol diet  Daily fluid intake: 0 cups of caffeine, 10 cups of water, 1 cups of juice/milk.  Dental- every 6 months. [employer] [facility].   Eye- Visual acuity not assessed per patient preference since they have regular follow up with the  ophthalmologist.  Wears corrective lenses.  Sleep patterns- Sleeps 6-8 hours at night.  Wakes feeling rested.  Colonoscopy scheduled.  Patient Concerns: None at this time. Follow up with PCP as needed.  Exercise Activities and Dietary recommendations Current Exercise Habits: The patient does not participate in regular exercise at present  Goals    . Increase physical activity     Chair/standing/bed exercises  Walk for exercise as tolerated       Fall Risk Fall Risk  06/25/2017 06/24/2016 04/20/2016  Falls in the past year? No No No   Depression Screen PHQ 2/9 Scores 06/25/2017 06/24/2016 04/20/2016 05/28/2015  PHQ - 2 Score 0 0 0 0     Cognitive Function MMSE - Mini Mental State Exam 06/25/2017  Orientation to time 5  Orientation to Place 5  Registration 3  Attention/ Calculation 5  Recall 3  Language- name 2 objects 2  Language- repeat 1  Language- follow 3 step command 3  Language- read & follow direction 1  Write a sentence 1  Copy design 1  Total score 30     6CIT Screen 06/24/2016  What Year? 0 points  What month? 0 points  What time? 0 points  Count back from 20 0 points  Months in reverse 0 points  Repeat phrase 0 points  Total Score 0    Immunization History  Administered Date(s) Administered  . Influenza Split 04/17/2013  . Influenza Whole 05/18/2008, 05/29/2011  . Influenza-Unspecified 03/28/2015, 02/16/2016, 03/05/2017  . Td 05/18/1997  . Tdap 07/16/2009, 04/17/2013    Screening Tests Health Maintenance  Topic Date Due  . COLONOSCOPY  02/09/2017  . MAMMOGRAM  04/30/2019  . PAP SMEAR  04/20/2020  . TETANUS/TDAP  04/18/2023  . INFLUENZA VACCINE  Completed  . HIV Screening  Completed      Plan:   End of  life planning; Advanced aging; Advanced directives discussed.  No HCPOA/Living Will.  Additional information declined at this time.  I have personally reviewed and noted the following in the patient's chart:   . Medical and social  history . Use of alcohol, tobacco or illicit drugs  . Current medications and supplements . Functional ability and status . Nutritional status . Physical activity . Advanced directives . List of other physicians . Hospitalizations, surgeries, and ER visits in previous 12 months . Vitals . Screenings to include cognitive, depression, and falls . Referrals and appointments  In addition, I have reviewed and discussed with patient certain preventive protocols, quality metrics, and best practice recommendations. A written personalized care plan for preventive services as well as general preventive health recommendations were provided to patient.     OBrien-Blaney, Tyrese Capriotti L, LPN  11/18/4035   I have reviewed the above information and agree with above.   Deborra Medina, MD

## 2017-06-28 ENCOUNTER — Telehealth: Payer: Self-pay | Admitting: Internal Medicine

## 2017-06-28 NOTE — Telephone Encounter (Signed)
Copied from [place] [id number]. Topic: Quick Communication - See Telephone Encounter >> Jun 28, 2017  4:31 PM Cleaster Corin, NT wrote: CRM for notification. See Telephone encounter for:   06/28/17. Pt. Calling to see if someone to give her a call about a colostomy appt. Questions about care. Pt. Can can be reached at [phone]

## 2017-06-28 NOTE — Telephone Encounter (Signed)
Please advise 

## 2017-06-29 ENCOUNTER — Telehealth: Payer: Self-pay | Admitting: Gastroenterology

## 2017-06-29 NOTE — Telephone Encounter (Signed)
Please call patient STAT!

## 2017-06-29 NOTE — Telephone Encounter (Signed)
I informed M.O. That pt needs to let us know what reason to do EGD.

## 2017-06-29 NOTE — Telephone Encounter (Signed)
Left message to contact office for reason for EGD before we can add this on.

## 2017-06-29 NOTE — Telephone Encounter (Signed)
Good morning, Dr. Bonna Gains,  Patient contacted office wanted [name] to add an EGD to her colonoscopy scheduled with you on 07/01/17 for GERD.  Would this be okay to do?  Please advise.  Thanks [employer]

## 2017-06-30 ENCOUNTER — Other Ambulatory Visit: Payer: Self-pay

## 2017-06-30 DIAGNOSIS — K219 Gastro-esophageal reflux disease without esophagitis: Secondary | ICD-10-CM

## 2017-06-30 NOTE — Telephone Encounter (Signed)
Left voicemail to call , looks like she has colonoscopy appointment tomorrow

## 2017-06-30 NOTE — Telephone Encounter (Addendum)
Pt states she has been told she has GERD by her PCP. She wakes up at night gasping for air, like choking and has horrible taste in her mouth.  Pt has had 2 sleep studies in past, about 8-10 yrs ago and results were inconclusive. She did have reaction to glue from the leads. Rash at site and discomfort.  Unable to add EGD on schedule  (too late) but will schedule this to add on. Pt is aware. If any problems she is to talk with Dr. Bonna Gains in am or someone may contact office.

## 2017-06-30 NOTE — Telephone Encounter (Signed)
See notes

## 2017-07-01 ENCOUNTER — Other Ambulatory Visit: Payer: Self-pay

## 2017-07-01 ENCOUNTER — Encounter
Admission: RE | Disposition: A | Discharge status: Home / Self Care | Payer: Self-pay | Source: Ambulatory Visit | Attending: Gastroenterology

## 2017-07-01 ENCOUNTER — Ambulatory Visit: Payer: Medicare HMO | Admitting: Anesthesiology

## 2017-07-01 ENCOUNTER — Encounter: Payer: Self-pay | Admitting: *Deleted

## 2017-07-01 ENCOUNTER — Ambulatory Visit
Admission: RE | Admit: 2017-07-01 | Discharge: 2017-07-01 | Disposition: A | Discharge status: Home / Self Care | Payer: Medicare HMO | Source: Ambulatory Visit | Attending: Gastroenterology | Admitting: Gastroenterology

## 2017-07-01 DIAGNOSIS — K5900 Constipation, unspecified: Secondary | ICD-10-CM | POA: Diagnosis not present

## 2017-07-01 DIAGNOSIS — Z85820 Personal history of malignant melanoma of skin: Secondary | ICD-10-CM | POA: Insufficient documentation

## 2017-07-01 DIAGNOSIS — K219 Gastro-esophageal reflux disease without esophagitis: Secondary | ICD-10-CM

## 2017-07-01 DIAGNOSIS — D127 Benign neoplasm of rectosigmoid junction: Secondary | ICD-10-CM | POA: Diagnosis not present

## 2017-07-01 DIAGNOSIS — E669 Obesity, unspecified: Secondary | ICD-10-CM | POA: Diagnosis not present

## 2017-07-01 DIAGNOSIS — K644 Residual hemorrhoidal skin tags: Secondary | ICD-10-CM | POA: Diagnosis not present

## 2017-07-01 DIAGNOSIS — K3189 Other diseases of stomach and duodenum: Secondary | ICD-10-CM | POA: Diagnosis not present

## 2017-07-01 DIAGNOSIS — K228 Other specified diseases of esophagus: Secondary | ICD-10-CM | POA: Diagnosis not present

## 2017-07-01 DIAGNOSIS — F329 Major depressive disorder, single episode, unspecified: Secondary | ICD-10-CM | POA: Diagnosis not present

## 2017-07-01 DIAGNOSIS — D125 Benign neoplasm of sigmoid colon: Secondary | ICD-10-CM | POA: Diagnosis not present

## 2017-07-01 DIAGNOSIS — K2289 Other specified disease of esophagus: Secondary | ICD-10-CM

## 2017-07-01 DIAGNOSIS — K648 Other hemorrhoids: Secondary | ICD-10-CM

## 2017-07-01 DIAGNOSIS — K5730 Diverticulosis of large intestine without perforation or abscess without bleeding: Secondary | ICD-10-CM | POA: Diagnosis not present

## 2017-07-01 DIAGNOSIS — K317 Polyp of stomach and duodenum: Secondary | ICD-10-CM

## 2017-07-01 DIAGNOSIS — J45909 Unspecified asthma, uncomplicated: Secondary | ICD-10-CM | POA: Diagnosis not present

## 2017-07-01 DIAGNOSIS — Z87891 Personal history of nicotine dependence: Secondary | ICD-10-CM | POA: Insufficient documentation

## 2017-07-01 DIAGNOSIS — Z6837 Body mass index (BMI) 37.0-37.9, adult: Secondary | ICD-10-CM | POA: Insufficient documentation

## 2017-07-01 DIAGNOSIS — K2960 Other gastritis without bleeding: Secondary | ICD-10-CM | POA: Diagnosis not present

## 2017-07-01 DIAGNOSIS — Z7951 Long term (current) use of inhaled steroids: Secondary | ICD-10-CM | POA: Diagnosis not present

## 2017-07-01 DIAGNOSIS — R12 Heartburn: Secondary | ICD-10-CM | POA: Diagnosis not present

## 2017-07-01 DIAGNOSIS — K2950 Unspecified chronic gastritis without bleeding: Secondary | ICD-10-CM | POA: Insufficient documentation

## 2017-07-01 DIAGNOSIS — Z1211 Encounter for screening for malignant neoplasm of colon: Secondary | ICD-10-CM | POA: Diagnosis not present

## 2017-07-01 DIAGNOSIS — I10 Essential (primary) hypertension: Secondary | ICD-10-CM | POA: Insufficient documentation

## 2017-07-01 DIAGNOSIS — F419 Anxiety disorder, unspecified: Secondary | ICD-10-CM | POA: Insufficient documentation

## 2017-07-01 DIAGNOSIS — K5790 Diverticulosis of intestine, part unspecified, without perforation or abscess without bleeding: Secondary | ICD-10-CM | POA: Diagnosis not present

## 2017-07-01 DIAGNOSIS — Z79899 Other long term (current) drug therapy: Secondary | ICD-10-CM | POA: Insufficient documentation

## 2017-07-01 DIAGNOSIS — K635 Polyp of colon: Secondary | ICD-10-CM | POA: Diagnosis not present

## 2017-07-01 DIAGNOSIS — K6389 Other specified diseases of intestine: Secondary | ICD-10-CM | POA: Diagnosis not present

## 2017-07-01 DIAGNOSIS — D131 Benign neoplasm of stomach: Secondary | ICD-10-CM | POA: Diagnosis not present

## 2017-07-01 HISTORY — PX: COLONOSCOPY WITH PROPOFOL: SHX5780

## 2017-07-01 HISTORY — PX: ESOPHAGOGASTRODUODENOSCOPY (EGD) WITH PROPOFOL: SHX5813

## 2017-07-01 LAB — POCT PREGNANCY, URINE: Preg Test, Ur: NEGATIVE

## 2017-07-01 SURGERY — COLONOSCOPY WITH PROPOFOL
Anesthesia: General

## 2017-07-01 SURGERY — ESOPHAGOGASTRODUODENOSCOPY (EGD) WITH PROPOFOL
Anesthesia: General

## 2017-07-01 MED ORDER — LIDOCAINE HCL (PF) 2 % IJ SOLN
INTRAMUSCULAR | Status: AC
  Filled 2017-07-01: qty 10

## 2017-07-01 MED ORDER — MIDAZOLAM HCL 2 MG/2ML IJ SOLN
INTRAMUSCULAR | Status: AC
  Filled 2017-07-01: qty 2

## 2017-07-01 MED ORDER — PROPOFOL 500 MG/50ML IV EMUL
INTRAVENOUS | Status: AC
  Filled 2017-07-01: qty 50

## 2017-07-01 MED ORDER — FENTANYL CITRATE (PF) 100 MCG/2ML IJ SOLN
INTRAMUSCULAR | Status: DC | PRN
  Administered 2017-07-01: 50 ug via INTRAVENOUS

## 2017-07-01 MED ORDER — PROPOFOL 10 MG/ML IV BOLUS
INTRAVENOUS | Status: AC
  Filled 2017-07-01: qty 20

## 2017-07-01 MED ORDER — PROPOFOL 10 MG/ML IV BOLUS
INTRAVENOUS | Status: DC | PRN
  Administered 2017-07-01 (×2): 20 mg via INTRAVENOUS
  Administered 2017-07-01: 47 mg via INTRAVENOUS
  Administered 2017-07-01: 20 mg via INTRAVENOUS

## 2017-07-01 MED ORDER — FENTANYL CITRATE (PF) 100 MCG/2ML IJ SOLN
INTRAMUSCULAR | Status: AC
  Filled 2017-07-01: qty 2

## 2017-07-01 MED ORDER — PROPOFOL 500 MG/50ML IV EMUL
INTRAVENOUS | Status: DC | PRN
  Administered 2017-07-01: 140 ug/kg/min via INTRAVENOUS

## 2017-07-01 MED ORDER — SODIUM CHLORIDE 0.9 % IV SOLN: INTRAVENOUS | Status: DC

## 2017-07-01 MED ORDER — MIDAZOLAM HCL 5 MG/5ML IJ SOLN
INTRAMUSCULAR | Status: DC | PRN
  Administered 2017-07-01: 1 mg via INTRAVENOUS

## 2017-07-01 MED ORDER — SODIUM CHLORIDE 0.9 % IV SOLN
INTRAVENOUS | Status: DC
  Administered 2017-07-01: 10:00:00 via INTRAVENOUS

## 2017-07-01 NOTE — Transfer of Care (Signed)
Immediate Anesthesia Transfer of Care Note  Patient: Brittney Ruiz  Procedure(s) Performed: COLONOSCOPY WITH PROPOFOL (N/A ) ESOPHAGOGASTRODUODENOSCOPY (EGD) WITH PROPOFOL (N/A )  Patient Location: PACU and Endoscopy Unit  Anesthesia Type:General  Level of Consciousness: sedated  Airway & Oxygen Therapy: Patient Spontanous Breathing  Post-op Assessment: Report given to RN  Post vital signs: stable  Last Vitals:  Vitals:   07/01/17 0943  BP: 135/82  Pulse: 66  Resp: (!) 22  Temp: (!) 35.8 C  SpO2: 100%    Last Pain:  Vitals:   07/01/17 0943  TempSrc: Tympanic      Patients Stated Pain Goal: 6 (91/66/06 0045)  Complications: No apparent anesthesia complications

## 2017-07-01 NOTE — Anesthesia Preprocedure Evaluation (Signed)
Anesthesia Evaluation  Patient identified by MRN, date of birth, ID band Patient awake    Reviewed: Allergy & Precautions, NPO status , Patient's Chart, lab work & pertinent test results  History of Anesthesia Complications Negative for: history of anesthetic complications  Airway Mallampati: II       Dental   Pulmonary asthma , neg COPD, former smoker,           Cardiovascular hypertension, Pt. on medications (-) Past MI and (-) CHF (-) dysrhythmias (-) Valvular Problems/Murmurs     Neuro/Psych neg Seizures Anxiety Depression    GI/Hepatic Neg liver ROS, GERD  Medicated,  Endo/Other  neg diabetes  Renal/GU negative Renal ROS     Musculoskeletal   Abdominal   Peds  Hematology   Anesthesia Other Findings   Reproductive/Obstetrics                             Anesthesia Physical Anesthesia Plan  ASA: III  Anesthesia Plan: General   Post-op Pain Management:    Induction: Intravenous  PONV Risk Score and Plan: 3 and TIVA, Propofol infusion and Midazolam  Airway Management Planned: Nasal Cannula  Additional Equipment:   Intra-op Plan:   Post-operative Plan:   Informed Consent: I have reviewed the patients History and Physical, chart, labs and discussed the procedure including the risks, benefits and alternatives for the proposed anesthesia with the patient or authorized representative who has indicated his/her understanding and acceptance.     Plan Discussed with:   Anesthesia Plan Comments:         Anesthesia Quick Evaluation

## 2017-07-01 NOTE — Anesthesia Postprocedure Evaluation (Signed)
Anesthesia Post Note  Patient: Brittney Ruiz  Procedure(s) Performed: COLONOSCOPY WITH PROPOFOL (N/A ) ESOPHAGOGASTRODUODENOSCOPY (EGD) WITH PROPOFOL (N/A )  Patient location during evaluation: Endoscopy Anesthesia Type: General Level of consciousness: awake and alert Pain management: pain level controlled Vital Signs Assessment: post-procedure vital signs reviewed and stable Respiratory status: spontaneous breathing and respiratory function stable Cardiovascular status: stable Anesthetic complications: no     Last Vitals:  Vitals:   07/01/17 0943  BP: 135/82  Pulse: 66  Resp: (!) 22  Temp: (!) 35.8 C  SpO2: 100%    Last Pain:  Vitals:   07/01/17 0943  TempSrc: Tympanic                 KEPHART,WILLIAM K

## 2017-07-01 NOTE — Anesthesia Post-op Follow-up Note (Signed)
Anesthesia QCDR form completed.        

## 2017-07-01 NOTE — Op Note (Signed)
[facility] Gastroenterology Patient Name: Brittney Ruiz Procedure Date: 07/01/2017 10:08 AM MRN: [id number] Account #: [id number] Date of Birth: 02/25/1966 Admit Type: Outpatient Age: 52 Room: [facility] ENDO ROOM 3 Gender: Female Note Status: Finalized Procedure:            Colonoscopy Indications:          Screening for colorectal malignant neoplasm, Incidental                        - Constipation Providers:            Varnita B. Bonna Gains MD, MD Referring MD:         Yvetta Coder. Arnett (Referring MD) Medicines:            Monitored Anesthesia Care Complications:        No immediate complications. Procedure:            Pre-Anesthesia Assessment:                       - ASA Grade Assessment: III - A patient with severe                        systemic disease.                       - Prior to the procedure, a History and Physical was                        performed, and patient medications, allergies and                        sensitivities were reviewed. The patient's tolerance of                        previous anesthesia was reviewed.                       - The risks and benefits of the procedure and the                        sedation options and risks were discussed with the                        patient. All questions were answered and informed                        consent was obtained.                       - Patient identification and proposed procedure were                        verified prior to the procedure by the physician, the                        nurse, the anesthesiologist, the anesthetist and the                        technician. The procedure was verified in the procedure  room.                       After obtaining informed consent, the colonoscope was                        passed under direct vision. Throughout the procedure,                        the patient's blood pressure, pulse, and oxygen   saturations were monitored continuously. The                        Colonoscope was introduced through the anus and                        advanced to the the cecum, identified by appendiceal                        orifice and ileocecal valve. The colonoscopy was                        performed with ease. The patient tolerated the                        procedure well. The quality of the bowel preparation                        was good except the ascending colon was fair. Findings:      A patchy area of mildly white patchy mucosa was found in the cecum.       Biopsies were taken with a cold forceps for histology.      Two sessile polyps were found in the sigmoid colon. The polyps were 2 to       4 mm in size. These polyps were removed with a cold biopsy forceps.       Resection and retrieval were complete.      Multiple small-mouthed diverticula were found in the sigmoid colon.      External and internal hemorrhoids were found during retroflexion and       during perianal exam.      The exam was otherwise without abnormality. Impression:           - White patchy mucosa in the cecum. Biopsied.                       - Two 2 to 4 mm polyps in the sigmoid colon, removed                        with a cold biopsy forceps. Resected and retrieved.                       - Diverticulosis in the sigmoid colon.                       - External and internal hemorrhoids.                       - The examination was otherwise normal. Recommendation:       - Discharge patient to home (with escort).                       -  Advance diet as tolerated.                       - Continue present medications.                       - Await pathology results.                       - Repeat colonoscopy in 5 years for surveillance due to                        fair prep in the right colon.                       - The findings and recommendations were discussed with                        the patient.                        - The findings and recommendations were discussed with                        the patient's family.                       - Return to primary care physician as previously                        scheduled.                       - High fiber diet. Procedure Code(s):    --- Professional ---                       [phone], Colonoscopy, flexible; with biopsy, single or                        multiple Diagnosis Code(s):    --- Professional ---                       Z12.11, Encounter for screening for malignant neoplasm                        of colon                       K64.8, Other hemorrhoids                       D12.5, Benign neoplasm of sigmoid colon                       K57.30, Diverticulosis of large intestine without                        perforation or abscess without bleeding CPT copyright 2016 American Medical Association. All rights reserved. The codes documented in this report are preliminary and upon coder review may  be revised to meet current compliance requirements.  Vonda Antigua, MD Margretta Sidle B. Bonna Gains MD, MD 07/01/2017 11:40:00 AM This report has been signed electronically. Number of Addenda: 0 Note Initiated On: 07/01/2017 10:08 AM Scope  Withdrawal Time: 0 hours 23 minutes 28 seconds  Total Procedure Duration: 0 hours 40 minutes 18 seconds  Estimated Blood Loss: Estimated blood loss: none.      [facility]

## 2017-07-01 NOTE — Op Note (Signed)
[facility] Patient Name: Brittney Ruiz Procedure Date: 07/01/2017 10:09 AM MRN: [id number] Account #: [id number] Date of Birth: 12/11/1965 Admit Type: Outpatient Age: 52 Room: [facility] ENDO ROOM 3 Gender: Female Note Status: Finalized Procedure:            Upper GI endoscopy Indications:          Heartburn Providers:            Kendrea Cerritos B. Maximino Greenland MD, MD Referring MD:         Lyn Records. Arnett (Referring MD) Medicines:            Monitored Anesthesia Care Complications:        No immediate complications. Procedure:            Pre-Anesthesia Assessment:                       - The risks and benefits of the procedure and the                        sedation options and risks were discussed with the                        patient. All questions were answered and informed                        consent was obtained.                       - Patient identification and proposed procedure were                        verified prior to the procedure.                       - ASA Grade Assessment: III - A patient with severe                        systemic disease.                       After obtaining informed consent, the endoscope was                        passed under direct vision. Throughout the procedure,                        the patient's blood pressure, pulse, and oxygen                        saturations were monitored continuously. The Endoscope                        was introduced through the mouth, and advanced to the                        second part of duodenum. The upper GI endoscopy was                        accomplished with ease. The patient tolerated the  procedure well. Findings:      The Z-line was irregular and was found 39 cm from the incisors.      Localized mild mucosal changes characterized by mild thickening were       found at the gastroesophageal junction. Biopsies were taken with a cold       forceps  for histology.      No other significant abnormalities were identified in a careful       examination of the esophagus.      Patchy mildly erythematous mucosa without bleeding was found in the       gastric antrum. Biopsies were obtained in the gastric body, at the       incisura and in the gastric antrum with cold forceps for histology.      Localized mild mucosal changes characterized by erythema were found on       the greater curvature of the gastric body. Biopsies were taken with a       cold forceps for histology.      Multiple 2 to 5 mm sessile polyps with no bleeding and no stigmata of       recent bleeding were found in the gastric fundus and in the gastric       body. The largest polyp was removed with a cold biopsy forceps.       Resection and retrieval were complete.      The examined duodenum was normal. Impression:           - Z-line irregular, 39 cm from the incisors.                       - Mild thickening mucosa in the esophagus. Biopsied.                       - Erythematous mucosa in the antrum.                       - Erythematous mucosa in the greater curvature of the                        gastric body. Biopsied.                       - Multiple gastric polyps. The largest polyp was                        Resected and retrieved. These likely represent benign                        fundic gland polyps associated with PPI use.                       - Normal examined duodenum.                       - Biopsies were obtained in the gastric body, at the                        incisura and in the gastric antrum. Recommendation:       - Await pathology results.                       - Follow an antireflux  regimen.                       - Continue present medications.                       - Discharge patient to home.                       - Advance diet as tolerated.                       - Return to my office as previously scheduled.                       - Return to  primary care physician as previously                        scheduled.                       - The findings and recommendations were discussed with                        the patient.                       - The findings and recommendations were discussed with                        the patient's family. Procedure Code(s):    --- Professional ---                       [phone], Esophagogastroduodenoscopy, flexible, transoral;                        with biopsy, single or multiple Diagnosis Code(s):    --- Professional ---                       K22.8, Other specified diseases of esophagus                       K31.89, Other diseases of stomach and duodenum                       K31.7, Polyp of stomach and duodenum                       R12, Heartburn CPT copyright 2016 American Medical Association. All rights reserved. The codes documented in this report are preliminary and upon coder review may  be revised to meet current compliance requirements.  Melodie Bouillon, MD Michel Bickers B. Maximino Greenland MD, MD 07/01/2017 10:51:14 AM This report has been signed electronically. Number of Addenda: 0 Note Initiated On: 07/01/2017 10:09 AM      [facility]

## 2017-07-01 NOTE — H&P (Signed)
Brittney Antigua, MD [address], [place], [place], [place], [address] [address], [place], [place], [place], [address] Phone: [phone]  Fax: [phone]  Primary Care Physician:  Burnard Hawthorne, FNP   Pre-Procedure History & Physical: HPI:  Brittney Ruiz is a 52 y.o. female is here for a colonoscopy and EGD.   Past Medical History:  Diagnosis Date  . Abnormal Pap smear of cervix    h/o LEEP  . Allergy   . Anxiety   . Asthma   . Cancer ([place])    melanoma 2003  . Constipation   . Depression   . Diarrhea   . Hypertension   . Low back pain   . Melanoma ([place])    right foot s/p lymph node removed right groin  . Obesity (BMI 30-39.9)   . Vaginal delivery    X 2    Past Surgical History:  Procedure Laterality Date  . LEEP  1995  . LEEP    . SKIN CANCER EXCISION     melanoma  . TONSILLECTOMY      Prior to Admission medications   Medication Sig Start Date End Date Taking? Authorizing Provider  ACETAMINOPHEN PO Take by mouth as needed.     Yes [provider]  albuterol (PROVENTIL HFA;VENTOLIN HFA) 108 (90 Base) MCG/ACT inhaler Inhale 2 puffs into the lungs every 6 (six) hours as needed. 05/28/16  Yes Burnard Hawthorne, FNP  aspirin EC 81 MG tablet Take 81 mg by mouth daily.   Yes [provider]  B Complex Vitamins (BL VITAMIN B COMPLEX PO) Place 5,000 Film under the tongue daily. 04/13/17  Yes [provider]  cholecalciferol (VITAMIN D) 400 units TABS tablet Take 1,000 Units by mouth.    Yes [provider]  clorazepate (TRANXENE) 7.5 MG tablet Take 7.5 mg by mouth 3 (three) times daily.   Yes [provider]  ezetimibe (ZETIA) 10 MG tablet Take 1 tablet (10 mg total) by mouth daily. 04/22/17  Yes McLean-Scocuzza, Nino Glow, MD  fluticasone (FLONASE) 50 MCG/ACT nasal spray Place 2 sprays into both nostrils daily. 10/02/15  Yes Jackolyn Confer, MD  fluticasone (FLOVENT HFA) 110 MCG/ACT inhaler Inhale 1 puff into  the lungs 2 (two) times daily.   Yes [provider]  loratadine (CLARITIN) 10 MG tablet TAKE ONE (1) TABLET BY MOUTH EVERY DAY 01/07/15  Yes Doss, Velora Heckler, RN  Multiple Vitamin (MULTIVITAMIN) tablet Take 1 tablet by mouth daily.   Yes [provider]  Omega-3 Fatty Acids (FISH OIL) 1000 MG CAPS Take 1 capsule by mouth daily.   Yes [provider]  omeprazole (PRILOSEC) 20 MG capsule TAKE 1 CAPSULE DAILY. 04/10/16  Yes Arnett, Yvetta Coder, FNP  polyethylene glycol powder (GLYCOLAX/MIRALAX) powder Take 1 Container by mouth once. Take 17g by mouth daily. PRN. May titrate to once every 3 days for regular BM   Yes [provider]  propranolol (INDERAL) 20 MG tablet Take 1 tablet (20 mg total) by mouth 3 (three) times daily. 07/16/15  Yes Jackolyn Confer, MD  psyllium (METAMUCIL) 58.6 % powder Take 1 packet by mouth daily.   Yes [provider]  sertraline (ZOLOFT) 100 MG tablet TAKE 1+1/2 TABLETS BY MOUTH DAILY AS DIRECTED 06/14/14  Yes Jackolyn Confer, MD  Spacer/Aero Chamber Mouthpiece MISC 1 puff by Does not apply route as needed. 03/27/11  Yes Jackolyn Confer, MD  EPINEPHrine (EPIPEN) 0.3 mg/0.3 mL DEVI Inject 0.3  mLs (0.3 mg total) into the muscle once. Patient not taking: Reported on 07/01/2017 05/31/12   Sherryl Barters, NP  nystatin (MYCOSTATIN) powder Apply topically 2 (two) times daily. Patient not taking: Reported on 07/01/2017 10/17/14   Rubbie Battiest, RN    Allergies as of 05/28/2017 - Review Complete 05/27/2017  Allergen Reaction Noted  . Pork allergy Swelling 10/11/2012  . Aciphex [rabeprazole sodium]  03/10/2011  . Antivert [meclizine hcl]  03/10/2011  . Buspar [buspirone hcl]  03/10/2011  . Cat hair extract  10/11/2012  . Chlorpheniramine-pseudoeph  03/10/2011  . Chocolate flavor  10/14/2012  . Codeine  03/10/2011  . Cymbalta [duloxetine hcl]  03/10/2011  . Effexor xr [venlafaxine hydrochloride]  03/10/2011  . Epinephrine   03/10/2011  . Erythromycin  03/10/2011  . Escitalopram oxalate  03/10/2011  . Lamictal [lamotrigine]  03/10/2011  . Levofloxacin Swelling 04/03/2011  . Lipitor [atorvastatin]  08/09/2015  . Mometasone furoate  03/10/2011  . Mometasone furoate  03/10/2011  . Neurontin [gabapentin]  03/10/2011  . Other  10/11/2012  . Prednisone  03/10/2011  . Sudafed [pseudoephedrine hcl]  03/10/2011  . Sulfa antibiotics  03/10/2011  . Valium  03/10/2011  . Wellbutrin [bupropion hcl]  03/10/2011  . Xanax xr [alprazolam]  03/27/2011  . Alfalfa Rash 10/15/2012  . Tape Rash 03/10/2011    Family History  Problem Relation Age of Onset  . Hyperlipidemia Mother   . Hypertension Mother   . Diabetes Mother   . Breast cancer Mother 16  . Cancer Father        lung  . Depression Father   . Hyperlipidemia Sister   . Hypertension Sister   . Migraines Sister   . Hypertension Brother   . Hyperlipidemia Brother   . Migraines Brother   . Cancer Maternal Grandmother        brain  . Depression Maternal Grandfather   . Heart disease Maternal Grandfather   . Cancer Paternal Grandfather        unknown    Social History   Socioeconomic History  . Marital status: Married    Spouse name: Not on file  . Number of children: Not on file  . Years of education: Not on file  . Highest education level: Not on file  Social Needs  . Financial resource strain: Not on file  . Food insecurity - worry: Not on file  . Food insecurity - inability: Not on file  . Transportation needs - medical: Not on file  . Transportation needs - non-medical: Not on file  Occupational History  . Not on file  Tobacco Use  . Smoking status: Former Smoker    Last attempt to quit: 03/09/2008    Years since quitting: 9.3  . Smokeless tobacco: Never Used  Substance and Sexual Activity  . Alcohol use: No  . Drug use: No  . Sexual activity: Yes  Other Topics Concern  . Not on file  Social History Narrative   Married 25 years.          2 children by 1st husband.       Disabled. Not working.        Review of Systems: See HPI, otherwise negative ROS  Physical Exam: BP 135/82   Pulse 66   Temp (!) 96.4 F (35.8 C) (Tympanic)   Resp (!) 22   Ht 5\' 2"  (1.575 m)   Wt 207 lb (93.9 kg)   LMP  (LMP Unknown) Comment: urine Preg negative  SpO2 100%   BMI 37.86 kg/m  General:   Alert,  pleasant and cooperative in NAD Head:  Normocephalic and atraumatic. Neck:  Supple; no masses or thyromegaly. Lungs:  Clear throughout to auscultation, normal respiratory effort.    Heart:  +S1, +S2, Regular rate and rhythm, No edema. Abdomen:  Soft, nontender and nondistended. Normal bowel sounds, without guarding, and without rebound.   Neurologic:  Alert and  oriented x4;  grossly normal neurologically.  Impression/Plan: Brittney Ruiz is here for a colonoscopy to be performed for average risk screening and EGD for Acid Reflux.  Risks, benefits, limitations, and alternatives regarding  colonoscopy have been reviewed with the patient.  Questions have been answered.  All parties agreeable.   Virgel Manifold, MD  07/01/2017, 10:07 AM

## 2017-07-05 LAB — SURGICAL PATHOLOGY

## 2017-07-08 ENCOUNTER — Encounter: Payer: Self-pay | Admitting: Gastroenterology

## 2017-07-08 ENCOUNTER — Telehealth: Payer: Self-pay | Admitting: Gastroenterology

## 2017-07-08 NOTE — Telephone Encounter (Signed)
Patient had an upper & lower endoscopy done on 07-01-17 & would like the results.Please call.

## 2017-07-08 NOTE — Telephone Encounter (Signed)
Patient didn't return call within 5 days see below message

## 2017-07-09 NOTE — Telephone Encounter (Signed)
Left message that result were in my chart with letter, since I was unable to talk with pt. If any questions please contact office.

## 2017-07-12 DIAGNOSIS — R631 Polydipsia: Secondary | ICD-10-CM | POA: Diagnosis not present

## 2017-07-12 DIAGNOSIS — N812 Incomplete uterovaginal prolapse: Secondary | ICD-10-CM | POA: Diagnosis not present

## 2017-07-12 DIAGNOSIS — N3946 Mixed incontinence: Secondary | ICD-10-CM | POA: Diagnosis not present

## 2017-07-12 DIAGNOSIS — N8111 Cystocele, midline: Secondary | ICD-10-CM | POA: Diagnosis not present

## 2017-07-12 DIAGNOSIS — R350 Frequency of micturition: Secondary | ICD-10-CM | POA: Diagnosis not present

## 2017-07-16 HISTORY — PX: BLADDER SUSPENSION: SHX72

## 2017-07-26 DIAGNOSIS — F324 Major depressive disorder, single episode, in partial remission: Secondary | ICD-10-CM | POA: Diagnosis not present

## 2017-07-26 DIAGNOSIS — F419 Anxiety disorder, unspecified: Secondary | ICD-10-CM | POA: Diagnosis not present

## 2017-07-30 ENCOUNTER — Other Ambulatory Visit: Payer: Self-pay | Admitting: Family

## 2017-07-30 DIAGNOSIS — E785 Hyperlipidemia, unspecified: Secondary | ICD-10-CM

## 2017-07-30 NOTE — Telephone Encounter (Signed)
LOV 04/20/17 with Dr. Aundra Dubin  Looks like the GI doctor maybe ordered this.   Pt asking why it was denied.   The Zetia.  [place], [place]

## 2017-07-30 NOTE — Telephone Encounter (Signed)
Copied from [place] [id number]. Topic: Quick Communication - Rx Refill/Question >> Jul 30, 2017  1:57 PM Oliver Pila B wrote: Pt would like to know why the Rx was denied  Medication:  ezetimibe (ZETIA) 10 MG tablet [[id number]]    Has the patient contacted their pharmacy? Yes.     (Agent: If no, request that the patient contact the pharmacy for the refill.)   Preferred Pharmacy (with phone number or street name): [facility]   Agent: Please be advised that RX refills may take up to 3 business days. We ask that you follow-up with your pharmacy.

## 2017-08-03 DIAGNOSIS — R51 Headache: Secondary | ICD-10-CM | POA: Diagnosis not present

## 2017-08-03 DIAGNOSIS — M5430 Sciatica, unspecified side: Secondary | ICD-10-CM | POA: Diagnosis not present

## 2017-08-03 DIAGNOSIS — M9903 Segmental and somatic dysfunction of lumbar region: Secondary | ICD-10-CM | POA: Diagnosis not present

## 2017-08-03 DIAGNOSIS — Z72820 Sleep deprivation: Secondary | ICD-10-CM | POA: Diagnosis not present

## 2017-08-03 DIAGNOSIS — M9905 Segmental and somatic dysfunction of pelvic region: Secondary | ICD-10-CM | POA: Diagnosis not present

## 2017-08-03 DIAGNOSIS — M9901 Segmental and somatic dysfunction of cervical region: Secondary | ICD-10-CM | POA: Diagnosis not present

## 2017-08-03 DIAGNOSIS — M722 Plantar fascial fibromatosis: Secondary | ICD-10-CM | POA: Diagnosis not present

## 2017-08-03 DIAGNOSIS — M9902 Segmental and somatic dysfunction of thoracic region: Secondary | ICD-10-CM | POA: Diagnosis not present

## 2017-08-03 DIAGNOSIS — M791 Myalgia, unspecified site: Secondary | ICD-10-CM | POA: Diagnosis not present

## 2017-08-13 NOTE — Telephone Encounter (Signed)
I am not showing where this was denied Dr Aundra Dubin rxd 04/23/17.   ? Ok to fill

## 2017-08-16 HISTORY — PX: PARTIAL HYSTERECTOMY: SHX80

## 2017-08-16 MED ORDER — EZETIMIBE 10 MG PO TABS: 10.0000 mg | ORAL_TABLET | Freq: Every day | ORAL | 1 refills | Status: DC

## 2017-08-16 NOTE — Telephone Encounter (Signed)
Let pt know I refilled  Please ensure she has a f/u with me so we can recheck lipid panel

## 2017-08-17 DIAGNOSIS — N3946 Mixed incontinence: Secondary | ICD-10-CM | POA: Diagnosis not present

## 2017-08-17 DIAGNOSIS — N814 Uterovaginal prolapse, unspecified: Secondary | ICD-10-CM | POA: Insufficient documentation

## 2017-08-20 NOTE — Telephone Encounter (Signed)
Spoke with patient states stopped Zetia due to myalgias .  Appointment scheduled with you for follow up.

## 2017-08-30 NOTE — Telephone Encounter (Signed)
Noted Will address in may

## 2017-08-30 NOTE — Addendum Note (Signed)
Addended by: Burnard Hawthorne on: 08/30/2017 03:40 PM   Modules accepted: Orders

## 2017-09-03 DIAGNOSIS — E668 Other obesity: Secondary | ICD-10-CM | POA: Diagnosis not present

## 2017-09-03 DIAGNOSIS — N3946 Mixed incontinence: Secondary | ICD-10-CM | POA: Diagnosis not present

## 2017-09-03 DIAGNOSIS — R631 Polydipsia: Secondary | ICD-10-CM | POA: Diagnosis not present

## 2017-09-03 DIAGNOSIS — D259 Leiomyoma of uterus, unspecified: Secondary | ICD-10-CM | POA: Diagnosis not present

## 2017-09-03 DIAGNOSIS — Z6839 Body mass index (BMI) 39.0-39.9, adult: Secondary | ICD-10-CM | POA: Diagnosis not present

## 2017-09-03 DIAGNOSIS — N8111 Cystocele, midline: Secondary | ICD-10-CM | POA: Diagnosis not present

## 2017-09-03 DIAGNOSIS — Z85820 Personal history of malignant melanoma of skin: Secondary | ICD-10-CM | POA: Diagnosis not present

## 2017-09-03 DIAGNOSIS — I10 Essential (primary) hypertension: Secondary | ICD-10-CM | POA: Diagnosis not present

## 2017-09-03 DIAGNOSIS — D251 Intramural leiomyoma of uterus: Secondary | ICD-10-CM | POA: Diagnosis not present

## 2017-09-03 DIAGNOSIS — N812 Incomplete uterovaginal prolapse: Secondary | ICD-10-CM | POA: Diagnosis not present

## 2017-09-03 DIAGNOSIS — D250 Submucous leiomyoma of uterus: Secondary | ICD-10-CM | POA: Diagnosis not present

## 2017-09-03 DIAGNOSIS — J45909 Unspecified asthma, uncomplicated: Secondary | ICD-10-CM | POA: Diagnosis not present

## 2017-09-03 DIAGNOSIS — K219 Gastro-esophageal reflux disease without esophagitis: Secondary | ICD-10-CM | POA: Diagnosis not present

## 2017-09-15 ENCOUNTER — Ambulatory Visit: Payer: Medicare HMO | Admitting: Family

## 2017-09-23 ENCOUNTER — Telehealth: Payer: Self-pay | Admitting: Gastroenterology

## 2017-09-23 NOTE — Telephone Encounter (Signed)
I spoke with pt. And informed her that I did not not see any notes regarding (allergy to gluten or soy) this specifically and gave pt minimal results such as they were negative for dysplasia and malignancy, and that  she needs to speak with MD regarding her findings and recommendations of the EGD/Colonoscopy.  Pt states she has had major surgery, on 09/10/17. She had pelvic prolapse and had a hysterectomy, and bladder surgery (sling). She has also developed a hemorrhoid since then.  I did advise pt that if she did not feel up to her appt to let us know and we would understand but she stated she is feeling better. Also pt states that the food she eats is not digested. It comes out like it goes in. Unable to take any digestive enzymes because she was told it would react to her anxiety medication as in the serotonin levels.

## 2017-09-23 NOTE — Telephone Encounter (Signed)
Pt is calling to fins out if the apt for 05/13 is to find out if  She is allergic to glutin or soy?

## 2017-09-27 ENCOUNTER — Encounter: Payer: Self-pay | Admitting: Gastroenterology

## 2017-09-27 ENCOUNTER — Ambulatory Visit: Payer: Medicare HMO | Admitting: Gastroenterology

## 2017-09-27 ENCOUNTER — Other Ambulatory Visit
Admission: RE | Admit: 2017-09-27 | Discharge: 2017-09-27 | Disposition: A | Discharge status: Home / Self Care | Payer: Medicare HMO | Source: Ambulatory Visit | Attending: Gastroenterology | Admitting: Gastroenterology

## 2017-09-27 ENCOUNTER — Other Ambulatory Visit: Payer: Self-pay

## 2017-09-27 VITALS — BP 117/74 | HR 76 | Temp 98.1°F | Ht 62.0 in | Wt 218.8 lb

## 2017-09-27 DIAGNOSIS — K219 Gastro-esophageal reflux disease without esophagitis: Secondary | ICD-10-CM

## 2017-09-27 DIAGNOSIS — R140 Abdominal distension (gaseous): Secondary | ICD-10-CM | POA: Diagnosis not present

## 2017-09-27 DIAGNOSIS — K5900 Constipation, unspecified: Secondary | ICD-10-CM

## 2017-09-27 NOTE — Patient Instructions (Signed)
Metamucil 2 x day Miralax in afternoon Bed wedge F/u 6 months   Gastroesophageal Reflux Disease, Adult Normally, food travels down the esophagus and stays in the stomach to be digested. If a person has gastroesophageal reflux disease (GERD), food and stomach acid move back up into the esophagus. When this happens, the esophagus becomes sore and swollen (inflamed). Over time, GERD can make small holes (ulcers) in the lining of the esophagus. Follow these instructions at home: Diet  Follow a diet as told by your doctor. You may need to avoid foods and drinks such as: ? Coffee and tea (with or without caffeine). ? Drinks that contain alcohol. ? Energy drinks and sports drinks. ? Carbonated drinks or sodas. ? Chocolate and cocoa. ? Peppermint and mint flavorings. ? Garlic and onions. ? Horseradish. ? Spicy and acidic foods, such as peppers, chili powder, curry powder, vinegar, hot sauces, and BBQ sauce. ? Citrus fruit juices and citrus fruits, such as oranges, lemons, and limes. ? Tomato-based foods, such as red sauce, chili, salsa, and pizza with red sauce. ? Fried and fatty foods, such as donuts, french fries, potato chips, and high-fat dressings. ? High-fat meats, such as hot dogs, rib eye steak, sausage, ham, and bacon. ? High-fat dairy items, such as whole milk, butter, and cream cheese.  Eat small meals often. Avoid eating large meals.  Avoid drinking large amounts of liquid with your meals.  Avoid eating meals during the 2-3 hours before bedtime.  Avoid lying down right after you eat.  Do not exercise right after you eat. General instructions  Pay attention to any changes in your symptoms.  Take over-the-counter and prescription medicines only as told by your doctor. Do not take aspirin, ibuprofen, or other NSAIDs unless your doctor says it is okay.  Do not use any tobacco products, including cigarettes, chewing tobacco, and e-cigarettes. If you need help quitting, ask  your doctor.  Wear loose clothes. Do not wear anything tight around your waist.  Raise (elevate) the head of your bed about 6 inches (15 cm).  Try to lower your stress. If you need help doing this, ask your doctor.  If you are overweight, lose an amount of weight that is healthy for you. Ask your doctor about a safe weight loss goal.  Keep all follow-up visits as told by your doctor. This is important. Contact a doctor if:  You have new symptoms.  You lose weight and you do not know why it is happening.  You have trouble swallowing, or it hurts to swallow.  You have wheezing or a cough that keeps happening.  Your symptoms do not get better with treatment.  You have a hoarse voice. Get help right away if:  You have pain in your arms, neck, jaw, teeth, or back.  You feel sweaty, dizzy, or light-headed.  You have chest pain or shortness of breath.  You throw up (vomit) and your throw up looks like blood or coffee grounds.  You pass out (faint).  Your poop (stool) is bloody or black.  You cannot swallow, drink, or eat. This information is not intended to replace advice given to you by your health care provider. Make sure you discuss any questions you have with your health care provider. Document Released: 10/21/2007 Document Revised: 10/10/2015 Document Reviewed: 08/29/2014 Elsevier Interactive Patient Education  [employer].

## 2017-09-27 NOTE — Progress Notes (Signed)
Vonda Antigua, MD [address]  [place]  [place], [place] [address]  Main: [phone]  Fax: [phone]   Primary Care Physician: Burnard Hawthorne, FNP  Primary Gastroenterologist:  Dr. Vonda Antigua  Chief Complaint  Patient presents with  . Follow-up    colonoscopy/EGD--eaten food not digested    HPI: Brittney Ruiz is a 52 y.o. female Patient has history of chronic constipation she was asked to start taking.  MiraLAX and Metamucil on last visit.  She states this helped for some time, but then she started having constipation again.  At this time she is taking Metamucil in the morning, and MiraLAX at night.  She states with this, she still has 1 to 2 days without bowel movements.  She reports hard stools and straining at times.  No weight loss.  Denies abdominal pain, but reports bloating.  Bloating is intermittent, not exacerbated with meals.  No diarrhea.  No dysphagia.  Heartburn well controlled with once daily PPI that she is on chronically.  She reports seeing multiple matter that she has eaten Kalo S in her stool.  And would also like to be tested for gluten, dairy, soy allergy.  Colonoscopy in February 2019 showed white patchy mucosa in the cecum that was biopsied.  2, 2 to 4 mm polyps in the sigmoid colon, completely removed.  Diverticulosis, external and internal hemorrhoids.  Pathology showed mild melanosis coli, and the polyps were focal lymphoid aggregate and hyperplastic changes.  EGD February 2019 showed thickening at the GE junction, biopsied, gastric erythema, gastric polyp. Biopsies of the GE junction nodule showed squamous mucosa with glycogen acanthosis suggestive of reflux fundic gland polyp,  Reactive gastritis, no H. Pylori.  Patient has also recently undergone vaginal hysterectomy, colporrhaphy.  Here for follow-up of constipation  Current Outpatient Medications  Medication Sig Dispense Refill  . ACETAMINOPHEN PO Take by mouth as needed.      Marland Kitchen  albuterol (PROVENTIL HFA;VENTOLIN HFA) 108 (90 Base) MCG/ACT inhaler Inhale 2 puffs into the lungs every 6 (six) hours as needed. 1 Inhaler 1  . aspirin EC 81 MG tablet Take 81 mg by mouth daily.    . B Complex Vitamins (BL VITAMIN B COMPLEX PO) Place 5,000 Film under the tongue daily.    . cholecalciferol (VITAMIN D) 400 units TABS tablet Take 1,000 Units by mouth.     . clorazepate (TRANXENE) 7.5 MG tablet Take 7.5 mg by mouth 3 (three) times daily.    Marland Kitchen EPINEPHrine (EPIPEN) 0.3 mg/0.3 mL DEVI Inject 0.3 mLs (0.3 mg total) into the muscle once. 1 Device 1  . fluticasone (FLONASE) 50 MCG/ACT nasal spray Place 2 sprays into both nostrils daily. 16 g 6  . fluticasone (FLOVENT HFA) 110 MCG/ACT inhaler Inhale 1 puff into the lungs 2 (two) times daily.    Marland Kitchen loratadine (CLARITIN) 10 MG tablet TAKE ONE (1) TABLET BY MOUTH EVERY DAY 90 tablet 3  . Multiple Vitamin (MULTIVITAMIN) tablet Take 1 tablet by mouth daily.    . Omega-3 Fatty Acids (FISH OIL) 1000 MG CAPS Take 1 capsule by mouth daily.    Marland Kitchen omeprazole (PRILOSEC) 20 MG capsule TAKE 1 CAPSULE DAILY. 90 capsule 0  . polyethylene glycol powder (MIRALAX) powder Take 1 Container by mouth daily.    . propranolol (INDERAL) 20 MG tablet Take 1 tablet (20 mg total) by mouth 3 (three) times daily. 90 tablet 3  . psyllium (METAMUCIL) 58.6 % powder Take 1 packet by mouth daily.    Marland Kitchen  sertraline (ZOLOFT) 100 MG tablet TAKE 1+1/2 TABLETS BY MOUTH DAILY AS DIRECTED 45 tablet 3  . Spacer/Aero Chamber Mouthpiece MISC 1 puff by Does not apply route as needed. 1 each 1  . nystatin (MYCOSTATIN) powder Apply topically 2 (two) times daily. (Patient not taking: Reported on 07/01/2017) 30 g 0   No current facility-administered medications for this visit.     Allergies as of 09/27/2017 - Review Complete 09/27/2017  Allergen Reaction Noted  . Pork allergy Swelling 10/11/2012  . Aciphex [rabeprazole sodium]  03/10/2011  . Antivert [meclizine hcl]  03/10/2011  . Buspar  [buspirone hcl]  03/10/2011  . Cat hair extract  10/11/2012  . Chlorpheniramine-pseudoeph  03/10/2011  . Chocolate flavor  10/14/2012  . Codeine  03/10/2011  . Crestor [rosuvastatin calcium]  06/10/2017  . Cymbalta [duloxetine hcl]  03/10/2011  . Effexor xr [venlafaxine hydrochloride]  03/10/2011  . Epinephrine  03/10/2011  . Erythromycin  03/10/2011  . Escitalopram oxalate  03/10/2011  . Lamictal [lamotrigine]  03/10/2011  . Levofloxacin Swelling 04/03/2011  . Lipitor [atorvastatin]  08/09/2015  . Mometasone furoate  03/10/2011  . Mometasone furoate  03/10/2011  . Neurontin [gabapentin]  03/10/2011  . Other  10/11/2012  . Prednisone  03/10/2011  . Sudafed [pseudoephedrine hcl]  03/10/2011  . Sulfa antibiotics  03/10/2011  . Valium  03/10/2011  . Wellbutrin [bupropion hcl]  03/10/2011  . Xanax xr [alprazolam]  03/27/2011  . Alfalfa Rash 10/15/2012  . Tape Rash 03/10/2011    ROS:  General: Negative for anorexia, weight loss, fever, chills, fatigue, weakness. ENT: Negative for hoarseness, difficulty swallowing , nasal congestion. CV: Negative for chest pain, angina, palpitations, dyspnea on exertion, peripheral edema.  Respiratory: Negative for dyspnea at rest, dyspnea on exertion, cough, sputum, wheezing.  GI: See history of present illness. GU:  Negative for dysuria, hematuria, urinary incontinence, urinary frequency, nocturnal urination.  Endo: Negative for unusual weight change.    Physical Examination:   BP 117/74   Pulse 76   Temp 98.1 F (36.7 C) (Oral)   Ht 5\' 2"  (1.575 m)   Wt 218 lb 12.8 oz (99.2 kg)   BMI 40.02 kg/m    General: Well-nourished, well-developed in no acute distress.  Eyes: No icterus. Conjunctivae pink. Mouth: Oropharyngeal mucosa moist and pink , no lesions erythema or exudate. Neck: Supple, Trachea midline Abdomen: Bowel sounds are normal, nontender, nondistended, no hepatosplenomegaly or masses, no abdominal bruits or hernia , no rebound  or guarding.   Extremities: No lower extremity edema. No clubbing or deformities. Neuro: Alert and oriented x 3.  Grossly intact. Skin: Warm and dry, no jaundice.   Psych: Alert and cooperative, normal mood and affect.   Labs: CMP     Component Value Date/Time   NA 138 04/20/2017 1158   NA 141 10/02/2014   NA 136 10/06/2012 0434   K 4.2 04/20/2017 1158   K 3.6 10/06/2012 0434   CL 102 04/20/2017 1158   CL 104 10/06/2012 0434   CO2 28 04/20/2017 1158   CO2 26 10/06/2012 0434   GLUCOSE 116 (H) 04/20/2017 1158   GLUCOSE 114 (H) 10/06/2012 0434   BUN 14 04/20/2017 1158   BUN 14 10/02/2014   BUN 7 10/06/2012 0434   CREATININE 0.65 04/20/2017 1158   CREATININE 0.73 08/09/2015 1609   CALCIUM 9.4 04/20/2017 1158   CALCIUM 9.3 10/06/2012 0434   PROT 7.0 04/20/2017 1158   PROT 7.3 10/06/2012 0434   ALBUMIN 4.6 04/20/2017 1158  ALBUMIN 4.2 10/06/2012 0434   AST 21 04/20/2017 1158   AST 17 10/06/2012 0434   ALT 23 04/20/2017 1158   ALT 27 10/06/2012 0434   ALKPHOS 57 04/20/2017 1158   ALKPHOS 64 10/06/2012 0434   BILITOT 0.7 04/20/2017 1158   BILITOT 0.6 10/06/2012 0434   GFRNONAA >60 10/06/2012 0434   GFRAA >60 10/06/2012 0434   Lab Results  Component Value Date   WBC 7.6 04/20/2017   HGB 14.9 04/20/2017   HCT 44.0 04/20/2017   MCV 86.8 04/20/2017   PLT 206.0 04/20/2017    Imaging Studies: No results found.  Assessment and Plan:   MARLIES LIGMAN is a 52 y.o. y/o female here for follow-up of constipation  Constipation Chronic started to eat a high-fiber diet, and this was encouraged I have asked her to start taking Metamucil twice a day, and take MiraLAX in the afternoon She has had recent surgeries, that are likely exacerbating her constipation as well, will does not add any medications at this time as she is recovering from her recent surgery Can consider IBS specific medications in the future If she does not have a bowel movement 2 to 3 days, I have asked her  to take oral Dulcolax at that time  Bloating She does not have any diarrhea with any other meals, and I doubt gluten sensitivity, wheat intolerance or lactose intolerance She states she does not eat much dairy, and has try to avoid any that she does not this has not altered her symptoms Given her question about gluten sensitivity, and bloating, can obtain celiac serology to rule out Constipation can also cause bloating, and have discussed this with her, and the above interventions We have given her a handout for FODMAP diet, and asked her to identify 1 or 2 items that she may be eating more of that are causing bloating, and asked her to keep a food diary Her primary care physician has recommended allergy testing for her, and they are planning and referring her to an allergist immunologist for the same. In the meantime, I have asked her to avoid lactose products to see if that helps her symptoms as well No alarm symptoms present  Acid reflux Well-controlled with once daily PPI Patient states without this medication, she has reflux breakthrough symptoms only occur 1-2 times a week Patient educated extensively on acid reflux lifestyle modification, including buying a bed wedge, not eating 3 hrs before bedtime, diet modifications, and handout given for the same.  Will not increase PPI at this time to prevent adverse effects, as breakthrough symptoms are only 1-2 times a week GE junction nodule was small and likely due to acid reflux. Can reevaluate with EGD in the future.  (Risks of PPI use were discussed with patient including bone loss, C. Diff diarrhea, pneumonia, infections, CKD, electrolyte abnormalities.  If clinically possible based on symptoms, goal would be to maintain patient on the lowest dose possible, or discontinue the medication with institution of acid reflux lifestyle modifications over time. Pt. Verbalizes understanding and chooses to continue the medication.)   Dr Vonda Antigua

## 2017-09-28 ENCOUNTER — Ambulatory Visit: Payer: Medicare HMO | Admitting: Gastroenterology

## 2017-09-29 LAB — TISSUE TRANSGLUTAMINASE, IGG: Tissue Transglut Ab: <2 U/mL (ref 0–5)

## 2017-09-29 LAB — CELIAC DISEASE PANEL
Endomysial Ab, IgA: NEGATIVE
IgA: 79 mg/dL — ABNORMAL LOW (ref 87–352)

## 2017-10-01 ENCOUNTER — Telehealth: Payer: Self-pay

## 2017-10-01 DIAGNOSIS — R140 Abdominal distension (gaseous): Secondary | ICD-10-CM

## 2017-10-01 NOTE — Telephone Encounter (Signed)
Copied from [place] [id number]. Topic: Referral - Request >> Oct 01, 2017  3:01 PM Bea Graff, NT wrote: Reason for CRM: Pt would like a referral to an allergist for testing per her GI doctor.

## 2017-10-01 NOTE — Telephone Encounter (Signed)
Referral placed.

## 2017-10-01 NOTE — Addendum Note (Signed)
Addended by: Burnard Hawthorne on: 10/01/2017 03:55 PM   Modules accepted: Orders

## 2017-10-01 NOTE — Telephone Encounter (Signed)
Patient aware.

## 2017-10-05 ENCOUNTER — Telehealth: Payer: Self-pay

## 2017-10-05 DIAGNOSIS — N819 Female genital prolapse, unspecified: Secondary | ICD-10-CM | POA: Insufficient documentation

## 2017-10-05 NOTE — Telephone Encounter (Signed)
Advised patient of results per Dr. Darene Lamer.   -  lab work does not suggest celiac disease, which is also consistent with her clinical symptoms discussed during her clinic visit. 1 of her lab markers called IgA is mildly low, which by itself, is nonspecific, and not concerning. The rest of her celiac markers do not suggest celiac disease. She should follow-up with primary care provider in our clinic as scheduled.  Patient expressed understanding and requested result be released to [place].   Released results.

## 2017-10-12 ENCOUNTER — Ambulatory Visit (INDEPENDENT_AMBULATORY_CARE_PROVIDER_SITE_OTHER): Payer: Medicare HMO | Admitting: Allergy and Immunology

## 2017-10-12 ENCOUNTER — Encounter: Payer: Self-pay | Admitting: Allergy and Immunology

## 2017-10-12 VITALS — BP 122/82 | HR 68 | Temp 98.0°F | Resp 20 | Ht 62.28 in | Wt 220.2 lb

## 2017-10-12 DIAGNOSIS — J3089 Other allergic rhinitis: Secondary | ICD-10-CM | POA: Diagnosis not present

## 2017-10-12 DIAGNOSIS — J4540 Moderate persistent asthma, uncomplicated: Secondary | ICD-10-CM | POA: Diagnosis not present

## 2017-10-12 MED ORDER — FLUTICASONE PROPIONATE 50 MCG/ACT NA SUSP: 2.0000 | Freq: Every day | NASAL | 5 refills | Status: DC

## 2017-10-12 MED ORDER — MONTELUKAST SODIUM 10 MG PO TABS: 10.0000 mg | ORAL_TABLET | Freq: Every day | ORAL | 5 refills | Status: DC

## 2017-10-12 MED ORDER — BUDESONIDE-FORMOTEROL FUMARATE 160-4.5 MCG/ACT IN AERO: 2.0000 | INHALATION_SPRAY | Freq: Two times a day (BID) | RESPIRATORY_TRACT | 3 refills | Status: DC

## 2017-10-12 NOTE — Patient Instructions (Addendum)
  1.  Allergen avoidance measures  2.  Treat and prevent inflammation:   A.  Symbicort 160 - 2 inhalations twice a day with spacer  B.  Flonase - 1-2 sprays each nostril 1 time per day  C.  Montelukast 10 mg - 1tablet 1 time per day  3.  If needed:   A.  Proventil HFA 2 puffs every 4-6 hours  B.  OTC loratadine or other antihistamine  4.  Return to clinic in 4 weeks or earlier if problem

## 2017-10-12 NOTE — Progress Notes (Signed)
Dear Brittney Ruiz,  Thank you for referring Brittney Ruiz to the [facility] on 10/12/2017.   Below is a summation of this patient's evaluation and recommendations.  Thank you for your referral. I will keep you informed about this patient's response to treatment.   If you have any questions please do not hesitate to contact me.   Sincerely,  Brittney Prows, MD Allergy / Immunology [facility]   ______________________________________________________________________    NEW PATIENT NOTE  Referring Provider: Burnard Hawthorne, FNP Primary Provider: Burnard Hawthorne, FNP Date of office visit: 10/12/2017    Subjective:   Chief Complaint:  Brittney Ruiz (DOB: Nov 21, 1965) is a 52 y.o. female who presents to the clinic on 10/12/2017 with a chief complaint of Allergy Testing (allergies to foods and enviromental) .     HPI: Brittney Ruiz presents to this clinic in evaluation of several issues.  First, she has an issue of long-standing nature with nasal congestion and sneezing and itchy eyes occurring on a perennial basis with exacerbation during the spring and fall for which she uses Flonase and Claritin which helps this issue.  Provoking factors for her symptoms appear to be exposure to the outdoors.  Second, she has an issue with wheezing and coughing that is under good control with the use of Flovent utilized at a very low dose of 1 inhalation at nighttime with a spacer.  She has been using this medication for the past 6 months which has definitely helped her symptoms that existed prior to this point in time.  Currently her requirement for short acting bronchodilators is about 1 time per week.  It does not sound as though she has required the administration of systemic steroids for this issue in years.  She does not exercise to any degree because of various musculoskeletal issues.  Third, she  complains of this ill-defined swelling problem with her face when she wakes up in the morning.  She feels as though she is puffy around her eyes.  This swelling sensation will diminish throughout the day.  This happens about twice a month and usually lasts less than 1 day without any associated systemic or constitutional symptoms or obvious trigger.  Fourth, she has a history of hives of approximately 8 months duration 15 years ago for which she was tested for allergies at [facility] and found to be allergic to multiple agents.  Presently she avoids aged cheese and apples and hazelnut.  For the past month because of a GI issue she has been off all soy and dairy although she does consume other forms of cheese without any problem.  Fifth, she has been having an issue with constipation and reflux and bloating and nausea for which she saw a gastroenterologist and had an endoscopy and colonoscopy and evaluation for celiac disease which has been negative.  Apparently there is an issue questioning whether or not she has food allergies giving rise to some of the symptoms.  Sixth, she has extreme anxiety and panic attack and sleep disturbance when using any type of oral steroid and she is hesitant about using nasal or inhaled steroids.  Past Medical History:  Diagnosis Date  . Abnormal Pap smear of cervix    h/o LEEP  . Allergy   . Angio-edema   . Anxiety   . Asthma   . Cancer ([place])    melanoma 2003  . Constipation   .  Depression   . Diarrhea   . Hypertension   . Low back pain   . Melanoma ([place])    right foot s/p lymph node removed right groin  . Obesity (BMI 30-39.9)   . Urticaria   . Vaginal delivery    X 2    Past Surgical History:  Procedure Laterality Date  . BLADDER SUSPENSION  07/2017  . COLONOSCOPY WITH PROPOFOL N/A 07/01/2017   Procedure: COLONOSCOPY WITH PROPOFOL;  Surgeon: Virgel Manifold, MD;  Location: [facility] ENDOSCOPY;  Service: Endoscopy;  Laterality: N/A;  .  ESOPHAGOGASTRODUODENOSCOPY (EGD) WITH PROPOFOL N/A 07/01/2017   Procedure: ESOPHAGOGASTRODUODENOSCOPY (EGD) WITH PROPOFOL;  Surgeon: Virgel Manifold, MD;  Location: [facility] ENDOSCOPY;  Service: Endoscopy;  Laterality: N/A;  . LEEP  1995  . LEEP    . PARTIAL HYSTERECTOMY  08/2017  . SKIN CANCER EXCISION     melanoma  . TONSILLECTOMY      Allergies as of 10/12/2017      Reactions   Pork Allergy Swelling   Other reaction(s): OTHER   Pork-derived Products Swelling   Other reaction(s): OTHER Other reaction(s): OTHER Other reaction(s): OTHER   Sulfur Swelling, Hives, Other (See Comments)   Aciphex [rabeprazole Sodium]    Antivert [meclizine Hcl]    Buspar [buspirone Hcl]    Cat Hair Extract    Other reaction(s): SHORTNESS OF BREATH   Chlorpheniramine-pseudoeph    Chlorpheniramine-pseudoeph Other (See Comments)   Chocolate Flavor    Codeine    Crestor [rosuvastatin Calcium]    Could not tolerate    Cymbalta [duloxetine Hcl]    Duloxetine Other (See Comments)   Other reaction(s): HIVES Other reaction(s): HIVES   Effexor Xr [venlafaxine Hydrochloride]    Epinephrine    Erythromycin    Escitalopram Oxalate    Lamictal [lamotrigine]    Levofloxacin Swelling   Levofloxacin   Lipitor [atorvastatin]    Multiple symptoms   Meclizine Other (See Comments)   Mometasone Furoate    Mometasone Furoate    Monosodium Glutamate    Other reaction(s): OTHER Other reaction(s): OTHER   Neurontin [gabapentin]    Hives    Other    Other reaction(s): HIVES Other reaction(s): HIVES Other reaction(s): HIVES Other reaction(s): HIVES   Prednisone    Shellfish Allergy    Other reaction(s): HIVES Other reaction(s): HIVES   Sudafed [pseudoephedrine Hcl]    Sulfa Antibiotics Other (See Comments)   Other reaction(s): HIVES Other reaction(s): HIVES   Sulfasalazine Other (See Comments)   Valium    Wellbutrin [bupropion Hcl]    Xanax Xr [alprazolam]    Alfalfa Rash    Brompheniramine-phenylephrine Palpitations   hypersensitive    Buspirone Rash, Other (See Comments)   Tape Rash      Medication List      ACETAMINOPHEN PO Take by mouth as needed.   albuterol 108 (90 Base) MCG/ACT inhaler Commonly known as:  PROVENTIL HFA;VENTOLIN HFA Inhale 2 puffs into the lungs every 6 (six) hours as needed.   aspirin EC 81 MG tablet Take 81 mg by mouth daily.   BL VITAMIN B COMPLEX PO Place 5,000 Film under the tongue daily.   cholecalciferol 400 units Tabs tablet Commonly known as:  VITAMIN D Take 1,000 Units by mouth.   clorazepate 7.5 MG tablet Commonly known as:  TRANXENE Take 7.5 mg by mouth 3 (three) times daily.   Fish Oil 1000 MG Caps Take 1 capsule by mouth daily.   FLOVENT HFA 110 MCG/ACT inhaler Generic  drug:  fluticasone Inhale 1 puff into the lungs 2 (two) times daily.   fluticasone 50 MCG/ACT nasal spray Commonly known as:  FLONASE Place 2 sprays into both nostrils daily.   loratadine 10 MG tablet Commonly known as:  CLARITIN TAKE ONE (1) TABLET BY MOUTH EVERY DAY   MIRALAX powder Generic drug:  polyethylene glycol powder Take 1 Container by mouth daily.   multivitamin tablet Take 1 tablet by mouth daily.   omeprazole 20 MG capsule Commonly known as:  PRILOSEC TAKE 1 CAPSULE DAILY.   propranolol 20 MG tablet Commonly known as:  INDERAL Take 1 tablet (20 mg total) by mouth 3 (three) times daily.   psyllium 58.6 % powder Commonly known as:  METAMUCIL Take 1 packet by mouth daily.   sertraline 100 MG tablet Commonly known as:  ZOLOFT TAKE 1+1/2 TABLETS BY MOUTH DAILY AS DIRECTED   Spacer/Aero Chamber Mouthpiece Misc 1 puff by Does not apply route as needed.       Review of systems negative except as noted in HPI / PMHx or noted below:  Review of Systems  Constitutional: Negative.   HENT: Negative.   Eyes: Negative.   Respiratory:       Has completed 2 sleep studies over the course of the past several  years with her last study 7 years ago which was "inconclusive" performed for gasping and choking at nighttime.  Apparently to have an additional sleep study performed sometime in the near future.  Cardiovascular: Negative.   Gastrointestinal: Negative.   Genitourinary: Negative.   Musculoskeletal: Negative.   Skin: Negative.   Neurological: Positive for headaches (History of migraines a proximally 3 times per week treated with Tylenol).  Endo/Heme/Allergies: Negative.   Psychiatric/Behavioral: Negative.     Family History  Problem Relation Age of Onset  . Hyperlipidemia Mother   . Hypertension Mother   . Diabetes Mother   . Breast cancer Mother 30  . Cancer Father        lung  . Depression Father   . Hyperlipidemia Sister   . Hypertension Sister   . Migraines Sister   . Hypertension Brother   . Hyperlipidemia Brother   . Migraines Brother   . Cancer Maternal Grandmother        brain  . Depression Maternal Grandfather   . Heart disease Maternal Grandfather   . Cancer Paternal Grandfather        unknown    Social History   Socioeconomic History  . Marital status: Married    Spouse name: Not on file  . Number of children: Not on file  . Years of education: Not on file  . Highest education level: Not on file  Occupational History  . Not on file  Social Needs  . Financial resource strain: Not on file  . Food insecurity:    Worry: Not on file    Inability: Not on file  . Transportation needs:    Medical: Not on file    Non-medical: Not on file  Tobacco Use  . Smoking status: Former Smoker    Packs/day: 1.00    Years: 20.00    Pack years: 20.00    Types: Cigarettes    Last attempt to quit: 03/09/2008    Years since quitting: 9.6  . Smokeless tobacco: Never Used  Substance and Sexual Activity  . Alcohol use: No  . Drug use: No  . Sexual activity: Yes  Lifestyle  . Physical activity:    Days  per week: Not on file    Minutes per session: Not on file  .  Stress: Not on file  Relationships  . Social connections:    Talks on phone: Not on file    Gets together: Not on file    Attends religious service: Not on file    Active member of club or organization: Not on file    Attends meetings of clubs or organizations: Not on file    Relationship status: Not on file  . Intimate partner violence:    Fear of current or ex partner: Not on file    Emotionally abused: Not on file    Physically abused: Not on file    Forced sexual activity: Not on file  Other Topics Concern  . Not on file  Social History Narrative   Married 25 years.        2 children by 1st husband.       Disabled. Not working.        Environmental and Social history  Lives in a house with a dry environment, no animals located inside the household, carpet in the bedroom, plastic on the bed, plastic on the pillow, no smokers located inside the household.  Objective:   Vitals:   10/12/17 0924  BP: 122/82  Pulse: 68  Resp: 20  Temp: 98 F (36.7 C)   Height: 5' 2.28" (158.2 cm) Weight: 220 lb 3.2 oz (99.9 kg)  Physical Exam  HENT:  Head: Normocephalic. Head is without right periorbital erythema and without left periorbital erythema.  Right Ear: Tympanic membrane, external ear and ear canal normal.  Left Ear: Tympanic membrane, external ear and ear canal normal.  Nose: Nose normal. No mucosal edema or rhinorrhea.  Mouth/Throat: Oropharynx is clear and moist and mucous membranes are normal. No oropharyngeal exudate.  Eyes: Pupils are equal, round, and reactive to light. Conjunctivae and lids are normal.  Neck: Trachea normal. No tracheal deviation present. No thyromegaly present.  Cardiovascular: Normal rate, regular rhythm, S1 normal, S2 normal and normal heart sounds.  No murmur heard. Pulmonary/Chest: Effort normal. No stridor. No respiratory distress. She has wheezes (Expiratory wheezes posterior lung fields bilaterally). She has no rales. She exhibits no  tenderness.  Abdominal: Soft. She exhibits no distension and no mass. There is no hepatosplenomegaly. There is no tenderness. There is no rebound and no guarding.  Musculoskeletal: She exhibits no edema or tenderness.  Lymphadenopathy:       Head (right side): No tonsillar adenopathy present.       Head (left side): No tonsillar adenopathy present.    She has no cervical adenopathy.    She has no axillary adenopathy.  Neurological: She is alert.  Skin: No rash noted. She is not diaphoretic. No erythema. No pallor. Nails show no clubbing.    Diagnostics: Allergy skin tests were performed.  She demonstrated hypersensitivity to cat.  She did not demonstrate any hypersensitivity to foods.  Spirometry was performed and demonstrated an FEV1 of 2.27 @ 85 % of predicted. FEV1/FVC = 0.83.  Following the administration of nebulized albuterol her FEV1 did not change significantly.  Results of blood tests obtained 20 April 2017 identified normal hepatic and renal function, WBC 7.6, hemoglobin 14.8, platelet 206, TSH 1.95 IU/mL, T4 0.82 NG/DL  Results of blood tests obtained 27 Sep 2017 identified negative tissue transglutaminase antibody with a serum IgA of 79 mg/DL.  Results of a chest x-ray obtained 26 May 2016 identified the following:  The  cardiomediastinal silhouette is within normal limits. The lungs are clear. No pleural effusion or pneumothorax is identified. No acute osseous abnormality is seen.  Results of an echocardiogram obtained 06 September 2015 identified the following:  - Left ventricle: The cavity size was normal. Wall thickness was   normal. Systolic function was normal. The estimated ejection   fraction was in the range of 60% to 65%. Wall motion was normal;   there were no regional wall motion abnormalities. Left   ventricular diastolic function parameters were normal. - Mitral valve: There was mild malcoaptation of the valve leaflets.   There was mild to moderate  regurgitation. - Left atrium: The atrium was mildly dilated. - Pulmonary arteries: Systolic pressure was mildly increased. PA   peak pressure: 35 mm Hg (S).  Assessment and Plan:    1. Asthma, moderate persistent, well-controlled   2. Other allergic rhinitis     1.  Allergen avoidance measures  2.  Treat and prevent inflammation:   A.  Symbicort 160 - 2 inhalations twice a day with spacer  B.  Flonase - 1-2 sprays each nostril 1 time per day  C.  Montelukast 10 mg - 1tablet 1 time per day  3.  If needed:   A.  Proventil HFA 2 puffs every 4-6 hours  B.  OTC loratadine or other antihistamine  4.  Return to clinic in 4 weeks or earlier if problem  Jernee does appear to be atopic with some degree of respiratory tract inflammation for which she will utilize a combination of therapy including allergen avoidance measures and the use of anti-inflammatory agents for both her upper and lower airway as noted above.  There does not appear to be a significant food allergy contributing to any of her symptoms and I doubt that her GI complaints are tied up with food allergy at this point.  I would like to see her back in this clinic in 4 weeks to assess her response to the therapy noted above and to consider further evaluation and treatment based upon her response.Brittney Prows, MD Allergy / Immunology [facility]

## 2017-10-13 ENCOUNTER — Encounter: Payer: Self-pay | Admitting: Family

## 2017-10-13 ENCOUNTER — Ambulatory Visit (INDEPENDENT_AMBULATORY_CARE_PROVIDER_SITE_OTHER): Payer: Medicare HMO | Admitting: Family

## 2017-10-13 ENCOUNTER — Encounter: Payer: Self-pay | Admitting: Allergy and Immunology

## 2017-10-13 VITALS — BP 110/78 | HR 60 | Temp 97.6°F | Resp 16 | Wt 221.2 lb

## 2017-10-13 DIAGNOSIS — E785 Hyperlipidemia, unspecified: Secondary | ICD-10-CM | POA: Diagnosis not present

## 2017-10-13 DIAGNOSIS — G8929 Other chronic pain: Secondary | ICD-10-CM

## 2017-10-13 DIAGNOSIS — R0683 Snoring: Secondary | ICD-10-CM | POA: Diagnosis not present

## 2017-10-13 DIAGNOSIS — M545 Low back pain, unspecified: Secondary | ICD-10-CM | POA: Insufficient documentation

## 2017-10-13 LAB — LDL CHOLESTEROL, DIRECT: Direct LDL: 172 mg/dL

## 2017-10-13 LAB — LIPID PANEL
CHOL/HDL RATIO: 7
CHOLESTEROL: 261 mg/dL — AB (ref 0–200)
HDL: 38.2 mg/dL — AB (ref >39.00)
NonHDL: 222.71
TRIGLYCERIDES: 324 mg/dL — AB (ref 0.0–149.0)
VLDL: 64.8 mg/dL — AB (ref 0.0–40.0)

## 2017-10-13 NOTE — Assessment & Plan Note (Signed)
Chronic, unchanged. Reviewed Xrays done in 2018. Referral to PT. Patient will let me  Know if no improvement

## 2017-10-13 NOTE — Assessment & Plan Note (Signed)
Not longer on zetia. Following mediterrean diet. Pending lipid  panel

## 2017-10-13 NOTE — Assessment & Plan Note (Signed)
Pending sleep study. Concern for undiagnosed osa

## 2017-10-13 NOTE — Patient Instructions (Addendum)
Labs today  Pending sleep study  Today we discussed referrals, orders. [facility]    I have placed these orders in the system for you.  Please be sure to give [name] a call if you have not heard from our office regarding scheduling a test or regarding referral in a timely manner.  It is very important that you let me know as soon as possible.    This is  Dr. Lupita Dawn  example of a  "Low GI"  Diet:  It will allow you to lose 4 to 8  lbs  per month if you follow it carefully.  Your goal with exercise is a minimum of 30 minutes of aerobic exercise 5 days per week (Walking does not count once it becomes easy!)    All of the foods can be found at grocery stores and in bulk at [employer].  The Atkins protein bars and shakes are available in more varieties at [employer], [employer] and [place].     7 AM Breakfast:  Choose from the following:  Low carbohydrate Protein  Shakes (I recommend the  Premier Protein chocolate shakes,  EAS AdvantEdge "Carb Control" shakes  Or the Atkins shakes all are under 3 net carbs)     a scrambled egg/bacon/cheese burrito made with [employer]'s "carb balance" whole wheat tortilla  (about 10 net carbs )  Regulatory affairs officer (basically a quiche without the pastry crust) that is eaten cold and very convenient way to get your eggs.  8 carbs)  If you make your own protein shakes, avoid bananas and pineapple,  And use low carb greek yogurt or original /unsweetened almond or soy milk    Avoid cereal and bananas, oatmeal and cream of wheat and grits. They are loaded with carbohydrates!   10 AM: high protein snack:  Protein bar by Atkins (the snack size, under 200 cal, usually < 6 net carbs).    A stick of cheese:  Around 1 carb,  100 cal     Dannon Light n Fit [place] Yogurt  (80 cal, 8 carbs)  Other so called "protein bars" and Greek yogurts tend to be loaded with carbohydrates.  Remember, in food advertising, the word "energy" is synonymous for "  carbohydrate."  Lunch:   A Sandwich using the bread choices listed, Can use any  Eggs,  lunchmeat, grilled meat or canned tuna), avocado, regular mayo/mustard  and cheese.  A Salad using blue cheese, ranch,  Goddess or vinagrette,  Avoid taco shells, croutons or "confetti" and no "candied nuts" but regular nuts OK.   No pretzels, nabs  or chips.  Pickles and miniature sweet peppers are a good low carb alternative that provide a "crunch"  The bread is the only source of carbohydrate in a sandwich and  can be decreased by trying some of the attached alternatives to traditional loaf bread   Avoid "Low fat dressings, as well as [place] dressings They are loaded with sugar!   3 PM/ Mid day  Snack:  Consider  1 ounce of  almonds, walnuts, pistachios, pecans, peanuts,  Macadamia nuts or a nut medley.  Avoid "granola and granola bars "  Mixed nuts are ok in moderation as long as there are no raisins,  cranberries or dried fruit.   KIND bars are OK if you get the low glycemic index variety   Try the prosciutto/mozzarella cheese sticks by [employer]  In deli /backery section   High protein  6 PM  Dinner:     Meat/fowl/fish with a green salad, and either broccoli, cauliflower, green beans, spinach, brussel sprouts or  Lima beans. DO NOT BREAD THE PROTEIN!!      There is a low carb pasta by Dreamfield's that is acceptable and tastes great: only 5 digestible carbs/serving.( All grocery stores but BJs carry it ) Several ready made meals are available low carb:   Try Michel Angelo's chicken piccata or chicken or eggplant parm over low carb pasta.(Lowes and BJs)   Marjory Lies Sanchez's "Carnitas" (pulled pork, no sauce,  0 carbs) or his beef pot roast to make a dinner burrito (at [employer]J's)  Pesto over low carb pasta (bj's sells a good quality pesto in the center refrigerated section of the deli   Try satueeing  Cheral Marker with mushroooms as a good side   [employer] makes a mashed cauliflower  that tastes like mashed potatoes  Whole wheat pasta is still full of digestible carbs and  Not as low in glycemic index as Dreamfield's.   Brown rice is still rice,  So skip the rice and noodles if you eat [place] or [place] (or at least limit to 1/2 cup)  9 PM snack :   Breyer's "low carb" fudgsicle or  ice cream bar (Carb Smart line), or  Weight Watcher's ice cream bar , or another "no sugar added" ice cream;  a serving of fresh berries/cherries with whipped cream   Cheese or DANNON'S LlGHT N FIT GREEK YOGURT  8 ounces of [place] unsweetened almond/cococunut milk    Treat yourself to a parfait made with whipped cream blueberiies, walnuts and vanilla greek yogurt  Avoid bananas, pineapple, grapes  and watermelon on a regular basis because they are high in sugar.  THINK OF THEM AS DESSERT  Remember that snack Substitutions should be less than 10 NET carbs per serving and meals < 20 carbs. Remember to subtract fiber grams to get the "net carbs."  @TULLOBREADPACKAGE @

## 2017-10-13 NOTE — Progress Notes (Signed)
Subjective:    Patient ID: Brittney Ruiz, female    DOB: 1965-09-18, 52 y.o.   MRN: [id number]  CC: Brittney Ruiz is a 52 y.o. female who presents today for follow up.   HPI: Overall well today. Has been to specialists since our last visit.   HLD- was getting body aches from zetia, has stopped and aches resolved. Intolerance to statins. Taking fish oil. Starting mediterranean diet  Low back pain- mild improvement over past 6 months since seeing Dr Aundra Dubin; continues to go to chiropracter with some improvemet  Notes snores at night, husband notes she gasps for air.   INterval hx:  Asthma- saw pulmonology yesterday; pending allergy testing; doesn't suspect GI complaints are allergy related. Pending consult with sleep study with sleep specialist.   Chronic constipation/bloating/ gerd- GI 2 weeks ago. EGD/Colonoscopy 2019. Labs do not suggest celiac.   OB GYN- s/p partial hysterectomy - per patient cervix, uterus, fallopian tubes; ovaries intact. Has also had bladder suspension. Pending post op follow up in 2 days.    Lumbar Xr 04/2017- no acute findings.  HISTORY:  Past Medical History:  Diagnosis Date  . Abnormal Pap smear of cervix    h/o LEEP  . Allergy   . Angio-edema   . Anxiety   . Asthma   . Cancer ([place])    melanoma 2003  . Constipation   . Depression   . Diarrhea   . Hypertension   . Low back pain   . Melanoma ([place])    right foot s/p lymph node removed right groin  . Obesity (BMI 30-39.9)   . Urticaria   . Vaginal delivery    X 2   Past Surgical History:  Procedure Laterality Date  . BLADDER SUSPENSION  07/2017  . COLONOSCOPY WITH PROPOFOL N/A 07/01/2017   Procedure: COLONOSCOPY WITH PROPOFOL;  Surgeon: Virgel Manifold, MD;  Location: [facility] ENDOSCOPY;  Service: Endoscopy;  Laterality: N/A;  . ESOPHAGOGASTRODUODENOSCOPY (EGD) WITH PROPOFOL N/A 07/01/2017   Procedure: ESOPHAGOGASTRODUODENOSCOPY (EGD) WITH PROPOFOL;  Surgeon: Virgel Manifold, MD;  Location:  [facility] ENDOSCOPY;  Service: Endoscopy;  Laterality: N/A;  . LEEP  1995  . LEEP    . PARTIAL HYSTERECTOMY  08/2017   s/p partial hysterectomy - per patient cervix, uterus, fallopian tubes; ovaries INTACT  . SKIN CANCER EXCISION     melanoma  . TONSILLECTOMY     Family History  Problem Relation Age of Onset  . Hyperlipidemia Mother   . Hypertension Mother   . Diabetes Mother   . Breast cancer Mother 63  . Cancer Father        lung  . Depression Father   . Hyperlipidemia Sister   . Hypertension Sister   . Migraines Sister   . Hypertension Brother   . Hyperlipidemia Brother   . Migraines Brother   . Cancer Maternal Grandmother        brain  . Depression Maternal Grandfather   . Heart disease Maternal Grandfather   . Cancer Paternal Grandfather        unknown    Allergies: Pork allergy; Pork-derived products; Sulfur; Aciphex [rabeprazole sodium]; Antivert [meclizine hcl]; Buspar [buspirone hcl]; Cat hair extract; Chlorpheniramine-pseudoeph; Chlorpheniramine-pseudoeph; Chocolate flavor; Codeine; Crestor [rosuvastatin calcium]; Cymbalta [duloxetine hcl]; Duloxetine; Effexor xr [venlafaxine hydrochloride]; Epinephrine; Erythromycin; Escitalopram oxalate; Lamictal [lamotrigine]; Levofloxacin; Lipitor [atorvastatin]; Meclizine; Mometasone furoate; Mometasone furoate; Monosodium glutamate; Neurontin [gabapentin]; Other; Prednisone; Shellfish allergy; Sudafed [pseudoephedrine hcl]; Sulfa antibiotics; Sulfasalazine; Valium; Wellbutrin [bupropion hcl]; Xanax xr [  alprazolam]; Alfalfa; Brompheniramine-phenylephrine; Buspirone; and Tape Current Outpatient Medications on File Prior to Visit  Medication Sig Dispense Refill  . ACETAMINOPHEN PO Take by mouth as needed.      Marland Kitchen albuterol (PROVENTIL HFA;VENTOLIN HFA) 108 (90 Base) MCG/ACT inhaler Inhale 2 puffs into the lungs every 6 (six) hours as needed. 1 Inhaler 1  . aspirin EC 81 MG tablet Take 81 mg by mouth daily.    . B Complex Vitamins (BL  VITAMIN B COMPLEX PO) Place 5,000 Film under the tongue daily.    . budesonide-formoterol (SYMBICORT) 160-4.5 MCG/ACT inhaler Inhale 2 puffs into the lungs 2 (two) times daily. 1 Inhaler 3  . cholecalciferol (VITAMIN D) 400 units TABS tablet Take 1,000 Units by mouth.     . clorazepate (TRANXENE) 7.5 MG tablet Take 7.5 mg by mouth 3 (three) times daily.    . fluticasone (FLONASE) 50 MCG/ACT nasal spray Place 2 sprays into both nostrils daily. 16 g 6  . fluticasone (FLONASE) 50 MCG/ACT nasal spray Place 2 sprays into both nostrils daily. 1 g 5  . fluticasone (FLOVENT HFA) 110 MCG/ACT inhaler Inhale 1 puff into the lungs 2 (two) times daily.    Marland Kitchen loratadine (CLARITIN) 10 MG tablet TAKE ONE (1) TABLET BY MOUTH EVERY DAY 90 tablet 3  . montelukast (SINGULAIR) 10 MG tablet Take 1 tablet (10 mg total) by mouth at bedtime. 30 tablet 5  . Multiple Vitamin (MULTIVITAMIN) tablet Take 1 tablet by mouth daily.    . Omega-3 Fatty Acids (FISH OIL) 1000 MG CAPS Take 1 capsule by mouth daily.    Marland Kitchen omeprazole (PRILOSEC) 20 MG capsule TAKE 1 CAPSULE DAILY. 90 capsule 0  . polyethylene glycol powder (MIRALAX) powder Take 1 Container by mouth daily.    . propranolol (INDERAL) 20 MG tablet Take 1 tablet (20 mg total) by mouth 3 (three) times daily. 90 tablet 3  . psyllium (METAMUCIL) 58.6 % powder Take 1 packet by mouth daily.    . sertraline (ZOLOFT) 100 MG tablet TAKE 1+1/2 TABLETS BY MOUTH DAILY AS DIRECTED 45 tablet 3  . Spacer/Aero Chamber Mouthpiece MISC 1 puff by Does not apply route as needed. 1 each 1   No current facility-administered medications on file prior to visit.     Social History   Tobacco Use  . Smoking status: Former Smoker    Packs/day: 1.00    Years: 20.00    Pack years: 20.00    Types: Cigarettes    Last attempt to quit: 03/09/2008    Years since quitting: 9.6  . Smokeless tobacco: Never Used  Substance Use Topics  . Alcohol use: No  . Drug use: No    Review of Systems    Constitutional: Negative for chills and fever.  Respiratory: Negative for cough.   Cardiovascular: Negative for chest pain and palpitations.  Gastrointestinal: Negative for nausea and vomiting.  Musculoskeletal: Positive for back pain (chronic).      Objective:    BP 110/78 (BP Location: Left Arm, Patient Position: Sitting, Cuff Size: Large)   Pulse 60   Temp 97.6 F (36.4 C) (Oral)   Resp 16   Wt 221 lb 4 oz (100.4 kg)   LMP  (LMP Unknown) Comment: urine Preg negative   SpO2 99%   BMI 40.10 kg/m  BP Readings from Last 3 Encounters:  10/13/17 110/78  10/12/17 122/82  09/27/17 117/74   Wt Readings from Last 3 Encounters:  10/13/17 221 lb 4 oz (100.4 kg)  10/12/17 220 lb 3.2 oz (99.9 kg)  09/27/17 218 lb 12.8 oz (99.2 kg)    Physical Exam  Constitutional: She appears well-developed and well-nourished.  Eyes: Conjunctivae are normal.  Cardiovascular: Normal rate, regular rhythm, normal heart sounds and normal pulses.  Pulmonary/Chest: Effort normal and breath sounds normal. She has no wheezes. She has no rhonchi. She has no rales.  Neurological: She is alert.  Skin: Skin is warm and dry.  Psychiatric: She has a normal mood and affect. Her speech is normal and behavior is normal. Thought content normal.  Vitals reviewed.      Assessment & Plan:   Problem List Items Addressed This Visit      Other   HLD (hyperlipidemia) - Primary    Not longer on zetia. Following mediterrean diet. Pending lipid  panel      Relevant Orders   Lipid panel   Chronic midline low back pain    Chronic, unchanged. Reviewed Xrays done in 2018. Referral to PT. Patient will let me  Know if no improvement      Relevant Orders   Ambulatory referral to Physical Therapy   Snores    Pending sleep study. Concern for undiagnosed osa      Relevant Orders   Ambulatory referral to Neurology       I am having Brittney Ruiz. [place] maintain her [place] [employer], ACETAMINOPHEN PO,  clorazepate, multivitamin, sertraline, loratadine, propranolol, fluticasone, omeprazole, albuterol, cholecalciferol, psyllium, B Complex Vitamins (BL VITAMIN B COMPLEX PO), aspirin EC, Fish Oil, fluticasone, polyethylene glycol powder, budesonide-formoterol, fluticasone, and montelukast.   No orders of the defined types were placed in this encounter.   Return precautions given.   Risks, benefits, and alternatives of the medications and treatment plan prescribed today were discussed, and patient expressed understanding.   Education regarding symptom management and diagnosis given to patient on AVS.  Continue to follow with Burnard Hawthorne, FNP for routine health maintenance.   Aggie Hacker and I agreed with plan.   Mable Paris, FNP

## 2017-10-15 DIAGNOSIS — N3946 Mixed incontinence: Secondary | ICD-10-CM | POA: Diagnosis not present

## 2017-10-27 ENCOUNTER — Encounter: Payer: Self-pay | Admitting: Family

## 2017-11-04 ENCOUNTER — Encounter: Payer: Self-pay | Admitting: Neurology

## 2017-11-04 ENCOUNTER — Ambulatory Visit (INDEPENDENT_AMBULATORY_CARE_PROVIDER_SITE_OTHER): Payer: Medicare HMO | Admitting: Neurology

## 2017-11-04 VITALS — BP 140/100 | HR 68 | Ht 62.0 in | Wt 221.0 lb

## 2017-11-04 DIAGNOSIS — Z6841 Body Mass Index (BMI) 40.0 and over, adult: Secondary | ICD-10-CM

## 2017-11-04 DIAGNOSIS — R351 Nocturia: Secondary | ICD-10-CM | POA: Diagnosis not present

## 2017-11-04 DIAGNOSIS — J4520 Mild intermittent asthma, uncomplicated: Secondary | ICD-10-CM | POA: Diagnosis not present

## 2017-11-04 DIAGNOSIS — R0683 Snoring: Secondary | ICD-10-CM | POA: Diagnosis not present

## 2017-11-04 DIAGNOSIS — G4719 Other hypersomnia: Secondary | ICD-10-CM | POA: Diagnosis not present

## 2017-11-04 DIAGNOSIS — R51 Headache: Secondary | ICD-10-CM | POA: Diagnosis not present

## 2017-11-04 DIAGNOSIS — R519 Headache, unspecified: Secondary | ICD-10-CM

## 2017-11-04 DIAGNOSIS — Z820 Family history of epilepsy and other diseases of the nervous system: Secondary | ICD-10-CM | POA: Diagnosis not present

## 2017-11-04 NOTE — Patient Instructions (Signed)

## 2017-11-04 NOTE — Progress Notes (Signed)
Subjective:    Patient ID: Brittney Ruiz is a 52 y.o. female.  HPI     Star Age, MD, PhD [facility] [address] [place], [place] [address]  Dear [name],   I saw your patient, Brittney Ruiz, upon your kind request in my neurologic clinic today for initial consultation of her sleep disorder, in particular, concern for underlying obstructive sleep apnea. The patient is unaccompanied today. As you know, Brittney Ruiz is a 52 year old right-handed woman with an underlying medical history of hyperlipidemia, history of melanoma, low back pain, hypertension, depression, anxiety, asthma, allergies and morbid obesity with BMI of over 40, who reports snoring and excessive daytime somnolence. I reviewed your office note from 10/13/2017. She has had prior home sleep testing which were inconclusive, per her report. Prior sleep test results are not available for my review today. Her Epworth sleepiness score is 15 out of 24, fatigue score is 52/63. She reports loud snoring and waking up with a sense of gasping for air. She is married and lives with her husband, daughter and 2 grandchildren. She has 2 children. She quit smoking about 15 years ago. She does not drink caffeine on a daily basis. She reports having had a nighttime and daytime sleep study at [facility], had a reaction with hives to the tape or the putty that were used. This was about 14-15 years ago. She then had a sleep study at [facility] some 12 years.  BT is between 11 PM to 1 AM, rise time 9-10 AM, she has occasional AM HAs, nocturia about 1/night. FHx of OSA: sister, brother, maternal uncle, all with CPAP machines. Rare RLS symptoms.  Recently had HE and bladder suspension. 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 She has nocturnal GERD, better with PPI.   Her Past Medical History Is Significant For: Past Medical History:  Diagnosis Date  . Abnormal Pap smear of cervix    h/o LEEP  .  Allergy   . Angio-edema   . Anxiety   . Asthma   . Cancer ([place])    melanoma 2003  . Constipation   . Depression   . Diarrhea   . Hypertension   . Low back pain   . Melanoma ([place])    right foot s/p lymph node removed right groin  . Obesity (BMI 30-39.9)   . Urticaria   . Vaginal delivery    X 2    Her Past Surgical History Is Significant For: Past Surgical History:  Procedure Laterality Date  . BLADDER SUSPENSION  07/2017  . COLONOSCOPY WITH PROPOFOL N/A 07/01/2017   Procedure: COLONOSCOPY WITH PROPOFOL;  Surgeon: Virgel Manifold, MD;  Location: [facility] ENDOSCOPY;  Service: Endoscopy;  Laterality: N/A;  . ESOPHAGOGASTRODUODENOSCOPY (EGD) WITH PROPOFOL N/A 07/01/2017   Procedure: ESOPHAGOGASTRODUODENOSCOPY (EGD) WITH PROPOFOL;  Surgeon: Virgel Manifold, MD;  Location: [facility] ENDOSCOPY;  Service: Endoscopy;  Laterality: N/A;  . LEEP  1995  . LEEP    . PARTIAL HYSTERECTOMY  08/2017   s/p partial hysterectomy - per patient cervix, uterus, fallopian tubes; ovaries INTACT  . SKIN CANCER EXCISION     melanoma  . TONSILLECTOMY      Her Family History Is Significant For: Family History  Problem Relation Age of Onset  . Hyperlipidemia Mother   . Hypertension Mother   . Diabetes Mother   . Breast cancer Mother 34  . Cancer Father        lung  .  Depression Father   . Hyperlipidemia Sister   . Hypertension Sister   . Migraines Sister   . Hypertension Brother   . Hyperlipidemia Brother   . Migraines Brother   . Cancer Maternal Grandmother        brain  . Depression Maternal Grandfather   . Heart disease Maternal Grandfather   . Cancer Paternal Grandfather        unknown    Her Social History Is Significant For: Social History   Socioeconomic History  . Marital status: Married    Spouse name: Not on file  . Number of children: Not on file  . Years of education: Not on file  . Highest education level: Not on file  Occupational History  . Not on file  Social  Needs  . Financial resource strain: Not on file  . Food insecurity:    Worry: Not on file    Inability: Not on file  . Transportation needs:    Medical: Not on file    Non-medical: Not on file  Tobacco Use  . Smoking status: Former Smoker    Packs/day: 1.00    Years: 20.00    Pack years: 20.00    Types: Cigarettes    Last attempt to quit: 03/09/2008    Years since quitting: 9.6  . Smokeless tobacco: Never Used  Substance and Sexual Activity  . Alcohol use: No  . Drug use: No  . Sexual activity: Yes  Lifestyle  . Physical activity:    Days per week: Not on file    Minutes per session: Not on file  . Stress: Not on file  Relationships  . Social connections:    Talks on phone: Not on file    Gets together: Not on file    Attends religious service: Not on file    Active member of club or organization: Not on file    Attends meetings of clubs or organizations: Not on file    Relationship status: Not on file  Other Topics Concern  . Not on file  Social History Narrative   Married 25 years.        2 children by 1st husband.       Disabled. Not working.        Her Allergies Are:  Allergies  Allergen Reactions  . Pork Allergy Swelling    Other reaction(s): OTHER  . Pork-Derived Products Swelling    Other reaction(s): OTHER Other reaction(s): OTHER Other reaction(s): OTHER   . Sulfur Swelling, Hives and Other (See Comments)  . Aciphex [Rabeprazole Sodium]   . Antivert [Meclizine Hcl]   . Buspar [Buspirone Hcl]   . Cat Hair Extract     Other reaction(s): SHORTNESS OF BREATH  . Chlorpheniramine-Pseudoeph   . Chlorpheniramine-Pseudoeph Other (See Comments)  . Chocolate Flavor   . Codeine   . Crestor [Rosuvastatin Calcium]     Could not tolerate   . Cymbalta [Duloxetine Hcl]   . Duloxetine Other (See Comments)    Other reaction(s): HIVES Other reaction(s): HIVES   . Effexor Xr [Venlafaxine Hydrochloride]   . Epinephrine   . Erythromycin   . Escitalopram  Oxalate   . Lamictal [Lamotrigine]   . Levofloxacin Swelling    Levofloxacin  . Lipitor [Atorvastatin]     Multiple symptoms  . Meclizine Other (See Comments)  . Mometasone Furoate   . Mometasone Furoate   . Monosodium Glutamate     Other reaction(s): OTHER Other reaction(s): OTHER   .  Neurontin [Gabapentin]     Hives   . Other     Other reaction(s): HIVES Other reaction(s): HIVES Other reaction(s): HIVES Other reaction(s): HIVES   . Prednisone   . Shellfish Allergy     Other reaction(s): HIVES Other reaction(s): HIVES  . Sudafed [Pseudoephedrine Hcl]   . Sulfa Antibiotics Other (See Comments)    Other reaction(s): HIVES Other reaction(s): HIVES   . Sulfasalazine Other (See Comments)  . Valium   . Wellbutrin [Bupropion Hcl]   . Xanax Xr [Alprazolam]   . Alfalfa Rash  . Brompheniramine-Phenylephrine Palpitations    hypersensitive    . Buspirone Rash and Other (See Comments)  . Tape Rash  :   Her Current Medications Are:  Outpatient Encounter Medications as of 11/04/2017  Medication Sig  . ACETAMINOPHEN PO Take by mouth as needed.    Marland Kitchen albuterol (PROVENTIL HFA;VENTOLIN HFA) 108 (90 Base) MCG/ACT inhaler Inhale 2 puffs into the lungs every 6 (six) hours as needed.  Marland Kitchen aspirin EC 81 MG tablet Take 81 mg by mouth daily.  . B Complex Vitamins (BL VITAMIN B COMPLEX PO) Place 5,000 Film under the tongue daily.  . budesonide-formoterol (SYMBICORT) 160-4.5 MCG/ACT inhaler Inhale 2 puffs into the lungs 2 (two) times daily.  . cholecalciferol (VITAMIN D) 400 units TABS tablet Take 1,000 Units by mouth.   . clorazepate (TRANXENE) 7.5 MG tablet Take 7.5 mg by mouth 3 (three) times daily.  . fluticasone (FLONASE) 50 MCG/ACT nasal spray Place 2 sprays into both nostrils daily.  . fluticasone (FLONASE) 50 MCG/ACT nasal spray Place 2 sprays into both nostrils daily.  . fluticasone (FLOVENT HFA) 110 MCG/ACT inhaler Inhale 1 puff into the lungs 2 (two) times daily.  Marland Kitchen loratadine  (CLARITIN) 10 MG tablet TAKE ONE (1) TABLET BY MOUTH EVERY DAY  . montelukast (SINGULAIR) 10 MG tablet Take 1 tablet (10 mg total) by mouth at bedtime.  . Multiple Vitamin (MULTIVITAMIN) tablet Take 1 tablet by mouth daily.  . Omega-3 Fatty Acids (FISH OIL) 1000 MG CAPS Take 1 capsule by mouth daily.  Marland Kitchen omeprazole (PRILOSEC) 20 MG capsule TAKE 1 CAPSULE DAILY.  Marland Kitchen polyethylene glycol powder (MIRALAX) powder Take 1 Container by mouth daily.  . propranolol (INDERAL) 20 MG tablet Take 1 tablet (20 mg total) by mouth 3 (three) times daily.  . psyllium (METAMUCIL) 58.6 % powder Take 1 packet by mouth daily.  . sertraline (ZOLOFT) 100 MG tablet TAKE 1+1/2 TABLETS BY MOUTH DAILY AS DIRECTED  . Spacer/Aero Chamber Mouthpiece MISC 1 puff by Does not apply route as needed.   No facility-administered encounter medications on file as of 11/04/2017.   :  Review of Systems:  Out of a complete 14 point review of systems, all are reviewed and negative with the exception of these symptoms as listed below:  Review of Systems  Neurological:       Pt presents today to discuss her sleep. Pt has had 2 inconclusive sleep studies in the past. Pt does endorse snoring.  Epworth Sleepiness Scale 0= would never doze 1= slight chance of dozing 2= moderate chance of dozing 3= high chance of dozing  Sitting and reading: 3 Watching TV: 2 Sitting inactive in a public place (ex. Theater or meeting): 2 As a passenger in a car for an hour without a break: 3 Lying down to rest in the afternoon: 3 Sitting and talking to someone: 0 Sitting quietly after lunch (no alcohol): 2 In a car, while stopped in traffic:  0 Total: 15     Objective:  Neurological Exam  Physical Exam Physical Examination:   Vitals:   11/04/17 1316  BP: (!) 140/100  Pulse: 68   General Examination: The patient is a very pleasant 52 y.o. female in no acute distress. She appears well-developed and well-nourished and well groomed.   HEENT:  Normocephalic, atraumatic, pupils are equal, round and reactive to light and accommodation. Extraocular tracking is good without limitation to gaze excursion or nystagmus noted. Normal smooth pursuit is noted. Hearing is grossly intact. Face is symmetric with normal facial animation and normal facial sensation. Speech is clear with no dysarthria noted. There is no hypophonia. There is no lip, neck/head, jaw or voice tremor. Neck is supple with full range of passive and active motion. There are no carotid bruits on auscultation. Oropharynx exam reveals: mild mouth dryness, adequate dental hygiene and mild airway crowding, due to smaller airway entry. Mallampati is class I. Tongue protrudes centrally and palate elevates symmetrically. Tonsils are absent. Neck size is 16.25 inches. She has a Mild overbite, missing back teeth, on bottom, partial plate on top.   Chest: Clear to auscultation without wheezing, rhonchi or crackles noted.  Heart: S1+S2+0, regular and normal without murmurs, rubs or gallops noted.   Abdomen: Soft, non-tender and non-distended with normal bowel sounds appreciated on auscultation.  Extremities: There is no pitting edema in the distal lower extremities bilaterally. Pedal pulses are intact.  Skin: Warm and dry without trophic changes noted. There are no varicose veins.  Musculoskeletal: exam reveals no obvious joint deformities, tenderness or joint swelling or erythema.   Neurologically:  Mental status: The patient is awake, alert and oriented in all 4 spheres. Her immediate and remote memory, attention, language skills and fund of knowledge are appropriate. There is no evidence of aphasia, agnosia, apraxia or anomia. Speech is clear with normal prosody and enunciation. Thought process is linear. Mood is normal and affect is normal.  Cranial nerves II - XII are as described above under HEENT exam. In addition: shoulder shrug is normal with equal shoulder height noted. Motor exam:  Normal bulk, strength and tone is noted. There is no drift, tremor or rebound. Romberg is negative. Reflexes are 1+ throughout. Fine motor skills and coordination: intact with normal finger taps, normal hand movements, normal rapid alternating patting, normal foot taps and normal foot agility.  Cerebellar testing: No dysmetria or intention tremor on finger to nose testing. Heel to shin is unremarkable bilaterally. There is no truncal or gait ataxia.  Sensory exam: intact to light touch in the upper and lower extremities.  Gait, station and balance: She stands easily. No veering to one side is noted. No leaning to one side is noted. Posture is age-appropriate and stance is narrow based. Gait shows normal stride length and normal pace. No problems turning are noted. Tandem walk is unremarkable.                Assessment and Plan:  In summary, Brittney Ruiz is a very pleasant 52 y.o.-year old female with an underlying medical history of hyperlipidemia, history of melanoma, low back pain, hypertension, depression, anxiety, asthma, allergies and morbid obesity with BMI of over 40, whose history and physical exam are concerning for obstructive sleep apnea (OSA). I had a long chat with the patient about my findings and the diagnosis of OSA, its prognosis and treatment options. We talked about medical treatments, surgical interventions and non-pharmacological approaches. I explained in particular the risks  and ramifications of untreated moderate to severe OSA, especially with respect to developing cardiovascular disease down the Road, including congestive heart failure, difficult to treat hypertension, cardiac arrhythmias, or stroke. Even type 2 diabetes has, in part, been linked to untreated OSA. Symptoms of untreated OSA include daytime sleepiness, memory problems, mood irritability and mood disorder such as depression and anxiety, lack of energy, as well as recurrent headaches, especially morning headaches. We  talked about trying to maintain a healthy lifestyle in general, as well as the importance of weight control. I encouraged the patient to eat healthy, exercise daily and keep well hydrated, to keep a scheduled bedtime and wake time routine, to not skip any meals and eat healthy snacks in between meals. I advised the patient not to drive when feeling sleepy. I recommended the following at this time: sleep study with potential positive airway pressure titration. (We will score hypopneas at 4%).   I explained the sleep test procedure to the patient and also outlined possible surgical and non-surgical treatment options of OSA, including the use of a custom-made dental device (which would require a referral to a specialist dentist or oral surgeon), upper airway surgical options, such as pillar implants, radiofrequency surgery, tongue base surgery, and UPPP (which would involve a referral to an ENT surgeon). Rarely, jaw surgery such as mandibular advancement may be considered.  I also explained the CPAP treatment option to the patient, who indicated that she would be willing to try CPAP if the need arises. I explained the importance of being compliant with PAP treatment, not only for insurance purposes but primarily to improve Her symptoms, and for the patient's long term health benefit, including to reduce Her cardiovascular risks. I answered all her questions today and the patient was in agreement. I plan to see her back after the sleep study is completed and encouraged her to call with any interim questions, concerns, problems or updates.   Thank you very much for allowing me to participate in the care of this nice patient. If I can be of any further assistance to you please do not hesitate to call me at [phone].  Sincerely,   Star Age, MD, PhD

## 2017-11-05 ENCOUNTER — Telehealth: Payer: Self-pay | Admitting: Allergy and Immunology

## 2017-11-05 NOTE — Telephone Encounter (Signed)
Patient called and would like to speak to someone about the testing she had done. She said she was allergic to cats, but had other testing done and didn't know those results. She also says she can't take any of the medications she was prescribed because of the price and one of them interfered with her anxiety medicine.

## 2017-11-05 NOTE — Telephone Encounter (Addendum)
Called and spoke with patient.  Reviewed skin test results from office visit with Dr. Neldon Mc on 10/12/17.  Patient did not start Symbicort due to cost and Singulair interfering with other medication per pharmacists.  Patient is taking Flonase 2 sprays daily, Flovent 110 mcg 2 puffs every night and Albuterol as needed and states she has not had wheezing or asthma flares.  Will discuss with Dr. Neldon Mc and follow up with patient on Monday.  Patient voiced understanding.

## 2017-11-08 ENCOUNTER — Other Ambulatory Visit: Payer: Self-pay | Admitting: *Deleted

## 2017-11-08 MED ORDER — FLUTICASONE PROPIONATE HFA 110 MCG/ACT IN AERO: 2.0000 | INHALATION_SPRAY | Freq: Two times a day (BID) | RESPIRATORY_TRACT | 0 refills | Status: DC

## 2017-11-08 NOTE — Telephone Encounter (Signed)
Called and left message for patient to call office.  I did not see where patient had been given a sample of Symbicort from office to try.  Need to confirm regarding sample and if patient would like to try a sample and to let her know about [place] application for free medications that would cover Symbicort if she is approved.

## 2017-11-08 NOTE — Telephone Encounter (Signed)
Called and spoke with patient.  Informed patient of recommendation per Dr. Neldon Mc.  Patient states she will continue her Flovent 110 mcg and will do 2 inhalations 2 times a day.  Patient was still hesitant to start Singulair at this time after reviewing medication and interactions with patient.  Patient was advised to make follow up visit with Dr. Neldon Mc and patient states she will make appointment after doing increase in Flovent and possibly starting Singulair.  Patient voiced understanding.

## 2017-11-08 NOTE — Telephone Encounter (Signed)
I think that her anxiety may be playing a role with some of these issues. Please let her know that the advice she received from the pharmacist concerning singulair may not be accurate. The only way to tell if there is an interaction with Zoloft is to use the medication. I do not know of ANY interaction with zoloft. And she can continue on her full dose flovent at 2 inhalations 2 times a day and see me in clinic.

## 2017-11-09 ENCOUNTER — Ambulatory Visit: Payer: Medicare HMO | Admitting: Allergy and Immunology

## 2017-11-11 ENCOUNTER — Ambulatory Visit: Payer: Self-pay | Admitting: Allergy & Immunology

## 2017-11-30 DIAGNOSIS — M6281 Muscle weakness (generalized): Secondary | ICD-10-CM | POA: Diagnosis not present

## 2017-12-13 ENCOUNTER — Ambulatory Visit: Payer: Medicare HMO | Admitting: Neurology

## 2017-12-13 DIAGNOSIS — Z820 Family history of epilepsy and other diseases of the nervous system: Secondary | ICD-10-CM

## 2017-12-13 DIAGNOSIS — R519 Headache, unspecified: Secondary | ICD-10-CM

## 2017-12-13 DIAGNOSIS — R51 Headache: Secondary | ICD-10-CM

## 2017-12-13 DIAGNOSIS — R351 Nocturia: Secondary | ICD-10-CM

## 2017-12-13 DIAGNOSIS — R0683 Snoring: Secondary | ICD-10-CM

## 2017-12-13 DIAGNOSIS — J4520 Mild intermittent asthma, uncomplicated: Secondary | ICD-10-CM

## 2017-12-13 DIAGNOSIS — G4710 Hypersomnia, unspecified: Secondary | ICD-10-CM

## 2017-12-13 DIAGNOSIS — G4719 Other hypersomnia: Secondary | ICD-10-CM

## 2017-12-13 DIAGNOSIS — Z6841 Body Mass Index (BMI) 40.0 and over, adult: Secondary | ICD-10-CM

## 2017-12-15 ENCOUNTER — Encounter

## 2017-12-15 ENCOUNTER — Institutional Professional Consult (permissible substitution): Payer: Medicare HMO | Admitting: Neurology

## 2017-12-21 NOTE — Progress Notes (Signed)
Patient referred by Brittney Paris, NP, seen by me on 11/04/17, HST on 12/14/17.   Please call and notify the patient that the recent home sleep test did not show any significant obstructive sleep apnea, but in light of clinical suspicion for OSA and in light of previous inconclusive sleep studies at other facilities, the home sleep test procedure is not enough to exclude OSA. I will request auth for an attended, in lab sleep study. We will call her if insurance authorizes it.   Thanks,  Star Age, MD, PhD [facility])

## 2017-12-21 NOTE — Procedures (Signed)
[facility] @[facility] [address], [place] [address] NAME: Brittney Ruiz                                                                 DOB: 05-Aug-1965 MEDICAL RECORD NUMBER [id number]                                          DOS:  12/14/17 REFERRING PHYSICIAN: Kathrin Penner, FNP STUDY PERFORMED: Home Sleep Test HISTORY: 52 year old woman with a history of hyperlipidemia, history of melanoma, low back pain, hypertension, depression, anxiety, asthma, allergies and morbid obesity, who reports snoring and excessive daytime somnolence, waking up with a sense of gasping for air. She has previously had sleep studies about 15 years ago, with inconclusive results per patient report. Her Epworth sleepiness score is 15/24. BMI: 40.4.  STUDY RESULTS:  Total Recording Time:  8 hours, 37 minutes Total Apnea/Hypopnea Index (AHI): 1.4/h, RDI:  6.2 /h Average Oxygen Saturation: 93%, Lowest Oxygen Desaturation: 85%  Total Time Oxygen Saturation Below or at 88: 2 minutes  Average Heart Rate: 72 bpm (between 61 and 86 bpm) IMPRESSION: Hypersomnolence, Snoring RECOMMENDATION: This home sleep test did not show evidence of obstructive or central sleep disordered breathing. Snoring was noted. However, in the context of high clinical suspicion for OSA (snoring, larger neck circumference, waking up gasping for air, significant daytime somnolence, and morbid obesity) and in light of previous inconclusive sleep studies at other facilities, the home sleep test procedure is not enough to exclude OSA and an attended, in lab sleep study is indicated and standard practice parameter per AASM recommendation. I will, therefore, order an attended sleep study for this patient. The patient should be cautioned not to drive, work at heights, or operate dangerous or heavy equipment when tired or sleepy. Review and reiteration of good sleep hygiene measures should be pursued with any patient. This study does  not exclude other intrinsic sleep disorders or other causes of the patient's symptoms: circadian rhythm disturbances, an underlying mood disorder, medication effect and/or an underlying medical problem cannot be ruled out on the basis of this test. The patient and her referring provider will be notified of the test results.  I certify that I have reviewed the raw data recording prior to the issuance of this report in accordance with the standards of the American Academy of Sleep Medicine (AASM).   Star Age, MD, PhD [facility]) Diplomat, ABPN (Neurology and Sleep)

## 2017-12-21 NOTE — Addendum Note (Signed)
Addended by: Star Age on: 12/21/2017 08:01 AM   Modules accepted: Orders

## 2017-12-23 ENCOUNTER — Telehealth: Payer: Self-pay

## 2017-12-23 ENCOUNTER — Other Ambulatory Visit: Payer: Self-pay

## 2017-12-23 MED ORDER — BLOOD GLUCOSE METER KIT: PACK | 0 refills | Status: DC

## 2017-12-23 MED ORDER — FLUTICASONE PROPIONATE 50 MCG/ACT NA SUSP: 2.0000 | Freq: Every day | NASAL | 3 refills | Status: DC

## 2017-12-23 NOTE — Telephone Encounter (Signed)
-----   Message from Star Age, MD sent at 12/21/2017  8:01 AM EDT ----- Patient referred by Mable Paris, NP, seen by me on 11/04/17, HST on 12/14/17.   Please call and notify the patient that the recent home sleep test did not show any significant obstructive sleep apnea, but in light of clinical suspicion for OSA and in light of previous inconclusive sleep studies at other facilities, the home sleep test procedure is not enough to exclude OSA. I will request auth for an attended, in lab sleep study. We will call her if insurance authorizes it.   Thanks,  Star Age, MD, PhD [facility])

## 2017-12-23 NOTE — Telephone Encounter (Signed)
I called patient's cell phone but did not get an answer. I left a detailed message on her voicemail with these results, ok per dpr. However, the background was full of static so I advised patient to call me back when she got the message, but I will also try again at a later time.

## 2017-12-24 ENCOUNTER — Ambulatory Visit (INDEPENDENT_AMBULATORY_CARE_PROVIDER_SITE_OTHER): Payer: Medicare HMO | Admitting: Family Medicine

## 2017-12-24 ENCOUNTER — Encounter: Payer: Self-pay | Admitting: Family Medicine

## 2017-12-24 VITALS — BP 116/84 | HR 73 | Temp 98.4°F | Resp 18 | Ht 62.0 in | Wt 226.0 lb

## 2017-12-24 DIAGNOSIS — J411 Mucopurulent chronic bronchitis: Secondary | ICD-10-CM

## 2017-12-24 DIAGNOSIS — R058 Other specified cough: Secondary | ICD-10-CM

## 2017-12-24 DIAGNOSIS — R062 Wheezing: Secondary | ICD-10-CM

## 2017-12-24 DIAGNOSIS — R05 Cough: Secondary | ICD-10-CM

## 2017-12-24 DIAGNOSIS — J4541 Moderate persistent asthma with (acute) exacerbation: Secondary | ICD-10-CM | POA: Diagnosis not present

## 2017-12-24 MED ORDER — ALBUTEROL SULFATE (2.5 MG/3ML) 0.083% IN NEBU: 2.5000 mg | INHALATION_SOLUTION | Freq: Four times a day (QID) | RESPIRATORY_TRACT | 3 refills | Status: DC | PRN

## 2017-12-24 MED ORDER — AMOXICILLIN-POT CLAVULANATE 875-125 MG PO TABS: 1.0000 | ORAL_TABLET | Freq: Two times a day (BID) | ORAL | 0 refills | Status: AC

## 2017-12-24 MED ORDER — ALBUTEROL SULFATE (2.5 MG/3ML) 0.083% IN NEBU
2.5000 mg | INHALATION_SOLUTION | Freq: Once | RESPIRATORY_TRACT | Status: AC
  Administered 2017-12-24: 2.5 mg via RESPIRATORY_TRACT

## 2017-12-24 NOTE — Progress Notes (Signed)
Subjective:    Patient ID: Brittney Ruiz, female    DOB: 02/17/1966, 52 y.o.   MRN: [id number]  HPI   Presents to clinic with sinus congestion, scratchy voice, harsh productive cough. Describes her phlegm as thick and green.    She has a hx of asthma and chronic bronchitis. States it has been very tough to get on a daily maintenence inhaler for her asthma due to many of them causing worsening anxiety - did not like Symbicort, currently using flovent 2 times a day with some success & uses albuterol inhaler PRN.  Patient Active Problem List   Diagnosis Date Noted  . Chronic midline low back pain 10/13/2017  . Snores 10/13/2017  . Prolapse of female genital organs 10/05/2017  . Uterovaginal prolapse 08/17/2017  . Special screening for malignant neoplasms, colon   . Polyp of sigmoid colon   . Internal hemorrhoids   . Diverticulosis of large intestine without diverticulitis   . Heartburn   . Stomach irritation   . Gastric polyp   . Esophageal thickening   . Chronic constipation 08/20/2016  . Bronchitis 08/20/2016  . Routine physical examination 04/20/2016  . Elevated blood pressure reading 04/20/2016  . Lesion of cervix 04/20/2016  . Mitral valve regurgitation 09/17/2015  . Dyspnea 08/26/2015  . Pain in the chest 08/09/2015  . Breast lesion 08/09/2015  . Deflected nasal septum 01/05/2013  . Acid reflux 12/20/2012  . Hay fever 12/05/2012  . Allergic rhinitis due to pollen 12/05/2012  . Chronic infection of sinus 12/04/2012  . Malignant melanoma of foot ([place]) 11/30/2012  . Sinus pressure 10/16/2012  . Agoraphobia with panic disorder 10/12/2012  . Menorrhagia 08/12/2012  . HLD (hyperlipidemia) 08/12/2012  . Medicare annual wellness visit, subsequent 04/21/2012  . Depression   . Allergy   . Anxiety   . Obesity (BMI 30-39.9)    Social History   Tobacco Use  . Smoking status: Former Smoker    Packs/day: 1.00    Years: 20.00    Pack years: 20.00    Types: Cigarettes   Last attempt to quit: 03/09/2008    Years since quitting: 9.8  . Smokeless tobacco: Never Used  Substance Use Topics  . Alcohol use: No   Review of Systems  Constitutional: Positive for fatigue. Negative for chills and fever.  HENT: Positive for congestion, postnasal drip, sinus pressure and sinus pain.   Eyes: Negative for pain, discharge, redness and itching.  Respiratory: Positive for cough, shortness of breath and wheezing.   Cardiovascular: Negative for chest pain, palpitations and leg swelling.  Gastrointestinal: Negative.   Genitourinary: Negative.   Musculoskeletal: Negative.   Skin: Negative for color change, pallor and rash.  Neurological: Negative for syncope, light-headedness and headaches.     Objective:   Physical Exam  Constitutional: She is oriented to person, place, and time. She appears well-developed and well-nourished. No distress.  HENT:  Head: Normocephalic and atraumatic.  Right Ear: External ear normal.  Left Ear: External ear normal.  Minor post nasal drip  Eyes: Conjunctivae and EOM are normal. Right eye exhibits no discharge. Left eye exhibits no discharge. No scleral icterus.  Neck: Normal range of motion. Neck supple. No tracheal deviation present.  Cardiovascular: Normal rate, regular rhythm and normal heart sounds.  No murmur heard. Pulmonary/Chest: Effort normal. She has wheezes.  Scattered rhonchi and wheezes bilat upper lobes. Breath sounds improved after albuterol neb treatment in clinic.   Neurological: She is alert and oriented to person,  place, and time.  Gait normal  Skin: Skin is warm and dry. No pallor.  Psychiatric: She has a normal mood and affect. Her behavior is normal. Thought content normal.  Nursing note and vitals reviewed.    Blood pressure 116/84, pulse 73, temperature 98.4 F (36.9 C), temperature source Oral, resp. rate 18, height 5\' 2"  (1.575 m), weight 226 lb (102.5 kg), SpO2 96 %.  Assessment & Plan:   Acute flare up  of Chronic Bronchitis - Augmentin course. Continue Flovent at home and use albuterol inhaler as needed.  Productive cough - Using Childrens Dimetapp cough syrup with some help  Asthma - We will send Rx for neb machine and albuterol neb liquid to [facility] to see if we can get this for patient at an affordable price -- has tried to get in past, but was too expensive  Wheezes - Improved after neb treatment in clinic Administrations This Visit    albuterol (PROVENTIL) (2.5 MG/3ML) 0.083% nebulizer solution 2.5 mg    Admin Date 12/24/2017 Action Given Dose 2.5 mg Route Nebulization Administered By Nanci Pina, LPN          Follow if symptoms persist or worsen (patient would prefer not to schedule a bronchitis recheck at this time due to cost of co-pay), Keep Feb 2020 appt as scheduled.

## 2017-12-24 NOTE — Patient Instructions (Signed)
Great to meet you!  Keep up good fluid intake, finish full antibiotic course and we will try to see if we can get nebulizer machine/neb treatments for you through a less expensive pharmacy

## 2017-12-28 NOTE — Telephone Encounter (Signed)
I called pt again to discuss. No answer, left a message asking her to call me back. 

## 2017-12-29 ENCOUNTER — Telehealth: Payer: Self-pay | Admitting: Family

## 2017-12-29 NOTE — Telephone Encounter (Signed)
It appears that pt was scheduled for her in lab sleep study yesterday. Pt has yet to call us back to discuss her HST results.

## 2017-12-29 NOTE — Telephone Encounter (Signed)
Copied from [place] [id number]. Topic: Quick Communication - Rx Refill/Question >> Dec 29, 2017  4:23 PM Brittney Ruiz wrote: Medication: albuterol (PROVENTIL) (2.5 MG/3ML) 0.083% nebulizer solution  Pt stated the pharmacy advised her to contact the office to get the prescription sent over.   Preferred Pharmacy (with phone number or street name): [facility], [place] [phone] (Phone) [phone] (Fax)    Agent: Please be advised that RX refills may take up to 3 business days. We ask that you follow-up with your pharmacy.

## 2017-12-30 ENCOUNTER — Other Ambulatory Visit: Payer: Self-pay

## 2017-12-30 DIAGNOSIS — J411 Mucopurulent chronic bronchitis: Secondary | ICD-10-CM

## 2017-12-30 MED ORDER — ALBUTEROL SULFATE (2.5 MG/3ML) 0.083% IN NEBU: 2.5000 mg | INHALATION_SOLUTION | Freq: Four times a day (QID) | RESPIRATORY_TRACT | 3 refills | Status: DC | PRN

## 2017-12-30 NOTE — Telephone Encounter (Signed)
rx for albuterol 0.083 nebulizer solution has been sent to [facility]

## 2017-12-31 ENCOUNTER — Telehealth: Payer: Self-pay

## 2017-12-31 DIAGNOSIS — J411 Mucopurulent chronic bronchitis: Secondary | ICD-10-CM

## 2017-12-31 NOTE — Telephone Encounter (Signed)
Confused here- is this about getting a nebulizer??  Call pt- let her know [facility] called [name] however I am not sure what we do with that information

## 2017-12-31 NOTE — Telephone Encounter (Signed)
Copied from [place] [id number]. Topic: General - Other >> Dec 30, 2017  4:55 PM Cecelia Byars, NT wrote: Reason for CRM: [facility] called and said they are waiting for the patient to get in touch with them and they will  proceed after she gets in touch with her insurance company

## 2018-01-03 MED ORDER — ALBUTEROL SULFATE (2.5 MG/3ML) 0.083% IN NEBU: 2.5000 mg | INHALATION_SOLUTION | Freq: Four times a day (QID) | RESPIRATORY_TRACT | 3 refills | Status: DC | PRN

## 2018-01-03 NOTE — Addendum Note (Signed)
Addended by: Nanci Pina on: 01/03/2018 03:13 PM   Modules accepted: Orders

## 2018-01-03 NOTE — Telephone Encounter (Signed)
I would recommend she start back on the singulair every day in addition to the flonase and claritin. She took an augmentin course, which is a strong antibiotic for sinuses and lungs. If she is still feeling badly by end of week - advise to come in and be seen

## 2018-01-03 NOTE — Telephone Encounter (Signed)
Pt is also using liquid dimetapp cold & allergy and ibuprofen 1 200mg  tablet every 6 hours or 400mg  childrens liquid. Pill at night and liquid during the day.

## 2018-01-03 NOTE — Addendum Note (Signed)
Addended by: Philis Nettle on: 01/03/2018 03:25 PM   Modules accepted: Orders

## 2018-01-03 NOTE — Telephone Encounter (Signed)
Patient has been taking albuterol inhaler, and flovent , name brand Claritin, and Flonase . Singulair was stopped due to increased patient anxiety levels.

## 2018-01-03 NOTE — Telephone Encounter (Signed)
Patient cannot get the Nebulizer treatment from [facility] so she had me send script to [facility] , patient wants to know can we send small supply to [place]. Patient stated she really is no better with a lot of drainage and coughing up green mucous, feels pretty bad still.

## 2018-01-03 NOTE — Telephone Encounter (Signed)
Patient notified of and voiced understanding , she say she normally has to have 2 rounds of ABX to get over this.

## 2018-01-03 NOTE — Telephone Encounter (Signed)
Left message for patient to call office.  

## 2018-01-03 NOTE — Telephone Encounter (Signed)
Rx for neb solution sent to [facility]. Is she using flonase, singulair AND loratadine daily? Should be taking all 3 meds to help control congestion/drainage.  LG

## 2018-01-10 ENCOUNTER — Ambulatory Visit (INDEPENDENT_AMBULATORY_CARE_PROVIDER_SITE_OTHER): Payer: Medicare HMO | Admitting: Neurology

## 2018-01-10 DIAGNOSIS — R351 Nocturia: Secondary | ICD-10-CM

## 2018-01-10 DIAGNOSIS — J4520 Mild intermittent asthma, uncomplicated: Secondary | ICD-10-CM

## 2018-01-10 DIAGNOSIS — R51 Headache: Secondary | ICD-10-CM

## 2018-01-10 DIAGNOSIS — G4710 Hypersomnia, unspecified: Secondary | ICD-10-CM

## 2018-01-10 DIAGNOSIS — G4719 Other hypersomnia: Secondary | ICD-10-CM

## 2018-01-10 DIAGNOSIS — Z820 Family history of epilepsy and other diseases of the nervous system: Secondary | ICD-10-CM

## 2018-01-10 DIAGNOSIS — G4720 Circadian rhythm sleep disorder, unspecified type: Secondary | ICD-10-CM

## 2018-01-10 DIAGNOSIS — Z6841 Body Mass Index (BMI) 40.0 and over, adult: Secondary | ICD-10-CM

## 2018-01-10 DIAGNOSIS — R0683 Snoring: Secondary | ICD-10-CM

## 2018-01-10 DIAGNOSIS — R519 Headache, unspecified: Secondary | ICD-10-CM

## 2018-01-11 ENCOUNTER — Telehealth: Payer: Self-pay

## 2018-01-11 NOTE — Progress Notes (Signed)
Patient referred by her PCP NP, seen by me on 11/04/17, diagnostic PSG on 01/10/18.   Please call and notify the patient that the recent sleep study did not show any significant obstructive sleep apnea. She had some trouble going to sleep and staying asleep. Snoring was noted, mostly in the mild to moderate range. Weight loss will likely help. For disturbing snoring, an oral appliance (through a qualified dentist) can be considered.  She can FU with PCP at this point.  Thanks,  Star Age, MD, PhD [facility])

## 2018-01-11 NOTE — Telephone Encounter (Signed)
-----   Message from Star Age, MD sent at 01/11/2018  8:04 AM EDT ----- Patient referred by her PCP NP, seen by me on 11/04/17, diagnostic PSG on 01/10/18.   Please call and notify the patient that the recent sleep study did not show any significant obstructive sleep apnea. She had some trouble going to sleep and staying asleep. Snoring was noted, mostly in the mild to moderate range. Weight loss will likely help. For disturbing snoring, an oral appliance (through a qualified dentist) can be considered.  She can FU with PCP at this point.  Thanks,  Star Age, MD, PhD [facility])

## 2018-01-11 NOTE — Telephone Encounter (Signed)
I called pt to discuss her sleep study results. No answer, left a message asking her to call me back. 

## 2018-01-11 NOTE — Telephone Encounter (Signed)
Pt returned my call. I advised her that she did not show any significant osa. Pt did have trouble going to sleep and staying asleep.Pt had mild to moderate snoring noted during the sleep study and if this snoring is disturbing to the pt, Dr. Rexene Alberts recommends that pt consider an oral appliance to treat snoring. The oral appliance should be made by a qualified dentist. Pt can follow up with her PCP at this point. Pt asked that I send a copy of this sleep study to Mable Paris, NP. Pt verbalized understanding of results. Pt had no questions at this time but was encouraged to call back if questions arise.

## 2018-01-11 NOTE — Procedures (Signed)
PATIENT'S NAME:  Brittney Ruiz, [place] DOB:      09/24/1965      MR#:    [id number]     DATE OF RECORDING: 01/10/2018 REFERRING M.D.:  Mable Paris, FNP Study Performed:   Baseline Polysomnogram HISTORY: 52 year old woman with a history of hyperlipidemia, history of melanoma, low back pain, hypertension, depression, anxiety, asthma, allergies and morbid obesity, who reports snoring and excessive daytime somnolence. The patient endorsed the Epworth Sleepiness Scale at 15 points. The patient's weight 220 pounds with a height of 62 (inches), resulting in a BMI of 40.6 kg/m2. The patient's neck circumference measured 16.25 inches.  CURRENT MEDICATIONS: Proventil, Aspirin, Symbicort, Tranxene, Flonase, Flovent, Claritin, Singulair, Prilosec, Inderal, Zoloft   PROCEDURE:  This is a multichannel digital polysomnogram utilizing the Somnostar 11.2 system.  Electrodes and sensors were applied and monitored per AASM Specifications.   EEG, EOG, Chin and Limb EMG, were sampled at 200 Hz.  ECG, Snore and Nasal Pressure, Thermal Airflow, Respiratory Effort, CPAP Flow and Pressure, Oximetry was sampled at 50 Hz. Digital video and audio were recorded.      BASELINE STUDY  Lights Out was at 21:12 and Lights On at 04:59.  Total recording time (TRT) was 467.5 minutes, with a total sleep time (TST) of 322 minutes.   The patient's sleep latency was 94.5 minutes, which is delayed. REM latency was 127 minutes, which is mildly delayed. The sleep efficiency was 68.9%, which is reduced.     SLEEP ARCHITECTURE: WASO (Wake after sleep onset) was 12.5 minutes.  There were 16.5 minutes in Stage N1, 133 minutes Stage N2, 88.5 minutes Stage N3 and 84 minutes in Stage REM.  The percentage of Stage N1 was 5.1%, Stage N2 was 41.3%, Stage N3 was 27.5%, which is increased, and Stage R (REM sleep) was 26.1%, which is mildly increased. The arousals were noted as: 26 were spontaneous, 0 were associated with PLMs, 2 were associated with respiratory  events.  RESPIRATORY ANALYSIS:  There were a total of 6 respiratory events:  0 obstructive apneas, 0 central apneas and 0 mixed apneas with a total of 0 apneas and an apnea index (AI) of 0 /hour. There were 6 hypopneas with a hypopnea index of 1.1 /hour. The patient also had 0 respiratory event related arousals (RERAs).      The total APNEA/HYPOPNEA INDEX (AHI) was 1.1 /hour and the total RESPIRATORY DISTURBANCE INDEX was 0. 1.1 /hour.  5 events occurred in REM sleep and 2 events in NREM. The REM AHI was 0. 3.6 /hour, versus a non-REM AHI of .3. The patient spent 265 minutes of total sleep time in the supine position and 57 minutes in non-supine.. The supine AHI was 1.4 versus a non-supine AHI of 0.0.  OXYGEN SATURATION & C02:  The Wake baseline 02 saturation was 94%, with the lowest being 82%. Time spent below 89% saturation equaled 14 minutes (overestimated, due to loss of sensor).  PERIODIC LIMB MOVEMENTS: The patient had a total of 5 Periodic Limb Movements.  The Periodic Limb Movement (PLM) index was .9 and the PLM Arousal index was 0/hour.  Audio and video analysis did not show any abnormal or unusual movements, behaviors, phonations or vocalizations. The patient took 1 bathroom break. Intermittent mild to moderate snoring was noted. The EKG was in keeping with normal sinus rhythm (NSR).   Post-study, the patient indicated that sleep was worse than usual.   IMPRESSION:  1. Primary Snoring 2. Dysfunctions associated with sleep stages or arousal from  sleep   RECOMMENDATIONS:  1. This study does not demonstrate any significant obstructive or central sleep disordered breathing. Snoring was noted and mostly in the mild to moderate range. For disturbing snoring, an oral appliance (through a qualified dentist) can be considered. This study does not support an intrinsic sleep disorder as a cause of the patient's symptoms. Other causes, including circadian rhythm disturbances, an underlying mood  disorder, medication effect and/or an underlying medical problem cannot be ruled out. 2. The patient should be cautioned not to drive, work at heights, or operate dangerous or heavy equipment when tired or sleepy. Review and reiteration of good sleep hygiene measures should be pursued with any patient. 3. This study shows some sleep fragmentation and mildly abnormal sleep stage percentages; these are nonspecific findings and per se do not signify an intrinsic sleep disorder or a cause for the patient's sleep-related symptoms. Causes include (but are not limited to) the first night effect of the sleep study, circadian rhythm disturbances, medication effect or an underlying mood disorder or medical problem.  4. The patient will be advised to follow up with the referring provider, who will be notified of the test results.  I certify that I have reviewed the entire raw data recording prior to the issuance of this report in accordance with the Standards of Accreditation of the American Academy of Sleep Medicine (AASM)   Star Age, MD, PhD Diplomat, American Board of Neurology and Sleep Medicine (Neurology and Sleep Medicine)

## 2018-01-14 DIAGNOSIS — N952 Postmenopausal atrophic vaginitis: Secondary | ICD-10-CM | POA: Diagnosis not present

## 2018-01-14 DIAGNOSIS — K5901 Slow transit constipation: Secondary | ICD-10-CM | POA: Diagnosis not present

## 2018-01-14 DIAGNOSIS — N3941 Urge incontinence: Secondary | ICD-10-CM | POA: Diagnosis not present

## 2018-01-15 ENCOUNTER — Other Ambulatory Visit: Payer: Self-pay | Admitting: Family

## 2018-02-04 ENCOUNTER — Other Ambulatory Visit: Payer: Self-pay | Admitting: Family

## 2018-02-08 ENCOUNTER — Other Ambulatory Visit: Payer: Self-pay | Admitting: Family

## 2018-02-15 DIAGNOSIS — F39 Unspecified mood [affective] disorder: Secondary | ICD-10-CM | POA: Diagnosis not present

## 2018-02-15 DIAGNOSIS — F410 Panic disorder [episodic paroxysmal anxiety] without agoraphobia: Secondary | ICD-10-CM | POA: Diagnosis not present

## 2018-02-15 DIAGNOSIS — F4001 Agoraphobia with panic disorder: Secondary | ICD-10-CM | POA: Diagnosis not present

## 2018-03-01 ENCOUNTER — Other Ambulatory Visit: Payer: Self-pay | Admitting: Family

## 2018-03-02 NOTE — Telephone Encounter (Signed)
flonase previously filled by Dr Neldon Mc  Last office visit 10/13/17 No office visit scheduled

## 2018-03-30 ENCOUNTER — Ambulatory Visit (INDEPENDENT_AMBULATORY_CARE_PROVIDER_SITE_OTHER): Payer: Medicare HMO | Admitting: Gastroenterology

## 2018-03-30 ENCOUNTER — Encounter: Payer: Self-pay | Admitting: Gastroenterology

## 2018-03-30 VITALS — BP 121/81 | HR 74 | Ht 62.0 in | Wt 230.6 lb

## 2018-03-30 DIAGNOSIS — K5900 Constipation, unspecified: Secondary | ICD-10-CM | POA: Diagnosis not present

## 2018-03-31 NOTE — Progress Notes (Signed)
Vonda Antigua, MD [address]  [place], [place] [address]  Main: [phone]  Fax: [phone]   Primary Care Physician: Burnard Hawthorne, FNP  Primary Gastroenterologist:  Dr. Vonda Antigua  Chief Complaint  Patient presents with  . Follow-up    chronic constipation, bloating, acid reflux    HPI: Brittney Ruiz is a 52 y.o. female here for follow-up of constipation.  Denies any bright red blood per rectum.  Denies any weight loss.  No nausea or vomiting.  No heartburn.  No dysphagia.  Patient reports taking Metamucil in the morning and MiraLAX at bedtime.  With this regimen she states her bowel movements have improved from what they were before and she goes to the bathroom every 2 to 3 days for a bowel movement.  If she does not have a bowel movement in 4 days she takes as needed milk of magnesia which allows her to go that day.  Colonoscopy in February 2019 showed white patchy mucosa in the cecum that was biopsied.  2, 2 to 4 mm polyps in the sigmoid colon, completely removed.  Diverticulosis, external and internal hemorrhoids.  Pathology showed mild melanosis coli, and the polyps were focal lymphoid aggregate and hyperplastic changes.  EGD February 2019 showed thickening at the GE junction, biopsied, gastric erythema, gastric polyp. Biopsies of the GE junction nodule showed squamous mucosa with glycogen acanthosis suggestive of reflux fundic gland polyp,  Reactive gastritis, no H. Pylori.   Current Outpatient Medications  Medication Sig Dispense Refill  . ACCU-CHEK AVIVA PLUS test strip USE UP TO FOUR TIMES DAILY AS DIRECTED 100 each 11  . ACCU-CHEK SOFTCLIX LANCETS lancets TEST BLOOD SUGAR UP TO FOUR TIMES DAILY AS DIRECTED 100 each 6  . ACETAMINOPHEN PO Take by mouth as needed.      Marland Kitchen albuterol (PROVENTIL HFA;VENTOLIN HFA) 108 (90 Base) MCG/ACT inhaler Inhale 2 puffs into the lungs every 6 (six) hours as needed. 1 Inhaler 1  . albuterol (PROVENTIL)  (2.5 MG/3ML) 0.083% nebulizer solution Take 3 mLs (2.5 mg total) by nebulization every 6 (six) hours as needed for wheezing or shortness of breath. 30 mL 3  . aspirin EC 81 MG tablet Take 81 mg by mouth daily.    . B Complex Vitamins (BL VITAMIN B COMPLEX PO) Place 5,000 Film under the tongue daily.    . Blood Glucose Monitoring Suppl (ACCU-CHEK AVIVA PLUS) w/Device KIT USE UP TO FOUR TIMES DAILY AS DIRECTED 1 kit 0  . cholecalciferol (VITAMIN D) 400 units TABS tablet Take 1,000 Units by mouth.     . clorazepate (TRANXENE) 7.5 MG tablet Take 7.5 mg by mouth 3 (three) times daily.    . fluticasone (FLONASE) 50 MCG/ACT nasal spray Place 2 sprays into both nostrils daily. 1 g 5  . fluticasone (FLONASE) 50 MCG/ACT nasal spray Place 2 sprays into both nostrils daily. 16 g 3  . fluticasone (FLONASE) 50 MCG/ACT nasal spray USE 2 SPRAYS INTO BOTH NOSTRILS DAILY. 48 g 3  . fluticasone (FLOVENT HFA) 110 MCG/ACT inhaler Inhale 2 puffs into the lungs 2 (two) times daily. 3 Inhaler 0  . loratadine (CLARITIN) 10 MG tablet TAKE ONE (1) TABLET BY MOUTH EVERY DAY 90 tablet 3  . magnesium hydroxide (MILK OF MAGNESIA) 400 MG/5ML suspension Take by mouth daily as needed for mild constipation.    . montelukast (SINGULAIR) 10 MG tablet Take 1 tablet (10 mg total) by mouth at bedtime. 30 tablet 5  .  Multiple Vitamin (MULTIVITAMIN) tablet Take 1 tablet by mouth daily.    . Omega-3 Fatty Acids (FISH OIL) 1000 MG CAPS Take 1 capsule by mouth daily.    Marland Kitchen omeprazole (PRILOSEC) 20 MG capsule TAKE 1 CAPSULE DAILY. 90 capsule 0  . polyethylene glycol powder (MIRALAX) powder Take 1 Container by mouth daily.    . propranolol (INDERAL) 20 MG tablet Take 1 tablet (20 mg total) by mouth 3 (three) times daily. 90 tablet 3  . psyllium (METAMUCIL) 58.6 % powder Take 1 packet by mouth daily.    . sertraline (ZOLOFT) 100 MG tablet TAKE 1+1/2 TABLETS BY MOUTH DAILY AS DIRECTED 45 tablet 3  . Spacer/Aero Chamber Mouthpiece MISC 1 puff by  Does not apply route as needed. 1 each 1   No current facility-administered medications for this visit.     Allergies as of 03/30/2018 - Review Complete 03/30/2018  Allergen Reaction Noted  . Pork allergy Swelling 10/11/2012  . Pork-derived products Swelling 10/11/2012  . Sulfur Swelling, Hives, and Other (See Comments) 09/23/1983  . Aciphex [rabeprazole sodium]  03/10/2011  . Antivert [meclizine hcl]  03/10/2011  . Buspar [buspirone hcl]  03/10/2011  . Cat hair extract  10/11/2012  . Chlorpheniramine-pseudoeph  03/10/2011  . Chlorpheniramine-pseudoeph Other (See Comments) 06/05/2016  . Chocolate flavor  10/14/2012  . Codeine  03/10/2011  . Crestor [rosuvastatin calcium]  06/10/2017  . Cymbalta [duloxetine hcl]  03/10/2011  . Duloxetine Other (See Comments) 10/11/2012  . Effexor xr [venlafaxine hydrochloride]  03/10/2011  . Epinephrine  03/10/2011  . Erythromycin  03/10/2011  . Escitalopram oxalate  03/10/2011  . Lamictal [lamotrigine]  03/10/2011  . Levofloxacin Swelling 04/03/2011  . Lipitor [atorvastatin]  08/09/2015  . Meclizine Other (See Comments) 10/11/2012  . Mometasone furoate  03/10/2011  . Mometasone furoate  03/10/2011  . Monosodium glutamate  10/11/2012  . Neurontin [gabapentin]  03/10/2011  . Other  10/11/2012  . Prednisone  03/10/2011  . Shellfish allergy  10/11/2012  . Sudafed [pseudoephedrine hcl]  03/10/2011  . Sulfa antibiotics Other (See Comments) 03/10/2011  . Sulfasalazine Other (See Comments) 03/10/2011  . Valium  03/10/2011  . Wellbutrin [bupropion hcl]  03/10/2011  . Xanax xr [alprazolam]  03/27/2011  . Alfalfa Rash 10/15/2012  . Brompheniramine-phenylephrine Palpitations 12/05/2012  . Buspirone Rash and Other (See Comments) 10/11/2012  . Tape Rash 03/10/2011    ROS:  General: Negative for anorexia, weight loss, fever, chills, fatigue, weakness. ENT: Negative for hoarseness, difficulty swallowing , nasal congestion. CV: Negative for chest  pain, angina, palpitations, dyspnea on exertion, peripheral edema.  Respiratory: Negative for dyspnea at rest, dyspnea on exertion, cough, sputum, wheezing.  GI: See history of present illness. GU:  Negative for dysuria, hematuria, urinary incontinence, urinary frequency, nocturnal urination.  Endo: Negative for unusual weight change.    Physical Examination:   BP 121/81   Pulse 74   Ht 5' 2" (1.575 m)   Wt 230 lb 9.6 oz (104.6 kg)   LMP  (LMP Unknown) Comment: urine Preg negative   BMI 42.18 kg/m   General: Well-nourished, well-developed in no acute distress.  Eyes: No icterus. Conjunctivae pink. Mouth: Oropharyngeal mucosa moist and pink , no lesions erythema or exudate. Neck: Supple, Trachea midline Abdomen: Bowel sounds are normal, nontender, nondistended, no hepatosplenomegaly or masses, no abdominal bruits or hernia , no rebound or guarding.   Extremities: No lower extremity edema. No clubbing or deformities. Neuro: Alert and oriented x 3.  Grossly intact. Skin: Warm and  dry, no jaundice.   Psych: Alert and cooperative, normal mood and affect.   Labs: CMP     Component Value Date/Time   NA 138 04/20/2017 1158   NA 141 10/02/2014   NA 136 10/06/2012 0434   K 4.2 04/20/2017 1158   K 3.6 10/06/2012 0434   CL 102 04/20/2017 1158   CL 104 10/06/2012 0434   CO2 28 04/20/2017 1158   CO2 26 10/06/2012 0434   GLUCOSE 116 (H) 04/20/2017 1158   GLUCOSE 114 (H) 10/06/2012 0434   BUN 14 04/20/2017 1158   BUN 14 10/02/2014   BUN 7 10/06/2012 0434   CREATININE 0.65 04/20/2017 1158   CREATININE 0.73 08/09/2015 1609   CALCIUM 9.4 04/20/2017 1158   CALCIUM 9.3 10/06/2012 0434   PROT 7.0 04/20/2017 1158   PROT 7.3 10/06/2012 0434   ALBUMIN 4.6 04/20/2017 1158   ALBUMIN 4.2 10/06/2012 0434   AST 21 04/20/2017 1158   AST 17 10/06/2012 0434   ALT 23 04/20/2017 1158   ALT 27 10/06/2012 0434   ALKPHOS 57 04/20/2017 1158   ALKPHOS 64 10/06/2012 0434   BILITOT 0.7 04/20/2017  1158   BILITOT 0.6 10/06/2012 0434   GFRNONAA >60 10/06/2012 0434   GFRAA >60 10/06/2012 0434   Lab Results  Component Value Date   WBC 7.6 04/20/2017   HGB 14.9 04/20/2017   HCT 44.0 04/20/2017   MCV 86.8 04/20/2017   PLT 206.0 04/20/2017    Imaging Studies: No results found.  Assessment and Plan:   Brittney Ruiz is a 52 y.o. y/o female here for follow-up of constipation  Since Metamucil and MiraLAX together have not allowed her to have regular bowel movements every day or every other day and she is having to take milk of magnesia as needed every 4 days, we discussed changing therapy and starting Linzess.  Patient does not want to be on any new medications at this time and does not want to start [place].  She states since her bowel movements have improved with the above regimen she would like to continue to titrate the above medications and start new medications.  Therefore, I have asked her to start taking 2 doses of MiraLAX together at bedtime and stop that morning Metamucil.  After 3 to 4 days of doing this if her bowel movements are better, she can continue 2 doses of MiraLAX at bedtime every day.  If that regimen does not improve her bowel which I have asked her to contact us and she verbalized understanding.  No alarm symptoms present Colonoscopy up-to-date.  Celiac testing does not show any evidence of celiac disease.  In addition, patient does not have any clinical symptoms of celiac disease.    Dr Vonda Antigua

## 2018-04-01 ENCOUNTER — Other Ambulatory Visit: Payer: Self-pay | Admitting: Family

## 2018-04-01 DIAGNOSIS — Z1231 Encounter for screening mammogram for malignant neoplasm of breast: Secondary | ICD-10-CM

## 2018-04-12 DIAGNOSIS — H5213 Myopia, bilateral: Secondary | ICD-10-CM | POA: Diagnosis not present

## 2018-04-21 DIAGNOSIS — L918 Other hypertrophic disorders of the skin: Secondary | ICD-10-CM | POA: Diagnosis not present

## 2018-04-21 DIAGNOSIS — L821 Other seborrheic keratosis: Secondary | ICD-10-CM | POA: Diagnosis not present

## 2018-04-21 DIAGNOSIS — D229 Melanocytic nevi, unspecified: Secondary | ICD-10-CM | POA: Diagnosis not present

## 2018-04-21 DIAGNOSIS — Z1283 Encounter for screening for malignant neoplasm of skin: Secondary | ICD-10-CM | POA: Diagnosis not present

## 2018-04-21 DIAGNOSIS — D170 Benign lipomatous neoplasm of skin and subcutaneous tissue of head, face and neck: Secondary | ICD-10-CM | POA: Diagnosis not present

## 2018-04-21 DIAGNOSIS — Z85820 Personal history of malignant melanoma of skin: Secondary | ICD-10-CM | POA: Diagnosis not present

## 2018-04-21 DIAGNOSIS — L304 Erythema intertrigo: Secondary | ICD-10-CM | POA: Diagnosis not present

## 2018-04-21 DIAGNOSIS — L309 Dermatitis, unspecified: Secondary | ICD-10-CM | POA: Diagnosis not present

## 2018-04-21 DIAGNOSIS — D485 Neoplasm of uncertain behavior of skin: Secondary | ICD-10-CM | POA: Diagnosis not present

## 2018-04-21 DIAGNOSIS — D2372 Other benign neoplasm of skin of left lower limb, including hip: Secondary | ICD-10-CM | POA: Diagnosis not present

## 2018-04-28 DIAGNOSIS — D2372 Other benign neoplasm of skin of left lower limb, including hip: Secondary | ICD-10-CM | POA: Diagnosis not present

## 2018-05-02 ENCOUNTER — Ambulatory Visit
Admission: RE | Admit: 2018-05-02 | Discharge: 2018-05-02 | Disposition: A | Discharge status: Home / Self Care | Payer: Medicare HMO | Source: Ambulatory Visit | Attending: Family | Admitting: Family

## 2018-05-02 ENCOUNTER — Encounter: Payer: Self-pay | Admitting: Family

## 2018-05-02 ENCOUNTER — Ambulatory Visit (INDEPENDENT_AMBULATORY_CARE_PROVIDER_SITE_OTHER): Payer: Medicare HMO | Admitting: Family

## 2018-05-02 ENCOUNTER — Ambulatory Visit (INDEPENDENT_AMBULATORY_CARE_PROVIDER_SITE_OTHER): Payer: Medicare HMO

## 2018-05-02 VITALS — BP 108/66 | HR 67 | Temp 97.6°F | Wt 231.8 lb

## 2018-05-02 DIAGNOSIS — M79672 Pain in left foot: Secondary | ICD-10-CM | POA: Diagnosis not present

## 2018-05-02 DIAGNOSIS — Z23 Encounter for immunization: Secondary | ICD-10-CM

## 2018-05-02 DIAGNOSIS — Z1231 Encounter for screening mammogram for malignant neoplasm of breast: Secondary | ICD-10-CM | POA: Diagnosis not present

## 2018-05-02 DIAGNOSIS — J45901 Unspecified asthma with (acute) exacerbation: Secondary | ICD-10-CM | POA: Insufficient documentation

## 2018-05-02 DIAGNOSIS — R05 Cough: Secondary | ICD-10-CM

## 2018-05-02 DIAGNOSIS — Z0000 Encounter for general adult medical examination without abnormal findings: Secondary | ICD-10-CM

## 2018-05-02 MED ORDER — AMOXICILLIN-POT CLAVULANATE 875-125 MG PO TABS: 1.0000 | ORAL_TABLET | Freq: Two times a day (BID) | ORAL | 0 refills | Status: DC

## 2018-05-02 MED ORDER — FLUTICASONE PROPIONATE HFA 110 MCG/ACT IN AERO: 2.0000 | INHALATION_SPRAY | Freq: Two times a day (BID) | RESPIRATORY_TRACT | 0 refills | Status: DC

## 2018-05-02 NOTE — Progress Notes (Signed)
Subjective:    Patient ID: Brittney Ruiz, female    DOB: 05-Mar-1966, 52 y.o.   MRN: [id number]  CC: Brittney Ruiz is a 52 y.o. female who presents today for physical exam.    HPI: CC: wheezing and coughing onset 4 days ago, unchanged.   Purulent productive cough.  Using flovent , however ran out 5 days ago.  On flonase,ibuprofen,  using albuterol nebulizer twice per day with temporarily relief.  Last nebulizer treatment was this morning.  No SOB, CP, left arm pain or numbness, LE edema. No fever, sinus pain, ear pain.   Allergy testing done- allergic to cats.   Former smoker  Left heel pain , numbness for months. Pain when walking bare foot. Bought new tennis shoes and using ice bottle under left foot, with temporary.   Anxiety - at baseline today. follows with [facility] for propranolol, tranxene       Allergy 09/2017- asthma - advised symbicort, flonase, singulair. Phone call from 10/2017 , he sent in flovent HFA.   HLD- unable to tolerate statins  OSA- mild. Advised weight loss.   Asthma- has seen pulmonology, advised singulair and has not started.    Colorectal Cancer Screening: UTD 2019, also had EGD Breast Cancer Screening: Mammogram due; done today. Cervical Cancer Screening: UTD 2018; s/p partial  hysterectomy  Immunizations       Tetanus - utd        Pneumococcal -  Candidate for pneumo 23  Labs: Screening labs today. Exercise: No regular exercise Alcohol use: none Smoking/tobacco use: former smoker.  Skin: h/o melanoma; follows with dermatology Wears seat belt: Yes.  HISTORY:  Past Medical History:  Diagnosis Date  . Abnormal Pap smear of cervix    h/o LEEP  . Allergy   . Angio-edema   . Anxiety   . Asthma   . Cancer ([place])    melanoma 2003  . Constipation   . Depression   . Diarrhea   . Hypertension   . Low back pain   . Melanoma ([place])    right foot s/p lymph node removed right groin  . Obesity (BMI 30-39.9)   . Urticaria   .  Vaginal delivery    X 2    Past Surgical History:  Procedure Laterality Date  . BLADDER SUSPENSION  07/2017  . COLONOSCOPY WITH PROPOFOL N/A 07/01/2017   Procedure: COLONOSCOPY WITH PROPOFOL;  Surgeon: Brittney Manifold, MD;  Location: [facility] ENDOSCOPY;  Service: Endoscopy;  Laterality: N/A;  . ESOPHAGOGASTRODUODENOSCOPY (EGD) WITH PROPOFOL N/A 07/01/2017   Procedure: ESOPHAGOGASTRODUODENOSCOPY (EGD) WITH PROPOFOL;  Surgeon: Brittney Manifold, MD;  Location: [facility] ENDOSCOPY;  Service: Endoscopy;  Laterality: N/A;  . LEEP  1995  . LEEP    . PARTIAL HYSTERECTOMY  08/2017   s/p partial hysterectomy - per patient cervix, uterus, fallopian tubes; ovaries INTACT  . SKIN CANCER EXCISION     melanoma  . TONSILLECTOMY     Family History  Problem Relation Age of Onset  . Hyperlipidemia Mother   . Hypertension Mother   . Diabetes Mother   . Breast cancer Mother 99  . Cancer Father        lung  . Depression Father   . Hyperlipidemia Sister   . Hypertension Sister   . Migraines Sister   . Hypertension Brother   . Hyperlipidemia Brother   . Migraines Brother   . Cancer Maternal Grandmother        brain  .  Depression Maternal Grandfather   . Heart disease Maternal Grandfather   . Cancer Paternal Grandfather        unknown      ALLERGIES: Pork allergy; Pork-derived products; Sulfur; Aciphex [rabeprazole sodium]; Antivert [meclizine hcl]; Buspar [buspirone hcl]; Cat hair extract; Chlorpheniramine-pseudoeph; Chlorpheniramine-pseudoeph; Chocolate flavor; Codeine; Crestor [rosuvastatin calcium]; Cymbalta [duloxetine hcl]; Duloxetine; Effexor xr [venlafaxine hydrochloride]; Epinephrine; Erythromycin; Escitalopram oxalate; Lamictal [lamotrigine]; Levofloxacin; Lipitor [atorvastatin]; Meclizine; Mometasone furoate; Mometasone furoate; Monosodium glutamate; Neurontin [gabapentin]; Other; Prednisone; Shellfish allergy; Sudafed [pseudoephedrine hcl]; Sulfa antibiotics; Sulfasalazine; Valium;  Wellbutrin [bupropion hcl]; Xanax xr [alprazolam]; Alfalfa; Brompheniramine-phenylephrine; Buspirone; and Tape  Current Outpatient Medications on File Prior to Visit  Medication Sig Dispense Refill  . ACCU-CHEK AVIVA PLUS test strip USE UP TO FOUR TIMES DAILY AS DIRECTED 100 each 11  . ACCU-CHEK SOFTCLIX LANCETS lancets TEST BLOOD SUGAR UP TO FOUR TIMES DAILY AS DIRECTED 100 each 6  . ACETAMINOPHEN PO Take by mouth as needed.      Marland Kitchen albuterol (PROVENTIL HFA;VENTOLIN HFA) 108 (90 Base) MCG/ACT inhaler Inhale 2 puffs into the lungs every 6 (six) hours as needed. 1 Inhaler 1  . albuterol (PROVENTIL) (2.5 MG/3ML) 0.083% nebulizer solution Take 3 mLs (2.5 mg total) by nebulization every 6 (six) hours as needed for wheezing or shortness of breath. 30 mL 3  . aspirin EC 81 MG tablet Take 81 mg by mouth daily.    . B Complex Vitamins (BL VITAMIN B COMPLEX PO) Place 5,000 Film under the tongue daily.    . Blood Glucose Monitoring Suppl (ACCU-CHEK AVIVA PLUS) w/Device KIT USE UP TO FOUR TIMES DAILY AS DIRECTED 1 kit 0  . cholecalciferol (VITAMIN D) 400 units TABS tablet Take 1,000 Units by mouth.     . clorazepate (TRANXENE) 7.5 MG tablet Take 7.5 mg by mouth 3 (three) times daily.    . fluticasone (FLONASE) 50 MCG/ACT nasal spray Place 2 sprays into both nostrils daily. 1 g 5  . fluticasone (FLONASE) 50 MCG/ACT nasal spray Place 2 sprays into both nostrils daily. 16 g 3  . fluticasone (FLONASE) 50 MCG/ACT nasal spray USE 2 SPRAYS INTO BOTH NOSTRILS DAILY. 48 g 3  . loratadine (CLARITIN) 10 MG tablet TAKE ONE (1) TABLET BY MOUTH EVERY DAY 90 tablet 3  . magnesium hydroxide (MILK OF MAGNESIA) 400 MG/5ML suspension Take by mouth daily as needed for mild constipation.    . Multiple Vitamin (MULTIVITAMIN) tablet Take 1 tablet by mouth daily.    . Omega-3 Fatty Acids (FISH OIL) 1000 MG CAPS Take 1 capsule by mouth daily.    Marland Kitchen omeprazole (PRILOSEC) 20 MG capsule TAKE 1 CAPSULE DAILY. 90 capsule 0  .  polyethylene glycol powder (MIRALAX) powder Take 1 Container by mouth daily.    . propranolol (INDERAL) 20 MG tablet Take 1 tablet (20 mg total) by mouth 3 (three) times daily. 90 tablet 3  . psyllium (METAMUCIL) 58.6 % powder Take 1 packet by mouth daily.    . sertraline (ZOLOFT) 100 MG tablet TAKE 1+1/2 TABLETS BY MOUTH DAILY AS DIRECTED 45 tablet 3  . Spacer/Aero Chamber Mouthpiece MISC 1 puff by Does not apply route as needed. 1 each 1  . montelukast (SINGULAIR) 10 MG tablet Take 1 tablet (10 mg total) by mouth at bedtime. (Patient not taking: Reported on 05/02/2018) 30 tablet 5   No current facility-administered medications on file prior to visit.     Social History   Tobacco Use  . Smoking status: Former Smoker  Packs/day: 1.00    Years: 20.00    Pack years: 20.00    Types: Cigarettes    Last attempt to quit: 03/09/2008    Years since quitting: 10.1  . Smokeless tobacco: Never Used  Substance Use Topics  . Alcohol use: No  . Drug use: No    Review of Systems  Constitutional: Negative for chills, fever and unexpected weight change.  HENT: Positive for congestion. Negative for ear pain, sinus pressure, sinus pain and sore throat.   Respiratory: Positive for cough and wheezing. Negative for shortness of breath.   Cardiovascular: Negative for chest pain, palpitations and leg swelling.  Gastrointestinal: Negative for nausea and vomiting.  Genitourinary: Negative for vaginal pain.  Musculoskeletal: Positive for arthralgias (left foot pain). Negative for myalgias.  Skin: Negative for rash.  Neurological: Negative for headaches.  Hematological: Negative for adenopathy.  Psychiatric/Behavioral: Negative for confusion. The patient is nervous/anxious (at baseline).       Objective:    BP 108/66 (BP Location: Left Arm, Patient Position: Sitting, Cuff Size: Large)   Pulse 67   Temp 97.6 F (36.4 C)   Wt 231 lb 12.8 oz (105.1 kg)   LMP  (LMP Unknown) Comment: urine Preg  negative   SpO2 98%   BMI 42.40 kg/m   BP Readings from Last 3 Encounters:  05/02/18 108/66  03/30/18 121/81  12/24/17 116/84   Wt Readings from Last 3 Encounters:  05/02/18 231 lb 12.8 oz (105.1 kg)  03/30/18 230 lb 9.6 oz (104.6 kg)  12/24/17 226 lb (102.5 kg)    Physical Exam Vitals signs reviewed.  Constitutional:      Appearance: She is well-developed.  HENT:     Head: Normocephalic and atraumatic.     Right Ear: Hearing, tympanic membrane, ear canal and external ear normal. No decreased hearing noted. No drainage, swelling or tenderness. No middle ear effusion. No foreign body. Tympanic membrane is not erythematous or bulging.     Left Ear: Hearing, tympanic membrane, ear canal and external ear normal. No decreased hearing noted. No drainage, swelling or tenderness.  No middle ear effusion. No foreign body. Tympanic membrane is not erythematous or bulging.     Nose: Nose normal. No rhinorrhea.     Right Sinus: No maxillary sinus tenderness or frontal sinus tenderness.     Left Sinus: No maxillary sinus tenderness or frontal sinus tenderness.     Mouth/Throat:     Pharynx: Uvula midline. No oropharyngeal exudate or posterior oropharyngeal erythema.     Tonsils: No tonsillar abscesses.  Eyes:     Conjunctiva/sclera: Conjunctivae normal.  Neck:     Thyroid: No thyroid mass or thyromegaly.  Cardiovascular:     Rate and Rhythm: Normal rate and regular rhythm.     Pulses: Normal pulses.     Heart sounds: Normal heart sounds.  Pulmonary:     Effort: Pulmonary effort is normal.     Breath sounds: Examination of the right-lower field reveals wheezing. Examination of the left-lower field reveals wheezing. Wheezing present. No rhonchi or rales.  Chest:     Breasts: Breasts are symmetrical.        Right: No inverted nipple, mass, nipple discharge, skin change or tenderness.        Left: No inverted nipple, mass, nipple discharge, skin change or tenderness.  Musculoskeletal:      Right lower leg: No edema.     Left lower leg: No edema.       Feet:  Comments: Area of pain ss marked on diagram left medial ankle, sole of foot.  No erythema, increased warmth.    Skin intact.  Lymphadenopathy:     Head:     Right side of head: No submental, submandibular, tonsillar, preauricular, posterior auricular or occipital adenopathy.     Left side of head: No submental, submandibular, tonsillar, preauricular, posterior auricular or occipital adenopathy.     Cervical: No cervical adenopathy.     Right cervical: No superficial, deep or posterior cervical adenopathy.    Left cervical: No superficial, deep or posterior cervical adenopathy.  Skin:    General: Skin is warm and dry.  Neurological:     Mental Status: She is alert.  Psychiatric:        Speech: Speech normal.        Behavior: Behavior normal.        Thought Content: Thought content normal.        Assessment & Plan:   Problem List Items Addressed This Visit      Respiratory   Exacerbation of asthma    No acute respiratory distress, SaO2 98%.  Long discussion in regards patient maintenance and rescue inhalers.  She has been out of her Flovent for 4 days, she has also been using 1 puff daily versus the prescribed 2 puffs twice a day.  She is concerned that her anxiety may increase if she increases this dosage.  Advised her to first pick up medication,  and to start slowly working her way up to 2 puffs twice a day if her anxiety allows.  Close vigilance advised.  Due to the nature of her allergies, asthma, we jointly agreed that antibiotic therapy was appropriate.  Pending chest x-ray as well.  Patient will let me know if she is not better.      Relevant Medications   amoxicillin-clavulanate (AUGMENTIN) 875-125 MG tablet   fluticasone (FLOVENT HFA) 110 MCG/ACT inhaler   Other Relevant Orders   VITAMIN D 25 Hydroxy (Vit-D Deficiency, Fractures)   DG Chest 2 View     Other   Routine physical examination -  Primary    Clinical breast exam performed.  Mammogram  Done today.   Deferred pelvic exam in the absence of complaints, Pap smear is also up-to-date.  She also had a hysterectomy earlier this year.  Encouraged exercise program.      Relevant Medications   amoxicillin-clavulanate (AUGMENTIN) 875-125 MG tablet   fluticasone (FLOVENT HFA) 110 MCG/ACT inhaler   Other Relevant Orders   TSH   CBC with Differential/Platelet   Comprehensive metabolic panel   Hemoglobin A1c   Lipid panel   VITAMIN D 25 Hydroxy (Vit-D Deficiency, Fractures)   DG Chest 2 View   B12 and Folate Panel   Ambulatory referral to Podiatry   Left foot pain    Symptoms suggestive of plantar fasciitis.  Referral to podiatry.          I am having Georganna Maxson. Neeson start on amoxicillin-clavulanate. I am also having her maintain her Spacer/Aero Chamber Mouthpiece, ACETAMINOPHEN PO, clorazepate, multivitamin, sertraline, loratadine, propranolol, omeprazole, albuterol, cholecalciferol, psyllium, B Complex Vitamins (BL VITAMIN B COMPLEX PO), aspirin EC, Fish Oil, polyethylene glycol powder, fluticasone, montelukast, fluticasone, albuterol, ACCU-CHEK SOFTCLIX LANCETS, ACCU-CHEK AVIVA PLUS, fluticasone, ACCU-CHEK AVIVA PLUS, magnesium hydroxide, and fluticasone.   Meds ordered this encounter  Medications  . amoxicillin-clavulanate (AUGMENTIN) 875-125 MG tablet    Sig: Take 1 tablet by mouth 2 (two) times daily.  Dispense:  14 tablet    Refill:  0    Order Specific Question:   Supervising Provider    Answer:   Deborra Medina L [[id number]]  . fluticasone (FLOVENT HFA) 110 MCG/ACT inhaler    Sig: Inhale 2 puffs into the lungs 2 (two) times daily.    Dispense:  3 Inhaler    Refill:  0    Order Specific Question:   Supervising Provider    Answer:   Crecencio Mc [[id number]]    Return precautions given.   Risks, benefits, and alternatives of the medications and treatment plan prescribed today were discussed, and patient expressed  understanding.   Education regarding symptom management and diagnosis given to patient on AVS.   Continue to follow with Burnard Hawthorne, FNP for routine health maintenance.   Aggie Hacker and I agreed with plan.   Mable Paris, FNP

## 2018-05-02 NOTE — Assessment & Plan Note (Addendum)
Clinical breast exam performed.  Mammogram  Done today.   Deferred pelvic exam in the absence of complaints, Pap smear is also up-to-date.  She also had a hysterectomy earlier this year.  Encouraged exercise program.

## 2018-05-02 NOTE — Assessment & Plan Note (Signed)
Symptoms suggestive of plantar fasciitis.  Referral to podiatry.

## 2018-05-02 NOTE — Assessment & Plan Note (Addendum)
No acute respiratory distress, SaO2 98%.  Long discussion in regards patient maintenance and rescue inhalers.  She has been out of her Flovent for 4 days, she has also been using 1 puff daily versus the prescribed 2 puffs twice a day.  She is concerned that her anxiety may increase if she increases this dosage.  Advised her to first pick up medication,  and to start slowly working her way up to 2 puffs twice a day if her anxiety allows.  Close vigilance advised.  Due to the nature of her allergies, asthma, we jointly agreed that antibiotic therapy was appropriate.  She will continue nebulizer treatments at home.  Pending chest x-ray as well.  Patient will let me know if she is not better.

## 2018-05-02 NOTE — Patient Instructions (Addendum)
Please take the Flovent HFA as it was prescribed by allergy.  This means 2 puffs twice a day.  I Sent this to your local pharmacy.  You must also start Augmentin.  Please ensure you maintain follow-up with your allergist due to the severity of your asthma  Plenty of water.   Please let me know if you are not better.  Today we discussed referrals, orders. podiatry   I have placed these orders in the system  for you.  Please be sure to give [name] a call if you have not heard from our office regarding this. We should hear from [name] within ONE week with information regarding your appointment. If not, please let me know immediately.

## 2018-05-03 ENCOUNTER — Other Ambulatory Visit: Payer: Self-pay | Admitting: Family

## 2018-05-03 DIAGNOSIS — J189 Pneumonia, unspecified organism: Secondary | ICD-10-CM

## 2018-05-03 LAB — COMPREHENSIVE METABOLIC PANEL
ALT: 33 U/L (ref 0–35)
AST: 30 U/L (ref 0–37)
Albumin: 4.6 g/dL (ref 3.5–5.2)
Alkaline Phosphatase: 60 U/L (ref 39–117)
BUN: 14 mg/dL (ref 6–23)
CALCIUM: 9.3 mg/dL (ref 8.4–10.5)
CO2: 28 mEq/L (ref 19–32)
Chloride: 99 mEq/L (ref 96–112)
Creatinine, Ser: 0.69 mg/dL (ref 0.40–1.20)
GFR: 95.25 mL/min (ref >60.00)
Glucose, Bld: 93 mg/dL (ref 70–99)
Potassium: 4.2 mEq/L (ref 3.5–5.1)
Sodium: 136 mEq/L (ref 135–145)
Total Bilirubin: 0.6 mg/dL (ref 0.2–1.2)
Total Protein: 7 g/dL (ref 6.0–8.3)

## 2018-05-03 LAB — CBC WITH DIFFERENTIAL/PLATELET
Basophils Absolute: 0.1 10*3/uL (ref 0.0–0.1)
Basophils Relative: 0.5 % (ref 0.0–3.0)
EOS PCT: 3.8 % (ref 0.0–5.0)
Eosinophils Absolute: 0.4 10*3/uL (ref 0.0–0.7)
HCT: 42.2 % (ref 36.0–46.0)
Hemoglobin: 14.2 g/dL (ref 12.0–15.0)
Lymphocytes Relative: 23.2 % (ref 12.0–46.0)
Lymphs Abs: 2.3 10*3/uL (ref 0.7–4.0)
MCHC: 33.7 g/dL (ref 30.0–36.0)
MCV: 85.6 fl (ref 78.0–100.0)
Monocytes Absolute: 0.5 10*3/uL (ref 0.1–1.0)
Monocytes Relative: 5.3 % (ref 3.0–12.0)
Neutro Abs: 6.6 10*3/uL (ref 1.4–7.7)
Neutrophils Relative %: 67.2 % (ref 43.0–77.0)
Platelets: 175 10*3/uL (ref 150.0–400.0)
RBC: 4.93 Mil/uL (ref 3.87–5.11)
RDW: 14.6 % (ref 11.5–15.5)
WBC: 9.8 10*3/uL (ref 4.0–10.5)

## 2018-05-03 LAB — TSH: TSH: 2.31 u[IU]/mL (ref 0.35–4.50)

## 2018-05-03 LAB — LIPID PANEL
CHOLESTEROL: 233 mg/dL — AB (ref 0–200)
HDL: 29.6 mg/dL — ABNORMAL LOW (ref >39.00)
NonHDL: 203.03
Total CHOL/HDL Ratio: 8
Triglycerides: 360 mg/dL — ABNORMAL HIGH (ref 0.0–149.0)
VLDL: 72 mg/dL — ABNORMAL HIGH (ref 0.0–40.0)

## 2018-05-03 LAB — HEMOGLOBIN A1C: Hgb A1c MFr Bld: 5.7 % (ref 4.6–6.5)

## 2018-05-03 LAB — B12 AND FOLATE PANEL
Folate: >23.9 ng/mL (ref >5.9)
Vitamin B-12: 480 pg/mL (ref 211–911)

## 2018-05-03 LAB — LDL CHOLESTEROL, DIRECT: Direct LDL: 141 mg/dL

## 2018-05-03 LAB — VITAMIN D 25 HYDROXY (VIT D DEFICIENCY, FRACTURES): VITD: 26.94 ng/mL — ABNORMAL LOW (ref 30.00–100.00)

## 2018-05-03 MED ORDER — DOXYCYCLINE HYCLATE 100 MG PO TABS: 100.0000 mg | ORAL_TABLET | Freq: Two times a day (BID) | ORAL | 0 refills | Status: DC

## 2018-05-03 NOTE — Progress Notes (Signed)
azi

## 2018-05-19 ENCOUNTER — Ambulatory Visit: Payer: Self-pay

## 2018-05-19 DIAGNOSIS — J45901 Unspecified asthma with (acute) exacerbation: Secondary | ICD-10-CM

## 2018-05-19 DIAGNOSIS — Z0000 Encounter for general adult medical examination without abnormal findings: Secondary | ICD-10-CM

## 2018-05-19 NOTE — Telephone Encounter (Signed)
Phone call to pt.  C/o increased sinus pressure in cheeks and across bridge of nose.  Stated she has intermittent headache across brow area.  C/o nasal drainage, right nostril of "light yellow"; left nostril is stuffy, but no drainage. C/o scratchy throat.  C/o intermittent sweats; hasn't checked temperature.  Taking Tylenol at regular intervals.  Reported she completed antibiotics on 12/24, as prescribed for pneumonia.  Reported improvement in chest symptoms; denied cough or wheeze.  Voiced concern that multiple family members have been sick with sinusitis and bronchitis.  Asking about having another antibiotic called in.  Has an appt. 1/6 with PCP.  No available appts. at PCP office tomorrow.  Advised will send message to M. Arnett re: current sx's.  Pt. advised to call office tomorrow, by 12:00 PM, if she hasn't rec'd call with further recommendations.  Care advice per protocol.  Verb. Understanding.  Agrees with plan.       Reason for Disposition . [1] Sinus pain (not just congestion) AND [2] fever  Answer Assessment - Initial Assessment Questions 1. LOCATION: "Where does it hurt?"      Cheeks and across bridge of nose  2. ONSET: "When did the sinus pain start?"  (e.g., hours, days)      yesterday 3. SEVERITY: "How bad is the pain?"   (Scale 1-10; mild, moderate or severe)   - MILD (1-3): doesn't interfere with normal activities    - MODERATE (4-7): interferes with normal activities (e.g., work or school) or awakens from sleep   - SEVERE (8-10): excruciating pain and patient unable to do any normal activities        moderate 4. RECURRENT SYMPTOM: "Have you ever had sinus problems before?" If so, ask: "When was the last time?" and "What happened that time?"      Has had sinusitis 5. NASAL CONGESTION: "Is the nose blocked?" If so, ask, "Can you open it or must you breathe through the mouth?"     One side is draining (right nostril); left nostril is stuffy 6. NASAL DISCHARGE: "Do you have  discharge from your nose?" If so ask, "What color?"     Light yellow 7. FEVER: "Do you have a fever?" If so, ask: "What is it, how was it measured, and when did it start?"      Having sweats ; using Tylenol at regular intervals 8. OTHER SYMPTOMS: "Do you have any other symptoms?" (e.g., sore throat, cough, earache, difficulty breathing)     Scratchy throat; headache;sweats; denied cough or wheeze 9. PREGNANCY: "Is there any chance you are pregnant?" "When was your last menstrual period?"     hysterectomy  Protocols used: SINUS PAIN OR CONGESTION-A-AH  Message from [name] Skeans sent at 05/19/2018 2:48 PM EST   Summary: advise    Pt was seen 12.16.2019 and diagnosed with pneumonia after chest xray. Pt was sent in script for doxycycline (VIBRA-TABS) 100 MG tablet and amoxicillin-clavulanate (AUGMENTIN) 875-125 MG tablet at the same time until 12.23.2019 has is still sick and not feeling well. Pt stated a lot of her family is sick as well. Pt would like advise or if something else can be called in for relief. Pt still has a runny nose, clogged head, sore throat. Pt is not weezing anymore and not coughing up anything anymore. Pt states she just doesn't feel good and is sweating ./Pt is scheduled to come in on Monday please advise

## 2018-05-20 MED ORDER — AMOXICILLIN-POT CLAVULANATE 875-125 MG PO TABS: 1.0000 | ORAL_TABLET | Freq: Two times a day (BID) | ORAL | 0 refills | Status: DC

## 2018-05-20 NOTE — Telephone Encounter (Signed)
Call pt  How has she been using the flovent  ?  CXR showed mild pna 05/02/18.

## 2018-05-20 NOTE — Telephone Encounter (Signed)
Call patient She was recently on doxycycline, Augmentin, I do have concern with adding on her antibiotic regimen due to serious diarrheal infections including C. difficile.  My suspicion is that the bacterial component of her illness has been adequately treated. Please articulate to patient if her symptoms are not improved, particularly rapidly worsening, then she may start the Augmentin; I have given her 5 days of this.  She must get a probiotic.  If her symptoms have slightly improved , then please advise patient that I suspect this is just residual inflammation and she does not need another antibiotic.  If over the weekend, she is a new or worsening symptoms, please advise she go to urgent care. Otherwise, I will see her on Monday, please ensure she keeps that appointment.

## 2018-05-20 NOTE — Telephone Encounter (Signed)
LMTCB

## 2018-05-20 NOTE — Telephone Encounter (Signed)
I spoke with patient & she stated that now it has moved more into her head. She has a runny nose & is yellow. She feels the antibiotics had helped some, but now instead of settling in her chest that it's moved to her head. She now feels worse than she did with pneumonia. She is taking some cold medications, using flonase & flovent, I see nothing available on any schedules here or [facility]. I told patient that we may have to advise walk in, or if she felt it was ok to wait Monday.

## 2018-05-20 NOTE — Addendum Note (Signed)
Addended by: Burnard Hawthorne on: 05/20/2018 12:21 PM   Modules accepted: Orders

## 2018-05-20 NOTE — Telephone Encounter (Signed)
Patient informed of below & verbalizd understanding. She stated that she cannot take probiotics, but eats yogurt daily.

## 2018-05-20 NOTE — Telephone Encounter (Signed)
Please call patient Need more details here.   It sounds like she is feeling a little better, would like for you to triage here.  She was treated with 7 days of Augmentin.  She did appear to have mild pneumonia on her chest x-ray December 16. She been using her Flovent?  Due to the history of her asthma, I would feel much more comfortable if she had a visit, would you see what we have available at [facility], our office, and also see if patient would be willing to see a provider at [facility].   She is coming in on Monday.

## 2018-05-23 ENCOUNTER — Ambulatory Visit (INDEPENDENT_AMBULATORY_CARE_PROVIDER_SITE_OTHER): Payer: Medicare HMO | Admitting: Family

## 2018-05-23 ENCOUNTER — Encounter: Payer: Self-pay | Admitting: Family

## 2018-05-23 ENCOUNTER — Encounter

## 2018-05-23 DIAGNOSIS — J40 Bronchitis, not specified as acute or chronic: Secondary | ICD-10-CM

## 2018-05-23 MED ORDER — ALBUTEROL SULFATE (2.5 MG/3ML) 0.083% IN NEBU
2.5000 mg | INHALATION_SOLUTION | Freq: Once | RESPIRATORY_TRACT | Status: AC
  Administered 2018-05-23: 2.5 mg via RESPIRATORY_TRACT

## 2018-05-23 NOTE — Assessment & Plan Note (Addendum)
Patient is afebrile.  Considering viral etiology.   Improved after first course antibiotics Augmentin, doxycycline.  Cough has dramatically improved.  She had some modest improvement on 3 days of Augmentin so we jointly agreed reasonable to continue treatment to cover for bacterial component.  She is not in acute respiratory distress.  Encouraged her to continue Flovent, Flonase and nebulizer treatments at home.  She will maintain close vigilance.  Is unable to take steroids due to anxiety.  Counseled on use of multiple antibiotics, she will continue probiotics.  Patient will return in 1 month, we will repeat chest x-ray.  Deferred repeating chest x-ray today, as last chest x-ray done < one month and also cough improved.  Close vigilance

## 2018-05-23 NOTE — Progress Notes (Signed)
Subjective:    Patient ID: Brittney Ruiz, female    DOB: [birth year], 53 y.o.   MRN: [id number]  CC: Brittney Ruiz is a 53 y.o. female who presents today for follow up.   HPI: Congestion x 9 days, second course of Augmentin for 3 days, some improvement.   URI Resolved completely after doxycycline and augmentin from 12/16 , felt well for 3 days and then suddenly congestion again about 9 days ago. Little wheeze, sore throat.  Woke up with hoarseness. Has 'mild' HA , not worse HA of life. 'not a migraine.' No vision changes. Ibuprofen , Dimetapp cold & allergy with some relief.    Feels 'winded' feeling as wearing mask today. No breathing treatment today.  Using flonase, albuterol. NO leg swelling.   Eating greek yogurt. No diarhea  Husband has been sick as well.     HISTORY:  Past Medical History:  Diagnosis Date  . Abnormal Pap smear of cervix    h/o LEEP  . Allergy   . Angio-edema   . Anxiety   . Asthma   . Cancer ([place])    melanoma 2003  . Constipation   . Depression   . Diarrhea   . Hypertension   . Low back pain   . Melanoma ([place])    right foot s/p lymph node removed right groin  . Obesity (BMI 30-39.9)   . Urticaria   . Vaginal delivery    X 2   Past Surgical History:  Procedure Laterality Date  . BLADDER SUSPENSION  07/2017  . COLONOSCOPY WITH PROPOFOL N/A 07/01/2017   Procedure: COLONOSCOPY WITH PROPOFOL;  Surgeon: Virgel Manifold, MD;  Location: [facility] ENDOSCOPY;  Service: Endoscopy;  Laterality: N/A;  . ESOPHAGOGASTRODUODENOSCOPY (EGD) WITH PROPOFOL N/A 07/01/2017   Procedure: ESOPHAGOGASTRODUODENOSCOPY (EGD) WITH PROPOFOL;  Surgeon: Virgel Manifold, MD;  Location: [facility] ENDOSCOPY;  Service: Endoscopy;  Laterality: N/A;  . LEEP  1995  . LEEP    . PARTIAL HYSTERECTOMY  08/2017   s/p partial hysterectomy - per patient cervix, uterus, fallopian tubes; ovaries INTACT  . SKIN CANCER EXCISION     melanoma  . TONSILLECTOMY     Family History  Problem  Relation Age of Onset  . Hyperlipidemia Mother   . Hypertension Mother   . Diabetes Mother   . Breast cancer Mother 62  . Cancer Father        lung  . Depression Father   . Hyperlipidemia Sister   . Hypertension Sister   . Migraines Sister   . Hypertension Brother   . Hyperlipidemia Brother   . Migraines Brother   . Cancer Maternal Grandmother        brain  . Depression Maternal Grandfather   . Heart disease Maternal Grandfather   . Cancer Paternal Grandfather        unknown    Allergies: Pork allergy; Pork-derived products; Sulfur; Aciphex [rabeprazole sodium]; Antivert [meclizine hcl]; Buspar [buspirone hcl]; Cat hair extract; Chlorpheniramine-pseudoeph; Chlorpheniramine-pseudoeph; Chocolate flavor; Codeine; Crestor [rosuvastatin calcium]; Cymbalta [duloxetine hcl]; Duloxetine; Effexor xr [venlafaxine hydrochloride]; Epinephrine; Erythromycin; Escitalopram oxalate; Lamictal [lamotrigine]; Levofloxacin; Lipitor [atorvastatin]; Meclizine; Mometasone furoate; Mometasone furoate; Monosodium glutamate; Neurontin [gabapentin]; Other; Prednisone; Shellfish allergy; Sudafed [pseudoephedrine hcl]; Sulfa antibiotics; Sulfasalazine; Valium; Wellbutrin [bupropion hcl]; Xanax xr [alprazolam]; Alfalfa; Brompheniramine-phenylephrine; Buspirone; and Tape Current Outpatient Medications on File Prior to Visit  Medication Sig Dispense Refill  . ACCU-CHEK AVIVA PLUS test strip USE UP TO FOUR TIMES DAILY AS DIRECTED 100 each 11  .  ACCU-CHEK SOFTCLIX LANCETS lancets TEST BLOOD SUGAR UP TO FOUR TIMES DAILY AS DIRECTED 100 each 6  . ACETAMINOPHEN PO Take by mouth as needed.      Marland Kitchen albuterol (PROVENTIL HFA;VENTOLIN HFA) 108 (90 Base) MCG/ACT inhaler Inhale 2 puffs into the lungs every 6 (six) hours as needed. 1 Inhaler 1  . albuterol (PROVENTIL) (2.5 MG/3ML) 0.083% nebulizer solution Take 3 mLs (2.5 mg total) by nebulization every 6 (six) hours as needed for wheezing or shortness of breath. 30 mL 3  .  amoxicillin-clavulanate (AUGMENTIN) 875-125 MG tablet Take 1 tablet by mouth 2 (two) times daily. 10 tablet 0  . aspirin EC 81 MG tablet Take 81 mg by mouth daily.    . B Complex Vitamins (BL VITAMIN B COMPLEX PO) Place 5,000 Film under the tongue daily.    . Blood Glucose Monitoring Suppl (ACCU-CHEK AVIVA PLUS) w/Device KIT USE UP TO FOUR TIMES DAILY AS DIRECTED 1 kit 0  . cholecalciferol (VITAMIN D) 400 units TABS tablet Take 1,000 Units by mouth.     . clorazepate (TRANXENE) 7.5 MG tablet Take 7.5 mg by mouth 3 (three) times daily.    . fluticasone (FLONASE) 50 MCG/ACT nasal spray Place 2 sprays into both nostrils daily. 1 g 5  . fluticasone (FLONASE) 50 MCG/ACT nasal spray USE 2 SPRAYS INTO BOTH NOSTRILS DAILY. 48 g 3  . fluticasone (FLOVENT HFA) 110 MCG/ACT inhaler Inhale 2 puffs into the lungs 2 (two) times daily. 3 Inhaler 0  . loratadine (CLARITIN) 10 MG tablet TAKE ONE (1) TABLET BY MOUTH EVERY DAY 90 tablet 3  . magnesium hydroxide (MILK OF MAGNESIA) 400 MG/5ML suspension Take by mouth daily as needed for mild constipation.    . montelukast (SINGULAIR) 10 MG tablet Take 1 tablet (10 mg total) by mouth at bedtime. 30 tablet 5  . Multiple Vitamin (MULTIVITAMIN) tablet Take 1 tablet by mouth daily.    . Omega-3 Fatty Acids (FISH OIL) 1000 MG CAPS Take 1 capsule by mouth daily.    Marland Kitchen omeprazole (PRILOSEC) 20 MG capsule TAKE 1 CAPSULE DAILY. 90 capsule 0  . polyethylene glycol powder (MIRALAX) powder Take 1 Container by mouth daily.    . propranolol (INDERAL) 20 MG tablet Take 1 tablet (20 mg total) by mouth 3 (three) times daily. 90 tablet 3  . psyllium (METAMUCIL) 58.6 % powder Take 1 packet by mouth daily.    . sertraline (ZOLOFT) 100 MG tablet TAKE 1+1/2 TABLETS BY MOUTH DAILY AS DIRECTED 45 tablet 3  . Spacer/Aero Chamber Mouthpiece MISC 1 puff by Does not apply route as needed. 1 each 1   No current facility-administered medications on file prior to visit.     Social History    Tobacco Use  . Smoking status: Former Smoker    Packs/day: 1.00    Years: 20.00    Pack years: 20.00    Types: Cigarettes    Last attempt to quit: 03/09/2008    Years since quitting: 10.2  . Smokeless tobacco: Never Used  Substance Use Topics  . Alcohol use: No  . Drug use: No    Review of Systems  Constitutional: Negative for chills and fever.  HENT: Positive for congestion and voice change. Negative for ear pain, sinus pressure, sore throat and trouble swallowing.   Eyes: Negative for visual disturbance.  Respiratory: Positive for cough, shortness of breath and wheezing.   Cardiovascular: Negative for chest pain and palpitations.  Gastrointestinal: Negative for nausea and vomiting.  Neurological:  Positive for headaches.      Objective:    BP 122/80 (BP Location: Left Arm, Patient Position: Sitting, Cuff Size: Large)   Pulse 72   Temp 98 F (36.7 C)   Wt 227 lb 6.4 oz (103.1 kg)   LMP  (LMP Unknown) Comment: urine Preg negative   SpO2 95%   BMI 41.59 kg/m  BP Readings from Last 3 Encounters:  05/23/18 122/80  05/02/18 108/66  03/30/18 121/81   Wt Readings from Last 3 Encounters:  05/23/18 227 lb 6.4 oz (103.1 kg)  05/02/18 231 lb 12.8 oz (105.1 kg)  03/30/18 230 lb 9.6 oz (104.6 kg)    Physical Exam Vitals signs reviewed.  Constitutional:      Appearance: She is well-developed.  HENT:     Head: Normocephalic and atraumatic.     Right Ear: Hearing, tympanic membrane, ear canal and external ear normal. No decreased hearing noted. No drainage, swelling or tenderness. No middle ear effusion. No foreign body. Tympanic membrane is not erythematous or bulging.     Left Ear: Hearing, tympanic membrane, ear canal and external ear normal. No decreased hearing noted. No drainage, swelling or tenderness.  No middle ear effusion. No foreign body. Tympanic membrane is not erythematous or bulging.     Nose: Nose normal. No rhinorrhea.     Right Sinus: No maxillary sinus  tenderness or frontal sinus tenderness.     Left Sinus: No maxillary sinus tenderness or frontal sinus tenderness.     Mouth/Throat:     Pharynx: Uvula midline. No oropharyngeal exudate or posterior oropharyngeal erythema.     Tonsils: No tonsillar abscesses.  Eyes:     Conjunctiva/sclera: Conjunctivae normal.  Cardiovascular:     Rate and Rhythm: Regular rhythm.     Pulses: Normal pulses.     Heart sounds: Normal heart sounds.  Pulmonary:     Effort: Pulmonary effort is normal.     Breath sounds: Normal breath sounds. No wheezing, rhonchi or rales.     Comments: Few expiratory wheezes heard on exam. Lymphadenopathy:     Head:     Right side of head: No submental, submandibular, tonsillar, preauricular, posterior auricular or occipital adenopathy.     Left side of head: No submental, submandibular, tonsillar, preauricular, posterior auricular or occipital adenopathy.     Cervical: No cervical adenopathy.  Skin:    General: Skin is warm and dry.  Neurological:     Mental Status: She is alert.  Psychiatric:        Speech: Speech normal.        Behavior: Behavior normal.        Thought Content: Thought content normal.   Albuterol treatment given. Of note: patient left prior to my being able to return to room to listen to her lungs.       Assessment & Plan:   Problem List Items Addressed This Visit      Respiratory   Bronchitis    Patient is afebrile.  Considering viral etiology.   Improved after first course antibiotics Augmentin, doxycycline.  Cough has dramatically improved.  She had some modest improvement on 3 days of Augmentin so we jointly agreed reasonable to continue treatment to cover for bacterial component.  She is not in acute respiratory distress.  Encouraged her to continue Flovent, Flonase and nebulizer treatments at home.  She will maintain close vigilance.  Is unable to take steroids due to anxiety.  Counseled on use of multiple antibiotics, she  will continue  probiotics.  Patient will return in 1 month, we will repeat chest x-ray.  Deferred repeating chest x-ray today, as last chest x-ray done < one month and also cough improved.  Close vigilance      Relevant Medications   albuterol (PROVENTIL) (2.5 MG/3ML) 0.083% nebulizer solution 2.5 mg (Completed)       I am having Brittney Ruiz maintain her [employer], ACETAMINOPHEN PO, clorazepate, multivitamin, sertraline, loratadine, propranolol, omeprazole, albuterol, cholecalciferol, psyllium, B Complex Vitamins (BL VITAMIN B COMPLEX PO), aspirin EC, Fish Oil, polyethylene glycol powder, fluticasone, montelukast, albuterol, ACCU-CHEK SOFTCLIX LANCETS, ACCU-CHEK AVIVA PLUS, fluticasone, ACCU-CHEK AVIVA PLUS, magnesium hydroxide, fluticasone, and amoxicillin-clavulanate. We administered albuterol.   Meds ordered this encounter  Medications  . albuterol (PROVENTIL) (2.5 MG/3ML) 0.083% nebulizer solution 2.5 mg    Return precautions given.   Risks, benefits, and alternatives of the medications and treatment plan prescribed today were discussed, and patient expressed understanding.   Education regarding symptom management and diagnosis given to patient on AVS.  Continue to follow with Burnard Hawthorne, FNP for routine health maintenance.   Aggie Hacker and I agreed with plan.   Mable Paris, FNP

## 2018-05-23 NOTE — Patient Instructions (Addendum)
Stay on flonase Nebulizer treatment when you get home Continue Flovent  Finish Augmentin Continue greek yogurt  Come back in one month and we need to repeat your chest xray   Please let me know if you are not better

## 2018-06-01 DIAGNOSIS — F4001 Agoraphobia with panic disorder: Secondary | ICD-10-CM | POA: Diagnosis not present

## 2018-06-01 DIAGNOSIS — F39 Unspecified mood [affective] disorder: Secondary | ICD-10-CM | POA: Diagnosis not present

## 2018-06-01 DIAGNOSIS — F410 Panic disorder [episodic paroxysmal anxiety] without agoraphobia: Secondary | ICD-10-CM | POA: Diagnosis not present

## 2018-06-08 NOTE — Progress Notes (Signed)
Cardiology Office Note Date:  06/09/2018  Patient ID:  Brittney Ruiz 12-Aug-1965, MRN [id number] PCP:  Burnard Hawthorne, FNP  Cardiologist:  Formerly, Dr. Yvone Neu, MD    Chief Complaint: Follow-up  History of Present Illness: Brittney Ruiz is a 53 y.o. female with history of mitral regurgitation, hypertension, hyperlipidemia intolerant to atorvastatin secondary to weight gain and Crestor secondary to uncertain reasons, multiple medication intolerances (39 allergies/intolerances noted to time of her office visit) prior tobacco abuse, asthma, obesity, anxiety, and snoring without evidence of obstructive sleep apnea on recent sleep study in 2019 who presents for follow-up of mitral regurgitation.  Patient was previously evaluated by Dr. Yvone Neu for evaluation of chest pain and shortness of breath.  In 08/2015 she underwent echo which showed an EF of 60 to 65%, no regional wall motion abnormalities, mild coaptation of the mitral valve leaflets with mild to moderate regurgitation, mildly dilated left atrium, PASP 35 mmHg.  Nuclear stress testing at that time showed no significant ischemia, EF 53%, low risk scan.  She was last seen by Dr. Yvone Neu in 09/2015 for follow-up of the above.  In late 04/2018 into early 05/2018 the patient has been dealing with bronchitis requiring multiple rounds of antibiotics.  Chest x-ray on 05/02/2018 showed questionable infiltrate in the left upper lung.  Labs: 04/2018 - TSH normal, CBC unremarkable, potassium 4.2, serum creatinine 0.69, LFT normal, A1c 5.7, TC 233, TG 360, HDL 29, direct LDL 141  Patient comes in doing reasonably well.  She notes over the past week she has began a walking regimen at home with her treadmill and is walking approximately 20 minutes/day.  Around the 18-minute mark, she develops left-sided chest pressure that improves with rest.  Of note, prior to the patient's surgical interventions in 08/2016 she was walking 45 minutes/day without symptoms.   There is associated bilateral hand paresthesias noted with her chest pain otherwise no associated shortness of breath, diaphoresis, nausea, vomiting, dizziness, presyncope, or syncope.  No lower extremity swelling, orthopnea, PND, abdominal distention, or early satiety.  She is currently chest pain-free.  She indicates that she has tried "all of the statins" and is intolerant to them.  She does report taking in 1/8 of a 10 mg Zetia tablet once per week in the setting of her hyperlipidemia and even this needs to diffuse myalgias, arthralgias, toenail pain, hair pain, "you name it, it hurts."  Patient notes she is under significant amount of stress at home with one child going through a divorce, another child has lost his home and slept in his car last night, her mother is remodeling her home among other issues.  She wonders if this is playing a role in her symptomology.  Past Medical History:  Diagnosis Date  . Abnormal Pap smear of cervix    h/o LEEP  . Allergy   . Angio-edema   . Anxiety   . Asthma   . Cancer ([place])    melanoma 2003  . Constipation   . Depression   . Diarrhea   . Hypertension   . Low back pain   . Melanoma ([place])    right foot s/p lymph node removed right groin  . Obesity (BMI 30-39.9)   . Urticaria   . Vaginal delivery    X 2    Past Surgical History:  Procedure Laterality Date  . BLADDER SUSPENSION  07/2017  . COLONOSCOPY WITH PROPOFOL N/A 07/01/2017   Procedure: COLONOSCOPY WITH PROPOFOL;  Surgeon: Bonna Gains,  Lennette Bihari, MD;  Location: [facility] ENDOSCOPY;  Service: Endoscopy;  Laterality: N/A;  . ESOPHAGOGASTRODUODENOSCOPY (EGD) WITH PROPOFOL N/A 07/01/2017   Procedure: ESOPHAGOGASTRODUODENOSCOPY (EGD) WITH PROPOFOL;  Surgeon: Virgel Manifold, MD;  Location: [facility] ENDOSCOPY;  Service: Endoscopy;  Laterality: N/A;  . LEEP  1995  . LEEP    . PARTIAL HYSTERECTOMY  08/2017   s/p partial hysterectomy - per patient cervix, uterus, fallopian tubes; ovaries INTACT  . SKIN  CANCER EXCISION     melanoma  . TONSILLECTOMY      Current Meds  Medication Sig  . ACCU-CHEK AVIVA PLUS test strip USE UP TO FOUR TIMES DAILY AS DIRECTED  . ACCU-CHEK SOFTCLIX LANCETS lancets TEST BLOOD SUGAR UP TO FOUR TIMES DAILY AS DIRECTED  . ACETAMINOPHEN PO Take by mouth as needed.    Marland Kitchen albuterol (PROVENTIL HFA;VENTOLIN HFA) 108 (90 Base) MCG/ACT inhaler Inhale 2 puffs into the lungs every 6 (six) hours as needed.  Marland Kitchen albuterol (PROVENTIL) (2.5 MG/3ML) 0.083% nebulizer solution Take 3 mLs (2.5 mg total) by nebulization every 6 (six) hours as needed for wheezing or shortness of breath.  Marland Kitchen aspirin EC 81 MG tablet Take 81 mg by mouth daily.  . B Complex Vitamins (BL VITAMIN B COMPLEX PO) Place 5,000 Film under the tongue daily.  . Blood Glucose Monitoring Suppl (ACCU-CHEK AVIVA PLUS) w/Device KIT USE UP TO FOUR TIMES DAILY AS DIRECTED  . cholecalciferol (VITAMIN D) 400 units TABS tablet Take 1,000 Units by mouth.   . clorazepate (TRANXENE) 7.5 MG tablet Take 7.5 mg by mouth 3 (three) times daily.  . fluticasone (FLONASE) 50 MCG/ACT nasal spray Place 2 sprays into both nostrils daily.  . fluticasone (FLOVENT HFA) 110 MCG/ACT inhaler Inhale 2 puffs into the lungs 2 (two) times daily.  Marland Kitchen loratadine (CLARITIN) 10 MG tablet TAKE ONE (1) TABLET BY MOUTH EVERY DAY  . magnesium hydroxide (MILK OF MAGNESIA) 400 MG/5ML suspension Take by mouth daily as needed for mild constipation.  . Multiple Vitamin (MULTIVITAMIN) tablet Take 1 tablet by mouth daily.  . Omega-3 Fatty Acids (FISH OIL) 1000 MG CAPS Take 1 capsule by mouth daily.  Marland Kitchen omeprazole (PRILOSEC) 20 MG capsule TAKE 1 CAPSULE DAILY.  Marland Kitchen polyethylene glycol powder (MIRALAX) powder Take 1 Container by mouth daily.  . propranolol (INDERAL) 20 MG tablet Take 1 tablet (20 mg total) by mouth 3 (three) times daily.  . psyllium (METAMUCIL) 58.6 % powder Take 1 packet by mouth daily.  . sertraline (ZOLOFT) 100 MG tablet TAKE 1+1/2 TABLETS BY MOUTH  DAILY AS DIRECTED  . Spacer/Aero Chamber Mouthpiece MISC 1 puff by Does not apply route as needed.    Allergies:   Pork allergy; Pork-derived products; Sulfur; Aciphex [rabeprazole sodium]; Antivert [meclizine hcl]; Buspar [buspirone hcl]; Cat hair extract; Chlorpheniramine-pseudoeph; Chlorpheniramine-pseudoeph; Chocolate flavor; Codeine; Crestor [rosuvastatin calcium]; Cymbalta [duloxetine hcl]; Duloxetine; Effexor xr [venlafaxine hydrochloride]; Epinephrine; Erythromycin; Escitalopram oxalate; Lamictal [lamotrigine]; Levofloxacin; Lipitor [atorvastatin]; Meclizine; Mometasone furoate; Mometasone furoate; Monosodium glutamate; Neurontin [gabapentin]; Other; Prednisone; Shellfish allergy; Sudafed [pseudoephedrine hcl]; Sulfa antibiotics; Sulfasalazine; Valium; Wellbutrin [bupropion hcl]; Xanax xr [alprazolam]; Alfalfa; Brompheniramine-phenylephrine; Buspirone; and Tape   Social History:  The patient  reports that she quit smoking about 10 years ago. Her smoking use included cigarettes. She has a 20.00 pack-year smoking history. She has never used smokeless tobacco. She reports that she does not drink alcohol or use drugs.   Family History:  The patient's family history includes Breast cancer (age of onset: 1) in her mother; Cancer in  her father, maternal grandmother, and paternal grandfather; Depression in her father and maternal grandfather; Diabetes in her mother; Heart disease in her maternal grandfather; Hyperlipidemia in her brother, mother, and sister; Hypertension in her brother, mother, and sister; Migraines in her brother and sister.  ROS:   Review of Systems  Constitutional: Positive for malaise/fatigue. Negative for chills, diaphoresis, fever and weight loss.  HENT: Negative for congestion.   Eyes: Negative for discharge and redness.  Respiratory: Positive for cough, shortness of breath and wheezing. Negative for hemoptysis and sputum production.   Cardiovascular: Positive for chest pain.  Negative for palpitations, orthopnea, claudication, leg swelling and PND.  Gastrointestinal: Negative for abdominal pain, blood in stool, diarrhea, heartburn, melena, nausea and vomiting.  Genitourinary: Negative for hematuria.  Musculoskeletal: Negative for falls and myalgias.  Skin: Negative for rash.  Neurological: Positive for sensory change and weakness. Negative for dizziness, tingling, tremors, speech change, focal weakness and loss of consciousness.  Endo/Heme/Allergies: Does not bruise/bleed easily.  Psychiatric/Behavioral: Negative for substance abuse. The patient is nervous/anxious.   All other systems reviewed and are negative.    PHYSICAL EXAM:  VS:  BP 130/80 (BP Location: Left Arm, Patient Position: Sitting, Cuff Size: Normal)   Pulse (!) 59   Ht _0  (1.575 m)   Wt 227 lb (103 kg)   LMP  (LMP Unknown) Comment: urine Preg negative   BMI 41.52 kg/m  BMI: Body mass index is 41.52 kg/m.  Physical Exam  Constitutional: She is oriented to person, place, and time. She appears well-developed and well-nourished.  HENT:  Head: Normocephalic and atraumatic.  Eyes: Right eye exhibits no discharge. Left eye exhibits no discharge.  Neck: Normal range of motion. No JVD present.  Cardiovascular: Normal rate, regular rhythm, S1 normal and S2 normal. Exam reveals no distant heart sounds, no friction rub, no midsystolic click and no opening snap.  Murmur heard. High-pitched blowing holosystolic murmur is present with a grade of 1/6 at the apex. Pulses:      Posterior tibial pulses are 2+ on the right side and 2+ on the left side.  Pulmonary/Chest: Effort normal and breath sounds normal. No respiratory distress. She has no decreased breath sounds. She has no wheezes. She has no rales. She exhibits no tenderness.  Abdominal: Soft. She exhibits no distension. There is no abdominal tenderness.  Musculoskeletal:        General: No edema.  Neurological: She is alert and oriented to  person, place, and time.  Skin: Skin is warm and dry. No cyanosis. Nails show no clubbing.  Psychiatric: She has a normal mood and affect. Her speech is normal and behavior is normal. Judgment and thought content normal.     EKG:  Was ordered and interpreted by me today. Shows NSR, 60 bpm, normal axis, baseline tremor artifact noted, no acute ST-T changes  Recent Labs: 05/02/2018: ALT 33; BUN 14; Creatinine, Ser 0.69; Hemoglobin 14.2; Platelets 175.0; Potassium 4.2; Sodium 136; TSH 2.31  05/02/2018: Cholesterol 233; Direct LDL 141.0; HDL 29.60; Total CHOL/HDL Ratio 8; Triglycerides 360.0; VLDL 72.0   CrCl cannot be calculated (Patient's most recent lab result is older than the maximum 21 days allowed.).   Wt Readings from Last 3 Encounters:  06/09/18 227 lb (103 kg)  05/23/18 227 lb 6.4 oz (103.1 kg)  05/02/18 231 lb 12.8 oz (105.1 kg)     Other studies reviewed: Additional studies/records reviewed today include: summarized above  ASSESSMENT AND PLAN:  1. Chest pain with moderate  risk for cardiac etiology: Recent Myoview in 2017 nonischemic.  Now notes exertional chest pain.  Schedule cardiac CT with FFR.  Continue aspirin and propanolol.  Unable to add statin therapy for further risk factor reduction secondary to numerous intolerances.  Cannot exclude stress at home playing a role.  2. Mitral regurgitation: Mild to moderate mitral regurgitation was noted on echo in 2017.  Schedule echocardiogram.  3. Hypertension: Blood pressure blood pressure is reasonably controlled today.  Currently taking propanolol 3 times daily for anxiety.  Continue this for her hypertension as well.  4. Hyperlipidemia: Intolerant to atorvastatin secondary to weight gain and Crestor for uncertain reasons.  Most recent LDL of 141 from 04/2018.  5. Obesity: Weight loss advised.  6. Multiple medication intolerances: Ultimately, continuing medical therapy may be somewhat difficult/limited in her secondary to  her numerous intolerances.  Disposition: F/u with first available [facility] [place] physician to reestablish care.  Current medicines are reviewed at length with the patient today.  The patient did not have any concerns regarding medicines.  Signed, Christell Faith, PA-C 06/09/2018 1:54 PM     [address] [place], [place] [address] [phone]

## 2018-06-09 ENCOUNTER — Encounter: Payer: Self-pay | Admitting: Physician Assistant

## 2018-06-09 ENCOUNTER — Ambulatory Visit: Payer: Medicare HMO | Admitting: Physician Assistant

## 2018-06-09 VITALS — BP 130/80 | HR 59 | Ht 62.0 in | Wt 227.0 lb

## 2018-06-09 DIAGNOSIS — I10 Essential (primary) hypertension: Secondary | ICD-10-CM

## 2018-06-09 DIAGNOSIS — R079 Chest pain, unspecified: Secondary | ICD-10-CM

## 2018-06-09 DIAGNOSIS — I340 Nonrheumatic mitral (valve) insufficiency: Secondary | ICD-10-CM

## 2018-06-09 DIAGNOSIS — Z789 Other specified health status: Secondary | ICD-10-CM

## 2018-06-09 DIAGNOSIS — E782 Mixed hyperlipidemia: Secondary | ICD-10-CM

## 2018-06-09 DIAGNOSIS — Z6841 Body Mass Index (BMI) 40.0 and over, adult: Secondary | ICD-10-CM | POA: Diagnosis not present

## 2018-06-09 NOTE — Patient Instructions (Addendum)
Medication Instructions:  Your physician recommends that you continue on your current medications as directed. Please refer to the Current Medication list given to you today.  If you need a refill on your cardiac medications before your next appointment, please call your pharmacy.   Lab work: Your physician recommends that you return for lab work (within 30 days of cardiac CT). Order is placed and needs no appt. Please go to medical mall within 30 days for lab draw.   If you have labs (blood work) drawn today and your tests are completely normal, you will receive your results only by: Marland Kitchen MyChart Message (if you have MyChart) OR . A paper copy in the mail If you have any lab test that is abnormal or we need to change your treatment, we will call you to review the results.  Testing/Procedures: 1- Cardiac CT  Please arrive at the [facility] main entrance of [facility] at xx:xx AM (30-45 minutes prior to test start time)  [facility] [place], [place] [address] [phone]  Proceed to the [facility] Radiology Department (First Floor).  Please follow these instructions carefully (unless otherwise directed):  Hold all erectile dysfunction medications at least 48 hours prior to test.  On the Night Before the Test: . Be sure to Drink plenty of water. . Do not consume any caffeinated/decaffeinated beverages or chocolate 12 hours prior to your test. . Do not take any antihistamines 12 hours prior to your test. . If you take Metformin do not take 24 hours prior to test. . If the patient has contrast allergy: ? Patient will need a prescription for Prednisone and very clear instructions (as follows): 1. Prednisone 50 mg - take 13 hours prior to test 2. Take another Prednisone 50 mg 7 hours prior to test 3. Take another Prednisone 50 mg 1 hour prior to test 4. Take Benadryl 50 mg 1 hour prior to test . Patient must complete all four doses of above  prophylactic medications. . Patient will need a ride after test due to Benadryl.  On the Day of the Test: . Drink plenty of water. Do not drink any water within one hour of the test. . Do not eat any food 4 hours prior to the test. . You may take your regular medications prior to the test.  . Take propranolol two hours prior to test.      After the Test: . Drink plenty of water. . After receiving IV contrast, you may experience a mild flushed feeling. This is normal. . On occasion, you may experience a mild rash up to 24 hours after the test. This is not dangerous. If this occurs, you can take Benadryl 25 mg and increase your fluid intake. . If you experience trouble breathing, this can be serious. If it is severe call 911 IMMEDIATELY. If it is mild, please call our office. . If you take any of these medications: Glipizide/Metformin, Avandament, Glucavance, please do not take 48 hours after completing test. 2- Echo Your physician has requested that you have an echocardiogram. Echocardiography is a painless test that uses sound waves to create images of your heart. It provides your doctor with information about the size and shape of your heart and how well your heart's chambers and valves are working. This procedure takes approximately one hour. There are no restrictions for this procedure.    Follow-Up: At [facility], you and your health needs are our priority.  As  part of our continuing mission to provide you with exceptional heart care, we have created designated Provider Care Teams.  These Care Teams include your primary Cardiologist (physician) and Advanced Practice Providers (APPs -  Physician Assistants and Nurse Practitioners) who all work together to provide you with the care you need, when you need it. You will need a follow up appointment in 3 months. You may see establish care with a primary MD.

## 2018-06-13 ENCOUNTER — Emergency Department
Admission: EM | Admit: 2018-06-13 | Discharge: 2018-06-13 | Disposition: A | Discharge status: Home / Self Care | Payer: Medicare HMO | Attending: Emergency Medicine | Admitting: Emergency Medicine

## 2018-06-13 ENCOUNTER — Ambulatory Visit: Payer: Self-pay

## 2018-06-13 ENCOUNTER — Encounter: Payer: Self-pay | Admitting: Emergency Medicine

## 2018-06-13 ENCOUNTER — Emergency Department: Payer: Medicare HMO

## 2018-06-13 ENCOUNTER — Other Ambulatory Visit: Payer: Self-pay

## 2018-06-13 DIAGNOSIS — Z79899 Other long term (current) drug therapy: Secondary | ICD-10-CM | POA: Diagnosis not present

## 2018-06-13 DIAGNOSIS — Z87891 Personal history of nicotine dependence: Secondary | ICD-10-CM | POA: Insufficient documentation

## 2018-06-13 DIAGNOSIS — J45909 Unspecified asthma, uncomplicated: Secondary | ICD-10-CM | POA: Insufficient documentation

## 2018-06-13 DIAGNOSIS — R079 Chest pain, unspecified: Secondary | ICD-10-CM | POA: Diagnosis not present

## 2018-06-13 DIAGNOSIS — R0789 Other chest pain: Secondary | ICD-10-CM | POA: Diagnosis not present

## 2018-06-13 DIAGNOSIS — R071 Chest pain on breathing: Secondary | ICD-10-CM | POA: Insufficient documentation

## 2018-06-13 DIAGNOSIS — Z85828 Personal history of other malignant neoplasm of skin: Secondary | ICD-10-CM | POA: Diagnosis not present

## 2018-06-13 DIAGNOSIS — Z7982 Long term (current) use of aspirin: Secondary | ICD-10-CM | POA: Diagnosis not present

## 2018-06-13 LAB — COMPREHENSIVE METABOLIC PANEL
ALK PHOS: 58 U/L (ref 38–126)
ALT: 37 U/L (ref 0–44)
ANION GAP: 6 (ref 5–15)
AST: 32 U/L (ref 15–41)
Albumin: 4.8 g/dL (ref 3.5–5.0)
BUN: 15 mg/dL (ref 6–20)
CO2: 28 mmol/L (ref 22–32)
Calcium: 9.1 mg/dL (ref 8.9–10.3)
Chloride: 104 mmol/L (ref 98–111)
Creatinine, Ser: 0.61 mg/dL (ref 0.44–1.00)
GFR calc Af Amer: >60 mL/min (ref >60)
GFR calc non Af Amer: >60 mL/min (ref >60)
Glucose, Bld: 106 mg/dL — ABNORMAL HIGH (ref 70–99)
Potassium: 4.1 mmol/L (ref 3.5–5.1)
Sodium: 138 mmol/L (ref 135–145)
Total Bilirubin: 0.7 mg/dL (ref 0.3–1.2)
Total Protein: 7.7 g/dL (ref 6.5–8.1)

## 2018-06-13 LAB — CBC
HCT: 43 % (ref 36.0–46.0)
Hemoglobin: 14.4 g/dL (ref 12.0–15.0)
MCH: 28.5 pg (ref 26.0–34.0)
MCHC: 33.5 g/dL (ref 30.0–36.0)
MCV: 85 fL (ref 80.0–100.0)
Platelets: 164 10*3/uL (ref 150–400)
RBC: 5.06 MIL/uL (ref 3.87–5.11)
RDW: 13.6 % (ref 11.5–15.5)
WBC: 7 10*3/uL (ref 4.0–10.5)
nRBC: 0 % (ref 0.0–0.2)

## 2018-06-13 LAB — TROPONIN I: Troponin I: <0.03 ng/mL (ref <0.03)

## 2018-06-13 MED ORDER — KETOROLAC TROMETHAMINE 30 MG/ML IJ SOLN
30.0000 mg | Freq: Once | INTRAMUSCULAR | Status: AC
  Administered 2018-06-13: 30 mg via INTRAMUSCULAR
  Filled 2018-06-13: qty 1

## 2018-06-13 MED ORDER — SODIUM CHLORIDE 0.9% FLUSH: 3.0000 mL | Freq: Once | INTRAVENOUS | Status: DC

## 2018-06-13 MED ORDER — NAPROXEN 500 MG PO TABS: 500.0000 mg | ORAL_TABLET | Freq: Two times a day (BID) | ORAL | 2 refills | Status: DC

## 2018-06-13 NOTE — Telephone Encounter (Signed)
FYI patient going to ED

## 2018-06-13 NOTE — ED Provider Notes (Signed)
[facility] Emergency Department Provider Note   ____________________________________________    I have reviewed the triage vital signs and the nursing notes.   HISTORY  Chief Complaint Chest Pain     HPI Brittney Ruiz is a 53 y.o. female who complains of 1 week of chest discomfort.  She describes it as an aching pain to the left of her sternum above her breast.  She reports it is worse with pressure and sometimes with a very deep breath.  She denies fevers or chills.  No rash or injury to the area.  Has not taken anything for this.  Has seen cardiology in the past and was told that she had a leaky valve.  Does actually have follow-up with cardiology and has an echo scheduled as well.  No recent travel.  No calf pain or swelling.   Past Medical History:  Diagnosis Date  . Abnormal Pap smear of cervix    h/o LEEP  . Allergy   . Angio-edema   . Anxiety   . Asthma   . Cancer ([place])    melanoma 2003  . Constipation   . Depression   . Diarrhea   . Hypertension   . Low back pain   . Melanoma ([place])    right foot s/p lymph node removed right groin  . Obesity (BMI 30-39.9)   . Urticaria   . Vaginal delivery    X 2    Patient Active Problem List   Diagnosis Date Noted  . Exacerbation of asthma 05/02/2018  . Left foot pain 05/02/2018  . Chronic midline low back pain 10/13/2017  . Prolapse of female genital organs 10/05/2017  . Uterovaginal prolapse 08/17/2017  . Special screening for malignant neoplasms, colon   . Polyp of sigmoid colon   . Internal hemorrhoids   . Diverticulosis of large intestine without diverticulitis   . Heartburn   . Stomach irritation   . Gastric polyp   . Esophageal thickening   . Chronic constipation 08/20/2016  . Bronchitis 08/20/2016  . Routine physical examination 04/20/2016  . Lesion of cervix 04/20/2016  . Mitral valve regurgitation 09/17/2015  . Dyspnea 08/26/2015  . Pain in the chest 08/09/2015  . Breast  lesion 08/09/2015  . Deflected nasal septum 01/05/2013  . Acid reflux 12/20/2012  . Allergic rhinitis due to pollen 12/05/2012  . Chronic infection of sinus 12/04/2012  . Malignant melanoma of foot ([place]) 11/30/2012  . Agoraphobia with panic disorder 10/12/2012  . Menorrhagia 08/12/2012  . HLD (hyperlipidemia) 08/12/2012  . Medicare annual wellness visit, subsequent 04/21/2012  . Depression   . Anxiety   . Obesity (BMI 30-39.9)     Past Surgical History:  Procedure Laterality Date  . BLADDER SUSPENSION  07/2017  . COLONOSCOPY WITH PROPOFOL N/A 07/01/2017   Procedure: COLONOSCOPY WITH PROPOFOL;  Surgeon: Virgel Manifold, MD;  Location: [facility] ENDOSCOPY;  Service: Endoscopy;  Laterality: N/A;  . ESOPHAGOGASTRODUODENOSCOPY (EGD) WITH PROPOFOL N/A 07/01/2017   Procedure: ESOPHAGOGASTRODUODENOSCOPY (EGD) WITH PROPOFOL;  Surgeon: Virgel Manifold, MD;  Location: [facility] ENDOSCOPY;  Service: Endoscopy;  Laterality: N/A;  . LEEP  1995  . LEEP    . PARTIAL HYSTERECTOMY  08/2017   s/p partial hysterectomy - per patient cervix, uterus, fallopian tubes; ovaries INTACT  . SKIN CANCER EXCISION     melanoma  . TONSILLECTOMY      Prior to Admission medications   Medication Sig Start Date End Date Taking? Authorizing Provider  ACCU-CHEK AVIVA PLUS test strip USE UP TO FOUR TIMES DAILY AS DIRECTED 02/09/18   Burnard Hawthorne, FNP  ACCU-CHEK SOFTCLIX LANCETS lancets TEST BLOOD SUGAR UP TO FOUR TIMES DAILY AS DIRECTED 02/08/18   Burnard Hawthorne, FNP  ACETAMINOPHEN PO Take by mouth as needed.      [provider]  albuterol (PROVENTIL HFA;VENTOLIN HFA) 108 (90 Base) MCG/ACT inhaler Inhale 2 puffs into the lungs every 6 (six) hours as needed. 05/28/16   Burnard Hawthorne, FNP  albuterol (PROVENTIL) (2.5 MG/3ML) 0.083% nebulizer solution Take 3 mLs (2.5 mg total) by nebulization every 6 (six) hours as needed for wheezing or shortness of breath. 01/03/18   Jodelle Green, FNP  aspirin EC 81  MG tablet Take 81 mg by mouth daily.    [provider]  B Complex Vitamins (BL VITAMIN B COMPLEX PO) Place 5,000 Film under the tongue daily. 04/13/17   [provider]  Blood Glucose Monitoring Suppl (ACCU-CHEK AVIVA PLUS) w/Device KIT USE UP TO FOUR TIMES DAILY AS DIRECTED 03/02/18   Burnard Hawthorne, FNP  cholecalciferol (VITAMIN D) 400 units TABS tablet Take 1,000 Units by mouth.     [provider]  clorazepate (TRANXENE) 7.5 MG tablet Take 7.5 mg by mouth 3 (three) times daily.    [provider]  fluticasone (FLONASE) 50 MCG/ACT nasal spray Place 2 sprays into both nostrils daily. 10/12/17   Kozlow, Donnamarie Poag, MD  fluticasone (FLOVENT HFA) 110 MCG/ACT inhaler Inhale 2 puffs into the lungs 2 (two) times daily. 05/02/18   Burnard Hawthorne, FNP  loratadine (CLARITIN) 10 MG tablet TAKE ONE (1) TABLET BY MOUTH EVERY DAY 01/07/15   Rubbie Battiest, RN  magnesium hydroxide (MILK OF MAGNESIA) 400 MG/5ML suspension Take by mouth daily as needed for mild constipation.    [provider]  Multiple Vitamin (MULTIVITAMIN) tablet Take 1 tablet by mouth daily.    [provider]  naproxen (NAPROSYN) 500 MG tablet Take 1 tablet (500 mg total) by mouth 2 (two) times daily with a meal. 06/13/18   Lavonia Drafts, MD  Omega-3 Fatty Acids (FISH OIL) 1000 MG CAPS Take 1 capsule by mouth daily.    [provider]  omeprazole (PRILOSEC) 20 MG capsule TAKE 1 CAPSULE DAILY. 04/10/16   Burnard Hawthorne, FNP  polyethylene glycol powder (MIRALAX) powder Take 1 Container by mouth daily.    [provider]  propranolol (INDERAL) 20 MG tablet Take 1 tablet (20 mg total) by mouth 3 (three) times daily. 07/16/15   Jackolyn Confer, MD  psyllium (METAMUCIL) 58.6 % powder Take 1 packet by mouth daily.    [provider]  sertraline (ZOLOFT) 100 MG tablet TAKE 1+1/2 TABLETS BY MOUTH DAILY AS DIRECTED 06/14/14   Jackolyn Confer, MD  Spacer/Aero  Chamber Mouthpiece MISC 1 puff by Does not apply route as needed. 03/27/11   Jackolyn Confer, MD     Allergies Pork allergy; Pork-derived products; Sulfur; Aciphex [rabeprazole sodium]; Antivert [meclizine hcl]; Buspar [buspirone hcl]; Cat hair extract; Chlorpheniramine-pseudoeph; Chlorpheniramine-pseudoeph; Chocolate flavor; Codeine; Crestor [rosuvastatin calcium]; Cymbalta [duloxetine hcl]; Duloxetine; Effexor xr [venlafaxine hydrochloride]; Epinephrine; Erythromycin; Escitalopram oxalate; Lamictal [lamotrigine]; Levofloxacin; Lipitor [atorvastatin]; Meclizine; Mometasone furoate; Mometasone furoate; Monosodium glutamate; Neurontin [gabapentin]; Other; Prednisone; Shellfish allergy; Sudafed [pseudoephedrine hcl]; Sulfa antibiotics; Sulfasalazine; Valium; Wellbutrin [bupropion hcl]; Xanax xr [alprazolam]; Alfalfa; Brompheniramine-phenylephrine; Buspirone; and Tape  Family History  Problem Relation Age of Onset  . Hyperlipidemia Mother   . Hypertension  Mother   . Diabetes Mother   . Breast cancer Mother 39  . Cancer Father        lung  . Depression Father   . Hyperlipidemia Sister   . Hypertension Sister   . Migraines Sister   . Hypertension Brother   . Hyperlipidemia Brother   . Migraines Brother   . Cancer Maternal Grandmother        brain  . Depression Maternal Grandfather   . Heart disease Maternal Grandfather   . Cancer Paternal Grandfather        unknown    Social History Social History   Tobacco Use  . Smoking status: Former Smoker    Packs/day: 1.00    Years: 20.00    Pack years: 20.00    Types: Cigarettes    Last attempt to quit: 03/09/2008    Years since quitting: 10.2  . Smokeless tobacco: Never Used  Substance Use Topics  . Alcohol use: No  . Drug use: No    Review of Systems  Constitutional: No fever/chills Eyes: No visual changes.  ENT: No neck pain Cardiovascular: As above Respiratory: Denies shortness of breath. Gastrointestinal: No abdominal  pain.  No nausea, no vomiting.   Genitourinary: Negative for dysuria. Musculoskeletal: Negative for back pain. Skin: Negative for rash. Neurological: Negative for headaches   ____________________________________________   PHYSICAL EXAM:  VITAL SIGNS: ED Triage Vitals [06/13/18 1609]  Enc Vitals Group     BP (!) 141/84     Pulse Rate 67     Resp 20     Temp 98.2 F (36.8 C)     Temp Source Oral     SpO2 95 %     Weight 103 kg (227 lb)     Height 1.575 m (_0 )     Head Circumference      Peak Flow      Pain Score 6     Pain Loc      Pain Edu?      Excl. in [place]?     Constitutional: Alert and oriented. Pleasant and interactive  Nose: No congestion/rhinnorhea. Mouth/Throat: Mucous membranes are moist.    Cardiovascular: Normal rate, regular rhythm. Grossly normal heart sounds.  Good peripheral circulation.  Chest wall tenderness inferolateral left sternal border which mimics her pain, no rash or erythema or bruising Respiratory: Normal respiratory effort.  No retractions. Lungs CTAB. Gastrointestinal: Soft and nontender. No distention.  Musculoskeletal: No lower extremity tenderness nor edema.  Warm and well perfused Neurologic:  Normal speech and language. No gross focal neurologic deficits are appreciated.  Skin:  Skin is warm, dry and intact. No rash noted. Psychiatric: Mood and affect are normal. Speech and behavior are normal.  ____________________________________________   LABS (all labs ordered are listed, but only abnormal results are displayed)  Labs Reviewed  COMPREHENSIVE METABOLIC PANEL - Abnormal; Notable for the following components:      Result Value   Glucose, Bld 106 (*)    All other components within normal limits  CBC  TROPONIN I  POC URINE PREG, ED   ____________________________________________  EKG  ED ECG REPORT I, Lavonia Drafts, the attending physician, personally viewed and interpreted this ECG.  Date: 06/13/2018  Rhythm: normal  sinus rhythm QRS Axis: normal Intervals: normal ST/T Wave abnormalities: normal Narrative Interpretation: no evidence of acute ischemia  ____________________________________________  RADIOLOGY  No acute distress on chest x-ray ____________________________________________   PROCEDURES  Procedure(s) performed: No  Procedures   Critical  Care performed: No ____________________________________________   INITIAL IMPRESSION / ASSESSMENT AND PLAN / ED COURSE  Pertinent labs & imaging results that were available during my care of the patient were reviewed by me and considered in my medical decision making (see chart for details).  Patient presents with chest pain as above, lab work is greatly reassuring, normal EKG.  Not consistent with ACS dissection PE or myocarditis.  Given tenderness along the sternal border suspect costochondritis/chest wall pain.  Treated with IM Toradol with significant improvement, will discharge on Naprosyn, outpatient follow-up with cardiology, strict return precautions discussed    ____________________________________________   FINAL CLINICAL IMPRESSION(S) / ED DIAGNOSES  Final diagnoses:  Chest wall pain        Note:  This document was prepared using Dragon voice recognition software and may include unintentional dictation errors.   Lavonia Drafts, MD 06/13/18 2154

## 2018-06-13 NOTE — ED Notes (Signed)
Patient ambulatory to restroom at this time.  Will continue to monitor.

## 2018-06-13 NOTE — Telephone Encounter (Signed)
Pt. called to report intermittent, dull chest pain x one week in left anterior chest, and has moved into right anterior chest.  Stated the "dull pain" has been present in left chest since about 7:45 AM today, and in right chest for the past 1.5 hrs.  Reported it is "deep in the breast area, bilaterally.  Reported she has "some shortness of breath with activity."  Denied radiation of pain.  Reported she walks on the treadmill daily, and has onset of the chest pain after about 15-18 min. of walking; has not walked on TM today.  Stated she broke out in cold sweat yesterday.  Denied any sweating or nausea today.  Stated her upper teeth hurt last night.  Denied any jaw or teeth pain today.  Stated "I know that I have anxiety, but this is different than my anxiety attacks."    Attempted to call [facility] in [place]; on hold x 10 min., with no answer.  Advised pt. with current symptoms, this nurse recommends that she go to the ER.  Pt. stated she will need to contact husband to make arrangements to get to the hosp., due to taking care of her grandchildren.  Advised if chest pain or shortness of breath worsens, she should call 911.  Verb. Understanding.       Reason for Disposition . Chest pain lasts > 5 minutes (Exceptions: chest pain occurring > 3 days ago and now asymptomatic; same as previously diagnosed heartburn and has accompanying sour taste in mouth)    Reported dull left chest pain since 7:45 AM this morning;  Now reported the pain is in right chest over past 1.5 hr.  Reported intermittent numbness/ tingling of left arm. C/o "some shortness of breath."  Answer Assessment - Initial Assessment Questions 1. LOCATION: "Where does it hurt?"       Dull pain deep beneathe the bilat. Breast area    2. RADIATION: "Does the pain go anywhere else?" (e.g., into neck, jaw, arms, back)     Denied radiation of pain  3. ONSET: "When did the chest pain begin?" (Minutes, hours or days)      About a week ago 4.  PATTERN "Does the pain come and go, or has it been constant since it started?"  "Does it get worse with exertion?"      Intermittent; episodes are lasting longer.   5. DURATION: "How long does it last" (e.g., seconds, minutes, hours)     It varies; today it started since 7:45 AM in left side; right side pain has lasted about 1.5 hrs.   6. SEVERITY: "How bad is the pain?"  (e.g., Scale 1-10; mild, moderate, or severe)    - MILD (1-3): doesn't interfere with normal activities     - MODERATE (4-7): interferes with normal activities or awakens from sleep    - SEVERE (8-10): excruciating pain, unable to do any normal activities       4-5/10  7. CARDIAC RISK FACTORS: "Do you have any history of heart problems or risk factors for heart disease?" (e.g., prior heart attack, angina; high blood pressure, diabetes, being overweight, high cholesterol, smoking, or strong family history of heart disease)     Positive for HTN, overweight, hyperlipidemia; father with MI hx.  8. PULMONARY RISK FACTORS: "Do you have any history of lung disease?"  (e.g., blood clots in lung, asthma, emphysema, birth control pills)     *No Answer* 9. CAUSE: "What do you think is causing the chest  pain?"     Thinks related to her heart  10. OTHER SYMPTOMS: "Do you have any other symptoms?" (e.g., dizziness, nausea, vomiting, sweating, fever, difficulty breathing, cough)       Tingling in left arm/hand that comes and goes ; reported the upper teeth were hurting last night; some shortness of breath with activity; denied nausea or sweating at present; admitted to having a cold sweat yesterday.    11. PREGNANCY: "Is there any chance you are pregnant?" "When was your last menstrual period?"       Had hysterectomy  Protocols used: CHEST PAIN-A-AH

## 2018-06-13 NOTE — ED Triage Notes (Signed)
L chest pain x 1 week, R chest pain today.

## 2018-06-14 ENCOUNTER — Other Ambulatory Visit: Payer: Medicare HMO

## 2018-06-14 NOTE — Telephone Encounter (Signed)
Seen in ed 

## 2018-06-21 ENCOUNTER — Other Ambulatory Visit: Payer: Self-pay | Admitting: Podiatry

## 2018-06-21 ENCOUNTER — Encounter: Payer: Self-pay | Admitting: Podiatry

## 2018-06-21 ENCOUNTER — Ambulatory Visit (INDEPENDENT_AMBULATORY_CARE_PROVIDER_SITE_OTHER): Payer: Medicare HMO

## 2018-06-21 ENCOUNTER — Ambulatory Visit: Payer: Medicare HMO | Admitting: Podiatry

## 2018-06-21 VITALS — BP 135/86 | HR 70

## 2018-06-21 DIAGNOSIS — M722 Plantar fascial fibromatosis: Secondary | ICD-10-CM | POA: Diagnosis not present

## 2018-06-21 DIAGNOSIS — M779 Enthesopathy, unspecified: Secondary | ICD-10-CM

## 2018-06-21 DIAGNOSIS — L600 Ingrowing nail: Secondary | ICD-10-CM

## 2018-06-21 DIAGNOSIS — M79671 Pain in right foot: Secondary | ICD-10-CM

## 2018-06-21 DIAGNOSIS — M79672 Pain in left foot: Principal | ICD-10-CM

## 2018-06-21 DIAGNOSIS — M778 Other enthesopathies, not elsewhere classified: Secondary | ICD-10-CM

## 2018-06-21 NOTE — Patient Instructions (Signed)

## 2018-06-24 NOTE — Progress Notes (Signed)
Subjective: Patient presents today for evaluation of pain to the medial border of the bilateral great toes that has been ongoing for the past few years. Patient is concerned for possible ingrown nail and states she has had a nail avulsion procedure in the past. She has not done anything for treatment at home.  She also reports sharp, shooting pain to the arch and plantar heel of the bilateral feet that has worsened in the past two months. She reports associated tingling of the feet. Walking increases the pain. She has been taking Motrin for treatment. Patient presents today for further treatment and evaluation.  Past Medical History:  Diagnosis Date  . Abnormal Pap smear of cervix    h/o LEEP  . Allergy   . Angio-edema   . Anxiety   . Asthma   . Cancer ([place])    melanoma 2003  . Constipation   . Depression   . Diarrhea   . Hypertension   . Low back pain   . Melanoma ([place])    right foot s/p lymph node removed right groin  . Obesity (BMI 30-39.9)   . Urticaria   . Vaginal delivery    X 2    Objective:  General: Well developed, nourished, in no acute distress, alert and oriented x3   Dermatology: Skin is warm, dry and supple bilateral. Medial borders of bilateral great toes appears to be erythematous with evidence of an ingrowing nail. Pain on palpation noted to the border of the nail fold. The remaining nails appear unremarkable at this time. There are no open sores, lesions.  Vascular: Dorsalis Pedis artery and Posterior Tibial artery pedal pulses palpable. No lower extremity edema noted.   Neruologic: Grossly intact via light touch bilateral.  Musculoskeletal: Tenderness to palpation to the plantar aspect of the left heel along the plantar fascia. Muscular strength within normal limits in all groups bilateral. Normal range of motion noted to all pedal and ankle joints.  Radiographic Exam:  Normal osseous mineralization. Joint spaces preserved. No fracture/dislocation/boney  destruction.     Assesement: #1 Paronychia with ingrowing nail medial borders of the bilateral great toes #2 Plantar fasciitis left   Plan of Care:  1. Patient evaluated. X-Rays reviewed.  2. Discussed treatment alternatives and plan of care. Explained nail avulsion procedure and post procedure course to patient. 3. Patient opted for permanent partial nail avulsion of the medial borders of the bilateral great toes.  4. Prior to procedure, local anesthesia infiltration utilized using 3 ml of a 50:50 mixture of 2% plain lidocaine and 0.5% plain marcaine in a normal hallux block fashion and a betadine prep performed.  5. Partial permanent nail avulsion with chemical matrixectomy performed using [id number] applications of phenol followed by alcohol flush.  6. Light dressing applied. 7. Injection of 0.5 mLs Celestone Soluspan injected into the left heel.  8. Plantar fascial brace dispensed.  9. Night splint dispensed.  10. Continue taking OTC Motrin.  11. Return to clinic in 4 weeks.   Edrick Kins, DPM [facility]  Dr. Edrick Kins, [address]                                        [place], [place] [address]                Office [phone]  Fax [phone]

## 2018-06-28 ENCOUNTER — Ambulatory Visit (INDEPENDENT_AMBULATORY_CARE_PROVIDER_SITE_OTHER): Payer: Medicare HMO

## 2018-06-28 VITALS — BP 122/78 | HR 71 | Temp 98.2°F | Resp 16 | Ht 61.5 in | Wt 225.1 lb

## 2018-06-28 DIAGNOSIS — Z0000 Encounter for general adult medical examination without abnormal findings: Secondary | ICD-10-CM | POA: Diagnosis not present

## 2018-06-28 NOTE — Patient Instructions (Addendum)
  Brittney Ruiz , Thank you for taking time to come for your Medicare Wellness Visit. I appreciate your ongoing commitment to your health goals. Please review the following plan we discussed and let me know if I can assist you in the future.   Return for routine maintenance appointment with your primary care provider.   These are the goals we discussed: Goals    . Increase physical activity     Walk on the treadmill as tolerated       This is a list of the screening recommended for you and due dates:  Health Maintenance  Topic Date Due  . Flu Shot  12/16/2017  . Pap Smear  04/20/2020  . Mammogram  05/02/2020  . Colon Cancer Screening  07/01/2022  . Tetanus Vaccine  04/18/2023  . HIV Screening  Completed

## 2018-06-28 NOTE — Progress Notes (Signed)
Subjective:   Brittney Ruiz is a 53 y.o. female who presents for Medicare Annual (Subsequent) preventive examination.  Review of Systems:  No ROS.  Medicare Wellness Visit. Additional risk factors are reflected in the social history.       Objective:     Vitals: BP 122/78 (BP Location: Left Arm, Patient Position: Sitting, Cuff Size: Normal)   Pulse 71   Temp 98.2 F (36.8 C) (Oral)   Resp 16   Ht 5' 1.5" (1.562 m)   Wt 225 lb 1.9 oz (102.1 kg)   LMP  (LMP Unknown) Comment: urine Preg negative   SpO2 97%   BMI 41.85 kg/m   Body mass index is 41.85 kg/m.  Advanced Directives 06/28/2018 06/13/2018 07/01/2017 06/25/2017 06/24/2016  Does Patient Have a Medical Advance Directive? _0   Would patient like information on creating a medical advance directive? No - Patient declined - No - Patient declined No - Patient declined Yes (MAU/Ambulatory/Procedural Areas - Information given)    Tobacco Social History   Tobacco Use  Smoking Status Former Smoker  . Packs/day: 1.00  . Years: 20.00  . Pack years: 20.00  . Types: Cigarettes  . Last attempt to quit: 03/09/2008  . Years since quitting: 10.3  Smokeless Tobacco Never Used     Counseling given: Not Answered   Clinical Intake:  Pre-visit preparation completed: Yes        Diabetes: No  How often do you need to have someone help you when you read instructions, pamphlets, or other written materials from your doctor or pharmacy?: 1 - Never  Interpreter Needed?: No     Past Medical History:  Diagnosis Date  . Abnormal Pap smear of cervix    h/o LEEP  . Allergy   . Angio-edema   . Anxiety   . Asthma   . Cancer ([place])    melanoma 2003  . Constipation   . Depression   . Diarrhea   . Hypertension   . Low back pain   . Melanoma ([place])    right foot s/p lymph node removed right groin  . Obesity (BMI 30-39.9)   . Urticaria   . Vaginal delivery    X 2   Past Surgical History:  Procedure Laterality Date    . BLADDER SUSPENSION  07/2017  . COLONOSCOPY WITH PROPOFOL N/A 07/01/2017   Procedure: COLONOSCOPY WITH PROPOFOL;  Surgeon: Virgel Manifold, MD;  Location: [facility] ENDOSCOPY;  Service: Endoscopy;  Laterality: N/A;  . ESOPHAGOGASTRODUODENOSCOPY (EGD) WITH PROPOFOL N/A 07/01/2017   Procedure: ESOPHAGOGASTRODUODENOSCOPY (EGD) WITH PROPOFOL;  Surgeon: Virgel Manifold, MD;  Location: [facility] ENDOSCOPY;  Service: Endoscopy;  Laterality: N/A;  . LEEP  1995  . LEEP    . PARTIAL HYSTERECTOMY  08/2017   s/p partial hysterectomy - per patient cervix, uterus, fallopian tubes; ovaries INTACT  . SKIN CANCER EXCISION     melanoma  . TONSILLECTOMY     Family History  Problem Relation Age of Onset  . Hyperlipidemia Mother   . Hypertension Mother   . Diabetes Mother   . Breast cancer Mother 20  . Cancer Father        lung  . Depression Father   . Hyperlipidemia Sister   . Hypertension Sister   . Migraines Sister   . Hypertension Brother   . Hyperlipidemia Brother   . Migraines Brother   . Cancer Maternal Grandmother        brain  .  Depression Maternal Grandfather   . Heart disease Maternal Grandfather   . Cancer Paternal Grandfather        unknown   Social History   Socioeconomic History  . Marital status: Married    Spouse name: Not on file  . Number of children: Not on file  . Years of education: Not on file  . Highest education level: Not on file  Occupational History  . Not on file  Social Needs  . Financial resource strain: Not hard at all  . Food insecurity:    Worry: Never true    Inability: Never true  . Transportation needs:    Medical: No    Non-medical: No  Tobacco Use  . Smoking status: Former Smoker    Packs/day: 1.00    Years: 20.00    Pack years: 20.00    Types: Cigarettes    Last attempt to quit: 03/09/2008    Years since quitting: 10.3  . Smokeless tobacco: Never Used  Substance and Sexual Activity  . Alcohol use: No  . Drug use: No  . Sexual  activity: Yes  Lifestyle  . Physical activity:    Days per week: Not on file    Minutes per session: Not on file  . Stress: Not on file  Relationships  . Social connections:    Talks on phone: Not on file    Gets together: Not on file    Attends religious service: Not on file    Active member of club or organization: Not on file    Attends meetings of clubs or organizations: Not on file    Relationship status: Not on file  Other Topics Concern  . Not on file  Social History Narrative   Married 25 years.        2 children by 1st husband.       Disabled. Not working.        Outpatient Encounter Medications as of 06/28/2018  Medication Sig  . ACCU-CHEK AVIVA PLUS test strip USE UP TO FOUR TIMES DAILY AS DIRECTED  . ACCU-CHEK SOFTCLIX LANCETS lancets TEST BLOOD SUGAR UP TO FOUR TIMES DAILY AS DIRECTED  . ACETAMINOPHEN PO Take by mouth as needed.    Marland Kitchen albuterol (PROVENTIL HFA;VENTOLIN HFA) 108 (90 Base) MCG/ACT inhaler Inhale 2 puffs into the lungs every 6 (six) hours as needed.  Marland Kitchen albuterol (PROVENTIL) (2.5 MG/3ML) 0.083% nebulizer solution Take 3 mLs (2.5 mg total) by nebulization every 6 (six) hours as needed for wheezing or shortness of breath.  Marland Kitchen aspirin EC 81 MG tablet Take 81 mg by mouth daily.  . B Complex Vitamins (BL VITAMIN B COMPLEX PO) Place 5,000 Film under the tongue daily.  . Blood Glucose Monitoring Suppl (ACCU-CHEK AVIVA PLUS) w/Device KIT USE UP TO FOUR TIMES DAILY AS DIRECTED  . cholecalciferol (VITAMIN D) 400 units TABS tablet Take 1,000 Units by mouth.   . clorazepate (TRANXENE) 7.5 MG tablet Take 7.5 mg by mouth 3 (three) times daily.  . fluticasone (FLONASE) 50 MCG/ACT nasal spray Place 2 sprays into both nostrils daily.  . fluticasone (FLOVENT HFA) 110 MCG/ACT inhaler Inhale 2 puffs into the lungs 2 (two) times daily.  Marland Kitchen loratadine (CLARITIN) 10 MG tablet TAKE ONE (1) TABLET BY MOUTH EVERY DAY  . magnesium hydroxide (MILK OF MAGNESIA) 400 MG/5ML suspension  Take by mouth daily as needed for mild constipation.  . Multiple Vitamin (MULTIVITAMIN) tablet Take 1 tablet by mouth daily.  . naproxen (NAPROSYN) 500 MG  tablet Take 1 tablet (500 mg total) by mouth 2 (two) times daily with a meal.  . Omega-3 Fatty Acids (FISH OIL) 1000 MG CAPS Take 1 capsule by mouth daily.  Marland Kitchen omeprazole (PRILOSEC) 20 MG capsule TAKE 1 CAPSULE DAILY.  Marland Kitchen polyethylene glycol powder (MIRALAX) powder Take 1 Container by mouth daily.  . propranolol (INDERAL) 20 MG tablet Take 1 tablet (20 mg total) by mouth 3 (three) times daily.  . psyllium (METAMUCIL) 58.6 % powder Take 1 packet by mouth daily.  . sertraline (ZOLOFT) 100 MG tablet TAKE 1+1/2 TABLETS BY MOUTH DAILY AS DIRECTED  . Spacer/Aero Chamber Mouthpiece MISC 1 puff by Does not apply route as needed.   No facility-administered encounter medications on file as of 06/28/2018.     Activities of Daily Living In your present state of health, do you have any difficulty performing the following activities: 06/28/2018  Hearing? N  Vision? N  Difficulty concentrating or making decisions? N  Walking or climbing stairs? Y  Comment Asthma. Recent foot surgery.   Dressing or bathing? N  Doing errands, shopping? N  Preparing Food and eating ? N  Using the Toilet? N  In the past six months, have you accidently leaked urine? Y  Comment Managed with daily brief  Do you have problems with loss of bowel control? N  Managing your Medications? N  Managing your Finances? Y  Comment Husband manages the finances.   Housekeeping or managing your Housekeeping? N  Comment She paces herself.   Some recent data might be hidden    Patient Care Team: Burnard Hawthorne, FNP as PCP - General (Family Medicine)    Assessment:   This is a routine wellness examination for [place].  Reports she is still taking Zetia 27m medication at less than a half of a dose, one day a week.  Chronic management follow up with primary care provider tomorrow  06/29/18.   Influenza vaccine not administered due to continued cold symptoms noted as cough and post nasal drip. Deferred to pcp for follow up.   Health Screenings  Mammogram -05/02/18 Colonoscopy -07/01/17 Vit D- 05/02/18 (26.94) Glaucoma -none Hearing- demonstrates normal hearing during conversation Hemoglobin A1C -05/02/18 (5.7) Cholesterol 05/02/18 (233) Dental- every 12 months Vision- every 12 months  Social  Alcohol intake -no Smoking history- former Smokers in home? none Illicit drug use? none Exercise - none due to recent surgery Diet -regular Sexually Active -yes Multiple Partners -no  Safety  Patient feels safe at home.  Patient does have smoke detectors at home  Patient does wear sunscreen or protective clothing when in direct sunlight  Patient does wear seat belt when driving or riding with others.   Activities of Daily Living Patient can do their own household chores. Denies needing assistance with: driving, feeding themselves, getting from bed to chair, getting to the toilet, bathing/showering, dressing, managing money, climbing flight of stairs, or preparing meals.   Depression Screen Currently in counseling with psychiatrist every 4 months. Denies self harm or harm towards others. States she is tearful at times and not resting as well. No change in appetite. Taking medication as directed.   Fall Screen Patient denies being afraid of falling or falling in the last year.   Memory Screen Patient denies problems with memory, misplacing items, and is able to balance checkbook/bank accounts.  Patient is alert, normal appearance, oriented to person/place/and time. Correctly identified the president of the [employer] recall of 2/3 objects, and performing simple calculations.  Patient displays appropriate judgement and can read correct time from watch face.   Immunizations The following Immunizations are up to date:pneumonia and Tdap.  Influenza and shingles  discussed.  Other Providers Patient Care Team: Burnard Hawthorne, FNP as PCP - General (Family Medicine)  Exercise Activities and Dietary recommendations Current Exercise Habits: Home exercise routine, Type of exercise: treadmill  Goals    . Increase physical activity     Walk on the treadmill as tolerated       Fall Risk Fall Risk  06/28/2018 06/25/2017 06/24/2016 04/20/2016  Falls in the past year? 0 No No No   Depression Screen PHQ 2/9 Scores 06/28/2018 05/02/2018 06/25/2017 06/24/2016  PHQ - 2 Score 2 2 0 0  PHQ- 9 Score 5 7 - -     Cognitive Function MMSE - Mini Mental State Exam 06/25/2017  Orientation to time 5  Orientation to Place 5  Registration 3  Attention/ Calculation 5  Recall 3  Language- name 2 objects 2  Language- repeat 1  Language- follow 3 step command 3  Language- read & follow direction 1  Write a sentence 1  Copy design 1  Total score 30     6CIT Screen 06/28/2018 06/24/2016  What Year? 0 points 0 points  What month? 0 points 0 points  What time? 0 points 0 points  Count back from 20 0 points 0 points  Months in reverse 0 points 0 points  Repeat phrase 0 points 0 points  Total Score 0 0    Immunization History  Administered Date(s) Administered  . Influenza Split 04/17/2013  . Influenza Whole 05/18/2008, 05/29/2011  . Influenza-Unspecified 03/28/2015, 02/16/2016, 03/05/2017  . Pneumococcal Polysaccharide-23 05/02/2018  . Td 05/18/1997  . Tdap 07/16/2009, 04/17/2013   Screening Tests Health Maintenance  Topic Date Due  . INFLUENZA VACCINE  12/16/2017  . PAP SMEAR-Modifier  04/20/2020  . MAMMOGRAM  05/02/2020  . COLONOSCOPY  07/01/2022  . TETANUS/TDAP  04/18/2023  . HIV Screening  Completed      Plan:   End of life planning; Advanced aging; Advanced directives discussed.  No HCPOA/Living Will.  Additional information declined at this time.  I have personally reviewed and noted the following in the patient's chart:   . Medical and  social history . Use of alcohol, tobacco or illicit drugs  . Current medications and supplements . Functional ability and status . Nutritional status . Physical activity . Advanced directives . List of other physicians . Hospitalizations, surgeries, and ER visits in previous 12 months . Vitals . Screenings to include cognitive, depression, and falls . Referrals and appointments  In addition, I have reviewed and discussed with patient certain preventive protocols, quality metrics, and best practice recommendations. A written personalized care plan for preventive services as well as general preventive health recommendations were provided to patient.     Varney Biles, LPN  7/67/2094   Reviewed above information.  Has f/u appt scheduled with Mable Paris tomorrow to discuss above concerns and issues.  Agree with assessment and plan.    Dr Nicki Reaper

## 2018-06-29 ENCOUNTER — Ambulatory Visit (INDEPENDENT_AMBULATORY_CARE_PROVIDER_SITE_OTHER): Payer: Medicare HMO | Admitting: Family

## 2018-06-29 ENCOUNTER — Encounter: Payer: Self-pay | Admitting: Family

## 2018-06-29 DIAGNOSIS — J45901 Unspecified asthma with (acute) exacerbation: Secondary | ICD-10-CM | POA: Diagnosis not present

## 2018-06-29 NOTE — Progress Notes (Signed)
Subjective:    Patient ID: Brittney Ruiz, female    DOB: 1966/04/12, 53 y.o.   MRN: [id number]  CC: Brittney Ruiz is a 53 y.o. female who presents today for follow up.   HPI:  Complains of cough,wheezing 4 days, unchanged. . Cough is not 'as bad as it was.' It is not continuous. Cough is sporadic. Not worse with activity.  Using nebulizer 3 x per week, claritin, flonase, flovent.  Flovent helps. Stopped taking mucinex.  Sinus pressure, slight. Post nasal drip. Brief breaks with cough, wheezing. No fever, sore throat, ear pain.   Seen by allergist. Advised symbicort however concern for anxiety so he prescribed flovent.    Had asthma 10-12 years per patient.   ED for chest wall pain 06/13/18, suspected coschronditis. Started on naprosyn with relief. Now taking ibuprofen 481m with relief.   Seen Brittney Faith1/23/20  Following with podiatry for plantar fasciitis, in grown toenail.   Has been treated with doxycycline, Augmentin 1 month ago. CXR 06/13/18 - coarse interstitial markings.  No acute findings.   Pending cardiac CT, echo.  HISTORY:  Past Medical History:  Diagnosis Date  . Abnormal Pap smear of cervix    h/o LEEP  . Allergy   . Angio-edema   . Anxiety   . Asthma   . Cancer ([place]    melanoma 2003  . Constipation   . Depression   . Diarrhea   . Hypertension   . Low back pain   . [place]    right foot s/p lymph node removed right groin  . Obesity (BMI 30-39.9)   . Urticaria   . Vaginal delivery    X 2   Past Surgical History:  Procedure Laterality Date  . BLADDER SUSPENSION  07/2017  . COLONOSCOPY WITH PROPOFOL N/A 07/01/2017   Procedure: COLONOSCOPY WITH PROPOFOL;  Surgeon: TVirgel Manifold MD;  Location: [facility] ENDOSCOPY;  Service: Endoscopy;  Laterality: N/A;  . ESOPHAGOGASTRODUODENOSCOPY (EGD) WITH PROPOFOL N/A 07/01/2017   Procedure: ESOPHAGOGASTRODUODENOSCOPY (EGD) WITH PROPOFOL;  Surgeon: TVirgel Manifold MD;  Location: [facility] ENDOSCOPY;  Service:  Endoscopy;  Laterality: N/A;  . LEEP  1995  . LEEP    . PARTIAL HYSTERECTOMY  08/2017   s/p partial hysterectomy - per patient cervix, uterus, fallopian tubes; ovaries INTACT  . SKIN CANCER EXCISION     melanoma  . TONSILLECTOMY     Family History  Problem Relation Age of Onset  . Hyperlipidemia Mother   . Hypertension Mother   . Diabetes Mother   . Breast cancer Mother 775 . Cancer Father        lung  . Depression Father   . Hyperlipidemia Sister   . Hypertension Sister   . Migraines Sister   . Hypertension Brother   . Hyperlipidemia Brother   . Migraines Brother   . Cancer Maternal Grandmother        brain  . Depression Maternal Grandfather   . Heart disease Maternal Grandfather   . Cancer Paternal Grandfather        unknown    Allergies: Pork allergy; Pork-derived products; Sulfur; Aciphex [rabeprazole sodium]; Antivert [meclizine hcl]; Buspar [buspirone hcl]; Cat hair extract; Chlorpheniramine-pseudoeph; Chlorpheniramine-pseudoeph; Chocolate flavor; Codeine; Crestor [rosuvastatin calcium]; Cymbalta [duloxetine hcl]; Duloxetine; Effexor xr [venlafaxine hydrochloride]; Epinephrine; Erythromycin; Escitalopram oxalate; Lamictal [lamotrigine]; Levofloxacin; Lipitor [atorvastatin]; Meclizine; Mometasone furoate; Mometasone furoate; Monosodium glutamate; Neurontin [gabapentin]; Other; Prednisone; Shellfish allergy; Sudafed [pseudoephedrine hcl]; Sulfa antibiotics; Sulfasalazine; Valium; Wellbutrin [bupropion hcl]; Xanax  xr [alprazolam]; Alfalfa; Brompheniramine-phenylephrine; Buspirone; and Tape Current Outpatient Medications on File Prior to Visit  Medication Sig Dispense Refill  . ACCU-CHEK AVIVA PLUS test strip USE UP TO FOUR TIMES DAILY AS DIRECTED 100 each 11  . ACCU-CHEK SOFTCLIX LANCETS lancets TEST BLOOD SUGAR UP TO FOUR TIMES DAILY AS DIRECTED 100 each 6  . ACETAMINOPHEN PO Take by mouth as needed.      Marland Kitchen albuterol (PROVENTIL HFA;VENTOLIN HFA) 108 (90 Base) MCG/ACT inhaler  Inhale 2 puffs into the lungs every 6 (six) hours as needed. 1 Inhaler 1  . albuterol (PROVENTIL) (2.5 MG/3ML) 0.083% nebulizer solution Take 3 mLs (2.5 mg total) by nebulization every 6 (six) hours as needed for wheezing or shortness of breath. 30 mL 3  . aspirin EC 81 MG tablet Take 81 mg by mouth daily.    . B Complex Vitamins (BL VITAMIN B COMPLEX PO) Place 5,000 Film under the tongue daily.    . Blood Glucose Monitoring Suppl (ACCU-CHEK AVIVA PLUS) w/Device KIT USE UP TO FOUR TIMES DAILY AS DIRECTED 1 kit 0  . cholecalciferol (VITAMIN D) 400 units TABS tablet Take 1,000 Units by mouth.     . clorazepate (TRANXENE) 7.5 MG tablet Take 7.5 mg by mouth 3 (three) times daily.    . fluticasone (FLONASE) 50 MCG/ACT nasal spray Place 2 sprays into both nostrils daily. 1 g 5  . fluticasone (FLOVENT HFA) 110 MCG/ACT inhaler Inhale 2 puffs into the lungs 2 (two) times daily. 3 Inhaler 0  . loratadine (CLARITIN) 10 MG tablet TAKE ONE (1) TABLET BY MOUTH EVERY DAY 90 tablet 3  . magnesium hydroxide (MILK OF MAGNESIA) 400 MG/5ML suspension Take by mouth daily as needed for mild constipation.    . Multiple Vitamin (MULTIVITAMIN) tablet Take 1 tablet by mouth daily.    . naproxen (NAPROSYN) 500 MG tablet Take 1 tablet (500 mg total) by mouth 2 (two) times daily with a meal. 20 tablet 2  . Omega-3 Fatty Acids (FISH OIL) 1000 MG CAPS Take 1 capsule by mouth daily.    Marland Kitchen omeprazole (PRILOSEC) 20 MG capsule TAKE 1 CAPSULE DAILY. 90 capsule 0  . polyethylene glycol powder (MIRALAX) powder Take 1 Container by mouth daily.    . propranolol (INDERAL) 20 MG tablet Take 1 tablet (20 mg total) by mouth 3 (three) times daily. 90 tablet 3  . psyllium (METAMUCIL) 58.6 % powder Take 1 packet by mouth daily.    . sertraline (ZOLOFT) 100 MG tablet TAKE 1+1/2 TABLETS BY MOUTH DAILY AS DIRECTED 45 tablet 3  . Spacer/Aero Chamber Mouthpiece MISC 1 puff by Does not apply route as needed. 1 each 1   No current  facility-administered medications on file prior to visit.     Social History   Tobacco Use  . Smoking status: Former Smoker    Packs/day: 1.00    Years: 20.00    Pack years: 20.00    Types: Cigarettes    Last attempt to quit: 03/09/2008    Years since quitting: 10.3  . Smokeless tobacco: Never Used  Substance Use Topics  . Alcohol use: No  . Drug use: No    Review of Systems  Constitutional: Negative for chills and fever.  HENT: Positive for congestion.   Respiratory: Positive for cough and wheezing. Negative for shortness of breath.   Cardiovascular: Negative for chest pain and palpitations.  Gastrointestinal: Negative for nausea and vomiting.      Objective:    BP 116/67 (BP  Location: Left Arm, Patient Position: Sitting, Cuff Size: Large)   Pulse 79   Temp 98 F (36.7 C)   Wt 226 lb 9.6 oz (102.8 kg)   LMP  (LMP Unknown) Comment: urine Preg negative   SpO2 95%   BMI 42.12 kg/m  BP Readings from Last 3 Encounters:  06/29/18 116/67  06/28/18 122/78  06/21/18 135/86   Wt Readings from Last 3 Encounters:  06/29/18 226 lb 9.6 oz (102.8 kg)  06/28/18 225 lb 1.9 oz (102.1 kg)  06/13/18 227 lb (103 kg)    Physical Exam Vitals signs reviewed.  Constitutional:      Appearance: She is well-developed.  HENT:     Head: Normocephalic and atraumatic.     Right Ear: Hearing, tympanic membrane, ear canal and external ear normal. No decreased hearing noted. No drainage, swelling or tenderness. No middle ear effusion. No foreign body. Tympanic membrane is not erythematous or bulging.     Left Ear: Hearing, tympanic membrane, ear canal and external ear normal. No decreased hearing noted. No drainage, swelling or tenderness.  No middle ear effusion. No foreign body. Tympanic membrane is not erythematous or bulging.     Nose: Nose normal. No rhinorrhea.     Right Sinus: No maxillary sinus tenderness or frontal sinus tenderness.     Left Sinus: No maxillary sinus tenderness or  frontal sinus tenderness.     Mouth/Throat:     Pharynx: Uvula midline. No oropharyngeal exudate or posterior oropharyngeal erythema.     Tonsils: No tonsillar abscesses.  Eyes:     Conjunctiva/sclera: Conjunctivae normal.  Cardiovascular:     Rate and Rhythm: Regular rhythm.     Pulses: Normal pulses.     Heart sounds: Normal heart sounds.  Pulmonary:     Effort: Pulmonary effort is normal.     Breath sounds: Examination of the left-lower field reveals wheezing. Wheezing present. No rhonchi or rales.     Comments: Few expiratory wheezes Lymphadenopathy:     Head:     Right side of head: No submental, submandibular, tonsillar, preauricular, posterior auricular or occipital adenopathy.     Left side of head: No submental, submandibular, tonsillar, preauricular, posterior auricular or occipital adenopathy.     Cervical: No cervical adenopathy.  Skin:    General: Skin is warm and dry.  Neurological:     Mental Status: She is alert.  Psychiatric:        Speech: Speech normal.        Behavior: Behavior normal.        Thought Content: Thought content normal.        Assessment & Plan:   Problem List Items Addressed This Visit      Respiratory   Exacerbation of asthma    No acute respiratory distress.  SaO2 95%.  Symptoms not well controlled, advised her to take her Flovent as prescribed twice daily.  After this simple measure, if she does not find that her cough, congestion improves, we will consult pulmonology.           I am having Brittney Ruiz. Brittney Ruiz maintain her [employer], ACETAMINOPHEN PO, clorazepate, multivitamin, sertraline, loratadine, propranolol, omeprazole, albuterol, cholecalciferol, psyllium, B Complex Vitamins (BL VITAMIN B COMPLEX PO), aspirin EC, Fish Oil, polyethylene glycol powder, fluticasone, albuterol, ACCU-CHEK SOFTCLIX LANCETS, ACCU-CHEK AVIVA PLUS, ACCU-CHEK AVIVA PLUS, magnesium hydroxide, fluticasone, and naproxen.   No orders of the  defined types were placed in this encounter.   Return precautions given.  Risks, benefits, and alternatives of the medications and treatment plan prescribed today were discussed, and patient expressed understanding.   Education regarding symptom management and diagnosis given to patient on AVS.  Continue to follow with Burnard Hawthorne, FNP for routine health maintenance.   Aggie Hacker and I agreed with plan.   Mable Paris, FNP

## 2018-06-29 NOTE — Patient Instructions (Addendum)
Start mucinex.   Plenty of water  Please start flovent two puffs twice per day and see if cough improves.   Follow up one month

## 2018-07-01 ENCOUNTER — Other Ambulatory Visit
Admission: RE | Admit: 2018-07-01 | Discharge: 2018-07-01 | Disposition: A | Discharge status: Home / Self Care | Payer: Medicare HMO | Source: Ambulatory Visit | Attending: Physician Assistant | Admitting: Physician Assistant

## 2018-07-01 DIAGNOSIS — I340 Nonrheumatic mitral (valve) insufficiency: Secondary | ICD-10-CM

## 2018-07-01 LAB — BASIC METABOLIC PANEL
Anion gap: 8 (ref 5–15)
BUN: 22 mg/dL — ABNORMAL HIGH (ref 6–20)
CHLORIDE: 101 mmol/L (ref 98–111)
CO2: 28 mmol/L (ref 22–32)
Calcium: 9.7 mg/dL (ref 8.9–10.3)
Creatinine, Ser: 0.65 mg/dL (ref 0.44–1.00)
GFR calc Af Amer: >60 mL/min (ref >60)
GFR calc non Af Amer: >60 mL/min (ref >60)
Glucose, Bld: 116 mg/dL — ABNORMAL HIGH (ref 70–99)
POTASSIUM: 4.4 mmol/L (ref 3.5–5.1)
Sodium: 137 mmol/L (ref 135–145)

## 2018-07-01 NOTE — Assessment & Plan Note (Addendum)
No acute respiratory distress.  SaO2 95%.  Symptoms not well controlled, advised her to take her Flovent as prescribed twice daily.  After this simple measure, if she does not find that her cough, congestion improves, we will consult pulmonology.

## 2018-07-04 ENCOUNTER — Ambulatory Visit: Payer: Medicare HMO | Admitting: Family

## 2018-07-05 ENCOUNTER — Ambulatory Visit (INDEPENDENT_AMBULATORY_CARE_PROVIDER_SITE_OTHER): Payer: Medicare HMO

## 2018-07-05 DIAGNOSIS — I340 Nonrheumatic mitral (valve) insufficiency: Secondary | ICD-10-CM

## 2018-07-06 NOTE — Progress Notes (Signed)
Just a FYI. Patient stated that she was better & not wheezing. She is using both nebulizer & flovent. She has CT set up for next Wed. & is awaiting ECHO results. She stating that her nose is still congested, but is only blowing clear.

## 2018-07-12 ENCOUNTER — Telehealth (HOSPITAL_COMMUNITY): Payer: Self-pay | Admitting: Emergency Medicine

## 2018-07-12 NOTE — Telephone Encounter (Signed)
Left message on voicemail with name and callback number Evann Erazo RN Navigator Cardiac Imaging [facility] [phone] Office [phone] Cell  

## 2018-07-12 NOTE — Telephone Encounter (Signed)
Reaching out to patient to offer assistance regarding upcoming cardiac imaging study; pt verbalizes understanding of appt date/time, parking situation and where to check in, pre-test NPO status and medications ordered, and verified current allergies; name and call back number provided for further questions should they arise Marchia Bond RN Navigator Cardiac Imaging [place] [facility] [phone] office [phone] cell  Pt returned phone call, verbalized understanding

## 2018-07-13 ENCOUNTER — Ambulatory Visit (HOSPITAL_COMMUNITY)
Admission: RE | Admit: 2018-07-13 | Discharge: 2018-07-13 | Disposition: A | Discharge status: Home / Self Care | Payer: Medicare HMO | Source: Ambulatory Visit | Attending: Physician Assistant | Admitting: Physician Assistant

## 2018-07-13 DIAGNOSIS — I340 Nonrheumatic mitral (valve) insufficiency: Secondary | ICD-10-CM | POA: Diagnosis not present

## 2018-07-13 DIAGNOSIS — Z006 Encounter for examination for normal comparison and control in clinical research program: Secondary | ICD-10-CM

## 2018-07-13 MED ORDER — IOPAMIDOL (ISOVUE-370) INJECTION 76%
80.0000 mL | Freq: Once | INTRAVENOUS | Status: AC | PRN
  Administered 2018-07-13: 80 mL via INTRAVENOUS

## 2018-07-13 MED ORDER — NITROGLYCERIN 0.4 MG SL SUBL
0.8000 mg | SUBLINGUAL_TABLET | Freq: Once | SUBLINGUAL | Status: AC
  Administered 2018-07-13: 0.8 mg via SUBLINGUAL
  Filled 2018-07-13: qty 25

## 2018-07-13 MED ORDER — NITROGLYCERIN 0.4 MG SL SUBL
SUBLINGUAL_TABLET | SUBLINGUAL | Status: AC
  Filled 2018-07-13: qty 2

## 2018-07-13 NOTE — Research (Signed)
Aggie Hacker met inclusion and exclusion criteria.  The informed consent form, study requirements and expectations were reviewed with the subject and questions and concerns were addressed prior to the signing of the consent form.  The subject verbalized understanding of the trial requirements.  The subject agreed to participate in the CADFEM trial and signed the informed consent.  The informed consent was obtained prior to performance of any protocol-specific procedures for the subject.  A copy of the signed informed consent was given to the subject and a copy was placed in the subject's medical record.   Dionne Bucy. [employer]

## 2018-07-18 ENCOUNTER — Telehealth: Payer: Self-pay | Admitting: *Deleted

## 2018-07-18 NOTE — Telephone Encounter (Signed)
Pt states she received a bill for $45.00 and was told to talk to the nurse because it was a follow up appt and she got a boot that was not helping her foot and now she has plantar fasciitis in both feet and needed to know if she would be charged for the follow up appt on Thursday.

## 2018-07-19 ENCOUNTER — Encounter: Payer: Self-pay | Admitting: Podiatry

## 2018-07-19 ENCOUNTER — Other Ambulatory Visit: Payer: Self-pay | Admitting: Family

## 2018-07-19 ENCOUNTER — Ambulatory Visit (INDEPENDENT_AMBULATORY_CARE_PROVIDER_SITE_OTHER): Payer: Medicare HMO | Admitting: Podiatry

## 2018-07-19 DIAGNOSIS — L600 Ingrowing nail: Secondary | ICD-10-CM | POA: Diagnosis not present

## 2018-07-19 DIAGNOSIS — M722 Plantar fascial fibromatosis: Secondary | ICD-10-CM | POA: Diagnosis not present

## 2018-07-21 ENCOUNTER — Other Ambulatory Visit: Payer: Self-pay | Admitting: Family

## 2018-07-21 NOTE — Progress Notes (Signed)
   Subjective: 53 year old female presenting today for follow up evaluation of plantar fasciitis of the left foot. She states the injection she received helped alleviate the pain for a couple of days. She reports associated numbness and tingling in the heel. She has been taking Ibuprofen with some relief of the symptoms. Walking increases the pain. Patient is here for further evaluation and treatment.   Past Medical History:  Diagnosis Date  . Abnormal Pap smear of cervix    h/o LEEP  . Allergy   . Angio-edema   . Anxiety   . Asthma   . Cancer ([place])    melanoma 2003  . Constipation   . Depression   . Diarrhea   . Hypertension   . Low back pain   . Melanoma ([place])    right foot s/p lymph node removed right groin  . Obesity (BMI 30-39.9)   . Urticaria   . Vaginal delivery    X 2     Objective: Physical Exam General: The patient is alert and oriented x3 in no acute distress.  Dermatology: Skin is warm, dry and supple bilateral lower extremities. Negative for open lesions or macerations bilateral.   Vascular: Dorsalis Pedis and Posterior Tibial pulses palpable bilateral.  Capillary fill time is immediate to all digits.  Neurological: Epicritic and protective threshold intact bilateral.   Musculoskeletal: Tenderness to palpation to the plantar aspect of the left heel along the plantar fascia. All other joints range of motion within normal limits bilateral. Strength 5/5 in all groups bilateral.   Assessment: 1. Plantar fasciitis left foot  Plan of Care:  1. Patient evaluated.  2. Continue taking Motrin 400 mg TID since it is alleviating the symptoms.  3. Continue using plantar fascial brace.  4. Recommended OTC insoles from [employer].  5. Continue daily stretching exercises.  6. Offered physical therapy. Patient will consider.  7. Return to clinic as needed. Patient may need EPF surgery in the future.    Edrick Kins, DPM [facility]  Dr. Edrick Kins, DPM    [address], [place] [address]                Office [phone]  Fax [phone]

## 2018-07-22 ENCOUNTER — Telehealth: Payer: Self-pay | Admitting: Family

## 2018-07-22 DIAGNOSIS — J45901 Unspecified asthma with (acute) exacerbation: Secondary | ICD-10-CM

## 2018-07-22 MED ORDER — AMOXICILLIN 500 MG PO CAPS: 500.0000 mg | ORAL_CAPSULE | Freq: Two times a day (BID) | ORAL | 0 refills | Status: DC

## 2018-07-22 NOTE — Telephone Encounter (Signed)
Call pt  Can she take azithromycin? I see erythromycin on her allergy list?  If she can take zpak. Please send in. It is pended   I do not want to use augmentin, doxycycline since recently used this year  Also, she needs to see a pulmonologist; I have made a referral

## 2018-07-22 NOTE — Telephone Encounter (Signed)
I called 7 informed patient. She also has 1:15 appointment Monday.

## 2018-07-22 NOTE — Telephone Encounter (Signed)
Copied from [place] [id number]. Topic: Quick Communication - See Telephone Encounter >> Jul 22, 2018 10:36 AM Burchel, Abbi R wrote: CRM for notification. See Telephone encounter for: 07/22/18.  Pt states she is still experiencing Upper Respiratory sx.  Pt would like a call back to discuss next steps/ abx  Pt [phone]

## 2018-07-22 NOTE — Telephone Encounter (Signed)
Call pt  I sent in amoxicillin however PCNs are going to have less coverage than zpak.

## 2018-07-22 NOTE — Telephone Encounter (Signed)
Called and spoke to pt.  Pt stated that she is still having the same symptoms that she has been having for the past 2 1/2 months.  Pt stated that she is having hoarseness, coughing, a little wheezing, sinus pressure, nasal drip and a sore throat.  Pt denies having any shortness of breath and no chest pain.  Pt says that she is coughing up yellow mucus and cannot taste nor smell anything.  Pt says that she does not have a fever but it's hard to monitor since she is taking ibuprofen daily since she had foot surgery 3 weeks ago.  Pt stated that foot surgeon told her to take 800 mg of ibuprofen every 6 hours until the swelling in her foot goes down.  Pt says that she has only been taking  400 mg (2 tabs of 200 mg) twice a day. Pt. Says that she is taking Flonase and Claritin everyday.  Pt. Says she is also using inhaler and and nebulizer w/ breathing treatments as needed.  Pt says that she last took a breathing treatment last night.  Pt wanted to move her f/u appt up with [name], NP and also wanted to know if something could be called in to pharmacy.  Scheduled appt w/ Mable Paris, NP for Monday (3/9) @ 1:15 pm.  Advised pt to go to UC or ER if symptoms worsen.  Will forward note to PCP.

## 2018-07-25 ENCOUNTER — Ambulatory Visit (INDEPENDENT_AMBULATORY_CARE_PROVIDER_SITE_OTHER): Payer: Medicare HMO | Admitting: Family

## 2018-07-25 ENCOUNTER — Encounter: Payer: Self-pay | Admitting: Family

## 2018-07-25 DIAGNOSIS — J45901 Unspecified asthma with (acute) exacerbation: Secondary | ICD-10-CM | POA: Diagnosis not present

## 2018-07-25 NOTE — Progress Notes (Signed)
Subjective:    Patient ID: Brittney Ruiz, female    DOB: Oct 13, 1965, 53 y.o.   MRN: [id number]  CC: Brittney Ruiz is a 53 y.o. female who presents today for an acute visit.    HPI: Cough has resolved, on amoxicillin.   Congestion is now clear.   Post nasal drip, occasional wheezing 'which is my baseline'. No sob, cp, fever, bodyaches.   Endorses hoarseness. Nebulizer today which improves wheezing   On claritin and flonase. Using flovent BID.   Has seen ENT, for allergies. At that time, trialed start symbicort , singulair however  made her more anxious.   This is time of year when has seasonal allergies.     HISTORY:  Past Medical History:  Diagnosis Date  . Abnormal Pap smear of cervix    h/o LEEP  . Allergy   . Angio-edema   . Anxiety   . Asthma   . Cancer ([place])    melanoma 2003  . Constipation   . Depression   . Diarrhea   . Hypertension   . Low back pain   . Melanoma ([place])    right foot s/p lymph node removed right groin  . Obesity (BMI 30-39.9)   . Urticaria   . Vaginal delivery    X 2   Past Surgical History:  Procedure Laterality Date  . BLADDER SUSPENSION  07/2017  . COLONOSCOPY WITH PROPOFOL N/A 07/01/2017   Procedure: COLONOSCOPY WITH PROPOFOL;  Surgeon: Virgel Manifold, MD;  Location: [facility] ENDOSCOPY;  Service: Endoscopy;  Laterality: N/A;  . ESOPHAGOGASTRODUODENOSCOPY (EGD) WITH PROPOFOL N/A 07/01/2017   Procedure: ESOPHAGOGASTRODUODENOSCOPY (EGD) WITH PROPOFOL;  Surgeon: Virgel Manifold, MD;  Location: [facility] ENDOSCOPY;  Service: Endoscopy;  Laterality: N/A;  . LEEP  1995  . LEEP    . PARTIAL HYSTERECTOMY  08/2017   s/p partial hysterectomy - per patient cervix, uterus, fallopian tubes; ovaries INTACT  . SKIN CANCER EXCISION     melanoma  . TONSILLECTOMY     Family History  Problem Relation Age of Onset  . Hyperlipidemia Mother   . Hypertension Mother   . Diabetes Mother   . Breast cancer Mother 30  . Cancer Father        lung  .  Depression Father   . Hyperlipidemia Sister   . Hypertension Sister   . Migraines Sister   . Hypertension Brother   . Hyperlipidemia Brother   . Migraines Brother   . Cancer Maternal Grandmother        brain  . Depression Maternal Grandfather   . Heart disease Maternal Grandfather   . Cancer Paternal Grandfather        unknown    Allergies: Pork allergy; Pork-derived products; Sulfur; Aciphex [rabeprazole sodium]; Antivert [meclizine hcl]; Buspar [buspirone hcl]; Cat hair extract; Chlorpheniramine-pseudoeph; Chlorpheniramine-pseudoeph; Chocolate flavor; Codeine; Crestor [rosuvastatin calcium]; Cymbalta [duloxetine hcl]; Duloxetine; Effexor xr [venlafaxine hydrochloride]; Epinephrine; Erythromycin; Escitalopram oxalate; Lamictal [lamotrigine]; Levofloxacin; Lipitor [atorvastatin]; Meclizine; Mometasone furoate; Mometasone furoate; Monosodium glutamate; Neurontin [gabapentin]; Other; Prednisone; Shellfish allergy; Sudafed [pseudoephedrine hcl]; Sulfa antibiotics; Sulfasalazine; Valium; Wellbutrin [bupropion hcl]; Xanax xr [alprazolam]; Alfalfa; Brompheniramine-phenylephrine; Buspirone; and Tape Current Outpatient Medications on File Prior to Visit  Medication Sig Dispense Refill  . ACCU-CHEK AVIVA PLUS test strip USE UP TO FOUR TIMES DAILY AS DIRECTED 100 each 11  . ACCU-CHEK SOFTCLIX LANCETS lancets TEST BLOOD SUGAR UP TO FOUR TIMES DAILY AS DIRECTED 400 each 0  . ACETAMINOPHEN PO Take by mouth as needed.      Marland Kitchen  albuterol (PROVENTIL HFA;VENTOLIN HFA) 108 (90 Base) MCG/ACT inhaler Inhale 2 puffs into the lungs every 6 (six) hours as needed. 1 Inhaler 1  . albuterol (PROVENTIL) (2.5 MG/3ML) 0.083% nebulizer solution Take 3 mLs (2.5 mg total) by nebulization every 6 (six) hours as needed for wheezing or shortness of breath. 30 mL 3  . amoxicillin (AMOXIL) 500 MG capsule Take 1 capsule (500 mg total) by mouth 2 (two) times daily. 14 capsule 0  . aspirin EC 81 MG tablet Take 81 mg by mouth daily.     . B Complex Vitamins (BL VITAMIN B COMPLEX PO) Place 5,000 Film under the tongue daily.    . Blood Glucose Monitoring Suppl (ACCU-CHEK AVIVA PLUS) w/Device KIT USE UP TO FOUR TIMES DAILY AS DIRECTED 1 kit 0  . cholecalciferol (VITAMIN D) 400 units TABS tablet Take 1,000 Units by mouth.     . clorazepate (TRANXENE) 7.5 MG tablet Take 7.5 mg by mouth 3 (three) times daily.    . fluticasone (FLONASE) 50 MCG/ACT nasal spray Place 2 sprays into both nostrils daily. 1 g 5  . fluticasone (FLOVENT HFA) 110 MCG/ACT inhaler Inhale 2 puffs into the lungs 2 (two) times daily. 3 Inhaler 0  . loratadine (CLARITIN) 10 MG tablet TAKE ONE (1) TABLET BY MOUTH EVERY DAY 90 tablet 3  . magnesium hydroxide (MILK OF MAGNESIA) 400 MG/5ML suspension Take by mouth daily as needed for mild constipation.    . Multiple Vitamin (MULTIVITAMIN) tablet Take 1 tablet by mouth daily.    . naproxen (NAPROSYN) 500 MG tablet Take 1 tablet (500 mg total) by mouth 2 (two) times daily with a meal. 20 tablet 2  . Omega-3 Fatty Acids (FISH OIL) 1000 MG CAPS Take 1 capsule by mouth daily.    Marland Kitchen omeprazole (PRILOSEC) 20 MG capsule TAKE 1 CAPSULE DAILY. 90 capsule 0  . polyethylene glycol powder (MIRALAX) powder Take 1 Container by mouth daily.    . propranolol (INDERAL) 20 MG tablet Take 1 tablet (20 mg total) by mouth 3 (three) times daily. 90 tablet 3  . psyllium (METAMUCIL) 58.6 % powder Take 1 packet by mouth daily.    . sertraline (ZOLOFT) 100 MG tablet TAKE 1+1/2 TABLETS BY MOUTH DAILY AS DIRECTED 45 tablet 3  . Spacer/Aero Chamber Mouthpiece MISC 1 puff by Does not apply route as needed. 1 each 1   No current facility-administered medications on file prior to visit.     Social History   Tobacco Use  . Smoking status: Former Smoker    Packs/day: 1.00    Years: 20.00    Pack years: 20.00    Types: Cigarettes    Last attempt to quit: 03/09/2008    Years since quitting: 10.3  . Smokeless tobacco: Never Used  Substance Use  Topics  . Alcohol use: No  . Drug use: No    Review of Systems  Constitutional: Negative for chills and fever.  HENT: Positive for postnasal drip. Negative for congestion.   Respiratory: Positive for wheezing. Negative for cough and shortness of breath.   Cardiovascular: Negative for chest pain and palpitations.  Gastrointestinal: Negative for nausea and vomiting.      Objective:    BP 122/72 (BP Location: Left Arm, Patient Position: Sitting, Cuff Size: Large)   Pulse 71   Temp (!) 97.5 F (36.4 C)   Wt 227 lb (103 kg)   LMP  (LMP Unknown) Comment: urine Preg negative   SpO2 95%   BMI 42.20  kg/m    Physical Exam Vitals signs reviewed.  Constitutional:      Appearance: She is well-developed.  HENT:     Head: Normocephalic and atraumatic.     Right Ear: Hearing, tympanic membrane, ear canal and external ear normal. No decreased hearing noted. No drainage, swelling or tenderness. No middle ear effusion. No foreign body. Tympanic membrane is not erythematous or bulging.     Left Ear: Hearing, tympanic membrane, ear canal and external ear normal. No decreased hearing noted. No drainage, swelling or tenderness.  No middle ear effusion. No foreign body. Tympanic membrane is not erythematous or bulging.     Nose: Nose normal. No rhinorrhea.     Right Sinus: No maxillary sinus tenderness or frontal sinus tenderness.     Left Sinus: No maxillary sinus tenderness or frontal sinus tenderness.     Mouth/Throat:     Pharynx: Uvula midline. No oropharyngeal exudate or posterior oropharyngeal erythema.     Tonsils: No tonsillar abscesses.  Eyes:     Conjunctiva/sclera: Conjunctivae normal.  Cardiovascular:     Rate and Rhythm: Regular rhythm.     Pulses: Normal pulses.     Heart sounds: Normal heart sounds.  Pulmonary:     Effort: Pulmonary effort is normal.     Breath sounds: Examination of the right-lower field reveals wheezing. Examination of the left-lower field reveals wheezing.  Wheezing present. No rhonchi or rales.     Comments: Few expiratory wheezes heard bilaterally. Lymphadenopathy:     Head:     Right side of head: No submental, submandibular, tonsillar, preauricular, posterior auricular or occipital adenopathy.     Left side of head: No submental, submandibular, tonsillar, preauricular, posterior auricular or occipital adenopathy.     Cervical: No cervical adenopathy.  Skin:    General: Skin is warm and dry.  Neurological:     Mental Status: She is alert.  Psychiatric:        Speech: Speech normal.        Behavior: Behavior normal.        Thought Content: Thought content normal.        Assessment & Plan:   Problem List Items Addressed This Visit      Respiratory   Exacerbation of asthma    Patient is well-appearing, no acute respiratory stress.  She is non-labored in her speech.  SaO2 is 95%.  On Flovent, Flonase.  Appears to be improved on amoxicillin, will continue.  Referral to pulmonology to optimize asthma therapy.            I am having Charlissa Petros. Lebeau maintain her [employer], ACETAMINOPHEN PO, clorazepate, multivitamin, sertraline, loratadine, propranolol, omeprazole, albuterol, cholecalciferol, psyllium, B Complex Vitamins (BL VITAMIN B COMPLEX PO), aspirin EC, Fish Oil, polyethylene glycol powder, fluticasone, albuterol, Accu-Chek Aviva Plus, magnesium hydroxide, fluticasone, naproxen, Accu-Chek Softclix Lancets, Accu-Chek Aviva Plus, and amoxicillin.   No orders of the defined types were placed in this encounter.   Return precautions given.   Risks, benefits, and alternatives of the medications and treatment plan prescribed today were discussed, and patient expressed understanding.   Education regarding symptom management and diagnosis given to patient on AVS.  Continue to follow with Burnard Hawthorne, FNP for routine health maintenance.   Aggie Hacker and I agreed with plan.   Mable Paris, FNP

## 2018-07-25 NOTE — Patient Instructions (Signed)
Always a pleasure to see you.  Please make sure you are on probiotics.  Referral placed to pulmonology, please call us if you do not hear from [name] about this.   Please let me know if you do not continue to feel better.   Asthma Attack Prevention, Adult Although you may not be able to control the fact that you have asthma, you can take actions to prevent episodes of asthma (asthma attacks). These actions include:  Creating a written plan for managing and treating your asthma attacks (asthma action plan).  Monitoring your asthma.  Avoiding things that can irritate your airways or make your asthma symptoms worse (asthma triggers).  Taking your medicines as directed.  Acting quickly if you have signs or symptoms of an asthma attack. What are some ways to prevent an asthma attack? Create a plan Work with your health care provider to create an asthma action plan. This plan should include:  A list of your asthma triggers and how to avoid them.  A list of symptoms that you experience during an asthma attack.  Information about when to take medicine and how much medicine to take.  Information to help you understand your peak flow measurements.  Contact information for your health care providers.  Daily actions that you can take to control asthma. Monitor your asthma To monitor your asthma:  Use your peak flow meter every morning and every evening for 2-3 weeks. Record the results in a journal. A drop in your peak flow numbers on one or more days may mean that you are starting to have an asthma attack, even if you are not having symptoms.  When you have asthma symptoms, write them down in a journal.  Avoid asthma triggers Work with your health care provider to find out what your asthma triggers are. This can be done by:  Being tested for allergies.  Keeping a journal that notes when asthma attacks occur and what may have contributed to them.  Asking your health care provider  whether other medical conditions make your asthma worse. Common asthma triggers include:  Dust.  Smoke. This includes campfire smoke and secondhand smoke from tobacco products.  Pet dander.  Trees, grasses or pollens.  Very cold, dry, or humid air.  Mold.  Foods that contain high amounts of sulfites.  Strong smells.  Engine exhaust and air pollution.  Aerosol sprays and fumes from household cleaners.  Household pests and their droppings, including dust mites and cockroaches.  Certain medicines, including NSAIDs. Once you have determined your asthma triggers, take steps to avoid them. Depending on your triggers, you may be able to reduce the chance of an asthma attack by:  Keeping your home clean. Have someone dust and vacuum your home for you 1 or 2 times a week. If possible, have them use a high-efficiency particulate arrestance (HEPA) vacuum.  Washing your sheets weekly in hot water.  Using allergy-proof mattress covers and casings on your bed.  Keeping pets out of your home.  Taking care of mold and water problems in your home.  Avoiding areas where people smoke.  Avoiding using strong perfumes or odor sprays.  Avoid spending a lot of time outdoors when pollen counts are high and on very windy days.  Talking with your health care provider before stopping or starting any new medicines. Medicines Take over-the-counter and prescription medicines only as told by your health care provider. Many asthma attacks can be prevented by carefully following your medicine schedule. Taking  your medicines correctly is especially important when you cannot avoid certain asthma triggers. Even if you are doing well, do not stop taking your medicine and do not take less medicine. Act quickly If an asthma attack happens, acting quickly can decrease how severe it is and how long it lasts. Take these actions:  Pay attention to your symptoms. If you are coughing, wheezing, or having  difficulty breathing, do not wait to see if your symptoms go away on their own. Follow your asthma action plan.  If you have followed your asthma action plan and your symptoms are not improving, call your health care provider or seek immediate medical care at the nearest hospital. It is important to write down how often you need to use your fast-acting rescue inhaler. You can track how often you use an inhaler in your journal. If you are using your rescue inhaler more often, it may mean that your asthma is not under control. Adjusting your asthma treatment plan may help you to prevent future asthma attacks and help you to gain better control of your condition. How can I prevent an asthma attack when I exercise? Exercise is a common asthma trigger. To prevent asthma attacks during exercise:  Follow advice from your health care provider about whether you should use your fast-acting inhaler before exercising. Many people with asthma experience exercise-induced bronchoconstriction (EIB). This condition often worsens during vigorous exercise in cold, humid, or dry environments. Usually, people with EIB can stay very active by using a fast-acting inhaler before exercising.  Avoid exercising outdoors in very cold or humid weather.  Avoid exercising outdoors when pollen counts are high.  Warm up and cool down when exercising.  Stop exercising right away if asthma symptoms start. Consider taking part in exercises that are less likely to cause asthma symptoms such as:  Indoor swimming.  Biking.  Walking.  Hiking.  Playing football. This information is not intended to replace advice given to you by your health care provider. Make sure you discuss any questions you have with your health care provider. Document Released: 04/22/2009 Document Revised: 01/03/2016 Document Reviewed: 10/19/2015 Elsevier Interactive Patient Education  2019 [employer].

## 2018-07-25 NOTE — Assessment & Plan Note (Signed)
Patient is well-appearing, no acute respiratory stress.  She is non-labored in her speech.  SaO2 is 95%.  On Flovent, Flonase.  Appears to be improved on amoxicillin, will continue.  Referral to pulmonology to optimize asthma therapy.

## 2018-07-29 ENCOUNTER — Ambulatory Visit: Payer: Medicare HMO | Admitting: Family

## 2018-07-29 NOTE — Progress Notes (Signed)
[facility] Consultation      Assessment and Plan:  Severe persistent asthma with acute bronchitis on chronic bronchitis. Severe anxiety, with multiple medication allergies complicating treatment - Patient notes that she becomes severely anxious with systemic steroids.  We discussed changing her medications from Flovent to Advair 250.  Explained that she is already on inhaled steroid, therefore the steroid component should not be an issue for her.  She did express concern about the medication exacerbating her anxiety.  Explained that given that she is already on a nebulizer and rescue inhaler multiple times per day, I am hopeful that this will not flareup her anxiety. - Patient has a history of severe asthma with 2 weeks hospitalization of the past, and is at high risk for exacerbations, further flareup of disease. -We will need to obtain results of most recent allergy testing.  Meds ordered this encounter  Medications  . Fluticasone-Salmeterol (ADVAIR DISKUS) 250-50 MCG/DOSE AEPB    Sig: Inhale 1 puff into the lungs 2 (two) times daily.    Dispense:  1 each    Refill:  5   Return in about 1 month (around 09/01/2018).   Date: 08/01/2018  MRN# [id number] CHARLITA BRIAN 1965-09-09    CLINTON WAHLBERG is a 53 y.o. old female seen in consultation for chief complaint of:    Chief Complaint  Patient presents with  . pulmonary consult    dx with asthma 11 years ago. pt reports of prod cough with pale yellow mucus, wheezing, sob with exertion. has had four rounds of abx with relief in sx, however they are still present.     HPI:   She has had a lot of congestion for about 3-4 months. She was seen by her PCP around that time with wheezing, was sent for a CXR and told that she had pneumonia. She received 4 courses of abx, started on flovent inhaler. She has a history of severe asthma attack requiring 2 weeks in the hospital and continuous nebs. She has had allergy testing in  [place], she was allergic to cats, and several other things. She can not take steroids due to anxiety.  She takes claritin daily. She was asked to start singulair but then decided not to because of her severe anxiety.  She has no pets, she has reflux controlled by prilosec. She has sinus drainage controlled by flonase.   **Chest x-ray 06/13/2018>> changes of chronic bronchitis, suggestive of COPD. **CBC 05/02/2018>> absolute eosinophil count 400. **PSG 01/10/18>>negative.  **HST 12/13/17>> negative.  **Allergy prick testing 10/12/2017>> 3+ allergy to cat.  Otherwise all other inhalants, grasses, weeds, trees, molds negative.  Histamine control positive at 2+. **[facility] allergy testing 04/10/2013>> mildly positive for cat, dog, otherwise negative for all other tested items.  IgE 19.  PMHX:   Past Medical History:  Diagnosis Date  . Abnormal Pap smear of cervix    h/o LEEP  . Allergy   . Angio-edema   . Anxiety   . Asthma   . Cancer ([place])    melanoma 2003  . Constipation   . Depression   . Diarrhea   . Hypertension   . Low back pain   . Melanoma ([place])    right foot s/p lymph node removed right groin  . Obesity (BMI 30-39.9)   . Urticaria   . Vaginal delivery    X 2   Surgical Hx:  Past Surgical History:  Procedure Laterality Date  . BLADDER SUSPENSION  07/2017  .  COLONOSCOPY WITH PROPOFOL N/A 07/01/2017   Procedure: COLONOSCOPY WITH PROPOFOL;  Surgeon: Virgel Manifold, MD;  Location: [facility] ENDOSCOPY;  Service: Endoscopy;  Laterality: N/A;  . ESOPHAGOGASTRODUODENOSCOPY (EGD) WITH PROPOFOL N/A 07/01/2017   Procedure: ESOPHAGOGASTRODUODENOSCOPY (EGD) WITH PROPOFOL;  Surgeon: Virgel Manifold, MD;  Location: [facility] ENDOSCOPY;  Service: Endoscopy;  Laterality: N/A;  . LEEP  1995  . LEEP    . PARTIAL HYSTERECTOMY  08/2017   s/p partial hysterectomy - per patient cervix, uterus, fallopian tubes; ovaries INTACT  . SKIN CANCER EXCISION     melanoma  . TONSILLECTOMY     Family  Hx:  Family History  Problem Relation Age of Onset  . Hyperlipidemia Mother   . Hypertension Mother   . Diabetes Mother   . Breast cancer Mother 67  . Cancer Father        lung  . Depression Father   . Hyperlipidemia Sister   . Hypertension Sister   . Migraines Sister   . Hypertension Brother   . Hyperlipidemia Brother   . Migraines Brother   . Cancer Maternal Grandmother        brain  . Depression Maternal Grandfather   . Heart disease Maternal Grandfather   . Cancer Paternal Grandfather        unknown   Social Hx:   Social History   Tobacco Use  . Smoking status: Former Smoker    Packs/day: 1.00    Years: 20.00    Pack years: 20.00    Types: Cigarettes    Last attempt to quit: 03/09/2008    Years since quitting: 10.4  . Smokeless tobacco: Never Used  Substance Use Topics  . Alcohol use: No  . Drug use: No   Medication:    Current Outpatient Medications:  .  ACCU-CHEK AVIVA PLUS test strip, USE UP TO FOUR TIMES DAILY AS DIRECTED, Disp: 100 each, Rfl: 11 .  ACCU-CHEK SOFTCLIX LANCETS lancets, TEST BLOOD SUGAR UP TO FOUR TIMES DAILY AS DIRECTED, Disp: 400 each, Rfl: 0 .  ACETAMINOPHEN PO, Take by mouth as needed.  , Disp: , Rfl:  .  albuterol (PROVENTIL HFA;VENTOLIN HFA) 108 (90 Base) MCG/ACT inhaler, Inhale 2 puffs into the lungs every 6 (six) hours as needed., Disp: 1 Inhaler, Rfl: 1 .  albuterol (PROVENTIL) (2.5 MG/3ML) 0.083% nebulizer solution, Take 3 mLs (2.5 mg total) by nebulization every 6 (six) hours as needed for wheezing or shortness of breath., Disp: 30 mL, Rfl: 3 .  aspirin EC 81 MG tablet, Take 81 mg by mouth daily., Disp: , Rfl:  .  B Complex Vitamins (BL VITAMIN B COMPLEX PO), Place 5,000 Film under the tongue daily., Disp: , Rfl:  .  Blood Glucose Monitoring Suppl (ACCU-CHEK AVIVA PLUS) w/Device KIT, USE UP TO FOUR TIMES DAILY AS DIRECTED, Disp: 1 kit, Rfl: 0 .  cholecalciferol (VITAMIN D) 400 units TABS tablet, Take 1,000 Units by mouth. , Disp: ,  Rfl:  .  clorazepate (TRANXENE) 7.5 MG tablet, Take 7.5 mg by mouth 3 (three) times daily., Disp: , Rfl:  .  fluticasone (FLONASE) 50 MCG/ACT nasal spray, Place 2 sprays into both nostrils daily., Disp: 1 g, Rfl: 5 .  fluticasone (FLOVENT HFA) 110 MCG/ACT inhaler, Inhale 2 puffs into the lungs 2 (two) times daily., Disp: 3 Inhaler, Rfl: 0 .  loratadine (CLARITIN) 10 MG tablet, TAKE ONE (1) TABLET BY MOUTH EVERY DAY, Disp: 90 tablet, Rfl: 3 .  magnesium hydroxide (MILK OF MAGNESIA) 400 MG/5ML  suspension, Take by mouth daily as needed for mild constipation., Disp: , Rfl:  .  Multiple Vitamin (MULTIVITAMIN) tablet, Take 1 tablet by mouth daily., Disp: , Rfl:  .  Omega-3 Fatty Acids (FISH OIL) 1000 MG CAPS, Take 1 capsule by mouth daily., Disp: , Rfl:  .  omeprazole (PRILOSEC) 20 MG capsule, TAKE 1 CAPSULE DAILY., Disp: 90 capsule, Rfl: 0 .  polyethylene glycol powder (MIRALAX) powder, Take 1 Container by mouth daily., Disp: , Rfl:  .  propranolol (INDERAL) 20 MG tablet, Take 1 tablet (20 mg total) by mouth 3 (three) times daily., Disp: 90 tablet, Rfl: 3 .  psyllium (METAMUCIL) 58.6 % powder, Take 1 packet by mouth daily., Disp: , Rfl:  .  sertraline (ZOLOFT) 100 MG tablet, TAKE 1+1/2 TABLETS BY MOUTH DAILY AS DIRECTED, Disp: 45 tablet, Rfl: 3 .  Spacer/Aero Chamber Mouthpiece MISC, 1 puff by Does not apply route as needed., Disp: 1 each, Rfl: 1 .  naproxen (NAPROSYN) 500 MG tablet, Take 1 tablet (500 mg total) by mouth 2 (two) times daily with a meal. (Patient not taking: Reported on 08/01/2018), Disp: 20 tablet, Rfl: 2   Allergies:  Pork allergy; Pork-derived products; Sulfur; Aciphex [rabeprazole sodium]; Antivert [meclizine hcl]; Buspar [buspirone hcl]; Cat hair extract; Chlorpheniramine-pseudoeph; Chlorpheniramine-pseudoeph; Chocolate flavor; Codeine; Crestor [rosuvastatin calcium]; Cymbalta [duloxetine hcl]; Duloxetine; Effexor xr [venlafaxine hydrochloride]; Epinephrine; Erythromycin;  Escitalopram oxalate; Lamictal [lamotrigine]; Levofloxacin; Lipitor [atorvastatin]; Meclizine; Mometasone furoate; Mometasone furoate; Monosodium glutamate; Neurontin [gabapentin]; Other; Prednisone; Shellfish allergy; Sudafed [pseudoephedrine hcl]; Sulfa antibiotics; Sulfasalazine; Valium; Wellbutrin [bupropion hcl]; Xanax xr [alprazolam]; Alfalfa; Brompheniramine-phenylephrine; Buspirone; and Tape  Review of Systems: Gen:  Denies  fever, sweats, chills HEENT: Denies blurred vision, double vision. bleeds, sore throat Cvc:  No dizziness, chest pain. Resp:   Denies cough or sputum production, shortness of breath Gi: Denies swallowing difficulty, stomach pain. Gu:  Denies bladder incontinence, burning urine Ext:   No Joint pain, stiffness. Skin: No skin rash,  hives  Endoc:  No polyuria, polydipsia. Psych: No depression, insomnia. Other:  All other systems were reviewed with the patient and were negative other that what is mentioned in the HPI.   Physical Examination:   VS: BP 130/82 (BP Location: Left Arm, Cuff Size: Normal)   Pulse 78   Ht 5' 1.5" (1.562 m)   Wt 225 lb 6.4 oz (102.2 kg)   LMP  (LMP Unknown) Comment: urine Preg negative   SpO2 95%   BMI 41.90 kg/m   General Appearance: No distress  Neuro:without focal findings,  speech normal,  HEENT: PERRLA, EOM intact.   Pulmonary: normal breath sounds, No wheezing.  CardiovascularNormal S1,S2.  No m/r/g.   Abdomen: Benign, Soft, non-tender. Renal:  No costovertebral tenderness  GU:  No performed at this time. Endoc: No evident thyromegaly, no signs of acromegaly. Skin:   warm, no rashes, no ecchymosis  Extremities: normal, no cyanosis, clubbing.  Other findings:    LABORATORY PANEL:   CBC No results for input(s): WBC, HGB, HCT, PLT in the last 168 hours. ------------------------------------------------------------------------------------------------------------------  Chemistries  No results for input(s): NA, K, CL,  CO2, GLUCOSE, BUN, CREATININE, CALCIUM, MG, AST, ALT, ALKPHOS, BILITOT in the last 168 hours.  Invalid input(s): GFRCGP ------------------------------------------------------------------------------------------------------------------  Cardiac Enzymes No results for input(s): TROPONINI in the last 168 hours. ------------------------------------------------------------  RADIOLOGY:  No results found.     Thank  you for the consultation and for allowing [facility] to assist in the care of your patient. Our  recommendations are noted above.  Please contact us if we can be of further service.   Marda Stalker, M.D., F.C.C.P.  Board Certified in Internal Medicine, Pulmonary Medicine, [place], and Sleep Medicine.  [facility] Office Number: [phone]   08/01/2018

## 2018-08-01 ENCOUNTER — Telehealth: Payer: Self-pay | Admitting: Internal Medicine

## 2018-08-01 ENCOUNTER — Other Ambulatory Visit: Payer: Self-pay

## 2018-08-01 ENCOUNTER — Encounter: Payer: Self-pay | Admitting: Internal Medicine

## 2018-08-01 ENCOUNTER — Ambulatory Visit (INDEPENDENT_AMBULATORY_CARE_PROVIDER_SITE_OTHER): Payer: Medicare HMO | Admitting: Internal Medicine

## 2018-08-01 VITALS — BP 130/82 | HR 78 | Ht 61.5 in | Wt 225.4 lb

## 2018-08-01 DIAGNOSIS — J4551 Severe persistent asthma with (acute) exacerbation: Secondary | ICD-10-CM | POA: Diagnosis not present

## 2018-08-01 MED ORDER — FLUTICASONE-SALMETEROL 250-50 MCG/DOSE IN AEPB: 1.0000 | INHALATION_SPRAY | Freq: Two times a day (BID) | RESPIRATORY_TRACT | 5 refills | Status: DC

## 2018-08-01 NOTE — Patient Instructions (Signed)
Will start advair 1 puff twice daily, rinse mouth after use. This is in place of flovent so stop flovent.   Will need to obtain results of recently allergy testing.

## 2018-08-01 NOTE — Telephone Encounter (Signed)
Results have been located within Dr. Bruna Potter 10/12/17 office notes.  Results have been printed and given to DR for review. Nothing further is needed.

## 2018-08-01 NOTE — Telephone Encounter (Signed)
I have contacted [facility] [place] and requested that allergy skin test results be faxed to our office.  Unable to locate these results within epic, however I do see the results mentioned within Dr. Bruna Potter 10/12/17 note. Was advised that our office would received faxed results or call back once results are located.

## 2018-08-12 ENCOUNTER — Ambulatory Visit (INDEPENDENT_AMBULATORY_CARE_PROVIDER_SITE_OTHER): Payer: Medicare HMO | Admitting: Family

## 2018-08-12 ENCOUNTER — Telehealth: Payer: Self-pay

## 2018-08-12 ENCOUNTER — Encounter: Payer: Self-pay | Admitting: Family

## 2018-08-12 DIAGNOSIS — J0101 Acute recurrent maxillary sinusitis: Secondary | ICD-10-CM | POA: Diagnosis not present

## 2018-08-12 DIAGNOSIS — J40 Bronchitis, not specified as acute or chronic: Secondary | ICD-10-CM | POA: Diagnosis not present

## 2018-08-12 MED ORDER — AMOXICILLIN 500 MG PO CAPS: 500.0000 mg | ORAL_CAPSULE | Freq: Two times a day (BID) | ORAL | 0 refills | Status: DC

## 2018-08-12 NOTE — Progress Notes (Signed)
This visit type was conducted due to national recommendations for restrictions regarding the COVID-19 pandemic (e.g. social distancing).  This format is felt to be most appropriate for this patient at this time.  All issues noted in this document were discussed and addressed.  No physical exam was performed (except for noted visual exam findings with Video Visits).  Virtual Visit via Video Note  I connected with@ on 08/12/18 at 11:00 AM EDT by a video enabled telemedicine application and verified that I am speaking with the correct person using two identifiers.  Location patient: home Location provider:work or home office Persons participating in the virtual visit: patient, provider  I discussed the limitations of evaluation and management by telemedicine and the availability of in person appointments. The patient expressed understanding and agreed to proceed.   HPI:  CC: congestion x 4 days, unchanged.    Wheezing had 'lightened up some' when started Advair x 4 days ago . Not able to use albuterol neb while on Advair per patient per pharmacy.   rinsing mouth out every time uses inhaler. No white exudate in mouth. Wheezing improved to 'faint' with albuterol nebulizer.  Has had allergy testing.  'Low grade fever',  tmax 98.8 Endorses sore throat , facial pressure, x one day. 'not coughing' but when does it is dark green sputum.   Taking  Tylenol, ibuprofen, claritin, flonase, and tussin  Not around anyone. No travel. Started guafensin yesterday  Blood pressure at home 120/82, HR 66. Sa02 on Smartwatch 95%.   08/01/18 Dr Ashby Dawes; changed from flovent to advair 250. F/u one month. Await allergy testing.   Treated with amoxicillin 07/25/18 with improvement for 4-5 days.   ROS: See pertinent positives and negatives per HPI.  Past Medical History:  Diagnosis Date  . Abnormal Pap smear of cervix    h/o LEEP  . Allergy   . Angio-edema   . Anxiety   . Asthma   . Cancer ([place])     melanoma 2003  . Constipation   . Depression   . Diarrhea   . Hypertension   . Low back pain   . Melanoma ([place])    right foot s/p lymph node removed right groin  . Obesity (BMI 30-39.9)   . Urticaria   . Vaginal delivery    X 2    Past Surgical History:  Procedure Laterality Date  . BLADDER SUSPENSION  07/2017  . COLONOSCOPY WITH PROPOFOL N/A 07/01/2017   Procedure: COLONOSCOPY WITH PROPOFOL;  Surgeon: Virgel Manifold, MD;  Location: [facility] ENDOSCOPY;  Service: Endoscopy;  Laterality: N/A;  . ESOPHAGOGASTRODUODENOSCOPY (EGD) WITH PROPOFOL N/A 07/01/2017   Procedure: ESOPHAGOGASTRODUODENOSCOPY (EGD) WITH PROPOFOL;  Surgeon: Virgel Manifold, MD;  Location: [facility] ENDOSCOPY;  Service: Endoscopy;  Laterality: N/A;  . LEEP  1995  . LEEP    . PARTIAL HYSTERECTOMY  08/2017   s/p partial hysterectomy - per patient cervix, uterus, fallopian tubes; ovaries INTACT  . SKIN CANCER EXCISION     melanoma  . TONSILLECTOMY      Family History  Problem Relation Age of Onset  . Hyperlipidemia Mother   . Hypertension Mother   . Diabetes Mother   . Breast cancer Mother 82  . Cancer Father        lung  . Depression Father   . Hyperlipidemia Sister   . Hypertension Sister   . Migraines Sister   . Hypertension Brother   . Hyperlipidemia Brother   . Migraines Brother   .  Cancer Maternal Grandmother        brain  . Depression Maternal Grandfather   . Heart disease Maternal Grandfather   . Cancer Paternal Grandfather        unknown    SOCIAL HX: former smoker   Current Outpatient Medications:  .  ACCU-CHEK AVIVA PLUS test strip, USE UP TO FOUR TIMES DAILY AS DIRECTED, Disp: 100 each, Rfl: 11 .  ACCU-CHEK SOFTCLIX LANCETS lancets, TEST BLOOD SUGAR UP TO FOUR TIMES DAILY AS DIRECTED, Disp: 400 each, Rfl: 0 .  ACETAMINOPHEN PO, Take by mouth as needed.  , Disp: , Rfl:  .  albuterol (PROVENTIL HFA;VENTOLIN HFA) 108 (90 Base) MCG/ACT inhaler, Inhale 2 puffs into the lungs every 6  (six) hours as needed., Disp: 1 Inhaler, Rfl: 1 .  albuterol (PROVENTIL) (2.5 MG/3ML) 0.083% nebulizer solution, Take 3 mLs (2.5 mg total) by nebulization every 6 (six) hours as needed for wheezing or shortness of breath., Disp: 30 mL, Rfl: 3 .  amoxicillin (AMOXIL) 500 MG capsule, Take 1 capsule (500 mg total) by mouth 2 (two) times daily., Disp: 14 capsule, Rfl: 0 .  aspirin EC 81 MG tablet, Take 81 mg by mouth daily., Disp: , Rfl:  .  B Complex Vitamins (BL VITAMIN B COMPLEX PO), Place 5,000 Film under the tongue daily., Disp: , Rfl:  .  Blood Glucose Monitoring Suppl (ACCU-CHEK AVIVA PLUS) w/Device KIT, USE UP TO FOUR TIMES DAILY AS DIRECTED, Disp: 1 kit, Rfl: 0 .  cholecalciferol (VITAMIN D) 400 units TABS tablet, Take 1,000 Units by mouth. , Disp: , Rfl:  .  clorazepate (TRANXENE) 7.5 MG tablet, Take 7.5 mg by mouth 3 (three) times daily., Disp: , Rfl:  .  fluticasone (FLONASE) 50 MCG/ACT nasal spray, Place 2 sprays into both nostrils daily., Disp: 1 g, Rfl: 5 .  fluticasone (FLOVENT HFA) 110 MCG/ACT inhaler, Inhale 2 puffs into the lungs 2 (two) times daily., Disp: 3 Inhaler, Rfl: 0 .  Fluticasone-Salmeterol (ADVAIR DISKUS) 250-50 MCG/DOSE AEPB, Inhale 1 puff into the lungs 2 (two) times daily., Disp: 1 each, Rfl: 5 .  loratadine (CLARITIN) 10 MG tablet, TAKE ONE (1) TABLET BY MOUTH EVERY DAY, Disp: 90 tablet, Rfl: 3 .  magnesium hydroxide (MILK OF MAGNESIA) 400 MG/5ML suspension, Take by mouth daily as needed for mild constipation., Disp: , Rfl:  .  Multiple Vitamin (MULTIVITAMIN) tablet, Take 1 tablet by mouth daily., Disp: , Rfl:  .  naproxen (NAPROSYN) 500 MG tablet, Take 1 tablet (500 mg total) by mouth 2 (two) times daily with a meal. (Patient not taking: Reported on 08/01/2018), Disp: 20 tablet, Rfl: 2 .  Omega-3 Fatty Acids (FISH OIL) 1000 MG CAPS, Take 1 capsule by mouth daily., Disp: , Rfl:  .  omeprazole (PRILOSEC) 20 MG capsule, TAKE 1 CAPSULE DAILY., Disp: 90 capsule, Rfl: 0 .   polyethylene glycol powder (MIRALAX) powder, Take 1 Container by mouth daily., Disp: , Rfl:  .  propranolol (INDERAL) 20 MG tablet, Take 1 tablet (20 mg total) by mouth 3 (three) times daily., Disp: 90 tablet, Rfl: 3 .  psyllium (METAMUCIL) 58.6 % powder, Take 1 packet by mouth daily., Disp: , Rfl:  .  sertraline (ZOLOFT) 100 MG tablet, TAKE 1+1/2 TABLETS BY MOUTH DAILY AS DIRECTED, Disp: 45 tablet, Rfl: 3 .  Spacer/Aero Chamber Mouthpiece MISC, 1 puff by Does not apply route as needed., Disp: 1 each, Rfl: 1  EXAM:  VITALS per patient if applicable:  GENERAL: alert, oriented, appears  well and in no acute distress  HEENT: atraumatic, conjunttiva clear, no obvious abnormalities on inspection of external nose and ears.  She open mouth during WebEx, no white exudate, profound erythema or tonsillar swelling was appreciated.  NECK: normal movements of the head and neck  LUNGS: on inspection no signs of respiratory distress, breathing rate appears normal, no obvious gross SOB, gasping or wheezing  CV: no obvious cyanosis  MS: moves all visible extremities without noticeable abnormality  PSYCH/NEURO: pleasant and cooperative, no obvious depression or anxiety, speech and thought processing grossly intact  ASSESSMENT AND PLAN:  Discussed the following assessment and plan:  Acute recurrent maxillary sinusitis - Plan: amoxicillin (AMOXIL) 500 MG capsule  Bronchitis     I discussed the assessment and treatment plan with the patient. The patient was provided an opportunity to ask questions and all were answered. The patient agreed with the plan and demonstrated an understanding of the instructions.   The patient was advised to call back or seek an in-person evaluation if the symptoms worsen or if the condition fails to improve as anticipated.  I provided 25 minutes of non-face-to-face time during this encounter.   Mable Paris, FNP

## 2018-08-12 NOTE — Progress Notes (Signed)
I spoke with Dr. Mathis Fare nurse & gave her my information to confirm that it was ok for patient to use neb as well as advair diskus together.

## 2018-08-12 NOTE — Patient Instructions (Addendum)
Awaiting to clarify if can do albuterol nebulized treatments with the Advair.  We will get back to you  HOLD for now claritin. Continue flonase.   Continue guafensin with plenty of water  I suspect that your infection is viral in nature.  As discussed, I advise that you wait to fill the antibiotic ( amoxicilin) after 1-2 days of symptom management to see if your symptoms improve. If you do not show improvement, you may take the antibiotic as prescribed.  Let me know how you are doing, and it is most important you continue to follow with Dr. Ashby Dawes . He wanted to see you in 1 month so make sure, you have a follow-up scheduled

## 2018-08-12 NOTE — Telephone Encounter (Signed)
Continue advair daily. May use albuterol neb as needed for dyspnea in addition to advair.

## 2018-08-12 NOTE — Telephone Encounter (Signed)
Sarah from [facility] called. Dr. Vidal Schwalbe saw this patient via telephone visit today. She has sent a message to Dr. Ashby Dawes, question if she can do the albuterol nebulizer with the advair discuss. It said on rx for advair that she could not do it along with the neb. Will send to Dr. Saunders Revel to confirm and let Dr. Harlan Stains office know response.

## 2018-08-12 NOTE — Assessment & Plan Note (Signed)
At home, vitals patient took are reassuring.  On video, no acute respiratory stress.  Patient is not labored in speech.  She is well-appearing, nontoxic in appearance.  Duration of symptoms approximately 4 days.  Explained to patient likely viral versus allergy etiology.  She has had repeated doses of antibiotics, we discussed resistance.  We jointly agreed that trial conservative management is reasonable at this time, patient will continue guaifenesin that she started yesterday, I will clarify as whether or not she can use nebulizer treatments while on the Advair safely.  My nurse has called Dr. Mathis Fare office as well as sent him them a note via Epic.  Since patient did have improvement with amoxicillin, although short-lived, we agreed if conservative therapy fails over the weekend, she may start amoxicillin again as perhaps a second dose of this may be effective.  She will continue to follow with pulmonology.  She will let me know how she is doing.

## 2018-08-12 NOTE — Telephone Encounter (Signed)
Patient states she just started her Advair 4 days ago, and the wheezing has returned, however, it is not as bad as it originally was.  Flovent twice in am and twice in pm was controlling the wheezing, as well as the anitbiotic. Per PCP she is going to give it a few more days and see how it does. If she continues to feel bad, she has an antibiotic to fill this weekend. Nothing further needed.

## 2018-08-15 ENCOUNTER — Institutional Professional Consult (permissible substitution): Payer: Medicare HMO | Admitting: Pulmonary Disease

## 2018-08-15 NOTE — Progress Notes (Signed)
That must have been a misunderstanding, she can absolutely use albuterol nebs as needed while doing advair.

## 2018-08-22 ENCOUNTER — Telehealth: Payer: Self-pay | Admitting: Family

## 2018-08-22 NOTE — Telephone Encounter (Signed)
Copied from [place] [phone]. Topic: Quick Communication - See Telephone Encounter >> Aug 22, 2018  1:37 PM Robina Ade, Helene Kelp D wrote: CRM for notification. See Telephone encounter for: 08/22/18. Patient called and said that she is on her last day of antibiotic but said she is still not feeling better. She wants to know if there will be another round of it or what else can she do or take. She would like a call back from [place] or her CMA.

## 2018-08-22 NOTE — Telephone Encounter (Signed)
Call pt   I think we might be premature here, I understand  her history is that she gets longer/ repeated courses of antibiotics however if she is feeling better, the idea is that we have treated the underlying bacterial infection.  I am very concerned for bacterial resistance.   I really would prefer her to give this more time.    See if she is agreeable to WebEx on Wednesday.

## 2018-08-22 NOTE — Telephone Encounter (Signed)
Call pt Get more info  sch web ex

## 2018-08-22 NOTE — Telephone Encounter (Signed)
Patient scheduled for Webex at [facility] Wednesday.

## 2018-08-24 ENCOUNTER — Encounter: Payer: Self-pay | Admitting: Family

## 2018-08-24 ENCOUNTER — Ambulatory Visit (INDEPENDENT_AMBULATORY_CARE_PROVIDER_SITE_OTHER): Payer: Medicare HMO | Admitting: Family

## 2018-08-24 DIAGNOSIS — J0101 Acute recurrent maxillary sinusitis: Secondary | ICD-10-CM

## 2018-08-24 DIAGNOSIS — J45901 Unspecified asthma with (acute) exacerbation: Secondary | ICD-10-CM | POA: Diagnosis not present

## 2018-08-24 DIAGNOSIS — C4371 Malignant melanoma of right lower limb, including hip: Secondary | ICD-10-CM

## 2018-08-24 MED ORDER — MONTELUKAST SODIUM 10 MG PO TABS: 10.0000 mg | ORAL_TABLET | Freq: Every day | ORAL | 3 refills | Status: DC

## 2018-08-24 NOTE — Progress Notes (Signed)
This visit type was conducted due to national recommendations for restrictions regarding the COVID-19 pandemic (e.g. social distancing).  This format is felt to be most appropriate for this patient at this time.  All issues noted in this document were discussed and addressed.  No physical exam was performed (except for noted visual exam findings with Video Visits). Virtual Visit via Video Note  I connected with@ on 08/29/18 at  9:00 AM EDT by a video enabled telemedicine application and verified that I am speaking with the correct person using two identifiers.  Location patient: home Location provider:work  Persons participating in the virtual visit: patient, provider  I discussed the limitations of evaluation and management by telemedicine and the availability of in person appointments. The patient expressed understanding and agreed to proceed.   HPI: CC: productive cough x , improved.  'less sputum'. Still has sore throat and hoarseness, sinus pressure, post nasal drip. No fever, CP, leg swelling.   Wheezing has become 'light' .  Occassionally feels 'winded' such as vacuuming, doing laundry which is not normal.  The past several weeks, she has noted that she is not as active as she has been in the past particularly with plantar fasciitis.   Doing nebulizer once per day which relieves wheezing.  Has been using inhaler. Compliant with Advair.  Has started on loratadine.    BP at home today 117/87, HR 71.   Treated via telemedicine on 3/27 for suspected acute maxillary sinusitis, given amoxicillin at that time which was helpful.  She completed amoxicillin 2 days ago.  04/2018- on doxycycline and amoxicillin for PNA.   Treated with augmentin in 05/2018. Treated with amoxicllin 07/2018  Has seen allergist Kozlow, 09/2017.  Prescribed Singulair at that time however never started as she was concerned it may worsen her anxiety  CXR 06/13/2018- no acute findings.  ROS: See pertinent positives and  negatives per HPI.  Past Medical History:  Diagnosis Date  . Abnormal Pap smear of cervix    h/o LEEP  . Allergy   . Angio-edema   . Anxiety   . Asthma   . Cancer ([place])    melanoma 2003  . Constipation   . Depression   . Diarrhea   . Hypertension   . Low back pain   . Melanoma ([place])    right foot s/p lymph node removed right groin  . Obesity (BMI 30-39.9)   . Urticaria   . Vaginal delivery    X 2    Past Surgical History:  Procedure Laterality Date  . BLADDER SUSPENSION  07/2017  . COLONOSCOPY WITH PROPOFOL N/A 07/01/2017   Procedure: COLONOSCOPY WITH PROPOFOL;  Surgeon: Virgel Manifold, MD;  Location: [facility] ENDOSCOPY;  Service: Endoscopy;  Laterality: N/A;  . ESOPHAGOGASTRODUODENOSCOPY (EGD) WITH PROPOFOL N/A 07/01/2017   Procedure: ESOPHAGOGASTRODUODENOSCOPY (EGD) WITH PROPOFOL;  Surgeon: Virgel Manifold, MD;  Location: [facility] ENDOSCOPY;  Service: Endoscopy;  Laterality: N/A;  . LEEP  1995  . LEEP    . PARTIAL HYSTERECTOMY  08/2017   s/p partial hysterectomy - per patient cervix, uterus, fallopian tubes; ovaries INTACT  . SKIN CANCER EXCISION     melanoma  . TONSILLECTOMY      Family History  Problem Relation Age of Onset  . Hyperlipidemia Mother   . Hypertension Mother   . Diabetes Mother   . Breast cancer Mother 85  . Cancer Father        lung  . Depression Father   . Hyperlipidemia  Sister   . Hypertension Sister   . Migraines Sister   . Hypertension Brother   . Hyperlipidemia Brother   . Migraines Brother   . Cancer Maternal Grandmother        brain  . Depression Maternal Grandfather   . Heart disease Maternal Grandfather   . Cancer Paternal Grandfather        unknown    SOCIAL HX: former smoker   Current Outpatient Medications:  .  ACCU-CHEK AVIVA PLUS test strip, USE UP TO FOUR TIMES DAILY AS DIRECTED, Disp: 100 each, Rfl: 11 .  ACCU-CHEK SOFTCLIX LANCETS lancets, TEST BLOOD SUGAR UP TO FOUR TIMES DAILY AS DIRECTED, Disp: 400 each, Rfl:  0 .  ACETAMINOPHEN PO, Take by mouth as needed.  , Disp: , Rfl:  .  albuterol (PROVENTIL HFA;VENTOLIN HFA) 108 (90 Base) MCG/ACT inhaler, Inhale 2 puffs into the lungs every 6 (six) hours as needed., Disp: 1 Inhaler, Rfl: 1 .  albuterol (PROVENTIL) (2.5 MG/3ML) 0.083% nebulizer solution, Take 3 mLs (2.5 mg total) by nebulization every 6 (six) hours as needed for wheezing or shortness of breath., Disp: 30 mL, Rfl: 3 .  amoxicillin (AMOXIL) 500 MG capsule, Take 1 capsule (500 mg total) by mouth 2 (two) times daily., Disp: 14 capsule, Rfl: 0 .  aspirin EC 81 MG tablet, Take 81 mg by mouth daily., Disp: , Rfl:  .  B Complex Vitamins (BL VITAMIN B COMPLEX PO), Place 5,000 Film under the tongue daily., Disp: , Rfl:  .  Blood Glucose Monitoring Suppl (ACCU-CHEK AVIVA PLUS) w/Device KIT, USE UP TO FOUR TIMES DAILY AS DIRECTED, Disp: 1 kit, Rfl: 0 .  cholecalciferol (VITAMIN D) 400 units TABS tablet, Take 1,000 Units by mouth. , Disp: , Rfl:  .  clorazepate (TRANXENE) 7.5 MG tablet, Take 7.5 mg by mouth 3 (three) times daily., Disp: , Rfl:  .  fluticasone (FLONASE) 50 MCG/ACT nasal spray, Place 2 sprays into both nostrils daily., Disp: 1 g, Rfl: 5 .  fluticasone (FLOVENT HFA) 110 MCG/ACT inhaler, Inhale 2 puffs into the lungs 2 (two) times daily., Disp: 3 Inhaler, Rfl: 0 .  Fluticasone-Salmeterol (ADVAIR DISKUS) 250-50 MCG/DOSE AEPB, Inhale 1 puff into the lungs 2 (two) times daily., Disp: 1 each, Rfl: 5 .  loratadine (CLARITIN) 10 MG tablet, TAKE ONE (1) TABLET BY MOUTH EVERY DAY, Disp: 90 tablet, Rfl: 3 .  magnesium hydroxide (MILK OF MAGNESIA) 400 MG/5ML suspension, Take by mouth daily as needed for mild constipation., Disp: , Rfl:  .  montelukast (SINGULAIR) 10 MG tablet, Take 1 tablet (10 mg total) by mouth at bedtime., Disp: 30 tablet, Rfl: 3 .  Multiple Vitamin (MULTIVITAMIN) tablet, Take 1 tablet by mouth daily., Disp: , Rfl:  .  naproxen (NAPROSYN) 500 MG tablet, Take 1 tablet (500 mg total) by  mouth 2 (two) times daily with a meal. (Patient not taking: Reported on 08/01/2018), Disp: 20 tablet, Rfl: 2 .  Omega-3 Fatty Acids (FISH OIL) 1000 MG CAPS, Take 1 capsule by mouth daily., Disp: , Rfl:  .  omeprazole (PRILOSEC) 20 MG capsule, TAKE 1 CAPSULE DAILY., Disp: 90 capsule, Rfl: 0 .  polyethylene glycol powder (MIRALAX) powder, Take 1 Container by mouth daily., Disp: , Rfl:  .  propranolol (INDERAL) 20 MG tablet, Take 1 tablet (20 mg total) by mouth 3 (three) times daily., Disp: 90 tablet, Rfl: 3 .  psyllium (METAMUCIL) 58.6 % powder, Take 1 packet by mouth daily., Disp: , Rfl:  .  sertraline (ZOLOFT) 100 MG tablet, TAKE 1+1/2 TABLETS BY MOUTH DAILY AS DIRECTED, Disp: 45 tablet, Rfl: 3 .  Spacer/Aero Chamber Mouthpiece MISC, 1 puff by Does not apply route as needed., Disp: 1 each, Rfl: 1  EXAM:  VITALS per patient if applicable: 092/33, HR 71.   GENERAL: alert, oriented, appears well and in no acute distress  HEENT: atraumatic, conjunttiva clear, no obvious abnormalities on inspection of external nose and ears  NECK: normal movements of the head and neck  LUNGS: on inspection no signs of respiratory distress, breathing rate appears normal, no obvious gross SOB, gasping or wheezing  CV: no obvious cyanosis  MS: moves all visible extremities without noticeable abnormality  PSYCH/NEURO: pleasant and cooperative, no obvious depression or anxiety, speech and thought processing grossly intact  ASSESSMENT AND PLAN:  Discussed the following assessment and plan:  Exacerbation of asthma, unspecified asthma severity, unspecified whether persistent - Plan: montelukast (SINGULAIR) 10 MG tablet, Ambulatory referral to Allergy, DG Chest 2 View  Acute recurrent maxillary sinusitis - Plan: Ambulatory referral to Allergy  Problem List Items Addressed This Visit      Respiratory   Exacerbation of asthma - Primary    Improved.  No acute respiratory distress.  She is not labored in her  speech.  She speaks in fluent sentences on webex.  Long discussion with patient about my concern for recurrent asthma exacerbations, multiple antibiotics, and really no improvement for antibiotic therapy.  We jointly agreed today that likely need bacterial component has been covered with amoxicillin.  She is afebrile.  No acute respiratory distress.  She does note that she feels "winded" with typical house chores, discussed  likely from uncontrolled asthma as well as overall deconditioning. Looked up adverse effect of Singulair, no data to support increased anxiety, advised patient to trial medication with extreme caution to see if her anxiety were to change, worsen.  She is very open to doing this.  Advised that we really need to get her allergies under control to prevent recurrent asthma exacerbations while needed for wheezing.  She will also use nebulizer treatments as prescribed every 6-8 hours.  Refer her to a different allergist, she will continue to follow with pulmonology.  Of note, I ordered a chest x-ray, patient politely declines doing this today, she would like to see how she feels with Singulair and see how today tomorrow goes.  I think this is appropriate based on pandemic.  She understands to go to [facility] outpatient.  She will let me know how she is doing      Relevant Medications   montelukast (SINGULAIR) 10 MG tablet   Other Relevant Orders   Ambulatory referral to Allergy   DG Chest 2 View (Completed)    Other Visit Diagnoses    Acute recurrent maxillary sinusitis       Relevant Orders   Ambulatory referral to Allergy        I discussed the assessment and treatment plan with the patient. The patient was provided an opportunity to ask questions and all were answered. The patient agreed with the plan and demonstrated an understanding of the instructions.   The patient was advised to call back or seek an in-person evaluation if the symptoms worsen or if the condition fails to  improve as anticipated.     Mable Paris, FNP

## 2018-08-24 NOTE — Progress Notes (Signed)
I spoke with patient & she stated that she was going to try to go to the hospital to get xray done. She was still worried about singuliar causing a panic attack, but she is going to try it.

## 2018-08-24 NOTE — Patient Instructions (Addendum)
Start singulair.   Increase nebulizer use to every 6-8 hours as tolerated.    Please let me know how  Today we discussed referrals, orders. Allergy.      I have placed these orders in the system for you.  Please be sure to give [name] a call if you have not heard from our office regarding this. We should hear from [name] within ONE week with information regarding your appointment. If not, please let me know immediately.

## 2018-08-24 NOTE — Assessment & Plan Note (Addendum)
Improved.  No acute respiratory distress.  She is not labored in her speech.  She speaks in fluent sentences on webex.  Long discussion with patient about my concern for recurrent asthma exacerbations, multiple antibiotics, and really no improvement for antibiotic therapy.  We jointly agreed today that likely need bacterial component has been covered with amoxicillin.  She is afebrile.  No acute respiratory distress.  She does note that she feels "winded" with typical house chores, discussed  likely from uncontrolled asthma as well as overall deconditioning. Looked up adverse effect of Singulair, no data to support increased anxiety, advised patient to trial medication with extreme caution to see if her anxiety were to change, worsen.  She is very open to doing this.  Advised that we really need to get her allergies under control to prevent recurrent asthma exacerbations while needed for wheezing.  She will also use nebulizer treatments as prescribed every 6-8 hours.  Refer her to a different allergist, she will continue to follow with pulmonology.  Of note, I ordered a chest x-ray, patient politely declines doing this today, she would like to see how she feels with Singulair and see how today tomorrow goes.  I think this is appropriate based on pandemic.  She understands to go to [facility] outpatient.  She will let me know how she is doing

## 2018-08-25 ENCOUNTER — Ambulatory Visit
Admission: RE | Admit: 2018-08-25 | Discharge: 2018-08-25 | Disposition: A | Discharge status: Home / Self Care | Payer: Medicare HMO | Attending: Family | Admitting: Family

## 2018-08-25 ENCOUNTER — Telehealth: Payer: Self-pay

## 2018-08-25 ENCOUNTER — Ambulatory Visit
Admission: RE | Admit: 2018-08-25 | Discharge: 2018-08-25 | Disposition: A | Discharge status: Home / Self Care | Payer: Medicare HMO | Source: Ambulatory Visit | Attending: Family | Admitting: Family

## 2018-08-25 ENCOUNTER — Other Ambulatory Visit: Payer: Self-pay

## 2018-08-25 DIAGNOSIS — J45901 Unspecified asthma with (acute) exacerbation: Secondary | ICD-10-CM

## 2018-08-25 DIAGNOSIS — R05 Cough: Secondary | ICD-10-CM | POA: Diagnosis not present

## 2018-08-25 DIAGNOSIS — R0602 Shortness of breath: Secondary | ICD-10-CM | POA: Diagnosis not present

## 2018-08-25 NOTE — Telephone Encounter (Signed)
Tried to call patient and explain we are not testing for COVID in office Covid testing would need to be done at a UC or ED no answer left message to call office back.

## 2018-08-25 NOTE — Telephone Encounter (Signed)
Copied from [place] [id number]. Topic: General - Inquiry >> Aug 24, 2018  4:24 PM Rainey Pines A wrote: Reason for CRM: Patient would like a call back from [facility].Patient stated that she was ordered a chest x-ray and wanted to know should she be tested for COVID while she was there so that she doesn't have to go back out. Patient would like a callback. Best contact number is [phone]

## 2018-08-25 NOTE — Telephone Encounter (Signed)
Called pt back to give criteria for COVID testing.  No answer.  LMTCB.  Will forward notes to PCP's CMA to follow-up on question.

## 2018-08-26 ENCOUNTER — Encounter: Payer: Self-pay | Admitting: Family

## 2018-08-29 DIAGNOSIS — F39 Unspecified mood [affective] disorder: Secondary | ICD-10-CM | POA: Diagnosis not present

## 2018-08-29 DIAGNOSIS — F410 Panic disorder [episodic paroxysmal anxiety] without agoraphobia: Secondary | ICD-10-CM | POA: Diagnosis not present

## 2018-08-29 DIAGNOSIS — F4001 Agoraphobia with panic disorder: Secondary | ICD-10-CM | POA: Diagnosis not present

## 2018-08-30 ENCOUNTER — Telehealth: Payer: Self-pay | Admitting: Internal Medicine

## 2018-08-30 NOTE — Telephone Encounter (Signed)
Left for pt regarding upcoming appt for 09/01/18. Will need to offer telephone/virtual or reschedule out to June, as DR is not currently seeing pt's in office at this time.

## 2018-08-31 ENCOUNTER — Telehealth: Payer: Self-pay | Admitting: Cardiovascular Disease

## 2018-08-31 NOTE — Telephone Encounter (Signed)
Virtual Visit Pre-Appointment Phone Call  Steps For Call:  1. Confirm consent - "In the setting of the current Covid19 crisis, you are scheduled for a (phone or video) visit with your provider on (date) at (time).  Just as we do with many in-office visits, in order for you to participate in this visit, we must obtain consent.  If you'd like, I can send this to your mychart (if signed up) or email for you to review.  Otherwise, I can obtain your verbal consent now.  All virtual visits are billed to your insurance company just like a normal visit would be.  By agreeing to a virtual visit, we'd like you to understand that the technology does not allow for your provider to perform an examination, and thus may limit your provider's ability to fully assess your condition.  Finally, though the technology is pretty good, we cannot assure that it will always work on either your or our end, and in the setting of a video visit, we may have to convert it to a phone-only visit.  In either situation, we cannot ensure that we have a secure connection.  Are you willing to proceed?" STAFF: Did the patient verbally acknowledge consent to telehealth visit? Document YES/NO here: Yes   2. Confirm the BEST phone number to call the day of the visit by including in appointment notes  3. Give patient instructions for WebEx/MyChart download to smartphone as below or Doximity/Doxy.me if video visit (depending on what platform provider is using)  4. Advise patient to be prepared with their blood pressure, heart rate, weight, any heart rhythm information, their current medicines, and a piece of paper and pen handy for any instructions they may receive the day of their visit  5. Inform patient they will receive a phone call 15 minutes prior to their appointment time (may be from unknown caller ID) so they should be prepared to answer  6. Confirm that appointment type is correct in Epic appointment notes (VIDEO vs  PHONE)     TELEPHONE CALL NOTE  Brittney Ruiz has been deemed a candidate for a follow-up tele-health visit to limit community exposure during the Covid-19 pandemic. I spoke with the patient via phone to ensure availability of phone/video source, confirm preferred email & phone number, and discuss instructions and expectations.  I reminded AVIONNA BOWER to be prepared with any vital sign and/or heart rhythm information that could potentially be obtained via home monitoring, at the time of her visit. I reminded RHIANNA RAULERSON to expect a phone call at the time of her visit if her visit.  Caryl Pina Gerringer 08/31/2018 11:14 AM   INSTRUCTIONS FOR DOWNLOADING THE WEBEX APP TO SMARTPHONE  - If Apple, ask patient to go to App Store and type in WebEx in the search bar. [place] [employer], the blue/green circle. If Android, go to [employer] and type in [employer] in the search bar. The app is free but as with any other app downloads, their phone may require them to verify saved payment information or Apple/Android password.  - The patient does NOT have to create an account. - On the day of the visit, the assist will walk the patient through joining the meeting with the meeting number/password.  INSTRUCTIONS FOR DOWNLOADING THE MYCHART APP TO SMARTPHONE  - The patient must first make sure to have activated MyChart and know their login information - If Apple, go to [employer] and type in  MyChart in the search bar and download the app. If Android, ask patient to go to [employer] and type in [place] in the search bar and download the app. The app is free but as with any other app downloads, their phone may require them to verify saved payment information or Apple/Android password.  - The patient will need to then log into the app with their MyChart username and password, and select [facility] as their healthcare provider to link the account. When it is time for your visit, go to the MyChart  app, find appointments, and click Begin Video Visit. Be sure to Select Allow for your device to access the Microphone and Camera for your visit. You will then be connected, and your provider will be with you shortly.  **If they have any issues connecting, or need assistance please contact MyChart service desk [phone])**  **If using a computer, in order to ensure the best quality for their visit they will need to use either of the following Internet Browsers: [employer], or Google Chrome**  IF USING DOXIMITY or DOXY.ME - The patient will receive a link just prior to their visit, either by text or email (to be determined day of appointment depending on if it's doxy.me or Doximity).     FULL LENGTH CONSENT FOR TELE-HEALTH VISIT   I hereby voluntarily request, consent and authorize [facility] and its employed or contracted physicians, physician assistants, nurse practitioners or other licensed health care professionals (the Practitioner), to provide me with telemedicine health care services (the Services") as deemed necessary by the treating Practitioner. I acknowledge and consent to receive the Services by the Practitioner via telemedicine. I understand that the telemedicine visit will involve communicating with the Practitioner through live audiovisual communication technology and the disclosure of certain medical information by electronic transmission. I acknowledge that I have been given the opportunity to request an in-person assessment or other available alternative prior to the telemedicine visit and am voluntarily participating in the telemedicine visit.  I understand that I have the right to withhold or withdraw my consent to the use of telemedicine in the course of my care at any time, without affecting my right to future care or treatment, and that the Practitioner or I may terminate the telemedicine visit at any time. I understand that I have the right to inspect all  information obtained and/or recorded in the course of the telemedicine visit and may receive copies of available information for a reasonable fee.  I understand that some of the potential risks of receiving the Services via telemedicine include:   Delay or interruption in medical evaluation due to technological equipment failure or disruption;  Information transmitted may not be sufficient (e.g. poor resolution of images) to allow for appropriate medical decision making by the Practitioner; and/or   In rare instances, security protocols could fail, causing a breach of personal health information.  Furthermore, I acknowledge that it is my responsibility to provide information about my medical history, conditions and care that is complete and accurate to the best of my ability. I acknowledge that Practitioner's advice, recommendations, and/or decision may be based on factors not within their control, such as incomplete or inaccurate data provided by me or distortions of diagnostic images or specimens that may result from electronic transmissions. I understand that the practice of medicine is not an exact science and that Practitioner makes no warranties or guarantees regarding treatment outcomes. I acknowledge that I will receive a copy  of this consent concurrently upon execution via email to the email address I last provided but may also request a printed copy by calling the office of [facility].    I understand that my insurance will be billed for this visit.   I have read or had this consent read to me.  I understand the contents of this consent, which adequately explains the benefits and risks of the Services being provided via telemedicine.   I have been provided ample opportunity to ask questions regarding this consent and the Services and have had my questions answered to my satisfaction.  I give my informed consent for the services to be provided through the use of telemedicine in my  medical care  By participating in this telemedicine visit I agree to the above.

## 2018-08-31 NOTE — Telephone Encounter (Signed)
lmtcb x2 for pt. 

## 2018-09-01 ENCOUNTER — Ambulatory Visit: Payer: Medicare HMO | Admitting: Internal Medicine

## 2018-09-07 NOTE — Telephone Encounter (Signed)
appt has been rescheduled to 11/05/18.

## 2018-09-09 ENCOUNTER — Encounter: Payer: Self-pay | Admitting: Cardiovascular Disease

## 2018-09-09 ENCOUNTER — Other Ambulatory Visit: Payer: Self-pay

## 2018-09-09 ENCOUNTER — Telehealth (INDEPENDENT_AMBULATORY_CARE_PROVIDER_SITE_OTHER): Payer: Medicare HMO | Admitting: Cardiovascular Disease

## 2018-09-09 VITALS — HR 73 | Ht 62.0 in | Wt 217.0 lb

## 2018-09-09 DIAGNOSIS — I059 Rheumatic mitral valve disease, unspecified: Secondary | ICD-10-CM

## 2018-09-09 NOTE — Patient Instructions (Signed)
Medication Instructions:  No changes  If you need a refill on your cardiac medications before your next appointment, please call your pharmacy.   Lab work: None ordered  Testing/Procedures: None ordered  Follow-Up: At [facility], you and your health needs are our priority.  As part of our continuing mission to provide you with exceptional heart care, we have created designated Provider Care Teams.  These Care Teams include your primary Cardiologist (physician) and Advanced Practice Providers (APPs -  Physician Assistants and Nurse Practitioners) who all work together to provide you with the care you need, when you need it. You will need a follow up appointment in 12 months.  Please call our office 2 months in advance to schedule this appointment.  You may see Muhammad Arida, MD or one of the following Advanced Practice Providers on your designated Care Team:   Christopher Berge, NP Ryan Dunn, PA-C . Jacquelyn Visser, PA-C    

## 2018-09-09 NOTE — Progress Notes (Signed)
Virtual Visit via Video Note   This visit type was conducted due to national recommendations for restrictions regarding the COVID-19 Pandemic (e.g. social distancing) in an effort to limit this patient's exposure and mitigate transmission in our community.  Due to her co-morbid illnesses, this patient is at least at moderate risk for complications without adequate follow up.  This format is felt to be most appropriate for this patient at this time.  All issues noted in this document were discussed and addressed.  A limited physical exam was performed with this format.  Please refer to the patient's chart for her consent to telehealth for [facility].   Evaluation Performed:  Follow-up visit  Date:  09/09/2018   ID:  Brittney Ruiz, DOB Jan 03, 1966, MRN [id number]  Patient Location: Home Provider Location: Office  PCP:  Burnard Hawthorne, FNP  Cardiologist:  No primary care provider on file. Electrophysiologist:  None   Chief Complaint: Follow-up visit.  History of Present Illness:    Brittney Ruiz is a 53 y.o. female who was seen via video conferencing for follow-up visit regarding chest pain and mitral valve prolapse.  She has history of hypertension, hyperlipidemia intolerant to atorvastatin secondary to weight gain and Crestor secondary to uncertain reasons, multiple medication intolerances (39 allergies/intolerances noted to time of her office visit) prior tobacco abuse, asthma, obesity, anxiety, and snoring without evidence of obstructive sleep apnea on recent sleep study in 2019 .  Previous echocardiogram in 2017 showed an EF of 60 to 65%, no regional wall motion abnormalities, mild coaptation of the mitral valve leaflets with mild to moderate regurgitation, mildly dilated left atrium, PASP 35 mmHg.  Nuclear stress testing at that time showed no significant ischemia, EF 53%, low risk scan.   She had recurrent episodes of bronchitis and pneumonia starting in December with  associated atypical chest pain.  She was seen by Christell Faith.  She underwent CTA of the coronary arteries which showed normal coronary arteries with calcium score of 0.  Echocardiogram in February showed normal LV systolic function with no significant valvular abnormalities. She is feeling significantly better with no chest pain.  She reports significant improvement in shortness of breath.   The patient does not have symptoms concerning for COVID-19 infection (fever, chills, cough, or new shortness of breath).    Past Medical History:  Diagnosis Date  . Abnormal Pap smear of cervix    h/o LEEP  . Allergy   . Angio-edema   . Anxiety   . Asthma   . Cancer ([place])    melanoma 2003  . Constipation   . Depression   . Diarrhea   . Hypertension   . Low back pain   . Melanoma ([place])    right foot s/p lymph node removed right groin  . Obesity (BMI 30-39.9)   . Urticaria   . Vaginal delivery    X 2   Past Surgical History:  Procedure Laterality Date  . BLADDER SUSPENSION  07/2017  . COLONOSCOPY WITH PROPOFOL N/A 07/01/2017   Procedure: COLONOSCOPY WITH PROPOFOL;  Surgeon: Virgel Manifold, MD;  Location: [facility] ENDOSCOPY;  Service: Endoscopy;  Laterality: N/A;  . ESOPHAGOGASTRODUODENOSCOPY (EGD) WITH PROPOFOL N/A 07/01/2017   Procedure: ESOPHAGOGASTRODUODENOSCOPY (EGD) WITH PROPOFOL;  Surgeon: Virgel Manifold, MD;  Location: [facility] ENDOSCOPY;  Service: Endoscopy;  Laterality: N/A;  . LEEP  1995  . LEEP    . PARTIAL HYSTERECTOMY  08/2017   s/p partial hysterectomy - per patient cervix,  uterus, fallopian tubes; ovaries INTACT  . SKIN CANCER EXCISION     melanoma  . TONSILLECTOMY       Current Meds  Medication Sig  . ACCU-CHEK AVIVA PLUS test strip USE UP TO FOUR TIMES DAILY AS DIRECTED  . ACCU-CHEK SOFTCLIX LANCETS lancets TEST BLOOD SUGAR UP TO FOUR TIMES DAILY AS DIRECTED  . ACETAMINOPHEN PO Take by mouth as needed.    Marland Kitchen albuterol (PROVENTIL HFA;VENTOLIN HFA) 108 (90 Base)  MCG/ACT inhaler Inhale 2 puffs into the lungs every 6 (six) hours as needed.  Marland Kitchen albuterol (PROVENTIL) (2.5 MG/3ML) 0.083% nebulizer solution Take 3 mLs (2.5 mg total) by nebulization every 6 (six) hours as needed for wheezing or shortness of breath.  Marland Kitchen aspirin EC 81 MG tablet Take 81 mg by mouth daily.  . B Complex Vitamins (BL VITAMIN B COMPLEX PO) Place 5,000 Film under the tongue daily.  . Blood Glucose Monitoring Suppl (ACCU-CHEK AVIVA PLUS) w/Device KIT USE UP TO FOUR TIMES DAILY AS DIRECTED  . cholecalciferol (VITAMIN D) 400 units TABS tablet Take 1,000 Units by mouth.   . clorazepate (TRANXENE) 7.5 MG tablet Take 7.5 mg by mouth 3 (three) times daily.  . fluticasone (FLONASE) 50 MCG/ACT nasal spray Place 2 sprays into both nostrils daily.  . Fluticasone-Salmeterol (ADVAIR DISKUS) 250-50 MCG/DOSE AEPB Inhale 1 puff into the lungs 2 (two) times daily.  Marland Kitchen loratadine (CLARITIN) 10 MG tablet TAKE ONE (1) TABLET BY MOUTH EVERY DAY  . magnesium hydroxide (MILK OF MAGNESIA) 400 MG/5ML suspension Take by mouth daily as needed for mild constipation.  . Multiple Vitamin (MULTIVITAMIN) tablet Take 1 tablet by mouth daily.  . Omega-3 Fatty Acids (FISH OIL) 1000 MG CAPS Take 1 capsule by mouth daily.  Marland Kitchen omeprazole (PRILOSEC) 20 MG capsule TAKE 1 CAPSULE DAILY.  Marland Kitchen polyethylene glycol powder (MIRALAX) powder Take 1 Container by mouth daily.  . propranolol (INDERAL) 20 MG tablet Take 1 tablet (20 mg total) by mouth 3 (three) times daily.  . psyllium (METAMUCIL) 58.6 % powder Take 1 packet by mouth daily.  . sertraline (ZOLOFT) 100 MG tablet TAKE 1+1/2 TABLETS BY MOUTH DAILY AS DIRECTED  . sertraline (ZOLOFT) 25 MG tablet Take 25 mg by mouth daily.   Marland Kitchen Spacer/Aero Chamber Mouthpiece MISC 1 puff by Does not apply route as needed.     Allergies:   Pork allergy; Pork-derived products; Sulfur; Aciphex [rabeprazole sodium]; Antivert [meclizine hcl]; Buspar [buspirone hcl]; Cat hair extract;  Chlorpheniramine-pseudoeph; Chlorpheniramine-pseudoeph; Chocolate flavor; Codeine; Crestor [rosuvastatin calcium]; Cymbalta [duloxetine hcl]; Duloxetine; Effexor xr [venlafaxine hydrochloride]; Epinephrine; Erythromycin; Escitalopram oxalate; Lamictal [lamotrigine]; Levofloxacin; Lipitor [atorvastatin]; Meclizine; Mometasone furoate; Mometasone furoate; Monosodium glutamate; Neurontin [gabapentin]; Other; Prednisone; Shellfish allergy; Sudafed [pseudoephedrine hcl]; Sulfa antibiotics; Sulfasalazine; Valium; Wellbutrin [bupropion hcl]; Xanax xr [alprazolam]; Alfalfa; Brompheniramine-phenylephrine; Buspirone; and Tape   Social History   Tobacco Use  . Smoking status: Former Smoker    Packs/day: 1.00    Years: 20.00    Pack years: 20.00    Types: Cigarettes    Last attempt to quit: 03/09/2008    Years since quitting: 10.5  . Smokeless tobacco: Never Used  Substance Use Topics  . Alcohol use: No  . Drug use: No     Family Hx: The patient's family history includes Breast cancer (age of onset: 61) in her mother; Cancer in her father, maternal grandmother, and paternal grandfather; Depression in her father and maternal grandfather; Diabetes in her mother; Heart disease in her maternal grandfather; Hyperlipidemia in her brother,  mother, and sister; Hypertension in her brother, mother, and sister; Migraines in her brother and sister.  ROS:   Please see the history of present illness.     All other systems reviewed and are negative.   Prior CV studies:   The following studies were reviewed today:    Labs/Other Tests and Data Reviewed:    EKG:  No ECG reviewed.  Recent Labs: 05/02/2018: TSH 2.31 06/13/2018: ALT 37; Hemoglobin 14.4; Platelets 164 07/01/2018: BUN 22; Creatinine, Ser 0.65; Potassium 4.4; Sodium 137   Recent Lipid Panel Lab Results  Component Value Date/Time   CHOL 233 (H) 05/02/2018 02:57 PM   TRIG 360.0 (H) 05/02/2018 02:57 PM   HDL 29.60 (L) 05/02/2018 02:57 PM    CHOLHDL 8 05/02/2018 02:57 PM   LDLCALC 160 (H) 08/09/2015 04:09 PM   LDLDIRECT 141.0 05/02/2018 02:57 PM    Wt Readings from Last 3 Encounters:  09/09/18 217 lb (98.4 kg)  08/01/18 225 lb 6.4 oz (102.2 kg)  07/25/18 227 lb (103 kg)     Objective:    Vital Signs:  BP 130/84   Pulse 73   Ht _0  (1.575 m)   Wt 217 lb (98.4 kg)   LMP  (LMP Unknown) Comment: urine Preg negative   BMI 39.69 kg/m    VITAL SIGNS:  reviewed GEN:  no acute distress EYES:  sclerae anicteric, EOMI - Extraocular Movements Intact RESPIRATORY:  normal respiratory effort, symmetric expansion SKIN:  no rash, lesions or ulcers. MUSCULOSKELETAL:  no obvious deformities. NEURO:  alert and oriented x 3, no obvious focal deficit PSYCH:  normal affect  ASSESSMENT & PLAN:    1. Mitral valve disease: Previous mitral valve prolapse noted on echocardiogram 2017 but most recent echocardiogram was reassuring.  Continue clinical observation and will have her follow-up in a yearly basis to check for cardiac murmur and need for further echocardiograms. 2. Atypical chest pain: Resolved and was likely due to pulmonary infection.  CTA of the coronary arteries was normal.  COVID-19 Education: The signs and symptoms of COVID-19 were discussed with the patient and how to seek care for testing (follow up with PCP or arrange E-visit).  The importance of social distancing was discussed today.  Time:   Today, I have spent 20 minutes with the patient with telehealth technology discussing the above problems.     Medication Adjustments/Labs and Tests Ordered: Current medicines are reviewed at length with the patient today.  Concerns regarding medicines are outlined above.   Tests Ordered: No orders of the defined types were placed in this encounter.   Medication Changes: No orders of the defined types were placed in this encounter.   Disposition:  Follow up in 1 year(s)  Signed, Kathlyn Sacramento, MD  09/09/2018 2:41 PM     [place]

## 2018-09-12 ENCOUNTER — Encounter: Payer: Self-pay | Admitting: Family

## 2018-09-13 ENCOUNTER — Encounter: Payer: Self-pay | Admitting: Family

## 2018-09-14 ENCOUNTER — Encounter: Payer: Self-pay | Admitting: Family

## 2018-09-14 ENCOUNTER — Ambulatory Visit (INDEPENDENT_AMBULATORY_CARE_PROVIDER_SITE_OTHER): Payer: Medicare HMO | Admitting: Family

## 2018-09-14 DIAGNOSIS — J45909 Unspecified asthma, uncomplicated: Secondary | ICD-10-CM

## 2018-09-14 NOTE — Progress Notes (Addendum)
This visit type was conducted due to national recommendations for restrictions regarding the COVID-19 pandemic (e.g. social distancing).  This format is felt to be most appropriate for this patient at this time.  All issues noted in this document were discussed and addressed.  No physical exam was performed (except for noted visual exam findings with Video Visits). Virtual Visit via Video Note  I connected with@  on 09/14/18 at  1:30 PM EDT by a video enabled telemedicine application and verified that I am speaking with the correct person using two identifiers.  Location patient: home Location provider:work  Persons participating in the virtual visit: patient, provider  I discussed the limitations of evaluation and management by telemedicine and the availability of in person appointments. The patient expressed understanding and agreed to proceed.   HPI:  Feeling better.   Wheezing is 'light'.  Occasional productive cough. Describes congestion as thick and green, more in the mornings such as in the shower. Steam breaks it up. Will go periods of the day without cough and then having coughing spell , 'feels random'. Not a/w activity.  No CP, fever.    Using flonase with some relief.   Nebulizer as tolerated due to anxiety. Helps with wheezing. Advair has been helping.   CXR - no edema 08/25/2018.   Would like another allergist referral.  Follows with Dr Ashby Dawes.   Dr Fletcher Anon 09/09/18- mitral valve disease; f/u annually to check murmur   ROS: See pertinent positives and negatives per HPI.  Past Medical History:  Diagnosis Date  . Abnormal Pap smear of cervix    h/o LEEP  . Allergy   . Angio-edema   . Anxiety   . Asthma   . Cancer ([place])    melanoma 2003  . Constipation   . Depression   . Diarrhea   . Hypertension   . Low back pain   . Melanoma ([place])    right foot s/p lymph node removed right groin  . Obesity (BMI 30-39.9)   . Urticaria   . Vaginal delivery    X 2     Past Surgical History:  Procedure Laterality Date  . BLADDER SUSPENSION  07/2017  . COLONOSCOPY WITH PROPOFOL N/A 07/01/2017   Procedure: COLONOSCOPY WITH PROPOFOL;  Surgeon: Virgel Manifold, MD;  Location: [facility] ENDOSCOPY;  Service: Endoscopy;  Laterality: N/A;  . ESOPHAGOGASTRODUODENOSCOPY (EGD) WITH PROPOFOL N/A 07/01/2017   Procedure: ESOPHAGOGASTRODUODENOSCOPY (EGD) WITH PROPOFOL;  Surgeon: Virgel Manifold, MD;  Location: [facility] ENDOSCOPY;  Service: Endoscopy;  Laterality: N/A;  . LEEP  1995  . LEEP    . PARTIAL HYSTERECTOMY  08/2017   s/p partial hysterectomy - per patient cervix, uterus, fallopian tubes; ovaries INTACT  . SKIN CANCER EXCISION     melanoma  . TONSILLECTOMY      Family History  Problem Relation Age of Onset  . Hyperlipidemia Mother   . Hypertension Mother   . Diabetes Mother   . Breast cancer Mother 20  . Cancer Father        lung  . Depression Father   . Hyperlipidemia Sister   . Hypertension Sister   . Migraines Sister   . Hypertension Brother   . Hyperlipidemia Brother   . Migraines Brother   . Cancer Maternal Grandmother        brain  . Depression Maternal Grandfather   . Heart disease Maternal Grandfather   . Cancer Paternal Grandfather        unknown  SOCIAL HX: former smoker.    Current Outpatient Medications:  .  ACCU-CHEK AVIVA PLUS test strip, USE UP TO FOUR TIMES DAILY AS DIRECTED, Disp: 100 each, Rfl: 11 .  ACCU-CHEK SOFTCLIX LANCETS lancets, TEST BLOOD SUGAR UP TO FOUR TIMES DAILY AS DIRECTED, Disp: 400 each, Rfl: 0 .  ACETAMINOPHEN PO, Take by mouth as needed.  , Disp: , Rfl:  .  albuterol (PROVENTIL HFA;VENTOLIN HFA) 108 (90 Base) MCG/ACT inhaler, Inhale 2 puffs into the lungs every 6 (six) hours as needed., Disp: 1 Inhaler, Rfl: 1 .  albuterol (PROVENTIL) (2.5 MG/3ML) 0.083% nebulizer solution, Take 3 mLs (2.5 mg total) by nebulization every 6 (six) hours as needed for wheezing or shortness of breath., Disp: 30 mL, Rfl:  3 .  aspirin EC 81 MG tablet, Take 81 mg by mouth daily., Disp: , Rfl:  .  B Complex Vitamins (BL VITAMIN B COMPLEX PO), Place 5,000 Film under the tongue daily., Disp: , Rfl:  .  Blood Glucose Monitoring Suppl (ACCU-CHEK AVIVA PLUS) w/Device KIT, USE UP TO FOUR TIMES DAILY AS DIRECTED, Disp: 1 kit, Rfl: 0 .  cholecalciferol (VITAMIN D) 400 units TABS tablet, Take 1,000 Units by mouth. , Disp: , Rfl:  .  clorazepate (TRANXENE) 7.5 MG tablet, Take 7.5 mg by mouth 3 (three) times daily., Disp: , Rfl:  .  fluticasone (FLONASE) 50 MCG/ACT nasal spray, Place 2 sprays into both nostrils daily., Disp: 1 g, Rfl: 5 .  Fluticasone-Salmeterol (ADVAIR DISKUS) 250-50 MCG/DOSE AEPB, Inhale 1 puff into the lungs 2 (two) times daily., Disp: 1 each, Rfl: 5 .  loratadine (CLARITIN) 10 MG tablet, TAKE ONE (1) TABLET BY MOUTH EVERY DAY, Disp: 90 tablet, Rfl: 3 .  magnesium hydroxide (MILK OF MAGNESIA) 400 MG/5ML suspension, Take by mouth daily as needed for mild constipation., Disp: , Rfl:  .  Multiple Vitamin (MULTIVITAMIN) tablet, Take 1 tablet by mouth daily., Disp: , Rfl:  .  Omega-3 Fatty Acids (FISH OIL) 1000 MG CAPS, Take 1 capsule by mouth daily., Disp: , Rfl:  .  omeprazole (PRILOSEC) 20 MG capsule, TAKE 1 CAPSULE DAILY., Disp: 90 capsule, Rfl: 0 .  polyethylene glycol powder (MIRALAX) powder, Take 1 Container by mouth daily., Disp: , Rfl:  .  propranolol (INDERAL) 20 MG tablet, Take 1 tablet (20 mg total) by mouth 3 (three) times daily., Disp: 90 tablet, Rfl: 3 .  psyllium (METAMUCIL) 58.6 % powder, Take 1 packet by mouth daily., Disp: , Rfl:  .  sertraline (ZOLOFT) 100 MG tablet, TAKE 1+1/2 TABLETS BY MOUTH DAILY AS DIRECTED, Disp: 45 tablet, Rfl: 3 .  sertraline (ZOLOFT) 25 MG tablet, Take 25 mg by mouth daily. , Disp: , Rfl:  .  Spacer/Aero Chamber Mouthpiece MISC, 1 puff by Does not apply route as needed., Disp: 1 each, Rfl: 1  EXAM:  VITALS per patient if applicable:  GENERAL: alert, oriented,  appears well and in no acute distress  HEENT: atraumatic, conjunttiva clear, no obvious abnormalities on inspection of external nose and ears  NECK: normal movements of the head and neck  LUNGS: on inspection no signs of respiratory distress, breathing rate appears normal, no obvious gross SOB, gasping or wheezing  CV: no obvious cyanosis  MS: moves all visible extremities without noticeable abnormality  PSYCH/NEURO: pleasant and cooperative, no obvious depression or anxiety, speech and thought processing grossly intact  ASSESSMENT AND PLAN:  Second March a potassium of 3.2 but that was around the time she was in the  hospital her hip replaced they probably replaced recheck after you stop none of so she is getting her ultrasound at 145 0 I would try to call her like radiologist here if her potassium discussed the following assessment and plan:  Uncomplicated asthma, unspecified asthma severity, unspecified whether persistent  Problem List Items Addressed This Visit      Respiratory   Asthma    No acute respiratory distress.  She is not labored in her speech.  Pleased with improvement however cough persists. At this time, we agreed pursuit of allergy referral most important as suspect this is largely driving symptoms versus bacterial infection. Patient will stay vigilant and let me know how she is doing and of course if any of her symptoms were to change.            I discussed the assessment and treatment plan with the patient. The patient was provided an opportunity to ask questions and all were answered. The patient agreed with the plan and demonstrated an understanding of the instructions.   The patient was advised to call back or seek an in-person evaluation if the symptoms worsen or if the condition fails to improve as anticipated.   Mable Paris, FNP

## 2018-09-14 NOTE — Patient Instructions (Addendum)
Let's pursue allergy referral.  Continue regimen for now and let me know if you have any new or worsening symptoms.  Continue to follow with pulmonology.   Stay safe!   Asthma Attack Prevention, Adult Although you may not be able to control the fact that you have asthma, you can take actions to prevent episodes of asthma (asthma attacks). These actions include:  Creating a written plan for managing and treating your asthma attacks (asthma action plan).  Monitoring your asthma.  Avoiding things that can irritate your airways or make your asthma symptoms worse (asthma triggers).  Taking your medicines as directed.  Acting quickly if you have signs or symptoms of an asthma attack. What are some ways to prevent an asthma attack? Create a plan Work with your health care provider to create an asthma action plan. This plan should include:  A list of your asthma triggers and how to avoid them.  A list of symptoms that you experience during an asthma attack.  Information about when to take medicine and how much medicine to take.  Information to help you understand your peak flow measurements.  Contact information for your health care providers.  Daily actions that you can take to control asthma. Monitor your asthma To monitor your asthma:  Use your peak flow meter every morning and every evening for 2-3 weeks. Record the results in a journal. A drop in your peak flow numbers on one or more days may mean that you are starting to have an asthma attack, even if you are not having symptoms.  When you have asthma symptoms, write them down in a journal.  Avoid asthma triggers Work with your health care provider to find out what your asthma triggers are. This can be done by:  Being tested for allergies.  Keeping a journal that notes when asthma attacks occur and what may have contributed to them.  Asking your health care provider whether other medical conditions make your asthma  worse. Common asthma triggers include:  Dust.  Smoke. This includes campfire smoke and secondhand smoke from tobacco products.  Pet dander.  Trees, grasses or pollens.  Very cold, dry, or humid air.  Mold.  Foods that contain high amounts of sulfites.  Strong smells.  Engine exhaust and air pollution.  Aerosol sprays and fumes from household cleaners.  Household pests and their droppings, including dust mites and cockroaches.  Certain medicines, including NSAIDs. Once you have determined your asthma triggers, take steps to avoid them. Depending on your triggers, you may be able to reduce the chance of an asthma attack by:  Keeping your home clean. Have someone dust and vacuum your home for you 1 or 2 times a week. If possible, have them use a high-efficiency particulate arrestance (HEPA) vacuum.  Washing your sheets weekly in hot water.  Using allergy-proof mattress covers and casings on your bed.  Keeping pets out of your home.  Taking care of mold and water problems in your home.  Avoiding areas where people smoke.  Avoiding using strong perfumes or odor sprays.  Avoid spending a lot of time outdoors when pollen counts are high and on very windy days.  Talking with your health care provider before stopping or starting any new medicines. Medicines Take over-the-counter and prescription medicines only as told by your health care provider. Many asthma attacks can be prevented by carefully following your medicine schedule. Taking your medicines correctly is especially important when you cannot avoid certain asthma triggers. Even  if you are doing well, do not stop taking your medicine and do not take less medicine. Act quickly If an asthma attack happens, acting quickly can decrease how severe it is and how long it lasts. Take these actions:  Pay attention to your symptoms. If you are coughing, wheezing, or having difficulty breathing, do not wait to see if your  symptoms go away on their own. Follow your asthma action plan.  If you have followed your asthma action plan and your symptoms are not improving, call your health care provider or seek immediate medical care at the nearest hospital. It is important to write down how often you need to use your fast-acting rescue inhaler. You can track how often you use an inhaler in your journal. If you are using your rescue inhaler more often, it may mean that your asthma is not under control. Adjusting your asthma treatment plan may help you to prevent future asthma attacks and help you to gain better control of your condition. How can I prevent an asthma attack when I exercise? Exercise is a common asthma trigger. To prevent asthma attacks during exercise:  Follow advice from your health care provider about whether you should use your fast-acting inhaler before exercising. Many people with asthma experience exercise-induced bronchoconstriction (EIB). This condition often worsens during vigorous exercise in cold, humid, or dry environments. Usually, people with EIB can stay very active by using a fast-acting inhaler before exercising.  Avoid exercising outdoors in very cold or humid weather.  Avoid exercising outdoors when pollen counts are high.  Warm up and cool down when exercising.  Stop exercising right away if asthma symptoms start. Consider taking part in exercises that are less likely to cause asthma symptoms such as:  Indoor swimming.  Biking.  Walking.  Hiking.  Playing football. This information is not intended to replace advice given to you by your health care provider. Make sure you discuss any questions you have with your health care provider. Document Released: 04/22/2009 Document Revised: 01/03/2016 Document Reviewed: 10/19/2015 Elsevier Interactive Patient Education  2019 [employer].

## 2018-09-16 DIAGNOSIS — J45909 Unspecified asthma, uncomplicated: Secondary | ICD-10-CM | POA: Insufficient documentation

## 2018-09-16 NOTE — Assessment & Plan Note (Addendum)
No acute respiratory distress.  She is not labored in her speech.  Pleased with improvement however cough persists. At this time, we agreed pursuit of allergy referral most important as suspect this is largely driving symptoms versus bacterial infection. Patient will stay vigilant and let me know how she is doing and of course if any of her symptoms were to change.

## 2018-10-06 ENCOUNTER — Other Ambulatory Visit: Payer: Self-pay | Admitting: Family

## 2018-10-06 NOTE — Telephone Encounter (Signed)
Per chart has not been filled since 2017 ok yo fill?

## 2018-10-06 NOTE — Telephone Encounter (Signed)
Copied from [place] [id number]. Topic: Quick Communication - Rx Refill/Question >> Oct 06, 2018  9:29 AM Erick Blinks wrote: Medication: omeprazole (PRILOSEC) 20 MG capsule [[id number]] Lavell Anchors from [facility] called to inquire about recent fax request sent to refill this medication on 09/30/2018.   Has the patient contacted their pharmacy? No (Agent: If no, request that the patient contact the pharmacy for the refill.) (Agent: If yes, when and what did the pharmacy advise?)  Preferred Pharmacy (with phone number or street name): [address] [place] [address]    Agent: Please be advised that RX refills may take up to 3 business days. We ask that you follow-up with your pharmacy.

## 2018-10-07 MED ORDER — OMEPRAZOLE 20 MG PO CPDR: 20.0000 mg | DELAYED_RELEASE_CAPSULE | Freq: Every day | ORAL | 0 refills | Status: DC

## 2018-10-07 NOTE — Telephone Encounter (Signed)
Call pt Refilled prilosec  Please emphasize that she need to f/u with GI.  Does she know when she will go back for repeat EGD? Her colonoscopy is due 2024.      Upper GI dr Bonna Gains 06/2017

## 2018-10-12 ENCOUNTER — Other Ambulatory Visit: Payer: Self-pay

## 2018-10-12 MED ORDER — OMEPRAZOLE 20 MG PO CPDR: 20.0000 mg | DELAYED_RELEASE_CAPSULE | Freq: Every day | ORAL | 0 refills | Status: DC

## 2018-10-17 ENCOUNTER — Telehealth: Payer: Self-pay | Admitting: Family

## 2018-10-17 DIAGNOSIS — F419 Anxiety disorder, unspecified: Secondary | ICD-10-CM

## 2018-10-17 NOTE — Telephone Encounter (Signed)
LM that referral was placed. I advised to keep current appointment just incase. If she did not hear about referral within the next week to please call our office.

## 2018-10-17 NOTE — Telephone Encounter (Unsigned)
Copied from [place] [id number]. Topic: General - Inquiry >> Oct 17, 2018  1:52 PM Mathis Bud wrote: Reason for CRM: Patient is requesting PCP or nurse to send referral for [facility]. Patient is having issues with insurance not paying for her neuropsychiatry.   Fax: [phone] Phone: [address], [place], [place] [address]

## 2018-10-17 NOTE — Telephone Encounter (Signed)
Call pt Ref placed Advise her to keep her current psychiatrist  Until she has an appt with new one.   Let us know if she doesn't get a call within week for appt .

## 2018-11-15 ENCOUNTER — Ambulatory Visit: Payer: Medicare HMO | Admitting: Internal Medicine

## 2018-11-17 DIAGNOSIS — H524 Presbyopia: Secondary | ICD-10-CM | POA: Diagnosis not present

## 2018-11-17 DIAGNOSIS — H52223 Regular astigmatism, bilateral: Secondary | ICD-10-CM | POA: Diagnosis not present

## 2018-12-03 ENCOUNTER — Other Ambulatory Visit: Payer: Self-pay | Admitting: Internal Medicine

## 2018-12-06 ENCOUNTER — Telehealth: Payer: Self-pay | Admitting: Internal Medicine

## 2018-12-06 NOTE — Telephone Encounter (Signed)
Called pt to change appt. On 12/27/2018 to phone/virtual visit. Provider will be virtual only on this day and time.

## 2018-12-07 ENCOUNTER — Other Ambulatory Visit: Payer: Self-pay | Admitting: Family

## 2018-12-13 DIAGNOSIS — F410 Panic disorder [episodic paroxysmal anxiety] without agoraphobia: Secondary | ICD-10-CM | POA: Diagnosis not present

## 2018-12-13 DIAGNOSIS — F39 Unspecified mood [affective] disorder: Secondary | ICD-10-CM | POA: Diagnosis not present

## 2018-12-13 DIAGNOSIS — F4001 Agoraphobia with panic disorder: Secondary | ICD-10-CM | POA: Diagnosis not present

## 2018-12-15 NOTE — Telephone Encounter (Signed)
Called x2- no success

## 2018-12-19 NOTE — Telephone Encounter (Signed)
Nothing further needed 

## 2018-12-27 ENCOUNTER — Ambulatory Visit (INDEPENDENT_AMBULATORY_CARE_PROVIDER_SITE_OTHER): Payer: Medicare HMO | Admitting: Internal Medicine

## 2018-12-27 ENCOUNTER — Other Ambulatory Visit: Payer: Self-pay

## 2018-12-27 ENCOUNTER — Ambulatory Visit: Payer: Medicare HMO | Admitting: Internal Medicine

## 2018-12-27 DIAGNOSIS — J4551 Severe persistent asthma with (acute) exacerbation: Secondary | ICD-10-CM

## 2018-12-27 DIAGNOSIS — J209 Acute bronchitis, unspecified: Secondary | ICD-10-CM | POA: Diagnosis not present

## 2018-12-27 MED ORDER — ALBUTEROL SULFATE HFA 108 (90 BASE) MCG/ACT IN AERS: 1.0000 | INHALATION_SPRAY | Freq: Four times a day (QID) | RESPIRATORY_TRACT | 3 refills | Status: DC | PRN

## 2018-12-27 MED ORDER — ALBUTEROL SULFATE HFA 108 (90 BASE) MCG/ACT IN AERS: 2.0000 | INHALATION_SPRAY | Freq: Four times a day (QID) | RESPIRATORY_TRACT | 3 refills | Status: DC | PRN

## 2018-12-27 NOTE — Progress Notes (Signed)
[facility] Consultation     Virtual Visit via Telephone Note I connected with patient on 12/27/18 at  3:15 PM EDT by telephone and verified that I am speaking with the correct person using two identifiers.   I discussed the limitations, risks of performing an evaluation and management service by telephone and the availability of in person appointments. I also discussed with the patient that there may be a patient responsible charge related to this service.  In light of current covid-19 pandemic, patient also understands that we are trying to protect them by minimizing in office contact if at all possible.  The patient expressed understanding and agreed to proceed. Please see note below for further detail.    The patient was advised to call back or seek an in-person evaluation if the symptoms worsen or if the condition fails to improve as anticipated. I provided 20 minutes of non face-to-face time during this encounter.    Laverle Hobby, MD   Assessment and Plan:  Severe persistent asthma with chronic bronchitis.  Severe anxiety, with multiple medication allergies complicating treatment - Continue advair. Use albuterol as needed.  - Patient has a history of severe asthma with 2 weeks hospitalization of the past, and is at high risk for exacerbations, further flareup of disease. -Outside allergy results reviewed, below.  --Avoid excess beta agonists, singulair if possible.   Return in about 6 months (around 06/29/2019).   Date: 12/27/2018  MRN# [id number] Brittney Ruiz 1965-05-24    Brittney Ruiz is a 53 y.o. old female seen in consultation for chief complaint of: asthma.      HPI:  Brittney Ruiz is a 53 y.o. female with asthma with history of recent exacerbation. At last visit she was started on advair 250.  She is using advair bid and rinses mouth. She notes some hoarseness of voice. She feels that her breathing is doing better. She uses albuterol neb  about once per week, in contrast to at least once or twice per day.   **CT cardiac 07/13/2018>>lung views are unremarkable.  **Chest x-ray 06/13/2018>> changes of chronic bronchitis, suggestive of COPD. **CBC 05/02/2018>> absolute eosinophil count 400. **PSG 01/10/18>>negative.  **HST 12/13/17>> negative.  **Allergy prick testing 10/12/2017>> 3+ allergy to cat.  Otherwise all other inhalants, grasses, weeds, trees, molds negative.  Histamine control positive at 2+. **[facility] allergy testing 04/10/2013>> mildly positive for cat, dog, otherwise negative for all other tested items.  IgE 19.  Medication:    Current Outpatient Medications:    ACCU-CHEK AVIVA PLUS test strip, USE UP TO FOUR TIMES DAILY AS DIRECTED, Disp: 100 each, Rfl: 11   ACCU-CHEK SOFTCLIX LANCETS lancets, TEST BLOOD SUGAR UP TO FOUR TIMES DAILY AS DIRECTED, Disp: 400 each, Rfl: 0   ACETAMINOPHEN PO, Take by mouth as needed.  , Disp: , Rfl:    albuterol (PROVENTIL HFA;VENTOLIN HFA) 108 (90 Base) MCG/ACT inhaler, Inhale 2 puffs into the lungs every 6 (six) hours as needed., Disp: 1 Inhaler, Rfl: 1   albuterol (PROVENTIL) (2.5 MG/3ML) 0.083% nebulizer solution, Take 3 mLs (2.5 mg total) by nebulization every 6 (six) hours as needed for wheezing or shortness of breath., Disp: 30 mL, Rfl: 3   aspirin EC 81 MG tablet, Take 81 mg by mouth daily., Disp: , Rfl:    B Complex Vitamins (BL VITAMIN B COMPLEX PO), Place 5,000 Film under the tongue daily., Disp: , Rfl:    Blood Glucose Monitoring Suppl (ACCU-CHEK AVIVA PLUS) w/Device KIT,  USE UP TO FOUR TIMES DAILY AS DIRECTED, Disp: 1 kit, Rfl: 0   cholecalciferol (VITAMIN D) 400 units TABS tablet, Take 1,000 Units by mouth. , Disp: , Rfl:    clorazepate (TRANXENE) 7.5 MG tablet, Take 7.5 mg by mouth 3 (three) times daily., Disp: , Rfl:    fluticasone (FLONASE) 50 MCG/ACT nasal spray, USE 2 SPRAYS INTO BOTH NOSTRILS DAILY., Disp: 48 g, Rfl: 2   Fluticasone-Salmeterol (WIXELA INHUB)  250-50 MCG/DOSE AEPB, Inhale 1 puff into the lungs 2 (two) times daily., Disp: 180 each, Rfl: 3   loratadine (CLARITIN) 10 MG tablet, TAKE ONE (1) TABLET BY MOUTH EVERY DAY, Disp: 90 tablet, Rfl: 3   magnesium hydroxide (MILK OF MAGNESIA) 400 MG/5ML suspension, Take by mouth daily as needed for mild constipation., Disp: , Rfl:    Multiple Vitamin (MULTIVITAMIN) tablet, Take 1 tablet by mouth daily., Disp: , Rfl:    Omega-3 Fatty Acids (FISH OIL) 1000 MG CAPS, Take 1 capsule by mouth daily., Disp: , Rfl:    omeprazole (PRILOSEC) 20 MG capsule, Take 1 capsule (20 mg total) by mouth daily., Disp: 90 capsule, Rfl: 0   polyethylene glycol powder (MIRALAX) powder, Take 1 Container by mouth daily., Disp: , Rfl:    propranolol (INDERAL) 20 MG tablet, Take 1 tablet (20 mg total) by mouth 3 (three) times daily., Disp: 90 tablet, Rfl: 3   psyllium (METAMUCIL) 58.6 % powder, Take 1 packet by mouth daily., Disp: , Rfl:    sertraline (ZOLOFT) 100 MG tablet, TAKE 1+1/2 TABLETS BY MOUTH DAILY AS DIRECTED, Disp: 45 tablet, Rfl: 3   sertraline (ZOLOFT) 25 MG tablet, Take 25 mg by mouth daily. , Disp: , Rfl:    Spacer/Aero Chamber Mouthpiece MISC, 1 puff by Does not apply route as needed., Disp: 1 each, Rfl: 1   Allergies:  Pork allergy, Pork-derived products, Sulfur, Aciphex [rabeprazole sodium], Antivert [meclizine hcl], Buspar [buspirone hcl], Cat hair extract, Chlorpheniramine-pseudoeph, Chlorpheniramine-pseudoeph, Chocolate flavor, Codeine, Crestor [rosuvastatin calcium], Cymbalta [duloxetine hcl], Duloxetine, Effexor xr [venlafaxine hydrochloride], Epinephrine, Erythromycin, Escitalopram oxalate, Lamictal [lamotrigine], Levofloxacin, Lipitor [atorvastatin], Meclizine, Mometasone furoate, Mometasone furoate, Monosodium glutamate, Neurontin [gabapentin], Other, Prednisone, Shellfish allergy, Sudafed [pseudoephedrine hcl], Sulfa antibiotics, Sulfasalazine, Valium, Wellbutrin [bupropion hcl], Xanax xr  [alprazolam], Alfalfa, Brompheniramine-phenylephrine, Buspirone, and Tape  Review of Systems:  Constitutional: Feels well. Cardiovascular: Denies chest pain, exertional chest pain.  Pulmonary: Denies hemoptysis, pleuritic chest pain.   The remainder of systems were reviewed and were found to be negative other than what is documented in the HPI.       LABORATORY PANEL:   CBC No results for input(s): WBC, HGB, HCT, PLT in the last 168 hours. ------------------------------------------------------------------------------------------------------------------  Chemistries  No results for input(s): NA, K, CL, CO2, GLUCOSE, BUN, CREATININE, CALCIUM, MG, AST, ALT, ALKPHOS, BILITOT in the last 168 hours.  Invalid input(s): GFRCGP ------------------------------------------------------------------------------------------------------------------  Cardiac Enzymes No results for input(s): TROPONINI in the last 168 hours. ------------------------------------------------------------  RADIOLOGY:  No results found.     Thank  you for the consultation and for allowing [facility] to assist in the care of your patient. Our recommendations are noted above.  Please contact us if we can be of further service.   Marda Stalker, M.D., F.C.C.P.  Board Certified in Internal Medicine, Pulmonary Medicine, [place], and Sleep Medicine.  [facility] Office Number: [phone]   12/27/2018

## 2018-12-27 NOTE — Patient Instructions (Signed)
Continue advair, use albuterol as needed.

## 2019-01-26 DIAGNOSIS — J329 Chronic sinusitis, unspecified: Secondary | ICD-10-CM | POA: Diagnosis not present

## 2019-01-26 DIAGNOSIS — J301 Allergic rhinitis due to pollen: Secondary | ICD-10-CM | POA: Diagnosis not present

## 2019-01-26 DIAGNOSIS — J3081 Allergic rhinitis due to animal (cat) (dog) hair and dander: Secondary | ICD-10-CM | POA: Diagnosis not present

## 2019-01-26 DIAGNOSIS — J309 Allergic rhinitis, unspecified: Secondary | ICD-10-CM | POA: Diagnosis not present

## 2019-01-31 ENCOUNTER — Telehealth: Payer: Self-pay | Admitting: *Deleted

## 2019-01-31 NOTE — Telephone Encounter (Signed)
Copied from [place] [id number]. Topic: Quick Communication - See Telephone Encounter >> Jan 31, 2019  4:36 PM Brittney Ruiz wrote: CRM for notification. See Telephone encounter for: 01/31/19.Pls FU with pt with the allergy specialist and it seems that the new allergist is stating that she has no food allergies. She does not know what to do. Previous Allergist Dr Jerrol Banana had stated that she does have the allergies. She needs advice in where she is to go from there. Pls give her a call. [phone]

## 2019-02-01 NOTE — Telephone Encounter (Signed)
LMTCB to schedule virtual appointment,

## 2019-02-01 NOTE — Telephone Encounter (Signed)
Call pt  Brittney Ruiz she was finally able to be seen by [facility].   I am not sure what questions she may have.   I am looking through chart and dont see the office visit notes from her visit with [facility] scanned in yet so I cannot read about the visit yet.  Would patient like to make a virtual appointment with [name] today?

## 2019-02-03 ENCOUNTER — Other Ambulatory Visit: Payer: Self-pay

## 2019-02-03 MED ORDER — ACCU-CHEK AVIVA PLUS VI STRP: ORAL_STRIP | 11 refills | Status: DC

## 2019-02-06 DIAGNOSIS — J329 Chronic sinusitis, unspecified: Secondary | ICD-10-CM | POA: Diagnosis not present

## 2019-02-09 ENCOUNTER — Encounter: Payer: Self-pay | Admitting: Family

## 2019-02-09 ENCOUNTER — Telehealth: Payer: Self-pay

## 2019-02-09 NOTE — Telephone Encounter (Signed)
I called & was unable to reach patient per Dr. Ellen Henri request asking patient to please proceed to ED based on symptoms. I will continue to try to reach patient & LMTCB.

## 2019-02-09 NOTE — Telephone Encounter (Signed)
Dk please advise. Thanks

## 2019-02-09 NOTE — Telephone Encounter (Signed)
She really should be seen in person in the ED. This is going to be a very difficult thing to evaluate with a virtual visit and with her symptoms she would not pass the COVID19 screening to be seen in office. If she refuses the ED I am happy to do the virtual visit though I may still recommend an ED visit after evaluating her.

## 2019-02-09 NOTE — Telephone Encounter (Signed)
I called patient to try to get her scheduled for a visit after reading mychart message. I asked that patient call back so that we can get possibly schedule her with Lauren.

## 2019-02-10 ENCOUNTER — Ambulatory Visit
Admission: RE | Admit: 2019-02-10 | Discharge: 2019-02-10 | Disposition: A | Discharge status: Home / Self Care | Payer: Medicare HMO | Source: Ambulatory Visit | Attending: Family Medicine | Admitting: Family Medicine

## 2019-02-10 ENCOUNTER — Other Ambulatory Visit: Payer: Self-pay

## 2019-02-10 ENCOUNTER — Encounter: Payer: Self-pay | Admitting: Family Medicine

## 2019-02-10 ENCOUNTER — Ambulatory Visit (INDEPENDENT_AMBULATORY_CARE_PROVIDER_SITE_OTHER): Payer: Medicare HMO | Admitting: Family Medicine

## 2019-02-10 VITALS — Ht 62.0 in | Wt 210.0 lb

## 2019-02-10 DIAGNOSIS — R202 Paresthesia of skin: Secondary | ICD-10-CM | POA: Diagnosis not present

## 2019-02-10 DIAGNOSIS — R410 Disorientation, unspecified: Secondary | ICD-10-CM | POA: Diagnosis not present

## 2019-02-10 DIAGNOSIS — R200 Anesthesia of skin: Secondary | ICD-10-CM | POA: Diagnosis not present

## 2019-02-10 DIAGNOSIS — R42 Dizziness and giddiness: Secondary | ICD-10-CM | POA: Diagnosis not present

## 2019-02-10 NOTE — Progress Notes (Signed)
Virtual Visit via video Note  This visit type was conducted due to national recommendations for restrictions regarding the COVID-19 pandemic (e.g. social distancing).  This format is felt to be most appropriate for this patient at this time.  All issues noted in this document were discussed and addressed.  No physical exam was performed (except for noted visual exam findings with Video Visits).   I connected with Brittney Ruiz today at  9:00 AM EDT by a video enabled telemedicine application and verified that I am speaking with the correct person using two identifiers. Location patient: home Location provider: work Persons participating in the virtual visit: patient, provider  I discussed the limitations, risks, security and privacy concerns of performing an evaluation and management service by telephone and the availability of in person appointments. I also discussed with the patient that there may be a patient responsible charge related to this service. The patient expressed understanding and agreed to proceed.  Reason for visit: same day visit  HPI: Numbness/speech changes/headache: Patient reports 4 months ago she started on wixela.  She subsequently developed hoarseness and shortness of breath that has been going on over the last 4 months.  She lost her voice at one point.  She then started to have headaches daily and decreased vision.  Over the last week she notes that she would start to say words backwards and say words that would not make any sense.  She would be dizzy when just sitting there and was unable to walk well related to the dizziness.  She notes 3 days ago she had numbness in her left arm and left leg that lasted for about 10 minutes.  That has not recurred.  She has chronic back pain and she thought that was related to a pinched nerve.  She notes no cough or fevers.  No wheezing.  No chest pain.  She does note some memory issues and difficulty concentrating as well over the last  week or so.  She reports a long history of issues with steroids causing side effects and she feels the symptoms are likely related to the wixela.  She does note it crossed her mind that it could be related to a neurological issue.  She was advised to go to the emergency room yesterday by CMA in our office and through my chart message from me and the pulmonologist who she sent a MyChart message to as well.  She stopped the wixela 2 days ago and notes that her symptoms have improved slightly where she does not have a headache today and her dizziness has improved as well as the confusion she was having.   ROS: See pertinent positives and negatives per HPI.  Past Medical History:  Diagnosis Date   Abnormal Pap smear of cervix    h/o LEEP   Allergy    Angio-edema    Anxiety    Asthma    Cancer ([place])    melanoma 2003   Constipation    Depression    Diarrhea    Hypertension    Low back pain    Melanoma ([place])    right foot s/p lymph node removed right groin   Obesity (BMI 30-39.9)    Urticaria    Vaginal delivery    X 2    Past Surgical History:  Procedure Laterality Date   BLADDER SUSPENSION  07/2017   COLONOSCOPY WITH PROPOFOL N/A 07/01/2017   Procedure: COLONOSCOPY WITH PROPOFOL;  Surgeon: Virgel Manifold, MD;  Location: [facility] ENDOSCOPY;  Service: Endoscopy;  Laterality: N/A;   ESOPHAGOGASTRODUODENOSCOPY (EGD) WITH PROPOFOL N/A 07/01/2017   Procedure: ESOPHAGOGASTRODUODENOSCOPY (EGD) WITH PROPOFOL;  Surgeon: Virgel Manifold, MD;  Location: [facility] ENDOSCOPY;  Service: Endoscopy;  Laterality: N/A;   LEEP  1995   LEEP     PARTIAL HYSTERECTOMY  08/2017   s/p partial hysterectomy - per patient cervix, uterus, fallopian tubes; ovaries INTACT   SKIN CANCER EXCISION     melanoma   TONSILLECTOMY      Family History  Problem Relation Age of Onset   Hyperlipidemia Mother    Hypertension Mother    Diabetes Mother    Breast cancer Mother 70   Cancer  Father        lung   Depression Father    Hyperlipidemia Sister    Hypertension Sister    Migraines Sister    Hypertension Brother    Hyperlipidemia Brother    Migraines Brother    Cancer Maternal Grandmother        brain   Depression Maternal Grandfather    Heart disease Maternal Grandfather    Cancer Paternal Grandfather        unknown    SOCIAL HX: former smoker   Current Outpatient Medications:    ACCU-CHEK SOFTCLIX LANCETS lancets, TEST BLOOD SUGAR UP TO FOUR TIMES DAILY AS DIRECTED, Disp: 400 each, Rfl: 0   ACETAMINOPHEN PO, Take by mouth as needed.  , Disp: , Rfl:    albuterol (PROVENTIL HFA;VENTOLIN HFA) 108 (90 Base) MCG/ACT inhaler, Inhale 2 puffs into the lungs every 6 (six) hours as needed., Disp: 1 Inhaler, Rfl: 1   albuterol (PROVENTIL) (2.5 MG/3ML) 0.083% nebulizer solution, Take 3 mLs (2.5 mg total) by nebulization every 6 (six) hours as needed for wheezing or shortness of breath., Disp: 30 mL, Rfl: 3   albuterol (VENTOLIN HFA) 108 (90 Base) MCG/ACT inhaler, Inhale 2 puffs into the lungs every 6 (six) hours as needed for wheezing or shortness of breath., Disp: 1 g, Rfl: 3   aspirin EC 81 MG tablet, Take 81 mg by mouth daily., Disp: , Rfl:    B Complex Vitamins (BL VITAMIN B COMPLEX PO), Place 5,000 Film under the tongue daily., Disp: , Rfl:    Blood Glucose Monitoring Suppl (ACCU-CHEK AVIVA PLUS) w/Device KIT, USE UP TO FOUR TIMES DAILY AS DIRECTED, Disp: 1 kit, Rfl: 0   cholecalciferol (VITAMIN D) 400 units TABS tablet, Take 1,000 Units by mouth. , Disp: , Rfl:    clorazepate (TRANXENE) 7.5 MG tablet, Take 7.5 mg by mouth 3 (three) times daily., Disp: , Rfl:    fluticasone (FLONASE) 50 MCG/ACT nasal spray, USE 2 SPRAYS INTO BOTH NOSTRILS DAILY., Disp: 48 g, Rfl: 2   glucose blood (ACCU-CHEK AVIVA PLUS) test strip, USE UP TO FOUR TIMES DAILY AS DIRECTED, Disp: 100 each, Rfl: 11   loratadine (CLARITIN) 10 MG tablet, TAKE ONE (1) TABLET BY MOUTH  EVERY DAY, Disp: 90 tablet, Rfl: 3   Multiple Vitamin (MULTIVITAMIN) tablet, Take 1 tablet by mouth daily., Disp: , Rfl:    Omega-3 Fatty Acids (FISH OIL) 1000 MG CAPS, Take 1 capsule by mouth daily., Disp: , Rfl:    omeprazole (PRILOSEC) 20 MG capsule, Take 1 capsule (20 mg total) by mouth daily., Disp: 90 capsule, Rfl: 0   polyethylene glycol powder (MIRALAX) powder, Take 1 Container by mouth daily., Disp: , Rfl:    propranolol (INDERAL) 20 MG tablet, Take 1 tablet (20 mg total) by  mouth 3 (three) times daily., Disp: 90 tablet, Rfl: 3   psyllium (METAMUCIL) 58.6 % powder, Take 1 packet by mouth daily., Disp: , Rfl:    sertraline (ZOLOFT) 100 MG tablet, TAKE 1+1/2 TABLETS BY MOUTH DAILY AS DIRECTED, Disp: 45 tablet, Rfl: 3   sertraline (ZOLOFT) 25 MG tablet, Take 25 mg by mouth daily. , Disp: , Rfl:    Spacer/Aero Chamber Mouthpiece MISC, 1 puff by Does not apply route as needed., Disp: 1 each, Rfl: 1   Fluticasone-Salmeterol (WIXELA INHUB) 250-50 MCG/DOSE AEPB, Inhale 1 puff into the lungs 2 (two) times daily., Disp: 180 each, Rfl: 3   magnesium hydroxide (MILK OF MAGNESIA) 400 MG/5ML suspension, Take by mouth daily as needed for mild constipation., Disp: , Rfl:   EXAM:  VITALS per patient if applicable: none  GENERAL: alert, oriented, appears well and in no acute distress  HEENT: atraumatic, conjunttiva clear, no obvious abnormalities on inspection of external nose and ears  NECK: normal movements of the head and neck  LUNGS: on inspection no signs of respiratory distress, breathing rate appears normal, no obvious gross SOB, gasping or wheezing  CV: no obvious cyanosis  MS: moves all visible extremities without noticeable abnormality  PSYCH/NEURO: pleasant and cooperative, no obvious depression or anxiety, speech and thought processing grossly intact  ASSESSMENT AND PLAN:  Discussed the following assessment and plan:  Numbness of left arm and leg I had a long  discussion with the patient regarding her symptoms.  Some of her symptoms could be related to side effect from her inhaled medication though some of her other symptoms are concerning for a neurological issue such as a stroke.  I discussed that my advice would be to be evaluated in the emergency department though she declines this as she does not want to be exposed to anybody who could potentially make her ill.  Given that she does not want to be evaluated emergency department we will proceed with an MRI brain to evaluate for underlying stroke or other underlying neurologic cause.  She will remain off of the wixela.  She will have her mother transport her for the MRI.  We will contact her with the results and then determine the next step in management.  I did discuss that if she has had a stroke she would have to be evaluated in the hospital.    I discussed the assessment and treatment plan with the patient. The patient was provided an opportunity to ask questions and all were answered. The patient agreed with the plan and demonstrated an understanding of the instructions.   The patient was advised to call back or seek an in-person evaluation if the symptoms worsen or if the condition fails to improve as anticipated.   Tommi Rumps, MD

## 2019-02-10 NOTE — Assessment & Plan Note (Signed)
I had a long discussion with the patient regarding her symptoms.  Some of her symptoms could be related to side effect from her inhaled medication though some of her other symptoms are concerning for a neurological issue such as a stroke.  I discussed that my advice would be to be evaluated in the emergency department though she declines this as she does not want to be exposed to anybody who could potentially make her ill.  Given that she does not want to be evaluated emergency department we will proceed with an MRI brain to evaluate for underlying stroke or other underlying neurologic cause.  She will remain off of the wixela.  She will have her mother transport her for the MRI.  We will contact her with the results and then determine the next step in management.  I did discuss that if she has had a stroke she would have to be evaluated in the hospital.

## 2019-02-11 ENCOUNTER — Ambulatory Visit: Payer: Medicare HMO

## 2019-02-20 ENCOUNTER — Other Ambulatory Visit: Payer: Self-pay | Admitting: Family

## 2019-03-05 ENCOUNTER — Encounter: Payer: Self-pay | Admitting: Family Medicine

## 2019-03-05 DIAGNOSIS — M5441 Lumbago with sciatica, right side: Secondary | ICD-10-CM

## 2019-03-05 DIAGNOSIS — G8929 Other chronic pain: Secondary | ICD-10-CM

## 2019-03-05 DIAGNOSIS — M5412 Radiculopathy, cervical region: Secondary | ICD-10-CM

## 2019-03-13 DIAGNOSIS — F4001 Agoraphobia with panic disorder: Secondary | ICD-10-CM | POA: Diagnosis not present

## 2019-03-13 DIAGNOSIS — F39 Unspecified mood [affective] disorder: Secondary | ICD-10-CM | POA: Diagnosis not present

## 2019-03-13 DIAGNOSIS — F410 Panic disorder [episodic paroxysmal anxiety] without agoraphobia: Secondary | ICD-10-CM | POA: Diagnosis not present

## 2019-03-24 ENCOUNTER — Ambulatory Visit
Admission: RE | Admit: 2019-03-24 | Discharge: 2019-03-24 | Disposition: A | Discharge status: Home / Self Care | Payer: Medicare HMO | Source: Ambulatory Visit | Attending: Family Medicine | Admitting: Family Medicine

## 2019-03-24 ENCOUNTER — Other Ambulatory Visit: Payer: Self-pay

## 2019-03-24 ENCOUNTER — Other Ambulatory Visit: Payer: Self-pay | Admitting: Family

## 2019-03-24 ENCOUNTER — Other Ambulatory Visit: Payer: Self-pay | Admitting: Family Medicine

## 2019-03-24 DIAGNOSIS — R200 Anesthesia of skin: Secondary | ICD-10-CM

## 2019-03-24 DIAGNOSIS — M5412 Radiculopathy, cervical region: Secondary | ICD-10-CM

## 2019-03-24 DIAGNOSIS — Z1231 Encounter for screening mammogram for malignant neoplasm of breast: Secondary | ICD-10-CM

## 2019-03-24 DIAGNOSIS — R202 Paresthesia of skin: Secondary | ICD-10-CM

## 2019-03-24 DIAGNOSIS — G8929 Other chronic pain: Secondary | ICD-10-CM | POA: Diagnosis not present

## 2019-03-24 DIAGNOSIS — M5441 Lumbago with sciatica, right side: Secondary | ICD-10-CM | POA: Diagnosis not present

## 2019-03-24 DIAGNOSIS — M542 Cervicalgia: Secondary | ICD-10-CM | POA: Diagnosis not present

## 2019-03-24 DIAGNOSIS — M545 Low back pain: Secondary | ICD-10-CM | POA: Diagnosis not present

## 2019-03-27 ENCOUNTER — Encounter: Payer: Self-pay | Admitting: Neurology

## 2019-04-18 DIAGNOSIS — H2513 Age-related nuclear cataract, bilateral: Secondary | ICD-10-CM | POA: Diagnosis not present

## 2019-04-18 DIAGNOSIS — H524 Presbyopia: Secondary | ICD-10-CM | POA: Diagnosis not present

## 2019-04-25 ENCOUNTER — Other Ambulatory Visit: Payer: Self-pay | Admitting: Family

## 2019-04-28 ENCOUNTER — Other Ambulatory Visit: Payer: Self-pay

## 2019-05-01 ENCOUNTER — Encounter: Payer: Self-pay | Admitting: Neurology

## 2019-05-01 ENCOUNTER — Other Ambulatory Visit: Payer: Self-pay

## 2019-05-01 ENCOUNTER — Ambulatory Visit (INDEPENDENT_AMBULATORY_CARE_PROVIDER_SITE_OTHER): Payer: Medicare HMO | Admitting: Neurology

## 2019-05-01 VITALS — BP 135/86 | HR 75 | Ht 61.5 in | Wt 214.0 lb

## 2019-05-01 DIAGNOSIS — R202 Paresthesia of skin: Secondary | ICD-10-CM | POA: Diagnosis not present

## 2019-05-01 DIAGNOSIS — R200 Anesthesia of skin: Secondary | ICD-10-CM | POA: Diagnosis not present

## 2019-05-01 NOTE — Patient Instructions (Addendum)
Nerve testing of the arms ° °ELECTROMYOGRAM AND NERVE CONDUCTION STUDIES (EMG/NCS) INSTRUCTIONS ° °How to Prepare °The neurologist conducting the EMG will need to know if you have certain medical conditions. Tell the neurologist and other EMG lab personnel if you: °Have a pacemaker or any other electrical medical device °Take blood-thinning medications °Have hemophilia, a blood-clotting disorder that causes prolonged bleeding °Bathing °Take a shower or bath shortly before your exam in order to remove oils from your skin. Don’t apply lotions or creams before the exam.  °What to Expect °You’ll likely be asked to change into a hospital gown for the procedure and lie down on an examination table. The following explanations can help you understand what will happen during the exam.  °Electrodes. The neurologist or a technician places surface electrodes at various locations on your skin depending on where you’re experiencing symptoms. Or the neurologist may insert needle electrodes at different sites depending on your symptoms.  °Sensations. The electrodes will at times transmit a tiny electrical current that you may feel as a twinge or spasm. The needle electrode may cause discomfort or pain that usually ends shortly after the needle is removed. °If you are concerned about discomfort or pain, you may want to talk to the neurologist about taking a short break during the exam.  °Instructions. During the needle EMG, the neurologist will assess whether there is any spontaneous electrical activity when the muscle is at rest - activity that isn’t present in healthy muscle tissue - and the degree of activity when you slightly contract the muscle.  °He or she will give you instructions on resting and contracting a muscle at appropriate times. Depending on what muscles and nerves the neurologist is examining, he or she may ask you to change positions during the exam.  °After your EMG °You may experience some temporary, minor  bruising where the needle electrode was inserted into your muscle. This bruising should fade within several days. If it persists, contact your primary care doctor.  ° °

## 2019-05-01 NOTE — Progress Notes (Signed)
[facility] Clinic Note - Initial Visit   Date: 05/01/19  Brittney Ruiz MRN: [id number] DOB: June 01, 1965   Dear Dr. Birdie Sons:  Thank you for your kind referral of Brittney Ruiz for consultation of paresthesias. Although her history is well known to you, please allow [name] to reiterate it for the purpose of our medical record. The patient was accompanied to the clinic by self    History of Present Illness: Brittney Ruiz is a 53 y.o. right-handed female with asthma, depression/anxiety, history of melanoma presenting for evaluation of bilateral arm numbness, worse on the left hand. Starting around September, she developed tingling over the left palm and numbness over both arms which occurs at night time.  She has tried repositioning, which did not help. She has tried stretching which helps temporarily. There is no weakness of the hands.  She also complains of back pain and numbness of the legs.  Numbness in the legs is very transient and occurs when she is sitting on the commode or a chair, and improve with standing or stretching. She has extensive neurological testing including MRI brain, cervical spine, and lumbar spine which was essentially unremarkable.  Specifically, no evidence of nerve impingement on spine imaging or multiple sclerosis on brain imaging.   Out-side paper records, electronic medical record, and images have been reviewed where available and summarized as:  MRI cervical spine wo contrast 03/24/2019: 1. At C3-4 there is mild right facet arthropathy and mild right foraminal narrowing. 2. No significant disc protrusion. 3.  No acute osseous injury of the cervical spine.  MRI lumbar spine wo contrast 03/24/2019: IMPRESSION: 1. At L3-4 there is a minimal broad-based disc bulge. Mild bilateral facet arthropathy. 2. At L4-5 there is a mild broad-based disc bulge. Severe bilateral facet arthropathy with left facet effusion. 3. At L5-S1 there is a minimal  broad-based disc bulge. Mild bilateral facet arthropathy.  MRI brain wo contrast 02/10/2019: Negative for acute infarct. Solitary small white matter hyperintensity right frontal lobe is nonspecific. Correlate with risk factors for small vessel disease or migraine headaches.   Lab Results  Component Value Date   HGBA1C 5.7 05/02/2018   Lab Results  Component Value Date   VITAMINB12 480 05/02/2018   Lab Results  Component Value Date   TSH 2.31 05/02/2018   No results found for: ESRSEDRATE, POCTSEDRATE  Past Medical History:  Diagnosis Date  . Abnormal Pap smear of cervix    h/o LEEP  . Allergy   . Angio-edema   . Anxiety   . Asthma   . Cancer (HCC)    melanoma 2003  . Constipation   . Depression   . Diarrhea   . Hypertension   . Low back pain   . Melanoma (HCC)    right foot s/p lymph node removed right groin  . Obesity (BMI 30-39.9)   . Urticaria   . Vaginal delivery    X 2    Past Surgical History:  Procedure Laterality Date  . BLADDER SUSPENSION  07/2017  . COLONOSCOPY WITH PROPOFOL N/A 07/01/2017   Procedure: COLONOSCOPY WITH PROPOFOL;  Surgeon: Pasty Spillers, MD;  Location: [facility] ENDOSCOPY;  Service: Endoscopy;  Laterality: N/A;  . ESOPHAGOGASTRODUODENOSCOPY (EGD) WITH PROPOFOL N/A 07/01/2017   Procedure: ESOPHAGOGASTRODUODENOSCOPY (EGD) WITH PROPOFOL;  Surgeon: Pasty Spillers, MD;  Location: [facility] ENDOSCOPY;  Service: Endoscopy;  Laterality: N/A;  . LEEP  1995  . LEEP    . PARTIAL HYSTERECTOMY  08/2017  s/p partial hysterectomy - per patient cervix, uterus, fallopian tubes; ovaries INTACT  . SKIN CANCER EXCISION     melanoma  . TONSILLECTOMY       Medications:  Outpatient Encounter Medications as of 05/01/2019  Medication Sig  . ACCU-CHEK SOFTCLIX LANCETS lancets TEST BLOOD SUGAR UP TO FOUR TIMES DAILY AS DIRECTED  . ACETAMINOPHEN PO Take by mouth as needed.    Marland Kitchen albuterol (PROVENTIL HFA;VENTOLIN HFA) 108 (90 Base) MCG/ACT inhaler Inhale  2 puffs into the lungs every 6 (six) hours as needed.  Marland Kitchen albuterol (PROVENTIL) (2.5 MG/3ML) 0.083% nebulizer solution Take 3 mLs (2.5 mg total) by nebulization every 6 (six) hours as needed for wheezing or shortness of breath.  Marland Kitchen albuterol (VENTOLIN HFA) 108 (90 Base) MCG/ACT inhaler Inhale 2 puffs into the lungs every 6 (six) hours as needed for wheezing or shortness of breath.  Marland Kitchen aspirin EC 81 MG tablet Take 81 mg by mouth daily.  . B Complex Vitamins (BL VITAMIN B COMPLEX PO) Place 5,000 Film under the tongue daily.  . Blood Glucose Monitoring Suppl (ACCU-CHEK AVIVA PLUS) w/Device KIT USE UP TO FOUR TIMES DAILY AS DIRECTED  . cholecalciferol (VITAMIN D) 400 units TABS tablet Take 1,000 Units by mouth.   . clorazepate (TRANXENE) 7.5 MG tablet Take 7.5 mg by mouth 3 (three) times daily.  . fluticasone (FLONASE) 50 MCG/ACT nasal spray USE 2 SPRAYS INTO BOTH NOSTRILS DAILY.  Marland Kitchen glucose blood (ACCU-CHEK AVIVA PLUS) test strip USE UP TO FOUR TIMES DAILY AS DIRECTED  . loratadine (CLARITIN) 10 MG tablet TAKE ONE (1) TABLET BY MOUTH EVERY DAY  . magnesium hydroxide (MILK OF MAGNESIA) 400 MG/5ML suspension Take by mouth daily as needed for mild constipation.  . Multiple Vitamin (MULTIVITAMIN) tablet Take 1 tablet by mouth daily.  . Omega-3 Fatty Acids (FISH OIL) 1000 MG CAPS Take 1 capsule by mouth daily.  Marland Kitchen omeprazole (PRILOSEC) 20 MG capsule TAKE 1 CAPSULE EVERY DAY  . polyethylene glycol powder (MIRALAX) powder Take 1 Container by mouth daily.  . propranolol (INDERAL) 20 MG tablet Take 1 tablet (20 mg total) by mouth 3 (three) times daily.  . psyllium (METAMUCIL) 58.6 % powder Take 1 packet by mouth daily.  . sertraline (ZOLOFT) 100 MG tablet TAKE 1+1/2 TABLETS BY MOUTH DAILY AS DIRECTED  . sertraline (ZOLOFT) 25 MG tablet Take 25 mg by mouth daily.   Marland Kitchen Spacer/Aero Chamber Mouthpiece MISC 1 puff by Does not apply route as needed.  . Fluticasone-Salmeterol (WIXELA INHUB) 250-50 MCG/DOSE AEPB Inhale 1  puff into the lungs 2 (two) times daily.   No facility-administered encounter medications on file as of 05/01/2019.    Allergies:  Allergies  Allergen Reactions  . Pork Allergy Swelling    Other reaction(s): OTHER  . Pork-Derived Products Swelling    Other reaction(s): OTHER Other reaction(s): OTHER Other reaction(s): OTHER   . Sulfur Swelling, Hives and Other (See Comments)  . Aciphex [Rabeprazole Sodium]   . Antivert [Meclizine Hcl]   . Buspar [Buspirone Hcl]   . Cat Hair Extract     Other reaction(s): SHORTNESS OF BREATH  . Chlorpheniramine-Pseudoeph   . Chlorpheniramine-Pseudoeph Other (See Comments)  . Chocolate Flavor   . Codeine   . Crestor [Rosuvastatin Calcium]     Could not tolerate   . Cymbalta [Duloxetine Hcl]   . Duloxetine Other (See Comments)    Other reaction(s): HIVES Other reaction(s): HIVES   . Effexor Xr [Venlafaxine Hydrochloride]   . Epinephrine   .  Erythromycin   . Escitalopram Oxalate   . Lamictal [Lamotrigine]   . Levofloxacin Swelling    Levofloxacin  . Lipitor [Atorvastatin]     Multiple symptoms  . Meclizine Other (See Comments)  . Mometasone Furoate   . Mometasone Furoate   . Monosodium Glutamate     Other reaction(s): OTHER Other reaction(s): OTHER   . Neurontin [Gabapentin]     Hives   . Other     Other reaction(s): HIVES Other reaction(s): HIVES Other reaction(s): HIVES Other reaction(s): HIVES   . Prednisone   . Shellfish Allergy     Other reaction(s): HIVES Other reaction(s): HIVES  . Sudafed [Pseudoephedrine Hcl]   . Sulfa Antibiotics Other (See Comments)    Other reaction(s): HIVES Other reaction(s): HIVES   . Sulfasalazine Other (See Comments)  . Valium   . Wellbutrin [Bupropion Hcl]   . Xanax Xr [Alprazolam]   . Alfalfa Rash  . Brompheniramine-Phenylephrine Palpitations    hypersensitive    . Buspirone Rash and Other (See Comments)  . Tape Rash    Family History: Family History  Problem Relation Age  of Onset  . Hyperlipidemia Mother   . Hypertension Mother   . Diabetes Mother   . Breast cancer Mother 68  . Cancer Father        lung  . Depression Father   . Hyperlipidemia Sister   . Hypertension Sister   . Migraines Sister   . Hypertension Brother   . Hyperlipidemia Brother   . Migraines Brother   . Cancer Maternal Grandmother        brain  . Depression Maternal Grandfather   . Heart disease Maternal Grandfather   . Cancer Paternal Grandfather        unknown    Social History: Social History   Tobacco Use  . Smoking status: Former Smoker    Packs/day: 1.00    Years: 20.00    Pack years: 20.00    Types: Cigarettes    Quit date: 03/09/2008    Years since quitting: 11.1  . Smokeless tobacco: Never Used  Substance Use Topics  . Alcohol use: No  . Drug use: No   Social History   Social History Narrative   Married 25 years.        2 children by 1st husband.       Disabled. Not working.    Right handed      One story home       Review of Systems:  CONSTITUTIONAL: No fevers, chills, night sweats, or weight loss.   EYES: No visual changes or eye pain ENT: No hearing changes.  No history of nose bleeds.   RESPIRATORY: No cough, wheezing and shortness of breath.   CARDIOVASCULAR: Negative for chest pain, and palpitations.   GI: Negative for abdominal discomfort, blood in stools or black stools.  No recent change in bowel habits.   GU:  No history of incontinence.   MUSCLOSKELETAL: +history of joint pain or swelling.  No myalgias.   SKIN: Negative for lesions, rash, and itching.   HEMATOLOGY/ONCOLOGY: Negative for prolonged bleeding, bruising easily, and swollen nodes.  +history of cancer.   ENDOCRINE: Negative for cold or heat intolerance, polydipsia or goiter.   PSYCH:  +depression or anxiety symptoms.   NEURO: As Above.   Vital Signs:  BP 135/86   Pulse 75   Ht 5' 1.5" (1.562 m)   Wt 214 lb (97.1 kg)   LMP  (LMP Unknown) Comment: urine  Preg negative    SpO2 96%   BMI 39.78 kg/m    General Medical Exam:   General:  Well appearing, comfortable.   Eyes/ENT: see cranial nerve examination.   Neck:   No carotid bruits. Respiratory:  Clear to auscultation, good air entry bilaterally.   Cardiac:  Regular rate and rhythm, no murmur.   Extremities:  No deformities, edema, or skin discoloration.  Skin:  No rashes or lesions.  Neurological Exam: MENTAL STATUS including orientation to time, place, person, recent and remote memory, attention span and concentration, language, and fund of knowledge is normal.  Speech is not dysarthric.  CRANIAL NERVES: II:  No visual field defects.   III-IV-VI: Pupils equal round and reactive to light.  Normal conjugate, extra-ocular eye movements in all directions of gaze.  No nystagmus.  No ptosis.   VII:  Normal facial symmetry and movements.   VIII:  Normal hearing and vestibular function.   IX-X:  Normal palatal movement.     XII:  Normal tongue is midline  MOTOR:  No atrophy, fasciculations or abnormal movements.  No pronator drift.   Upper Extremity:  Right  Left  Deltoid  5/5   5/5   Biceps  5/5   5/5   Triceps  5/5   5/5   Infraspinatus 5/5  5/5  Medial pectoralis 5/5  5/5  Wrist extensors  5/5   5/5   Wrist flexors  5/5   5/5   Finger extensors  5/5   5/5   Finger flexors  5/5   5/5   Dorsal interossei  5/5   5/5   Abductor pollicis  5/5   5/5   Tone (Ashworth scale)  0  0   Lower Extremity:  Right  Left  Hip flexors  5/5   5/5   Hip extensors  5/5   5/5   Adductor 5/5  5/5  Abductor 5/5  5/5  Knee flexors  5/5   5/5   Knee extensors  5/5   5/5   Dorsiflexors  5/5   5/5   Plantarflexors  5/5   5/5   Toe extensors  5/5   5/5   Toe flexors  5/5   5/5   Tone (Ashworth scale)  0  0   MSRs:  Right        Left                  brachioradialis 2+  2+  biceps 2+  2+  triceps 2+  2+  patellar 2+  2+  ankle jerk 2+  2+  Hoffman no  no  plantar response down  down   SENSORY:  Normal  and symmetric perception of light touch, pinprick, vibration, and proprioception.  Romberg's sign absent. Negative Tinel's sign at the wrist.   COORDINATION/GAIT: Normal finger-to- nose-finger.  Intact rapid alternating movements bilaterally.  Gait narrow based and stable. Tandem and stressed gait intact.    IMPRESSION: 1. Bilateral hand paresthesias, worse on the left.  Normal neurological exam, MRI brain, cervical spine, and lumbar spine is very reassuring.  Images were personally viewed and agree that there is no evidence of MS or nerve impingement in the spine. NCS/EMG of the arms to evaluate for entrapment neuropathy  2. Transient leg numbness with normal exam and imaging is benign.  She was encouraged to stretch and exercises regularly.   Further recommendations pending results.   Thank you for allowing me to participate in patient's  care.  If I can answer any additional questions, I would be pleased to do so.    Sincerely,    Dhilan Brauer K. Allena Katz, DO

## 2019-05-04 ENCOUNTER — Ambulatory Visit
Admission: RE | Admit: 2019-05-04 | Discharge: 2019-05-04 | Disposition: A | Discharge status: Home / Self Care | Payer: Medicare HMO | Source: Ambulatory Visit | Attending: Family | Admitting: Family

## 2019-05-04 DIAGNOSIS — Z1231 Encounter for screening mammogram for malignant neoplasm of breast: Secondary | ICD-10-CM | POA: Diagnosis not present

## 2019-05-23 ENCOUNTER — Other Ambulatory Visit: Payer: Self-pay

## 2019-05-23 ENCOUNTER — Ambulatory Visit (INDEPENDENT_AMBULATORY_CARE_PROVIDER_SITE_OTHER): Payer: Medicare HMO | Admitting: Neurology

## 2019-05-23 ENCOUNTER — Encounter: Payer: Medicare HMO | Admitting: Neurology

## 2019-05-23 DIAGNOSIS — R200 Anesthesia of skin: Secondary | ICD-10-CM | POA: Diagnosis not present

## 2019-05-23 DIAGNOSIS — G5602 Carpal tunnel syndrome, left upper limb: Secondary | ICD-10-CM

## 2019-05-23 NOTE — Procedures (Signed)
[facility]  [place], [address]  [place], [place] [address] Tel: [phone] Fax:  [phone] Test Date:  05/23/2019  Patient: Brittney Ruiz DOB: [birth year] Physician: Narda Amber, DO  Sex: Female Height: 5\' 1"  Ref Phys: Narda Amber, DO  ID#: [id number] Temp: 33.0C Technician:    Patient Complaints: This is a 54 year old female referred for evaluation of bilateral hand numbness and tingling, worse on the left.  NCV & EMG Findings: Extensive electrodiagnostic testing of the left upper extremity and additional studies of the right shows:  1. Left mixed palmar sensory response shows prolonged latency (Median Palm-Ulnar Palm, 0.6 ms).  Bilateral median, ulnar, and right mixed palmar sensory responses are within normal limits.   2. Bilateral median and ulnar motor responses are within normal limits.   3. There is no evidence of active or chronic motor axonal loss changes affecting any of the tested muscles.  Motor unit configuration and recruitment pattern is within normal limits.    Impression: 1. Left median neuropathy at or distal to the wrist (very mild), consistent with a clinical diagnosis of carpal tunnel syndrome.   2. There is no evidence of right carpal tunnel syndrome or cervical radiculopathy affecting either upper extremity.   ___________________________ Narda Amber, DO    Nerve Conduction Studies Anti Sensory Summary Table   Site NR Peak (ms) Norm Peak (ms) P-T Amp (V) Norm P-T Amp  Left Median Anti Sensory (2nd Digit)  33C  Wrist    2.8 <3.6 45.0 >15  Right Median Anti Sensory (2nd Digit)  33C  Wrist    2.6 <3.6 39.1 >15  Left Ulnar Anti Sensory (5th Digit)  33C  Wrist    2.5 <3.1 36.7 >10  Right Ulnar Anti Sensory (5th Digit)  33C  Wrist    2.3 <3.1 31.2 >10   Motor Summary Table   Site NR Onset (ms) Norm Onset (ms) O-P Amp (mV) Norm O-P Amp Site1 Site2 Delta-0 (ms) Dist (cm) Vel (m/s) Norm Vel (m/s)  Left Median Motor (Abd Poll Brev)   33C  Wrist    3.1 <4.0 9.0 >6 Elbow Wrist 4.3 28.0 65 >50  Elbow    7.4  8.1         Right Median Motor (Abd Poll Brev)  33C  Wrist    2.7 <4.0 9.9 >6 Elbow Wrist 4.0 27.0 68 >50  Elbow    6.7  8.9         Left Ulnar Motor (Abd Dig Minimi)  33C  Wrist    2.3 <3.1 9.5 >7 B Elbow Wrist 3.6 23.0 64 >50  B Elbow    5.9  9.2  A Elbow B Elbow 1.6 10.0 63 >50  A Elbow    7.5  9.3         Right Ulnar Motor (Abd Dig Minimi)  33C  Wrist    1.3 <3.1 7.7 >7 B Elbow Wrist 4.1 23.0 56 >50  B Elbow    5.4  7.6  A Elbow B Elbow 1.8 10.0 56 >50  A Elbow    7.2  7.6          Comparison Summary Table   Site NR Peak (ms) Norm Peak (ms) P-T Amp (V) Site1 Site2 Delta-P (ms) Norm Delta (ms)  Left Median/Ulnar Palm Comparison (Wrist - 8cm)  33C  Median Palm    1.9 <2.2 34.5 Median Palm Ulnar Palm 0.6   Ulnar Palm    1.3 <2.2  14.2      Right Median/Ulnar Palm Comparison (Wrist - 8cm)  33C  Median Palm    1.6 <2.2 61.1 Median Palm Ulnar Palm 0.0   Ulnar Palm    1.6 <2.2 20.8       EMG   Side Muscle Ins Act Fibs Psw Fasc Number Recrt Dur Dur. Amp Amp. Poly Poly. Comment  Right 1stDorInt Nml Nml Nml Nml Nml Nml Nml Nml Nml Nml Nml Nml N/A  Right PronatorTeres Nml Nml Nml Nml Nml Nml Nml Nml Nml Nml Nml Nml N/A  Right Biceps Nml Nml Nml Nml Nml Nml Nml Nml Nml Nml Nml Nml N/A  Right Triceps Nml Nml Nml Nml Nml Nml Nml Nml Nml Nml Nml Nml N/A  Right Deltoid Nml Nml Nml Nml Nml Nml Nml Nml Nml Nml Nml Nml N/A  Left 1stDorInt Nml Nml Nml Nml Nml Nml Nml Nml Nml Nml Nml Nml N/A  Left Abd Poll Brev Nml Nml Nml Nml Nml Nml Nml Nml Nml Nml Nml Nml N/A  Left PronatorTeres Nml Nml Nml Nml Nml Nml Nml Nml Nml Nml Nml Nml N/A  Left Biceps Nml Nml Nml Nml Nml Nml Nml Nml Nml Nml Nml Nml N/A  Left Triceps Nml Nml Nml Nml Nml Nml Nml Nml Nml Nml Nml Nml N/A  Left Deltoid Nml Nml Nml Nml Nml Nml Nml Nml Nml Nml Nml Nml N/A      Waveforms:

## 2019-06-01 ENCOUNTER — Encounter (INDEPENDENT_AMBULATORY_CARE_PROVIDER_SITE_OTHER): Payer: Self-pay | Admitting: Family Medicine

## 2019-06-01 ENCOUNTER — Ambulatory Visit (INDEPENDENT_AMBULATORY_CARE_PROVIDER_SITE_OTHER): Payer: Medicare HMO | Admitting: Family Medicine

## 2019-06-01 ENCOUNTER — Other Ambulatory Visit: Payer: Self-pay

## 2019-06-01 VITALS — BP 126/83 | HR 65 | Temp 97.7°F | Ht 62.0 in | Wt 217.0 lb

## 2019-06-01 DIAGNOSIS — E7849 Other hyperlipidemia: Secondary | ICD-10-CM | POA: Diagnosis not present

## 2019-06-01 DIAGNOSIS — R739 Hyperglycemia, unspecified: Secondary | ICD-10-CM

## 2019-06-01 DIAGNOSIS — M25561 Pain in right knee: Secondary | ICD-10-CM | POA: Diagnosis not present

## 2019-06-01 DIAGNOSIS — F39 Unspecified mood [affective] disorder: Secondary | ICD-10-CM | POA: Diagnosis not present

## 2019-06-01 DIAGNOSIS — R0602 Shortness of breath: Secondary | ICD-10-CM

## 2019-06-01 DIAGNOSIS — Z0289 Encounter for other administrative examinations: Secondary | ICD-10-CM

## 2019-06-01 DIAGNOSIS — Z6839 Body mass index (BMI) 39.0-39.9, adult: Secondary | ICD-10-CM

## 2019-06-01 DIAGNOSIS — R5383 Other fatigue: Secondary | ICD-10-CM | POA: Diagnosis not present

## 2019-06-01 DIAGNOSIS — R42 Dizziness and giddiness: Secondary | ICD-10-CM | POA: Diagnosis not present

## 2019-06-01 DIAGNOSIS — Z91018 Allergy to other foods: Secondary | ICD-10-CM | POA: Diagnosis not present

## 2019-06-01 DIAGNOSIS — G8929 Other chronic pain: Secondary | ICD-10-CM

## 2019-06-01 DIAGNOSIS — K5909 Other constipation: Secondary | ICD-10-CM

## 2019-06-01 DIAGNOSIS — M25562 Pain in left knee: Secondary | ICD-10-CM

## 2019-06-01 DIAGNOSIS — Z1331 Encounter for screening for depression: Secondary | ICD-10-CM | POA: Diagnosis not present

## 2019-06-01 DIAGNOSIS — R7401 Elevation of levels of liver transaminase levels: Secondary | ICD-10-CM | POA: Diagnosis not present

## 2019-06-02 LAB — CBC WITH DIFFERENTIAL/PLATELET
Basophils Absolute: 0 10*3/uL (ref 0.0–0.2)
Basos: 0 %
EOS (ABSOLUTE): 0.3 10*3/uL (ref 0.0–0.4)
Eos: 4 %
Hematocrit: 43.1 % (ref 34.0–46.6)
Hemoglobin: 14.5 g/dL (ref 11.1–15.9)
Immature Grans (Abs): 0 10*3/uL (ref 0.0–0.1)
Immature Granulocytes: 1 %
Lymphocytes Absolute: 1.8 10*3/uL (ref 0.7–3.1)
Lymphs: 26 %
MCH: 28.8 pg (ref 26.6–33.0)
MCHC: 33.6 g/dL (ref 31.5–35.7)
MCV: 86 fL (ref 79–97)
Monocytes Absolute: 0.4 10*3/uL (ref 0.1–0.9)
Monocytes: 6 %
Neutrophils Absolute: 4.4 10*3/uL (ref 1.4–7.0)
Neutrophils: 63 %
Platelets: 175 10*3/uL (ref 150–450)
RBC: 5.04 x10E6/uL (ref 3.77–5.28)
RDW: 13.6 % (ref 11.7–15.4)
WBC: 6.9 10*3/uL (ref 3.4–10.8)

## 2019-06-02 LAB — HEMOGLOBIN A1C
Est. average glucose Bld gHb Est-mCnc: 108 mg/dL
Hgb A1c MFr Bld: 5.4 % (ref 4.8–5.6)

## 2019-06-02 LAB — COMPREHENSIVE METABOLIC PANEL
ALT: 33 IU/L — ABNORMAL HIGH (ref 0–32)
AST: 32 IU/L (ref 0–40)
Albumin/Globulin Ratio: 2 (ref 1.2–2.2)
Albumin: 4.6 g/dL (ref 3.8–4.9)
Alkaline Phosphatase: 88 IU/L (ref 39–117)
BUN/Creatinine Ratio: 27 — ABNORMAL HIGH (ref 9–23)
BUN: 15 mg/dL (ref 6–24)
Bilirubin Total: 0.2 mg/dL (ref 0.0–1.2)
CO2: 25 mmol/L (ref 20–29)
Calcium: 9 mg/dL (ref 8.7–10.2)
Chloride: 103 mmol/L (ref 96–106)
Creatinine, Ser: 0.56 mg/dL — ABNORMAL LOW (ref 0.57–1.00)
GFR calc Af Amer: 124 mL/min/{1.73_m2} (ref >59)
GFR calc non Af Amer: 108 mL/min/{1.73_m2} (ref >59)
Globulin, Total: 2.3 g/dL (ref 1.5–4.5)
Glucose: 91 mg/dL (ref 65–99)
Potassium: 4.4 mmol/L (ref 3.5–5.2)
Sodium: 142 mmol/L (ref 134–144)
Total Protein: 6.9 g/dL (ref 6.0–8.5)

## 2019-06-02 LAB — LIPID PANEL WITH LDL/HDL RATIO
Cholesterol, Total: 210 mg/dL — ABNORMAL HIGH (ref 100–199)
HDL: 34 mg/dL — ABNORMAL LOW (ref >39)
LDL Chol Calc (NIH): 130 mg/dL — ABNORMAL HIGH (ref 0–99)
LDL/HDL Ratio: 3.8 ratio — ABNORMAL HIGH (ref 0.0–3.2)
Triglycerides: 256 mg/dL — ABNORMAL HIGH (ref 0–149)
VLDL Cholesterol Cal: 46 mg/dL — ABNORMAL HIGH (ref 5–40)

## 2019-06-02 LAB — VITAMIN D 25 HYDROXY (VIT D DEFICIENCY, FRACTURES): Vit D, 25-Hydroxy: 28.7 ng/mL — ABNORMAL LOW (ref 30.0–100.0)

## 2019-06-02 LAB — T3: T3, Total: 97 ng/dL (ref 71–180)

## 2019-06-02 LAB — INSULIN, RANDOM: INSULIN: 26.1 u[IU]/mL — ABNORMAL HIGH (ref 2.6–24.9)

## 2019-06-02 LAB — VITAMIN B12: Vitamin B-12: 774 pg/mL (ref 232–1245)

## 2019-06-02 LAB — TSH: TSH: 1.81 u[IU]/mL (ref 0.450–4.500)

## 2019-06-02 LAB — T4, FREE: Free T4: 1.44 ng/dL (ref 0.82–1.77)

## 2019-06-04 DIAGNOSIS — Z6839 Body mass index (BMI) 39.0-39.9, adult: Secondary | ICD-10-CM | POA: Insufficient documentation

## 2019-06-04 DIAGNOSIS — M25561 Pain in right knee: Secondary | ICD-10-CM | POA: Insufficient documentation

## 2019-06-04 DIAGNOSIS — G8929 Other chronic pain: Secondary | ICD-10-CM | POA: Insufficient documentation

## 2019-06-04 DIAGNOSIS — Z91018 Allergy to other foods: Secondary | ICD-10-CM | POA: Insufficient documentation

## 2019-06-04 DIAGNOSIS — F39 Unspecified mood [affective] disorder: Secondary | ICD-10-CM | POA: Insufficient documentation

## 2019-06-04 NOTE — Progress Notes (Signed)
Chief Complaint:   OBESITY Brittney Ruiz (MR# [id number]) is a 54 y.o. female who presents for evaluation and treatment of obesity and related comorbidities. Current BMI is Body mass index is 39.69 kg/m.Brittney Ruiz has been struggling with her weight for many years and has been unsuccessful in either losing weight, maintaining weight loss, or reaching her healthy weight goal.  Brittney Ruiz is currently in the action stage of change and ready to dedicate time achieving and maintaining a healthier weight. Brittney Ruiz is interested in becoming our patient and working on intensive lifestyle modifications including (but not limited to) diet and exercise for weight loss.  Brittney Ruiz initially gained 90 pounds with Zoloft.  She tried the keto diet last year, and it caused lots of GI issues.  She lost 18 pounds with [employer] and portion control.  Shakeology was expensive and used Engineer, civil (consulting).  She had a back injury and was seeing a chiropractor over the summer.  Brittney Ruiz's habits were reviewed today and are as follows: Her family eats meals together, her desired weight loss is 97 pounds, she has been heavy most of her life, she started gaining weight since the 8th grade (last 24 years), her heaviest weight ever was 234 pounds, she sometimes craves sweets, she skips meals if she doesn't plan them out ahead of time, and she struggles with emotional eating.  Depression Screen Brittney Ruiz's Food and Mood (modified PHQ-9) score was 23.  Depression screen PHQ 2/9 06/01/2019  Decreased Interest 3  Down, Depressed, Hopeless 3  PHQ - 2 Score 6  Altered sleeping 3  Tired, decreased energy 3  Change in appetite 3  Feeling bad or failure about yourself  3  Trouble concentrating 2  Moving slowly or fidgety/restless 3  Suicidal thoughts 0  PHQ-9 Score 23  Difficult doing work/chores -   Subjective:   1. Other fatigue Brittney Ruiz denies daytime somnolence and sometimes wakes up still tired. Patent has a history of symptoms of occasional  morning headache and snoring. Brittney Ruiz generally gets 9 or 10 hours of sleep per night, and states that she generally has generally restful sleep. Snoring is present. Apneic episodes are present. Epworth Sleepiness Score is 7.  2. SOB (shortness of breath) on exertion Brittney Ruiz notes increasing shortness of breath with exercising and seems to be worsening over time with weight gain. She notes getting out of breath sooner with activity than she used to. This has gotten worse recently. Brittney Ruiz denies shortness of breath at rest or orthopnea.  3. Chronic pain of both knees Brittney Ruiz has bilateral knee pain, left greater than right.  4. Vertigo This is a chronic condition.  5. Multiple food allergies GI would like Brittney Ruiz to be on a low FODMAP diet.  We have reviewed the diet plan together.  Brittney Ruiz also sees an Horticulturist, commercial.    6. Chronic constipation Brittney Ruiz notes constipation. She takes Metamucil and MiraLAX daily.   7. Other hyperlipidemia Brittney Ruiz has hyperlipidemia and has been trying to improve her cholesterol levels with intensive lifestyle modification including a low saturated fat diet, exercise and weight loss. She denies any chest pain, claudication or myalgias.  Taking Zetia 1.25 mg once weekly.  Lab Results  Component Value Date   ALT 33 (H) 06/01/2019   AST 32 06/01/2019   ALKPHOS 88 06/01/2019   BILITOT 0.2 06/01/2019   Lab Results  Component Value Date   CHOL 210 (H) 06/01/2019   HDL 34 (L) 06/01/2019   LDLCALC 130 (H) 06/01/2019  LDLDIRECT 141.0 05/02/2018   TRIG 256 (H) 06/01/2019   CHOLHDL 8 05/02/2018   8. Mood disorder ([place]) The patient carries a diagnosis of mood disorder.  9. Hyperglycemia Brittney Ruiz has a history of some elevated blood glucose readings without a diagnosis of diabetes. She denies polyphagia.  10. Depression screening The patient was screened for depression today as part of her new patient paperwork.  Assessment/Plan:   1. Other fatigue Brittney Ruiz does feel that her weight is  causing her energy to be lower than it should be. Fatigue may be related to obesity, depression or many other causes. Labs will be ordered, and in the meanwhile, Brittney Ruiz will focus on self care including making healthy food choices, increasing physical activity and focusing on stress reduction.  Orders - EKG 12-Lead - CBC w/Diff/Platelet - Vitamin D (25 hydroxy) - B12 - T3 - T4, free - TSH  2. SOB (shortness of breath) on exertion Brittney Ruiz does feel that she gets out of breath more easily that she used to when she exercises. Brittney Ruiz's shortness of breath appears to be obesity related and exercise induced. She has agreed to work on weight loss and gradually increase exercise to treat her exercise induced shortness of breath. Will continue to monitor closely.  Orders - CBC w/Diff/Platelet - HgB A1c - Insulin, random - Vitamin D (25 hydroxy) - B12 - T3 - T4, free - TSH  3. Chronic pain of both knees Will monitor.  4. Vertigo Will continue to monitor.  5. Multiple food allergies Will monitor.  6. Chronic constipation Brittney Ruiz was informed that a decrease in bowel movement frequency is normal while losing weight, but stools should not be hard or painful. Orders and follow up as documented in patient record.   Counseling Getting to Good Bowel Health: Your goal is to have one soft bowel movement each day. Drink at least 8 glasses of water each day. Eat plenty of fiber (goal is over 25 grams each day). It is best to get most of your fiber from dietary sources which includes leafy green vegetables, fresh fruit, and whole grains. You may need to add fiber with the help of OTC fiber supplements. These include Metamucil, Citrucel, and Flaxseed. If you are still having trouble, try adding Miralax or Magnesium Citrate. If all of these changes do not work, Cabin crew.  7. Other hyperlipidemia Cardiovascular risk and specific lipid/LDL goals reviewed.  We discussed several lifestyle  modifications today and Brittney Ruiz will continue to work on diet, exercise and weight loss efforts. Orders and follow up as documented in patient record.   Counseling Intensive lifestyle modifications are the first line treatment for this issue. . Dietary changes: Increase soluble fiber. Decrease simple carbohydrates. . Exercise changes: Moderate to vigorous-intensity aerobic activity 150 minutes per week if tolerated. . Lipid-lowering medications: see documented in medical record.  Orders - Comprehensive Metabolic Panel (CMET) - CBC w/Diff/Platelet - HgB A1c - Insulin, random - Lipid Panel With LDL/HDL Ratio  8. Mood disorder ([place]) Will monitor.  9. Hyperglycemia Fasting labs will be obtained and results with be discussed with Brittney Ruiz in 2 weeks at her follow up visit. In the meanwhile Brittney Ruiz was started on a lower simple carbohydrate diet and will work on weight loss efforts.  Orders - HgB A1c - Insulin, random  10. Depression screening Brittney Ruiz had a positive depression screening. Depression is commonly associated with obesity and often results in emotional eating behaviors. We will monitor this closely and work on CBT to  help improve the non-hunger eating patterns. Referral to Psychology may be required if no improvement is seen as she continues in our clinic.  11. Class 2 severe obesity with serious comorbidity and body mass index (BMI) of 39.0 to 39.9 in adult, unspecified obesity type [facility]) Brittney Ruiz is currently in the action stage of change and her goal is to continue with weight loss efforts. I recommend Brittney Ruiz begin the structured treatment plan as follows:  She has agreed to on the Category 3 Plan.  Exercise goals: For substantial health benefits, adults should do at least 150 minutes (2 hours and 30 minutes) a week of moderate-intensity, or 75 minutes (1 hour and 15 minutes) a week of vigorous-intensity aerobic physical activity, or an equivalent combination of moderate- and  vigorous-intensity aerobic activity. Aerobic activity should be performed in episodes of at least 10 minutes, and preferably, it should be spread throughout the week. Adults should also include muscle-strengthening activities that involve all major muscle groups on 2 or more days a week.   Behavioral modification strategies: increasing Brittney Ruiz protein intake, decreasing simple carbohydrates, increasing vegetables, increasing water intake to 64 ounces a day and decreasing liquid calories.  She was informed of the importance of frequent follow-up visits to maximize her success with intensive lifestyle modifications for her multiple health conditions. She was informed we would discuss her lab results at her next visit unless there is a critical issue that needs to be addressed sooner. Brittney Ruiz agreed to keep her next visit at the agreed upon time to discuss these results.  Objective:   Blood pressure 126/83, pulse 65, temperature 97.7 F (36.5 C), height 5\' 2"  (1.575 m), weight 217 lb (98.4 kg), SpO2 97 %. Body mass index is 39.69 kg/m.  EKG: Normal sinus rhythm, rate 68 bpm.  Indirect Calorimeter completed today shows a VO2 of 326 and a REE of 2268.  Her calculated basal metabolic rate is [id number] thus her basal metabolic rate is better than expected.  General: Cooperative, alert, well developed, in no acute distress. HEENT: Conjunctivae and lids unremarkable. Cardiovascular: Regular rhythm.  Lungs: Normal work of breathing. Neurologic: No focal deficits.   Lab Results  Component Value Date   CREATININE 0.56 (L) 06/01/2019   BUN 15 06/01/2019   NA 142 06/01/2019   K 4.4 06/01/2019   CL 103 06/01/2019   CO2 25 06/01/2019   Lab Results  Component Value Date   ALT 33 (H) 06/01/2019   AST 32 06/01/2019   ALKPHOS 88 06/01/2019   BILITOT 0.2 06/01/2019   Lab Results  Component Value Date   HGBA1C 5.4 06/01/2019   HGBA1C 5.7 05/02/2018   HGBA1C 5.4 04/20/2016   HGBA1C 5.4 05/28/2015   HGBA1C  5.4 10/02/2014   Lab Results  Component Value Date   INSULIN 26.1 (H) 06/01/2019   Lab Results  Component Value Date   TSH 1.810 06/01/2019   Lab Results  Component Value Date   CHOL 210 (H) 06/01/2019   HDL 34 (L) 06/01/2019   LDLCALC 130 (H) 06/01/2019   LDLDIRECT 141.0 05/02/2018   TRIG 256 (H) 06/01/2019   CHOLHDL 8 05/02/2018   Lab Results  Component Value Date   WBC 6.9 06/01/2019   HGB 14.5 06/01/2019   HCT 43.1 06/01/2019   MCV 86 06/01/2019   PLT 175 06/01/2019   Lab Results  Component Value Date   FERRITIN 111 08/12/2012   Obesity Behavioral Intervention:   Approximately 15 minutes were spent on the discussion below.  ASK: We discussed the diagnosis of obesity with Swetha today and Brittney Ruiz agreed to give [name] permission to discuss obesity behavioral modification therapy today.  ASSESS: Brittney Ruiz has the diagnosis of obesity and her BMI today is 39.8. Brittney Ruiz is in the action stage of change.   ADVISE: Brittney Ruiz was educated on the multiple health risks of obesity as well as the benefit of weight loss to improve her health. She was advised of the need for long term treatment and the importance of lifestyle modifications to improve her current health and to decrease her risk of future health problems.  AGREE: Multiple dietary modification options and treatment options were discussed and Brittney Ruiz agreed to follow the recommendations documented in the above note.  ARRANGE: Brittney Ruiz was educated on the importance of frequent visits to treat obesity as outlined per CMS and USPSTF guidelines and agreed to schedule her next follow up appointment today.  Attestation Statements:   This is the patient's first visit at [facility]. The patient's NEW PATIENT PACKET was reviewed at length. Included in the packet: current and past health history, medications, allergies, ROS, gynecologic history (women only), surgical history, family history, social history, weight history, weight  loss surgery history (for those that have had weight loss surgery), nutritional evaluation, mood and food questionnaire, PHQ9, Epworth questionnaire, sleep habits questionnaire, patient life and health improvement goals questionnaire. These will all be scanned into the patient's chart under media.   During the visit, I independently reviewed the patient's EKG, bioimpedance scale results, and indirect calorimeter results. I used this information to tailor a meal plan for the patient that will help her to lose weight and will improve her obesity-related conditions going forward. I performed a medically necessary appropriate examination and/or evaluation. I discussed the assessment and treatment plan with the patient. The patient was provided an opportunity to ask questions and all were answered. The patient agreed with the plan and demonstrated an understanding of the instructions. Labs were ordered at this visit and will be reviewed at the next visit unless more critical results need to be addressed immediately. Clinical information was updated and documented in the EMR.   Time spent on visit including pre-visit chart review and post-visit care was 64 minutes.   A separate 15 minutes was spent on risk counseling (see above).   I, Water quality scientist, CMA, am acting as Location manager for [employer], DO.  I have reviewed the above documentation for accuracy and completeness, and I agree with the above. Briscoe Deutscher, DO

## 2019-06-12 DIAGNOSIS — F4001 Agoraphobia with panic disorder: Secondary | ICD-10-CM | POA: Diagnosis not present

## 2019-06-12 DIAGNOSIS — F39 Unspecified mood [affective] disorder: Secondary | ICD-10-CM | POA: Diagnosis not present

## 2019-06-12 DIAGNOSIS — F410 Panic disorder [episodic paroxysmal anxiety] without agoraphobia: Secondary | ICD-10-CM | POA: Diagnosis not present

## 2019-06-15 ENCOUNTER — Ambulatory Visit (INDEPENDENT_AMBULATORY_CARE_PROVIDER_SITE_OTHER): Payer: Medicare HMO | Admitting: Family Medicine

## 2019-06-15 ENCOUNTER — Other Ambulatory Visit: Payer: Self-pay

## 2019-06-15 VITALS — BP 111/74 | HR 60 | Temp 98.0°F | Ht 62.0 in | Wt 208.0 lb

## 2019-06-15 DIAGNOSIS — R0683 Snoring: Secondary | ICD-10-CM

## 2019-06-15 DIAGNOSIS — F39 Unspecified mood [affective] disorder: Secondary | ICD-10-CM

## 2019-06-15 DIAGNOSIS — R7989 Other specified abnormal findings of blood chemistry: Secondary | ICD-10-CM

## 2019-06-15 DIAGNOSIS — Z91018 Allergy to other foods: Secondary | ICD-10-CM

## 2019-06-15 DIAGNOSIS — K5909 Other constipation: Secondary | ICD-10-CM

## 2019-06-15 DIAGNOSIS — Z6838 Body mass index (BMI) 38.0-38.9, adult: Secondary | ICD-10-CM

## 2019-06-15 DIAGNOSIS — K219 Gastro-esophageal reflux disease without esophagitis: Secondary | ICD-10-CM

## 2019-06-15 DIAGNOSIS — E559 Vitamin D deficiency, unspecified: Secondary | ICD-10-CM

## 2019-06-15 DIAGNOSIS — E8881 Metabolic syndrome: Secondary | ICD-10-CM

## 2019-06-15 DIAGNOSIS — E7849 Other hyperlipidemia: Secondary | ICD-10-CM | POA: Diagnosis not present

## 2019-06-15 MED ORDER — VITAMIN D (ERGOCALCIFEROL) 1.25 MG (50000 UNIT) PO CAPS: 50000.0000 [IU] | ORAL_CAPSULE | ORAL | 0 refills | Status: DC

## 2019-06-15 NOTE — Progress Notes (Signed)
Chief Complaint:   OBESITY Brittney Ruiz is here to discuss her progress with her obesity treatment plan along with follow-up of her obesity related diagnoses. Brittney Ruiz is on the Category 3 Plan and states she is following her eating plan approximately 100% of the time. Brittney Ruiz states she is exercising for 0 minutes 0 times per week.  Today's visit was #: 2 Starting weight: 217 lbs Starting date: 06/01/2019 Today's weight: 208 lbs Today's date: 06/15/2019 Total lbs lost to date: 9 lbs Total lbs lost since last in-office visit: 9 lbs  Interim History: Brittney Ruiz is drinking more than 100 ounces of water per day and is following the diet plan well.  She reports that she ate out twice since last visit and made good decisions when doing so.  She vacuumed last week and felt that she had more energy.  Subjective:   1. Vitamin D deficiency Brittney Ruiz's Vitamin D level was 28.7 on 06/01/2019. She is currently taking vit D 1000 IU daily. She denies nausea, vomiting or muscle weakness.  2. Gastroesophageal reflux disease She has been experiencing acid reflux.   3. Other hyperlipidemia Brittney Ruiz has hyperlipidemia and has been trying to improve her cholesterol levels with intensive lifestyle modification including a low saturated fat diet, exercise and weight loss. She denies any chest pain, claudication or myalgias.  She did not tolerate Lipitor in the past.  She is taking Zetia 1.25 once weekly.  Lab Results  Component Value Date   ALT 33 (H) 06/01/2019   AST 32 06/01/2019   ALKPHOS 88 06/01/2019   BILITOT 0.2 06/01/2019   Lab Results  Component Value Date   CHOL 210 (H) 06/01/2019   HDL 34 (L) 06/01/2019   LDLCALC 130 (H) 06/01/2019   LDLDIRECT 141.0 05/02/2018   TRIG 256 (H) 06/01/2019   CHOLHDL 8 05/02/2018   4. Chronic constipation Brittney Ruiz notes constipation.  She takes MiraLAX and Metamucil daily.  5. Elevated LFTs Brittney Ruiz has a new dx of elevated ALT. Her BMI is 38.2. She denies abdominal pain or jaundice  and has never been told of any liver problems in the past. She denies excessive alcohol intake.  Lab Results  Component Value Date   ALT 33 (H) 06/01/2019   AST 32 06/01/2019   ALKPHOS 88 06/01/2019   BILITOT 0.2 06/01/2019   6. Multiple food allergies Brittney Ruiz has multiple food and medication allergies.  7. Snoring Brittney Ruiz snores and has apnea. Her Epworth Sleepiness Score is 7.  8. Insulin resistance Brittney Ruiz has a diagnosis of insulin resistance based on her elevated fasting insulin level >5. She continues to work on diet and exercise to decrease her risk of diabetes.  Lab Results  Component Value Date   INSULIN 26.1 (H) 06/01/2019   Lab Results  Component Value Date   HGBA1C 5.4 06/01/2019   9. Mood disorder (Brittney Ruiz) Brittney Ruiz is struggling with emotional eating and using food for comfort to the extent that it is negatively impacting her health. She has been working on behavior modification techniques to help reduce her emotional eating and has been unsuccessful. She shows no sign of suicidal or homicidal ideations.  Per patient, much of her current weight gain is from an SSRI.  Assessment/Plan:   1. Vitamin D deficiency Low Vitamin D level contributes to fatigue and are associated with obesity, breast, and colon cancer. She agrees to continue to take prescription Vitamin D @50 ,000 IU every week and will follow-up for routine testing of Vitamin D, at least  2-3 times per year to avoid over-replacement.  Orders - Vitamin D, Ergocalciferol, (DRISDOL) 1.25 MG (50000 UNIT) CAPS capsule; Take 1 capsule (50,000 Units total) by mouth every 7 (seven) days.  Dispense: 4 capsule; Refill: 0  2. Gastroesophageal reflux disease, unspecified whether esophagitis present Intensive lifestyle modifications are the first line treatment for this issue. We discussed several lifestyle modifications today and she will continue to work on diet, exercise and weight loss efforts. Orders and follow up as documented in  patient record.   Counseling . If a person has gastroesophageal reflux disease (GERD), food and stomach acid move back up into the esophagus and cause symptoms or problems such as damage to the esophagus. . Anti-reflux measures include: raising the head of the bed, avoiding tight clothing or belts, avoiding eating late at night, not lying down shortly after mealtime, and achieving weight loss. . Avoid ASA, NSAID's, caffeine, alcohol, and tobacco.  . OTC Pepcid and/or Tums are often very helpful for as needed use.  Marland Kitchen However, for persisting chronic or daily symptoms, stronger medications like Omeprazole may be needed. . You may need to avoid foods and drinks such as: ? Coffee and tea (with or without caffeine). ? Drinks that contain alcohol. ? Energy drinks and sports drinks. ? Bubbly (carbonated) drinks or sodas. ? Chocolate and cocoa. ? Peppermint and mint flavorings. ? Garlic and onions. ? Horseradish. ? Spicy and acidic foods. These include peppers, chili powder, curry powder, vinegar, hot sauces, and BBQ sauce. ? Citrus fruit juices and citrus fruits, such as oranges, lemons, and limes. ? Tomato-based foods. These include red sauce, chili, salsa, and pizza with red sauce. ? Fried and fatty foods. These include donuts, french fries, potato chips, and high-fat dressings. ? High-fat meats. These include hot dogs, rib eye steak, sausage, ham, and bacon.  3. Other hyperlipidemia Cardiovascular risk and specific lipid/LDL goals reviewed.  We discussed several lifestyle modifications today and Brittney Ruiz will continue to work on diet, exercise and weight loss efforts. Orders and follow up as documented in patient record.   Counseling Intensive lifestyle modifications are the first line treatment for this issue. . Dietary changes: Increase soluble fiber. Decrease simple carbohydrates. . Exercise changes: Moderate to vigorous-intensity aerobic activity 150 minutes per week if  tolerated. . Lipid-lowering medications: see documented in medical record.  4. Chronic constipation Brittney Ruiz was informed that a decrease in bowel movement frequency is normal while losing weight, but stools should not be hard or painful. Orders and follow up as documented in patient record.   Counseling Getting to Good Bowel Health: Your goal is to have one soft bowel movement each day. Drink at least 8 glasses of water each day. Eat plenty of fiber (goal is over 25 grams each day). It is best to get most of your fiber from dietary sources which includes leafy green vegetables, fresh fruit, and whole grains. You may need to add fiber with the help of OTC fiber supplements. These include Metamucil, Citrucel, and Flaxseed. If you are still having trouble, try adding Miralax or Magnesium Citrate. If all of these changes do not work, Cabin crew.  5. Elevated LFTs We discussed the likely diagnosis of non-alcoholic fatty liver disease today and how this condition is obesity related. Brittney Ruiz was educated the importance of weight loss. Brittney Ruiz to continue with her weight loss efforts with healthier diet and exercise as an essential part of her treatment plan.  Will check ultrasound of the liver if not improving.  6. Multiple food allergies Will monitor.  7. Snoring Intensive lifestyle modifications are the first line treatment for this issue. We discussed several lifestyle modifications today and she will continue to work on diet, exercise and weight loss efforts. We will continue to monitor. Orders and follow up as documented in patient record.   Counseling  Sleep apnea is a condition in which breathing pauses or becomes shallow during sleep. This happens over and over during the night. This disrupts your sleep and keeps your body from getting the rest that it needs, which can cause tiredness and lack of energy (fatigue) during the day.  Sleep apnea treatment: If you were given a device to  open your airway while you sleep, USE IT!  Sleep hygiene:   Limit or avoid alcohol, caffeinated beverages, and cigarettes, especially close to bedtime.   Do not eat a large meal or eat spicy foods right before bedtime. This can lead to digestive discomfort that can make it hard for you to sleep.  Keep a sleep diary to help you and your health care provider figure out what could be causing your insomnia.  . Make your bedroom a dark, comfortable place where it is easy to fall asleep. ? Put up shades or blackout curtains to block light from outside. ? Use a white noise machine to block noise. ? Keep the temperature cool. . Limit screen use before bedtime. This includes: ? Watching TV. ? Using your smartphone, tablet, or computer. . Stick to a routine that includes going to bed and waking up at the same times every day and night. This can help you fall asleep faster. Consider making a quiet activity, such as reading, part of your nighttime routine. . Try to avoid taking naps during the day so that you sleep better at night. . Get out of bed if you are still awake after 15 minutes of trying to sleep. Keep the lights down, but try reading or doing a quiet activity. When you feel sleepy, go back to bed.  8. Insulin resistance Brittney Ruiz will continue to work on weight loss, exercise, and decreasing simple carbohydrates to help decrease the risk of diabetes. Brittney Ruiz Ruiz to follow-up with [name] as directed to closely monitor her progress.  Will consider metformin in the future.  9. Mood disorder (Brittney Ruiz) Behavior modification techniques were discussed today to help Brittney Ruiz deal with her emotional/non-hunger eating behaviors.  Orders and follow up as documented in patient record.   10. Class 2 severe obesity with serious comorbidity and body mass index (BMI) of 38.0 to 38.9 in adult, unspecified obesity type Brittney Ruiz) Brittney Ruiz is currently in the action stage of change. As such, her goal is to continue with weight loss  efforts. She has Ruiz to the Category 3 Plan.   Exercise goals: For substantial health benefits, adults should do at least 150 minutes (2 hours and 30 minutes) a week of moderate-intensity, or 75 minutes (1 hour and 15 minutes) a week of vigorous-intensity aerobic physical activity, or an equivalent combination of moderate- and vigorous-intensity aerobic activity. Aerobic activity should be performed in episodes of at least 10 minutes, and preferably, it should be spread throughout the week. Adults should also include muscle-strengthening activities that involve all major muscle groups on 2 or more days a week.  Behavioral modification strategies: increasing lean protein intake and planning for success.  Ida has Ruiz to follow-up with our clinic in 2 weeks. She was informed of the importance of frequent follow-up visits to  maximize her success with intensive lifestyle modifications for her multiple health conditions.   Objective:   Blood pressure 111/74, pulse 60, temperature 98 F (36.7 C), temperature source Oral, height 5\' 2"  (1.575 m), weight 208 lb (94.3 kg), SpO2 96 %. Body mass index is 38.04 kg/m.  General: Cooperative, alert, well developed, in no acute distress. HEENT: Conjunctivae and lids unremarkable. Cardiovascular: Regular rhythm.  Lungs: Normal work of breathing. Neurologic: No focal deficits.   Lab Results  Component Value Date   CREATININE 0.56 (L) 06/01/2019   BUN 15 06/01/2019   NA 142 06/01/2019   K 4.4 06/01/2019   CL 103 06/01/2019   CO2 25 06/01/2019   Lab Results  Component Value Date   ALT 33 (H) 06/01/2019   AST 32 06/01/2019   ALKPHOS 88 06/01/2019   BILITOT 0.2 06/01/2019   Lab Results  Component Value Date   HGBA1C 5.4 06/01/2019   HGBA1C 5.7 05/02/2018   HGBA1C 5.4 04/20/2016   HGBA1C 5.4 05/28/2015   HGBA1C 5.4 10/02/2014   Lab Results  Component Value Date   INSULIN 26.1 (H) 06/01/2019   Lab Results  Component Value Date   TSH  1.810 06/01/2019   Lab Results  Component Value Date   CHOL 210 (H) 06/01/2019   HDL 34 (L) 06/01/2019   LDLCALC 130 (H) 06/01/2019   LDLDIRECT 141.0 05/02/2018   TRIG 256 (H) 06/01/2019   CHOLHDL 8 05/02/2018   Lab Results  Component Value Date   WBC 6.9 06/01/2019   HGB 14.5 06/01/2019   HCT 43.1 06/01/2019   MCV 86 06/01/2019   PLT 175 06/01/2019   Lab Results  Component Value Date   FERRITIN 111 08/12/2012   Attestation Statements:   Reviewed by clinician on day of visit: allergies, medications, problem list, medical history, surgical history, family history, social history, and previous encounter notes.  I performed a medically necessary appropriate examination and/or evaluation. I discussed the assessment and treatment plan with the patient. The patient was provided an opportunity to ask questions and all were answered. The patient Ruiz with the plan and demonstrated an understanding of the instructions. Clinical information was updated and documented in the EMR. Time spent on visit including pre-visit chart review and post-visit care was 45 minutes.  I, Water quality scientist, CMA, am acting as Location manager for [employer], DO.  I have reviewed the above documentation for accuracy and completeness, and I agree with the above. Briscoe Deutscher, DO

## 2019-06-22 ENCOUNTER — Encounter (INDEPENDENT_AMBULATORY_CARE_PROVIDER_SITE_OTHER): Payer: Self-pay | Admitting: Family Medicine

## 2019-06-22 NOTE — Telephone Encounter (Signed)
Please advise 

## 2019-06-28 ENCOUNTER — Other Ambulatory Visit: Payer: Self-pay

## 2019-06-28 ENCOUNTER — Encounter (INDEPENDENT_AMBULATORY_CARE_PROVIDER_SITE_OTHER): Payer: Self-pay | Admitting: Physician Assistant

## 2019-06-28 ENCOUNTER — Ambulatory Visit (INDEPENDENT_AMBULATORY_CARE_PROVIDER_SITE_OTHER): Payer: Medicare HMO | Admitting: Physician Assistant

## 2019-06-28 VITALS — BP 97/67 | HR 68 | Temp 98.0°F | Ht 62.0 in | Wt 206.0 lb

## 2019-06-28 DIAGNOSIS — M722 Plantar fascial fibromatosis: Secondary | ICD-10-CM

## 2019-06-28 DIAGNOSIS — E559 Vitamin D deficiency, unspecified: Secondary | ICD-10-CM

## 2019-06-28 DIAGNOSIS — Z6837 Body mass index (BMI) 37.0-37.9, adult: Secondary | ICD-10-CM | POA: Diagnosis not present

## 2019-06-28 DIAGNOSIS — E7849 Other hyperlipidemia: Secondary | ICD-10-CM | POA: Diagnosis not present

## 2019-06-29 MED ORDER — VITAMIN D (ERGOCALCIFEROL) 1.25 MG (50000 UNIT) PO CAPS: 50000.0000 [IU] | ORAL_CAPSULE | ORAL | 0 refills | Status: DC

## 2019-06-29 NOTE — Progress Notes (Signed)
Chief Complaint:   OBESITY Brittney Ruiz is here to discuss her progress with her obesity treatment plan along with follow-up of her obesity related diagnoses. Brittney Ruiz is on the Category 3 Plan and states she is following her eating plan approximately 100% of the time. Brittney Ruiz states she is exercising for 0 minutes 0 times per week.  Today's visit was #: 3 Starting weight: 217 lbs Starting date: 06/01/2019 Today's weight: 206 lbs Today's date: 06/28/2019 Total lbs lost to date: 11 lbs Total lbs lost since last in-office visit: 2 lbs  Interim History: Brittney Ruiz reports that she has been following the plan very well.  She is using yogurt and milk at night to ensure she is getting all of her protein for dinner.  Subjective:   1. Vitamin D deficiency Brittney Ruiz's Vitamin D level was 28.7 on 06/01/2019. She is currently taking vit D. She denies nausea, vomiting or muscle weakness.  2. Other hyperlipidemia Brittney Ruiz has hyperlipidemia and has been trying to improve her cholesterol levels with intensive lifestyle modification including a low saturated fat diet and weight loss. She denies any chest pain, claudication or myalgias.  She is taking no medications.  Not currently exercising due to plantar fasciitis.  Lab Results  Component Value Date   ALT 33 (H) 06/01/2019   AST 32 06/01/2019   ALKPHOS 88 06/01/2019   BILITOT 0.2 06/01/2019   Lab Results  Component Value Date   CHOL 210 (H) 06/01/2019   HDL 34 (L) 06/01/2019   LDLCALC 130 (H) 06/01/2019   LDLDIRECT 141.0 05/02/2018   TRIG 256 (H) 06/01/2019   CHOLHDL 8 05/02/2018   3. Plantar fasciitis Brittney Ruiz has pain in her heel and foot with ambulation.  She is stretching her calves and it feels slightly better.  She also wears plantar fasciitis braces.  Assessment/Plan:   1. Vitamin D deficiency Low Vitamin D level contributes to fatigue and are associated with obesity, breast, and colon cancer. She agrees to continue to take prescription Vitamin D @50 ,000  IU every week and will follow-up for routine testing of Vitamin D, at least 2-3 times per year to avoid over-replacement. - Vitamin D, Ergocalciferol, (DRISDOL) 1.25 MG (50000 UNIT) CAPS capsule; Take 1 capsule (50,000 Units total) by mouth every 7 (seven) days.  Dispense: 4 capsule; Refill: 0  2. Other hyperlipidemia Cardiovascular risk and specific lipid/LDL goals reviewed.  We discussed several lifestyle modifications today and Islee will continue to work on diet, exercise and weight loss efforts. Orders and follow up as documented in patient record.   Counseling Intensive lifestyle modifications are the first line treatment for this issue. . Dietary changes: Increase soluble fiber. Decrease simple carbohydrates. . Exercise changes: Moderate to vigorous-intensity aerobic activity 150 minutes per week if tolerated. . Lipid-lowering medications: see documented in medical record.  3. Plantar fasciitis Recommend cupping, ice water bottle rolls, stretching of foot and calves.  4. Class 2 severe obesity with serious comorbidity and body mass index (BMI) of 37.0 to 37.9 in adult, unspecified obesity type [facility]) Brittney Ruiz is currently in the action stage of change. As such, her goal is to continue with weight loss efforts. She has agreed to the Category 3 Plan.   Exercise goals: All adults should avoid inactivity. Some physical activity is better than none, and adults who participate in any amount of physical activity gain some health benefits.  Behavioral modification strategies: increasing lean protein intake and meal planning and cooking strategies.  Brittney Ruiz has agreed to follow-up  with our clinic in 2 weeks. She was informed of the importance of frequent follow-up visits to maximize her success with intensive lifestyle modifications for her multiple health conditions.   Objective:   Blood pressure 97/67, pulse 68, temperature 98 F (36.7 C), temperature source Oral, height 5\' 2"  (1.575 m), weight 206  lb (93.4 kg), SpO2 96 %. Body mass index is 37.68 kg/m.  General: Cooperative, alert, well developed, in no acute distress. HEENT: Conjunctivae and lids unremarkable. Cardiovascular: Regular rhythm.  Lungs: Normal work of breathing. Neurologic: No focal deficits.   Lab Results  Component Value Date   CREATININE 0.56 (L) 06/01/2019   BUN 15 06/01/2019   NA 142 06/01/2019   K 4.4 06/01/2019   CL 103 06/01/2019   CO2 25 06/01/2019   Lab Results  Component Value Date   ALT 33 (H) 06/01/2019   AST 32 06/01/2019   ALKPHOS 88 06/01/2019   BILITOT 0.2 06/01/2019   Lab Results  Component Value Date   HGBA1C 5.4 06/01/2019   HGBA1C 5.7 05/02/2018   HGBA1C 5.4 04/20/2016   HGBA1C 5.4 05/28/2015   HGBA1C 5.4 10/02/2014   Lab Results  Component Value Date   INSULIN 26.1 (H) 06/01/2019   Lab Results  Component Value Date   TSH 1.810 06/01/2019   Lab Results  Component Value Date   CHOL 210 (H) 06/01/2019   HDL 34 (L) 06/01/2019   LDLCALC 130 (H) 06/01/2019   LDLDIRECT 141.0 05/02/2018   TRIG 256 (H) 06/01/2019   CHOLHDL 8 05/02/2018   Lab Results  Component Value Date   WBC 6.9 06/01/2019   HGB 14.5 06/01/2019   HCT 43.1 06/01/2019   MCV 86 06/01/2019   PLT 175 06/01/2019   Lab Results  Component Value Date   FERRITIN 111 08/12/2012   Obesity Behavioral Intervention Documentation for Insurance:   Approximately 15 minutes were spent on the discussion below.  ASK: We discussed the diagnosis of obesity with Brittney Ruiz today and Brittney Ruiz agreed to give [name] permission to discuss obesity behavioral modification therapy today.  ASSESS: Brittney Ruiz has the diagnosis of obesity and her BMI today is 37.8. Brittney Ruiz is in the action stage of change.   ADVISE: Brittney Ruiz was educated on the multiple health risks of obesity as well as the benefit of weight loss to improve her health. She was advised of the need for long term treatment and the importance of lifestyle modifications to improve her  current health and to decrease her risk of future health problems.  AGREE: Multiple dietary modification options and treatment options were discussed and Brittney Ruiz agreed to follow the recommendations documented in the above note.  ARRANGE: Brittney Ruiz was educated on the importance of frequent visits to treat obesity as outlined per CMS and USPSTF guidelines and agreed to schedule her next follow up appointment today.  Attestation Statements:   Reviewed by clinician on day of visit: allergies, medications, problem list, medical history, surgical history, family history, social history, and previous encounter notes.  Time spent on visit including pre-visit chart review and post-visit care was 46 minutes.   I, Water quality scientist, CMA, am acting as Location manager for [employer], PA-C.  I have reviewed the above documentation for accuracy and completeness, and I agree with the above. Abby Potash, PA-C

## 2019-06-30 ENCOUNTER — Encounter: Payer: Self-pay | Admitting: Family

## 2019-07-03 ENCOUNTER — Ambulatory Visit: Payer: Medicare HMO

## 2019-07-03 ENCOUNTER — Other Ambulatory Visit: Payer: Self-pay

## 2019-07-03 ENCOUNTER — Encounter: Payer: Self-pay | Admitting: Family

## 2019-07-03 ENCOUNTER — Ambulatory Visit (INDEPENDENT_AMBULATORY_CARE_PROVIDER_SITE_OTHER): Payer: Medicare HMO | Admitting: Family

## 2019-07-03 DIAGNOSIS — J45909 Unspecified asthma, uncomplicated: Secondary | ICD-10-CM | POA: Diagnosis not present

## 2019-07-03 DIAGNOSIS — F419 Anxiety disorder, unspecified: Secondary | ICD-10-CM | POA: Diagnosis not present

## 2019-07-03 DIAGNOSIS — I340 Nonrheumatic mitral (valve) insufficiency: Secondary | ICD-10-CM | POA: Diagnosis not present

## 2019-07-03 DIAGNOSIS — I10 Essential (primary) hypertension: Secondary | ICD-10-CM

## 2019-07-03 DIAGNOSIS — C4371 Malignant melanoma of right lower limb, including hip: Secondary | ICD-10-CM | POA: Diagnosis not present

## 2019-07-03 DIAGNOSIS — Z6839 Body mass index (BMI) 39.0-39.9, adult: Secondary | ICD-10-CM

## 2019-07-03 DIAGNOSIS — J449 Chronic obstructive pulmonary disease, unspecified: Secondary | ICD-10-CM | POA: Diagnosis not present

## 2019-07-03 NOTE — Assessment & Plan Note (Signed)
Well-controlled, continue current regimen 

## 2019-07-03 NOTE — Progress Notes (Signed)
Virtual Visit via Video Note  I connected with@  on 07/03/19 at 11:00 AM EST by a video enabled telemedicine application and verified that I am speaking with the correct person using two identifiers.  Location patient: home Location provider:work Persons participating in the virtual visit: patient, provider  I discussed the limitations of evaluation and management by telemedicine and the availability of in person appointments. The patient expressed understanding and agreed to proceed.   HPI:  Feels well today HTN -  Compliant with propranolol . Today BP 114/85, 66. No cp.  Sob and back pain has improved as she has lost weight.  She does not want to see orthopedic at this time.  She would like to focus on weight loss  Following [facility], excited about the weight loss, to date 11 pounds.  Plans to start walking every day.   Asthma - well controlled currently. Hasnt had to use the albuterol in some time. No wheezing , congestion.   Follows with Dr Fletcher Anon. Will make a follow up when covid improves.  No longer seeing Dr Ashby Dawes and doesn't feel like she needs pulmonology at this time.   GAD- Follows with Charolett Bumpers for tranxene, zoloft.  Continues to follow with dermatology.   ROS: See pertinent positives and negatives per HPI.  Past Medical History:  Diagnosis Date  . Abnormal Pap smear of cervix    h/o LEEP  . Allergy   . Angio-edema   . Anxiety   . Asthma   . Bladder leak   . Cancer ([place])    melanoma 2003  . Chest pain   . Constipation   . COPD (chronic obstructive pulmonary disease) ([place])   . Depression   . Diarrhea   . Food allergy   . Heart valve problem   . Heartburn   . History of swelling of feet   . Hyperlipidemia   . Hypertension   . IBS (irritable bowel syndrome)   . Joint pain   . Low back pain   . Melanoma ([place])    right foot s/p lymph node removed right groin  . Obesity (BMI 30-39.9)   . Palpitation   . Prediabetes   .  Shortness of breath   . Urticaria   . Vaginal delivery    X 2    Past Surgical History:  Procedure Laterality Date  . BLADDER SUSPENSION  07/2017  . COLONOSCOPY WITH PROPOFOL N/A 07/01/2017   Procedure: COLONOSCOPY WITH PROPOFOL;  Surgeon: Virgel Manifold, MD;  Location: [facility] ENDOSCOPY;  Service: Endoscopy;  Laterality: N/A;  . ESOPHAGOGASTRODUODENOSCOPY (EGD) WITH PROPOFOL N/A 07/01/2017   Procedure: ESOPHAGOGASTRODUODENOSCOPY (EGD) WITH PROPOFOL;  Surgeon: Virgel Manifold, MD;  Location: [facility] ENDOSCOPY;  Service: Endoscopy;  Laterality: N/A;  . LEEP  1995  . LEEP    . PARTIAL HYSTERECTOMY  08/2017   s/p partial hysterectomy - per patient cervix, uterus, fallopian tubes; ovaries INTACT  . SKIN CANCER EXCISION     melanoma  . TONSILLECTOMY      Family History  Problem Relation Age of Onset  . Hyperlipidemia Mother   . Hypertension Mother   . Diabetes Mother   . Breast cancer Mother 42  . Stroke Mother   . Cancer Mother   . Cancer Father        lung  . Depression Father   . Diabetes Father   . Hypertension Father   . Hyperlipidemia Father   . Sleep apnea Father   .  Alcoholism Father   . Hyperlipidemia Sister   . Hypertension Sister   . Migraines Sister   . Hypertension Brother   . Hyperlipidemia Brother   . Migraines Brother   . Cancer Maternal Grandmother        brain  . Depression Maternal Grandfather   . Heart disease Maternal Grandfather   . Cancer Paternal Grandfather        unknown    SOCIAL HX: former smoker   Current Outpatient Medications:  .  ACCU-CHEK SOFTCLIX LANCETS lancets, TEST BLOOD SUGAR UP TO FOUR TIMES DAILY AS DIRECTED, Disp: 400 each, Rfl: 0 .  albuterol (PROVENTIL HFA;VENTOLIN HFA) 108 (90 Base) MCG/ACT inhaler, Inhale 2 puffs into the lungs every 6 (six) hours as needed., Disp: 1 Inhaler, Rfl: 1 .  albuterol (PROVENTIL) (2.5 MG/3ML) 0.083% nebulizer solution, Take 3 mLs (2.5 mg total) by nebulization every 6 (six) hours as needed  for wheezing or shortness of breath., Disp: 30 mL, Rfl: 3 .  aspirin EC 81 MG tablet, Take 81 mg by mouth daily., Disp: , Rfl:  .  B Complex Vitamins (BL VITAMIN B COMPLEX PO), Take 5,000 tablets by mouth daily. , Disp: , Rfl:  .  Blood Glucose Monitoring Suppl (ACCU-CHEK AVIVA PLUS) w/Device KIT, USE UP TO FOUR TIMES DAILY AS DIRECTED, Disp: 1 kit, Rfl: 0 .  clorazepate (TRANXENE) 7.5 MG tablet, Take 7.5 mg by mouth 3 (three) times daily., Disp: , Rfl:  .  fexofenadine (ALLEGRA) 60 MG tablet, Take 60 mg by mouth daily., Disp: , Rfl:  .  fluticasone (FLONASE) 50 MCG/ACT nasal spray, USE 2 SPRAYS INTO BOTH NOSTRILS DAILY., Disp: 48 g, Rfl: 2 .  glucose blood (ACCU-CHEK AVIVA PLUS) test strip, USE UP TO FOUR TIMES DAILY AS DIRECTED, Disp: 100 each, Rfl: 11 .  Multiple Vitamin (MULTIVITAMIN) tablet, Take 1 tablet by mouth daily., Disp: , Rfl:  .  Omega-3 Fatty Acids (FISH OIL) 1000 MG CAPS, Take 1 capsule by mouth daily., Disp: , Rfl:  .  omeprazole (PRILOSEC) 20 MG capsule, TAKE 1 CAPSULE EVERY DAY, Disp: 90 capsule, Rfl: 0 .  polyethylene glycol powder (MIRALAX) powder, Take 1 Container by mouth daily., Disp: , Rfl:  .  propranolol (INDERAL) 20 MG tablet, Take 1 tablet (20 mg total) by mouth 3 (three) times daily., Disp: 90 tablet, Rfl: 3 .  psyllium (METAMUCIL) 58.6 % powder, Take 1 packet by mouth daily., Disp: , Rfl:  .  sertraline (ZOLOFT) 100 MG tablet, TAKE 1+1/2 TABLETS BY MOUTH DAILY AS DIRECTED, Disp: 45 tablet, Rfl: 3 .  sertraline (ZOLOFT) 25 MG tablet, Take 25 mg by mouth daily. , Disp: , Rfl:  .  Spacer/Aero Chamber Mouthpiece MISC, 1 puff by Does not apply route as needed., Disp: 1 each, Rfl: 1 .  Vitamin D, Ergocalciferol, (DRISDOL) 1.25 MG (50000 UNIT) CAPS capsule, Take 1 capsule (50,000 Units total) by mouth every 7 (seven) days., Disp: 4 capsule, Rfl: 0 .  ACETAMINOPHEN PO, Take by mouth as needed.  , Disp: , Rfl:  .  brompheniramine-pseudoephedrine-dextromethorphan (DIMETAPP DM)  15-1-5 MG/5ML ELIX, Take 20 mLs by mouth every 8 (eight) hours as needed., Disp: , Rfl:  .  ibuprofen (ADVIL) 200 MG tablet, Take 200 mg by mouth every 6 (six) hours as needed., Disp: , Rfl:   EXAM:  VITALS per patient if applicable:  GENERAL: alert, oriented, appears well and in no acute distress  HEENT: atraumatic, conjunttiva clear, no obvious abnormalities on inspection of external  nose and ears  NECK: normal movements of the head and neck  LUNGS: on inspection no signs of respiratory distress, breathing rate appears normal, no obvious gross SOB, gasping or wheezing  CV: no obvious cyanosis  MS: moves all visible extremities without noticeable abnormality  PSYCH/NEURO: pleasant and cooperative, no obvious depression or anxiety, speech and thought processing grossly intact  ASSESSMENT AND PLAN:  Discussed the following assessment and plan:  Essential hypertension  Mitral valve insufficiency, unspecified etiology  Uncomplicated asthma, unspecified asthma severity, unspecified whether persistent  Anxiety  Class 2 severe obesity with serious comorbidity and body mass index (BMI) of 39.0 to 39.9 in adult, unspecified obesity type (HCC)  Malignant melanoma of right foot ([place]) Problem List Items Addressed This Visit      Cardiovascular and Mediastinum   HTN (hypertension)    Well controlled, continue current regimen.       Mitral valve regurgitation    Advised follow up with Dr Fletcher Anon which she understood.         Respiratory   Asthma    Stable at this time. Continue current regimen.         Other   Anxiety    At stable. Follows with Charolett Bumpers for tranxene, zoloft.      Class 2 severe obesity with serious comorbidity and body mass index (BMI) of 39.0 to 39.9 in adult [facility])    Actively loosing weight. Congratulated patient.       Malignant melanoma of foot ([place])      -we discussed possible serious and likely etiologies, options for evaluation and  workup, limitations of telemedicine visit vs in person visit, treatment, treatment risks and precautions. Pt prefers to treat via telemedicine empirically rather then risking or undertaking an in person visit at this moment. Patient agrees to seek prompt in person care if worsening, new symptoms arise, or if is not improving with treatment.   I discussed the assessment and treatment plan with the patient. The patient was provided an opportunity to ask questions and all were answered. The patient agreed with the plan and demonstrated an understanding of the instructions.   The patient was advised to call back or seek an in-person evaluation if the symptoms worsen or if the condition fails to improve as anticipated.   Mable Paris, FNP

## 2019-07-03 NOTE — Assessment & Plan Note (Signed)
Actively loosing weight. Congratulated patient.

## 2019-07-03 NOTE — Assessment & Plan Note (Signed)
At stable. Follows with Charolett Bumpers for tranxene, zoloft.

## 2019-07-03 NOTE — Assessment & Plan Note (Signed)
Advised follow up with Dr Fletcher Anon which she understood.

## 2019-07-03 NOTE — Assessment & Plan Note (Signed)
Stable at this time. Continue current regimen.

## 2019-07-07 ENCOUNTER — Other Ambulatory Visit: Payer: Self-pay

## 2019-07-07 ENCOUNTER — Encounter: Payer: Self-pay | Admitting: Family

## 2019-07-07 ENCOUNTER — Ambulatory Visit (INDEPENDENT_AMBULATORY_CARE_PROVIDER_SITE_OTHER): Payer: Medicare HMO

## 2019-07-07 VITALS — Ht 62.0 in | Wt 204.8 lb

## 2019-07-07 DIAGNOSIS — Z0000 Encounter for general adult medical examination without abnormal findings: Secondary | ICD-10-CM

## 2019-07-07 NOTE — Patient Instructions (Addendum)
  Brittney Ruiz , Thank you for taking time to come for your Medicare Wellness Visit. I appreciate your ongoing commitment to your health goals. Please review the following plan we discussed and let me know if I can assist you in the future.   These are the goals we discussed: Goals    . Increase physical activity     Walk on the treadmill as tolerated  Use exercise program "walk away the pounds" to encourage weight loss and over all health       This is a list of the screening recommended for you and due dates:  Health Maintenance  Topic Date Due  . Pap Smear  04/20/2020  . Mammogram  05/03/2021  . Colon Cancer Screening  07/01/2022  . Tetanus Vaccine  04/18/2023  . Flu Shot  Completed  . HIV Screening  Completed

## 2019-07-07 NOTE — Progress Notes (Addendum)
Subjective:   Brittney Ruiz is a 54 y.o. female who presents for Medicare Annual (Subsequent) preventive examination.  Review of Systems:  No ROS.  Medicare Wellness Virtual Visit.  Visual/audio telehealth visit, UTA vital signs.   Ht/Wt provided. See social history for additional risk factors.  Cardiac Risk Factors include: hypertension     Objective:     Vitals: Ht 5' 2"  (1.575 m)   Wt 204 lb 12.8 oz (92.9 kg)   LMP  (LMP Unknown) Comment: urine Preg negative   BMI 37.46 kg/m   Body mass index is 37.46 kg/m.  Advanced Directives 07/07/2019 05/01/2019 06/28/2018 06/13/2018 07/01/2017 06/25/2017 06/24/2016  Does Patient Have a Medical Advance Directive? No No No No No No No  Would patient like information on creating a medical advance directive? No - Patient declined - No - Patient declined - No - Patient declined No - Patient declined Yes (MAU/Ambulatory/Procedural Areas - Information given)    Tobacco Social History   Tobacco Use  Smoking Status Former Smoker  . Packs/day: 1.00  . Years: 20.00  . Pack years: 20.00  . Types: Cigarettes  . Quit date: 03/09/2008  . Years since quitting: 11.3  Smokeless Tobacco Never Used     Counseling given: Not Answered   Clinical Intake:  Pre-visit preparation completed: Yes        Diabetes: No  How often do you need to have someone help you when you read instructions, pamphlets, or other written materials from your doctor or pharmacy?: 1 - Never  Interpreter Needed?: No     Past Medical History:  Diagnosis Date  . Abnormal Pap smear of cervix    h/o LEEP  . Allergy   . Angio-edema   . Anxiety   . Asthma   . Bladder leak   . Cancer ([place])    melanoma 2003  . Chest pain   . Constipation   . COPD (chronic obstructive pulmonary disease) ([place])   . Depression   . Diarrhea   . Food allergy   . Heart valve problem   . Heartburn   . History of swelling of feet   . Hyperlipidemia   . Hypertension   . IBS (irritable  bowel syndrome)   . Joint pain   . Low back pain   . Melanoma ([place])    right foot s/p lymph node removed right groin  . Obesity (BMI 30-39.9)   . Palpitation   . Prediabetes   . Shortness of breath   . Urticaria   . Vaginal delivery    X 2   Past Surgical History:  Procedure Laterality Date  . BLADDER SUSPENSION  07/2017  . COLONOSCOPY WITH PROPOFOL N/A 07/01/2017   Procedure: COLONOSCOPY WITH PROPOFOL;  Surgeon: Virgel Manifold, MD;  Location: [facility] ENDOSCOPY;  Service: Endoscopy;  Laterality: N/A;  . ESOPHAGOGASTRODUODENOSCOPY (EGD) WITH PROPOFOL N/A 07/01/2017   Procedure: ESOPHAGOGASTRODUODENOSCOPY (EGD) WITH PROPOFOL;  Surgeon: Virgel Manifold, MD;  Location: [facility] ENDOSCOPY;  Service: Endoscopy;  Laterality: N/A;  . LEEP  1995  . LEEP    . PARTIAL HYSTERECTOMY  08/2017   s/p partial hysterectomy - per patient cervix, uterus, fallopian tubes; ovaries INTACT  . SKIN CANCER EXCISION     melanoma  . TONSILLECTOMY     Family History  Problem Relation Age of Onset  . Hyperlipidemia Mother   . Hypertension Mother   . Diabetes Mother   . Breast cancer Mother 30  . Stroke Mother   .  Cancer Mother   . Cancer Father        lung  . Depression Father   . Diabetes Father   . Hypertension Father   . Hyperlipidemia Father   . Sleep apnea Father   . Alcoholism Father   . Hyperlipidemia Sister   . Hypertension Sister   . Migraines Sister   . Hypertension Brother   . Hyperlipidemia Brother   . Migraines Brother   . Cancer Maternal Grandmother        brain  . Depression Maternal Grandfather   . Heart disease Maternal Grandfather   . Cancer Paternal Grandfather        unknown   Social History   Socioeconomic History  . Marital status: Married    Spouse name: Not on file  . Number of children: Not on file  . Years of education: Not on file  . Highest education level: Not on file  Occupational History  . Occupation: disability  Tobacco Use  . Smoking status:  Former Smoker    Packs/day: 1.00    Years: 20.00    Pack years: 20.00    Types: Cigarettes    Quit date: 03/09/2008    Years since quitting: 11.3  . Smokeless tobacco: Never Used  Substance and Sexual Activity  . Alcohol use: No  . Drug use: No  . Sexual activity: Yes  Other Topics Concern  . Not on file  Social History Narrative   Married 25 years.        2 children by 1st husband.       Disabled. Not working.    Right handed      One story home      Social Determinants of Health   Financial Resource Strain: Low Risk   . Difficulty of Paying Living Expenses: Not hard at all  Food Insecurity: No Food Insecurity  . Worried About Charity fundraiser in the Last Year: Never true  . Ran Out of Food in the Last Year: Never true  Transportation Needs: No Transportation Needs  . Lack of Transportation (Medical): No  . Lack of Transportation (Non-Medical): No  Physical Activity:   . Days of Exercise per Week: Not on file  . Minutes of Exercise per Session: Not on file  Stress:   . Feeling of Stress : Not on file  Social Connections: Not Isolated  . Frequency of Communication with Friends and Family: More than three times a week  . Frequency of Social Gatherings with Friends and Family: Not on file  . Attends Religious Services: 1 to 4 times per year  . Active Member of Clubs or Organizations: Yes  . Attends Archivist Meetings: Not on file  . Marital Status: Married    Outpatient Encounter Medications as of 07/07/2019  Medication Sig  . ACCU-CHEK SOFTCLIX LANCETS lancets TEST BLOOD SUGAR UP TO FOUR TIMES DAILY AS DIRECTED  . ACETAMINOPHEN PO Take by mouth as needed.    Marland Kitchen albuterol (PROVENTIL HFA;VENTOLIN HFA) 108 (90 Base) MCG/ACT inhaler Inhale 2 puffs into the lungs every 6 (six) hours as needed.  Marland Kitchen albuterol (PROVENTIL) (2.5 MG/3ML) 0.083% nebulizer solution Take 3 mLs (2.5 mg total) by nebulization every 6 (six) hours as needed for wheezing or shortness of  breath.  Marland Kitchen aspirin EC 81 MG tablet Take 81 mg by mouth daily.  . B Complex Vitamins (BL VITAMIN B COMPLEX PO) Take 5,000 tablets by mouth daily.   . Blood Glucose Monitoring  Suppl (ACCU-CHEK AVIVA PLUS) w/Device KIT USE UP TO FOUR TIMES DAILY AS DIRECTED  . brompheniramine-pseudoephedrine-dextromethorphan (DIMETAPP DM) 15-1-5 MG/5ML ELIX Take 20 mLs by mouth every 8 (eight) hours as needed.  . clorazepate (TRANXENE) 7.5 MG tablet Take 7.5 mg by mouth 3 (three) times daily.  . fexofenadine (ALLEGRA) 60 MG tablet Take 60 mg by mouth daily.  . fluticasone (FLONASE) 50 MCG/ACT nasal spray USE 2 SPRAYS INTO BOTH NOSTRILS DAILY.  Marland Kitchen glucose blood (ACCU-CHEK AVIVA PLUS) test strip USE UP TO FOUR TIMES DAILY AS DIRECTED  . ibuprofen (ADVIL) 200 MG tablet Take 200 mg by mouth every 6 (six) hours as needed.  . Multiple Vitamin (MULTIVITAMIN) tablet Take 1 tablet by mouth daily.  . Omega-3 Fatty Acids (FISH OIL) 1000 MG CAPS Take 1 capsule by mouth daily.  Marland Kitchen omeprazole (PRILOSEC) 20 MG capsule TAKE 1 CAPSULE EVERY DAY  . polyethylene glycol powder (MIRALAX) powder Take 1 Container by mouth daily.  . propranolol (INDERAL) 20 MG tablet Take 1 tablet (20 mg total) by mouth 3 (three) times daily.  . psyllium (METAMUCIL) 58.6 % powder Take 1 packet by mouth daily.  . sertraline (ZOLOFT) 100 MG tablet TAKE 1+1/2 TABLETS BY MOUTH DAILY AS DIRECTED  . sertraline (ZOLOFT) 25 MG tablet Take 25 mg by mouth daily.   Marland Kitchen Spacer/Aero Chamber Mouthpiece MISC 1 puff by Does not apply route as needed.  . Vitamin D, Ergocalciferol, (DRISDOL) 1.25 MG (50000 UNIT) CAPS capsule Take 1 capsule (50,000 Units total) by mouth every 7 (seven) days.   No facility-administered encounter medications on file as of 07/07/2019.    Activities of Daily Living In your present state of health, do you have any difficulty performing the following activities: 07/07/2019 07/03/2019  Hearing? N N  Vision? N N  Difficulty concentrating or making  decisions? N N  Walking or climbing stairs? N N  Dressing or bathing? N N  Doing errands, shopping? N N  Preparing Food and eating ? N -  Using the Toilet? N -  In the past six months, have you accidently leaked urine? N -  Do you have problems with loss of bowel control? N -  Managing your Medications? N -  Managing your Finances? N -  Housekeeping or managing your Housekeeping? N -  Some recent data might be hidden    Patient Care Team: Burnard Hawthorne, FNP as PCP - General (Family Medicine) Alda Berthold, DO as Consulting Physician (Neurology)    Assessment:   This is a routine wellness examination for Lurena.  Nurse connected with patient 07/07/19 at 11:00 AM EST by a telephone enabled telemedicine application and verified that I am speaking with the correct person using two identifiers. Patient stated full name and DOB. Patient gave permission to continue with virtual visit. Patient's location was at home and Nurse's location was at [place] office.   Patient is alert and oriented x3. Patient denies difficulty focusing or concentrating. Patient likes to crotchet, knit and do mission work for brain stimulation.   Health Maintenance Due: See completed HM at the end of note.   Eye: Visual acuity not assessed. Virtual visit. Followed by their ophthalmologist.  Dental: UTD   Hearing: Demonstrates normal hearing during visit.  Safety:  Patient feels safe at home- yes Patient does have smoke detectors at home- yes Patient does wear sunscreen or protective clothing when in direct sunlight - yes Patient does wear seat belt when in a moving vehicle - yes  Patient drives- yes Adequate lighting in walkways free from debris- yes Grab bars and handrails used as appropriate- yes Ambulates with an assistive device- no Cell phone on person when ambulating outside of the home- yes  Social: Alcohol intake - no      Smoking history- former   Smokers in home? none Illicit drug  use? none  Medication: Taking as directed and without issues.  Pill box in use -yes  Self managed - yes   Covid-19: Precautions and sickness symptoms discussed. Wears mask, social distancing, hand hygiene as appropriate.   Activities of Daily Living Patient denies needing assistance with: household chores, feeding themselves, getting from bed to chair, getting to the toilet, bathing/showering, dressing, managing money, or preparing meals.   Discussed the importance of a healthy diet, water intake and the benefits of aerobic exercise.  Physical activity- stretching, walking  Diet:  Category 3 prescribed meal plan through [facility], Dr. Briscoe Deutscher  Other Providers Patient Care Team: Burnard Hawthorne, FNP as PCP - General (Family Medicine) Alda Berthold, DO as Consulting Physician (Neurology)  Exercise Activities and Dietary recommendations Current Exercise Habits: Home exercise routine, Type of exercise: stretching;walking, Intensity: Mild  Goals    . Increase physical activity     Walk on the treadmill as tolerated  Use exercise program "walk away the pounds" to encourage weight loss and over all health       Fall Risk Fall Risk  07/07/2019 05/01/2019 02/10/2019 06/28/2018 06/25/2017  Falls in the past year? 0 0 0 0 No  Number falls in past yr: - 0 0 - -  Injury with Fall? - 0 - - -  Follow up Falls evaluation completed - - - -   Timed Get Up and Go performed: no, virtual visit  Depression Screen PHQ 2/9 Scores 07/07/2019 06/01/2019 02/10/2019 06/28/2018  PHQ - 2 Score - 6 0 2  PHQ- 9 Score - 23 - 5  Exception Documentation Other- indicate reason in comment box - - -  Not completed Managed by psychiatry every 3 months - - -     Cognitive Function MMSE - Mini Mental State Exam 06/25/2017  Orientation to time 5  Orientation to Place 5  Registration 3  Attention/ Calculation 5  Recall 3  Language- name 2 objects 2  Language- repeat 1  Language- follow 3  step command 3  Language- read & follow direction 1  Write a sentence 1  Copy design 1  Total score 30     6CIT Screen 07/07/2019 06/28/2018 06/24/2016  What Year? 0 points 0 points 0 points  What month? 0 points 0 points 0 points  What time? 0 points 0 points 0 points  Count back from 20 0 points 0 points 0 points  Months in reverse 0 points 0 points 0 points  Repeat phrase 0 points 0 points 0 points  Total Score 0 0 0    Immunization History  Administered Date(s) Administered  . Influenza Inj Mdck Quad Pf 03/22/2019  . Influenza Split 04/17/2013  . Influenza Whole 05/18/2008, 05/29/2011  . Influenza-Unspecified 03/19/2015, 02/16/2016, 03/05/2017  . Pneumococcal Polysaccharide-23 05/02/2018  . Td 05/18/1997  . Tdap 07/16/2009, 04/17/2013   Screening Tests Health Maintenance  Topic Date Due  . PAP SMEAR-Modifier  04/20/2020  . MAMMOGRAM  05/03/2021  . COLONOSCOPY  07/01/2022  . TETANUS/TDAP  04/18/2023  . INFLUENZA VACCINE  Completed  . HIV Screening  Completed      Plan:  Keep all routine maintenance appointments.   Follow up 10/02/19  Medicare Attestation I have personally reviewed: The patient's medical and social history Their use of alcohol, tobacco or illicit drugs Their current medications and supplements The patient's functional ability including ADLs,fall risks, home safety risks, cognitive, and hearing and visual impairment Diet and physical activities Evidence for depression   I have reviewed and discussed with patient certain preventive protocols, quality metrics, and best practice recommendations.     Varney Biles, LPN  2/96/7000  Agree with plan. Mable Paris, NP

## 2019-07-11 ENCOUNTER — Other Ambulatory Visit: Payer: Self-pay

## 2019-07-11 ENCOUNTER — Encounter (INDEPENDENT_AMBULATORY_CARE_PROVIDER_SITE_OTHER): Payer: Self-pay | Admitting: Physician Assistant

## 2019-07-11 ENCOUNTER — Ambulatory Visit (INDEPENDENT_AMBULATORY_CARE_PROVIDER_SITE_OTHER): Payer: Medicare HMO | Admitting: Physician Assistant

## 2019-07-11 VITALS — BP 108/73 | HR 63 | Temp 98.4°F | Ht 62.0 in | Wt 203.0 lb

## 2019-07-11 DIAGNOSIS — E7849 Other hyperlipidemia: Secondary | ICD-10-CM | POA: Diagnosis not present

## 2019-07-11 DIAGNOSIS — Z6837 Body mass index (BMI) 37.0-37.9, adult: Secondary | ICD-10-CM

## 2019-07-11 DIAGNOSIS — E559 Vitamin D deficiency, unspecified: Secondary | ICD-10-CM | POA: Diagnosis not present

## 2019-07-11 NOTE — Progress Notes (Signed)
Chief Complaint:   OBESITY Brittney Ruiz is here to discuss her progress with her obesity treatment plan along with follow-up of her obesity related diagnoses. Brittney Ruiz is on the Category 3 Plan and states she is following her eating plan approximately 99% of the time. Brittney Ruiz states she is walking for 30 minutes 5 times per week.  Today's visit was #: 4 Starting weight: 217 lbs Starting date: 06/01/2019 Today's weight: 203 lbs Today's date: 07/11/2019 Total lbs lost to date: 14 Total lbs lost since last in-office visit: 3  Interim History: Brittney Ruiz is doing a very good job with staying on the plan and exercising the past couple of weeks.  Subjective:   1. Other hyperlipidemia Brittney Ruiz is on fish oil and she denies chest pain. Last level was not at goal. She is exercising regularly.  2. Vitamin D deficiency Brittney Ruiz's last Vit D level was 28.7. She is on weekly Vit D, and she denies nausea, vomiting, or muscle weakness.  Assessment/Plan:   1. Other hyperlipidemia Cardiovascular risk and specific lipid/LDL goals reviewed. We discussed several lifestyle modifications today and Brittney Ruiz will continue with fish oil and exercise. Orders and follow up as documented in patient record.   2. Vitamin D deficiency Low Vitamin D level contributes to fatigue and are associated with obesity, breast, and colon cancer. Brittney Ruiz agreed to continue taking prescription Vitamin D 50,000 IU every week and will follow-up for routine testing of Vitamin D, at least 2-3 times per year to avoid over-replacement.  3. Class 2 severe obesity with serious comorbidity and body mass index (BMI) of 37.0 to 37.9 in adult, unspecified obesity type Brittney Ruiz) Brittney Ruiz is currently in the action stage of change. As such, her goal is to continue with weight loss efforts. She has agreed to the Category 3 Plan.   Exercise goals: Brittney Ruiz is to continue her current exercise regimen as is.  Behavioral modification strategies: meal planning and cooking strategies  and planning for success.  Brittney Ruiz has agreed to follow-up with our clinic in 2 to 3 weeks. She was informed of the importance of frequent follow-up visits to maximize her success with intensive lifestyle modifications for her multiple health conditions.   Objective:   Blood pressure 108/73, pulse 63, temperature 98.4 F (36.9 C), temperature source Oral, height 5\' 2"  (1.575 m), weight 203 lb (92.1 kg), SpO2 96 %. Body mass index is 37.13 kg/m.  General: Cooperative, alert, well developed, in no acute distress. HEENT: Conjunctivae and lids unremarkable. Cardiovascular: Regular rhythm.  Lungs: Normal work of breathing. Neurologic: No focal deficits.   Lab Results  Component Value Date   CREATININE 0.56 (L) 06/01/2019   BUN 15 06/01/2019   NA 142 06/01/2019   K 4.4 06/01/2019   CL 103 06/01/2019   CO2 25 06/01/2019   Lab Results  Component Value Date   ALT 33 (H) 06/01/2019   AST 32 06/01/2019   ALKPHOS 88 06/01/2019   BILITOT 0.2 06/01/2019   Lab Results  Component Value Date   HGBA1C 5.4 06/01/2019   HGBA1C 5.7 05/02/2018   HGBA1C 5.4 04/20/2016   HGBA1C 5.4 05/28/2015   HGBA1C 5.4 10/02/2014   Lab Results  Component Value Date   INSULIN 26.1 (H) 06/01/2019   Lab Results  Component Value Date   TSH 1.810 06/01/2019   Lab Results  Component Value Date   CHOL 210 (H) 06/01/2019   HDL 34 (L) 06/01/2019   LDLCALC 130 (H) 06/01/2019   LDLDIRECT 141.0 05/02/2018  TRIG 256 (H) 06/01/2019   CHOLHDL 8 05/02/2018   Lab Results  Component Value Date   WBC 6.9 06/01/2019   HGB 14.5 06/01/2019   HCT 43.1 06/01/2019   MCV 86 06/01/2019   PLT 175 06/01/2019   Lab Results  Component Value Date   FERRITIN 111 08/12/2012    Obesity Behavioral Intervention Documentation for Insurance:   Approximately 15 minutes were spent on the discussion below.  ASK: We discussed the diagnosis of obesity with Brittney Ruiz today and Brittney Ruiz agreed to give [name] permission to discuss obesity  behavioral modification therapy today.  ASSESS: Brittney Ruiz has the diagnosis of obesity and her BMI today is 37.12. Brittney Ruiz is in the action stage of change.   ADVISE: Brittney Ruiz was educated on the multiple health risks of obesity as well as the benefit of weight loss to improve her health. She was advised of the need for long term treatment and the importance of lifestyle modifications to improve her current health and to decrease her risk of future health problems.  AGREE: Multiple dietary modification options and treatment options were discussed and Brittney Ruiz agreed to follow the recommendations documented in the above note.  ARRANGE: Brittney Ruiz was educated on the importance of frequent visits to treat obesity as outlined per CMS and USPSTF guidelines and agreed to schedule her next follow up appointment today.  Attestation Statements:   Reviewed by clinician on day of visit: allergies, medications, problem list, medical history, surgical history, family history, social history, and previous encounter notes.   Wilhemena Durie, am acting as transcriptionist for [employer], PA-C.  I have reviewed the above documentation for accuracy and completeness, and I agree with the above. Abby Potash, PA-C

## 2019-07-25 ENCOUNTER — Ambulatory Visit (INDEPENDENT_AMBULATORY_CARE_PROVIDER_SITE_OTHER): Payer: Medicare HMO | Admitting: Physician Assistant

## 2019-07-25 ENCOUNTER — Encounter (INDEPENDENT_AMBULATORY_CARE_PROVIDER_SITE_OTHER): Payer: Self-pay | Admitting: Physician Assistant

## 2019-07-25 ENCOUNTER — Other Ambulatory Visit: Payer: Self-pay

## 2019-07-25 VITALS — BP 118/76 | HR 62 | Temp 98.0°F | Ht 62.0 in | Wt 202.0 lb

## 2019-07-25 DIAGNOSIS — E559 Vitamin D deficiency, unspecified: Secondary | ICD-10-CM | POA: Diagnosis not present

## 2019-07-25 DIAGNOSIS — E7849 Other hyperlipidemia: Secondary | ICD-10-CM | POA: Diagnosis not present

## 2019-07-25 DIAGNOSIS — Z6836 Body mass index (BMI) 36.0-36.9, adult: Secondary | ICD-10-CM

## 2019-07-25 MED ORDER — VITAMIN D (ERGOCALCIFEROL) 1.25 MG (50000 UNIT) PO CAPS: 50000.0000 [IU] | ORAL_CAPSULE | ORAL | 0 refills | Status: DC

## 2019-07-25 NOTE — Progress Notes (Signed)
Chief Complaint:   OBESITY Brittney Ruiz is here to discuss her progress with her obesity treatment plan along with follow-up of her obesity related diagnoses. Brittney Ruiz is on the Category 3 Plan and states she is following her eating plan approximately 95% of the time. Brittney Ruiz states she is exercising 0 minutes 0 times per week.  Today's visit was #: 5 Starting weight: 217 lbs Starting date: 06/01/2019 Today's weight: 202 lbs Today's date: 07/25/2019 Total lbs lost to date: 15 Total lbs lost since last in-office visit: 1  Interim History: Brittney Ruiz reports that she had a couple of days where she skipped lunch. Otherwise, she has done well on the plan.  Subjective:   Vitamin D deficiency. Brittney Ruiz is on Vitamin D weekly. No nausea, vomiting, or muscle weakness. Last Vitamin D 28.7 on 06/01/2019.  Other hyperlipidemia. Brittney Ruiz is on no medications. Last lipid panel was at goal. She is not exercising.  Lab Results  Component Value Date   CHOL 210 (H) 06/01/2019   HDL 34 (L) 06/01/2019   LDLCALC 130 (H) 06/01/2019   LDLDIRECT 141.0 05/02/2018   TRIG 256 (H) 06/01/2019   CHOLHDL 8 05/02/2018   Lab Results  Component Value Date   ALT 33 (H) 06/01/2019   AST 32 06/01/2019   ALKPHOS 88 06/01/2019   BILITOT 0.2 06/01/2019   The 10-year ASCVD risk score [name], et al., 2013) is: 3.1%   Values used to calculate the score:     Age: 23 years     Sex: Female     Is Non-Hispanic African American: No     Diabetic: No     Tobacco smoker: No     Systolic Blood Pressure: 123456 mmHg     Is BP treated: Yes     HDL Cholesterol: 34 mg/dL     Total Cholesterol: 210 mg/dL  Assessment/Plan:   Vitamin D deficiency. Low Vitamin D level contributes to fatigue and are associated with obesity, breast, and colon cancer. She was given a refill on her Vitamin D, Ergocalciferol, (DRISDOL) 1.25 MG (50000 UNIT) CAPS capsule every week #4 with 0 refills and will follow-up for routine testing of Vitamin D, at  least 2-3 times per year to avoid over-replacement.     Other hyperlipidemia. Cardiovascular risk and specific lipid/LDL goals reviewed.  We discussed several lifestyle modifications today and Meriyah will continue to work on diet, exercise and weight loss efforts. Orders and follow up as documented in patient record.   Counseling Intensive lifestyle modifications are the first line treatment for this issue.  Dietary changes: Increase soluble fiber. Decrease simple carbohydrates.  Exercise changes: Moderate to vigorous-intensity aerobic activity 150 minutes per week if tolerated.  Lipid-lowering medications: see documented in medical record.  Class 2 severe obesity with serious comorbidity and body mass index (BMI) of 36.0 to 36.9 in adult, unspecified obesity type ([place]).  Brittney Ruiz is currently in the action stage of change. As such, her goal is to continue with weight loss efforts. She has agreed to the Category 3 Plan.   Exercise goals: For substantial health benefits, adults should do at least 150 minutes (2 hours and 30 minutes) a week of moderate-intensity, or 75 minutes (1 hour and 15 minutes) a week of vigorous-intensity aerobic physical activity, or an equivalent combination of moderate- and vigorous-intensity aerobic activity. Aerobic activity should be performed in episodes of at least 10 minutes, and preferably, it should be spread throughout the week.  Behavioral modification  strategies: no skipping meals and meal planning and cooking strategies.  Brittney Ruiz has agreed to follow-up with our clinic in 2 weeks. She was informed of the importance of frequent follow-up visits to maximize her success with intensive lifestyle modifications for her multiple health conditions.   Objective:   Blood pressure 118/76, pulse 62, temperature 98 F (36.7 C), temperature source Oral, height 5\' 2"  (1.575 m), weight 202 lb (91.6 kg), SpO2 97 %. Body mass index is 36.95 kg/m.  General: Cooperative, alert,  well developed, in no acute distress. HEENT: Conjunctivae and lids unremarkable. Cardiovascular: Regular rhythm.  Lungs: Normal work of breathing. Neurologic: No focal deficits.   Lab Results  Component Value Date   CREATININE 0.56 (L) 06/01/2019   BUN 15 06/01/2019   NA 142 06/01/2019   K 4.4 06/01/2019   CL 103 06/01/2019   CO2 25 06/01/2019   Lab Results  Component Value Date   ALT 33 (H) 06/01/2019   AST 32 06/01/2019   ALKPHOS 88 06/01/2019   BILITOT 0.2 06/01/2019   Lab Results  Component Value Date   HGBA1C 5.4 06/01/2019   HGBA1C 5.7 05/02/2018   HGBA1C 5.4 04/20/2016   HGBA1C 5.4 05/28/2015   HGBA1C 5.4 10/02/2014   Lab Results  Component Value Date   INSULIN 26.1 (H) 06/01/2019   Lab Results  Component Value Date   TSH 1.810 06/01/2019   Lab Results  Component Value Date   CHOL 210 (H) 06/01/2019   HDL 34 (L) 06/01/2019   LDLCALC 130 (H) 06/01/2019   LDLDIRECT 141.0 05/02/2018   TRIG 256 (H) 06/01/2019   CHOLHDL 8 05/02/2018   Lab Results  Component Value Date   WBC 6.9 06/01/2019   HGB 14.5 06/01/2019   HCT 43.1 06/01/2019   MCV 86 06/01/2019   PLT 175 06/01/2019   Lab Results  Component Value Date   FERRITIN 111 08/12/2012    Obesity Behavioral Intervention Documentation for Insurance:   Approximately 15 minutes were spent on the discussion below.  ASK: We discussed the diagnosis of obesity with Brittney Ruiz today and Brittney Ruiz agreed to give Brittney Ruiz permission to discuss obesity behavioral modification therapy today.  ASSESS: Brittney Ruiz has the diagnosis of obesity and her BMI today is 36.9. Brittney Ruiz is in the action stage of change.   ADVISE: Brittney Ruiz was educated on the multiple health risks of obesity as well as the benefit of weight loss to improve her health. She was advised of the need for long term treatment and the importance of lifestyle modifications to improve her current health and to decrease her risk of future health problems.  AGREE: Multiple  dietary modification options and treatment options were discussed and Brittney Ruiz agreed to follow the recommendations documented in the above note.  ARRANGE: Brittney Ruiz was educated on the importance of frequent visits to treat obesity as outlined per CMS and USPSTF guidelines and agreed to schedule her next follow up appointment today.  Attestation Statements:   Reviewed by clinician on day of visit: allergies, medications, problem list, medical history, surgical history, family history, social history, and previous encounter notes.  IMichaelene Song, am acting as transcriptionist for Abby Potash, PA-C   I have reviewed the above documentation for accuracy and completeness, and I agree with the above. Abby Potash, PA-C

## 2019-08-07 ENCOUNTER — Other Ambulatory Visit: Payer: Self-pay

## 2019-08-08 ENCOUNTER — Ambulatory Visit (INDEPENDENT_AMBULATORY_CARE_PROVIDER_SITE_OTHER): Payer: Medicare HMO | Admitting: Family

## 2019-08-08 ENCOUNTER — Other Ambulatory Visit (HOSPITAL_COMMUNITY)
Admission: RE | Admit: 2019-08-08 | Discharge: 2019-08-08 | Disposition: A | Discharge status: Home / Self Care | Payer: Medicare HMO | Source: Ambulatory Visit | Attending: Family | Admitting: Family

## 2019-08-08 ENCOUNTER — Encounter: Payer: Self-pay | Admitting: Family

## 2019-08-08 VITALS — BP 116/76 | HR 69 | Temp 96.1°F | Resp 16 | Ht 62.0 in | Wt 200.8 lb

## 2019-08-08 DIAGNOSIS — Z124 Encounter for screening for malignant neoplasm of cervix: Secondary | ICD-10-CM | POA: Diagnosis not present

## 2019-08-08 DIAGNOSIS — Z1151 Encounter for screening for human papillomavirus (HPV): Secondary | ICD-10-CM | POA: Diagnosis not present

## 2019-08-08 DIAGNOSIS — Z0000 Encounter for general adult medical examination without abnormal findings: Secondary | ICD-10-CM

## 2019-08-08 DIAGNOSIS — E782 Mixed hyperlipidemia: Secondary | ICD-10-CM

## 2019-08-08 DIAGNOSIS — I10 Essential (primary) hypertension: Secondary | ICD-10-CM | POA: Diagnosis not present

## 2019-08-08 NOTE — Assessment & Plan Note (Signed)
Stable, continue propranolol

## 2019-08-08 NOTE — Progress Notes (Signed)
Subjective:    Patient ID: Brittney Ruiz, female    DOB: Oct 05, 1965, 54 y.o.   MRN: [id number]  CC: Brittney Ruiz is a 54 y.o. female who presents today for physical exam.    HPI: Feels well today No complaints today Plans to resume church service. Excited to start back at [employer].   Following with [facility] and feels great. Actively losing weight.   HLD- declines medication at this time as wants to repeat cholesterol with [facility]. Taken '1/8' of zetia one day per week  and continues to feel muscle aches. NO other days has muscle aches.   HTN- compliant with pronpranprolol. Denies exertional chest pain or pressure, numbness or tingling radiating to left arm or jaw, palpitations, dizziness, frequent headaches, changes in vision, or shortness of breath.   Follows with psychiatry and feels well on current regimen.  She continue to follow with dermatology   Colorectal Cancer Screening: UTD ; repeat in 5 years.  Breast Cancer Screening: Mammogram UTD Cervical Cancer Screening: due; partial hysterectomy. Bone Health screening/DEXA for 65+: No increased fracture risk. Defer screening at this time. Lung Cancer Screening: Doesn't have 30 year pack year history and age > 1 years.       Tetanus - utd         Labs: Screening labs done with [facility].  Exercise: Gets regular exercise.  Alcohol use: none Smoking/tobacco use: former smoker.    HISTORY:  Past Medical History:  Diagnosis Date  . Abnormal Pap smear of cervix    h/o LEEP  . Allergy   . Angio-edema   . Anxiety   . Asthma   . Bladder leak   . Cancer ([place])    melanoma 2003  . Chest pain   . Constipation   . COPD (chronic obstructive pulmonary disease) ([place])   . Depression   . Diarrhea   . Food allergy   . Heart valve problem   . Heartburn   . History of swelling of feet   . Hyperlipidemia   . Hypertension   . IBS (irritable bowel syndrome)   . Joint pain   . Low back pain   . Melanoma  ([place])    right foot s/p lymph node removed right groin  . Obesity (BMI 30-39.9)   . Palpitation   . Prediabetes   . Shortness of breath   . Urticaria   . Vaginal delivery    X 2    Past Surgical History:  Procedure Laterality Date  . BLADDER SUSPENSION  07/2017  . COLONOSCOPY WITH PROPOFOL N/A 07/01/2017   Procedure: COLONOSCOPY WITH PROPOFOL;  Surgeon: Virgel Manifold, MD;  Location: [facility] ENDOSCOPY;  Service: Endoscopy;  Laterality: N/A;  . ESOPHAGOGASTRODUODENOSCOPY (EGD) WITH PROPOFOL N/A 07/01/2017   Procedure: ESOPHAGOGASTRODUODENOSCOPY (EGD) WITH PROPOFOL;  Surgeon: Virgel Manifold, MD;  Location: [facility] ENDOSCOPY;  Service: Endoscopy;  Laterality: N/A;  . LEEP  1995  . LEEP    . PARTIAL HYSTERECTOMY  08/2017   s/p partial hysterectomy - per patient HAS cervix, uterus, fallopian tubes; ovaries INTACT  . SKIN CANCER EXCISION     melanoma  . TONSILLECTOMY     Family History  Problem Relation Age of Onset  . Hyperlipidemia Mother   . Hypertension Mother   . Diabetes Mother   . Breast cancer Mother 40  . Stroke Mother   . Cancer Mother   . Cancer Father  lung  . Depression Father   . Diabetes Father   . Hypertension Father   . Hyperlipidemia Father   . Sleep apnea Father   . Alcoholism Father   . Hyperlipidemia Sister   . Hypertension Sister   . Migraines Sister   . Hypertension Brother   . Hyperlipidemia Brother   . Migraines Brother   . Cancer Maternal Grandmother        brain  . Depression Maternal Grandfather   . Heart disease Maternal Grandfather   . Cancer Paternal Grandfather        unknown      ALLERGIES: Pork allergy, Pork-derived products, Sulfur, Aciphex [rabeprazole sodium], Antivert [meclizine hcl], Buspar [buspirone hcl], Cat hair extract, Chlorpheniramine-pseudoeph, Chlorpheniramine-pseudoeph, Chocolate flavor, Codeine, Crestor [rosuvastatin calcium], Cymbalta [duloxetine hcl], Duloxetine, Effexor xr [venlafaxine hydrochloride],  Epinephrine, Erythromycin, Escitalopram oxalate, Lamictal [lamotrigine], Levofloxacin, Lipitor [atorvastatin], Meclizine, Mometasone furoate, Mometasone furoate, Monosodium glutamate, Neurontin [gabapentin], Other, Prednisone, Shellfish allergy, Sudafed [pseudoephedrine hcl], Sulfa antibiotics, Sulfasalazine, Valium, Wellbutrin [bupropion hcl], [place]ixela inhub [fluticasone-salmeterol], Xanax xr [alprazolam], Alfalfa, Brompheniramine-phenylephrine, Buspirone, and Tape  Current Outpatient Medications on File Prior to Visit  Medication Sig Dispense Refill  . ACCU-CHEK SOFTCLIX LANCETS lancets TEST BLOOD SUGAR UP TO FOUR TIMES DAILY AS DIRECTED 400 each 0  . albuterol (PROVENTIL HFA;VENTOLIN HFA) 108 (90 Base) MCG/ACT inhaler Inhale 2 puffs into the lungs every 6 (six) hours as needed. 1 Inhaler 1  . albuterol (PROVENTIL) (2.5 MG/3ML) 0.083% nebulizer solution Take 3 mLs (2.5 mg total) by nebulization every 6 (six) hours as needed for wheezing or shortness of breath. 30 mL 3  . aspirin EC 81 MG tablet Take 81 mg by mouth daily.    . B Complex Vitamins (BL VITAMIN B COMPLEX PO) Take 5,000 tablets by mouth daily.     . Blood Glucose Monitoring Suppl (ACCU-CHEK AVIVA PLUS) w/Device KIT USE UP TO FOUR TIMES DAILY AS DIRECTED 1 kit 0  . clorazepate (TRANXENE) 7.5 MG tablet Take 7.5 mg by mouth 3 (three) times daily.    . fexofenadine (ALLEGRA) 60 MG tablet Take 60 mg by mouth daily.    . fluticasone (FLONASE) 50 MCG/ACT nasal spray USE 2 SPRAYS INTO BOTH NOSTRILS DAILY. 48 g 2  . glucose blood (ACCU-CHEK AVIVA PLUS) test strip USE UP TO FOUR TIMES DAILY AS DIRECTED 100 each 11  . Multiple Vitamin (MULTIVITAMIN) tablet Take 1 tablet by mouth daily.    . Omega-3 Fatty Acids (FISH OIL) 1000 MG CAPS Take 1 capsule by mouth daily.    Marland Kitchen omeprazole (PRILOSEC) 20 MG capsule TAKE 1 CAPSULE EVERY DAY 90 capsule 0  . polyethylene glycol powder (MIRALAX) powder Take 1 Container by mouth daily.    . propranolol (INDERAL)  20 MG tablet Take 1 tablet (20 mg total) by mouth 3 (three) times daily. 90 tablet 3  . psyllium (METAMUCIL) 58.6 % powder Take 1 packet by mouth daily.    . sertraline (ZOLOFT) 100 MG tablet TAKE 1+1/2 TABLETS BY MOUTH DAILY AS DIRECTED 45 tablet 3  . sertraline (ZOLOFT) 25 MG tablet Take 25 mg by mouth daily.     Marland Kitchen Spacer/Aero Chamber Mouthpiece MISC 1 puff by Does not apply route as needed. 1 each 1  . Vitamin D, Ergocalciferol, (DRISDOL) 1.25 MG (50000 UNIT) CAPS capsule Take 1 capsule (50,000 Units total) by mouth every 7 (seven) days. 4 capsule 0  . ACETAMINOPHEN PO Take by mouth as needed.      Marland Kitchen ibuprofen (ADVIL) 200 MG  tablet Take 200 mg by mouth every 6 (six) hours as needed.     No current facility-administered medications on file prior to visit.    Social History   Tobacco Use  . Smoking status: Former Smoker    Packs/day: 1.00    Years: 20.00    Pack years: 20.00    Types: Cigarettes    Quit date: 03/09/2008    Years since quitting: 11.4  . Smokeless tobacco: Never Used  Substance Use Topics  . Alcohol use: No  . Drug use: No    Review of Systems  Constitutional: Negative for chills, fever and unexpected weight change.  HENT: Negative for congestion.   Respiratory: Negative for cough.   Cardiovascular: Negative for chest pain, palpitations and leg swelling.  Gastrointestinal: Negative for nausea and vomiting.  Musculoskeletal: Negative for arthralgias and myalgias.  Skin: Negative for rash.  Neurological: Negative for headaches.  Hematological: Negative for adenopathy.  Psychiatric/Behavioral: Negative for confusion.      Objective:    BP 116/76 (BP Location: Right Arm, Patient Position: Sitting, Cuff Size: Normal)   Pulse 69   Temp (!) 96.1 F (35.6 C) (Temporal)   Resp 16   Ht 5' 2" (1.575 m)   Wt 200 lb 12.8 oz (91.1 kg)   LMP  (LMP Unknown) Comment: urine Preg negative   SpO2 97%   BMI 36.73 kg/m   BP Readings from Last 3 Encounters:  08/08/19  116/76  07/25/19 118/76  07/11/19 108/73   Wt Readings from Last 3 Encounters:  08/08/19 200 lb 12.8 oz (91.1 kg)  07/25/19 202 lb (91.6 kg)  07/11/19 203 lb (92.1 kg)    Physical Exam Vitals reviewed.  Constitutional:      Appearance: She is well-developed.  Eyes:     Conjunctiva/sclera: Conjunctivae normal.  Neck:     Thyroid: No thyroid mass or thyromegaly.  Cardiovascular:     Rate and Rhythm: Normal rate and regular rhythm.     Pulses: Normal pulses.     Heart sounds: Normal heart sounds.  Pulmonary:     Effort: Pulmonary effort is normal.     Breath sounds: Normal breath sounds. No wheezing, rhonchi or rales.  Chest:     Breasts: Breasts are symmetrical.        Right: No inverted nipple, mass, nipple discharge, skin change or tenderness.        Left: No inverted nipple, mass, nipple discharge, skin change or tenderness.  Genitourinary:    Cervix: No cervical motion tenderness, discharge or friability.     Uterus: Not enlarged, not fixed and not tender.      Adnexa:        Right: No mass, tenderness or fullness.         Left: No mass, tenderness or fullness.       Comments: Pap performed.  No adnexal mass appreciated with bimanual exam Lymphadenopathy:     Head:     Right side of head: No submental, submandibular, tonsillar, preauricular, posterior auricular or occipital adenopathy.     Left side of head: No submental, submandibular, tonsillar, preauricular, posterior auricular or occipital adenopathy.     Cervical:     Right cervical: No superficial, deep or posterior cervical adenopathy.    Left cervical: No superficial, deep or posterior cervical adenopathy.     Upper Body:     Right upper body: No pectoral adenopathy.     Left upper body: No pectoral adenopathy.  Skin:  General: Skin is warm and dry.  Neurological:     Mental Status: She is alert.  Psychiatric:        Speech: Speech normal.        Behavior: Behavior normal.        Thought Content:  Thought content normal.        Assessment & Plan:   Problem List Items Addressed This Visit      Cardiovascular and Mediastinum   HTN (hypertension)    Stable, continue propranolol        Other   HLD (hyperlipidemia)    Continue to be elevated, though improved TGs as of 06/01/19.  Is currently on Zetia 1/8 tablet once per week. Unsure how effective this low level will be.  She declined augmentation of regimen today as she would like to wait repeat cholesterol check as she is actively losing weight. She will follow up with me after labs done with [facility].       Routine physical examination    Clinical breast exam and Pap smear performed today.  Deferred screening labs as have been done by [facility]       Other Visit Diagnoses    Cervical cancer screening    -  Primary   Relevant Orders   Cytology - PAP( [place])       I have discontinued Brigitta Pricer. Fread's brompheniramine-pseudoephedrine-dextromethorphan. I am also having her maintain her Spacer/Aero Chamber Mouthpiece, ACETAMINOPHEN PO, clorazepate, multivitamin, sertraline, propranolol, albuterol, psyllium, B Complex Vitamins (BL VITAMIN B COMPLEX PO), aspirin EC, Fish Oil, polyethylene glycol powder, albuterol, Accu-Chek Aviva Plus, Accu-Chek Softclix Lancets, sertraline, fluticasone, Accu-Chek Aviva Plus, omeprazole, ibuprofen, fexofenadine, and Vitamin D (Ergocalciferol).   No orders of the defined types were placed in this encounter.   Return precautions given.   Risks, benefits, and alternatives of the medications and treatment plan prescribed today were discussed, and patient expressed understanding.   Education regarding symptom management and diagnosis given to patient on AVS.   Continue to follow with Burnard Hawthorne, FNP for routine health maintenance.   Aggie Hacker and I agreed with plan.   Mable Paris, FNP

## 2019-08-08 NOTE — Assessment & Plan Note (Signed)
Clinical breast exam and Pap smear performed today.  Deferred screening labs as have been done by [facility]

## 2019-08-08 NOTE — Assessment & Plan Note (Signed)
Continue to be elevated, though improved TGs as of 06/01/19.  Is currently on Zetia 1/8 tablet once per week. Unsure how effective this low level will be.  She declined augmentation of regimen today as she would like to wait repeat cholesterol check as she is actively losing weight. She will follow up with me after labs done with [facility].

## 2019-08-08 NOTE — Patient Instructions (Addendum)
Vitamin D is slightly low. Please start cholecalciferol 800 units daily for next 3 to 4 months. You may find this over the counter in the drug store. Please call to make an appointment for 3 to 4 months out so we can recheck. Also, please ensure you are following a diet high in calcium -- research shows better outcomes with dietary sources including kale, yogurt, broccolii, cheese, okra, almonds- to name a few.    Health Maintenance, Female Adopting a healthy lifestyle and getting preventive care are important in promoting health and wellness. Ask your health care provider about:  The right schedule for you to have regular tests and exams.  Things you can do on your own to prevent diseases and keep yourself healthy. What should I know about diet, weight, and exercise? Eat a healthy diet   Eat a diet that includes plenty of vegetables, fruits, low-fat dairy products, and lean protein.  Do not eat a lot of foods that are high in solid fats, added sugars, or sodium. Maintain a healthy weight Body mass index (BMI) is used to identify weight problems. It estimates body fat based on height and weight. Your health care provider can help determine your BMI and help you achieve or maintain a healthy weight. Get regular exercise Get regular exercise. This is one of the most important things you can do for your health. Most adults should:  Exercise for at least 150 minutes each week. The exercise should increase your heart rate and make you sweat (moderate-intensity exercise).  Do strengthening exercises at least twice a week. This is in addition to the moderate-intensity exercise.  Spend less time sitting. Even light physical activity can be beneficial. Watch cholesterol and blood lipids Have your blood tested for lipids and cholesterol at 53 years of age, then have this test every 5 years. Have your cholesterol levels checked more often if:  Your lipid or cholesterol levels are high.  You are  older than 53 years of age.  You are at high risk for heart disease. What should I know about cancer screening? Depending on your health history and family history, you may need to have cancer screening at various ages. This may include screening for:  Breast cancer.  Cervical cancer.  Colorectal cancer.  Skin cancer.  Lung cancer. What should I know about heart disease, diabetes, and high blood pressure? Blood pressure and heart disease  High blood pressure causes heart disease and increases the risk of stroke. This is more likely to develop in people who have high blood pressure readings, are of African descent, or are overweight.  Have your blood pressure checked: ? Every 3-5 years if you are 41-93 years of age. ? Every year if you are 33 years old or older. Diabetes Have regular diabetes screenings. This checks your fasting blood sugar level. Have the screening done:  Once every three years after age 15 if you are at a normal weight and have a low risk for diabetes.  More often and at a younger age if you are overweight or have a high risk for diabetes. What should I know about preventing infection? Hepatitis B If you have a higher risk for hepatitis B, you should be screened for this virus. Talk with your health care provider to find out if you are at risk for hepatitis B infection. Hepatitis C Testing is recommended for:  Everyone born from [birth year] through 1964.  Anyone with known risk factors for hepatitis C. Sexually transmitted infections (  STIs)  Get screened for STIs, including gonorrhea and chlamydia, if: ? You are sexually active and are younger than 53 years of age. ? You are older than 53 years of age and your health care provider tells you that you are at risk for this type of infection. ? Your sexual activity has changed since you were last screened, and you are at increased risk for chlamydia or gonorrhea. Ask your health care provider if you are at  risk.  Ask your health care provider about whether you are at high risk for HIV. Your health care provider may recommend a prescription medicine to help prevent HIV infection. If you choose to take medicine to prevent HIV, you should first get tested for HIV. You should then be tested every 3 months for as long as you are taking the medicine. Pregnancy  If you are about to stop having your period (premenopausal) and you may become pregnant, seek counseling before you get pregnant.  Take 400 to 800 micrograms (mcg) of folic acid every day if you become pregnant.  Ask for birth control (contraception) if you want to prevent pregnancy. Osteoporosis and menopause Osteoporosis is a disease in which the bones lose minerals and strength with aging. This can result in bone fractures. If you are 86 years old or older, or if you are at risk for osteoporosis and fractures, ask your health care provider if you should:  Be screened for bone loss.  Take a calcium or vitamin D supplement to lower your risk of fractures.  Be given hormone replacement therapy (HRT) to treat symptoms of menopause. Follow these instructions at home: Lifestyle  Do not use any products that contain nicotine or tobacco, such as cigarettes, e-cigarettes, and chewing tobacco. If you need help quitting, ask your health care provider.  Do not use street drugs.  Do not share needles.  Ask your health care provider for help if you need support or information about quitting drugs. Alcohol use  Do not drink alcohol if: ? Your health care provider tells you not to drink. ? You are pregnant, may be pregnant, or are planning to become pregnant.  If you drink alcohol: ? Limit how much you use to 0-1 drink a day. ? Limit intake if you are breastfeeding.  Be aware of how much alcohol is in your drink. In the [place] one drink equals one 12 oz bottle of beer (355 mL), one 5 oz glass of wine (148 mL), or one 1 oz glass of hard liquor  (44 mL). General instructions  Schedule regular health, dental, and eye exams.  Stay current with your vaccines.  Tell your health care provider if: ? You often feel depressed. ? You have ever been abused or do not feel safe at home. Summary  Adopting a healthy lifestyle and getting preventive care are important in promoting health and wellness.  Follow your health care provider's instructions about healthy diet, exercising, and getting tested or screened for diseases.  Follow your health care provider's instructions on monitoring your cholesterol and blood pressure. This information is not intended to replace advice given to you by your health care provider. Make sure you discuss any questions you have with your health care provider. Document Revised: 04/27/2018 Document Reviewed: 04/27/2018 Elsevier Patient Education  2020 [employer].

## 2019-08-09 ENCOUNTER — Other Ambulatory Visit: Payer: Self-pay

## 2019-08-09 ENCOUNTER — Ambulatory Visit (INDEPENDENT_AMBULATORY_CARE_PROVIDER_SITE_OTHER): Payer: Medicare HMO | Admitting: Physician Assistant

## 2019-08-09 ENCOUNTER — Encounter (INDEPENDENT_AMBULATORY_CARE_PROVIDER_SITE_OTHER): Payer: Self-pay | Admitting: Physician Assistant

## 2019-08-09 VITALS — BP 112/75 | HR 63 | Temp 98.0°F | Ht 62.0 in | Wt 198.0 lb

## 2019-08-09 DIAGNOSIS — E7849 Other hyperlipidemia: Secondary | ICD-10-CM

## 2019-08-09 DIAGNOSIS — Z6836 Body mass index (BMI) 36.0-36.9, adult: Secondary | ICD-10-CM

## 2019-08-09 LAB — CYTOLOGY - PAP
Adequacy: ABSENT
Comment: NEGATIVE
Diagnosis: NEGATIVE
High risk HPV: NEGATIVE

## 2019-08-09 NOTE — Progress Notes (Signed)
Chief Complaint:   OBESITY Brittney Ruiz is here to discuss her progress with her obesity treatment plan along with follow-up of her obesity related diagnoses. Brittney Ruiz is on the Category 3 Plan and states she is following her eating plan approximately 99% of the time. Brittney Ruiz states she is Walking Away Pounds 17 minutes 3 times per week.  Today's visit was #: 6 Starting weight: 217 lbs Starting date: 06/01/2019 Today's weight: 198 lbs Today's date: 08/09/2019 Total lbs lost to date: 19 Total lbs lost since last in-office visit: 4  Interim History: Brittney Ruiz states that she is not getting all of her protein in at dinner. She has been tracking her protein and calories.  Subjective:   Other hyperlipidemia. Gift states she is on 1/2 Zetia, but it is not on her medication list. Last level was not at goal. She is exercising regularly.  Lab Results  Component Value Date   CHOL 210 (H) 06/01/2019   HDL 34 (L) 06/01/2019   LDLCALC 130 (H) 06/01/2019   LDLDIRECT 141.0 05/02/2018   TRIG 256 (H) 06/01/2019   CHOLHDL 8 05/02/2018   Lab Results  Component Value Date   ALT 33 (H) 06/01/2019   AST 32 06/01/2019   ALKPHOS 88 06/01/2019   BILITOT 0.2 06/01/2019   The 10-year ASCVD risk score [name], et al., 2013) is: 2.8%   Values used to calculate the score:     Age: 22 years     Sex: Female     Is Non-Hispanic African American: No     Diabetic: No     Tobacco smoker: No     Systolic Blood Pressure: [id number] mmHg     Is BP treated: Yes     HDL Cholesterol: 34 mg/dL     Total Cholesterol: 210 mg/dL  Assessment/Plan:   Other hyperlipidemia. Cardiovascular risk and specific lipid/LDL goals reviewed.  We discussed several lifestyle modifications today and [name] will continue to work on diet, exercise and weight loss efforts. Orders and follow up as documented in patient record.   Counseling Intensive lifestyle modifications are the first line treatment for this issue. . Dietary changes:  Increase soluble fiber. Decrease simple carbohydrates. . Exercise changes: Moderate to vigorous-intensity aerobic activity 150 minutes per week if tolerated. . Lipid-lowering medications: see documented in medical record.  Class 2 severe obesity with serious comorbidity and body mass index (BMI) of 36.0 to 36.9 in adult, unspecified obesity type ([place]).  Brittney Ruiz is currently in the action stage of change. As such, her goal is to continue with weight loss efforts. She has agreed to the Category 3 Plan.   Exercise goals: For substantial health benefits, adults should do at least 150 minutes (2 hours and 30 minutes) a week of moderate-intensity, or 75 minutes (1 hour and 15 minutes) a week of vigorous-intensity aerobic physical activity, or an equivalent combination of moderate- and vigorous-intensity aerobic activity. Aerobic activity should be performed in episodes of at least 10 minutes, and preferably, it should be spread throughout the week.  Behavioral modification strategies: increasing lean protein intake and meal planning and cooking strategies.  Brittney Ruiz has agreed to follow-up with our clinic in 3 weeks. She was informed of the importance of frequent follow-up visits to maximize her success with intensive lifestyle modifications for her multiple health conditions.   Objective:   Blood pressure 112/75, pulse 63, temperature 98 F (36.7 C), temperature source Oral, height 5\' 2"  (1.575 m), weight 198 lb (89.8  kg), SpO2 97 %. Body mass index is 36.21 kg/m.  General: Cooperative, alert, well developed, in no acute distress. HEENT: Conjunctivae and lids unremarkable. Cardiovascular: Regular rhythm.  Lungs: Normal work of breathing. Neurologic: No focal deficits.   Lab Results  Component Value Date   CREATININE 0.56 (L) 06/01/2019   BUN 15 06/01/2019   NA 142 06/01/2019   K 4.4 06/01/2019   CL 103 06/01/2019   CO2 25 06/01/2019   Lab Results  Component Value Date   ALT 33 (H)  06/01/2019   AST 32 06/01/2019   ALKPHOS 88 06/01/2019   BILITOT 0.2 06/01/2019   Lab Results  Component Value Date   HGBA1C 5.4 06/01/2019   HGBA1C 5.7 05/02/2018   HGBA1C 5.4 04/20/2016   HGBA1C 5.4 05/28/2015   HGBA1C 5.4 10/02/2014   Lab Results  Component Value Date   INSULIN 26.1 (H) 06/01/2019   Lab Results  Component Value Date   TSH 1.810 06/01/2019   Lab Results  Component Value Date   CHOL 210 (H) 06/01/2019   HDL 34 (L) 06/01/2019   LDLCALC 130 (H) 06/01/2019   LDLDIRECT 141.0 05/02/2018   TRIG 256 (H) 06/01/2019   CHOLHDL 8 05/02/2018   Lab Results  Component Value Date   WBC 6.9 06/01/2019   HGB 14.5 06/01/2019   HCT 43.1 06/01/2019   MCV 86 06/01/2019   PLT 175 06/01/2019   Lab Results  Component Value Date   FERRITIN 111 08/12/2012   Attestation Statements:   Reviewed by clinician on day of visit: allergies, medications, problem list, medical history, surgical history, family history, social history, and previous encounter notes.  Time spent on visit including pre-visit chart review and post-visit charting and care was 35 minutes.   IMichaelene Song, am acting as transcriptionist for Abby Potash, PA-C   I have reviewed the above documentation for accuracy and completeness, and I agree with the above. Abby Potash, PA-C

## 2019-08-14 ENCOUNTER — Ambulatory Visit: Payer: Medicare HMO | Attending: Internal Medicine

## 2019-08-14 DIAGNOSIS — Z23 Encounter for immunization: Secondary | ICD-10-CM

## 2019-08-14 NOTE — Progress Notes (Signed)
   Covid-19 Vaccination Clinic  Name:  Brittney Ruiz    MRN: [id number] DOB: Jun 18, 1965  08/14/2019  [name] was observed post Covid-19 immunization for 15 minutes without incident. She was provided with Vaccine Information Sheet and instruction to access the V-Safe system.   [name] was instructed to call 911 with any severe reactions post vaccine: Marland Kitchen Difficulty breathing  . Swelling of face and throat  . A fast heartbeat  . A bad rash all over body  . Dizziness and weakness   Immunizations Administered    Name Date Dose VIS Date Route   Pfizer COVID-19 Vaccine 08/14/2019  1:06 PM 0.3 mL 04/28/2019 Intramuscular   Manufacturer: [place]   Lot: [id number]   [place]: [id number]

## 2019-08-30 ENCOUNTER — Other Ambulatory Visit: Payer: Self-pay

## 2019-08-30 ENCOUNTER — Encounter (INDEPENDENT_AMBULATORY_CARE_PROVIDER_SITE_OTHER): Payer: Self-pay | Admitting: Physician Assistant

## 2019-08-30 ENCOUNTER — Ambulatory Visit (INDEPENDENT_AMBULATORY_CARE_PROVIDER_SITE_OTHER): Payer: Medicare HMO | Admitting: Physician Assistant

## 2019-08-30 VITALS — BP 116/76 | HR 64 | Temp 97.7°F | Ht 62.0 in | Wt 194.0 lb

## 2019-08-30 DIAGNOSIS — E559 Vitamin D deficiency, unspecified: Secondary | ICD-10-CM | POA: Diagnosis not present

## 2019-08-30 DIAGNOSIS — Z6835 Body mass index (BMI) 35.0-35.9, adult: Secondary | ICD-10-CM

## 2019-08-30 NOTE — Progress Notes (Signed)
Chief Complaint:   OBESITY Brittney Ruiz is here to discuss her progress with her obesity treatment plan along with follow-up of her obesity related diagnoses. Brittney Ruiz is on the Category 3 Plan and states she is following her eating plan approximately 99% of the time. Brittney Ruiz states she is exercising for 0 minutes 0 times per week.  Today's visit was #: 7 Starting weight: 217 lbs Starting date: 06/01/2019 Today's weight: 194 lbs Today's date: 08/30/2019 Total lbs lost to date: 23 lbs Total lbs lost since last in-office visit: 4 lbs  Interim History: Brittney Ruiz states that she is following the plan well.  She is enjoying it and not having any issues with it.  Subjective:   1. Vitamin D deficiency Brittney Ruiz's Vitamin D level was 28.7 on 06/01/2019. She is currently taking prescription vitamin D 50,000 IU each week. She denies nausea, vomiting or muscle weakness.  Assessment/Plan:   1. Vitamin D deficiency Low Vitamin D level contributes to fatigue and are associated with obesity, breast, and colon cancer. She agrees to continue to take prescription Vitamin D @50 ,000 IU every week and will follow-up for routine testing of Vitamin D, at least 2-3 times per year to avoid over-replacement.  2. Class 2 severe obesity with serious comorbidity and body mass index (BMI) of 35.0 to 35.9 in adult, unspecified obesity type [facility]) Brittney Ruiz is currently in the action stage of change. As such, her goal is to continue with weight loss efforts. She has agreed to the Category 3 Plan.   Exercise goals: For substantial health benefits, adults should do at least 150 minutes (2 hours and 30 minutes) a week of moderate-intensity, or 75 minutes (1 hour and 15 minutes) a week of vigorous-intensity aerobic physical activity, or an equivalent combination of moderate- and vigorous-intensity aerobic activity. Aerobic activity should be performed in episodes of at least 10 minutes, and preferably, it should be spread throughout the  week.  Behavioral modification strategies: meal planning and cooking strategies and keeping healthy foods in the home.  Brittney Ruiz has agreed to follow-up with our clinic in 4 weeks. She was informed of the importance of frequent follow-up visits to maximize her success with intensive lifestyle modifications for her multiple health conditions.   Objective:   Blood pressure 116/76, pulse 64, temperature 97.7 F (36.5 C), temperature source Oral, height 5\' 2"  (1.575 m), weight 194 lb (88 kg), SpO2 97 %. Body mass index is 35.48 kg/m.  General: Cooperative, alert, well developed, in no acute distress. HEENT: Conjunctivae and lids unremarkable. Cardiovascular: Regular rhythm.  Lungs: Normal work of breathing. Neurologic: No focal deficits.   Lab Results  Component Value Date   CREATININE 0.56 (L) 06/01/2019   BUN 15 06/01/2019   NA 142 06/01/2019   K 4.4 06/01/2019   CL 103 06/01/2019   CO2 25 06/01/2019   Lab Results  Component Value Date   ALT 33 (H) 06/01/2019   AST 32 06/01/2019   ALKPHOS 88 06/01/2019   BILITOT 0.2 06/01/2019   Lab Results  Component Value Date   HGBA1C 5.4 06/01/2019   HGBA1C 5.7 05/02/2018   HGBA1C 5.4 04/20/2016   HGBA1C 5.4 05/28/2015   HGBA1C 5.4 10/02/2014   Lab Results  Component Value Date   INSULIN 26.1 (H) 06/01/2019   Lab Results  Component Value Date   TSH 1.810 06/01/2019   Lab Results  Component Value Date   CHOL 210 (H) 06/01/2019   HDL 34 (L) 06/01/2019   LDLCALC 130 (  H) 06/01/2019   LDLDIRECT 141.0 05/02/2018   TRIG 256 (H) 06/01/2019   CHOLHDL 8 05/02/2018   Lab Results  Component Value Date   WBC 6.9 06/01/2019   HGB 14.5 06/01/2019   HCT 43.1 06/01/2019   MCV 86 06/01/2019   PLT 175 06/01/2019   Lab Results  Component Value Date   FERRITIN 111 08/12/2012   Attestation Statements:   Reviewed by clinician on day of visit: allergies, medications, problem list, medical history, surgical history, family history,  social history, and previous encounter notes.  Time spent on visit including pre-visit chart review and post-visit care and charting was 33 minutes.   I, Water quality scientist, CMA, am acting as Location manager for [employer], PA-C.  I have reviewed the above documentation for accuracy and completeness, and I agree with the above. -  *Abby Potash, PA-C

## 2019-09-06 ENCOUNTER — Ambulatory Visit: Payer: Medicare HMO | Attending: Internal Medicine

## 2019-09-06 DIAGNOSIS — Z23 Encounter for immunization: Secondary | ICD-10-CM

## 2019-09-06 NOTE — Progress Notes (Signed)
   Covid-19 Vaccination Clinic  Name:  Brittney Ruiz    MRN: [id number] DOB: August 25, 1965  09/06/2019  [name] was observed post Covid-19 immunization for 30 minutes based on pre-vaccination screening without incident. She was provided with Vaccine Information Sheet and instruction to access the V-Safe system.   [name] was instructed to call 911 with any severe reactions post vaccine: Marland Kitchen Difficulty breathing  . Swelling of face and throat  . A fast heartbeat  . A bad rash all over body  . Dizziness and weakness   Immunizations Administered    Name Date Dose VIS Date Route   Pfizer COVID-19 Vaccine 09/06/2019  1:52 PM 0.3 mL 07/12/2018 Intramuscular   Manufacturer: [employer]   Lot: [id number]   [place]: [id number]

## 2019-09-14 ENCOUNTER — Ambulatory Visit (INDEPENDENT_AMBULATORY_CARE_PROVIDER_SITE_OTHER): Payer: Medicare HMO | Admitting: Cardiovascular Disease

## 2019-09-14 ENCOUNTER — Encounter: Payer: Self-pay | Admitting: Cardiovascular Disease

## 2019-09-14 ENCOUNTER — Other Ambulatory Visit: Payer: Self-pay

## 2019-09-14 VITALS — BP 106/80 | HR 60 | Ht 61.5 in | Wt 197.0 lb

## 2019-09-14 DIAGNOSIS — I10 Essential (primary) hypertension: Secondary | ICD-10-CM

## 2019-09-14 DIAGNOSIS — I059 Rheumatic mitral valve disease, unspecified: Secondary | ICD-10-CM | POA: Diagnosis not present

## 2019-09-14 NOTE — Progress Notes (Signed)
Cardiology Office Note   Date:  09/14/2019   ID:  Brittney Ruiz, DOB 13-Jul-1965, MRN [id number]  PCP:  Burnard Hawthorne, FNP  Cardiologist:   Kathlyn Sacramento, MD   Chief Complaint  Patient presents with  . OTHER    12 month f/u no complaints today. Meds reviewed verbally with pt.      History of Present Illness: Brittney Ruiz is a 54 y.o. female who presents for a follow-up visit regarding previous chest pain and mitral valve prolapse.  She has history of hypertension, hyperlipidemia with intolerance to statins, multiple medication intolerances, prior tobacco abuse, asthma, obesity, anxiety, and snoring without evidence of obstructive sleep apnea on recent sleep study in 2019 .  Previous echocardiogram in 2017 showed an EF of 60 to 65%, no regional wall motion abnormalities, mild coaptation of the mitral valve leaflets with mild to moderate regurgitation, mildly dilated left atrium, PASP 35 mmHg.  Nuclear stress testing at that time showed no significant ischemia, EF 53%, low risk scan.   She has known history of recurrent atypical chest pain.  She underwent CTA of the coronary arteries in February 2020 which showed normal coronary arteries with calcium score of 0.  Echocardiogram in February of 2020 showed normal LV systolic function with no significant valvular abnormalities.  There was no evidence of mitral valve prolapse on that study and mitral regurgitation was mild.  Doing well overall with minimal chest pain.  She has stable exertional dyspnea due to underlying asthma.  She has been working with wellness program at [facility] to help with weight loss.  She has been able to lose more than 30 pounds.  She is feeling significantly better.   Past Medical History:  Diagnosis Date  . Abnormal Pap smear of cervix    h/o LEEP  . Allergy   . Angio-edema   . Anxiety   . Asthma   . Bladder leak   . Cancer ([place])    melanoma 2003  . Chest pain   . Constipation   . COPD (chronic  obstructive pulmonary disease) ([place])   . Depression   . Diarrhea   . Food allergy   . Heart valve problem   . Heartburn   . History of swelling of feet   . Hyperlipidemia   . Hypertension   . IBS (irritable bowel syndrome)   . Joint pain   . Low back pain   . Melanoma ([place])    right foot s/p lymph node removed right groin  . Obesity (BMI 30-39.9)   . Palpitation   . Prediabetes   . Shortness of breath   . Urticaria   . Vaginal delivery    X 2    Past Surgical History:  Procedure Laterality Date  . BLADDER SUSPENSION  07/2017  . COLONOSCOPY WITH PROPOFOL N/A 07/01/2017   Procedure: COLONOSCOPY WITH PROPOFOL;  Surgeon: Virgel Manifold, MD;  Location: [facility] ENDOSCOPY;  Service: Endoscopy;  Laterality: N/A;  . ESOPHAGOGASTRODUODENOSCOPY (EGD) WITH PROPOFOL N/A 07/01/2017   Procedure: ESOPHAGOGASTRODUODENOSCOPY (EGD) WITH PROPOFOL;  Surgeon: Virgel Manifold, MD;  Location: [facility] ENDOSCOPY;  Service: Endoscopy;  Laterality: N/A;  . LEEP  1995  . LEEP    . PARTIAL HYSTERECTOMY  08/2017   s/p partial hysterectomy - per patient HAS cervix, uterus, fallopian tubes; ovaries INTACT  . SKIN CANCER EXCISION     melanoma  . TONSILLECTOMY       Current Outpatient Medications  Medication Sig Dispense  Refill  . ACCU-CHEK SOFTCLIX LANCETS lancets TEST BLOOD SUGAR UP TO FOUR TIMES DAILY AS DIRECTED 400 each 0  . ACETAMINOPHEN PO Take by mouth as needed.      Marland Kitchen albuterol (PROVENTIL HFA;VENTOLIN HFA) 108 (90 Base) MCG/ACT inhaler Inhale 2 puffs into the lungs every 6 (six) hours as needed. 1 Inhaler 1  . albuterol (PROVENTIL) (2.5 MG/3ML) 0.083% nebulizer solution Take 3 mLs (2.5 mg total) by nebulization every 6 (six) hours as needed for wheezing or shortness of breath. 30 mL 3  . aspirin EC 81 MG tablet Take 81 mg by mouth daily.    . B Complex Vitamins (BL VITAMIN B COMPLEX PO) +Folic acid qd.    . Blood Glucose Monitoring Suppl (ACCU-CHEK AVIVA PLUS) w/Device KIT USE UP TO FOUR  TIMES DAILY AS DIRECTED 1 kit 0  . clorazepate (TRANXENE) 7.5 MG tablet Take 7.5 mg by mouth 3 (three) times daily.    . fexofenadine (ALLEGRA) 60 MG tablet Take 60 mg by mouth daily.    . fluticasone (FLONASE) 50 MCG/ACT nasal spray USE 2 SPRAYS INTO BOTH NOSTRILS DAILY. 48 g 2  . glucose blood (ACCU-CHEK AVIVA PLUS) test strip USE UP TO FOUR TIMES DAILY AS DIRECTED 100 each 11  . ibuprofen (ADVIL) 200 MG tablet Take 200 mg by mouth every 6 (six) hours as needed.    . Multiple Vitamin (MULTIVITAMIN) tablet Take 1 tablet by mouth daily.    . Omega-3 Fatty Acids (FISH OIL) 1000 MG CAPS Take 1 capsule by mouth daily.    Marland Kitchen omeprazole (PRILOSEC) 20 MG capsule TAKE 1 CAPSULE EVERY DAY 90 capsule 0  . polyethylene glycol powder (MIRALAX) powder Take 1 Container by mouth daily.    . propranolol (INDERAL) 20 MG tablet Take 1 tablet (20 mg total) by mouth 3 (three) times daily. 90 tablet 3  . psyllium (METAMUCIL) 58.6 % powder Take 1 packet by mouth daily.    . sertraline (ZOLOFT) 100 MG tablet TAKE 1+1/2 TABLETS BY MOUTH DAILY AS DIRECTED 45 tablet 3  . sertraline (ZOLOFT) 25 MG tablet Take 25 mg by mouth daily.     Marland Kitchen Spacer/Aero Chamber Mouthpiece MISC 1 puff by Does not apply route as needed. 1 each 1  . Vitamin D, Ergocalciferol, (DRISDOL) 1.25 MG (50000 UNIT) CAPS capsule Take 1 capsule (50,000 Units total) by mouth every 7 (seven) days. 4 capsule 0   No current facility-administered medications for this visit.    Allergies:   Pork allergy, Pork-derived products, Sulfur, Aciphex [rabeprazole sodium], Antivert [meclizine hcl], Buspar [buspirone hcl], Cat hair extract, Chlorpheniramine-pseudoeph, Chlorpheniramine-pseudoeph, Chocolate flavor, Codeine, Crestor [rosuvastatin calcium], Cymbalta [duloxetine hcl], Duloxetine, Effexor xr [venlafaxine hydrochloride], Epinephrine, Erythromycin, Escitalopram oxalate, Lamictal [lamotrigine], Levofloxacin, Lipitor [atorvastatin], Meclizine, Mometasone furoate,  Mometasone furoate, Monosodium glutamate, Neurontin [gabapentin], Other, Prednisone, Shellfish allergy, Sudafed [pseudoephedrine hcl], Sulfa antibiotics, Sulfasalazine, Valium, Wellbutrin [bupropion hcl], Wixela inhub [fluticasone-salmeterol], Xanax xr [alprazolam], Alfalfa, Brompheniramine-phenylephrine, Buspirone, and Tape    Social History:  The patient  reports that she quit smoking about 11 years ago. Her smoking use included cigarettes. She has a 20.00 pack-year smoking history. She has never used smokeless tobacco. She reports that she does not drink alcohol or use drugs.   Family History:  The patient's family history includes Alcoholism in her father; Breast cancer (age of onset: 72) in her mother; Cancer in her father, maternal grandmother, mother, and paternal grandfather; Depression in her father and maternal grandfather; Diabetes in her father and mother; Heart disease  in her maternal grandfather; Hyperlipidemia in her brother, father, mother, and sister; Hypertension in her brother, father, mother, and sister; Migraines in her brother and sister; Sleep apnea in her father; Stroke in her mother.    ROS:  Please see the history of present illness.   Otherwise, review of systems are positive for none.   All other systems are reviewed and negative.    PHYSICAL EXAM: VS:  BP 106/80 (BP Location: Left Arm, Patient Position: Sitting, Cuff Size: Normal)   Pulse 60   Ht 5' 1.5" (1.562 m)   Wt 197 lb (89.4 kg)   LMP  (LMP Unknown) Comment: urine Preg negative   SpO2 98%   BMI 36.62 kg/m  , BMI Body mass index is 36.62 kg/m. GEN: Well nourished, well developed, in no acute distress  HEENT: normal  Neck: no JVD, carotid bruits, or masses Cardiac: RRR; no murmurs, rubs, or gallops,no edema  Respiratory:  clear to auscultation bilaterally, normal work of breathing GI: soft, nontender, nondistended, + BS MS: no deformity or atrophy  Skin: warm and dry, no rash Neuro:  Strength and  sensation are intact Psych: euthymic mood, full affect   EKG:  EKG is ordered today. The ekg ordered today demonstrates normal sinus rhythm with no significant ST or T wave changes.   Recent Labs: 06/01/2019: ALT 33; BUN 15; Creatinine, Ser 0.56; Hemoglobin 14.5; Platelets 175; Potassium 4.4; Sodium 142; TSH 1.810    Lipid Panel    Component Value Date/Time   CHOL 210 (H) 06/01/2019 1235   TRIG 256 (H) 06/01/2019 1235   HDL 34 (L) 06/01/2019 1235   CHOLHDL 8 05/02/2018 1457   VLDL 72.0 (H) 05/02/2018 1457   LDLCALC 130 (H) 06/01/2019 1235   LDLDIRECT 141.0 05/02/2018 1457      Wt Readings from Last 3 Encounters:  09/14/19 197 lb (89.4 kg)  08/30/19 194 lb (88 kg)  08/09/19 198 lb (89.8 kg)     No flowsheet data found.    ASSESSMENT AND PLAN:  1. Mitral valve disease: Previous mitral valve prolapse noted on echocardiogram 2017 but most recent echocardiogram was reassuring.  I reviewed the most recent echocardiogram and there was no convincing evidence of mitral valve prolapse.  She has no heart murmurs today.  No need for repeat cardiac testing at the present time.  2.  Chronic atypical chest pain: Previous CTA of the coronary arteries last year was normal.     Disposition:   FU with [name] as needed.  Signed,  Kathlyn Sacramento, MD  09/14/2019 4:21 PM    [place]

## 2019-09-14 NOTE — Patient Instructions (Signed)
Medication Instructions:  Your physician recommends that you continue on your current medications as directed. Please refer to the Current Medication list given to you today.  *If you need a refill on your cardiac medications before your next appointment, please call your pharmacy*   Lab Work: None ordered If you have labs (blood work) drawn today and your tests are completely normal, you will receive your results only by: . MyChart Message (if you have MyChart) OR . A paper copy in the mail If you have any lab test that is abnormal or we need to change your treatment, we will call you to review the results.   Testing/Procedures: None ordered   Follow-Up: At [facility], you and your health needs are our priority.  As part of our continuing mission to provide you with exceptional heart care, we have created designated Provider Care Teams.  These Care Teams include your primary Cardiologist (physician) and Advanced Practice Providers (APPs -  Physician Assistants and Nurse Practitioners) who all work together to provide you with the care you need, when you need it.  We recommend signing up for the patient portal called "MyChart".  Sign up information is provided on this After Visit Summary.  MyChart is used to connect with patients for Virtual Visits (Telemedicine).  Patients are able to view lab/test results, encounter notes, upcoming appointments, etc.  Non-urgent messages can be sent to your provider as well.   To learn more about what you can do with MyChart, go to [email or web address].    Your next appointment:   As needed   The format for your next appointment:   In Person  Provider:    You may see  Dr. Arida or one of the following Advanced Practice Providers on your designated Care Team:    Christopher Berge, NP  Ryan Dunn, PA-C  Jacquelyn Visser, PA-C    Other Instructions N/A  

## 2019-09-19 ENCOUNTER — Encounter (INDEPENDENT_AMBULATORY_CARE_PROVIDER_SITE_OTHER): Payer: Self-pay | Admitting: Physician Assistant

## 2019-09-19 ENCOUNTER — Other Ambulatory Visit: Payer: Self-pay

## 2019-09-19 ENCOUNTER — Ambulatory Visit (INDEPENDENT_AMBULATORY_CARE_PROVIDER_SITE_OTHER): Payer: Medicare HMO | Admitting: Physician Assistant

## 2019-09-19 VITALS — BP 104/72 | HR 63 | Temp 97.6°F | Ht 62.0 in | Wt 197.0 lb

## 2019-09-19 DIAGNOSIS — Z6836 Body mass index (BMI) 36.0-36.9, adult: Secondary | ICD-10-CM | POA: Diagnosis not present

## 2019-09-19 DIAGNOSIS — E559 Vitamin D deficiency, unspecified: Secondary | ICD-10-CM | POA: Diagnosis not present

## 2019-09-19 DIAGNOSIS — E7849 Other hyperlipidemia: Secondary | ICD-10-CM

## 2019-09-19 MED ORDER — VITAMIN D (ERGOCALCIFEROL) 1.25 MG (50000 UNIT) PO CAPS: 50000.0000 [IU] | ORAL_CAPSULE | ORAL | 0 refills | Status: DC

## 2019-09-19 NOTE — Progress Notes (Signed)
Chief Complaint:   OBESITY Brittney Ruiz is here to discuss her progress with her obesity treatment plan along with follow-up of her obesity related diagnoses. Nature is on the Category 3 Plan and states she is following her eating plan approximately 97% of the time. Brittney Ruiz states she is exercising 0 minutes 0 times per week.  Today's visit was #: 8 Starting weight: 217 lbs Starting date: 06/01/2019 Today's weight: 197 lbs Today's date: 09/19/2019 Total lbs lost to date: 20 Total lbs lost since last in-office visit: 0  Interim History: Brittney Ruiz notes that she is constipated and has not had a bowel movement in 3 days. She did not journal this past week due to stress at home. She states she is not drinking enough water.  Subjective:   Vitamin D deficiency. No nausea, vomiting, or muscle weakness on Vitamin D. Last Vitamin D level was not at goal - 28.7 on 06/01/2019.  Other hyperlipidemia. Brittney Ruiz is on no medication. No chest pain.   Lab Results  Component Value Date   CHOL 210 (H) 06/01/2019   HDL 34 (L) 06/01/2019   LDLCALC 130 (H) 06/01/2019   LDLDIRECT 141.0 05/02/2018   TRIG 256 (H) 06/01/2019   CHOLHDL 8 05/02/2018   Lab Results  Component Value Date   ALT 33 (H) 06/01/2019   AST 32 06/01/2019   ALKPHOS 88 06/01/2019   BILITOT 0.2 06/01/2019   The 10-year ASCVD risk score [name], et al., 2013) is: 2.4%   Values used to calculate the score:     Age: 66 years     Sex: Female     Is Non-Hispanic African American: No     Diabetic: No     Tobacco smoker: No     Systolic Blood Pressure: 123456 mmHg     Is BP treated: Yes     HDL Cholesterol: 34 mg/dL     Total Cholesterol: 210 mg/dL  Assessment/Plan:   Vitamin D deficiency. Low Vitamin D level contributes to fatigue and are associated with obesity, breast, and colon cancer. She was given a refill on her Vitamin D, Ergocalciferol, (DRISDOL) 1.25 MG (50000 UNIT) CAPS capsule every week #4 with 0 refills and will follow-up  for routine testing of Vitamin D, at least 2-3 times per year to avoid over-replacement.    Other hyperlipidemia. Cardiovascular risk and specific lipid/LDL goals reviewed.  We discussed several lifestyle modifications today and Brittney Ruiz will continue to work on diet, exercise and weight loss efforts. Orders and follow up as documented in patient record.   Counseling Intensive lifestyle modifications are the first line treatment for this issue. . Dietary changes: Increase soluble fiber. Decrease simple carbohydrates. . Exercise changes: Moderate to vigorous-intensity aerobic activity 150 minutes per week if tolerated. . Lipid-lowering medications: see documented in medical record.  Class 2 severe obesity with serious comorbidity and body mass index (BMI) of 36.0 to 36.9 in adult, unspecified obesity type ([name]).  [name] is currently in the action stage of change. As such, her goal is to continue with weight loss efforts. She has agreed to the Category 3 Plan.   Exercise goals: For substantial health benefits, adults should do at least 150 minutes (2 hours and 30 minutes) a week of moderate-intensity, or 75 minutes (1 hour and 15 minutes) a week of vigorous-intensity aerobic physical activity, or an equivalent combination of moderate- and vigorous-intensity aerobic activity. Aerobic activity should be performed in episodes of at least 10 minutes, and  preferably, it should be spread throughout the week.  Behavioral modification strategies: increasing water intake, meal planning and cooking strategies and keeping a strict food journal.  Brittney Ruiz has agreed to follow-up with our clinic in 3-4 weeks. She was informed of the importance of frequent follow-up visits to maximize her success with intensive lifestyle modifications for her multiple health conditions.   Objective:   Blood pressure 104/72, pulse 63, temperature 97.6 F (36.4 C), temperature source Oral, height 5\' 2"  (1.575 m), weight 197 lb (89.4 kg),  SpO2 98 %. Body mass index is 36.03 kg/m.  General: Cooperative, alert, well developed, in no acute distress. HEENT: Conjunctivae and lids unremarkable. Cardiovascular: Regular rhythm.  Lungs: Normal work of breathing. Neurologic: No focal deficits.   Lab Results  Component Value Date   CREATININE 0.56 (L) 06/01/2019   BUN 15 06/01/2019   NA 142 06/01/2019   K 4.4 06/01/2019   CL 103 06/01/2019   CO2 25 06/01/2019   Lab Results  Component Value Date   ALT 33 (H) 06/01/2019   AST 32 06/01/2019   ALKPHOS 88 06/01/2019   BILITOT 0.2 06/01/2019   Lab Results  Component Value Date   HGBA1C 5.4 06/01/2019   HGBA1C 5.7 05/02/2018   HGBA1C 5.4 04/20/2016   HGBA1C 5.4 05/28/2015   HGBA1C 5.4 10/02/2014   Lab Results  Component Value Date   INSULIN 26.1 (H) 06/01/2019   Lab Results  Component Value Date   TSH 1.810 06/01/2019   Lab Results  Component Value Date   CHOL 210 (H) 06/01/2019   HDL 34 (L) 06/01/2019   LDLCALC 130 (H) 06/01/2019   LDLDIRECT 141.0 05/02/2018   TRIG 256 (H) 06/01/2019   CHOLHDL 8 05/02/2018   Lab Results  Component Value Date   WBC 6.9 06/01/2019   HGB 14.5 06/01/2019   HCT 43.1 06/01/2019   MCV 86 06/01/2019   PLT 175 06/01/2019   Lab Results  Component Value Date   FERRITIN 111 08/12/2012   Obesity Behavioral Intervention Documentation for Insurance:   Approximately 15 minutes were spent on the discussion below.  ASK: We discussed the diagnosis of obesity with [name] today and [name] agreed to give [name] permission to discuss obesity behavioral modification therapy today.  ASSESS: Travis has the diagnosis of obesity and her BMI today is 36.1. [name] is in the action stage of change.   ADVISE: [name] was educated on the multiple health risks of obesity as well as the benefit of weight loss to improve her health. She was advised of the need for long term treatment and the importance of lifestyle modifications to improve her current health  and to decrease her risk of future health problems.  AGREE: Multiple dietary modification options and treatment options were discussed and [name] agreed to follow the recommendations documented in the above note.  ARRANGE: Zade was educated on the importance of frequent visits to treat obesity as outlined per CMS and USPSTF guidelines and agreed to schedule her next follow up appointment today.  Attestation Statements:   Reviewed by clinician on day of visit: allergies, medications, problem list, medical history, surgical history, family history, social history, and previous encounter notes.  IMichaelene Song, am acting as transcriptionist for Abby Potash, PA-C   I have reviewed the above documentation for accuracy and completeness, and I agree with the above. Abby Potash, PA-C

## 2019-10-02 ENCOUNTER — Ambulatory Visit: Payer: Medicare HMO | Admitting: Family

## 2019-10-05 ENCOUNTER — Other Ambulatory Visit: Payer: Self-pay | Admitting: Family

## 2019-10-17 ENCOUNTER — Ambulatory Visit (INDEPENDENT_AMBULATORY_CARE_PROVIDER_SITE_OTHER): Payer: Medicare HMO | Admitting: Physician Assistant

## 2019-10-17 ENCOUNTER — Encounter (INDEPENDENT_AMBULATORY_CARE_PROVIDER_SITE_OTHER): Payer: Self-pay | Admitting: Physician Assistant

## 2019-10-17 ENCOUNTER — Other Ambulatory Visit: Payer: Self-pay

## 2019-10-17 VITALS — BP 107/72 | HR 63 | Temp 97.6°F | Ht 62.0 in | Wt 191.0 lb

## 2019-10-17 DIAGNOSIS — E559 Vitamin D deficiency, unspecified: Secondary | ICD-10-CM

## 2019-10-17 DIAGNOSIS — Z6835 Body mass index (BMI) 35.0-35.9, adult: Secondary | ICD-10-CM

## 2019-10-17 DIAGNOSIS — E7849 Other hyperlipidemia: Secondary | ICD-10-CM | POA: Diagnosis not present

## 2019-10-18 NOTE — Progress Notes (Signed)
Chief Complaint:   OBESITY Brittney Ruiz is here to discuss her progress with her obesity treatment plan along with follow-up of her obesity related diagnoses. Brittney Ruiz is on the Category 3 Plan and states she is following her eating plan approximately 99% of the time. Brittney Ruiz states she is exercising 0 minutes 0 times per week.  Today's visit was #: 9 Starting weight: 217 lbs Starting date: 06/01/2019 Today's weight: 191 lbs Today's date: 10/17/2019 Total lbs lost to date: 26 Total lbs lost since last in-office visit: 6  Interim History: Hallelujah did very well with weight loss. She is enjoying the plan and not having any issues with it.  Subjective:   Vitamin D deficiency. Brittney Ruiz is on Vitamin D weekly. No nausea, vomiting, or muscle weakness. Last Vitamin D 28.7 on 06/01/2019.  Other hyperlipidemia. Brittney Ruiz is on no medication. No chest pain.  Lab Results  Component Value Date   CHOL 210 (H) 06/01/2019   HDL 34 (L) 06/01/2019   LDLCALC 130 (H) 06/01/2019   LDLDIRECT 141.0 05/02/2018   TRIG 256 (H) 06/01/2019   CHOLHDL 8 05/02/2018   Lab Results  Component Value Date   ALT 33 (H) 06/01/2019   AST 32 06/01/2019   ALKPHOS 88 06/01/2019   BILITOT 0.2 06/01/2019   The 10-year ASCVD risk score [name], et al., 2013) is: 2.6%   Values used to calculate the score:     Age: 54 years     Sex: Female     Is Non-Hispanic African American: No     Diabetic: No     Tobacco smoker: No     Systolic Blood Pressure: [id number] mmHg     Is BP treated: Yes     HDL Cholesterol: 34 mg/dL     Total Cholesterol: 210 mg/dL  Assessment/Plan:   Vitamin D deficiency. Low Vitamin D level contributes to fatigue and are associated with obesity, breast, and colon cancer. She agrees to continue to take Vitamin D and will follow-up for routine testing of Vitamin D, at least 2-3 times per year to avoid over-replacement.  Other hyperlipidemia. Cardiovascular risk and specific lipid/LDL goals reviewed.  We  discussed several lifestyle modifications today and [name] will continue to work on diet, exercise and weight loss efforts. Orders and follow up as documented in patient record.   Counseling Intensive lifestyle modifications are the first line treatment for this issue. . Dietary changes: Increase soluble fiber. Decrease simple carbohydrates. . Exercise changes: Moderate to vigorous-intensity aerobic activity 150 minutes per week if tolerated. . Lipid-lowering medications: see documented in medical record.  Class 2 severe obesity with serious comorbidity and body mass index (BMI) of 35.0 to 35.9 in adult, unspecified obesity type (Brittney Ruiz).  Brittney Ruiz is currently in the action stage of change. As such, her goal is to continue with weight loss efforts. She has agreed to the Category 3 Plan.   Brittney Ruiz will have labs at her next visit in 4 weeks.  Exercise goals: For substantial health benefits, adults should do at least 150 minutes (2 hours and 30 minutes) a week of moderate-intensity, or 75 minutes (1 hour and 15 minutes) a week of vigorous-intensity aerobic physical activity, or an equivalent combination of moderate- and vigorous-intensity aerobic activity. Aerobic activity should be performed in episodes of at least 10 minutes, and preferably, it should be spread throughout the week.  Behavioral modification strategies: meal planning and cooking strategies and planning for success.  Brittney Ruiz has agreed to  follow-up with our clinic in 4 weeks. She was informed of the importance of frequent follow-up visits to maximize her success with intensive lifestyle modifications for her multiple health conditions.   Objective:   Blood pressure 107/72, pulse 63, temperature 97.6 F (36.4 C), temperature source Oral, height 5\' 2"  (1.575 m), weight 191 lb (86.6 kg), SpO2 98 %. Body mass index is 34.93 kg/m.  General: Cooperative, alert, well developed, in no acute distress. HEENT: Conjunctivae and lids  unremarkable. Cardiovascular: Regular rhythm.  Lungs: Normal work of breathing. Neurologic: No focal deficits.   Lab Results  Component Value Date   CREATININE 0.56 (L) 06/01/2019   BUN 15 06/01/2019   NA 142 06/01/2019   K 4.4 06/01/2019   CL 103 06/01/2019   CO2 25 06/01/2019   Lab Results  Component Value Date   ALT 33 (H) 06/01/2019   AST 32 06/01/2019   ALKPHOS 88 06/01/2019   BILITOT 0.2 06/01/2019   Lab Results  Component Value Date   HGBA1C 5.4 06/01/2019   HGBA1C 5.7 05/02/2018   HGBA1C 5.4 04/20/2016   HGBA1C 5.4 05/28/2015   HGBA1C 5.4 10/02/2014   Lab Results  Component Value Date   INSULIN 26.1 (H) 06/01/2019   Lab Results  Component Value Date   TSH 1.810 06/01/2019   Lab Results  Component Value Date   CHOL 210 (H) 06/01/2019   HDL 34 (L) 06/01/2019   LDLCALC 130 (H) 06/01/2019   LDLDIRECT 141.0 05/02/2018   TRIG 256 (H) 06/01/2019   CHOLHDL 8 05/02/2018   Lab Results  Component Value Date   WBC 6.9 06/01/2019   HGB 14.5 06/01/2019   HCT 43.1 06/01/2019   MCV 86 06/01/2019   PLT 175 06/01/2019   Lab Results  Component Value Date   FERRITIN 111 08/12/2012   Obesity Behavioral Intervention Documentation for Insurance:   Approximately 15 minutes were spent on the discussion below.  ASK: We discussed the diagnosis of obesity with Brittney Ruiz today and Brittney Ruiz agreed to give [name] permission to discuss obesity behavioral modification therapy today.  ASSESS: Brittney Ruiz has the diagnosis of obesity and her BMI today is 35.0. Brittney Ruiz is in the action stage of change.   ADVISE: Brittney Ruiz was educated on the multiple health risks of obesity as well as the benefit of weight loss to improve her health. She was advised of the need for long term treatment and the importance of lifestyle modifications to improve her current health and to decrease her risk of future health problems.  AGREE: Multiple dietary modification options and treatment options were discussed and Brittney Ruiz  agreed to follow the recommendations documented in the above note.  ARRANGE: Brittney Ruiz was educated on the importance of frequent visits to treat obesity as outlined per CMS and USPSTF guidelines and agreed to schedule her next follow up appointment today.  Attestation Statements:   Reviewed by clinician on day of visit: allergies, medications, problem list, medical history, surgical history, family history, social history, and previous encounter notes.  IMichaelene Ruiz, am acting as transcriptionist for Abby Potash, PA-C   I have reviewed the above documentation for accuracy and completeness, and I agree with the above. Abby Potash, PA-C

## 2019-11-12 IMAGING — MG DIGITAL SCREENING BILATERAL MAMMOGRAM WITH TOMO AND CAD
8 series · 8 of 24 positions shown · non-contrast
Comparison: Previous exam(s).

CLINICAL DATA: Screening.

EXAM:
DIGITAL SCREENING BILATERAL MAMMOGRAM WITH TOMO AND CAD

[R MLO synth-2D]
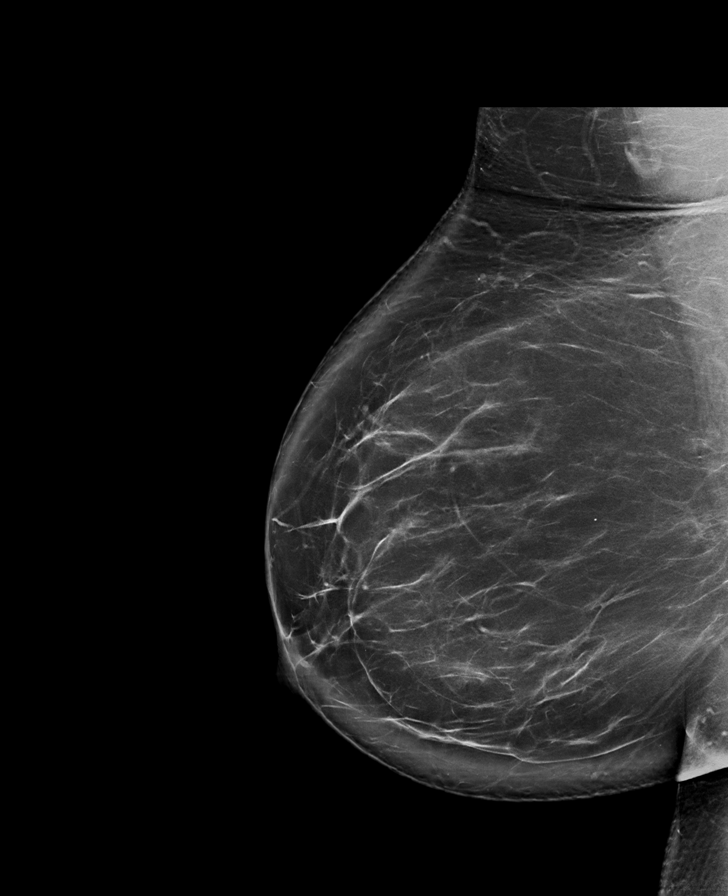

[L MLO synth-2D]
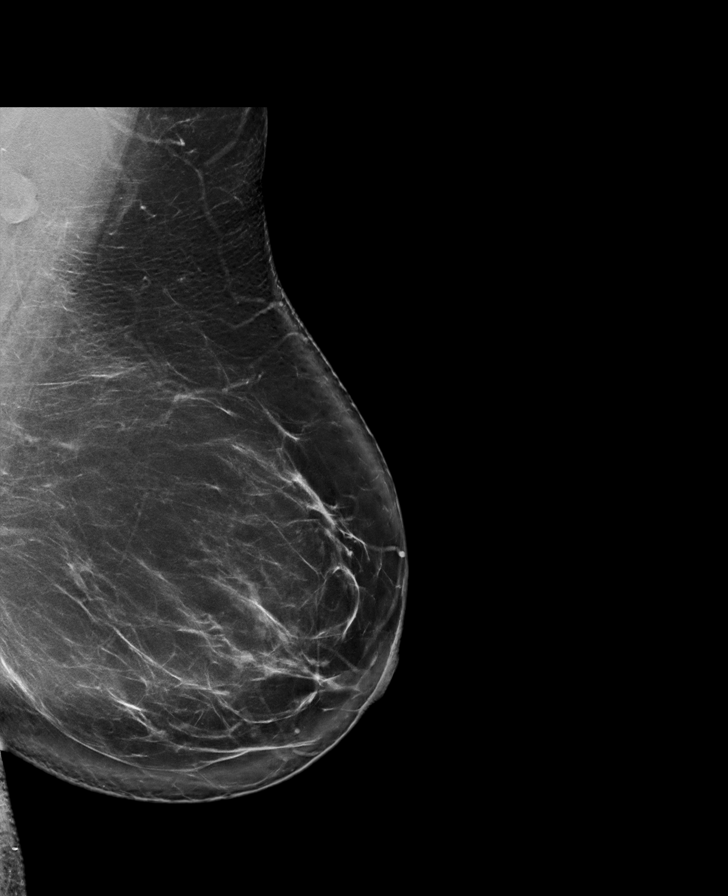

[L CC synth-2D]
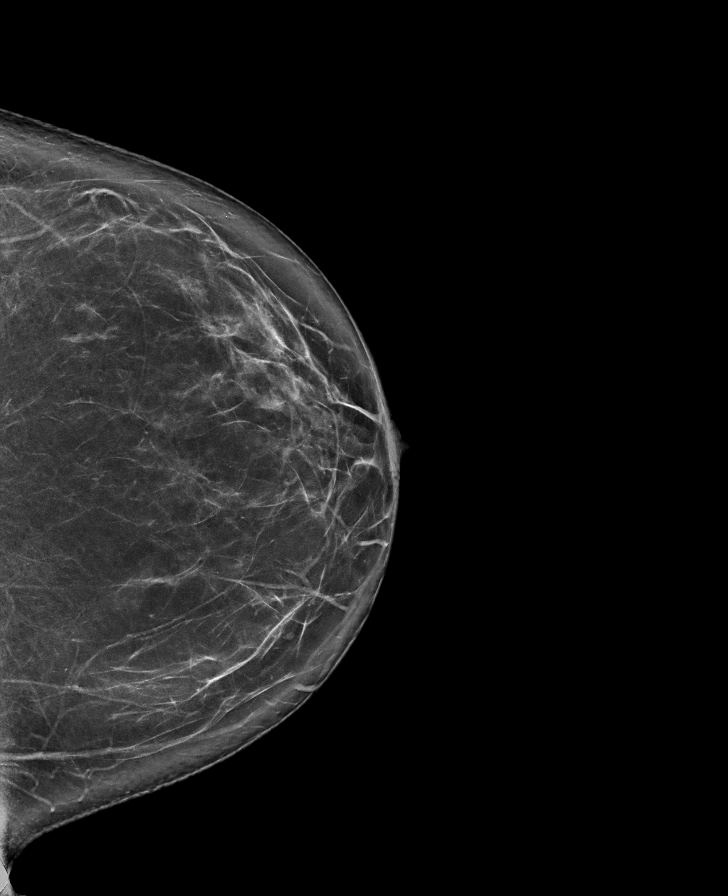

[R CC synth-2D]
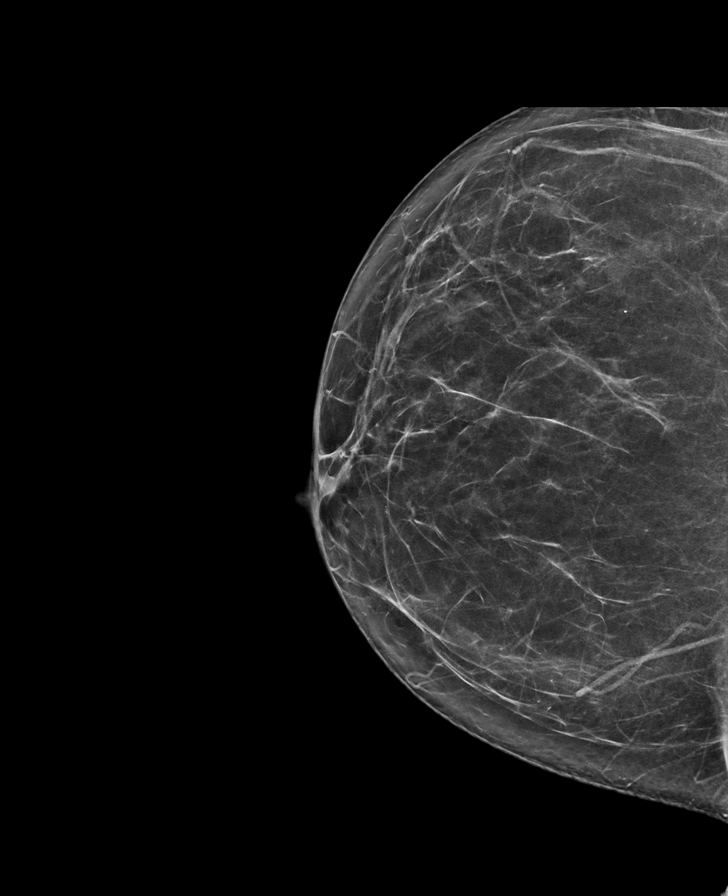

[L MLO tomo · tomo slice 52/103.0]
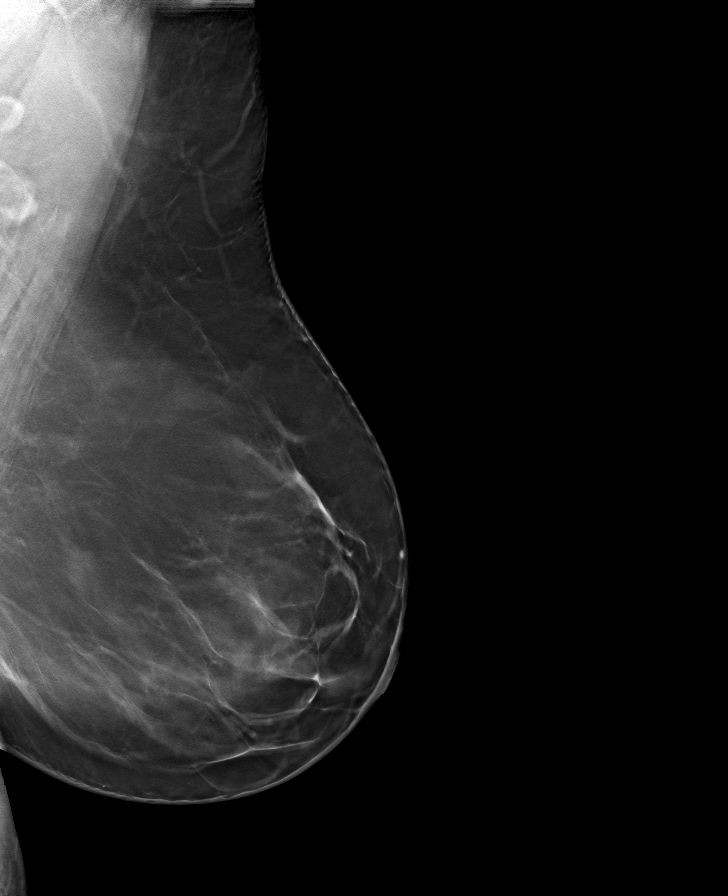

[R CC tomo · tomo slice 41/80.0]
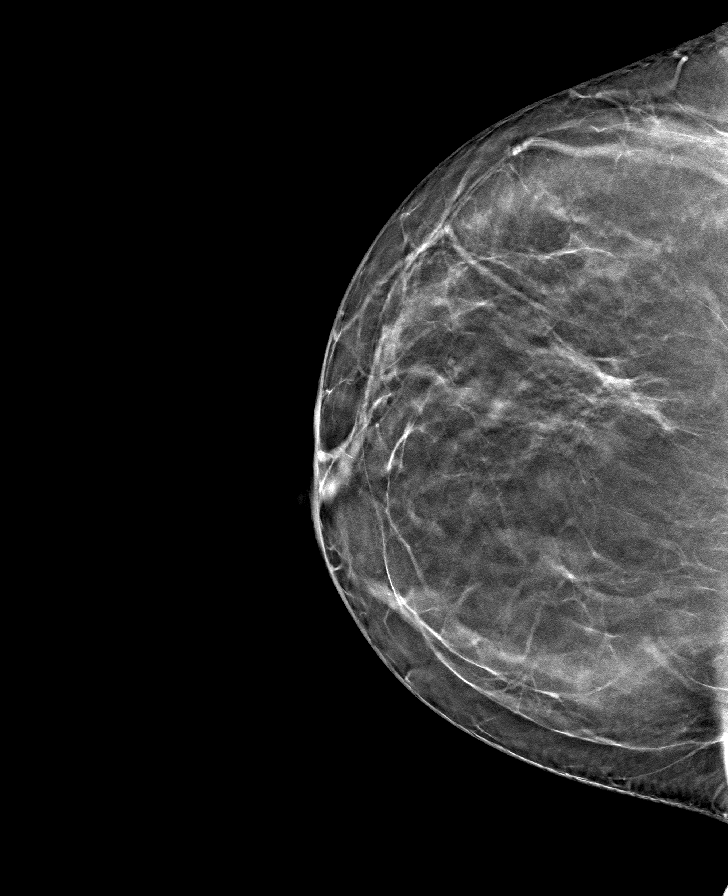

[R MLO tomo · tomo slice 57/113.0]
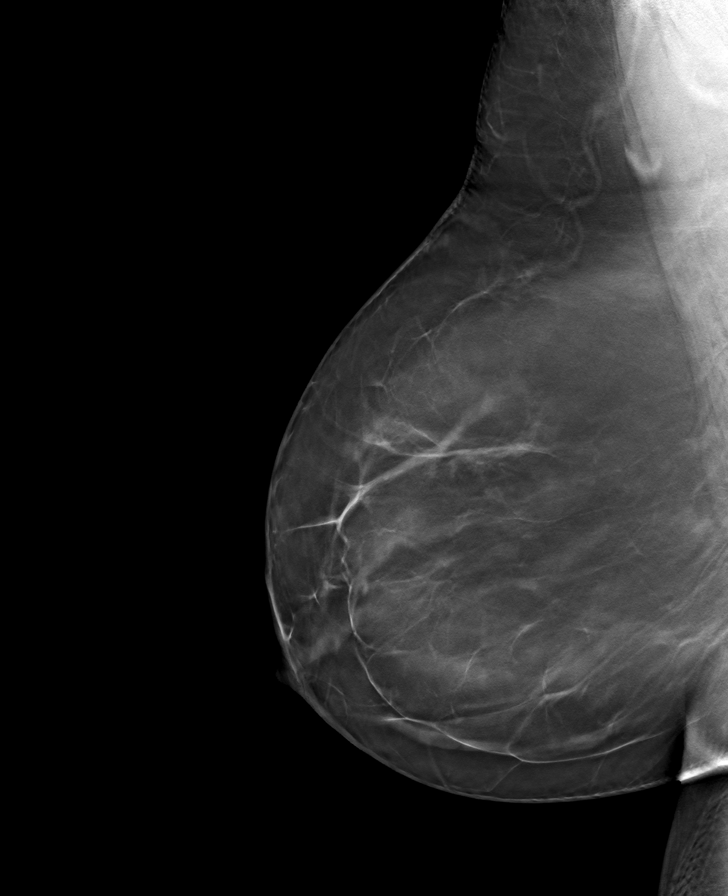

[L CC tomo · tomo slice 45/88.0]
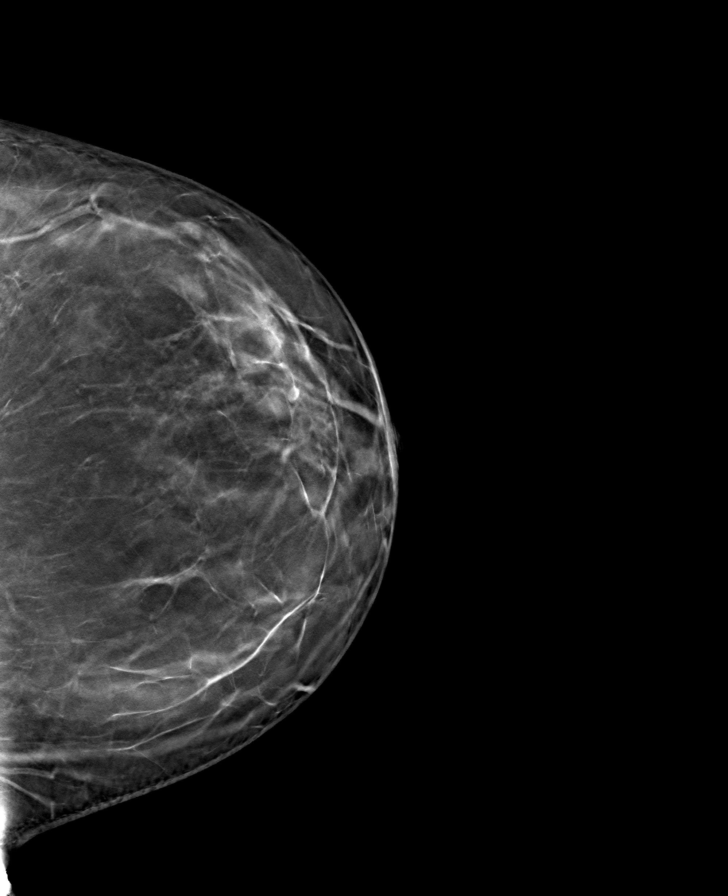

[8 of 24 positions shown; findings below may reference images not displayed]

ACR Breast Density Category b: There are scattered areas of
fibroglandular density.
FINDINGS: There are no findings suspicious for malignancy. Images were
processed with CAD.
IMPRESSION: No mammographic evidence of malignancy. A result letter of this
screening mammogram will be mailed directly to the patient.

RECOMMENDATION:
Screening mammogram in one year. (Code:[id number])

BI-RADS CATEGORY  1: Negative.

## 2019-11-13 DIAGNOSIS — F410 Panic disorder [episodic paroxysmal anxiety] without agoraphobia: Secondary | ICD-10-CM | POA: Diagnosis not present

## 2019-11-13 DIAGNOSIS — F4001 Agoraphobia with panic disorder: Secondary | ICD-10-CM | POA: Diagnosis not present

## 2019-11-13 DIAGNOSIS — F39 Unspecified mood [affective] disorder: Secondary | ICD-10-CM | POA: Diagnosis not present

## 2019-11-14 ENCOUNTER — Ambulatory Visit (INDEPENDENT_AMBULATORY_CARE_PROVIDER_SITE_OTHER): Payer: Medicare HMO | Admitting: Physician Assistant

## 2019-11-14 ENCOUNTER — Other Ambulatory Visit: Payer: Self-pay

## 2019-11-14 ENCOUNTER — Encounter (INDEPENDENT_AMBULATORY_CARE_PROVIDER_SITE_OTHER): Payer: Self-pay | Admitting: Physician Assistant

## 2019-11-14 VITALS — BP 109/75 | HR 64 | Temp 97.8°F | Ht 62.0 in | Wt 187.0 lb

## 2019-11-14 DIAGNOSIS — Z6834 Body mass index (BMI) 34.0-34.9, adult: Secondary | ICD-10-CM | POA: Diagnosis not present

## 2019-11-14 DIAGNOSIS — R739 Hyperglycemia, unspecified: Secondary | ICD-10-CM

## 2019-11-14 DIAGNOSIS — E559 Vitamin D deficiency, unspecified: Secondary | ICD-10-CM | POA: Diagnosis not present

## 2019-11-14 DIAGNOSIS — E669 Obesity, unspecified: Secondary | ICD-10-CM

## 2019-11-14 DIAGNOSIS — E7849 Other hyperlipidemia: Secondary | ICD-10-CM | POA: Diagnosis not present

## 2019-11-14 MED ORDER — VITAMIN D (ERGOCALCIFEROL) 1.25 MG (50000 UNIT) PO CAPS: 50000.0000 [IU] | ORAL_CAPSULE | ORAL | 0 refills | Status: DC

## 2019-11-14 NOTE — Progress Notes (Signed)
Chief Complaint:   OBESITY Brittney Ruiz is here to discuss her progress with her obesity treatment plan along with follow-up of her obesity related diagnoses. Quenna is on the Category 3 Plan and states she is following her eating plan approximately 99% of the time. Huntley states she is exercising 0 minutes 0 times per week.  Today's visit was #: 10 Starting weight: 217 lbs Starting date: 06/01/2019 Today's weight: 187 lbs Today's date: 11/14/2019 Total lbs lost to date: 30 Total lbs lost since last in-office visit: 4  Interim History: Kyliegh reports that she is not weighing her protein at night. She is considering using protein powder as a supplement.  Subjective:   Hyperglycemia. Willy has a history of some elevated blood glucose readings without a diagnosis of diabetes. She denies polyphagia. She is on no medication.  Other hyperlipidemia. Nyx is on no medication. No chest pain.  Lab Results  Component Value Date   CHOL 210 (H) 06/01/2019   HDL 34 (L) 06/01/2019   LDLCALC 130 (H) 06/01/2019   LDLDIRECT 141.0 05/02/2018   TRIG 256 (H) 06/01/2019   CHOLHDL 8 05/02/2018   Lab Results  Component Value Date   ALT 33 (H) 06/01/2019   AST 32 06/01/2019   ALKPHOS 88 06/01/2019   BILITOT 0.2 06/01/2019   The 10-year ASCVD risk score [name], et al., 2013) is: 2.7%   Values used to calculate the score:     Age: 54 years     Sex: Female     Is Non-Hispanic African American: No     Diabetic: No     Tobacco smoker: No     Systolic Blood Pressure: 295 mmHg     Is BP treated: Yes     HDL Cholesterol: 34 mg/dL     Total Cholesterol: 210 mg/dL  Vitamin D deficiency. Jessic is on prescription Vitamin D supplementation. No nausea, vomiting, or muscle weakness.    Ref. Range 06/01/2019 12:35  Vitamin D, 25-Hydroxy Latest Ref Range: 30.0 - 100.0 ng/mL 28.7 (L)   Assessment/Plan:   Hyperglycemia. Fasting labs will be obtained and results with be discussed with Opal Sidles in 2 weeks  at her follow up visit. In the meanwhile Cozy was started on a lower simple carbohydrate diet and will work on weight loss efforts. Comprehensive metabolic panel, Hemoglobin A1c, Insulin, random labs ordered today.  Other hyperlipidemia. Cardiovascular risk and specific lipid/LDL goals reviewed.  We discussed several lifestyle modifications today and Chanita will continue to work on diet, exercise and weight loss efforts. Orders and follow up as documented in patient record. Lipid Panel With LDL/HDL Ratio ordered today.  Counseling Intensive lifestyle modifications are the first line treatment for this issue. . Dietary changes: Increase soluble fiber. Decrease simple carbohydrates. . Exercise changes: Moderate to vigorous-intensity aerobic activity 150 minutes per week if tolerated. . Lipid-lowering medications: see documented in medical record.  Vitamin D deficiency. Low Vitamin D level contributes to fatigue and are associated with obesity, breast, and colon cancer. She was given a refill on her Vitamin D, Ergocalciferol, (DRISDOL) 1.25 MG (50000 UNIT) CAPS capsule every week #4 with 0 refills and VITAMIN D 25 Hydroxy (Vit-D Deficiency, Fractures) level was ordered today.   Class 1 obesity with serious comorbidity and body mass index (BMI) of 34.0 to 34.9 in adult, unspecified obesity type.  Sherre is currently in the action stage of change. As such, her goal is to continue with weight loss efforts. She  has agreed to the Category 3 Plan.   Exercise goals: For substantial health benefits, adults should do at least 150 minutes (2 hours and 30 minutes) a week of moderate-intensity, or 75 minutes (1 hour and 15 minutes) a week of vigorous-intensity aerobic physical activity, or an equivalent combination of moderate- and vigorous-intensity aerobic activity. Aerobic activity should be performed in episodes of at least 10 minutes, and preferably, it should be spread throughout the week.  Behavioral  modification strategies: meal planning and cooking strategies and keeping healthy foods in the home.  Izamar has agreed to follow-up with our clinic in 4 weeks. She was informed of the importance of frequent follow-up visits to maximize her success with intensive lifestyle modifications for her multiple health conditions.   Lenee was informed we would discuss her lab results at her next visit unless there is a critical issue that needs to be addressed sooner. Refugio agreed to keep her next visit at the agreed upon time to discuss these results.  Objective:   Blood pressure 109/75, pulse 64, temperature 97.8 F (36.6 C), temperature source Oral, height 5\' 2"  (1.575 m), weight 187 lb (84.8 kg), SpO2 98 %. Body mass index is 34.2 kg/m.  General: Cooperative, alert, well developed, in no acute distress. HEENT: Conjunctivae and lids unremarkable. Cardiovascular: Regular rhythm.  Lungs: Normal work of breathing. Neurologic: No focal deficits.   Lab Results  Component Value Date   CREATININE 0.56 (L) 06/01/2019   BUN 15 06/01/2019   NA 142 06/01/2019   K 4.4 06/01/2019   CL 103 06/01/2019   CO2 25 06/01/2019   Lab Results  Component Value Date   ALT 33 (H) 06/01/2019   AST 32 06/01/2019   ALKPHOS 88 06/01/2019   BILITOT 0.2 06/01/2019   Lab Results  Component Value Date   HGBA1C 5.4 06/01/2019   HGBA1C 5.7 05/02/2018   HGBA1C 5.4 04/20/2016   HGBA1C 5.4 05/28/2015   HGBA1C 5.4 10/02/2014   Lab Results  Component Value Date   INSULIN 26.1 (H) 06/01/2019   Lab Results  Component Value Date   TSH 1.810 06/01/2019   Lab Results  Component Value Date   CHOL 210 (H) 06/01/2019   HDL 34 (L) 06/01/2019   LDLCALC 130 (H) 06/01/2019   LDLDIRECT 141.0 05/02/2018   TRIG 256 (H) 06/01/2019   CHOLHDL 8 05/02/2018   Lab Results  Component Value Date   WBC 6.9 06/01/2019   HGB 14.5 06/01/2019   HCT 43.1 06/01/2019   MCV 86 06/01/2019   PLT 175 06/01/2019   Lab Results    Component Value Date   FERRITIN 111 08/12/2012   Obesity Behavioral Intervention Documentation for Insurance:   Approximately 15 minutes were spent on the discussion below.  ASK: We discussed the diagnosis of obesity with Ezelle today and [name] agreed to give [name] permission to discuss obesity behavioral modification therapy today.  ASSESS: Ralphine has the diagnosis of obesity and her BMI today is 34.3. Keyanni is in the action stage of change.   ADVISE: Renlee was educated on the multiple health risks of obesity as well as the benefit of weight loss to improve her health. She was advised of the need for long term treatment and the importance of lifestyle modifications to improve her current health and to decrease her risk of future health problems.  AGREE: Multiple dietary modification options and treatment options were discussed and Kirstin agreed to follow the recommendations documented in the above note.  ARRANGE:  Emmary was educated on the importance of frequent visits to treat obesity as outlined per CMS and USPSTF guidelines and agreed to schedule her next follow up appointment today.  Attestation Statements:   Reviewed by clinician on day of visit: allergies, medications, problem list, medical history, surgical history, family history, social history, and previous encounter notes.  IMichaelene Song, am acting as transcriptionist for Abby Potash, PA-C   I have reviewed the above documentation for accuracy and completeness, and I agree with the above. Abby Potash, PA-C

## 2019-11-15 LAB — LIPID PANEL WITH LDL/HDL RATIO
Cholesterol, Total: 217 mg/dL — ABNORMAL HIGH (ref 100–199)
HDL: 30 mg/dL — ABNORMAL LOW (ref >39)
LDL Chol Calc (NIH): 160 mg/dL — ABNORMAL HIGH (ref 0–99)
LDL/HDL Ratio: 5.3 ratio — ABNORMAL HIGH (ref 0.0–3.2)
Triglycerides: 148 mg/dL (ref 0–149)
VLDL Cholesterol Cal: 27 mg/dL (ref 5–40)

## 2019-11-15 LAB — COMPREHENSIVE METABOLIC PANEL
ALT: 34 IU/L — ABNORMAL HIGH (ref 0–32)
AST: 31 IU/L (ref 0–40)
Albumin/Globulin Ratio: 2.3 — ABNORMAL HIGH (ref 1.2–2.2)
Albumin: 4.6 g/dL (ref 3.8–4.9)
Alkaline Phosphatase: 83 IU/L (ref 48–121)
BUN/Creatinine Ratio: 39 — ABNORMAL HIGH (ref 9–23)
BUN: 26 mg/dL — ABNORMAL HIGH (ref 6–24)
Bilirubin Total: 0.4 mg/dL (ref 0.0–1.2)
CO2: 24 mmol/L (ref 20–29)
Calcium: 9.4 mg/dL (ref 8.7–10.2)
Chloride: 101 mmol/L (ref 96–106)
Creatinine, Ser: 0.67 mg/dL (ref 0.57–1.00)
GFR calc Af Amer: 117 mL/min/{1.73_m2} (ref >59)
GFR calc non Af Amer: 101 mL/min/{1.73_m2} (ref >59)
Globulin, Total: 2 g/dL (ref 1.5–4.5)
Glucose: 110 mg/dL — ABNORMAL HIGH (ref 65–99)
Potassium: 4.6 mmol/L (ref 3.5–5.2)
Sodium: 141 mmol/L (ref 134–144)
Total Protein: 6.6 g/dL (ref 6.0–8.5)

## 2019-11-15 LAB — VITAMIN D 25 HYDROXY (VIT D DEFICIENCY, FRACTURES): Vit D, 25-Hydroxy: 54.5 ng/mL (ref 30.0–100.0)

## 2019-11-15 LAB — INSULIN, RANDOM: INSULIN: 8.5 u[IU]/mL (ref 2.6–24.9)

## 2019-11-15 LAB — HEMOGLOBIN A1C
Est. average glucose Bld gHb Est-mCnc: 108 mg/dL
Hgb A1c MFr Bld: 5.4 % (ref 4.8–5.6)

## 2019-12-01 ENCOUNTER — Telehealth: Payer: Self-pay | Admitting: Family

## 2019-12-01 DIAGNOSIS — M545 Low back pain, unspecified: Secondary | ICD-10-CM

## 2019-12-01 DIAGNOSIS — M25572 Pain in left ankle and joints of left foot: Secondary | ICD-10-CM | POA: Diagnosis not present

## 2019-12-01 DIAGNOSIS — G8929 Other chronic pain: Secondary | ICD-10-CM

## 2019-12-01 DIAGNOSIS — M25571 Pain in right ankle and joints of right foot: Secondary | ICD-10-CM | POA: Diagnosis not present

## 2019-12-01 NOTE — Telephone Encounter (Signed)
Patient needs a referral to [facility] for her fees and back. Insurance will cover if Vidal Schwalbe puts a referral in.

## 2019-12-05 NOTE — Telephone Encounter (Signed)
Call Pt Referral to [facility]; she can call them now

## 2019-12-05 NOTE — Telephone Encounter (Signed)
Spoke with pt and she stated that the appt has already been made.

## 2019-12-06 ENCOUNTER — Other Ambulatory Visit: Payer: Self-pay | Admitting: Family

## 2019-12-06 DIAGNOSIS — M25572 Pain in left ankle and joints of left foot: Secondary | ICD-10-CM | POA: Diagnosis not present

## 2019-12-06 DIAGNOSIS — M545 Low back pain: Secondary | ICD-10-CM | POA: Diagnosis not present

## 2019-12-06 DIAGNOSIS — M25571 Pain in right ankle and joints of right foot: Secondary | ICD-10-CM | POA: Diagnosis not present

## 2019-12-06 NOTE — Telephone Encounter (Signed)
Call pt There is no expiration on referral  Please advise to make an appt with [facility]

## 2019-12-06 NOTE — Telephone Encounter (Signed)
Pt called and stated that Brittney Ruiz need to extend the time of referral to  October So insurance would pay for it

## 2019-12-08 NOTE — Telephone Encounter (Signed)
Left a message to call back.

## 2019-12-12 ENCOUNTER — Ambulatory Visit (INDEPENDENT_AMBULATORY_CARE_PROVIDER_SITE_OTHER): Payer: Medicare HMO | Admitting: Physician Assistant

## 2019-12-12 ENCOUNTER — Other Ambulatory Visit: Payer: Self-pay

## 2019-12-12 ENCOUNTER — Encounter (INDEPENDENT_AMBULATORY_CARE_PROVIDER_SITE_OTHER): Payer: Self-pay | Admitting: Physician Assistant

## 2019-12-12 VITALS — BP 119/77 | HR 55 | Temp 97.9°F | Ht 62.0 in | Wt 187.0 lb

## 2019-12-12 DIAGNOSIS — Z6834 Body mass index (BMI) 34.0-34.9, adult: Secondary | ICD-10-CM | POA: Diagnosis not present

## 2019-12-12 DIAGNOSIS — E559 Vitamin D deficiency, unspecified: Secondary | ICD-10-CM | POA: Diagnosis not present

## 2019-12-12 DIAGNOSIS — M545 Low back pain: Secondary | ICD-10-CM | POA: Diagnosis not present

## 2019-12-12 DIAGNOSIS — E7849 Other hyperlipidemia: Secondary | ICD-10-CM

## 2019-12-12 DIAGNOSIS — E669 Obesity, unspecified: Secondary | ICD-10-CM | POA: Diagnosis not present

## 2019-12-12 DIAGNOSIS — M25572 Pain in left ankle and joints of left foot: Secondary | ICD-10-CM | POA: Diagnosis not present

## 2019-12-12 DIAGNOSIS — M25571 Pain in right ankle and joints of right foot: Secondary | ICD-10-CM | POA: Diagnosis not present

## 2019-12-12 NOTE — Telephone Encounter (Signed)
Called and spoke to [facility]. Brittney Ruiz states that her referral was written and will end before her treatment will be completed. The referral only lasts for 4 visits but her Physical therapist has her scheduled until the end of October. She asks that the referral be extended to last until the end of October as she has PT 2 times a week until then.

## 2019-12-12 NOTE — Telephone Encounter (Signed)
Pt returned your call.  

## 2019-12-12 NOTE — Progress Notes (Signed)
Chief Complaint:   OBESITY Brittney Ruiz is here to discuss her progress with her obesity treatment plan along with follow-up of her obesity related diagnoses. Brittney Ruiz is on the Category 3 Plan and states she is following her eating plan approximately 98% of the time. Brittney Ruiz states she is doing PT for back and feet.   Today's visit was #: 11 Starting weight: 217 lbs Starting date: 06/01/2019 Today's weight: 187 lbs Today's date: 12/12/2019 Total lbs lost to date: 30 Total lbs lost since last in-office visit: 0  Interim History: Brittney Ruiz reports that she has been under a lot of stress recently. She believes she is not eating enough and is not journaling regularly.  Subjective:   Vitamin D deficiency. Brittney Ruiz is on OTC Vitamin D supplementation. No nausea, vomiting, or muscle weakness.    Ref. Range 11/14/2019 14:42  Vitamin D, 25-Hydroxy Latest Ref Range: 30.0 - 100.0 ng/mL 54.5   Other hyperlipidemia. Brittney Ruiz is on no medication currently. Last HDL worsened from 34 to 30; total cholesterol worsened from 210 to 217; LDL worsened from 130 to 160.   Lab Results  Component Value Date   CHOL 217 (H) 11/14/2019   HDL 30 (L) 11/14/2019   LDLCALC 160 (H) 11/14/2019   LDLDIRECT 141.0 05/02/2018   TRIG 148 11/14/2019   CHOLHDL 8 05/02/2018   Lab Results  Component Value Date   ALT 34 (H) 11/14/2019   AST 31 11/14/2019   ALKPHOS 83 11/14/2019   BILITOT 0.4 11/14/2019   The 10-year ASCVD risk score [name], et al., 2013) is: 3.8%   Values used to calculate the score:     Age: 54 years     Sex: Female     Is Non-Hispanic African American: No     Diabetic: No     Tobacco smoker: No     Systolic Blood Pressure: 295 mmHg     Is BP treated: Yes     HDL Cholesterol: 30 mg/dL     Total Cholesterol: 217 mg/dL  Assessment/Plan:   Vitamin D deficiency. Low Vitamin D level contributes to fatigue and are associated with obesity, breast, and colon cancer. She agrees to continue to take  Vitamin D as directed and will follow-up for routine testing of Vitamin D, at least 2-3 times per year to avoid over-replacement.  Other hyperlipidemia. Cardiovascular risk and specific lipid/LDL goals reviewed.  We discussed several lifestyle modifications today and Brittney Ruiz will continue to work on diet, exercise and weight loss efforts. Orders and follow up as documented in patient record. Brittney Ruiz will follow-up with her PCP as scheduled and as directed.  Counseling Intensive lifestyle modifications are the first line treatment for this issue.  Dietary changes: Increase soluble fiber. Decrease simple carbohydrates.  Exercise changes: Moderate to vigorous-intensity aerobic activity 150 minutes per week if tolerated.  Lipid-lowering medications: see documented in medical record.  Class 1 obesity with serious comorbidity and body mass index (BMI) of 34.0 to 34.9 in adult, unspecified obesity type.  Brittney Ruiz is currently in the action stage of change. As such, her goal is to continue with weight loss efforts. She has agreed to keeping a food journal and adhering to recommended goals of 1500 calories and 95 grams of protein daily.   Exercise goals: For substantial health benefits, adults should do at least 150 minutes (2 hours and 30 minutes) a week of moderate-intensity, or 75 minutes (1 hour and 15 minutes) a week of vigorous-intensity aerobic physical  activity, or an equivalent combination of moderate- and vigorous-intensity aerobic activity. Aerobic activity should be performed in episodes of at least 10 minutes, and preferably, it should be spread throughout the week.  Behavioral modification strategies: planning for success and keeping a strict food journal.  Brittney Ruiz has agreed to follow-up with our clinic in 4 weeks. She was informed of the importance of frequent follow-up visits to maximize her success with intensive lifestyle modifications for her multiple health conditions.   Objective:   Blood  pressure 119/77, pulse 55, temperature 97.9 F (36.6 C), temperature source Oral, height 5\' 2"  (1.575 m), weight 187 lb (84.8 kg), SpO2 95 %. Body mass index is 34.2 kg/m.  General: Cooperative, alert, well developed, in no acute distress. HEENT: Conjunctivae and lids unremarkable. Cardiovascular: Regular rhythm.  Lungs: Normal work of breathing. Neurologic: No focal deficits.   Lab Results  Component Value Date   CREATININE 0.67 11/14/2019   BUN 26 (H) 11/14/2019   NA 141 11/14/2019   K 4.6 11/14/2019   CL 101 11/14/2019   CO2 24 11/14/2019   Lab Results  Component Value Date   ALT 34 (H) 11/14/2019   AST 31 11/14/2019   ALKPHOS 83 11/14/2019   BILITOT 0.4 11/14/2019   Lab Results  Component Value Date   HGBA1C 5.4 11/14/2019   HGBA1C 5.4 06/01/2019   HGBA1C 5.7 05/02/2018   HGBA1C 5.4 04/20/2016   HGBA1C 5.4 05/28/2015   Lab Results  Component Value Date   INSULIN 8.5 11/14/2019   INSULIN 26.1 (H) 06/01/2019   Lab Results  Component Value Date   TSH 1.810 06/01/2019   Lab Results  Component Value Date   CHOL 217 (H) 11/14/2019   HDL 30 (L) 11/14/2019   LDLCALC 160 (H) 11/14/2019   LDLDIRECT 141.0 05/02/2018   TRIG 148 11/14/2019   CHOLHDL 8 05/02/2018   Lab Results  Component Value Date   WBC 6.9 06/01/2019   HGB 14.5 06/01/2019   HCT 43.1 06/01/2019   MCV 86 06/01/2019   PLT 175 06/01/2019   Lab Results  Component Value Date   FERRITIN 111 08/12/2012   Obesity Behavioral Intervention Documentation for Insurance:   Approximately 15 minutes were spent on the discussion below.  ASK: We discussed the diagnosis of obesity with Brittney Ruiz today and Brittney Ruiz agreed to give [name] permission to discuss obesity behavioral modification therapy today.  ASSESS: Brittney Ruiz has the diagnosis of obesity and her BMI today is 34.3. Noelie is in the action stage of change.   ADVISE: [name] was educated on the multiple health risks of obesity as well as the benefit of weight loss  to improve her health. She was advised of the need for long term treatment and the importance of lifestyle modifications to improve her current health and to decrease her risk of future health problems.  AGREE: Multiple dietary modification options and treatment options were discussed and Kiesha agreed to follow the recommendations documented in the above note.  ARRANGE: Tenlee was educated on the importance of frequent visits to treat obesity as outlined per CMS and USPSTF guidelines and agreed to schedule her next follow up appointment today.  Attestation Statements:   Reviewed by clinician on day of visit: allergies, medications, problem list, medical history, surgical history, family history, social history, and previous encounter notes.  IMichaelene Song, am acting as transcriptionist for Abby Potash, PA-C   I have reviewed the above documentation for accuracy and completeness, and I agree with the above. -  Abby Potash, PA-C

## 2019-12-12 NOTE — Telephone Encounter (Signed)
Left a message to call back.

## 2019-12-13 ENCOUNTER — Other Ambulatory Visit: Payer: Self-pay | Admitting: Family

## 2019-12-13 ENCOUNTER — Telehealth: Payer: Self-pay | Admitting: Family

## 2019-12-13 DIAGNOSIS — G8929 Other chronic pain: Secondary | ICD-10-CM

## 2019-12-13 NOTE — Telephone Encounter (Signed)
Call pt PT order placed

## 2019-12-13 NOTE — Telephone Encounter (Signed)
Left message to call back  

## 2019-12-13 NOTE — Telephone Encounter (Signed)
Pt was returning your call.

## 2019-12-13 NOTE — Telephone Encounter (Signed)
Patient verbalized understanding and had no further questions.  

## 2019-12-19 DIAGNOSIS — M25572 Pain in left ankle and joints of left foot: Secondary | ICD-10-CM | POA: Diagnosis not present

## 2019-12-19 DIAGNOSIS — M25571 Pain in right ankle and joints of right foot: Secondary | ICD-10-CM | POA: Diagnosis not present

## 2019-12-19 DIAGNOSIS — M545 Low back pain: Secondary | ICD-10-CM | POA: Diagnosis not present

## 2019-12-21 DIAGNOSIS — M25571 Pain in right ankle and joints of right foot: Secondary | ICD-10-CM | POA: Diagnosis not present

## 2019-12-21 DIAGNOSIS — M545 Low back pain: Secondary | ICD-10-CM | POA: Diagnosis not present

## 2019-12-21 DIAGNOSIS — M25572 Pain in left ankle and joints of left foot: Secondary | ICD-10-CM | POA: Diagnosis not present

## 2019-12-22 ENCOUNTER — Telehealth: Payer: Self-pay | Admitting: Family

## 2019-12-22 DIAGNOSIS — M79673 Pain in unspecified foot: Secondary | ICD-10-CM

## 2019-12-22 NOTE — Telephone Encounter (Signed)
Pt said her physical therapist at [facility] said pt needs [place] socks but she need a prescription sent to [facility].  Also need shoes for plantar fasciitis and need a prescription to get fleet feet.

## 2019-12-25 ENCOUNTER — Telehealth: Payer: Self-pay | Admitting: Family

## 2019-12-25 NOTE — Telephone Encounter (Signed)
Rejection Reason - Patient did not respond" [facility], PA - [place] said 12 days ago

## 2019-12-25 NOTE — Telephone Encounter (Signed)
Call pt She would need a consult with podiatry to eval for plantar fasciitis I have placed Let us know if you dont hear back within a week in regards to an appointment being scheduled.   Ms.

## 2019-12-26 NOTE — Telephone Encounter (Signed)
Message sent to patient notifying of referral.

## 2019-12-29 NOTE — Telephone Encounter (Signed)
Pt called about strassburg socks. Pt states that she was unaware that she needs to see podiatry for that. Please advise. Theatre manager # [phone]

## 2020-01-02 DIAGNOSIS — M25571 Pain in right ankle and joints of right foot: Secondary | ICD-10-CM | POA: Diagnosis not present

## 2020-01-02 DIAGNOSIS — M25572 Pain in left ankle and joints of left foot: Secondary | ICD-10-CM | POA: Diagnosis not present

## 2020-01-02 DIAGNOSIS — M545 Low back pain: Secondary | ICD-10-CM | POA: Diagnosis not present

## 2020-01-02 NOTE — Telephone Encounter (Signed)
Call pt Explain that I am unaware of strassburg socks and have placed ref to podiatry for eval, recommendations

## 2020-01-02 NOTE — Telephone Encounter (Signed)
Called and spoke with patient. Brittney Ruiz states that she has already been to podiatry and they are who sent her to her PT for further evaluation. She states that her PT claims that they need her Primary care physician to send in the order for the Str[place] socks. [name] asks if we can send in the prescription.

## 2020-01-03 ENCOUNTER — Other Ambulatory Visit: Payer: Self-pay

## 2020-01-03 NOTE — Telephone Encounter (Signed)
Prescription for Str[place] socks has been written and signed by PCP. Placed in the front for patient pick up.

## 2020-01-03 NOTE — Telephone Encounter (Signed)
Please write rx for the below with patient info and I will sign

## 2020-01-03 NOTE — Telephone Encounter (Signed)
Filled a paper order for the Brittney Ruiz and given to [employer]. Patient has been called to come and pick up paper order.

## 2020-01-04 DIAGNOSIS — M25571 Pain in right ankle and joints of right foot: Secondary | ICD-10-CM | POA: Diagnosis not present

## 2020-01-04 DIAGNOSIS — M25572 Pain in left ankle and joints of left foot: Secondary | ICD-10-CM | POA: Diagnosis not present

## 2020-01-04 DIAGNOSIS — M545 Low back pain: Secondary | ICD-10-CM | POA: Diagnosis not present

## 2020-01-09 ENCOUNTER — Encounter (INDEPENDENT_AMBULATORY_CARE_PROVIDER_SITE_OTHER): Payer: Self-pay | Admitting: Physician Assistant

## 2020-01-09 ENCOUNTER — Other Ambulatory Visit: Payer: Self-pay

## 2020-01-09 ENCOUNTER — Ambulatory Visit (INDEPENDENT_AMBULATORY_CARE_PROVIDER_SITE_OTHER): Payer: Medicare HMO | Admitting: Physician Assistant

## 2020-01-09 VITALS — BP 115/80 | HR 65 | Temp 97.9°F | Ht 62.0 in | Wt 183.0 lb

## 2020-01-09 DIAGNOSIS — E669 Obesity, unspecified: Secondary | ICD-10-CM | POA: Diagnosis not present

## 2020-01-09 DIAGNOSIS — M25571 Pain in right ankle and joints of right foot: Secondary | ICD-10-CM | POA: Diagnosis not present

## 2020-01-09 DIAGNOSIS — E7849 Other hyperlipidemia: Secondary | ICD-10-CM

## 2020-01-09 DIAGNOSIS — E559 Vitamin D deficiency, unspecified: Secondary | ICD-10-CM

## 2020-01-09 DIAGNOSIS — M25572 Pain in left ankle and joints of left foot: Secondary | ICD-10-CM | POA: Diagnosis not present

## 2020-01-09 DIAGNOSIS — Z6833 Body mass index (BMI) 33.0-33.9, adult: Secondary | ICD-10-CM

## 2020-01-09 DIAGNOSIS — M545 Low back pain: Secondary | ICD-10-CM | POA: Diagnosis not present

## 2020-01-09 NOTE — Progress Notes (Signed)
Chief Complaint:   OBESITY Brittney Ruiz is here to discuss her progress with her obesity treatment plan along with follow-up of her obesity related diagnoses. Brittney Ruiz is on the Category 3 Plan and states she is following her eating plan approximately 98% of the time. Brittney Ruiz states she is doing PT 30 minutes 3 times per week and walking 10 minutes 3 times per week.  Today's visit was #: 12 Starting weight: 217 lbs Starting date: 06/01/2019 Today's weight: 183 lbs Today's date: 01/09/2020 Total lbs lost to date: 34 Total lbs lost since last in-office visit: 4  Interim History: Brittney Ruiz reports that she has followed the plan well and is making good decisions when eating out.  Subjective:   Other hyperlipidemia. Brittney Ruiz is allergic to statins and is only taking Zetia once weekly because she "feels terrible" taking it. Lipid panel is worsening. She denies chest pain.  Lab Results  Component Value Date   CHOL 217 (H) 11/14/2019   HDL 30 (L) 11/14/2019   LDLCALC 160 (H) 11/14/2019   LDLDIRECT 141.0 05/02/2018   TRIG 148 11/14/2019   CHOLHDL 8 05/02/2018   Lab Results  Component Value Date   ALT 34 (H) 11/14/2019   AST 31 11/14/2019   ALKPHOS 83 11/14/2019   BILITOT 0.4 11/14/2019   The 10-year ASCVD risk score [name], et al., 2013) is: 3.5%   Values used to calculate the score:     Age: 54 years     Sex: Female     Is Non-Hispanic African American: No     Diabetic: No     Tobacco smoker: No     Systolic Blood Pressure: 338 mmHg     Is BP treated: Yes     HDL Cholesterol: 30 mg/dL     Total Cholesterol: 217 mg/dL  Vitamin D deficiency. Brittney Ruiz is on Vitamin D supplementation daily. Last Vitamin D level was at goal.   Ref. Range 11/14/2019 14:42  Vitamin D, 25-Hydroxy Latest Ref Range: 30.0 - 100.0 ng/mL 54.5   Assessment/Plan:   Other hyperlipidemia. Cardiovascular risk and specific lipid/LDL goals reviewed.  We discussed several lifestyle modifications today and Brittney Ruiz will  continue to work on diet, exercise and weight loss efforts. Orders and follow up as documented in patient record. AMB Referral to [facility].  Counseling Intensive lifestyle modifications are the first line treatment for this issue.  Dietary changes: Increase soluble fiber. Decrease simple carbohydrates.  Exercise changes: Moderate to vigorous-intensity aerobic activity 150 minutes per week if tolerated.  Lipid-lowering medications: see documented in medical record.   Vitamin D deficiency. Low Vitamin D level contributes to fatigue and are associated with obesity, breast, and colon cancer. She agrees to continue to take Vitamin D as directed and will follow-up for routine testing of Vitamin D, at least 2-3 times per year to avoid over-replacement.  Class 1 obesity with serious comorbidity and body mass index (BMI) of 33.0 to 33.9 in adult, unspecified obesity type.  Brittney Ruiz is currently in the action stage of change. As such, her goal is to continue with weight loss efforts. She has agreed to the Category 3 Plan.   Exercise goals: For substantial health benefits, adults should do at least 150 minutes (2 hours and 30 minutes) a week of moderate-intensity, or 75 minutes (1 hour and 15 minutes) a week of vigorous-intensity aerobic physical activity, or an equivalent combination of moderate- and vigorous-intensity aerobic activity. Aerobic activity should be performed  in episodes of at least 10 minutes, and preferably, it should be spread throughout the week.  Behavioral modification strategies: avoiding temptations and planning for success.  Brittney Ruiz has agreed to follow-up with our clinic in 4 weeks. She was informed of the importance of frequent follow-up visits to maximize her success with intensive lifestyle modifications for her multiple health conditions.   Objective:   Blood pressure 115/80, pulse 65, temperature 97.9 F (36.6 C), temperature source Oral, height 5\' 2"   (1.575 m), weight 183 lb (83 kg), SpO2 98 %. Body mass index is 33.47 kg/m.  General: Cooperative, alert, well developed, in no acute distress. HEENT: Conjunctivae and lids unremarkable. Cardiovascular: Regular rhythm.  Lungs: Normal work of breathing. Neurologic: No focal deficits.   Lab Results  Component Value Date   CREATININE 0.67 11/14/2019   BUN 26 (H) 11/14/2019   NA 141 11/14/2019   K 4.6 11/14/2019   CL 101 11/14/2019   CO2 24 11/14/2019   Lab Results  Component Value Date   ALT 34 (H) 11/14/2019   AST 31 11/14/2019   ALKPHOS 83 11/14/2019   BILITOT 0.4 11/14/2019   Lab Results  Component Value Date   HGBA1C 5.4 11/14/2019   HGBA1C 5.4 06/01/2019   HGBA1C 5.7 05/02/2018   HGBA1C 5.4 04/20/2016   HGBA1C 5.4 05/28/2015   Lab Results  Component Value Date   INSULIN 8.5 11/14/2019   INSULIN 26.1 (H) 06/01/2019   Lab Results  Component Value Date   TSH 1.810 06/01/2019   Lab Results  Component Value Date   CHOL 217 (H) 11/14/2019   HDL 30 (L) 11/14/2019   LDLCALC 160 (H) 11/14/2019   LDLDIRECT 141.0 05/02/2018   TRIG 148 11/14/2019   CHOLHDL 8 05/02/2018   Lab Results  Component Value Date   WBC 6.9 06/01/2019   HGB 14.5 06/01/2019   HCT 43.1 06/01/2019   MCV 86 06/01/2019   PLT 175 06/01/2019   Lab Results  Component Value Date   FERRITIN 111 08/12/2012   Attestation Statements:   Reviewed by clinician on day of visit: allergies, medications, problem list, medical history, surgical history, family history, social history, and previous encounter notes.  Time spent on visit including pre-visit chart review and post-visit charting and care was 32 minutes.   IMichaelene Song, am acting as transcriptionist for Abby Potash, PA-C   I have reviewed the above documentation for accuracy and completeness, and I agree with the above. Abby Potash, PA-C

## 2020-01-11 DIAGNOSIS — M545 Low back pain: Secondary | ICD-10-CM | POA: Diagnosis not present

## 2020-01-11 DIAGNOSIS — M25571 Pain in right ankle and joints of right foot: Secondary | ICD-10-CM | POA: Diagnosis not present

## 2020-01-11 DIAGNOSIS — M25572 Pain in left ankle and joints of left foot: Secondary | ICD-10-CM | POA: Diagnosis not present

## 2020-01-15 DIAGNOSIS — H5319 Other subjective visual disturbances: Secondary | ICD-10-CM | POA: Diagnosis not present

## 2020-01-16 DIAGNOSIS — M25572 Pain in left ankle and joints of left foot: Secondary | ICD-10-CM | POA: Diagnosis not present

## 2020-01-16 DIAGNOSIS — M545 Low back pain: Secondary | ICD-10-CM | POA: Diagnosis not present

## 2020-01-16 DIAGNOSIS — M25571 Pain in right ankle and joints of right foot: Secondary | ICD-10-CM | POA: Diagnosis not present

## 2020-01-29 ENCOUNTER — Encounter: Payer: Self-pay | Admitting: Family

## 2020-02-01 ENCOUNTER — Ambulatory Visit (INDEPENDENT_AMBULATORY_CARE_PROVIDER_SITE_OTHER): Payer: Medicare HMO | Admitting: Physician Assistant

## 2020-02-05 ENCOUNTER — Ambulatory Visit: Payer: Medicare HMO | Admitting: Family

## 2020-02-13 ENCOUNTER — Other Ambulatory Visit: Payer: Self-pay | Admitting: Family

## 2020-02-14 DIAGNOSIS — F39 Unspecified mood [affective] disorder: Secondary | ICD-10-CM | POA: Diagnosis not present

## 2020-02-14 DIAGNOSIS — F410 Panic disorder [episodic paroxysmal anxiety] without agoraphobia: Secondary | ICD-10-CM | POA: Diagnosis not present

## 2020-02-14 DIAGNOSIS — F4001 Agoraphobia with panic disorder: Secondary | ICD-10-CM | POA: Diagnosis not present

## 2020-02-29 ENCOUNTER — Encounter: Payer: Self-pay | Admitting: Internal Medicine

## 2020-02-29 ENCOUNTER — Telehealth (INDEPENDENT_AMBULATORY_CARE_PROVIDER_SITE_OTHER): Payer: Medicare HMO | Admitting: Internal Medicine

## 2020-02-29 VITALS — BP 115/76 | HR 66 | Wt 182.0 lb

## 2020-02-29 DIAGNOSIS — T466X5A Adverse effect of antihyperlipidemic and antiarteriosclerotic drugs, initial encounter: Secondary | ICD-10-CM | POA: Diagnosis not present

## 2020-02-29 DIAGNOSIS — E782 Mixed hyperlipidemia: Secondary | ICD-10-CM | POA: Diagnosis not present

## 2020-02-29 DIAGNOSIS — Z789 Other specified health status: Secondary | ICD-10-CM

## 2020-02-29 DIAGNOSIS — M791 Myalgia, unspecified site: Secondary | ICD-10-CM | POA: Diagnosis not present

## 2020-02-29 DIAGNOSIS — Z6841 Body Mass Index (BMI) 40.0 and over, adult: Secondary | ICD-10-CM | POA: Diagnosis not present

## 2020-02-29 NOTE — Progress Notes (Signed)
Virtual Visit via Video Note   This visit type was conducted due to national recommendations for restrictions regarding the COVID-19 Pandemic (e.g. social distancing) in an effort to limit this patient's exposure and mitigate transmission in our community.  Due to her co-morbid illnesses, this patient is at least at moderate risk for complications without adequate follow up.  This format is felt to be most appropriate for this patient at this time.  All issues noted in this document were discussed and addressed.  A limited physical exam was performed with this format.  Please refer to the patient's chart for her consent to telehealth for [facility].      Date:  02/29/2020   ID:  Brittney Ruiz, DOB 04/27/1966, MRN [id number] The patient was identified using 2 identifiers.  Evaluation Performed:  New Patient Evaluation  Patient Location:  [place] [address]  Provider location:   [address] [place], [place] [address]  PCP:  Burnard Hawthorne, FNP  Cardiologist:  No primary care provider on file. Electrophysiologist:  None   Chief Complaint:  Dyslipidemia  History of Present Illness:    Brittney Ruiz is a 54 y.o. female who presents via audio/video conferencing for a telehealth visit today.  This is a 54 year old female kindly referred for evaluation management of dyslipidemia.  She reports this is a longstanding issue which is basically related to weight.  She has had issues with anxiety and depression and was on antidepressant medications and gained over 100 pounds.  She is been working to lose out and is managed to lose about 50 pounds.  She has a family history of heart disease she says in her father and grandfather and has COPD and other cardiovascular risk factors.  She has been seen by my partner Dr. Fletcher Anon about released from cardiology due to some mitral valve disease which apparently improved.  She did have a CT coronary angiogram in 2020 which  showed no evidence of coronary artery disease and 0 coronary calcium.  I share that information with her again today and the importance of the very high negative predictive value of a study like that.  Overall I find that reassuring despite the fact that her cholesterol has been elevated.  In general the lipids have been slowly coming down, but she cannot tolerate any medications.  She cannot take any statins due to side effects.  She cannot take ezetimibe on a daily basis and currently only takes a quarter of a tablet 1 or 2 times a week.  She has over 41 different allergies to medications, foods and other products.  She reports being fairly sedentary due to issues with plantar fasciitis.  She continues to work with the weight loss clinic regarding dietary approaches to weight loss.  Her most recent lipid profile showed total cholesterol 217, triglycerides 148, HDL 30 and LDL of 160.  The patient does not have symptoms concerning for COVID-19 infection (fever, chills, cough, or new SHORTNESS OF BREATH).    Prior CV studies:   The following studies were reviewed today:  Chart reviewed  PMHx:  Past Medical History:  Diagnosis Date  . Abnormal Pap smear of cervix    h/o LEEP  . Allergy   . Angio-edema   . Anxiety   . Asthma   . Bladder leak   . Cancer ([place])    melanoma 2003  . Chest pain   . Constipation   . COPD (chronic obstructive pulmonary  disease) ([place])   . Depression   . Diarrhea   . Food allergy   . Heart valve problem   . Heartburn   . History of swelling of feet   . Hyperlipidemia   . Hypertension   . IBS (irritable bowel syndrome)   . Joint pain   . Low back pain   . Melanoma ([place])    right foot s/p lymph node removed right groin  . Obesity (BMI 30-39.9)   . Palpitation   . Prediabetes   . Shortness of breath   . Urticaria   . Vaginal delivery    X 2    Past Surgical History:  Procedure Laterality Date  . BLADDER SUSPENSION  07/2017  . COLONOSCOPY WITH  PROPOFOL N/A 07/01/2017   Procedure: COLONOSCOPY WITH PROPOFOL;  Surgeon: Virgel Manifold, MD;  Location: [facility] ENDOSCOPY;  Service: Endoscopy;  Laterality: N/A;  . ESOPHAGOGASTRODUODENOSCOPY (EGD) WITH PROPOFOL N/A 07/01/2017   Procedure: ESOPHAGOGASTRODUODENOSCOPY (EGD) WITH PROPOFOL;  Surgeon: Virgel Manifold, MD;  Location: [facility] ENDOSCOPY;  Service: Endoscopy;  Laterality: N/A;  . LEEP  1995  . LEEP    . PARTIAL HYSTERECTOMY  08/2017   s/p partial hysterectomy - per patient HAS cervix, uterus, fallopian tubes; ovaries INTACT  . SKIN CANCER EXCISION     melanoma  . TONSILLECTOMY      FAMHx:  Family History  Problem Relation Age of Onset  . Hyperlipidemia Mother   . Hypertension Mother   . Diabetes Mother   . Breast cancer Mother 16  . Stroke Mother   . Cancer Mother   . Cancer Father        lung  . Depression Father   . Diabetes Father   . Hypertension Father   . Hyperlipidemia Father   . Sleep apnea Father   . Alcoholism Father   . Hyperlipidemia Sister   . Hypertension Sister   . Migraines Sister   . Hypertension Brother   . Hyperlipidemia Brother   . Migraines Brother   . Cancer Maternal Grandmother        brain  . Depression Maternal Grandfather   . Heart disease Maternal Grandfather   . Cancer Paternal Grandfather        unknown    SOCHx:   reports that she quit smoking about 11 years ago. Her smoking use included cigarettes. She has a 20.00 pack-year smoking history. She has never used smokeless tobacco. She reports that she does not drink alcohol and does not use drugs.  ALLERGIES:  Allergies  Allergen Reactions  . Pork Allergy Swelling    Other reaction(s): OTHER  . Pork-Derived Products Swelling    Other reaction(s): OTHER Other reaction(s): OTHER Other reaction(s): OTHER   . Sulfur Swelling, Hives and Other (See Comments)  . Aciphex [Rabeprazole Sodium]   . Antivert [Meclizine Hcl]     Causes dizziness and hives  . Buspar [Buspirone  Hcl]     hives  . Cat Hair Extract     Other reaction(s): SHORTNESS OF BREATH  . Chlorpheniramine-Pseudoeph   . Chlorpheniramine-Pseudoeph Other (See Comments)    Messes with her anxiety  . Chocolate Flavor   . Codeine   . Crestor [Rosuvastatin Calcium]     Could not tolerate   . Cymbalta [Duloxetine Hcl]   . Duloxetine Other (See Comments)    Other reaction(s): HIVES Other reaction(s): HIVES   . Effexor Xr [Venlafaxine Hydrochloride]     hives  . Epinephrine   .  Erythromycin   . Escitalopram Oxalate   . Lamictal [Lamotrigine]     hives  . Levofloxacin Swelling    Levofloxacin  . Lipitor [Atorvastatin]     Multiple symptoms  . Meclizine Other (See Comments)  . Mometasone Furoate   . Mometasone Furoate   . Monosodium Glutamate     Other reaction(s): OTHER Other reaction(s): OTHER   . Neurontin [Gabapentin]     Hives   . Other     Other reaction(s): HIVES Other reaction(s): HIVES Other reaction(s): HIVES Other reaction(s): HIVES   . Prednisone   . Shellfish Allergy     Other reaction(s): HIVES Other reaction(s): HIVES  . Sudafed [Pseudoephedrine Hcl]     Causes severe anxiety  . Sulfa Antibiotics Other (See Comments)    Other reaction(s): HIVES Other reaction(s): HIVES   . Sulfasalazine Other (See Comments)  . Valium   . Wellbutrin [Bupropion Hcl]   . Wixela Inhub [Fluticasone-Salmeterol]     Increased heart rate  . Xanax Xr [Alprazolam]     Hives and anxiety  . Zetia [Ezetimibe]     body aches on full dose  . Alfalfa Rash  . Brompheniramine-Phenylephrine Palpitations    hypersensitive    . Buspirone Rash and Other (See Comments)  . Tape Rash    MEDS:  Current Meds  Medication Sig  . ACCU-CHEK SOFTCLIX LANCETS lancets TEST BLOOD SUGAR UP TO FOUR TIMES DAILY AS DIRECTED  . ACETAMINOPHEN PO Take by mouth as needed.    Marland Kitchen albuterol (PROVENTIL HFA;VENTOLIN HFA) 108 (90 Base) MCG/ACT inhaler Inhale 2 puffs into the lungs every 6 (six) hours as  needed.  Marland Kitchen albuterol (PROVENTIL) (2.5 MG/3ML) 0.083% nebulizer solution Take 3 mLs (2.5 mg total) by nebulization every 6 (six) hours as needed for wheezing or shortness of breath.  Marland Kitchen aspirin EC 81 MG tablet Take 81 mg by mouth daily.  . B Complex Vitamins (BL VITAMIN B COMPLEX PO) +Folic acid qd.  . Blood Glucose Monitoring Suppl (ACCU-CHEK AVIVA PLUS) w/Device KIT USE UP TO FOUR TIMES DAILY AS DIRECTED  . Cholecalciferol (VITAMIN D-3) 25 MCG (1000 UT) CAPS Take 1 capsule by mouth daily.  . clorazepate (TRANXENE) 7.5 MG tablet Take 7.5 mg by mouth 3 (three) times daily.  Marland Kitchen ezetimibe (ZETIA) 10 MG tablet Take 2.5 mg by mouth once a week. Tuesday morning  . fexofenadine (ALLEGRA) 60 MG tablet Take 60 mg by mouth daily.  . fluticasone (FLONASE) 50 MCG/ACT nasal spray USE 2 SPRAYS INTO BOTH NOSTRILS DAILY.  Marland Kitchen glucose blood (ACCU-CHEK AVIVA PLUS) test strip USE UP TO FOUR TIMES DAILY AS DIRECTED  . ibuprofen (ADVIL) 200 MG tablet Take 200 mg by mouth every 6 (six) hours as needed.  . Multiple Vitamin (MULTIVITAMIN) tablet Take 1 tablet by mouth daily.  . Omega-3 Fatty Acids (FISH OIL) 1000 MG CAPS Take 1 capsule by mouth daily.  Marland Kitchen omeprazole (PRILOSEC) 20 MG capsule TAKE 1 CAPSULE EVERY DAY  . polyethylene glycol powder (MIRALAX) powder Take 1 Container by mouth daily.  . propranolol (INDERAL) 20 MG tablet Take 1 tablet (20 mg total) by mouth 3 (three) times daily.  . psyllium (METAMUCIL) 58.6 % powder Take 1 packet by mouth daily.  . sertraline (ZOLOFT) 100 MG tablet TAKE 1+1/2 TABLETS BY MOUTH DAILY AS DIRECTED (Patient taking differently: Take 112.5 mg by mouth in the morning. )  . sertraline (ZOLOFT) 25 MG tablet Take 25 mg by mouth every evening.   Marland Kitchen Spacer/Aero Chamber [employer]  MISC 1 puff by Does not apply route as needed.     ROS: Pertinent items noted in HPI and remainder of comprehensive ROS otherwise negative.  Labs/Other Tests and Data Reviewed:    Recent Labs: 06/01/2019:  Hemoglobin 14.5; Platelets 175; TSH 1.810 11/14/2019: ALT 34; BUN 26; Creatinine, Ser 0.67; Potassium 4.6; Sodium 141   Recent Lipid Panel Lab Results  Component Value Date/Time   CHOL 217 (H) 11/14/2019 02:42 PM   TRIG 148 11/14/2019 02:42 PM   HDL 30 (L) 11/14/2019 02:42 PM   CHOLHDL 8 05/02/2018 02:57 PM   LDLCALC 160 (H) 11/14/2019 02:42 PM   LDLDIRECT 141.0 05/02/2018 02:57 PM    Wt Readings from Last 3 Encounters:  02/29/20 182 lb (82.6 kg)  01/09/20 183 lb (83 kg)  12/12/19 187 lb (84.8 kg)     Exam:    Vital Signs:  BP 115/76   Pulse 66   Wt 182 lb (82.6 kg)   LMP  (LMP Unknown) Comment: urine Preg negative   BMI 33.29 kg/m    General appearance: alert, no distress and mildly obese Lungs: No audible wheezing Abdomen: Mildly overweight Extremities: extremities normal, atraumatic, no cyanosis or edema Neurologic: Grossly normal  ASSESSMENT & PLAN:    1. Mixed dyslipidemia 2. No CAD with a 0 coronary calcium score (2020) 3. Family history of heart disease in her father and grandfather 23. Obesity with weight loss efforts in place 5. Statin and ezetimibe intolerance  Ms. Tappan has a mixed dyslipidemia but fortunately had no evidence of coronary artery disease or coronary calcium by CT in 2020.  This may suggest favorable genetics.  I suspect a lot of her lipids are related to diet and we have seen a progressive decline in triglycerides over the past couple years with weight loss.  LDL which is calculated is accordingly gone up however total cholesterol stayed fairly stable in the low 200s.  She has been variably able to take a small amount of ezetimibe which I encouraged her to continue to try to do.  She may also trial over-the-counter red yeast rice.  She reports having her lipids checked every 3 months by her PCP and I would recommend that her PCP continue with that.  Not a candidate for any advanced therapies given her lack of coronary disease.  Thanks for the kind  referral.  Follow-up with me as needed.  COVID-19 Education: The signs and symptoms of COVID-19 were discussed with the patient and how to seek care for testing (follow up with PCP or arrange E-visit).  The importance of social distancing was discussed today.  Patient Risk:   After full review of this patients clinical status, I feel that they are at least moderate risk at this time.  Time:   Today, I have spent 25 minutes with the patient with telehealth technology discussing dyslipidemia, dietary approaches to cholesterol management, over-the-counter recommendations.     Medication Adjustments/Labs and Tests Ordered: Current medicines are reviewed at length with the patient today.  Concerns regarding medicines are outlined above.   Tests Ordered: No orders of the defined types were placed in this encounter.   Medication Changes: No orders of the defined types were placed in this encounter.   Disposition:  prn  [name] Casino, MD, FACC, [place] Director of the Advanced Lipid Disorders &  Cardiovascular Risk Reduction Clinic Diplomate of the American Board of Clinical Lipidology Attending Cardiologist  Direct Dial:  [phone]  Fax: [phone]  Website:  [email or web address]  [name] Casino, MD  02/29/2020 8:41 AM

## 2020-02-29 NOTE — Patient Instructions (Signed)
Medication Instructions:  Dr. Debara Pickett recommends trying over-the-counter RED YEAST RICE  *If you need a refill on your cardiac medications before your next appointment, please call your pharmacy*   Follow-Up: At [facility], you and your health needs are our priority.  As part of our continuing mission to provide you with exceptional heart care, we have created designated Provider Care Teams.  These Care Teams include your primary Cardiologist (physician) and Advanced Practice Providers (APPs -  Physician Assistants and Nurse Practitioners) who all work together to provide you with the care you need, when you need it.  We recommend signing up for the patient portal called "MyChart".  Sign up information is provided on this After Visit Summary.  MyChart is used to connect with patients for Virtual Visits (Telemedicine).  Patients are able to view lab/test results, encounter notes, upcoming appointments, etc.  Non-urgent messages can be sent to your provider as well.   To learn more about what you can do with MyChart, go to [email or web address].    Your next appointment:   AS NEEDED with Dr. Debara Pickett for lipid management

## 2020-03-04 ENCOUNTER — Ambulatory Visit (INDEPENDENT_AMBULATORY_CARE_PROVIDER_SITE_OTHER): Payer: Medicare HMO | Admitting: Physician Assistant

## 2020-03-06 ENCOUNTER — Encounter: Payer: Self-pay | Admitting: Family

## 2020-03-06 ENCOUNTER — Other Ambulatory Visit (HOSPITAL_COMMUNITY)
Admission: RE | Admit: 2020-03-06 | Discharge: 2020-03-06 | Disposition: A | Discharge status: Home / Self Care | Payer: Medicare HMO | Source: Ambulatory Visit | Attending: Family | Admitting: Family

## 2020-03-06 ENCOUNTER — Other Ambulatory Visit: Payer: Self-pay

## 2020-03-06 ENCOUNTER — Ambulatory Visit (INDEPENDENT_AMBULATORY_CARE_PROVIDER_SITE_OTHER): Payer: Medicare HMO | Admitting: Family

## 2020-03-06 VITALS — BP 102/68 | HR 66 | Temp 97.7°F | Ht 62.0 in | Wt 184.4 lb

## 2020-03-06 DIAGNOSIS — Z01419 Encounter for gynecological examination (general) (routine) without abnormal findings: Secondary | ICD-10-CM | POA: Insufficient documentation

## 2020-03-06 DIAGNOSIS — Z1151 Encounter for screening for human papillomavirus (HPV): Secondary | ICD-10-CM | POA: Insufficient documentation

## 2020-03-06 DIAGNOSIS — Z1159 Encounter for screening for other viral diseases: Secondary | ICD-10-CM

## 2020-03-06 DIAGNOSIS — R3915 Urgency of urination: Secondary | ICD-10-CM | POA: Diagnosis not present

## 2020-03-06 DIAGNOSIS — Z0000 Encounter for general adult medical examination without abnormal findings: Secondary | ICD-10-CM | POA: Insufficient documentation

## 2020-03-06 NOTE — Assessment & Plan Note (Signed)
Chronic. No evidence of prolapse today. Symptom bothersome for patient and we agreed referral back to uro- gyn whom she has seen in the past.

## 2020-03-06 NOTE — Progress Notes (Signed)
Subjective:    Patient ID: Brittney Ruiz, female    DOB: 1966/03/21, 54 y.o.   MRN: [id number]  CC: Brittney Ruiz is a 54 y.o. female who presents today for physical exam.    HPI: Feels well today.   Has lost total 55lbs and goal is to loose 100 lbs.  Continues to complain of urinary urgency, chronic, unchanged. She has h/o bladder surgery. She wears pads daily. She feels that something may be protruding from vagina and concerned for prolapse.  HLD- improved. takes 1/8 tablet of zetia once per week, if she take more than that, she experiences myalgia from her 'head to her toe.' Compliant with red yeast extract.      Consult with Follows with Lindell Noe , psychiatry; follows with her every 3 months.  Consult with Dr Debara Pickett 02/29/20 in regards to mixed hyperlipidemia  10 year ASCVD risk 2.2%.   Colorectal Cancer Screening: UTD , repeat in 5 years from 2019 Breast Cancer Screening: Mammogram UTD Cervical Cancer Screening: due as transformation zone absent. H/o LEEP, partial hysterectomy. Has ovaries. She is unsure about her cervix and if she still has.    Bone Health screening/DEXA for 65+: No increased fracture risk. Defer screening at this time.  Lung Cancer Screening: former smoker , 2009  family history of AAA with PGM. Marland Kitchen No family h/o of cerebral aneursym.        Tetanus - utd        Pneumococcal - Complete Hepatitis C screening - Candidate for, consents  Labs: Scre[facility], 3 months ago Exercise: Walks for exercise, limited by plantar fasciitis.  Alcohol use:  none Follows with dermatology for h/o melanoma.  HISTORY:  Past Medical History:  Diagnosis Date  . Abnormal Pap smear of cervix    h/o LEEP  . Allergy   . Angio-edema   . Anxiety   . Asthma   . Bladder leak   . Cancer ([place])    melanoma 2003  . Chest pain   . Constipation   . COPD (chronic obstructive pulmonary disease) ([place])   . Depression   . Diarrhea   . Food allergy     . Heart valve problem   . Heartburn   . History of swelling of feet   . Hyperlipidemia   . Hypertension   . IBS (irritable bowel syndrome)   . Joint pain   . Low back pain   . Melanoma ([place])    right foot s/p lymph node removed right groin  . Obesity (BMI 30-39.9)   . Palpitation   . Prediabetes   . Shortness of breath   . Urticaria   . Vaginal delivery    X 2    Past Surgical History:  Procedure Laterality Date  . BLADDER SUSPENSION  07/2017  . COLONOSCOPY WITH PROPOFOL N/A 07/01/2017   Procedure: COLONOSCOPY WITH PROPOFOL;  Surgeon: Virgel Manifold, MD;  Location: [facility] ENDOSCOPY;  Service: Endoscopy;  Laterality: N/A;  . ESOPHAGOGASTRODUODENOSCOPY (EGD) WITH PROPOFOL N/A 07/01/2017   Procedure: ESOPHAGOGASTRODUODENOSCOPY (EGD) WITH PROPOFOL;  Surgeon: Virgel Manifold, MD;  Location: [facility] ENDOSCOPY;  Service: Endoscopy;  Laterality: N/A;  . LEEP  1995  . LEEP    . PARTIAL HYSTERECTOMY  08/2017   s/p partial hysterectomy - per patient HAS cervix, uterus, fallopian tubes; ovaries INTACT. NO CERVIX on exam 03/06/20  . SKIN CANCER EXCISION     melanoma  . TONSILLECTOMY  Family History  Problem Relation Age of Onset  . Hyperlipidemia Mother   . Hypertension Mother   . Diabetes Mother   . Breast cancer Mother 39  . Stroke Mother   . Cancer Mother   . Cancer Father        lung  . Depression Father   . Diabetes Father   . Hypertension Father   . Hyperlipidemia Father   . Sleep apnea Father   . Alcoholism Father   . Hyperlipidemia Sister   . Hypertension Sister   . Migraines Sister   . Hypertension Brother   . Hyperlipidemia Brother   . Migraines Brother   . Cancer Maternal Grandmother        glioblastoma  . Depression Maternal Grandfather   . Heart disease Maternal Grandfather   . Cancer Paternal Grandfather        unknown  . AAA (abdominal aortic aneurysm) Paternal Grandfather       ALLERGIES: Pork allergy, Pork-derived products, Sulfur, Aciphex  [rabeprazole sodium], Antivert [meclizine hcl], Buspar [buspirone hcl], Cat hair extract, Chlorpheniramine-pseudoeph, Chlorpheniramine-pseudoeph, Chocolate flavor, Codeine, Crestor [rosuvastatin calcium], Cymbalta [duloxetine hcl], Duloxetine, Effexor xr [venlafaxine hydrochloride], Epinephrine, Erythromycin, Escitalopram oxalate, Lamictal [lamotrigine], Levofloxacin, Lipitor [atorvastatin], Meclizine, Mometasone furoate, Mometasone furoate, Monosodium glutamate, Neurontin [gabapentin], Other, Prednisone, Shellfish allergy, Sudafed [pseudoephedrine hcl], Sulfa antibiotics, Sulfasalazine, Valium, Wellbutrin [bupropion hcl], Wixela inhub [fluticasone-salmeterol], Xanax xr [alprazolam], Zetia [ezetimibe], Alfalfa, Brompheniramine-phenylephrine, Buspirone, and Tape  Current Outpatient Medications on File Prior to Visit  Medication Sig Dispense Refill  . ACCU-CHEK SOFTCLIX LANCETS lancets TEST BLOOD SUGAR UP TO FOUR TIMES DAILY AS DIRECTED 400 each 0  . ACETAMINOPHEN PO Take by mouth as needed.      Marland Kitchen albuterol (PROVENTIL HFA;VENTOLIN HFA) 108 (90 Base) MCG/ACT inhaler Inhale 2 puffs into the lungs every 6 (six) hours as needed. 1 Inhaler 1  . albuterol (PROVENTIL) (2.5 MG/3ML) 0.083% nebulizer solution Take 3 mLs (2.5 mg total) by nebulization every 6 (six) hours as needed for wheezing or shortness of breath. 30 mL 3  . aspirin EC 81 MG tablet Take 81 mg by mouth daily.    . B Complex Vitamins (BL VITAMIN B COMPLEX PO) +Folic acid qd.    . Blood Glucose Monitoring Suppl (ACCU-CHEK AVIVA PLUS) w/Device KIT USE UP TO FOUR TIMES DAILY AS DIRECTED 1 kit 0  . Cholecalciferol (VITAMIN D-3) 25 MCG (1000 UT) CAPS Take 1 capsule by mouth daily.    . clorazepate (TRANXENE) 7.5 MG tablet Take 7.5 mg by mouth 3 (three) times daily.    Marland Kitchen ezetimibe (ZETIA) 10 MG tablet Take 2.5 mg by mouth once a week. Tuesday morning    . fexofenadine (ALLEGRA) 60 MG tablet Take 60 mg by mouth daily.    . fluticasone (FLONASE) 50  MCG/ACT nasal spray USE 2 SPRAYS INTO BOTH NOSTRILS DAILY. 48 g 2  . glucose blood (ACCU-CHEK AVIVA PLUS) test strip USE UP TO FOUR TIMES DAILY AS DIRECTED 100 each 11  . ibuprofen (ADVIL) 200 MG tablet Take 200 mg by mouth every 6 (six) hours as needed.    . Multiple Vitamin (MULTIVITAMIN) tablet Take 1 tablet by mouth daily.    . Omega-3 Fatty Acids (FISH OIL) 1000 MG CAPS Take 1 capsule by mouth daily.    Marland Kitchen omeprazole (PRILOSEC) 20 MG capsule TAKE 1 CAPSULE EVERY DAY 90 capsule 0  . polyethylene glycol powder (MIRALAX) powder Take 1 Container by mouth daily.    . propranolol (INDERAL) 20 MG tablet Take  1 tablet (20 mg total) by mouth 3 (three) times daily. 90 tablet 3  . psyllium (METAMUCIL) 58.6 % powder Take 1 packet by mouth daily.    . sertraline (ZOLOFT) 100 MG tablet TAKE 1+1/2 TABLETS BY MOUTH DAILY AS DIRECTED (Patient taking differently: Take 112.5 mg by mouth in the morning. ) 45 tablet 3  . sertraline (ZOLOFT) 25 MG tablet Take 25 mg by mouth every evening.     Marland Kitchen Spacer/Aero Chamber Mouthpiece MISC 1 puff by Does not apply route as needed. 1 each 1  . Red Yeast Rice Extract (RED YEAST RICE PO) Take by mouth daily. (Patient not taking: Reported on 03/06/2020)     No current facility-administered medications on file prior to visit.    Social History   Tobacco Use  . Smoking status: Former Smoker    Packs/day: 1.00    Years: 20.00    Pack years: 20.00    Types: Cigarettes    Quit date: 03/09/2008    Years since quitting: 12.0  . Smokeless tobacco: Never Used  Vaping Use  . Vaping Use: Never used  Substance Use Topics  . Alcohol use: No  . Drug use: No    Review of Systems  Constitutional: Negative for chills, fever and unexpected weight change.  HENT: Negative for congestion.   Respiratory: Negative for cough.   Cardiovascular: Negative for chest pain, palpitations and leg swelling.  Gastrointestinal: Negative for nausea and vomiting.  Genitourinary: Positive for  urgency. Negative for difficulty urinating, pelvic pain, vaginal bleeding, vaginal discharge and vaginal pain.  Musculoskeletal: Negative for arthralgias and myalgias.  Skin: Negative for rash.  Neurological: Negative for headaches.  Hematological: Negative for adenopathy.  Psychiatric/Behavioral: Negative for confusion.      Objective:    BP 102/68   Pulse 66   Temp 97.7 F (36.5 C)   Ht 5' 2"  (1.575 m)   Wt 184 lb 6.4 oz (83.6 kg)   LMP  (LMP Unknown)   SpO2 98%   BMI 33.73 kg/m   BP Readings from Last 3 Encounters:  03/06/20 102/68  02/29/20 115/76  01/09/20 115/80   Wt Readings from Last 3 Encounters:  03/06/20 184 lb 6.4 oz (83.6 kg)  02/29/20 182 lb (82.6 kg)  01/09/20 183 lb (83 kg)    Physical Exam Vitals reviewed.  Constitutional:      Appearance: She is well-developed.  Eyes:     Conjunctiva/sclera: Conjunctivae normal.  Neck:     Thyroid: No thyroid mass or thyromegaly.  Cardiovascular:     Rate and Rhythm: Normal rate and regular rhythm.     Pulses: Normal pulses.     Heart sounds: Normal heart sounds.  Pulmonary:     Effort: Pulmonary effort is normal.     Breath sounds: Normal breath sounds. No wheezing, rhonchi or rales.  Chest:     Breasts: Breasts are symmetrical.        Right: No inverted nipple, mass, nipple discharge, skin change or tenderness.        Left: No inverted nipple, mass, nipple discharge, skin change or tenderness.  Genitourinary:    Adnexa:        Right: No mass, tenderness or fullness.         Left: No mass, tenderness or fullness.       Comments: Pap performed of vaginal canal, no cervix on exam. No CMT. Unable to appreciated ovaries.No bladder prolapse appreciated. Lymphadenopathy:     Head:  Right side of head: No submental, submandibular, tonsillar, preauricular, posterior auricular or occipital adenopathy.     Left side of head: No submental, submandibular, tonsillar, preauricular, posterior auricular or occipital  adenopathy.     Cervical:     Right cervical: No superficial, deep or posterior cervical adenopathy.    Left cervical: No superficial, deep or posterior cervical adenopathy.     Upper Body:     Right upper body: No pectoral adenopathy.     Left upper body: No pectoral adenopathy.  Skin:    General: Skin is warm and dry.  Neurological:     Mental Status: She is alert.  Psychiatric:        Speech: Speech normal.        Behavior: Behavior normal.        Thought Content: Thought content normal.        Assessment & Plan:   Problem List Items Addressed This Visit      Other   Routine physical examination - Primary    CBE and pap smear of vagina performed. No cervix on exam. Patient will schedule mammogram. Ordered CT Chest due to smoking history.Per Vascular Society Guidelines, advised screen at age 24 due to smoking history and family history. Patient called [employer] and called the office 03/07/20 to let me know [employer] approves therefore we will proceed with screen as patient's preference as opposed to wait until she is 54 years old. Congratulated patient on tremendous efforts to loose weight.       Relevant Orders   Hepatitis C antibody (Completed)   CT CHEST LUNG CA SCREEN LOW DOSE W/O CM   MM 3D SCREEN BREAST BILATERAL   Cytology - PAP (Completed)   Urinary urgency    Chronic. No evidence of prolapse today. Symptom bothersome for patient and we agreed referral back to uro- gyn whom she has seen in the past.      Relevant Orders   Ambulatory referral to Urogynecology   Urinalysis, Routine w reflex microscopic (Completed)    Other Visit Diagnoses    Encounter for hepatitis C screening test for low risk patient       Relevant Orders   Hepatitis C antibody (Completed)       I am having Railey F. Gruenberg maintain her [employer], ACETAMINOPHEN PO, clorazepate, multivitamin, sertraline, propranolol, albuterol, psyllium, B Complex Vitamins (BL VITAMIN B COMPLEX  PO), aspirin EC, Fish Oil, polyethylene glycol powder, albuterol, Accu-Chek Aviva Plus, Accu-Chek Softclix Lancets, sertraline, fluticasone, Accu-Chek Aviva Plus, ibuprofen, fexofenadine, Vitamin D-3, omeprazole, ezetimibe, and Red Yeast Rice Extract (RED YEAST RICE PO).   No orders of the defined types were placed in this encounter.   Return precautions given.   Risks, benefits, and alternatives of the medications and treatment plan prescribed today were discussed, and patient expressed understanding.   Education regarding symptom management and diagnosis given to patient on AVS.   Continue to follow with Burnard Hawthorne, FNP for routine health maintenance.   Aggie Hacker and I agreed with plan.   Mable Paris, FNP

## 2020-03-06 NOTE — Assessment & Plan Note (Addendum)
CBE and pap smear of vagina performed. No cervix on exam. Patient will schedule mammogram. Ordered CT Chest due to smoking history.Per Vascular Society Guidelines, advised screen at age 54 due to smoking history and family history. Patient called [employer] and called the office 03/07/20 to let me know [employer] approves therefore we will proceed with screen as patient's preference as opposed to wait until she is 54 years old. Congratulated patient on tremendous efforts to loose weight.

## 2020-03-06 NOTE — Patient Instructions (Addendum)
:et  Me know if [employer] will cover abdominal aortic aneurysm, guidelines are to wait until 54 years old.  Referral back to [facility], CT lung cancer screen  Let us know if you dont hear back within a week in regards to an appointment being scheduled.   Please call  and schedule your 3D mammogram, bone density scan as discussed.   Brittney  Ruiz, Brittney Ruiz  Nice to see you!   Health Maintenance for Postmenopausal Women Menopause is a normal process in which your ability to get pregnant comes to an end. This process happens slowly over many months or years, usually between the ages of 72 and 37. Menopause is complete when you have missed your menstrual periods for 12 months. It is important to talk with your health care provider about some of the most common conditions that affect women after menopause (postmenopausal women). These include heart disease, cancer, and bone loss (osteoporosis). Adopting a healthy lifestyle and getting preventive care can help to promote your health and wellness. The actions you take can also lower your chances of developing some of these common conditions. What should I know about menopause? During menopause, you may get a number of symptoms, such as:  Hot flashes. These can be moderate or severe.  Night sweats.  Decrease in sex drive.  Mood swings.  Headaches.  Tiredness.  Irritability.  Memory problems.  Insomnia. Choosing to treat or not to treat these symptoms is a decision that you make with your health care provider. Do I need hormone replacement therapy?  Hormone replacement therapy is effective in treating symptoms that are caused by menopause, such as hot flashes and night sweats.  Hormone replacement carries certain risks, especially as you become older. If you are thinking about using estrogen or estrogen with progestin, discuss the benefits and risks with your health care  provider. What is my risk for heart disease and stroke? The risk of heart disease, heart attack, and stroke increases as you age. One of the causes may be a change in the body's hormones during menopause. This can affect how your body uses dietary fats, triglycerides, and cholesterol. Heart attack and stroke are medical emergencies. There are many things that you can do to help prevent heart disease and stroke. Watch your blood pressure  High blood pressure causes heart disease and increases the risk of stroke. This is more likely to develop in people who have high blood pressure readings, are of African descent, or are overweight.  Have your blood pressure checked: ? Every 3-5 years if you are 41-69 years of age. ? Every year if you are 29 years old or older. Eat a healthy diet   Eat a diet that includes plenty of vegetables, fruits, low-fat dairy products, and lean protein.  Do not eat a lot of foods that are high in solid fats, added sugars, or sodium. Get regular exercise Get regular exercise. This is one of the most important things you can do for your health. Most adults should:  Try to exercise for at least 150 minutes each week. The exercise should increase your heart rate and make you sweat (moderate-intensity exercise).  Try to do strengthening exercises at least twice each week. Do these in addition to the moderate-intensity exercise.  Spend less time sitting. Even light physical activity can be beneficial. Other tips  Work with your health care provider to achieve or maintain a healthy weight.  Do not use any products that contain nicotine or tobacco, such as cigarettes, e-cigarettes, and chewing tobacco. If you need help quitting, ask your health care provider.  Know your numbers. Ask your health care provider to check your cholesterol and your blood sugar (glucose). Continue to have your blood tested as directed by your health care provider. Do I need screening for  cancer? Depending on your health history and family history, you may need to have cancer screening at different stages of your life. This may include screening for:  Breast cancer.  Cervical cancer.  Lung cancer.  Colorectal cancer. What is my risk for osteoporosis? After menopause, you may be at increased risk for osteoporosis. Osteoporosis is a condition in which bone destruction happens more quickly than new bone creation. To help prevent osteoporosis or the bone fractures that can happen because of osteoporosis, you may take the following actions:  If you are 25-52 years old, get at least 1,000 mg of calcium and at least 600 mg of vitamin D per day.  If you are older than age 51 but younger than age 58, get at least 1,200 mg of calcium and at least 600 mg of vitamin D per day.  If you are older than age 85, get at least 1,200 mg of calcium and at least 800 mg of vitamin D per day. Smoking and drinking excessive alcohol increase the risk of osteoporosis. Eat foods that are rich in calcium and vitamin D, and do weight-bearing exercises several times each week as directed by your health care provider. How does menopause affect my mental health? Depression may occur at any age, but it is more common as you become older. Common symptoms of depression include:  Low or sad mood.  Changes in sleep patterns.  Changes in appetite or eating patterns.  Feeling an overall lack of motivation or enjoyment of activities that you previously enjoyed.  Frequent crying spells. Talk with your health care provider if you think that you are experiencing depression. General instructions See your health care provider for regular wellness exams and vaccines. This may include:  Scheduling regular health, dental, and eye exams.  Getting and maintaining your vaccines. These include: ? Influenza vaccine. Get this vaccine each year before the flu season begins. ? Pneumonia vaccine. ? Shingles  vaccine. ? Tetanus, diphtheria, and pertussis (Tdap) booster vaccine. Your health care provider may also recommend other immunizations. Tell your health care provider if you have ever been abused or do not feel safe at home. Summary  Menopause is a normal process in which your ability to get pregnant comes to an end.  This condition causes hot flashes, night sweats, decreased interest in sex, mood swings, headaches, or lack of sleep.  Treatment for this condition may include hormone replacement therapy.  Take actions to keep yourself healthy, including exercising regularly, eating a healthy diet, watching your weight, and checking your blood pressure and blood sugar levels.  Get screened for cancer and depression. Make sure that you are up to date with all your vaccines. This information is not intended to replace advice given to you by your health care provider. Make sure you discuss any questions you have with your health care provider. Document Revised: 04/27/2018 Document Reviewed: 04/27/2018 Elsevier Patient Education  2020 [employer].

## 2020-03-07 ENCOUNTER — Telehealth: Payer: Self-pay

## 2020-03-07 DIAGNOSIS — Z8249 Family history of ischemic heart disease and other diseases of the circulatory system: Secondary | ICD-10-CM

## 2020-03-07 LAB — URINALYSIS, ROUTINE W REFLEX MICROSCOPIC
Bilirubin Urine: NEGATIVE
Hgb urine dipstick: NEGATIVE
Ketones, ur: NEGATIVE
Leukocytes,Ua: NEGATIVE
Nitrite: NEGATIVE
RBC / HPF: NONE SEEN (ref 0–?)
Specific Gravity, Urine: 1.01 (ref 1.000–1.030)
Total Protein, Urine: NEGATIVE
Urine Glucose: NEGATIVE
Urobilinogen, UA: 0.2 (ref 0.0–1.0)
WBC, UA: NONE SEEN (ref 0–?)
pH: 6 (ref 5.0–8.0)

## 2020-03-07 LAB — HEPATITIS C ANTIBODY
Hepatitis C Ab: NONREACTIVE
SIGNAL TO CUT-OFF: 0.01 (ref <1.00)

## 2020-03-07 NOTE — Telephone Encounter (Signed)
[place] Night - Cl Nonclinical Telephone Record  [facility] Client [place] Night - Cl Client Site [place] Physician Mable Paris- NP Contact Type Call Who Is Calling Patient / Member / Family / Caregiver Caller Name Bridgit Eynon Caller Phone Number [phone] Patient Name Brittney Ruiz Patient DOB 1965-09-08 Call Type Message Only Information Provided Reason for Call Request for General Office Information Initial Comment Caller was supposed to call the insurance company to see if they would cover Triple A scan for an abdominal aortic aneurism. So Can the doctor do a referral. Yes the insurance will approve. Disp. Time Disposition Final User 03/06/2020 5:20:03 PM General Information Provided Yes Tyler Deis Call Closed By: Tyler Deis Transaction Date/Time: 03/06/2020 5:17:17 PM (ET)

## 2020-03-08 ENCOUNTER — Encounter: Payer: Self-pay | Admitting: Family

## 2020-03-08 LAB — CYTOLOGY - PAP
Comment: NEGATIVE
Diagnosis: NEGATIVE
High risk HPV: NEGATIVE

## 2020-03-08 NOTE — Telephone Encounter (Signed)
Call pt US abdominal aortic aneursym screen ordered Let us know if you dont hear back within a week in regards to an appointment being scheduled.

## 2020-03-08 NOTE — Addendum Note (Signed)
Addended by: Burnard Hawthorne on: 03/08/2020 08:09 AM   Modules accepted: Orders

## 2020-03-08 NOTE — Telephone Encounter (Signed)
I called & let patient know that [name] was ordered. She stated that she was feeling some better, but had a bad episode of acid reflux. She was still a little nauseated as well. I asked pt did she feel that she needed to be seen for this & she stated no. I asked that she call us back if anything else was needed or sx worsened.

## 2020-03-11 ENCOUNTER — Telehealth: Payer: Self-pay

## 2020-03-11 ENCOUNTER — Encounter (INDEPENDENT_AMBULATORY_CARE_PROVIDER_SITE_OTHER): Payer: Self-pay | Admitting: Physician Assistant

## 2020-03-11 ENCOUNTER — Ambulatory Visit (INDEPENDENT_AMBULATORY_CARE_PROVIDER_SITE_OTHER): Payer: Medicare HMO | Admitting: Physician Assistant

## 2020-03-11 ENCOUNTER — Other Ambulatory Visit: Payer: Self-pay

## 2020-03-11 VITALS — BP 111/73 | HR 61 | Temp 97.9°F | Ht 62.0 in | Wt 183.0 lb

## 2020-03-11 DIAGNOSIS — E8881 Metabolic syndrome: Secondary | ICD-10-CM

## 2020-03-11 DIAGNOSIS — Z6833 Body mass index (BMI) 33.0-33.9, adult: Secondary | ICD-10-CM | POA: Diagnosis not present

## 2020-03-11 DIAGNOSIS — E559 Vitamin D deficiency, unspecified: Secondary | ICD-10-CM | POA: Diagnosis not present

## 2020-03-11 DIAGNOSIS — Z87891 Personal history of nicotine dependence: Secondary | ICD-10-CM

## 2020-03-11 DIAGNOSIS — Z122 Encounter for screening for malignant neoplasm of respiratory organs: Secondary | ICD-10-CM

## 2020-03-11 DIAGNOSIS — E669 Obesity, unspecified: Secondary | ICD-10-CM

## 2020-03-11 NOTE — Telephone Encounter (Signed)
Contacted patient for lung CT screening program based on referral from Mable Paris, [place].  Patient is agreeable to program and CT has been scheduled for Wed, Nov 24 at 10:45.  Patient was given address to imaging center and phone number to [facility], lung navigator.  Patient confirmed she has Medicare [employer].  She is grateful to be a part of this program as she has a strong family history of cancer.  She herself has a history of cervical cancer and melanoma, both of which she said were 18 to 20 years ago.  She is a former smoker who stopped smoking around age 54.  She started smoking at age 53 and smoked 1 pack per day mostly for the next 25 years.  She did share she stopped smoking during her two pregnancies.

## 2020-03-13 NOTE — Telephone Encounter (Signed)
Former smoker, quit 2008, 25 pack year

## 2020-03-13 NOTE — Progress Notes (Signed)
Chief Complaint:   OBESITY Brittney Ruiz is here to discuss her progress with her obesity treatment plan along with follow-up of her obesity related diagnoses. Brittney Ruiz is on the Category 3 Plan and states she is following her eating plan approximately 98% of the time. Brittney Ruiz states she is increasing her activity and stretching for exercise.   Today's visit was #: 52 Starting weight: 217 lbs Starting date: 06/01/2019 Today's weight: 183 lbs Today's date: 03/11/2020 Total lbs lost to date: 34 Total lbs lost since last in-office visit: 0  Interim History: Brittney Ruiz states that she has not been weighing her protein at night. She is finding herself hungrier more often.  Subjective:   Insulin resistance. Brittney Ruiz has a diagnosis of insulin resistance based on her elevated fasting insulin level >5. She continues to work on diet and exercise to decrease her risk of diabetes. Brittney Ruiz is on no medication. She reports polyphagia.  Lab Results  Component Value Date   INSULIN 8.5 11/14/2019   INSULIN 26.1 (H) 06/01/2019   Lab Results  Component Value Date   HGBA1C 5.4 11/14/2019   Vitamin D deficiency. Brittney Ruiz is on Vitamin D supplementation, which she is tolerating well.   Ref. Range 11/14/2019 14:42  Vitamin D, 25-Hydroxy Latest Ref Range: 30.0 - 100.0 ng/mL 54.5   Assessment/Plan:   Insulin resistance. Brittney Ruiz will continue to work on weight loss, exercise, and decreasing simple carbohydrates to help decrease the risk of diabetes. Brittney Ruiz agreed to follow-up with Brittney Ruiz as directed to closely monitor her progress. Labs will be checked at the time of her next visit.  Vitamin D deficiency. Low Vitamin D level contributes to fatigue and are associated with obesity, breast, and colon cancer. She agrees to continue to take Vitamin D as directed and will follow-up for routine testing of Vitamin D at the time of her next office visit.  Class 1 obesity with serious comorbidity and body mass index (BMI) of 33.0 to 33.9  in adult, unspecified obesity type.  Brittney Ruiz is currently in the action stage of change. As such, her goal is to continue with weight loss efforts. She has agreed to the Category 3 Plan + 100 calories of protein.   Labs and IC will be checked at the time of her next office visit.  Exercise goals: For substantial health benefits, adults should do at least 150 minutes (2 hours and 30 minutes) a week of moderate-intensity, or 75 minutes (1 hour and 15 minutes) a week of vigorous-intensity aerobic physical activity, or an equivalent combination of moderate- and vigorous-intensity aerobic activity. Aerobic activity should be performed in episodes of at least 10 minutes, and preferably, it should be spread throughout the week.  Behavioral modification strategies: meal planning and cooking strategies and keeping healthy foods in the home.  Brittney Ruiz has agreed to follow-up with our clinic in 4 weeks. She was informed of the importance of frequent follow-up visits to maximize her success with intensive lifestyle modifications for her multiple health conditions.   Objective:   Blood pressure 111/73, pulse 61, temperature 97.9 F (36.6 C), temperature source Oral, height 5\' 2"  (1.575 m), weight 183 lb (83 kg), SpO2 97 %. Body mass index is 33.47 kg/m.  General: Cooperative, alert, well developed, in no acute distress. HEENT: Conjunctivae and lids unremarkable. Cardiovascular: Regular rhythm.  Lungs: Normal work of breathing. Neurologic: No focal deficits.   Lab Results  Component Value Date   CREATININE 0.67 11/14/2019   BUN 26 (H) 11/14/2019  NA 141 11/14/2019   K 4.6 11/14/2019   CL 101 11/14/2019   CO2 24 11/14/2019   Lab Results  Component Value Date   ALT 34 (H) 11/14/2019   AST 31 11/14/2019   ALKPHOS 83 11/14/2019   BILITOT 0.4 11/14/2019   Lab Results  Component Value Date   HGBA1C 5.4 11/14/2019   HGBA1C 5.4 06/01/2019   HGBA1C 5.7 05/02/2018   HGBA1C 5.4 04/20/2016   HGBA1C  5.4 05/28/2015   Lab Results  Component Value Date   INSULIN 8.5 11/14/2019   INSULIN 26.1 (H) 06/01/2019   Lab Results  Component Value Date   TSH 1.810 06/01/2019   Lab Results  Component Value Date   CHOL 217 (H) 11/14/2019   HDL 30 (L) 11/14/2019   LDLCALC 160 (H) 11/14/2019   LDLDIRECT 141.0 05/02/2018   TRIG 148 11/14/2019   CHOLHDL 8 05/02/2018   Lab Results  Component Value Date   WBC 6.9 06/01/2019   HGB 14.5 06/01/2019   HCT 43.1 06/01/2019   MCV 86 06/01/2019   PLT 175 06/01/2019   Lab Results  Component Value Date   FERRITIN 111 08/12/2012   Obesity Behavioral Intervention:   Approximately 15 minutes were spent on the discussion below.  ASK: We discussed the diagnosis of obesity with Brittney Ruiz today and Brittney Ruiz agreed to give Brittney Ruiz permission to discuss obesity behavioral modification therapy today.  ASSESS: Brittney Ruiz has the diagnosis of obesity and her BMI today is 33.5. Brittney Ruiz is in the action stage of change.   ADVISE: Brittney Ruiz was educated on the multiple health risks of obesity as well as the benefit of weight loss to improve her health. She was advised of the need for long term treatment and the importance of lifestyle modifications to improve her current health and to decrease her risk of future health problems.  AGREE: Multiple dietary modification options and treatment options were discussed and Brittney Ruiz agreed to follow the recommendations documented in the above note.  ARRANGE: Brittney Ruiz was educated on the importance of frequent visits to treat obesity as outlined per CMS and USPSTF guidelines and agreed to schedule her next follow up appointment today.  Attestation Statements:   Reviewed by clinician on day of visit: allergies, medications, problem list, medical history, surgical history, family history, social history, and previous encounter notes.  Brittney Ruiz, am acting as transcriptionist for Abby Potash, PA-C   I have reviewed the above documentation for  accuracy and completeness, and I agree with the above. Abby Potash, PA-C

## 2020-03-13 NOTE — Addendum Note (Signed)
Addended by: Lieutenant Diego on: 03/13/2020 02:19 PM   Modules accepted: Orders

## 2020-03-14 ENCOUNTER — Other Ambulatory Visit: Payer: Self-pay

## 2020-03-14 ENCOUNTER — Ambulatory Visit
Admission: RE | Admit: 2020-03-14 | Discharge: 2020-03-14 | Disposition: A | Discharge status: Home / Self Care | Payer: Medicare HMO | Source: Ambulatory Visit | Attending: Family | Admitting: Family

## 2020-03-14 DIAGNOSIS — Z8249 Family history of ischemic heart disease and other diseases of the circulatory system: Secondary | ICD-10-CM | POA: Diagnosis not present

## 2020-03-14 DIAGNOSIS — Z87891 Personal history of nicotine dependence: Secondary | ICD-10-CM | POA: Insufficient documentation

## 2020-03-14 DIAGNOSIS — Z136 Encounter for screening for cardiovascular disorders: Secondary | ICD-10-CM | POA: Insufficient documentation

## 2020-03-14 DIAGNOSIS — I700 Atherosclerosis of aorta: Secondary | ICD-10-CM | POA: Diagnosis not present

## 2020-03-15 ENCOUNTER — Ambulatory Visit: Payer: Medicare HMO | Admitting: Family

## 2020-03-15 DIAGNOSIS — N3941 Urge incontinence: Secondary | ICD-10-CM | POA: Diagnosis not present

## 2020-03-26 DIAGNOSIS — H53451 Other localized visual field defect, right eye: Secondary | ICD-10-CM | POA: Diagnosis not present

## 2020-03-26 DIAGNOSIS — H532 Diplopia: Secondary | ICD-10-CM | POA: Diagnosis not present

## 2020-04-05 ENCOUNTER — Encounter: Payer: Self-pay | Admitting: Nurse Practitioner

## 2020-04-05 ENCOUNTER — Telehealth (INDEPENDENT_AMBULATORY_CARE_PROVIDER_SITE_OTHER): Payer: Medicare HMO | Admitting: Nurse Practitioner

## 2020-04-05 ENCOUNTER — Other Ambulatory Visit: Payer: Medicare HMO

## 2020-04-05 ENCOUNTER — Other Ambulatory Visit: Payer: Self-pay

## 2020-04-05 VITALS — Temp 98.2°F | Ht 62.0 in | Wt 183.0 lb

## 2020-04-05 DIAGNOSIS — J019 Acute sinusitis, unspecified: Secondary | ICD-10-CM

## 2020-04-05 DIAGNOSIS — J209 Acute bronchitis, unspecified: Secondary | ICD-10-CM | POA: Insufficient documentation

## 2020-04-05 DIAGNOSIS — Z20822 Contact with and (suspected) exposure to covid-19: Secondary | ICD-10-CM | POA: Diagnosis not present

## 2020-04-05 DIAGNOSIS — B9689 Other specified bacterial agents as the cause of diseases classified elsewhere: Secondary | ICD-10-CM

## 2020-04-05 DIAGNOSIS — J45909 Unspecified asthma, uncomplicated: Secondary | ICD-10-CM

## 2020-04-05 MED ORDER — AMOXICILLIN-POT CLAVULANATE 875-125 MG PO TABS: 1.0000 | ORAL_TABLET | Freq: Two times a day (BID) | ORAL | 0 refills | Status: DC

## 2020-04-05 NOTE — Progress Notes (Signed)
Virtual Visit via Video Note  This visit type was conducted due to national recommendations for restrictions regarding the COVID-19 pandemic (e.g. social distancing).  This format is felt to be most appropriate for this patient at this time.  All issues noted in this document were discussed and addressed.  No physical exam was performed (except for noted visual exam findings with Video Visits).   I connected with@ on 04/05/20 at  4:30 PM EST by a video enabled telemedicine application or telephone and verified that I am speaking with the correct person using two identifiers. Location patient: home Location provider: work or home office Persons participating in the virtual visit: patient, provider  I discussed the limitations, risks, security and privacy concerns of performing an evaluation and management service by telephone and the availability of in person appointments. I also discussed with the patient that there may be a patient responsible charge related to this service. The patient expressed understanding and agreed to proceed.  Reason for visit: Sore throat, coughing up green mucus, runny nose and fatigue onset 3 days.  Has been taking cough drops and ibuprofen with Mucinex.  Did not had a Covid test.  She did receive her Covid booster on 03/27/2020.  HPI: This 54 year old with history of hypertension, mitral valve regurgitation, GERD, depression anxiety, hyperlipidemia, obesity, reports that she received the [employer] booster on 03/27/2020.    Onset of sore throat, intermittent runny nose on 04/02/2020 . By 04/03/20, she was hacking up green chunks of mucus from nose and continued with sore throat using salt water.   She developed hoarseness yesterday, is coughing up more green mucus yesterday.She has no chest pain or shortness of breath, at night she had very slight upper airway wheezing.  She can take in a deep breath without tightness or wheezing.  Eating and drinking well and able to  swallow with throat not swollen. Positive fatigue, but no headache, body aches, nausea, vomiting, or diarrhea.  She has been taking ibuprofen children's strength every 6 hours for the last 2 days.  She has not used Tylenol. Used her albuterol last night x1, takes her Allegra daily along with Flonase daily. Using cough drops, Mucinex.     ROS: See pertinent positives and negatives per HPI.  Past Medical History:  Diagnosis Date  . Abnormal Pap smear of cervix    h/o LEEP  . Allergy   . Angio-edema   . Anxiety   . Asthma   . Bladder leak   . Cancer ([place])    melanoma 2003  . Chest pain   . Constipation   . COPD (chronic obstructive pulmonary disease) ([place])   . Depression   . Diarrhea   . Food allergy   . Heart valve problem   . Heartburn   . History of swelling of feet   . Hyperlipidemia   . Hypertension   . IBS (irritable bowel syndrome)   . Joint pain   . Low back pain   . Melanoma ([place])    right foot s/p lymph node removed right groin  . Obesity (BMI 30-39.9)   . Palpitation   . Prediabetes   . Shortness of breath   . Urticaria   . Vaginal delivery    X 2    Past Surgical History:  Procedure Laterality Date  . BLADDER SUSPENSION  07/2017  . COLONOSCOPY WITH PROPOFOL N/A 07/01/2017   Procedure: COLONOSCOPY WITH PROPOFOL;  Surgeon: Virgel Manifold, MD;  Location: [facility] ENDOSCOPY;  Service: Endoscopy;  Laterality: N/A;  . ESOPHAGOGASTRODUODENOSCOPY (EGD) WITH PROPOFOL N/A 07/01/2017   Procedure: ESOPHAGOGASTRODUODENOSCOPY (EGD) WITH PROPOFOL;  Surgeon: Virgel Manifold, MD;  Location: [facility] ENDOSCOPY;  Service: Endoscopy;  Laterality: N/A;  . LEEP  1995  . LEEP    . PARTIAL HYSTERECTOMY  08/2017   s/p partial hysterectomy - per patient HAS cervix, uterus, fallopian tubes; ovaries INTACT. NO CERVIX on exam 03/06/20  . SKIN CANCER EXCISION     melanoma  . TONSILLECTOMY      Family History  Problem Relation Age of Onset  . Hyperlipidemia Mother   .  Hypertension Mother   . Diabetes Mother   . Breast cancer Mother 67  . Stroke Mother   . Cancer Mother   . Cancer Father        lung  . Depression Father   . Diabetes Father   . Hypertension Father   . Hyperlipidemia Father   . Sleep apnea Father   . Alcoholism Father   . Hyperlipidemia Sister   . Hypertension Sister   . Migraines Sister   . Hypertension Brother   . Hyperlipidemia Brother   . Migraines Brother   . Cancer Maternal Grandmother        glioblastoma  . Depression Maternal Grandfather   . Heart disease Maternal Grandfather   . Cancer Paternal Grandfather        unknown  . AAA (abdominal aortic aneurysm) Paternal Grandfather     SOCIAL HX: Quit smoking cigarettes in 2009 20-pack-year history.   Current Outpatient Medications:  .  ACCU-CHEK SOFTCLIX LANCETS lancets, TEST BLOOD SUGAR UP TO FOUR TIMES DAILY AS DIRECTED, Disp: 400 each, Rfl: 0 .  albuterol (PROVENTIL HFA;VENTOLIN HFA) 108 (90 Base) MCG/ACT inhaler, Inhale 2 puffs into the lungs every 6 (six) hours as needed., Disp: 1 Inhaler, Rfl: 1 .  albuterol (PROVENTIL) (2.5 MG/3ML) 0.083% nebulizer solution, Take 3 mLs (2.5 mg total) by nebulization every 6 (six) hours as needed for wheezing or shortness of breath., Disp: 30 mL, Rfl: 3 .  aspirin EC 81 MG tablet, Take 81 mg by mouth daily., Disp: , Rfl:  .  B Complex Vitamins (BL VITAMIN B COMPLEX PO), +Folic acid qd., Disp: , Rfl:  .  Blood Glucose Monitoring Suppl (ACCU-CHEK AVIVA PLUS) w/Device KIT, USE UP TO FOUR TIMES DAILY AS DIRECTED, Disp: 1 kit, Rfl: 0 .  Cholecalciferol (VITAMIN D-3) 25 MCG (1000 UT) CAPS, Take 1 capsule by mouth daily., Disp: , Rfl:  .  clorazepate (TRANXENE) 7.5 MG tablet, Take 7.5 mg by mouth 3 (three) times daily., Disp: , Rfl:  .  ezetimibe (ZETIA) 10 MG tablet, Take 2.5 mg by mouth once a week. Tuesday morning, Disp: , Rfl:  .  fexofenadine (ALLEGRA) 60 MG tablet, Take 60 mg by mouth daily., Disp: , Rfl:  .  fluticasone (FLONASE)  50 MCG/ACT nasal spray, USE 2 SPRAYS INTO BOTH NOSTRILS DAILY., Disp: 48 g, Rfl: 2 .  glucose blood (ACCU-CHEK AVIVA PLUS) test strip, USE UP TO FOUR TIMES DAILY AS DIRECTED, Disp: 100 each, Rfl: 11 .  ibuprofen (ADVIL) 200 MG tablet, Take 200 mg by mouth every 6 (six) hours as needed., Disp: , Rfl:  .  Multiple Vitamin (MULTIVITAMIN) tablet, Take 1 tablet by mouth daily., Disp: , Rfl:  .  Omega-3 Fatty Acids (FISH OIL) 1000 MG CAPS, Take 1 capsule by mouth daily., Disp: , Rfl:  .  omeprazole (PRILOSEC) 20 MG capsule, TAKE 1 CAPSULE EVERY  DAY, Disp: 90 capsule, Rfl: 0 .  polyethylene glycol powder (MIRALAX) powder, Take 1 Container by mouth daily., Disp: , Rfl:  .  propranolol (INDERAL) 20 MG tablet, Take 1 tablet (20 mg total) by mouth 3 (three) times daily., Disp: 90 tablet, Rfl: 3 .  psyllium (METAMUCIL) 58.6 % powder, Take 1 packet by mouth daily., Disp: , Rfl:  .  Red Yeast Rice Extract (RED YEAST RICE PO), Take by mouth daily. , Disp: , Rfl:  .  sertraline (ZOLOFT) 100 MG tablet, TAKE 1+1/2 TABLETS BY MOUTH DAILY AS DIRECTED (Patient taking differently: Take 112.5 mg by mouth in the morning. ), Disp: 45 tablet, Rfl: 3 .  sertraline (ZOLOFT) 25 MG tablet, Take 25 mg by mouth every evening. , Disp: , Rfl:  .  Spacer/Aero Chamber Mouthpiece MISC, 1 puff by Does not apply route as needed., Disp: 1 each, Rfl: 1 .  amoxicillin-clavulanate (AUGMENTIN) 875-125 MG tablet, Take 1 tablet by mouth 2 (two) times daily., Disp: 20 tablet, Rfl: 0  EXAM:  VITALS per patient if applicable: Temp 74.1 weight 183 pounds.  She does not have a pulse oximeter.  GENERAL: alert, oriented, appears mildly ill  and in no acute distress  HEENT: atraumatic, conjunctiva clear, no obvious abnormalities on inspection of external nose and ears.  Nasal congestion noted.  NECK: normal movements of the head and neck  LUNGS: on inspection no signs of respiratory distress, breathing rate appears normal, no obvious gross SOB,  gasping or wheezing.  Positive cough noted.  CV: no obvious cyanosis  MS: moves all visible extremities without noticeable abnormality  PSYCH/NEURO: pleasant and cooperative, no obvious depression or anxiety, speech and thought processing grossly intact  ASSESSMENT AND PLAN:  Discussed the following assessment and plan:  Acute bacterial sinusitis  Acute bronchitis with asthma   Please get a Covid test- local pharmacy or call [facility] testing sites to schedule an appt [phone].   I have ordered Augmentin 875 mg one pill twice a day for 10 days for sinus infection and bronchitis.   Please eat probiotics that you have at home yogurt, cottage cheese, buttermilk since you cannot take any other type of probiotic preparation.  Please use your albuterol nebulizer as needed every 6 hours for cough and wheezing.  Continue with Allegra daily.    Continue with Flonase daily   Continue with normal saline nasal spray use that throughout the day to help with decongestion, help clear your nasal passages  May take your over the counter cough medicine that works for you.  Please get rest, drink plenty of fluids, and use tylenol or ibuprofen as needed for pain. \  Follow up if fever >101, if symptoms worsen or if symptoms are not improved in 3 days. Seek in person exam at a Acute Care if symptoms worsen.   I discussed the assessment and treatment plan with the patient. The patient was provided an opportunity to ask questions and all were answered. The patient agreed with the plan and demonstrated an understanding of the instructions.   The patient was advised to call back or seek an in-person evaluation if the symptoms worsen or if the condition fails to improve as anticipated.  Denice Paradise, NP Adult Nurse Practitioner [place] [phone]

## 2020-04-05 NOTE — Patient Instructions (Addendum)
Please get a Covid test- local pharmacy or call [facility] testing sites to schedule an appt [phone].   I have ordered Augmentin 875 mg one pill twice a day for 10 days for sinus infection and bronchitis.   Please eat probiotics that you have at home yogurt, cottage cheese, buttermilk since you cannot take any other type of probiotic preparation.  Please use your albuterol nebulizer as needed every 6 hours for cough and wheezing.  Continue with Allegra daily.    Continue with Flonase daily   Continue with normal saline nasal spray use that throughout the day to help with decongestion, help clear your nasal passages  May take your over the counter cough medicine that works for you.  Please get rest, drink plenty of fluids, and use tylenol or ibuprofen as needed for pain. \  Follow up if fever >101, if symptoms worsen or if symptoms are not improved in 3 days. Seek in person exam at a Acute Care if symptoms worsen.     Sinusitis, Adult Sinusitis is inflammation of your sinuses. Sinuses are hollow spaces in the bones around your face. Your sinuses are located:  Around your eyes.  In the middle of your forehead.  Behind your nose.  In your cheekbones. Mucus normally drains out of your sinuses. When your nasal tissues become inflamed or swollen, mucus can become trapped or blocked. This allows bacteria, viruses, and fungi to grow, which leads to infection. Most infections of the sinuses are caused by a virus. Sinusitis can develop quickly. It can last for up to 4 weeks (acute) or for more than 12 weeks (chronic). Sinusitis often develops after a cold. What are the causes? This condition is caused by anything that creates swelling in the sinuses or stops mucus from draining. This includes:  Allergies.  Asthma.  Infection from bacteria or viruses.  Deformities or blockages in your nose or sinuses.  Abnormal growths in the nose (nasal polyps).  Pollutants, such as chemicals or  irritants in the air.  Infection from fungi (rare). What increases the risk? You are more likely to develop this condition if you:  Have a weak body defense system (immune system).  Do a lot of swimming or diving.  Overuse nasal sprays.  Smoke. What are the signs or symptoms? The main symptoms of this condition are pain and a feeling of pressure around the affected sinuses. Other symptoms include:  Stuffy nose or congestion.  Thick drainage from your nose.  Swelling and warmth over the affected sinuses.  Headache.  Upper toothache.  A cough that may get worse at night.  Extra mucus that collects in the throat or the back of the nose (postnasal drip).  Decreased sense of smell and taste.  Fatigue.  A fever.  Sore throat.  Bad breath. How is this diagnosed? This condition is diagnosed based on:  Your symptoms.  Your medical history.  A physical exam.  Tests to find out if your condition is acute or chronic. This may include: ? Checking your nose for nasal polyps. ? Viewing your sinuses using a device that has a light (endoscope). ? Testing for allergies or bacteria. ? Imaging tests, such as an MRI or CT scan. In rare cases, a bone biopsy may be done to rule out more serious types of fungal sinus disease. How is this treated? Treatment for sinusitis depends on the cause and whether your condition is chronic or acute.  If caused by a virus, your symptoms should go  away on their own within 10 days. You may be given medicines to relieve symptoms. They include: ? Medicines that shrink swollen nasal passages (topical intranasal decongestants). ? Medicines that treat allergies (antihistamines). ? A spray that eases inflammation of the nostrils (topical intranasal corticosteroids). ? Rinses that help get rid of thick mucus in your nose (nasal saline washes).  If caused by bacteria, your health care provider may recommend waiting to see if your symptoms improve.  Most bacterial infections will get better without antibiotic medicine. You may be given antibiotics if you have: ? A severe infection. ? A weak immune system.  If caused by narrow nasal passages or nasal polyps, you may need to have surgery. Follow these instructions at home: Medicines  Take, use, or apply over-the-counter and prescription medicines only as told by your health care provider. These may include nasal sprays.  If you were prescribed an antibiotic medicine, take it as told by your health care provider. Do not stop taking the antibiotic even if you start to feel better. Hydrate and humidify   Drink enough fluid to keep your urine pale yellow. Staying hydrated will help to thin your mucus.  Use a cool mist humidifier to keep the humidity level in your home above 50%.  Inhale steam for 10-15 minutes, 3-4 times a day, or as told by your health care provider. You can do this in the bathroom while a hot shower is running.  Limit your exposure to cool or dry air. Rest  Rest as much as possible.  Sleep with your head raised (elevated).  Make sure you get enough sleep each night. General instructions   Apply a warm, moist washcloth to your face 3-4 times a day or as told by your health care provider. This will help with discomfort.  Wash your hands often with soap and water to reduce your exposure to germs. If soap and water are not available, use hand sanitizer.  Do not smoke. Avoid being around people who are smoking (secondhand smoke).  Keep all follow-up visits as told by your health care provider. This is important. Contact a health care provider if:  You have a fever.  Your symptoms get worse.  Your symptoms do not improve within 10 days. Get help right away if:  You have a severe headache.  You have persistent vomiting.  You have severe pain or swelling around your face or eyes.  You have vision problems.  You develop confusion.  Your neck is  stiff.  You have trouble breathing. Summary  Sinusitis is soreness and inflammation of your sinuses. Sinuses are hollow spaces in the bones around your face.  This condition is caused by nasal tissues that become inflamed or swollen. The swelling traps or blocks the flow of mucus. This allows bacteria, viruses, and fungi to grow, which leads to infection.  If you were prescribed an antibiotic medicine, take it as told by your health care provider. Do not stop taking the antibiotic even if you start to feel better.  Keep all follow-up visits as told by your health care provider. This is important. This information is not intended to replace advice given to you by your health care provider. Make sure you discuss any questions you have with your health care provider. Document Revised: 10/04/2017 Document Reviewed: 10/04/2017 Elsevier Patient Education  [place].  Acute Bronchitis, Adult  Acute bronchitis is sudden or acute swelling of the air tubes (bronchi) in the lungs. Acute bronchitis causes these  tubes to fill with mucus, which can make it hard to breathe. It can also cause coughing or wheezing. In adults, acute bronchitis usually goes away within 2 weeks. A cough caused by bronchitis may last up to 3 weeks. Smoking, allergies, and asthma can make the condition worse. What are the causes? This condition can be caused by germs and by substances that irritate the lungs, including:  Cold and flu viruses. The most common cause of this condition is the virus that causes the common cold.  Bacteria.  Substances that irritate the lungs, including: ? Smoke from cigarettes and other forms of tobacco. ? Dust and pollen. ? Fumes from chemical products, gases, or burned fuel. ? Other materials that pollute indoor or outdoor air.  Close contact with someone who has acute bronchitis. What increases the risk? The following factors may make you more likely to develop this  condition:  A weak body's defense system, also called the immune system.  A condition that affects your lungs and breathing, such as asthma. What are the signs or symptoms? Common symptoms of this condition include:  Lung and breathing problems, such as: ? Coughing. This may bring up clear, yellow, or green mucus from your lungs (sputum). ? Wheezing. ? Having too much mucus in your lungs (chest congestion). ? Having shortness of breath.  A fever.  Chills.  Aches and pains, including: ? Tightness in your chest and other body aches. ? A sore throat. How is this diagnosed? This condition is usually diagnosed based on:  Your symptoms and medical history.  A physical exam. You may also have other tests, including tests to rule out other conditions, such pneumonia. These tests include:  A test of lung function.  Test of a mucus sample to look for the presence of bacteria.  Tests to check the oxygen level in your blood.  Blood tests.  Chest X-ray. How is this treated? Most cases of acute bronchitis clear up over time without treatment. Your health care provider may recommend:  Drinking more fluids. This can thin your mucus, which may improve your breathing.  Taking a medicine for a fever or cough.  Using a device that gets medicine into your lungs (inhaler) to help improve breathing and control coughing.  Using a vaporizer or a humidifier. These are machines that add water to the air to help you breathe better. Follow these instructions at home: Activity  Get plenty of rest.  Return to your normal activities as told by your health care provider. Ask your health care provider what activities are safe for you. Lifestyle  Drink enough fluid to keep your urine pale yellow.  Do not drink alcohol.  Do not use any products that contain nicotine or tobacco, such as cigarettes, e-cigarettes, and chewing tobacco. If you need help quitting, ask your health care provider. Be  aware that: ? Your bronchitis will get worse if you smoke or breathe in other people's smoke (secondhand smoke). ? Your lungs will heal faster if you quit smoking. General instructions   Take over-the-counter and prescription medicines only as told by your health care provider.  Use an inhaler, vaporizer, or humidifier as told by your health care provider.  If you have a sore throat, gargle with a salt-water mixture 3-4 times a day or as needed. To make a salt-water mixture, completely dissolve -1 tsp (3-6 g) of salt in 1 cup (237 mL) of warm water.  Keep all follow-up visits as told by your health care  provider. This is important. How is this prevented? To lower your risk of getting this condition again:  Wash your hands often with soap and water. If soap and water are not available, use hand sanitizer.  Avoid contact with people who have cold symptoms.  Try not to touch your mouth, nose, or eyes with your hands.  Avoid places where there are fumes from chemicals. Breathing these fumes will make your condition worse.  Get the flu shot every year. Contact a health care provider if:  Your symptoms do not improve after 2 weeks of treatment.  You vomit more than once or twice.  You have symptoms of dehydration such as: ? Dark urine. ? Dry skin or eyes. ? Increased thirst. ? Headaches. ? Confusion. ? Muscle cramps. Get help right away if you:  Cough up blood.  Feel pain in your chest.  Have severe shortness of breath.  Faint or keep feeling like you are going to faint.  Have a severe headache.  Have fever or chills that get worse. These symptoms may represent a serious problem that is an emergency. Do not wait to see if the symptoms will go away. Get medical help right away. Call your local emergency services (911 in the [place] Do not drive yourself to the hospital. Summary  Acute bronchitis is sudden (acute) inflammation of the air tubes (bronchi) between the  windpipe and the lungs. In adults, acute bronchitis usually goes away within 2 weeks, although coughing may last 3 weeks or longer  Take over-the-counter and prescription medicines only as told by your health care provider.  Drink enough fluid to keep your urine pale yellow.  Contact a health care provider if your symptoms do not improve after 2 weeks of treatment.  Get help right away if you cough up blood, faint, or have chest pain or shortness of breath. This information is not intended to replace advice given to you by your health care provider. Make sure you discuss any questions you have with your health care provider. Document Revised: 01/16/2019 Document Reviewed: 11/25/2018 Elsevier Patient Education  [place].

## 2020-04-07 LAB — SARS-COV-2, NAA 2 DAY TAT

## 2020-04-07 LAB — NOVEL CORONAVIRUS, NAA: SARS-CoV-2, NAA: NOT DETECTED

## 2020-04-08 ENCOUNTER — Other Ambulatory Visit: Payer: Self-pay | Admitting: Nurse Practitioner

## 2020-04-08 ENCOUNTER — Telehealth: Payer: Self-pay | Admitting: Nurse Practitioner

## 2020-04-08 DIAGNOSIS — J209 Acute bronchitis, unspecified: Secondary | ICD-10-CM

## 2020-04-08 MED ORDER — ALBUTEROL SULFATE HFA 108 (90 BASE) MCG/ACT IN AERS: 2.0000 | INHALATION_SPRAY | Freq: Four times a day (QID) | RESPIRATORY_TRACT | 3 refills | Status: DC | PRN

## 2020-04-08 MED ORDER — BENZONATATE 200 MG PO CAPS: 200.0000 mg | ORAL_CAPSULE | Freq: Three times a day (TID) | ORAL | 0 refills | Status: DC | PRN

## 2020-04-08 NOTE — Telephone Encounter (Addendum)
Chart review.

## 2020-04-08 NOTE — Telephone Encounter (Signed)
I called Brittney Ruiz and spoke with her about her symptoms. She did not have a cough initially, but as of Thurs- she has been coughing -esp at night. She is suing her Albuterol nebulizer and wheezes a little after use. Using Mucinex, Flonase Allegra. She is allergic to Astelin nasal spray. No SOB. Pulse Ox 96%. She is taking the Augmentin and thinks the sputum is getting lighter and breaking up. She has multiple allergies and cannot take prednisone or codeine.   PLAN:L Trial of tessalon Perles. Use the Nebulizer and her routine meds. Take the Augmentin and she checked her throat- no thrush. If no improvement advised to seek in-person care at [facility] or [facility]. She voices understanding. She would like refills on her Albuterol  inhaler.

## 2020-04-08 NOTE — Telephone Encounter (Signed)
Pt said she has now developed a cough and the cough medicine over the counter is working. She is asking if Maudie Mercury would call her in something for it? She is barely getting any sleep. She said her mucus is not green it is now yellow. She said her covid test is negative.

## 2020-04-09 ENCOUNTER — Telehealth (INDEPENDENT_AMBULATORY_CARE_PROVIDER_SITE_OTHER): Payer: Medicare HMO | Admitting: Physician Assistant

## 2020-04-09 ENCOUNTER — Encounter (INDEPENDENT_AMBULATORY_CARE_PROVIDER_SITE_OTHER): Payer: Self-pay | Admitting: Physician Assistant

## 2020-04-09 NOTE — Telephone Encounter (Signed)
Please review

## 2020-04-10 ENCOUNTER — Inpatient Hospital Stay: Payer: Medicare HMO | Admitting: Oncology

## 2020-04-10 ENCOUNTER — Ambulatory Visit: Admission: RE | Admit: 2020-04-10 | Payer: Medicare HMO | Source: Ambulatory Visit

## 2020-04-20 DIAGNOSIS — G9389 Other specified disorders of brain: Secondary | ICD-10-CM | POA: Diagnosis not present

## 2020-04-20 DIAGNOSIS — H53451 Other localized visual field defect, right eye: Secondary | ICD-10-CM | POA: Diagnosis not present

## 2020-04-20 DIAGNOSIS — H532 Diplopia: Secondary | ICD-10-CM | POA: Diagnosis not present

## 2020-04-20 DIAGNOSIS — H538 Other visual disturbances: Secondary | ICD-10-CM | POA: Diagnosis not present

## 2020-04-25 DIAGNOSIS — E785 Hyperlipidemia, unspecified: Secondary | ICD-10-CM | POA: Diagnosis not present

## 2020-04-25 DIAGNOSIS — H532 Diplopia: Secondary | ICD-10-CM | POA: Diagnosis not present

## 2020-04-25 DIAGNOSIS — Z85820 Personal history of malignant melanoma of skin: Secondary | ICD-10-CM | POA: Diagnosis not present

## 2020-04-25 DIAGNOSIS — I10 Essential (primary) hypertension: Secondary | ICD-10-CM | POA: Diagnosis not present

## 2020-05-06 ENCOUNTER — Ambulatory Visit
Admission: RE | Admit: 2020-05-06 | Discharge: 2020-05-06 | Disposition: A | Discharge status: Home / Self Care | Payer: Medicare HMO | Source: Ambulatory Visit | Attending: Oncology | Admitting: Oncology

## 2020-05-06 ENCOUNTER — Inpatient Hospital Stay: Payer: Medicare HMO | Attending: Oncology | Admitting: Oncology

## 2020-05-06 ENCOUNTER — Other Ambulatory Visit: Payer: Self-pay

## 2020-05-06 ENCOUNTER — Ambulatory Visit
Admission: RE | Admit: 2020-05-06 | Discharge: 2020-05-06 | Disposition: A | Discharge status: Home / Self Care | Payer: Medicare HMO | Source: Ambulatory Visit | Attending: Family | Admitting: Family

## 2020-05-06 ENCOUNTER — Encounter: Payer: Self-pay | Admitting: Oncology

## 2020-05-06 DIAGNOSIS — R930 Abnormal findings on diagnostic imaging of skull and head, not elsewhere classified: Secondary | ICD-10-CM

## 2020-05-06 DIAGNOSIS — Z122 Encounter for screening for malignant neoplasm of respiratory organs: Secondary | ICD-10-CM

## 2020-05-06 DIAGNOSIS — Z1231 Encounter for screening mammogram for malignant neoplasm of breast: Secondary | ICD-10-CM | POA: Insufficient documentation

## 2020-05-06 DIAGNOSIS — Z87891 Personal history of nicotine dependence: Secondary | ICD-10-CM | POA: Insufficient documentation

## 2020-05-06 DIAGNOSIS — Z0000 Encounter for general adult medical examination without abnormal findings: Secondary | ICD-10-CM

## 2020-05-06 NOTE — Progress Notes (Signed)
Virtual Visit via Video Note  I connected with Brittney Ruiz on 05/06/20 at 10:45 AM EST by a video enabled telemedicine application and verified that I am speaking with the correct person using two identifiers.  Location: Patient: Home Provider: Clinic   I discussed the limitations of evaluation and management by telemedicine and the availability of in person appointments. The patient expressed understanding and agreed to proceed.  I discussed the assessment and treatment plan with the patient. The patient was provided an opportunity to ask questions and all were answered. The patient agreed with the plan and demonstrated an understanding of the instructions.   The patient was advised to call back or seek an in-person evaluation if the symptoms worsen or if the condition fails to improve as anticipated.   In accordance with CMS guidelines, patient has met eligibility criteria including age, absence of signs or symptoms of lung cancer.  Social History   Tobacco Use  . Smoking status: Former Smoker    Packs/day: 1.00    Years: 25.00    Pack years: 25.00    Types: Cigarettes    Quit date: 2008    Years since quitting: 13.9  . Smokeless tobacco: Never Used  Vaping Use  . Vaping Use: Never used  Substance Use Topics  . Alcohol use: No  . Drug use: No      A shared decision-making session was conducted prior to the performance of CT scan. This includes one or more decision aids, includes benefits and harms of screening, follow-up diagnostic testing, over-diagnosis, false positive rate, and total radiation exposure.   Counseling on the importance of adherence to annual lung cancer LDCT screening, impact of co-morbidities, and ability or willingness to undergo diagnosis and treatment is imperative for compliance of the program.   Counseling on the importance of continued smoking cessation for former smokers; the importance of smoking cessation for current smokers, and information about  tobacco cessation interventions have been given to patient including [place] and [facility] quit [place] programs.   Written order for lung cancer screening with LDCT has been given to the patient and any and all questions have been answered to the best of my abilities.    Yearly follow up will be coordinated by Burgess Estelle, Thoracic Navigator.  I provided 15 minutes of face-to-face video visit time during this encounter, and > 50% was spent counseling as documented under my assessment & plan.   Jacquelin Hawking, NP

## 2020-05-07 ENCOUNTER — Ambulatory Visit (INDEPENDENT_AMBULATORY_CARE_PROVIDER_SITE_OTHER): Payer: Medicare HMO | Admitting: Physician Assistant

## 2020-05-07 ENCOUNTER — Encounter (INDEPENDENT_AMBULATORY_CARE_PROVIDER_SITE_OTHER): Payer: Self-pay | Admitting: Physician Assistant

## 2020-05-07 VITALS — BP 109/74 | HR 64 | Temp 97.8°F | Ht 62.0 in | Wt 177.0 lb

## 2020-05-07 DIAGNOSIS — Z6832 Body mass index (BMI) 32.0-32.9, adult: Secondary | ICD-10-CM

## 2020-05-07 DIAGNOSIS — E669 Obesity, unspecified: Secondary | ICD-10-CM | POA: Diagnosis not present

## 2020-05-07 DIAGNOSIS — E7849 Other hyperlipidemia: Secondary | ICD-10-CM | POA: Diagnosis not present

## 2020-05-07 DIAGNOSIS — R0602 Shortness of breath: Secondary | ICD-10-CM

## 2020-05-07 DIAGNOSIS — E559 Vitamin D deficiency, unspecified: Secondary | ICD-10-CM | POA: Diagnosis not present

## 2020-05-07 DIAGNOSIS — Z6834 Body mass index (BMI) 34.0-34.9, adult: Secondary | ICD-10-CM | POA: Diagnosis not present

## 2020-05-07 DIAGNOSIS — Z9189 Other specified personal risk factors, not elsewhere classified: Secondary | ICD-10-CM

## 2020-05-07 DIAGNOSIS — R739 Hyperglycemia, unspecified: Secondary | ICD-10-CM

## 2020-05-08 ENCOUNTER — Encounter: Payer: Self-pay | Admitting: *Deleted

## 2020-05-08 LAB — LIPID PANEL
Chol/HDL Ratio: 7.1 ratio — ABNORMAL HIGH (ref 0.0–4.4)
Cholesterol, Total: 241 mg/dL — ABNORMAL HIGH (ref 100–199)
HDL: 34 mg/dL — ABNORMAL LOW (ref >39)
LDL Chol Calc (NIH): 180 mg/dL — ABNORMAL HIGH (ref 0–99)
Triglycerides: 144 mg/dL (ref 0–149)
VLDL Cholesterol Cal: 27 mg/dL (ref 5–40)

## 2020-05-08 LAB — HEMOGLOBIN A1C
Est. average glucose Bld gHb Est-mCnc: 108 mg/dL
Hgb A1c MFr Bld: 5.4 % (ref 4.8–5.6)

## 2020-05-08 LAB — COMPREHENSIVE METABOLIC PANEL
ALT: 36 IU/L — ABNORMAL HIGH (ref 0–32)
AST: 25 IU/L (ref 0–40)
Albumin/Globulin Ratio: 2.5 — ABNORMAL HIGH (ref 1.2–2.2)
Albumin: 5 g/dL — ABNORMAL HIGH (ref 3.8–4.9)
Alkaline Phosphatase: 92 IU/L (ref 44–121)
BUN/Creatinine Ratio: 36 — ABNORMAL HIGH (ref 9–23)
BUN: 27 mg/dL — ABNORMAL HIGH (ref 6–24)
Bilirubin Total: 0.5 mg/dL (ref 0.0–1.2)
CO2: 28 mmol/L (ref 20–29)
Calcium: 9.7 mg/dL (ref 8.7–10.2)
Chloride: 102 mmol/L (ref 96–106)
Creatinine, Ser: 0.74 mg/dL (ref 0.57–1.00)
GFR calc Af Amer: 107 mL/min/{1.73_m2} (ref >59)
GFR calc non Af Amer: 93 mL/min/{1.73_m2} (ref >59)
Globulin, Total: 2 g/dL (ref 1.5–4.5)
Glucose: 101 mg/dL — ABNORMAL HIGH (ref 65–99)
Potassium: 4.7 mmol/L (ref 3.5–5.2)
Sodium: 141 mmol/L (ref 134–144)
Total Protein: 7 g/dL (ref 6.0–8.5)

## 2020-05-08 LAB — INSULIN, RANDOM: INSULIN: 8.2 u[IU]/mL (ref 2.6–24.9)

## 2020-05-08 LAB — VITAMIN D 25 HYDROXY (VIT D DEFICIENCY, FRACTURES): Vit D, 25-Hydroxy: 48.8 ng/mL (ref 30.0–100.0)

## 2020-05-08 NOTE — Progress Notes (Signed)
Chief Complaint:   OBESITY Brittney Ruiz is here to discuss her progress with her obesity treatment plan along with follow-up of her obesity related diagnoses. Brittney Ruiz is on the Category 3 Plan and states she is following her eating plan approximately 95% of the time. Brittney Ruiz states she is not exercising regularly at this time.  Today's visit was #: 14 Starting weight: 217 lbs Starting date: 06/01/2019 Today's weight: 177 lbs Today's date: 05/07/2020 Total lbs lost to date: 40 lbs Total lbs lost since last in-office visit: 6 lbs  Interim History: Brittney Ruiz states she has been under a lot of stress.  She is not weighing her protein at night.  She is adding protein bars as snacks.    IC revealed decrease in RMR to 1827.  Subjective:   1. Hyperglycemia No medications.  Last A1c 5.4.  No polyphagia.  She is due for labs.  2. Vitamin D deficiency Brittney Ruiz's Vitamin D level was 54.5 on 11/14/2019. She is currently taking OTC vitamin D 1,000 IU each day. She denies nausea, vomiting or muscle weakness.  She is due for labs.  3. Hyperlipidemia  She is on fish oil and Zetia 2.5 mg weekly.  No chest pain.  She is due for labs.  Lab Results  Component Value Date   ALT 36 (H) 05/07/2020   AST 25 05/07/2020   ALKPHOS 92 05/07/2020   BILITOT 0.5 05/07/2020   Lab Results  Component Value Date   CHOL 241 (H) 05/07/2020   HDL 34 (L) 05/07/2020   LDLCALC 180 (H) 05/07/2020   LDLDIRECT 141.0 05/02/2018   TRIG 144 05/07/2020   CHOLHDL 7.1 (H) 05/07/2020   4. SOB (shortness of breath) With exertion.  No dizziness or lightheadedness.    5. At risk for diabetes mellitus Brittney Ruiz is at higher than average risk for developing diabetes due to obesity.   Assessment/Plan:   1. Hyperglycemia Fasting labs will be obtained and results with be discussed with Brittney Ruiz in 2 weeks at her follow up visit. In the meanwhile Brittney Ruiz was started on a lower simple carbohydrate diet and will work on weight loss efforts.  Check labs  today.  Continue with plan.  - Hemoglobin A1c - Insulin, random  2. Vitamin D deficiency Low Vitamin D level contributes to fatigue and are associated with obesity, breast, and colon cancer. She agrees to continue to take OTC Vitamin D @1 ,000 IU daily.  Check vitamin D level today.  - VITAMIN D 25 Hydroxy (Vit-D Deficiency, Fractures)  3. Hyperlipidemia  Continue with medications.  Check labs today.  - Lipid panel  4. SOB (shortness of breath) IC today.  5. At risk for diabetes mellitus Brittney Ruiz was given approximately 15 minutes of diabetes education and counseling today. We discussed intensive lifestyle modifications today with an emphasis on weight loss as well as increasing exercise and decreasing simple carbohydrates in her diet. We also reviewed medication options with an emphasis on risk versus benefit of those discussed.   Repetitive spaced learning was employed today to elicit superior memory formation and behavioral change.  6. Class 1 obesity with serious comorbidity and body mass index (BMI) of 32.0 to 32.9 in adult, unspecified obesity type  Brittney Ruiz is currently in the action stage of change. As such, her goal is to continue with weight loss efforts. She has agreed to the Category 3 Plan.   Exercise goals: All adults should avoid inactivity. Some physical activity is better than none, and adults who participate  in any amount of physical activity gain some health benefits.  Behavioral modification strategies: meal planning and cooking strategies.  Brittney Ruiz has agreed to follow-up with our clinic in 4 weeks. She was informed of the importance of frequent follow-up visits to maximize her success with intensive lifestyle modifications for her multiple health conditions.   Brittney Ruiz was informed we would discuss her lab results at her next visit unless there is a critical issue that needs to be addressed sooner. Brittney Ruiz agreed to keep her next visit at the agreed upon time to discuss these  results.  Objective:   Blood pressure 109/74, pulse 64, temperature 97.8 F (36.6 C), height 5\' 2"  (1.575 m), weight 177 lb (80.3 kg), SpO2 96 %. Body mass index is 32.37 kg/m.  General: Cooperative, alert, well developed, in no acute distress. HEENT: Conjunctivae and lids unremarkable. Cardiovascular: Regular rhythm.  Lungs: Normal work of breathing. Neurologic: No focal deficits.   Lab Results  Component Value Date   CREATININE 0.74 05/07/2020   BUN 27 (H) 05/07/2020   NA 141 05/07/2020   K 4.7 05/07/2020   CL 102 05/07/2020   CO2 28 05/07/2020   Lab Results  Component Value Date   ALT 36 (H) 05/07/2020   AST 25 05/07/2020   ALKPHOS 92 05/07/2020   BILITOT 0.5 05/07/2020   Lab Results  Component Value Date   HGBA1C 5.4 05/07/2020   HGBA1C 5.4 11/14/2019   HGBA1C 5.4 06/01/2019   HGBA1C 5.7 05/02/2018   HGBA1C 5.4 04/20/2016   Lab Results  Component Value Date   INSULIN 8.2 05/07/2020   INSULIN 8.5 11/14/2019   INSULIN 26.1 (H) 06/01/2019   Lab Results  Component Value Date   TSH 1.810 06/01/2019   Lab Results  Component Value Date   CHOL 241 (H) 05/07/2020   HDL 34 (L) 05/07/2020   LDLCALC 180 (H) 05/07/2020   LDLDIRECT 141.0 05/02/2018   TRIG 144 05/07/2020   CHOLHDL 7.1 (H) 05/07/2020   Lab Results  Component Value Date   WBC 6.9 06/01/2019   HGB 14.5 06/01/2019   HCT 43.1 06/01/2019   MCV 86 06/01/2019   PLT 175 06/01/2019   Lab Results  Component Value Date   FERRITIN 111 08/12/2012   Attestation Statements:   Reviewed by clinician on day of visit: allergies, medications, problem list, medical history, surgical history, family history, social history, and previous encounter notes.  I, Water quality scientist, CMA, am acting as transcriptionist for Abby Potash, PA-C  I have reviewed the above documentation for accuracy and completeness, and I agree with the above. Abby Potash, PA-C

## 2020-05-13 ENCOUNTER — Telehealth: Payer: Self-pay | Admitting: *Deleted

## 2020-05-13 NOTE — Telephone Encounter (Signed)
I have placed a call to Hetty Ely regarding this message and why a referral was sent on this patient who was seen for Lung screening. I had to leave a message for her to return my call

## 2020-05-13 NOTE — Telephone Encounter (Signed)
[facility] called asking if there is a reason patient was not referred back to [facility] as she has seen them in the past

## 2020-05-14 NOTE — Telephone Encounter (Signed)
I must have made a mistake. Whom ever she has seen in the past is who she should see.   Durenda Hurt, NP 05/14/2020 3:05 PM

## 2020-05-14 NOTE — Telephone Encounter (Signed)
Call returned to [facility] and she said that she will route the referral to [facility] for [name]

## 2020-06-03 ENCOUNTER — Telehealth (INDEPENDENT_AMBULATORY_CARE_PROVIDER_SITE_OTHER): Payer: Medicare HMO | Admitting: Physician Assistant

## 2020-06-03 ENCOUNTER — Encounter (INDEPENDENT_AMBULATORY_CARE_PROVIDER_SITE_OTHER): Payer: Self-pay | Admitting: Physician Assistant

## 2020-06-03 DIAGNOSIS — E669 Obesity, unspecified: Secondary | ICD-10-CM | POA: Diagnosis not present

## 2020-06-03 DIAGNOSIS — E7849 Other hyperlipidemia: Secondary | ICD-10-CM | POA: Diagnosis not present

## 2020-06-03 DIAGNOSIS — Z6832 Body mass index (BMI) 32.0-32.9, adult: Secondary | ICD-10-CM

## 2020-06-03 DIAGNOSIS — E66811 Obesity, class 1: Secondary | ICD-10-CM

## 2020-06-03 DIAGNOSIS — E559 Vitamin D deficiency, unspecified: Secondary | ICD-10-CM

## 2020-06-05 NOTE — Progress Notes (Signed)
TeleHealth Visit:  Due to the COVID-19 pandemic, this visit was completed with telemedicine (audio/video) technology to reduce patient and provider exposure as well as to preserve personal protective equipment.   Brittney Ruiz has verbally consented to this TeleHealth visit. The patient is located at home, the provider is located at the [facility] office. The participants in this visit include the listed provider and patient. The visit was conducted today via MyChart video.   Chief Complaint: OBESITY Brittney Ruiz is here to discuss her progress with her obesity treatment plan along with follow-up of her obesity related diagnoses. Brittney Ruiz is on the Category 3 Plan and states she is following her eating plan approximately 0% of the time. Brittney Ruiz states she is doing 0 minutes 0 times per week.  Today's visit was #: 15 Starting weight: 217 lbs Starting date: 06/01/2019  Interim History: Brittney Ruiz states that she has been stressed with multiple family members diagnosed with COVID recenlty. She reports that she has been taking care of everyone else and not eating enough.  Subjective:   1. Hyperlipidemia  I reviewed labs today with the patient. Brittney Ruiz is on Zetia and Omega 3 and her lipide panel is not at goal. She can not tolerate statins. She is followed by Dr. Debara Pickett, Lipid Specialist.  2. Vitamin D deficiency Brittney Ruiz's last Vit D level was 48.8. I reviewed labs today with the patient. She is on 1,000 units daily, and she denies nausea, vomiting, or muscle weakness.  Assessment/Plan:   1. Hyperlipidemia  Cardiovascular risk and specific lipid/LDL goals reviewed. We discussed several lifestyle modifications today. Brittney Ruiz will continue to work on diet, exercise and weight loss efforts. She will follow up with Dr. Debara Pickett and trial of red yeast rice per his recommendation. Orders and follow up as documented in patient record.   Counseling Intensive lifestyle modifications are the first line treatment for this  issue. . Dietary changes: Increase soluble fiber. Decrease simple carbohydrates. . Exercise changes: Moderate to vigorous-intensity aerobic activity 150 minutes per week if tolerated. . Lipid-lowering medications: see documented in medical record.  2. Vitamin D deficiency Low Vitamin D level contributes to fatigue and are associated with obesity, breast, and colon cancer. Brittney Ruiz agreed to increase OTC Vitamin D to 2,000 IU daily and will follow-up for routine testing of Vitamin D, at least 2-3 times per year to avoid over-replacement.  3. Class 1 obesity with serious comorbidity and body mass index (BMI) of 32.0 to 32.9 in adult, unspecified obesity type Brittney Ruiz is currently in the action stage of change. As such, her goal is to continue with weight loss efforts. She has agreed to the Category 3 Plan.   Exercise goals: No exercise has been prescribed at this time.  Behavioral modification strategies: increasing lean protein intake and no skipping meals.  Brittney Ruiz has agreed to follow-up with our clinic in 4 weeks. She was informed of the importance of frequent follow-up visits to maximize her success with intensive lifestyle modifications for her multiple health conditions.  Objective:   VITALS: Per patient if applicable, see vitals. GENERAL: Alert and in no acute distress. CARDIOPULMONARY: No increased WOB. Speaking in clear sentences.  PSYCH: Pleasant and cooperative. Speech normal rate and rhythm. Affect is appropriate. Insight and judgement are appropriate. Attention is focused, linear, and appropriate.  NEURO: Oriented as arrived to appointment on time with no prompting.   Lab Results  Component Value Date   CREATININE 0.74 05/07/2020   BUN 27 (H) 05/07/2020  NA 141 05/07/2020   K 4.7 05/07/2020   CL 102 05/07/2020   CO2 28 05/07/2020   Lab Results  Component Value Date   ALT 36 (H) 05/07/2020   AST 25 05/07/2020   ALKPHOS 92 05/07/2020   BILITOT 0.5 05/07/2020   Lab Results   Component Value Date   HGBA1C 5.4 05/07/2020   HGBA1C 5.4 11/14/2019   HGBA1C 5.4 06/01/2019   HGBA1C 5.7 05/02/2018   HGBA1C 5.4 04/20/2016   Lab Results  Component Value Date   INSULIN 8.2 05/07/2020   INSULIN 8.5 11/14/2019   INSULIN 26.1 (H) 06/01/2019   Lab Results  Component Value Date   TSH 1.810 06/01/2019   Lab Results  Component Value Date   CHOL 241 (H) 05/07/2020   HDL 34 (L) 05/07/2020   LDLCALC 180 (H) 05/07/2020   LDLDIRECT 141.0 05/02/2018   TRIG 144 05/07/2020   CHOLHDL 7.1 (H) 05/07/2020   Lab Results  Component Value Date   WBC 6.9 06/01/2019   HGB 14.5 06/01/2019   HCT 43.1 06/01/2019   MCV 86 06/01/2019   PLT 175 06/01/2019   Lab Results  Component Value Date   FERRITIN 111 08/12/2012    Attestation Statements:   Reviewed by clinician on day of visit: allergies, medications, problem list, medical history, surgical history, family history, social history, and previous encounter notes.   Wilhemena Durie, am acting as transcriptionist for [employer], PA-C.  I have reviewed the above documentation for accuracy and completeness, and I agree with the above. Abby Potash, PA-C

## 2020-06-14 ENCOUNTER — Telehealth (INDEPENDENT_AMBULATORY_CARE_PROVIDER_SITE_OTHER): Payer: Medicare HMO | Admitting: Family

## 2020-06-14 ENCOUNTER — Encounter: Payer: Self-pay | Admitting: Family

## 2020-06-14 VITALS — Ht 62.0 in | Wt 179.0 lb

## 2020-06-14 DIAGNOSIS — J411 Mucopurulent chronic bronchitis: Secondary | ICD-10-CM

## 2020-06-14 DIAGNOSIS — J40 Bronchitis, not specified as acute or chronic: Secondary | ICD-10-CM | POA: Diagnosis not present

## 2020-06-14 MED ORDER — ALBUTEROL SULFATE (2.5 MG/3ML) 0.083% IN NEBU: 2.5000 mg | INHALATION_SOLUTION | Freq: Four times a day (QID) | RESPIRATORY_TRACT | 3 refills | Status: DC | PRN

## 2020-06-14 MED ORDER — AMOXICILLIN-POT CLAVULANATE 875-125 MG PO TABS: 1.0000 | ORAL_TABLET | Freq: Two times a day (BID) | ORAL | 0 refills | Status: AC

## 2020-06-14 MED ORDER — COVID-19 HOME COLLECTION TEST VI KIT: 1.0000 | PACK | Freq: Once | 0 refills | Status: DC | PRN

## 2020-06-14 NOTE — Assessment & Plan Note (Addendum)
Afebrile. No acute respiratory distress. Duration of 3 days. Strongly advised to hold off on starting antibiotic as likely viral in nature, in particular with patients antibiotic allergies. Her preference is to start antibiotic. Due to allergies, augmentin best option. Advised to use nebulizer and continue  tussin, zinc, vitamin d, vitamin C and if no improvement in the next 1-2 days, she may start augmentin which I have already sent. Ordered home covid test as well. Advised any worsening sob she  Needs to seek in person evaluation in ED.

## 2020-06-14 NOTE — Progress Notes (Signed)
Virtual Visit via Video Note  I connected with@  on 06/14/20 at  2:30 PM EST by a video enabled telemedicine application and verified that I am speaking with the correct person using two identifiers.  Location patient: home Location provider:work  Persons participating in the virtual visit: patient, provider  I discussed the limitations of evaluation and management by telemedicine and the availability of in person appointments. The patient expressed understanding and agreed to proceed.   HPI: Acute visit Complains of yellow green, cough x 3 days, unchanged. Endorses bodyaches, HA, sore throat, 'some short of breath but not bad', and chills.  No fever, Cp. Has been on tylenol, ibuprofen, tussin, zinc, vitamin d, vitamin C, with some relief.  Has been using albuterol inhaler with some relief. Nebulizer solution has expired.   Covid vaccinated with booster.  Last augmentin dose 03/2020 Cannot take azithromycin   ROS: See pertinent positives and negatives per HPI.    EXAM:  VITALS per patient if applicable: Ht <[id number]>_6<\/[id number]>_7  (1.575 m)   Wt 179 lb (81.2 kg)   LMP  (LMP Unknown)   BMI 32.74 kg/m  BP Readings from Last 3 Encounters:  05/07/20 109/74  03/11/20 111/73  03/06/20 102/68   Wt Readings from Last 3 Encounters:  06/14/20 179 lb (81.2 kg)  05/07/20 177 lb (80.3 kg)  05/06/20 179 lb (81.2 kg)    GENERAL: alert, oriented, appears well and in no acute distress  HEENT: atraumatic, conjunttiva clear, no obvious abnormalities on inspection of external nose and ears  NECK: normal movements of the head and neck  LUNGS: on inspection no signs of respiratory distress, breathing rate appears normal, no obvious gross SOB, gasping or wheezing  CV: no obvious cyanosis  MS: moves all visible extremities without noticeable abnormality  PSYCH/NEURO: pleasant and cooperative, no obvious depression or anxiety, speech and thought processing grossly intact  ASSESSMENT AND  PLAN:  Discussed the following assessment and plan:  Problem List Items Addressed This Visit      Respiratory   Bronchitis - Primary    Afebrile. No acute respiratory distress. Duration of 3 days. Strongly advised to hold off on starting antibiotic as likely viral in nature, in particular with patients antibiotic allergies. Her preference is to start antibiotic. Due to allergies, augmentin best option. Advised to use nebulizer and continue  tussin, zinc, vitamin d, vitamin C and if no improvement in the next 1-2 days, she may start augmentin which I have already sent. Ordered home covid test as well. Advised any worsening sob she  Needs to seek in person evaluation in ED.       Relevant Medications   COVID-19 Home Collection Test KIT   amoxicillin-clavulanate (AUGMENTIN) 875-125 MG tablet   albuterol (PROVENTIL) (2.5 MG/3ML) 0.083% nebulizer solution    Other Visit Diagnoses    Mucopurulent chronic bronchitis ([place])          -we discussed possible serious and likely etiologies, options for evaluation and workup, limitations of telemedicine visit vs in person visit, treatment, treatment risks and precautions. Pt prefers to treat via telemedicine empirically rather then risking or undertaking an in person visit at this moment.  .   I discussed the assessment and treatment plan with the patient. The patient was provided an opportunity to ask questions and all were answered. The patient agreed with the plan and demonstrated an understanding of the instructions.   The patient was advised to call back or seek an in-person evaluation if the symptoms  worsen or if the condition fails to improve as anticipated.   Mable Paris, FNP

## 2020-06-15 DIAGNOSIS — Z20822 Contact with and (suspected) exposure to covid-19: Secondary | ICD-10-CM | POA: Diagnosis not present

## 2020-06-21 ENCOUNTER — Encounter: Payer: Self-pay | Admitting: Family

## 2020-06-21 ENCOUNTER — Telehealth: Payer: Self-pay | Admitting: Family

## 2020-06-21 DIAGNOSIS — F39 Unspecified mood [affective] disorder: Secondary | ICD-10-CM | POA: Diagnosis not present

## 2020-06-21 DIAGNOSIS — F4001 Agoraphobia with panic disorder: Secondary | ICD-10-CM | POA: Diagnosis not present

## 2020-06-21 DIAGNOSIS — F410 Panic disorder [episodic paroxysmal anxiety] without agoraphobia: Secondary | ICD-10-CM | POA: Diagnosis not present

## 2020-06-21 NOTE — Telephone Encounter (Signed)
Patient was calle and she stated she tested positive for covid but its been 8 days and she is coughing up lot of mucous and she does not want this to turn into pneumonia, I informed the patient that it could not hurt to take the antibiotics at this point, no fever and she is doing nebulizer treatment because she started wheezing.

## 2020-06-21 NOTE — Telephone Encounter (Signed)
Patient called stated that it has been 8 days  Started having symptoms and she tested positive for covid do she wanted to know if [name] wants her to start the antibiotic she prescribed for her

## 2020-07-02 ENCOUNTER — Encounter: Payer: Self-pay | Admitting: Family

## 2020-07-04 ENCOUNTER — Other Ambulatory Visit: Payer: Self-pay

## 2020-07-04 ENCOUNTER — Ambulatory Visit (INDEPENDENT_AMBULATORY_CARE_PROVIDER_SITE_OTHER): Payer: Medicare HMO | Admitting: Physician Assistant

## 2020-07-04 ENCOUNTER — Encounter (INDEPENDENT_AMBULATORY_CARE_PROVIDER_SITE_OTHER): Payer: Self-pay | Admitting: Physician Assistant

## 2020-07-04 VITALS — BP 122/80 | HR 69 | Temp 97.9°F | Ht 62.0 in | Wt 180.0 lb

## 2020-07-04 DIAGNOSIS — Z6832 Body mass index (BMI) 32.0-32.9, adult: Secondary | ICD-10-CM | POA: Diagnosis not present

## 2020-07-04 DIAGNOSIS — E8881 Metabolic syndrome: Secondary | ICD-10-CM

## 2020-07-04 DIAGNOSIS — I10 Essential (primary) hypertension: Secondary | ICD-10-CM | POA: Diagnosis not present

## 2020-07-04 DIAGNOSIS — E669 Obesity, unspecified: Secondary | ICD-10-CM

## 2020-07-08 ENCOUNTER — Ambulatory Visit: Payer: Medicare HMO | Admitting: Family

## 2020-07-08 NOTE — Progress Notes (Signed)
Chief Complaint:   OBESITY Parthenia is here to discuss her progress with her obesity treatment plan along with follow-up of her obesity related diagnoses. Chamia is on the Category 3 Plan and states she is following her eating plan approximately 30% of the time. Patrizia states she is doing 0 minutes 0 times per week.  Today's visit was #: 58 Starting weight: 217 lbs Starting date: 06/01/2019 Today's weight: 180 lbs Today's date: 07/04/2020 Total lbs lost to date: 37 Total lbs lost since last in-office visit: 0  Interim History: Fidelia reports that she did not completely follow the meal plan due to having COVID. She lost her taste and smell, and they have not yet fully returned. She has been eating a lot of soup. She is back to eating breakfast on the plan.  Subjective:   1. Insulin resistance Tressa is not on medications, and she denies polyphagia.  2. Essential hypertension Skarlet's blood pressure is well controlled. She denies chest pain, and she is not on medications currently.  Assessment/Plan:   1. Insulin resistance Ria will continue her meal plan, and will continue to work on weight loss, exercise, and decreasing simple carbohydrates to help decrease the risk of diabetes. Nalee agreed to follow-up with [name] as directed to closely monitor her progress.  2. Essential hypertension Levette will continue working on healthy weight loss and exercise to improve blood pressure control. We will monitor her blood pressure at every visit as she continues her lifestyle modifications.  3. Class 1 obesity with serious comorbidity and body mass index (BMI) of 32.0 to 32.9 in adult, unspecified obesity type Dorita is currently in the action stage of change. As such, her goal is to continue with weight loss efforts. She has agreed to the Category 3 Plan.   Exercise goals: No exercise has been prescribed at this time.  Behavioral modification strategies: meal planning and cooking strategies and keeping  healthy foods in the home.  Shawnia has agreed to follow-up with our clinic in 4 weeks. She was informed of the importance of frequent follow-up visits to maximize her success with intensive lifestyle modifications for her multiple health conditions.   Objective:   Blood pressure 122/80, pulse 69, temperature 97.9 F (36.6 C), height 5\' 2"  (1.575 m), SpO2 96 %. Body mass index is 32.74 kg/m.  General: Cooperative, alert, well developed, in no acute distress. HEENT: Conjunctivae and lids unremarkable. Cardiovascular: Regular rhythm.  Lungs: Normal work of breathing. Neurologic: No focal deficits.   Lab Results  Component Value Date   CREATININE 0.74 05/07/2020   BUN 27 (H) 05/07/2020   NA 141 05/07/2020   K 4.7 05/07/2020   CL 102 05/07/2020   CO2 28 05/07/2020   Lab Results  Component Value Date   ALT 36 (H) 05/07/2020   AST 25 05/07/2020   ALKPHOS 92 05/07/2020   BILITOT 0.5 05/07/2020   Lab Results  Component Value Date   HGBA1C 5.4 05/07/2020   HGBA1C 5.4 11/14/2019   HGBA1C 5.4 06/01/2019   HGBA1C 5.7 05/02/2018   HGBA1C 5.4 04/20/2016   Lab Results  Component Value Date   INSULIN 8.2 05/07/2020   INSULIN 8.5 11/14/2019   INSULIN 26.1 (H) 06/01/2019   Lab Results  Component Value Date   TSH 1.810 06/01/2019   Lab Results  Component Value Date   CHOL 241 (H) 05/07/2020   HDL 34 (L) 05/07/2020   LDLCALC 180 (H) 05/07/2020   LDLDIRECT 141.0 05/02/2018  TRIG 144 05/07/2020   CHOLHDL 7.1 (H) 05/07/2020   Lab Results  Component Value Date   WBC 6.9 06/01/2019   HGB 14.5 06/01/2019   HCT 43.1 06/01/2019   MCV 86 06/01/2019   PLT 175 06/01/2019   Lab Results  Component Value Date   FERRITIN 111 08/12/2012    Obesity Behavioral Intervention:   Approximately 15 minutes were spent on the discussion below.  ASK: We discussed the diagnosis of obesity with Janiyah today and Keyshla agreed to give [name] permission to discuss obesity behavioral modification  therapy today.  ASSESS: Jona has the diagnosis of obesity and her BMI today is 32.91. Signora is in the action stage of change.   ADVISE: Sherrin was educated on the multiple health risks of obesity as well as the benefit of weight loss to improve her health. She was advised of the need for long term treatment and the importance of lifestyle modifications to improve her current health and to decrease her risk of future health problems.  AGREE: Multiple dietary modification options and treatment options were discussed and Emmarie agreed to follow the recommendations documented in the above note.  ARRANGE: Stephannie was educated on the importance of frequent visits to treat obesity as outlined per CMS and USPSTF guidelines and agreed to schedule her next follow up appointment today.  Attestation Statements:   Reviewed by clinician on day of visit: allergies, medications, problem list, medical history, surgical history, family history, social history, and previous encounter notes.   Wilhemena Durie, am acting as transcriptionist for [employer], PA-C.  I have reviewed the above documentation for accuracy and completeness, and I agree with the above. Abby Potash, PA-C

## 2020-07-09 ENCOUNTER — Ambulatory Visit (INDEPENDENT_AMBULATORY_CARE_PROVIDER_SITE_OTHER): Payer: Medicare HMO

## 2020-07-09 VITALS — Ht 62.0 in | Wt 180.0 lb

## 2020-07-09 DIAGNOSIS — Z0000 Encounter for general adult medical examination without abnormal findings: Secondary | ICD-10-CM | POA: Diagnosis not present

## 2020-07-09 NOTE — Progress Notes (Addendum)
Subjective:   Brittney Ruiz is a 55 y.o. female who presents for Medicare Annual (Subsequent) preventive examination.  Review of Systems    No ROS.  Medicare Wellness Virtual Visit.    Cardiac Risk Factors include: hypertension     Objective:    Today's Vitals   07/09/20 1104  Weight: 180 lb (81.6 kg)  Height: 5' 2"  (1.575 m)   Body mass index is 32.92 kg/m.  Advanced Directives 07/09/2020 07/07/2019 05/01/2019 06/28/2018 06/13/2018 07/01/2017 06/25/2017  Does Patient Have a Medical Advance Directive? No No No No No No No  Does patient want to make changes to medical advance directive? No - Patient declined - - - - - -  Would patient like information on creating a medical advance directive? - No - Patient declined - No - Patient declined - No - Patient declined No - Patient declined    Current Medications (verified) Outpatient Encounter Medications as of 07/09/2020  Medication Sig  . ACCU-CHEK SOFTCLIX LANCETS lancets TEST BLOOD SUGAR UP TO FOUR TIMES DAILY AS DIRECTED  . albuterol (PROVENTIL) (2.5 MG/3ML) 0.083% nebulizer solution Take 3 mLs (2.5 mg total) by nebulization every 6 (six) hours as needed for wheezing or shortness of breath.  Marland Kitchen albuterol (VENTOLIN HFA) 108 (90 Base) MCG/ACT inhaler Inhale 2 puffs into the lungs every 6 (six) hours as needed.  Marland Kitchen aspirin EC 81 MG tablet Take 81 mg by mouth daily.  . B Complex Vitamins (BL VITAMIN B COMPLEX PO) +Folic acid qd.  . benzonatate (TESSALON) 200 MG capsule Take 1 capsule (200 mg total) by mouth 3 (three) times daily as needed for cough.  . Blood Glucose Monitoring Suppl (ACCU-CHEK AVIVA PLUS) w/Device KIT USE UP TO FOUR TIMES DAILY AS DIRECTED  . Cholecalciferol (VITAMIN D-3) 25 MCG (1000 UT) CAPS Take 1 capsule by mouth daily.  . clorazepate (TRANXENE) 7.5 MG tablet Take 7.5 mg by mouth 3 (three) times daily.  Marland Kitchen COVID-19 Home Collection Test KIT 1 Product by In Vitro route once as needed for up to 1 dose.  . ezetimibe (ZETIA)  10 MG tablet Take 2.5 mg by mouth once a week. Tuesday morning  . fexofenadine (ALLEGRA) 60 MG tablet Take 60 mg by mouth daily.  . fluticasone (FLONASE) 50 MCG/ACT nasal spray USE 2 SPRAYS INTO BOTH NOSTRILS DAILY.  Marland Kitchen glucose blood (ACCU-CHEK AVIVA PLUS) test strip USE UP TO FOUR TIMES DAILY AS DIRECTED  . ibuprofen (ADVIL) 200 MG tablet Take 200 mg by mouth every 6 (six) hours as needed.  . Multiple Vitamin (MULTIVITAMIN) tablet Take 1 tablet by mouth daily.  . Omega-3 Fatty Acids (FISH OIL) 1000 MG CAPS Take 1 capsule by mouth daily.  Marland Kitchen omeprazole (PRILOSEC) 20 MG capsule TAKE 1 CAPSULE EVERY DAY  . polyethylene glycol powder (GLYCOLAX/MIRALAX) 17 GM/SCOOP powder Take 1 Container by mouth daily.  . propranolol (INDERAL) 20 MG tablet Take 1 tablet (20 mg total) by mouth 3 (three) times daily.  . psyllium (METAMUCIL) 58.6 % powder Take 1 packet by mouth daily.  . Red Yeast Rice Extract (RED YEAST RICE PO) Take by mouth daily.   . sertraline (ZOLOFT) 100 MG tablet TAKE 1+1/2 TABLETS BY MOUTH DAILY AS DIRECTED (Patient taking differently: Take 112.5 mg by mouth in the morning.)  . sertraline (ZOLOFT) 25 MG tablet Take 25 mg by mouth every evening.   Marland Kitchen Spacer/Aero Chamber Mouthpiece MISC 1 puff by Does not apply route as needed.   No facility-administered encounter medications  on file as of 07/09/2020.    Allergies (verified) Elemental sulfur, Pork allergy, Pork-derived products, Aciphex [rabeprazole sodium], Antivert [meclizine hcl], Buspar [buspirone hcl], Cat hair extract, Chlorpheniramine-pseudoeph, Chlorpheniramine-pseudoeph, Chocolate flavor, Codeine, Crestor [rosuvastatin calcium], Cymbalta [duloxetine hcl], Duloxetine, Effexor xr [venlafaxine hydrochloride], Epinephrine, Erythromycin, Escitalopram oxalate, Lamictal [lamotrigine], Levofloxacin, Lipitor [atorvastatin], Meclizine, Mometasone furoate, Mometasone furoate, Monosodium glutamate, Neurontin [gabapentin], Other, Prednisone, Shellfish  allergy, Sudafed [pseudoephedrine hcl], Sulfa antibiotics, Sulfasalazine, Valium, Wellbutrin [bupropion hcl], Wixela inhub [fluticasone-salmeterol], Xanax xr [alprazolam], Zetia [ezetimibe], Alfalfa, Brompheniramine-phenylephrine, Buspirone, and Tape   History: Past Medical History:  Diagnosis Date  . Abnormal Pap smear of cervix    h/o LEEP  . Allergy   . Angio-edema   . Anxiety   . Asthma   . Bladder leak   . Cancer ([place])    melanoma 2003  . Chest pain   . Constipation   . COPD (chronic obstructive pulmonary disease) ([place])   . Depression   . Diarrhea   . Food allergy   . Heart valve problem   . Heartburn   . History of swelling of feet   . Hyperlipidemia   . Hypertension   . IBS (irritable bowel syndrome)   . Joint pain   . Low back pain   . Melanoma ([place])    right foot s/p lymph node removed right groin  . Obesity (BMI 30-39.9)   . Palpitation   . Prediabetes   . Shortness of breath   . Urticaria   . Vaginal delivery    X 2   Past Surgical History:  Procedure Laterality Date  . BLADDER SUSPENSION  07/2017  . COLONOSCOPY WITH PROPOFOL N/A 07/01/2017   Procedure: COLONOSCOPY WITH PROPOFOL;  Surgeon: Virgel Manifold, MD;  Location: [facility] END[facility]COPY;  Service: Endoscopy;  Laterality: N/A;  . ESOPHAGOGASTRODUODENOSCOPY (EGD) WITH PROPOFOL N/A 07/01/2017   Procedure: ESOPHAGOGASTRODUODENOSCOPY (EGD) WITH PROPOFOL;  Surgeon: Virgel Manifold, MD;  Location: [facility] ENDOSCOPY;  Service: Endoscopy;  Laterality: N/A;  . LEEP  1995  . LEEP    . PARTIAL HYSTERECTOMY  08/2017   s/p partial hysterectomy - per patient HAS cervix, uterus, fallopian tubes; ovaries INTACT. NO CERVIX on exam 03/06/20  . SKIN CANCER EXCISION     melanoma  . TONSILLECTOMY     Family History  Problem Relation Age of Onset  . Hyperlipidemia Mother   . Hypertension Mother   . Diabetes Mother   . Breast cancer Mother 66  . Stroke Mother   . Cancer Mother   . Cancer Father        lung  .  Depression Father   . Diabetes Father   . Hypertension Father   . Hyperlipidemia Father   . Sleep apnea Father   . Alcoholism Father   . Hyperlipidemia Sister   . Hypertension Sister   . Migraines Sister   . Hypertension Brother   . Hyperlipidemia Brother   . Migraines Brother   . Cancer Maternal Grandmother        glioblastoma  . Depression Maternal Grandfather   . Heart disease Maternal Grandfather   . Cancer Paternal Grandfather        unknown  . AAA (abdominal aortic aneurysm) Paternal Grandfather    Social History   Socioeconomic History  . Marital status: Married    Spouse name: Not on file  . Number of children: Not on file  . Years of education: Not on file  . Highest education level: Not on file  Occupational  History  . Occupation: disability  Tobacco Use  . Smoking status: Former Smoker    Packs/day: 1.00    Years: 25.00    Pack years: 25.00    Types: Cigarettes    Quit date: 2008    Years since quitting: 14.1  . Smokeless tobacco: Never Used  Vaping Use  . Vaping Use: Never used  Substance and Sexual Activity  . Alcohol use: No  . Drug use: No  . Sexual activity: Yes  Other Topics Concern  . Not on file  Social History Narrative   Married 25 years.        2 children by 1st husband.       Disabled. Not working.    Right handed      One story home      Social Determinants of Health   Financial Resource Strain: Low Risk   . Difficulty of Paying Living Expenses: Not hard at all  Food Insecurity: No Food Insecurity  . Worried About Charity fundraiser in the Last Year: Never true  . Ran Out of Food in the Last Year: Never true  Transportation Needs: No Transportation Needs  . Lack of Transportation (Medical): No  . Lack of Transportation (Non-Medical): No  Physical Activity: Insufficiently Active  . Days of Exercise per Week: 7 days  . Minutes of Exercise per Session: 10 min  Stress: No Stress Concern Present  . Feeling of Stress : Not at  all  Social Connections: Socially Integrated  . Frequency of Communication with Friends and Family: More than three times a week  . Frequency of Social Gatherings with Friends and Family: Not on file  . Attends Religious Services: 1 to 4 times per year  . Active Member of Clubs or Organizations: Yes  . Attends Archivist Meetings: Not on file  . Marital Status: Married    Tobacco Counseling Counseling given: Not Answered   Clinical Intake:  Pre-visit preparation completed: Yes        Diabetes: No  How often do you need to have someone help you when you read instructions, pamphlets, or other written materials from your doctor or pharmacy?: 1 - Never  Interpreter Needed?: No      Activities of Daily Living In your present state of health, do you have any difficulty performing the following activities: 07/09/2020  Hearing? N  Vision? N  Difficulty concentrating or making decisions? N  Walking or climbing stairs? N  Dressing or bathing? N  Doing errands, shopping? Y  Comment Husband accompanies when running errands  Preparing Food and eating ? N  Using the Toilet? N  In the past six months, have you accidently leaked urine? Y  Comment Managed with daily pad  Do you have problems with loss of bowel control? N  Managing your Medications? N  Managing your Finances? N  Housekeeping or managing your Housekeeping? N  Some recent data might be hidden    Patient Care Team: Burnard Hawthorne, FNP as PCP - General (Family Medicine) Alda Berthold, DO as Consulting Physician (Neurology)  Indicate any recent Medical Services you may have received from other than [facility] providers in the past year (date may be approximate).     Assessment:   This is a routine wellness examination for Brittney Ruiz.  I connected with Brittney Ruiz today by telephone and verified that I am speaking with the correct person using two identifiers. Location patient: home Location provider: work Persons  participating in  the virtual visit: patient, nurse.    I discussed the limitations, risks, security and privacy concerns of performing an evaluation and management service by telephone and the availability of in person appointments. The patient expressed understanding and verbally consented to this telephonic visit.    Interactive audio and video telecommunications were attempted between this provider and patient, however failed, due to patient having technical difficulties OR patient did not have access to video capability.  We continued and completed visit with audio only.  Some vital signs may be absent or patient reported.   Hearing/Vision screen  Hearing Screening   125Hz  250Hz  500Hz  1000Hz  2000Hz  3000Hz  4000Hz  6000Hz  8000Hz   Right ear:           Left ear:           Comments: Patient is able to hear conversational tones without difficulty.  No issues reported.  Vision Screening Comments: Follow[facility] by [facility] Wears corrective lenses Visual acuity not assessed, virtual visit.  They have seen their ophthalmologist in the last 12 months.     Dietary issues and exercise activities discussed: Current Exercise Habits: Home exercise routine, Type of exercise: walking, Time (Minutes): 10, Frequency (Times/Week): 7, Weekly Exercise (Minutes/Week): 70, Intensity: Mild  Healthy diet Good water intake; restricted to 80 ounces  Goals    . Increase physical activity     Walk on the treadmill as tolerated  Use exercise program "walk away the pounds" to encourage weight loss and over all health      Depression Screen PHQ 2/9 Scores 07/09/2020 06/14/2020 07/07/2019 06/01/2019 02/10/2019 06/28/2018 05/02/2018  PHQ - 2 Score - - - 6 0 2 2  PHQ- 9 Score - - - 23 - 5 7  Exception Documentation Other- indicate reason in comment box Medical reason Other- indicate reason in comment box - - - -  Not completed Psychiatry/counseling every 3 months - Managed by psychiatry every 3 months - - - -     Fall Risk Fall Risk  07/09/2020 08/08/2019 07/07/2019 05/01/2019 02/10/2019  Falls in the past year? 0 0 0 0 0  Number falls in past yr: 0 - - 0 0  Injury with Fall? 0 - - 0 -  Follow up Falls evaluation completed Falls evaluation completed Falls evaluation completed - -    FALL RISK PREVENTION PERTAINING TO THE HOME: Handrails in use when climbing stairs? Yes Home free of loose throw rugs in walkways, pet beds, electrical cords, etc? Yes  Adequate lighting in your home to reduce risk of falls? Yes   ASSISTIVE DEVICES UTILIZED TO PREVENT FALLS: Life alert? No  Use of a cane, walker or w/c? No   TIMED UP AND GO: Was the test performed? No . Virtual visit.   Cognitive Function: Patient is alert and oriented x3.  Denies difficulty focusing, making decisions, memory loss.  Enjoys knitting with missions work, Bible Study MMSE/6CIT deferred. Normal by direct communication/observation.  MMSE - Mini Mental State Exam 06/25/2017  Orientation to time 5  Orientation to Place 5  Registration 3  Attention/ Calculation 5  Recall 3  Language- name 2 objects 2  Language- repeat 1  Language- follow 3 step command 3  Language- read & follow direction 1  Write a sentence 1  Copy design 1  Total score 30     6CIT Screen 07/09/2020 07/07/2019 06/28/2018 06/24/2016  What Year? 0 points 0 points 0 points 0 points  What month? 0 points 0 points 0 points  0 points  What time? 0 points 0 points 0 points 0 points  Count back from 20 - 0 points 0 points 0 points  Months in reverse 0 points 0 points 0 points 0 points  Repeat phrase - 0 points 0 points 0 points  Total Score - 0 0 0    Immunizations Immunization History  Administered Date(s) Administered  . Influenza Inj Mdck Quad Pf 03/05/2017, 03/22/2019  . Influenza Split 04/17/2013  . Influenza Whole 05/18/2008, 05/29/2011  . Influenza-Unspecified 02/16/2016, 03/05/2017, 05/06/2020  . PFIZER(Purple Top)SARS-COV-2 Vaccination 08/14/2019,  09/06/2019, 03/27/2020  . Pneumococcal Polysaccharide-23 05/02/2018  . Td 05/18/1997  . Tdap 07/16/2009, 04/17/2013   Health Maintenance There are no preventive care reminders to display for this patient. Health Maintenance  Topic Date Due  . COVID-19 Vaccine (4 - Booster for Pfizer series) 09/24/2020  . MAMMOGRAM  05/06/2022  . COLONOSCOPY (Pts 45-54yr Insurance coverage will need to be confirmed)  07/01/2022  . PAP SMEAR-Modifier  03/07/2023  . TETANUS/TDAP  04/18/2023  . INFLUENZA VACCINE  Completed  . Hepatitis C Screening  Completed  . HIV Screening  Completed   Colorectal cancer screening: Type of screening: Colonoscopy. Completed 07/01/17. Repeat every 5 years  Mammogram status: Completed 05/06/20. Repeat every year  Lung Cancer Screening: (Low Dose CT Chest recommended if Age 329-80years, 30 pack-year currently smoking OR have quit w/in 15years.) does qualify. Completed 05/06/20.  Vision Screening: Recommended annual ophthalmology exams for early detection of glaucoma and other disorders of the eye. Is the patient up to date with their annual eye exam?  Yes  Who is the provider or what is the name of the office in which the patient attends annual [facility] exams[facility]  Dental Screening: Recommended annual dental exams for proper oral hygiene. [employer] Partial denture.   Community Resource Referral / Chronic Care Management: CRR required this visit?  No   CCM required this visit?  No      Plan:   Keep all routine maintenance appointments.   I have personally reviewed and noted the following in the patient's chart:   . Medical and social history . Use of alcohol, tobacco or illicit drugs  . Current medications and supplements . Functional ability and status . Nutritional status . Physical activity . Advanced directives . List of other physicians . Hospitalizations, surgeries, and ER visits in previous 12 months . Vitals . Screenings to include  cognitive, depression, and falls . Referrals and appointments  In addition, I have reviewed and discussed with patient certain preventive protocols, quality metrics, and best practice recommendations. A written personalized care plan for preventive services as well as general preventive health recommendations were provided to patient via mychart.     OVarney Biles LPN   21/19/1478     Agree with plan. MMable [place] NP

## 2020-07-09 NOTE — Patient Instructions (Addendum)
Brittney Ruiz , Thank you for taking time to come for your Medicare Wellness Visit. I appreciate your ongoing commitment to your health goals. Please review the following plan we discussed and let me know if I can assist you in the future.   These are the goals we discussed: Goals    . Increase physical activity     Walk on the treadmill as tolerated  Use exercise program "walk away the pounds" to encourage weight loss and over all health       This is a list of the screening recommended for you and due dates:  Health Maintenance  Topic Date Due  . COVID-19 Vaccine (4 - Booster for Pfizer series) 09/24/2020  . Mammogram  05/06/2022  . Colon Cancer Screening  07/01/2022  . Pap Smear  03/07/2023  . Tetanus Vaccine  04/18/2023  . Flu Shot  Completed  .  Hepatitis C: One time screening is recommended by Center for Disease Control  (CDC) for  adults born from [birth year] through 1964.   Completed  . HIV Screening  Completed    Immunizations Immunization History  Administered Date(s) Administered  . Influenza Inj Mdck Quad Pf 03/05/2017, 03/22/2019  . Influenza Split 04/17/2013  . Influenza Whole 05/18/2008, 05/29/2011  . Influenza-Unspecified 02/16/2016, 03/05/2017, 05/06/2020  . PFIZER(Purple Top)SARS-COV-2 Vaccination 08/14/2019, 09/06/2019, 03/27/2020  . Pneumococcal Polysaccharide-23 05/02/2018  . Td 05/18/1997  . Tdap 07/16/2009, 04/17/2013   Advanced directives:   Follow up in one year for your annual wellness visit.   Preventive Care 40-64 Years, Female Preventive care refers to lifestyle choices and visits with your health care provider that can promote health and wellness. What does preventive care include?  A yearly physical exam. This is also called an annual well check.  Dental exams once or twice a year.  Routine eye exams. Ask your health care provider how often you should have your eyes checked.  Personal lifestyle choices, including:  Daily care of your teeth and  gums.  Regular physical activity.  Eating a healthy diet.  Avoiding tobacco and drug use.  Limiting alcohol use.  Practicing safe sex.  Taking low-dose aspirin daily starting at age 41.  Taking vitamin and mineral supplements as recommended by your health care provider. What happens during an annual well check? The services and screenings done by your health care provider during your annual well check will depend on your age, overall health, lifestyle risk factors, and family history of disease. Counseling  Your health care provider may ask you questions about your:  Alcohol use.  Tobacco use.  Drug use.  Emotional well-being.  Home and relationship well-being.  Sexual activity.  Eating habits.  Work and work Astron[employer].  Method of birth control.  Menstrual cycle.  Pregnancy history. Screening  You may have the following tests or measurements:  Height, weight, and BMI.  Blood pressure.  Lipid and cholesterol levels. These may be checked every 5 years, or more frequently if you are over 71 years old.  Skin check.  Lung cancer screening. You may have this screening every year starting at age 37 if you have a 30-pack-year history of smoking and currently smoke or have quit within the past 15 years.  Fecal occult blood test (FOBT) of the stool. You may have this test every year starting at age 44.  Flexible sigmoidoscopy or colonoscopy. You may have a sigmoidoscopy every 5 years or a colonoscopy every 10 years starting at age 38.  Hepatitis C blood  test.  Hepatitis B blood test.  Sexually transmitted disease (STD) testing.  Diabetes screening. This is done by checking your blood sugar (glucose) after you have not eaten for a while (fasting). You may have this done every 1-3 years.  Mammogram. This may be done every 1-2 years. Talk to your health care provider about when you should start having regular mammograms. This may depend on whether you have a  family history of breast cancer.  BRCA-related cancer screening. This may be done if you have a family history of breast, ovarian, tubal, or peritoneal cancers.  Pelvic exam and Pap test. This may be done every 3 years starting at age 64. Starting at age 66, this may be done every 5 years if you have a Pap test in combination with an HPV test.  Bone density scan. This is done to screen for osteoporosis. You may have this scan if you are at high risk for osteoporosis. Discuss your test results, treatment options, and if necessary, the need for more tests with your health care provider. Vaccines  Your health care provider may recommend certain vaccines, such as:  Influenza vaccine. This is recommended every year.  Tetanus, diphtheria, and acellular pertussis (Tdap, Td) vaccine. You may need a Td booster every 10 years.  Zoster vaccine. You may need this after age 38.  Pneumococcal 13-valent conjugate (PCV13) vaccine. You may need this if you have certain conditions and were not previously vaccinated.  Pneumococcal polysaccharide (PPSV23) vaccine. You may need one or two doses if you smoke cigarettes or if you have certain conditions. Talk to your health care provider about which screenings and vaccines you need and how often you need them. This information is not intended to replace advice given to you by your health care provider. Make sure you discuss any questions you have with your health care provider. Document Released: 05/31/2015 Document Revised: 01/22/2016 Document Reviewed: 03/05/2015 Elsevier Interactive Patient Education  2017 [employer] Prevention in the Home Falls can cause injuries. They can happen to people of all ages. There are many things you can do to make your home safe and to help prevent falls. What can I do on the outside of my home?  Regularly fix the edges of walkways and driveways and fix any cracks.  Remove anything that might make you trip as  you walk through a door, such as a raised step or threshold.  Trim any bushes or trees on the path to your home.  Use bright outdoor lighting.  Clear any walking paths of anything that might make someone trip, such as rocks or tools.  Regularly check to see if handrails are loose or broken. Make sure that both sides of any steps have handrails.  Any raised decks and porches should have guardrails on the edges.  Have any leaves, snow, or ice cleared regularly.  Use sand or salt on walking paths during winter.  Clean up any spills in your garage right away. This includes oil or grease spills. What can I do in the bathroom?  Use night lights.  Install grab bars by the toilet and in the tub and shower. Do not use towel bars as grab bars.  Use non-skid mats or decals in the tub or shower.  If you need to sit down in the shower, use a plastic, non-slip stool.  Keep the floor dry. Clean up any water that spills on the floor as soon as it happens.  Remove  soap buildup in the tub or shower regularly.  Attach bath mats securely with double-sided non-slip rug tape.  Do not have throw rugs and other things on the floor that can make you trip. What can I do in the bedroom?  Use night lights.  Make sure that you have a light by your bed that is easy to reach.  Do not use any sheets or blankets that are too big for your bed. They should not hang down onto the floor.  Have a firm chair that has side arms. You can use this for support while you get dressed.  Do not have throw rugs and other things on the floor that can make you trip. What can I do in the kitchen?  Clean up any spills right away.  Avoid walking on wet floors.  Keep items that you use a lot in easy-to-reach places.  If you need to reach something above you, use a strong step stool that has a grab bar.  Keep electrical cords out of the way.  Do not use floor polish or wax that makes floors slippery. If you must  use wax, use non-skid floor wax.  Do not have throw rugs and other things on the floor that can make you trip. What can I do with my stairs?  Do not leave any items on the stairs.  Make sure that there are handrails on both sides of the stairs and use them. Fix handrails that are broken or loose. Make sure that handrails are as long as the stairways.  Check any carpeting to make sure that it is firmly attached to the stairs. Fix any carpet that is loose or worn.  Avoid having throw rugs at the top or bottom of the stairs. If you do have throw rugs, attach them to the floor with carpet tape.  Make sure that you have a light switch at the top of the stairs and the bottom of the stairs. If you do not have them, ask someone to add them for you. What else can I do to help prevent falls?  Wear shoes that:  Do not have high heels.  Have rubber bottoms.  Are comfortable and fit you well.  Are closed at the toe. Do not wear sandals.  If you use a stepladder:  Make sure that it is fully opened. Do not climb a closed stepladder.  Make sure that both sides of the stepladder are locked into place.  Ask someone to hold it for you, if possible.  Clearly mark and make sure that you can see:  Any grab bars or handrails.  First and last steps.  Where the edge of each step is.  Use tools that help you move around (mobility aids) if they are needed. These include:  Canes.  Walkers.  Scooters.  Crutches.  Turn on the lights when you go into a dark area. Replace any light bulbs as soon as they burn out.  Set up your furniture so you have a clear path. Avoid moving your furniture around.  If any of your floors are uneven, fix them.  If there are any pets around you, be aware of where they are.  Review your medicines with your doctor. Some medicines can make you feel dizzy. This can increase your chance of falling. Ask your doctor what other things that you can do to help prevent  falls. This information is not intended to replace advice given to you by your health care  provider. Make sure you discuss any questions you have with your health care provider. Document Released: 02/28/2009 Document Revised: 10/10/2015 Document Reviewed: 06/08/2014 Elsevier Interactive Patient Education  2017 [employer].

## 2020-07-10 ENCOUNTER — Encounter: Payer: Self-pay | Admitting: Emergency Medicine

## 2020-07-10 ENCOUNTER — Emergency Department: Payer: Medicare HMO

## 2020-07-10 ENCOUNTER — Telehealth: Payer: Self-pay | Admitting: Family

## 2020-07-10 ENCOUNTER — Other Ambulatory Visit: Payer: Self-pay

## 2020-07-10 ENCOUNTER — Emergency Department
Admission: EM | Admit: 2020-07-10 | Discharge: 2020-07-10 | Disposition: A | Discharge status: Home / Self Care | Payer: Medicare HMO | Attending: Emergency Medicine | Admitting: Emergency Medicine

## 2020-07-10 DIAGNOSIS — I10 Essential (primary) hypertension: Secondary | ICD-10-CM | POA: Diagnosis not present

## 2020-07-10 DIAGNOSIS — Z85828 Personal history of other malignant neoplasm of skin: Secondary | ICD-10-CM | POA: Insufficient documentation

## 2020-07-10 DIAGNOSIS — Z87891 Personal history of nicotine dependence: Secondary | ICD-10-CM | POA: Insufficient documentation

## 2020-07-10 DIAGNOSIS — R519 Headache, unspecified: Secondary | ICD-10-CM | POA: Diagnosis not present

## 2020-07-10 DIAGNOSIS — H81392 Other peripheral vertigo, left ear: Secondary | ICD-10-CM | POA: Diagnosis not present

## 2020-07-10 DIAGNOSIS — Z7982 Long term (current) use of aspirin: Secondary | ICD-10-CM | POA: Diagnosis not present

## 2020-07-10 DIAGNOSIS — J449 Chronic obstructive pulmonary disease, unspecified: Secondary | ICD-10-CM | POA: Insufficient documentation

## 2020-07-10 DIAGNOSIS — R42 Dizziness and giddiness: Secondary | ICD-10-CM | POA: Diagnosis not present

## 2020-07-10 DIAGNOSIS — Z79899 Other long term (current) drug therapy: Secondary | ICD-10-CM | POA: Insufficient documentation

## 2020-07-10 DIAGNOSIS — M542 Cervicalgia: Secondary | ICD-10-CM | POA: Diagnosis not present

## 2020-07-10 DIAGNOSIS — Z8616 Personal history of COVID-19: Secondary | ICD-10-CM | POA: Insufficient documentation

## 2020-07-10 DIAGNOSIS — M25511 Pain in right shoulder: Secondary | ICD-10-CM | POA: Diagnosis not present

## 2020-07-10 DIAGNOSIS — J45909 Unspecified asthma, uncomplicated: Secondary | ICD-10-CM | POA: Insufficient documentation

## 2020-07-10 LAB — COMPREHENSIVE METABOLIC PANEL
ALT: 46 U/L — ABNORMAL HIGH (ref 0–44)
AST: 30 U/L (ref 15–41)
Albumin: 4.4 g/dL (ref 3.5–5.0)
Alkaline Phosphatase: 74 U/L (ref 38–126)
Anion gap: 8 (ref 5–15)
BUN: 26 mg/dL — ABNORMAL HIGH (ref 6–20)
CO2: 27 mmol/L (ref 22–32)
Calcium: 9.5 mg/dL (ref 8.9–10.3)
Chloride: 104 mmol/L (ref 98–111)
Creatinine, Ser: 0.58 mg/dL (ref 0.44–1.00)
GFR, Estimated: >60 mL/min (ref >60)
Glucose, Bld: 125 mg/dL — ABNORMAL HIGH (ref 70–99)
Potassium: 4.1 mmol/L (ref 3.5–5.1)
Sodium: 139 mmol/L (ref 135–145)
Total Bilirubin: 0.8 mg/dL (ref 0.3–1.2)
Total Protein: 7.5 g/dL (ref 6.5–8.1)

## 2020-07-10 LAB — CBC
HCT: 46.4 % — ABNORMAL HIGH (ref 36.0–46.0)
Hemoglobin: 15.9 g/dL — ABNORMAL HIGH (ref 12.0–15.0)
MCH: 28.8 pg (ref 26.0–34.0)
MCHC: 34.3 g/dL (ref 30.0–36.0)
MCV: 83.9 fL (ref 80.0–100.0)
Platelets: 186 10*3/uL (ref 150–400)
RBC: 5.53 MIL/uL — ABNORMAL HIGH (ref 3.87–5.11)
RDW: 13.4 % (ref 11.5–15.5)
WBC: 9 10*3/uL (ref 4.0–10.5)
nRBC: 0 % (ref 0.0–0.2)

## 2020-07-10 LAB — DIFFERENTIAL
Abs Immature Granulocytes: 0.03 10*3/uL (ref 0.00–0.07)
Basophils Absolute: 0 10*3/uL (ref 0.0–0.1)
Basophils Relative: 0 %
Eosinophils Absolute: 0.1 10*3/uL (ref 0.0–0.5)
Eosinophils Relative: 1 %
Immature Granulocytes: 0 %
Lymphocytes Relative: 21 %
Lymphs Abs: 1.8 10*3/uL (ref 0.7–4.0)
Monocytes Absolute: 0.3 10*3/uL (ref 0.1–1.0)
Monocytes Relative: 4 %
Neutro Abs: 6.7 10*3/uL (ref 1.7–7.7)
Neutrophils Relative %: 74 %

## 2020-07-10 MED ORDER — DIAZEPAM 5 MG PO TABS
5.0000 mg | ORAL_TABLET | Freq: Once | ORAL | Status: AC
  Administered 2020-07-10: 5 mg via ORAL
  Filled 2020-07-10: qty 1

## 2020-07-10 MED ORDER — DIAZEPAM 2 MG PO TABS: 2.0000 mg | ORAL_TABLET | Freq: Two times a day (BID) | ORAL | 0 refills | Status: DC | PRN

## 2020-07-10 MED ORDER — ACETAMINOPHEN 500 MG PO TABS
1000.0000 mg | ORAL_TABLET | Freq: Once | ORAL | Status: AC
  Administered 2020-07-10: 1000 mg via ORAL
  Filled 2020-07-10: qty 2

## 2020-07-10 MED ORDER — IBUPROFEN 600 MG PO TABS
600.0000 mg | ORAL_TABLET | Freq: Once | ORAL | Status: AC
  Administered 2020-07-10: 600 mg via ORAL
  Filled 2020-07-10: qty 1

## 2020-07-10 NOTE — ED Provider Notes (Signed)
[facility] Emergency Department Provider Note  ____________________________________________   Event Date/Time   First MD Initiated Contact with Patient 07/10/20 1827     (approximate)  I have reviewed the triage vital signs and the nursing notes.   HISTORY  Chief Complaint Dizziness    HPI Brittney Ruiz is a 55 y.o. female with past medical history of COPD, vertigo, here with dizziness.  The patient states that her symptoms started today.  This afternoon, she sat up from bed and became acutely dizzy.  She felt like she was falling off the bed.  She laid back down and tried to sit back up again with recurrence of severe dizziness.  She felt like the room was spinning.  She was unable to walk.  She states the symptoms have persisted, though slightly improved while waiting in the ED.  She states that she has been having increasing episodes similar to this over the last several months.  She has a history of known vertigo but has never had vestibular rehab.  She has seen ENT before.  She denies any difficulty speaking or swallowing.  She also notes that she had Covid several weeks ago, and since then has been having some moderate right paraspinal neck pain with some pain radiating towards her shoulder.  This is also new for her.  No numbness or weakness.        Past Medical History:  Diagnosis Date  . Abnormal Pap smear of cervix    h/o LEEP  . Allergy   . Angio-edema   . Anxiety   . Asthma   . Bladder leak   . Cancer ([place])    melanoma 2003  . Chest pain   . Constipation   . COPD (chronic obstructive pulmonary disease) ([place])   . Depression   . Diarrhea   . Food allergy   . Heart valve problem   . Heartburn   . History of swelling of feet   . Hyperlipidemia   . Hypertension   . IBS (irritable bowel syndrome)   . Joint pain   . Low back pain   . Melanoma ([place])    right foot s/p lymph node removed right groin  . Obesity (BMI 30-39.9)   .  Palpitation   . Prediabetes   . Shortness of breath   . Urticaria   . Vaginal delivery    X 2    Patient Active Problem List   Diagnosis Date Noted  . Acute bacterial sinusitis 04/05/2020  . Acute bronchitis with asthma 04/05/2020  . Urinary urgency 03/06/2020  . HTN (hypertension) 07/03/2019  . Class 2 severe obesity with serious comorbidity and body mass index (BMI) of 39.0 to 39.9 in adult ([place]) 06/04/2019  . Chronic pain of both knees 06/04/2019  . Numbness of left arm and leg 02/10/2019  . Asthma 09/16/2018  . Left foot pain 05/02/2018  . Chronic midline low back pain 10/13/2017  . Prolapse of female genital organs 10/05/2017  . Uterovaginal prolapse 08/17/2017  . Special screening for malignant neoplasms, colon   . Polyp of sigmoid colon   . Internal hemorrhoids   . Diverticulosis of large intestine without diverticulitis   . Gastric polyp   . Esophageal thickening   . Chronic constipation 08/20/2016  . Bronchitis 08/20/2016  . Routine physical examination 04/20/2016  . Lesion of cervix 04/20/2016  . Mitral valve regurgitation 09/17/2015  . Pain in the chest 08/09/2015  . Breast lesion 08/09/2015  .  Acid reflux 12/20/2012  . Allergic rhinitis due to pollen 12/05/2012  . Malignant melanoma of foot ([place]) 11/30/2012  . Vertigo 09/22/2012  . Menorrhagia 08/12/2012  . HLD (hyperlipidemia) 08/12/2012  . Medicare annual wellness visit, subsequent 04/21/2012  . Depression   . Anxiety     Past Surgical History:  Procedure Laterality Date  . BLADDER SUSPENSION  07/2017  . COLONOSCOPY WITH PROPOFOL N/A 07/01/2017   Procedure: COLONOSCOPY WITH PROPOFOL;  Surgeon: Virgel Manifold, MD;  Location: [facility] ENDOSCOPY;  Service: Endoscopy;  Laterality: N/A;  . ESOPHAGOGASTRODUODENOSCOPY (EGD) WITH PROPOFOL N/A 07/01/2017   Procedure: ESOPHAGOGASTRODUODENOSCOPY (EGD) WITH PROPOFOL;  Surgeon: Virgel Manifold, MD;  Location: [facility] ENDOSCOPY;  Service: Endoscopy;  Laterality:  N/A;  . LEEP  1995  . LEEP    . PARTIAL HYSTERECTOMY  08/2017   s/p partial hysterectomy - per patient HAS cervix, uterus, fallopian tubes; ovaries INTACT. NO CERVIX on exam 03/06/20  . SKIN CANCER EXCISION     melanoma  . TONSILLECTOMY      Prior to Admission medications   Medication Sig Start Date End Date Taking? Authorizing Provider  albuterol (PROVENTIL) (2.5 MG/3ML) 0.083% nebulizer solution Take 3 mLs (2.5 mg total) by nebulization every 6 (six) hours as needed for wheezing or shortness of breath. 06/14/20  Yes Arnett, Yvetta Coder, FNP  albuterol (VENTOLIN HFA) 108 (90 Base) MCG/ACT inhaler Inhale 2 puffs into the lungs every 6 (six) hours as needed. 04/08/20  Yes Marval Regal, NP  aspirin EC 81 MG tablet Take 81 mg by mouth at bedtime.   Yes [provider]  B Complex Vitamins (BL VITAMIN B COMPLEX PO) +Folic acid qd. 40/08/67  Yes [provider]  Cholecalciferol (VITAMIN D-3) 25 MCG (1000 UT) CAPS Take 1 capsule by mouth daily.   Yes [provider]  clorazepate (TRANXENE) 7.5 MG tablet Take 7.5 mg by mouth 3 (three) times daily.   Yes [provider]  diazepam (VALIUM) 2 MG tablet Take 1 tablet (2 mg total) by mouth every 12 (twelve) hours as needed (vertigo). 07/10/20 07/10/21 Yes Duffy Bruce, MD  ezetimibe (ZETIA) 10 MG tablet Take 2.5 mg by mouth once a week. Tuesday morning   Yes [provider]  fexofenadine (ALLEGRA) 60 MG tablet Take 60 mg by mouth daily.   Yes [provider]  fluticasone (FLONASE) 50 MCG/ACT nasal spray USE 2 SPRAYS INTO BOTH NOSTRILS DAILY. Patient taking differently: Place 1 spray into both nostrils at bedtime. 12/08/18  Yes Burnard Hawthorne, FNP  Multiple Vitamin (MULTIVITAMIN) tablet Take 1 tablet by mouth daily.   Yes [provider]  Omega-3 Fatty Acids (FISH OIL) 1000 MG CAPS Take 1 capsule by mouth daily.   Yes [provider]  omeprazole (PRILOSEC) 20 MG capsule TAKE 1  CAPSULE EVERY DAY 02/14/20  Yes Arnett, Yvetta Coder, FNP  polyethylene glycol powder (GLYCOLAX/MIRALAX) 17 GM/SCOOP powder Take 1 Container by mouth at bedtime.   Yes [provider]  propranolol (INDERAL) 20 MG tablet Take 1 tablet (20 mg total) by mouth 3 (three) times daily. 07/16/15  Yes Jackolyn Confer, MD  psyllium (METAMUCIL) 58.6 % powder Take 1 packet by mouth daily.   Yes [provider]  sertraline (ZOLOFT) 100 MG tablet TAKE 1+1/2 TABLETS BY MOUTH DAILY AS DIRECTED Patient taking differently: Take 112.5 mg by mouth See admin instructions. 112.5 in the morning and 25 mg in the evening 06/14/14  Yes Jackolyn Confer, MD  sertraline (  ZOLOFT) 25 MG tablet Take 25 mg by mouth every evening.  08/08/18  Yes [provider]    Allergies Elemental sulfur, Pork allergy, Pork-derived products, Aciphex [rabeprazole sodium], Antivert [meclizine hcl], Buspar [buspirone hcl], Cat hair extract, Chlorpheniramine-pseudoeph, Chlorpheniramine-pseudoeph, Chocolate flavor, Codeine, Crestor [rosuvastatin calcium], Cymbalta [duloxetine hcl], Duloxetine, Effexor xr [venlafaxine hydrochloride], Epinephrine, Erythromycin, Escitalopram oxalate, Lamictal [lamotrigine], Levofloxacin, Lipitor [atorvastatin], Meclizine, Mometasone furoate, Mometasone furoate, Monosodium glutamate, Neurontin [gabapentin], Other, Prednisone, Shellfish allergy, Sudafed [pseudoephedrine hcl], Sulfa antibiotics, Sulfasalazine, Valium, Wellbutrin [bupropion hcl], Wixela inhub [fluticasone-salmeterol], Xanax xr [alprazolam], Zetia [ezetimibe], Alfalfa, Brompheniramine-phenylephrine, Buspirone, and Tape  Family History  Problem Relation Age of Onset  . Hyperlipidemia Mother   . Hypertension Mother   . Diabetes Mother   . Breast cancer Mother 64  . Stroke Mother   . Cancer Mother   . Cancer Father        lung  . Depression Father   . Diabetes Father   . Hypertension Father   . Hyperlipidemia Father   . Sleep  apnea Father   . Alcoholism Father   . Hyperlipidemia Sister   . Hypertension Sister   . Migraines Sister   . Hypertension Brother   . Hyperlipidemia Brother   . Migraines Brother   . Cancer Maternal Grandmother        glioblastoma  . Depression Maternal Grandfather   . Heart disease Maternal Grandfather   . Cancer Paternal Grandfather        unknown  . AAA (abdominal aortic aneurysm) Paternal Grandfather     Social History Social History   Tobacco Use  . Smoking status: Former Smoker    Packs/day: 1.00    Years: 25.00    Pack years: 25.00    Types: Cigarettes    Quit date: 2008    Years since quitting: 14.1  . Smokeless tobacco: Never Used  Vaping Use  . Vaping Use: Never used  Substance Use Topics  . Alcohol use: No  . Drug use: No    Review of Systems  Review of Systems  Constitutional: Positive for fatigue. Negative for fever.  HENT: Negative for congestion and sore throat.   Eyes: Negative for visual disturbance.  Respiratory: Negative for cough and shortness of breath.   Cardiovascular: Negative for chest pain.  Gastrointestinal: Positive for nausea and vomiting. Negative for abdominal pain and diarrhea.  Genitourinary: Negative for flank pain.  Musculoskeletal: Negative for back pain and neck pain.  Skin: Negative for rash and wound.  Neurological: Positive for dizziness. Negative for weakness.  All other systems reviewed and are negative.    ____________________________________________  PHYSICAL EXAM:      VITAL SIGNS: ED Triage Vitals  Enc Vitals Group     BP 07/10/20 1556 (!) 124/94     Pulse Rate 07/10/20 1556 76     Resp 07/10/20 1556 18     Temp 07/10/20 1556 99.2 F (37.3 C)     Temp Source 07/10/20 1556 Oral     SpO2 07/10/20 1556 97 %     Weight 07/10/20 1547 180 lb (81.6 kg)     Height 07/10/20 1547 5\' 2"  (1.575 m)     Head Circumference --      Peak Flow --      Pain Score 07/10/20 1546 4     Pain Loc --      Pain Edu? --       Excl. in [place]? --      Physical Exam Vitals and  nursing note reviewed.  Constitutional:      General: She is not in acute distress.    Appearance: She is well-developed.  HENT:     Head: Normocephalic and atraumatic.  Eyes:     Conjunctiva/sclera: Conjunctivae normal.  Cardiovascular:     Rate and Rhythm: Normal rate and regular rhythm.     Heart sounds: Normal heart sounds. No murmur heard. No friction rub.  Pulmonary:     Effort: Pulmonary effort is normal. No respiratory distress.     Breath sounds: Normal breath sounds. No wheezing or rales.  Abdominal:     General: There is no distension.     Palpations: Abdomen is soft.     Tenderness: There is no abdominal tenderness.  Musculoskeletal:     Cervical back: Neck supple.  Skin:    General: Skin is warm.     Capillary Refill: Capillary refill takes less than 2 seconds.  Neurological:     Mental Status: She is alert and oriented to person, place, and time.     Motor: No abnormal muscle tone.      Neurological Exam:  Mental Status: Alert and oriented to person, place, and time. Attention and concentration normal. Speech clear. Recent memory is intact. Cranial Nerves: Visual fields grossly intact. EOMI and PERRLA.  Fatigable left word nystagmus noted that is fatigued after 20 to 30 seconds.. Facial sensation intact at forehead, maxillary cheek, and chin/mandible bilaterally. No facial asymmetry or weakness. Hearing grossly normal. Uvula is midline, and palate elevates symmetrically. Normal SCM and trapezius strength. Tongue midline without fasciculations. Motor: Muscle strength 5/5 in proximal and distal UE and LE bilaterally. No pronator drift. Muscle tone normal. Sensation: Intact to light touch in upper and lower extremities distally bilaterally.  Gait: Normal without ataxia. Coordination: Normal FTN bilaterally.    ____________________________________________   LABS (all labs ordered are listed, but only abnormal  results are displayed)  Labs Reviewed  CBC - Abnormal; Notable for the following components:      Result Value   RBC 5.53 (*)    Hemoglobin 15.9 (*)    HCT 46.4 (*)    All other components within normal limits  COMPREHENSIVE METABOLIC PANEL - Abnormal; Notable for the following components:   Glucose, Bld 125 (*)    BUN 26 (*)    ALT 46 (*)    All other components within normal limits  DIFFERENTIAL    ____________________________________________  EKG: Normal sinus rhythm, ventricular rate 81.  PR 156, QRS 94, QTc 441.  No acute ST elevations or depressions. ________________________________________  RADIOLOGY All imaging, including plain films, CT scans, and ultrasounds, independently reviewed by me, and interpretations confirmed via formal radiology reads.  ED MD interpretation:     Official radiology report(s): CT Head Wo Contrast  Result Date: 07/10/2020 CLINICAL DATA:  New or worsening headache.  Vertigo. EXAM: CT HEAD WITHOUT CONTRAST CT CERVICAL SPINE WITHOUT CONTRAST TECHNIQUE: Multidetector CT imaging of the head and cervical spine was performed following the standard protocol without intravenous contrast. Multiplanar CT image reconstructions of the cervical spine were also generated. COMPARISON:  MRI 02/10/2019 FINDINGS: CT HEAD FINDINGS Brain: The brain shows a normal appearance without evidence of malformation, atrophy, old or acute small or large vessel infarction, mass lesion, hemorrhage, hydrocephalus or extra-axial collection. Vascular: There is atherosclerotic calcification of the major vessels at the base of the brain. Skull: Normal.  No traumatic finding.  No focal bone lesion. Sinuses/Orbits: Sinuses are clear. Orbits appear normal.  Mastoids are clear. Other: None significant CT CERVICAL SPINE FINDINGS Alignment: Normal Skull base and vertebrae: Normal.  No fracture or focal bone lesion. Soft tissues and spinal canal: Normal Disc levels: No evidence of significant  degenerative spondylosis. No canal or foraminal narrowing. Very minimal facet osteoarthritis. Upper chest: Normal Other: None IMPRESSION: HEAD CT: Normal except for some atherosclerotic calcification of the major vessels at the base of the brain. CERVICAL SPINE CT: Normal. Electronically Signed   By: Nelson Chimes M.D.   On: 07/10/2020 20:57   CT Cervical Spine Wo Contrast  Result Date: 07/10/2020 CLINICAL DATA:  New or worsening headache.  Vertigo. EXAM: CT HEAD WITHOUT CONTRAST CT CERVICAL SPINE WITHOUT CONTRAST TECHNIQUE: Multidetector CT imaging of the head and cervical spine was performed following the standard protocol without intravenous contrast. Multiplanar CT image reconstructions of the cervical spine were also generated. COMPARISON:  MRI 02/10/2019 FINDINGS: CT HEAD FINDINGS Brain: The brain shows a normal appearance without evidence of malformation, atrophy, old or acute small or large vessel infarction, mass lesion, hemorrhage, hydrocephalus or extra-axial collection. Vascular: There is atherosclerotic calcification of the major vessels at the base of the brain. Skull: Normal.  No traumatic finding.  No focal bone lesion. Sinuses/Orbits: Sinuses are clear. Orbits appear normal. Mastoids are clear. Other: None significant CT CERVICAL SPINE FINDINGS Alignment: Normal Skull base and vertebrae: Normal.  No fracture or focal bone lesion. Soft tissues and spinal canal: Normal Disc levels: No evidence of significant degenerative spondylosis. No canal or foraminal narrowing. Very minimal facet osteoarthritis. Upper chest: Normal Other: None IMPRESSION: HEAD CT: Normal except for some atherosclerotic calcification of the major vessels at the base of the brain. CERVICAL SPINE CT: Normal. Electronically Signed   By: Nelson Chimes M.D.   On: 07/10/2020 20:57    ____________________________________________  PROCEDURES   Procedure(s) performed (including Critical  Care):  Procedures  ____________________________________________  INITIAL IMPRESSION / MDM / [place] / ED COURSE  As part of my medical decision making, I reviewed the following data within the [name] notes reviewed and incorporated, Old chart reviewed, Notes from prior ED visits, and [place] Controlled Substance Database       *AYAUNA MCNAY was evaluated in Emergency Department on 07/10/2020 for the symptoms described in the history of present illness. She was evaluated in the context of the global COVID-19 pandemic, which necessitated consideration that the patient might be at risk for infection with the SARS-CoV-2 virus that causes COVID-19. Institutional protocols and algorithms that pertain to the evaluation of patients at risk for COVID-19 are in a state of rapid change based on information released by regulatory bodies including the CDC and federal and state organizations. These policies and algorithms were followed during the patient's care in the ED.  Some ED evaluations and interventions may be delayed as a result of limited staffing during the pandemic.*     Medical Decision Making: 55 year old female here with dizziness and vertigo.  Patient has a history of vertigo and symptoms are consistent with likely leftward peripheral vertigo.  No dysphagia, dysarthria, or signs of central etiology.  This is a well-known issue.  Screening lab work is unremarkable, with unremarkable CBC and CMP.  No fevers or signs of meningitis or encephalitis.  Patient given Valium and Epley maneuver performed with improvement in her vertigo.  Of note, she also reports some neck and base of the skull pain, since she had Covid.  I suspect this could be  radicular pain due to a cervical disc that was exacerbated in the setting of her coughing, but will check CT.  CT head and C-spine unremarkable.  Vertigo improved after Epley maneuver.  Given her persistent symptoms and extensive  history of this, do think she may benefit from outpatient ENT referral and possible vestibular rehab.  Will provide referral.  Otherwise, patient able to ambulate and feels better.  Discharge home. Will give small amoutn of valium PRN severe vertigo. ____________________________________________  FINAL CLINICAL IMPRESSION(S) / ED DIAGNOSES  Final diagnoses:  Peripheral vertigo involving left ear     MEDICATIONS GIVEN DURING THIS VISIT:  Medications  diazepam (VALIUM) tablet 5 mg (5 mg Oral Given 07/10/20 1923)  acetaminophen (TYLENOL) tablet 1,000 mg (1,000 mg Oral Given 07/10/20 2110)  ibuprofen (ADVIL) tablet 600 mg (600 mg Oral Given 07/10/20 2111)     ED Discharge Orders         Ordered    diazepam (VALIUM) 2 MG tablet  Every 12 hours PRN        07/10/20 2225           Note:  This document was prepared using Dragon voice recognition software and may include unintentional dictation errors.   Duffy Bruce, MD 07/10/20 [id number]

## 2020-07-10 NOTE — Telephone Encounter (Signed)
Spoken with patient, she stated she has a very prominent hx of vertigo and inner ear issues. She has seen ENT numerous times and she is soon going to see neurology. She is currently having very bad dizziness, every time she raises her head the room starts spinning, sometimes she can wait until the episode is done and be back to normal. This time the episode has lasted hours and she is unable to raise and get out of bed. She stated she can not sit up enough to go to a UC,PCP or ED. So I instructed her to call 911 so she can be taken to ED due to her sx. She stated she is not making the decision yet to do anything. She really want these sx to go away on their own. But if that does not work she will go to ED via EMS.

## 2020-07-10 NOTE — ED Notes (Signed)
Pt able to ambulate in hall without assistance with slow, steady gait. Pt reports mild dizziness but still able to ambulate.

## 2020-07-10 NOTE — Telephone Encounter (Signed)
Dizziness , anxiety and feeling like she is going to fall. Unable to sit up or stand she is ok when lying down. Transferred call to Access Nurse. BP 140/82 took all of her medication for BP.

## 2020-07-10 NOTE — ED Triage Notes (Signed)
Pt comes into the ED via [facility] from home c/o dizziness.  Pt has h/o vertigo and states that today feels the same.  Pt states she woke up at 11:00am this morning and started feeling off balance.  Pt in NAD at this time and is alert and oriented x4.  Pt Pt states the dizziness is positional as well. CBG 104, no orthostatic changes 130/72, 70 HR, 99% RA.  Pt is allergic to all vertigo medications in the past.

## 2020-07-11 ENCOUNTER — Other Ambulatory Visit: Payer: Self-pay | Admitting: Family

## 2020-07-12 NOTE — Telephone Encounter (Signed)
Seen in ed 07/10/20

## 2020-07-23 DIAGNOSIS — R42 Dizziness and giddiness: Secondary | ICD-10-CM | POA: Diagnosis not present

## 2020-07-24 DIAGNOSIS — H04123 Dry eye syndrome of bilateral lacrimal glands: Secondary | ICD-10-CM | POA: Diagnosis not present

## 2020-07-24 DIAGNOSIS — H538 Other visual disturbances: Secondary | ICD-10-CM | POA: Diagnosis not present

## 2020-07-30 ENCOUNTER — Encounter (INDEPENDENT_AMBULATORY_CARE_PROVIDER_SITE_OTHER): Payer: Self-pay | Admitting: Physician Assistant

## 2020-07-30 NOTE — Telephone Encounter (Signed)
Please review

## 2020-07-30 NOTE — Telephone Encounter (Signed)
Last OV with Tracey 

## 2020-08-01 ENCOUNTER — Telehealth (INDEPENDENT_AMBULATORY_CARE_PROVIDER_SITE_OTHER): Payer: Medicare HMO | Admitting: Physician Assistant

## 2020-08-08 ENCOUNTER — Other Ambulatory Visit: Payer: Self-pay

## 2020-08-08 ENCOUNTER — Ambulatory Visit (INDEPENDENT_AMBULATORY_CARE_PROVIDER_SITE_OTHER): Payer: Medicare HMO | Admitting: Physician Assistant

## 2020-08-08 ENCOUNTER — Encounter (INDEPENDENT_AMBULATORY_CARE_PROVIDER_SITE_OTHER): Payer: Self-pay | Admitting: Physician Assistant

## 2020-08-08 VITALS — BP 108/72 | HR 62 | Temp 98.0°F | Ht 62.0 in | Wt 179.0 lb

## 2020-08-08 DIAGNOSIS — E7849 Other hyperlipidemia: Secondary | ICD-10-CM

## 2020-08-08 DIAGNOSIS — Z9189 Other specified personal risk factors, not elsewhere classified: Secondary | ICD-10-CM

## 2020-08-08 DIAGNOSIS — Z6836 Body mass index (BMI) 36.0-36.9, adult: Secondary | ICD-10-CM

## 2020-08-08 DIAGNOSIS — E559 Vitamin D deficiency, unspecified: Secondary | ICD-10-CM

## 2020-08-08 DIAGNOSIS — R739 Hyperglycemia, unspecified: Secondary | ICD-10-CM

## 2020-08-09 LAB — LIPID PANEL
Chol/HDL Ratio: 7.1 ratio — ABNORMAL HIGH (ref 0.0–4.4)
Cholesterol, Total: 254 mg/dL — ABNORMAL HIGH (ref 100–199)
HDL: 36 mg/dL — ABNORMAL LOW (ref >39)
LDL Chol Calc (NIH): 187 mg/dL — ABNORMAL HIGH (ref 0–99)
Triglycerides: 165 mg/dL — ABNORMAL HIGH (ref 0–149)
VLDL Cholesterol Cal: 31 mg/dL (ref 5–40)

## 2020-08-09 LAB — VITAMIN D 25 HYDROXY (VIT D DEFICIENCY, FRACTURES): Vit D, 25-Hydroxy: 45.2 ng/mL (ref 30.0–100.0)

## 2020-08-09 LAB — HEMOGLOBIN A1C
Est. average glucose Bld gHb Est-mCnc: 111 mg/dL
Hgb A1c MFr Bld: 5.5 % (ref 4.8–5.6)

## 2020-08-09 LAB — INSULIN, RANDOM: INSULIN: 10.7 u[IU]/mL (ref 2.6–24.9)

## 2020-08-13 NOTE — Progress Notes (Signed)
Chief Complaint:   OBESITY E[place]ka is here to discuss her progress with her obesity treatment plan along with follow-up of her obesity related diagnoses. Debara is on the Category 3 Plan and states she is following her eating plan approximately 80% of the time. Sheera states she is doing 0 minutes 0 times per week.  Today's visit was #: 12 Starting weight: 217 lbs Starting date: 06/01/2019 Today's weight: 179 lbs Today's date: 08/08/2020 Total lbs lost to date: 38 Total lbs lost since last in-office visit: 1  Interim History: Margaretmary recently went to the emergency room for vertigo. She reports that it started 3 days after she started walking for exercise. She feels better. But she states that she moves slowly due to concerns about her balance. She reports that she has not been eating enough.  Subjective:   1. Vitamin D deficiency Illa is on OTC Vit D, and she is tolerating it well.  2. Hyperglycemia Deaisa denies polyphagia.  3. Hyperlipidemia  Ladashia is on red yeast rice, and she sees Dr. Debara Pickett.  Assessment/Plan:   1. Vitamin D deficiency Low Vitamin D level contributes to fatigue and are associated with obesity, breast, and colon cancer. We will check labs today, and [name] agreed to continue taking OTC Vitamin D. She will follow-up for routine testing of Vitamin D, at least 2-3 times per year to avoid over-replacement.  - VITAMIN D 25 Hydroxy (Vit-D Deficiency, Fractures)  2. Hyperglycemia We will check labs today, and results with be discussed with Opal Sidles in 4 weeks at her follow up visit. In the meanwhile Charlottie will continue her meal plan and will work on weight loss efforts.  - Hemoglobin A1c - Insulin, random  3. Hyperlipidemia  Cardiovascular risk and specific lipid/LDL goals reviewed. We discussed several lifestyle modifications today. We will check labs today. Rosangelica will continue to work on diet, exercise and weight loss efforts. Orders and follow up as documented in patient  record.   Counseling Intensive lifestyle modifications are the first line treatment for this issue. . Dietary changes: Increase soluble fiber. Decrease simple carbohydrates. . Exercise changes: Moderate to vigorous-intensity aerobic activity 150 minutes per week if tolerated. . Lipid-lowering medications: see documented in medical record.  - Lipid panel  5. Class 2 severe obesity with serious comorbidity and body mass index (BMI) of 36.0 to 36.9 in adult, unspecified obesity type [facility]) Majel is currently in the action stage of change. As such, her goal is to continue with weight loss efforts. She has agreed to the Category 3 Plan and keeping a food journal and adhering to recommended goals of 450-600 calories and 40 grams of protein at supper daily.   Exercise goals: No exercise has been prescribed at this time.  Behavioral modification strategies: meal planning and cooking strategies and keeping healthy foods in the home.  Kentrell has agreed to follow-up with our clinic in 4 weeks. She was informed of the importance of frequent follow-up visits to maximize her success with intensive lifestyle modifications for her multiple health conditions.   [name] was informed we would discuss her lab results at her next visit unless there is a critical issue that needs to be addressed sooner. Linzy agreed to keep her next visit at the agreed upon time to discuss these results.  Objective:   Blood pressure 108/72, pulse 62, temperature 98 F (36.7 C), temperature source Oral, height 5\' 2"  (1.575 m), weight 179 lb (81.2 kg), SpO2 96 %. Body mass index  is 32.74 kg/m.  General: Cooperative, alert, well developed, in no acute distress. HEENT: Conjunctivae and lids unremarkable. Cardiovascular: Regular rhythm.  Lungs: Normal work of breathing. Neurologic: No focal deficits.   Lab Results  Component Value Date   CREATININE 0.58 07/10/2020   BUN 26 (H) 07/10/2020   NA 139 07/10/2020   K 4.1 07/10/2020    CL 104 07/10/2020   CO2 27 07/10/2020   Lab Results  Component Value Date   ALT 46 (H) 07/10/2020   AST 30 07/10/2020   ALKPHOS 74 07/10/2020   BILITOT 0.8 07/10/2020   Lab Results  Component Value Date   HGBA1C 5.5 08/08/2020   HGBA1C 5.4 05/07/2020   HGBA1C 5.4 11/14/2019   HGBA1C 5.4 06/01/2019   HGBA1C 5.7 05/02/2018   Lab Results  Component Value Date   INSULIN 10.7 08/08/2020   INSULIN 8.2 05/07/2020   INSULIN 8.5 11/14/2019   INSULIN 26.1 (H) 06/01/2019   Lab Results  Component Value Date   TSH 1.810 06/01/2019   Lab Results  Component Value Date   CHOL 254 (H) 08/08/2020   HDL 36 (L) 08/08/2020   LDLCALC 187 (H) 08/08/2020   LDLDIRECT 141.0 05/02/2018   TRIG 165 (H) 08/08/2020   CHOLHDL 7.1 (H) 08/08/2020   Lab Results  Component Value Date   WBC 9.0 07/10/2020   HGB 15.9 (H) 07/10/2020   HCT 46.4 (H) 07/10/2020   MCV 83.9 07/10/2020   PLT 186 07/10/2020   Lab Results  Component Value Date   FERRITIN 111 08/12/2012    Obesity Behavioral Intervention:   Approximately 15 minutes were spent on the discussion below.  ASK: We discussed the diagnosis of obesity with Kaetlin today and Natsha agreed to give [name] permission to discuss obesity behavioral modification therapy today.  ASSESS: Tristy has the diagnosis of obesity and her BMI today is 32.73. Ival is in the action stage of change.   ADVISE: Stuti was educated on the multiple health risks of obesity as well as the benefit of weight loss to improve her health. She was advised of the need for long term treatment and the importance of lifestyle modifications to improve her current health and to decrease her risk of future health problems.  AGREE: Multiple dietary modification options and treatment options were discussed and Shantal agreed to follow the recommendations documented in the above note.  ARRANGE: Kyeshia was educated on the importance of frequent visits to treat obesity as outlined per CMS and  USPSTF guidelines and agreed to schedule her next follow up appointment today.  Attestation Statements:   Reviewed by clinician on day of visit: allergies, medications, problem list, medical history, surgical history, family history, social history, and previous encounter notes.   Wilhemena Durie, am acting as transcriptionist for [employer], PA-C.  I have reviewed the above documentation for accuracy and completeness, and I agree with the above. Abby Potash, PA-C

## 2020-08-16 DIAGNOSIS — R42 Dizziness and giddiness: Secondary | ICD-10-CM | POA: Diagnosis not present

## 2020-08-16 DIAGNOSIS — D329 Benign neoplasm of meninges, unspecified: Secondary | ICD-10-CM | POA: Diagnosis not present

## 2020-08-16 DIAGNOSIS — R4689 Other symptoms and signs involving appearance and behavior: Secondary | ICD-10-CM | POA: Diagnosis not present

## 2020-08-28 DIAGNOSIS — R4689 Other symptoms and signs involving appearance and behavior: Secondary | ICD-10-CM | POA: Diagnosis not present

## 2020-08-28 DIAGNOSIS — R569 Unspecified convulsions: Secondary | ICD-10-CM | POA: Diagnosis not present

## 2020-09-05 ENCOUNTER — Other Ambulatory Visit: Payer: Self-pay

## 2020-09-05 MED ORDER — OMEPRAZOLE 20 MG PO CPDR: 1.0000 | DELAYED_RELEASE_CAPSULE | Freq: Every day | ORAL | 1 refills | Status: DC

## 2020-09-09 ENCOUNTER — Encounter (INDEPENDENT_AMBULATORY_CARE_PROVIDER_SITE_OTHER): Payer: Self-pay | Admitting: Physician Assistant

## 2020-09-09 ENCOUNTER — Ambulatory Visit (INDEPENDENT_AMBULATORY_CARE_PROVIDER_SITE_OTHER): Payer: Medicare HMO | Admitting: Physician Assistant

## 2020-09-09 ENCOUNTER — Other Ambulatory Visit: Payer: Self-pay

## 2020-09-09 VITALS — BP 104/72 | HR 63 | Temp 97.6°F | Ht 62.0 in | Wt 185.0 lb

## 2020-09-09 DIAGNOSIS — E7849 Other hyperlipidemia: Secondary | ICD-10-CM

## 2020-09-09 DIAGNOSIS — Z6836 Body mass index (BMI) 36.0-36.9, adult: Secondary | ICD-10-CM | POA: Diagnosis not present

## 2020-09-10 NOTE — Progress Notes (Signed)
Chief Complaint:   OBESITY Brittney Ruiz is here to discuss her progress with her obesity treatment plan along with follow-up of her obesity related diagnoses. Brittney Ruiz is on the Category 3 Plan and keeping a food journal and adhering to recommended goals of 4550-600 calories and 40 grams protein with supper and states she is following her eating plan approximately 75% of the time. Brittney Ruiz states she is not currently exercising.  Today's visit was #: 31 Starting weight: 217 lbs Starting date: 06/01/2019 Today's weight: 185 lbs Today's date: 09/09/2020 Total lbs lost to date: 32 lbs Total lbs lost since last in-office visit: 0  Interim History: Brittney Ruiz has been stressed with taking care of her chickens. She reports that she is still not eating enough. She has not journaled consistently. She feels that she has gained weight due to eating out more often.  Subjective:   1. Hyperlipidemia  Brittney Ruiz is managed by cardiology. She is on red yeast rice and Omega 3's. She decided to try her husband's Crestor, taking 5 mg three times a week and now she is achy.  Assessment/Plan:   1. Hyperlipidemia  Cardiovascular risk and specific lipid/LDL goals reviewed.  We discussed several lifestyle modifications today and Brittney Ruiz will continue to work on diet, exercise and weight loss efforts. Orders and follow up as documented in patient record. Advised pt to call Dr. Lysbeth Penner office to schedule an appointment for follow up and to stop self-medicating.  Counseling Intensive lifestyle modifications are the first line treatment for this issue. . Dietary changes: Increase soluble fiber. Decrease simple carbohydrates. . Exercise changes: Moderate to vigorous-intensity aerobic activity 150 minutes per week if tolerated. . Lipid-lowering medications: see documented in medical record.  2. Class 2 severe obesity with serious comorbidity and body mass index (BMI) of 36.0 to 36.9 in adult, unspecified obesity type [facility]) Brittney Ruiz is  currently in the action stage of change. As such, her goal is to continue with weight loss efforts. She has agreed to the Category 3 Plan and keeping a food journal and adhering to recommended goals of 450-600 calories and 40 grams protein.   Exercise goals: As is  Behavioral modification strategies: decreasing eating out, meal planning and cooking strategies and keeping healthy foods in the home.  Brittney Ruiz has agreed to follow-up with our clinic in 4 weeks. She was informed of the importance of frequent follow-up visits to maximize her success with intensive lifestyle modifications for her multiple health conditions.   Objective:   Blood pressure 104/72, pulse 63, temperature 97.6 F (36.4 C), height 5\' 2"  (1.575 m), weight 185 lb (83.9 kg), SpO2 98 %. Body mass index is 33.84 kg/m.  General: Cooperative, alert, well developed, in no acute distress. HEENT: Conjunctivae and lids unremarkable. Cardiovascular: Regular rhythm.  Lungs: Normal work of breathing. Neurologic: No focal deficits.   Lab Results  Component Value Date   CREATININE 0.58 07/10/2020   BUN 26 (H) 07/10/2020   NA 139 07/10/2020   K 4.1 07/10/2020   CL 104 07/10/2020   CO2 27 07/10/2020   Lab Results  Component Value Date   ALT 46 (H) 07/10/2020   AST 30 07/10/2020   ALKPHOS 74 07/10/2020   BILITOT 0.8 07/10/2020   Lab Results  Component Value Date   HGBA1C 5.5 08/08/2020   HGBA1C 5.4 05/07/2020   HGBA1C 5.4 11/14/2019   HGBA1C 5.4 06/01/2019   HGBA1C 5.7 05/02/2018   Lab Results  Component Value Date   INSULIN 10.7 08/08/2020  INSULIN 8.2 05/07/2020   INSULIN 8.5 11/14/2019   INSULIN 26.1 (H) 06/01/2019   Lab Results  Component Value Date   TSH 1.810 06/01/2019   Lab Results  Component Value Date   CHOL 254 (H) 08/08/2020   HDL 36 (L) 08/08/2020   LDLCALC 187 (H) 08/08/2020   LDLDIRECT 141.0 05/02/2018   TRIG 165 (H) 08/08/2020   CHOLHDL 7.1 (H) 08/08/2020   Lab Results  Component Value  Date   WBC 9.0 07/10/2020   HGB 15.9 (H) 07/10/2020   HCT 46.4 (H) 07/10/2020   MCV 83.9 07/10/2020   PLT 186 07/10/2020   Lab Results  Component Value Date   FERRITIN 111 08/12/2012    Obesity Behavioral Intervention:   Approximately 15 minutes were spent on the discussion below.  ASK: We discussed the diagnosis of obesity with Analys today and Lafreda agreed to give [name] permission to discuss obesity behavioral modification therapy today.  ASSESS: Anya has the diagnosis of obesity and her BMI today is 33.9. Dechelle is in the action stage of change.   ADVISE: Hevin was educated on the multiple health risks of obesity as well as the benefit of weight loss to improve her health. She was advised of the need for long term treatment and the importance of lifestyle modifications to improve her current health and to decrease her risk of future health problems.  AGREE: Multiple dietary modification options and treatment options were discussed and Chalene agreed to follow the recommendations documented in the above note.  ARRANGE: Samaira was educated on the importance of frequent visits to treat obesity as outlined per CMS and USPSTF guidelines and agreed to schedule her next follow up appointment today.  Attestation Statements:   Reviewed by clinician on day of visit: allergies, medications, problem list, medical history, surgical history, family history, social history, and previous encounter notes.  Coral Ceo, am acting as Location manager for [employer], PA-C.  I have reviewed the above documentation for accuracy and completeness, and I agree with the above. Abby Potash, PA-C

## 2020-09-11 ENCOUNTER — Encounter (INDEPENDENT_AMBULATORY_CARE_PROVIDER_SITE_OTHER): Payer: Self-pay | Admitting: Physician Assistant

## 2020-09-11 ENCOUNTER — Encounter: Payer: Self-pay | Admitting: Family

## 2020-09-11 DIAGNOSIS — R42 Dizziness and giddiness: Secondary | ICD-10-CM | POA: Diagnosis not present

## 2020-09-12 ENCOUNTER — Other Ambulatory Visit: Payer: Self-pay | Admitting: Family

## 2020-09-12 NOTE — Telephone Encounter (Signed)
Please review

## 2020-09-16 DIAGNOSIS — R42 Dizziness and giddiness: Secondary | ICD-10-CM | POA: Diagnosis not present

## 2020-09-16 NOTE — Telephone Encounter (Signed)
For you -

## 2020-09-17 ENCOUNTER — Telehealth (INDEPENDENT_AMBULATORY_CARE_PROVIDER_SITE_OTHER): Payer: Medicare HMO | Admitting: Family

## 2020-09-17 ENCOUNTER — Encounter: Payer: Self-pay | Admitting: Family

## 2020-09-17 VITALS — Ht 62.0 in | Wt 183.0 lb

## 2020-09-17 DIAGNOSIS — D329 Benign neoplasm of meninges, unspecified: Secondary | ICD-10-CM | POA: Diagnosis not present

## 2020-09-17 DIAGNOSIS — R42 Dizziness and giddiness: Secondary | ICD-10-CM

## 2020-09-17 DIAGNOSIS — D320 Benign neoplasm of cerebral meninges: Secondary | ICD-10-CM | POA: Diagnosis not present

## 2020-09-17 DIAGNOSIS — G8929 Other chronic pain: Secondary | ICD-10-CM

## 2020-09-17 DIAGNOSIS — I10 Essential (primary) hypertension: Secondary | ICD-10-CM | POA: Diagnosis not present

## 2020-09-17 DIAGNOSIS — M5442 Lumbago with sciatica, left side: Secondary | ICD-10-CM | POA: Diagnosis not present

## 2020-09-17 DIAGNOSIS — M5441 Lumbago with sciatica, right side: Secondary | ICD-10-CM | POA: Diagnosis not present

## 2020-09-17 DIAGNOSIS — F419 Anxiety disorder, unspecified: Secondary | ICD-10-CM | POA: Diagnosis not present

## 2020-09-17 DIAGNOSIS — E782 Mixed hyperlipidemia: Secondary | ICD-10-CM

## 2020-09-17 NOTE — Assessment & Plan Note (Addendum)
Controlled at baseline. She has seen Dr Richardson Landry and requesting visit notes. She is currently doing vestibular rehab and plans to continue. She has follow up with neuro ophthalmology as well. Will follow.

## 2020-09-17 NOTE — Assessment & Plan Note (Signed)
Well controlled. She is prescribed propranolol from psychiatry for anxiety. Blood pressure controlled. I did make her aware that propranolol 10 mg tid that can cause anxiety.

## 2020-09-17 NOTE — Progress Notes (Signed)
Virtual Visit via Video Note  I connected with@  on 09/18/20 at 11:30 AM EDT by a video enabled telemedicine application and verified that I am speaking with the correct person using two identifiers.  Location patient: home Location provider:work  Persons participating in the virtual visit: patient, provider  I discussed the limitations of evaluation and management by telemedicine and the availability of in person appointments. The patient expressed understanding and agreed to proceed.   HPI:   Recent episode of vertigo. She was seen ED 07/10/20 with subsequent visit with Dr Richardson Landry 07/23/20.   Following with [place] therapist at [facility] for vesitubular therapy, dizziness as referred by neuro- ophthalmology. She needs referral for low back pain, neck pain, and bilateral feet pain. No saddle anesthesia, weakness, trouble urinating, or having bowel movement.  No injury.   She describes low midline pain with numbness radiate to posterior buttocks. Pain of bilateral ventral aspect feet.   Hypercholesteremia- following with Dr Debara Pickett whom she was referred to [facility]. She has been taking red rice yeast extract. Total cholesterol 254, LDL 187 one month ago. She focuses on eating healthier.   GAD- follows with Charolett Bumpers for zoloft, tranxene, propranolol 20 mg tid. Reports anxiety at baseline.   Meningiomas - Consult with neuro- ophthalmology , Dr Donetta Potts reviewed imaging and advised once a year surveillance imaging for meningiomas. Referred for PT for balance and dizziness. Pending EEG and repeat MRI brain in 3 months.   She also saw ophthalmology Dr Danise Mina for blurred vision and treated for dry eye.    05/10/2020 MRI brain with and without contrast Unremarkable exams of the globes and optic pathways. Incidentally noted broad-based left anterior fossa mass most likely representing a meningioma.  CT cervical spine 07/10/20- No evidence of significant degenerative  spondylosis. No canal or foraminal narrowing. Very minimal facet osteoarthritis.    ROS: See pertinent positives and negatives per HPI.    EXAM:  VITALS per patient if applicable: Ht 5\' 2"  (1.575 m)   Wt 183 lb (83 kg)   LMP  (LMP Unknown)   BMI 33.47 kg/m  BP Readings from Last 3 Encounters:  09/09/20 104/72  08/08/20 108/72  07/10/20 (!) 143/87   Wt Readings from Last 3 Encounters:  09/17/20 183 lb (83 kg)  09/09/20 185 lb (83.9 kg)  08/08/20 179 lb (81.2 kg)    GENERAL: alert, oriented, appears well and in no acute distress  HEENT: atraumatic, conjunttiva clear, no obvious abnormalities on inspection of external nose and ears  NECK: normal movements of the head and neck  LUNGS: on inspection no signs of respiratory distress, breathing rate appears normal, no obvious gross SOB, gasping or wheezing  CV: no obvious cyanosis  MS: moves all visible extremities without noticeable abnormality  PSYCH/NEURO: pleasant and cooperative, no obvious depression or anxiety, speech and thought processing grossly intact  ASSESSMENT AND PLAN:  Discussed the following assessment and plan:  Problem List Items Addressed This Visit      Cardiovascular and Mediastinum   HTN (hypertension)    Well controlled. She is prescribed propranolol from psychiatry for anxiety. Blood pressure controlled. I did make her aware that propranolol 10 mg tid that can cause anxiety.         Nervous and Auditory   Meningioma ([place])   Meningioma, cerebral [facility])    Following with  neuro- ophthalmology , Dr Holly Bodily imaging and advised once a year surveillance imaging for meningiomas. Referred for PT for  balance and dizziness. Follow up planned 11/2020        Other   Anxiety    Stable at baseline. Continue  Regimen as prescribed by psychiatry, zoloft, tranxene, propranolol 20 mg tid.       HLD (hyperlipidemia)    Uncontrolled. Total cholesterol 254, LDL 187      Low back pain - Primary     Chronic, uncontrolled. Unable to assess over virtual visit. Advised in person follow up and to let me know of any new or worsening symptoms. Pending DG lumbar. Referral placed as requested to PT. Close follow up.       Relevant Orders   Ambulatory referral to Physical Therapy   DG Lumbar Spine Complete   Vertigo    Controlled at baseline. She has seen Dr Richardson Landry and requesting visit notes. She is currently doing vestibular rehab and plans to continue. She has follow up with neuro ophthalmology as well. Will follow.         -we discussed possible serious and likely etiologies, options for evaluation and workup, limitations of telemedicine visit vs in person visit, treatment, treatment risks and precautions. Pt prefers to treat via telemedicine empirically rather then risking or undertaking an in person visit at this moment.  .   I discussed the assessment and treatment plan with the patient. The patient was provided an opportunity to ask questions and all were answered. The patient agreed with the plan and demonstrated an understanding of the instructions.   The patient was advised to call back or seek an in-person evaluation if the symptoms worsen or if the condition fails to improve as anticipated.   Mable Paris, FNP

## 2020-09-18 ENCOUNTER — Other Ambulatory Visit: Payer: Self-pay

## 2020-09-18 ENCOUNTER — Ambulatory Visit
Admission: RE | Admit: 2020-09-18 | Discharge: 2020-09-18 | Disposition: A | Discharge status: Home / Self Care | Payer: Medicare HMO | Attending: Family | Admitting: Family

## 2020-09-18 ENCOUNTER — Ambulatory Visit
Admission: RE | Admit: 2020-09-18 | Discharge: 2020-09-18 | Disposition: A | Discharge status: Home / Self Care | Payer: Medicare HMO | Source: Ambulatory Visit | Attending: Family | Admitting: Family

## 2020-09-18 DIAGNOSIS — M5442 Lumbago with sciatica, left side: Secondary | ICD-10-CM | POA: Diagnosis not present

## 2020-09-18 DIAGNOSIS — M5441 Lumbago with sciatica, right side: Secondary | ICD-10-CM | POA: Diagnosis not present

## 2020-09-18 DIAGNOSIS — M545 Low back pain, unspecified: Secondary | ICD-10-CM | POA: Diagnosis not present

## 2020-09-18 DIAGNOSIS — G8929 Other chronic pain: Secondary | ICD-10-CM | POA: Insufficient documentation

## 2020-09-18 DIAGNOSIS — D320 Benign neoplasm of cerebral meninges: Secondary | ICD-10-CM | POA: Insufficient documentation

## 2020-09-18 NOTE — Assessment & Plan Note (Addendum)
Uncontrolled. Total cholesterol 254, LDL 187

## 2020-09-18 NOTE — Assessment & Plan Note (Signed)
Following with  neuro- ophthalmology , Dr Holly Bodily imaging and advised once a year surveillance imaging for meningiomas. Referred for PT for balance and dizziness. Follow up planned 11/2020

## 2020-09-18 NOTE — Assessment & Plan Note (Signed)
Stable at baseline. Continue  Regimen as prescribed by psychiatry, zoloft, tranxene, propranolol 20 mg tid.

## 2020-09-18 NOTE — Assessment & Plan Note (Signed)
Chronic, uncontrolled. Unable to assess over virtual visit. Advised in person follow up and to let me know of any new or worsening symptoms. Pending DG lumbar. Referral placed as requested to PT. Close follow up.

## 2020-09-18 NOTE — Progress Notes (Signed)
Due to her seeing Dr. Richardson Landry as a hospital referral I could  not get notes with out med release. Do you want me to have her come sign or sign at her next visit?

## 2020-09-19 ENCOUNTER — Encounter: Payer: Self-pay | Admitting: Family

## 2020-09-19 ENCOUNTER — Other Ambulatory Visit: Payer: Self-pay

## 2020-09-19 NOTE — Progress Notes (Signed)
I have mailed patient form.

## 2020-09-24 ENCOUNTER — Telehealth: Payer: Self-pay

## 2020-09-24 NOTE — Telephone Encounter (Signed)
LMTCB for xray results.  °

## 2020-09-25 ENCOUNTER — Telehealth: Payer: Self-pay

## 2020-09-25 NOTE — Telephone Encounter (Signed)
Pt dropped off ROI for Dr Reola Mosher office. Placed in folder up front

## 2020-09-26 NOTE — Telephone Encounter (Signed)
I have faxed to Dr. Reola Mosher office.

## 2020-09-30 DIAGNOSIS — R42 Dizziness and giddiness: Secondary | ICD-10-CM | POA: Diagnosis not present

## 2020-10-07 ENCOUNTER — Encounter: Payer: Self-pay | Admitting: Adult Health

## 2020-10-07 ENCOUNTER — Telehealth (INDEPENDENT_AMBULATORY_CARE_PROVIDER_SITE_OTHER): Payer: Medicare HMO | Admitting: Adult Health

## 2020-10-07 ENCOUNTER — Other Ambulatory Visit: Payer: Medicare HMO

## 2020-10-07 ENCOUNTER — Ambulatory Visit
Admission: RE | Admit: 2020-10-07 | Discharge: 2020-10-07 | Disposition: A | Discharge status: Home / Self Care | Payer: Medicare HMO | Source: Ambulatory Visit | Attending: Adult Health | Admitting: Adult Health

## 2020-10-07 ENCOUNTER — Other Ambulatory Visit
Admission: RE | Admit: 2020-10-07 | Discharge: 2020-10-07 | Disposition: A | Discharge status: Home / Self Care | Payer: Medicare HMO | Source: Ambulatory Visit | Attending: Adult Health | Admitting: Adult Health

## 2020-10-07 VITALS — Ht 62.01 in | Wt 184.0 lb

## 2020-10-07 DIAGNOSIS — R059 Cough, unspecified: Secondary | ICD-10-CM

## 2020-10-07 DIAGNOSIS — J069 Acute upper respiratory infection, unspecified: Secondary | ICD-10-CM

## 2020-10-07 LAB — CBC WITH DIFFERENTIAL/PLATELET
Abs Immature Granulocytes: 0.02 10*3/uL (ref 0.00–0.07)
Basophils Absolute: 0 10*3/uL (ref 0.0–0.1)
Basophils Relative: 0 %
Eosinophils Absolute: 0.3 10*3/uL (ref 0.0–0.5)
Eosinophils Relative: 4 %
HCT: 45.5 % (ref 36.0–46.0)
Hemoglobin: 15.4 g/dL — ABNORMAL HIGH (ref 12.0–15.0)
Immature Granulocytes: 0 %
Lymphocytes Relative: 31 %
Lymphs Abs: 2.4 10*3/uL (ref 0.7–4.0)
MCH: 29.2 pg (ref 26.0–34.0)
MCHC: 33.8 g/dL (ref 30.0–36.0)
MCV: 86.2 fL (ref 80.0–100.0)
Monocytes Absolute: 0.4 10*3/uL (ref 0.1–1.0)
Monocytes Relative: 6 %
Neutro Abs: 4.4 10*3/uL (ref 1.7–7.7)
Neutrophils Relative %: 59 %
Platelets: 152 10*3/uL (ref 150–400)
RBC: 5.28 MIL/uL — ABNORMAL HIGH (ref 3.87–5.11)
RDW: 13.3 % (ref 11.5–15.5)
WBC: 7.5 10*3/uL (ref 4.0–10.5)
nRBC: 0 % (ref 0.0–0.2)

## 2020-10-07 LAB — COMPREHENSIVE METABOLIC PANEL
ALT: 28 U/L (ref 0–44)
AST: 24 U/L (ref 15–41)
Albumin: 4.5 g/dL (ref 3.5–5.0)
Alkaline Phosphatase: 71 U/L (ref 38–126)
Anion gap: 8 (ref 5–15)
BUN: 30 mg/dL — ABNORMAL HIGH (ref 6–20)
CO2: 28 mmol/L (ref 22–32)
Calcium: 9.3 mg/dL (ref 8.9–10.3)
Chloride: 102 mmol/L (ref 98–111)
Creatinine, Ser: 0.62 mg/dL (ref 0.44–1.00)
GFR, Estimated: >60 mL/min (ref >60)
Glucose, Bld: 88 mg/dL (ref 70–99)
Potassium: 4.2 mmol/L (ref 3.5–5.1)
Sodium: 138 mmol/L (ref 135–145)
Total Bilirubin: 0.8 mg/dL (ref 0.3–1.2)
Total Protein: 7.4 g/dL (ref 6.5–8.1)

## 2020-10-07 NOTE — Patient Instructions (Signed)

## 2020-10-07 NOTE — Addendum Note (Signed)
Addended by: Ezequiel Ganser on: 10/07/2020 02:21 PM   Modules accepted: Orders

## 2020-10-07 NOTE — Progress Notes (Signed)
Virtual Visit via Telephone  Note  I connected with Brittney Ruiz on 10/07/20 at  1:30 PM EDT by a video enabled telemedicine application and verified that I Brittney speaking with the correct person using two identifiers. Parties involved in visit as below:   Location: Patient: at home  Provider: Provider: Provider's office at  [facility], [place].      I discussed the limitations of evaluation and management by telemedicine and the availability of in person appointments. The patient expressed understanding and agreed to proceed.  History of Present Illness: Patient is a 55 year old female who presents for a telephone visit only. No video capabilities on patients end.  She reports she started coughing up mucous on 10/04/20. Onset of 3 days ago. She is taking [facility]  cold and allergy and guaifenesin  PRN.   She thought maybe her nasal allergies were flaring.She reports on Saturday she started having yellow sputum and reports today she is coughing up green/ yellow sputum. Mild headache. Mild nausea. Mostly post nasal drip. Denies any sinus pressure or pain.  At home covid test was negative taken one day after onset 10/05/20. She does have mild wheezing today but reports it is relived by her inhaler with proper use.  She denies any other symptoms prior to 10/04/20 and no prior recent illness.  Prior smoker.  She does report a history of bronchitis in past.   Patient  denies any fever, body aches,chills, rash, chest pain, shortness of breath, nausea, vomiting, or diarrhea.    She is vaccinated for covid, and for influenza. She has not had covid booster.   Denies any known exposures reports she did just start going back to church. 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    Observations/Objective:   Patient is alert and oriented and responsive to questions Engages in conversation with provider. Speaks in  full sentences without any pauses without any shortness of breath or distress.  Assessment and Plan:  1. Cough Continue Tussin as it seems to be helping, she denies need for refill inhaler and does not take steroids.  Discussed symptomatic treatment and management of symptoms.  - CBC with Differential/Platelet - DG Chest 2 View - Comprehensive metabolic panel [facility].    2. Viral upper respiratory tract infection Office testing her for Covid, influenza , RSV and strep this afternoon in parking lot. Discussed with her since her friend is sick as well, that this is likely viral and she could have very well tested to early.    Orders Placed This Encounter  Procedures  . COVID-19, Flu A+B and RSV  . Culture, Group A Strep  . DG Chest 2 View  . CBC with Differential/Platelet  . Comprehensive metabolic panel    She is aware that CBC, CMP and [facility]  Follow Up Instructions:   Advised in person evaluation at anytime is advised if any symptoms do not improve, worsen or change at any given time.    Red Flags discussed. The patient was given clear instructions to go to ER or return to medical center if any red flags develop, symptoms do not improve, worsen or new problems develop. They verbalized understanding.   Return in about 3 days (around 10/10/2020), or if symptoms worsen or fail to improve, for at any time for any worsening symptoms,  Go to Emergency room/ urgent care if worse. I discussed the assessment and treatment plan with the patient. The patient was provided an opportunity to ask questions and all were answered. The patient agreed with the plan and demonstrated an understanding of the instructions.   The patient was advised to call back or seek an in-person evaluation if the symptoms worsen or if the condition fails to improve as anticipated.  I provided 20  minutes of non-face-to-face time during this  encounter.   Brittney Buffy, FNP

## 2020-10-08 ENCOUNTER — Telehealth (INDEPENDENT_AMBULATORY_CARE_PROVIDER_SITE_OTHER): Payer: Medicare HMO | Admitting: Physician Assistant

## 2020-10-08 DIAGNOSIS — E559 Vitamin D deficiency, unspecified: Secondary | ICD-10-CM

## 2020-10-08 DIAGNOSIS — Z6836 Body mass index (BMI) 36.0-36.9, adult: Secondary | ICD-10-CM

## 2020-10-08 LAB — COVID-19, FLU A+B AND RSV
Influenza A, NAA: NOT DETECTED
Influenza B, NAA: NOT DETECTED
RSV, NAA: NOT DETECTED
SARS-CoV-2, NAA: NOT DETECTED

## 2020-10-08 NOTE — Progress Notes (Signed)
BUN is chronically elevated. Slightly more elevated than 3 months ago, any dark stools ? Blood in stools ?  CBC is stable.

## 2020-10-08 NOTE — Progress Notes (Signed)
Chest x ray is within normal limits. Suspect viral illness given short duration. Recommend follow up if symptoms persist or worsen at anytime or not responding to over the counter symptom management as discussed.  In person evaluation advised if symptoms worsening.

## 2020-10-08 NOTE — Progress Notes (Signed)
Covid, influenza and RSV are negative. How is she feeling ?

## 2020-10-08 NOTE — Progress Notes (Signed)
TeleHealth Visit:  Due to the COVID-19 pandemic, this visit was completed with telemedicine (audio/video) technology to reduce patient and provider exposure as well as to preserve personal protective equipment.   [name] has verbally consented to this TeleHealth visit. The patient is located at home, the provider is located at the [facility] office. The participants in this visit include the listed provider and patient. The visit was conducted today via MyChart video.   Chief Complaint: OBESITY Brittney Ruiz is here to discuss her progress with her obesity treatment plan along with follow-up of her obesity related diagnoses. Brittney Ruiz is on the Category 3 Plan and keeping a food journal and adhering to recommended goals of 450-600 calories and 40 grams of protein at supper daily and states she is following her eating plan approximately 90% of the time. Brittney Ruiz states she is doing physical therapy.   Today's visit was #: 19 Starting weight: 217 lbs Starting date: 06/01/2019  Interim History: Brittney Ruiz is sick today and she is at home. Her primary care provider sent her for blood work and CXR yesterday, which was negative. She continues with cough and sore throat. She has been making our recipes and she is enjoying them.  Subjective:   1. Vitamin D deficiency Brittney Ruiz is on Vit D 1,000 units daily, and she is tolerating it well.  Assessment/Plan:   1. Vitamin D deficiency Low Vitamin D level contributes to fatigue and are associated with obesity, breast, and colon cancer. Brittney Ruiz agreed to continue taking Vitamin D 1,000 IU daily and will follow-up for routine testing of Vitamin D, at least 2-3 times per year to avoid over-replacement.  2. Class 2 severe obesity with serious comorbidity and body mass index (BMI) of 36.0 to 36.9 in adult, unspecified obesity type [facility]) Brittney Ruiz is currently in the action stage of change. As such, her goal is to continue with weight loss efforts. She has agreed to keeping a  food journal and adhering to recommended goals of 1300-1500 calories and 95 grams of protein daily.   Exercise goals: No exercise has been prescribed at this time.  Behavioral modification strategies: increasing lean protein intake and meal planning and cooking strategies.  Brittney Ruiz has agreed to follow-up with our clinic in 4 to 5 weeks. She was informed of the importance of frequent follow-up visits to maximize her success with intensive lifestyle modifications for her multiple health conditions.  Objective:   VITALS: Per patient if applicable, see vitals. GENERAL: Alert and in no acute distress. CARDIOPULMONARY: No increased WOB. Speaking in clear sentences.  PSYCH: Pleasant and cooperative. Speech normal rate and rhythm. Affect is appropriate. Insight and judgement are appropriate. Attention is focused, linear, and appropriate.  NEURO: Oriented as arrived to appointment on time with no prompting.   Lab Results  Component Value Date   CREATININE 0.62 10/07/2020   BUN 30 (H) 10/07/2020   NA 138 10/07/2020   K 4.2 10/07/2020   CL 102 10/07/2020   CO2 28 10/07/2020   Lab Results  Component Value Date   ALT 28 10/07/2020   AST 24 10/07/2020   ALKPHOS 71 10/07/2020   BILITOT 0.8 10/07/2020   Lab Results  Component Value Date   HGBA1C 5.5 08/08/2020   HGBA1C 5.4 05/07/2020   HGBA1C 5.4 11/14/2019   HGBA1C 5.4 06/01/2019   HGBA1C 5.7 05/02/2018   Lab Results  Component Value Date   INSULIN 10.7 08/08/2020   INSULIN 8.2 05/07/2020   INSULIN 8.5 11/14/2019  INSULIN 26.1 (H) 06/01/2019   Lab Results  Component Value Date   TSH 1.810 06/01/2019   Lab Results  Component Value Date   CHOL 254 (H) 08/08/2020   HDL 36 (L) 08/08/2020   LDLCALC 187 (H) 08/08/2020   LDLDIRECT 141.0 05/02/2018   TRIG 165 (H) 08/08/2020   CHOLHDL 7.1 (H) 08/08/2020   Lab Results  Component Value Date   WBC 7.5 10/07/2020   HGB 15.4 (H) 10/07/2020   HCT 45.5 10/07/2020   MCV 86.2  10/07/2020   PLT 152 10/07/2020   Lab Results  Component Value Date   FERRITIN 111 08/12/2012    Attestation Statements:   Reviewed by clinician on day of visit: allergies, medications, problem list, medical history, surgical history, family history, social history, and previous encounter notes.   Wilhemena Durie, am acting as transcriptionist for [employer], PA-C.  I have reviewed the above documentation for accuracy and completeness, and I agree with the above. - *Abby Potash, PA-C

## 2020-10-09 ENCOUNTER — Other Ambulatory Visit: Payer: Self-pay

## 2020-10-09 ENCOUNTER — Telehealth: Payer: Self-pay

## 2020-10-09 ENCOUNTER — Other Ambulatory Visit: Payer: Self-pay | Admitting: Adult Health

## 2020-10-09 DIAGNOSIS — J019 Acute sinusitis, unspecified: Secondary | ICD-10-CM

## 2020-10-09 DIAGNOSIS — G8929 Other chronic pain: Secondary | ICD-10-CM

## 2020-10-09 DIAGNOSIS — M545 Low back pain, unspecified: Secondary | ICD-10-CM

## 2020-10-09 MED ORDER — AMOXICILLIN-POT CLAVULANATE 875-125 MG PO TABS: 1.0000 | ORAL_TABLET | Freq: Two times a day (BID) | ORAL | 0 refills | Status: AC

## 2020-10-09 NOTE — Progress Notes (Signed)
Meds ordered this encounter  Medications  . amoxicillin-clavulanate (AUGMENTIN) 875-125 MG tablet    Sig: Take 1 tablet by mouth 2 (two) times daily for 10 days.    Dispense:  20 tablet    Refill:  0

## 2020-10-09 NOTE — Progress Notes (Signed)
Meds ordered this encounter Medications  amoxicillin-clavulanate (AUGMENTIN) 875-125 MG tablet   Sig: Take 1 tablet by mouth 2 (two) times daily for 10 days.   Dispense:  20 tablet   Refill:  0  Sent antibiotic to pharmacy. If not improving or resolved recommend being seen in person at Urgent Care if needed at anytime. Would recommend continuing symptomatic treatment as well.

## 2020-10-09 NOTE — Telephone Encounter (Signed)
   Brittney Ruiz,   referral had been placed 09/17/20. Looks like she already has appt with Misty scheduled.  Does she need something else in regards to referral?   Please arrange f/u appt IN person to f/u back pain in the next 8-12 weeks, sooner if patient feels needed

## 2020-10-09 NOTE — Telephone Encounter (Signed)
Patient stated she wants her referral placed at the [facility] physical therapy in [facility] with [place], PT.

## 2020-10-09 NOTE — Addendum Note (Signed)
Addended by: Burnard Hawthorne on: 10/09/2020 10:11 AM   Modules accepted: Orders

## 2020-10-09 NOTE — Telephone Encounter (Signed)
Patient was actually called and xrays that were done back 5/4. She does want to be referred to Dr. Gale Journey. Patient knows to let us know if she does not hear in regards to appointment within one week.

## 2020-10-09 NOTE — Telephone Encounter (Signed)
Ref to dr Sharlet Salina placed

## 2020-10-10 DIAGNOSIS — F410 Panic disorder [episodic paroxysmal anxiety] without agoraphobia: Secondary | ICD-10-CM | POA: Diagnosis not present

## 2020-10-10 DIAGNOSIS — F4001 Agoraphobia with panic disorder: Secondary | ICD-10-CM | POA: Diagnosis not present

## 2020-10-10 DIAGNOSIS — F39 Unspecified mood [affective] disorder: Secondary | ICD-10-CM | POA: Diagnosis not present

## 2020-10-10 LAB — CULTURE, GROUP A STREP: Strep A Culture: NEGATIVE

## 2020-10-10 NOTE — Progress Notes (Signed)
Negative strep test.

## 2020-10-18 ENCOUNTER — Telehealth (INDEPENDENT_AMBULATORY_CARE_PROVIDER_SITE_OTHER): Payer: Medicare HMO | Admitting: Internal Medicine

## 2020-10-18 ENCOUNTER — Telehealth: Payer: Self-pay

## 2020-10-18 ENCOUNTER — Encounter: Payer: Self-pay | Admitting: Internal Medicine

## 2020-10-18 VITALS — BP 120/78 | HR 68 | Wt 179.0 lb

## 2020-10-18 DIAGNOSIS — M791 Myalgia, unspecified site: Secondary | ICD-10-CM | POA: Diagnosis not present

## 2020-10-18 DIAGNOSIS — Z6841 Body Mass Index (BMI) 40.0 and over, adult: Secondary | ICD-10-CM

## 2020-10-18 DIAGNOSIS — Z8249 Family history of ischemic heart disease and other diseases of the circulatory system: Secondary | ICD-10-CM

## 2020-10-18 DIAGNOSIS — T466X5D Adverse effect of antihyperlipidemic and antiarteriosclerotic drugs, subsequent encounter: Secondary | ICD-10-CM | POA: Diagnosis not present

## 2020-10-18 DIAGNOSIS — E782 Mixed hyperlipidemia: Secondary | ICD-10-CM | POA: Diagnosis not present

## 2020-10-18 DIAGNOSIS — T466X5A Adverse effect of antihyperlipidemic and antiarteriosclerotic drugs, initial encounter: Secondary | ICD-10-CM

## 2020-10-18 MED ORDER — REPATHA SURECLICK 140 MG/ML ~~LOC~~ SOAJ: 1.0000 | SUBCUTANEOUS | 11 refills | Status: DC

## 2020-10-18 NOTE — Telephone Encounter (Signed)
Pt called and needs some clarification on the repatha sure click that was prescribed to her from cardiology. She states that she wants to know if she can get it done through [name]. Please advise

## 2020-10-18 NOTE — Progress Notes (Signed)
Virtual Visit via Video Note   This visit type was conducted due to national recommendations for restrictions regarding the COVID-19 Pandemic (e.g. social distancing) in an effort to limit this patient's exposure and mitigate transmission in our community.  Due to her co-morbid illnesses, this patient is at least at moderate risk for complications without adequate follow up.  This format is felt to be most appropriate for this patient at this time.  All issues noted in this document were discussed and addressed.  A limited physical exam was performed with this format.  Please refer to the patient's chart for her consent to telehealth for [facility].      Date:  10/18/2020   ID:  Brittney Ruiz, DOB 28-Mar-1966, MRN [id number] The patient was identified using 2 identifiers.  Evaluation Performed:  Follow-Up Visit  Patient Location:  [place] [address]  Provider location:   [address] [place], [place] [address]  PCP:  Burnard Hawthorne, FNP  Cardiologist:  None Electrophysiologist:  None   Chief Complaint:  Dyslipidemia  History of Present Illness:    Brittney Ruiz is a 55 y.o. female who presents via audio/video conferencing for a telehealth visit today.  This is a 55 year old female kindly referred for evaluation management of dyslipidemia.  She reports this is a longstanding issue which is basically related to weight.  She has had issues with anxiety and depression and was on antidepressant medications and gained over 100 pounds.  She is been working to lose out and is managed to lose about 50 pounds.  She has a family history of heart disease she says in her father and grandfather and has COPD and other cardiovascular risk factors.  She has been seen by my partner Dr. Fletcher Anon about released from cardiology due to some mitral valve disease which apparently improved.  She did have a CT coronary angiogram in 2020 which showed no evidence of coronary artery  disease and 0 coronary calcium.  I share that information with her again today and the importance of the very high negative predictive value of a study like that.  Overall I find that reassuring despite the fact that her cholesterol has been elevated.  In general the lipids have been slowly coming down, but she cannot tolerate any medications.  She cannot take any statins due to side effects.  She cannot take ezetimibe on a daily basis and currently only takes a quarter of a tablet 1 or 2 times a week.  She has over 41 different allergies to medications, foods and other products.  She reports being fairly sedentary due to issues with plantar fasciitis.  She continues to work with the weight loss clinic regarding dietary approaches to weight loss.  Her most recent lipid profile showed total cholesterol 217, triglycerides 148, HDL 30 and LDL of 160.  10/18/2020  Brittney Ruiz returns today for video visit.  Although she has been making great strides in her diet and has lost 50 pounds with significant improvement in her food choices, her cholesterol still remains elevated.  She was initially trying to take a quarter of the 20 mg tablets her husband was taking of rosuvastatin.  She says she slowly increase that dose from 5 mg a day up to 20 mg a day, 1 full tablet over a period of 4 weeks.  Ultimately then she developed significant side effects.  She stopped it for 2 weeks and then tried to go back to  red yeast rice which also caused her side effects including significant myalgias.  Cholesterol remains very high in fact up close to 190 and there is a very strong family history of multiple family members that have high cholesterol on statins.  This could be a familial hyperlipidemia, especially without the significant change in cholesterol numbers.  The patient does not have symptoms concerning for COVID-19 infection (fever, chills, cough, or new SHORTNESS OF BREATH).    Prior CV studies:   The following studies were  reviewed today:  Chart reviewed  PMHx:  Past Medical History:  Diagnosis Date  . Abnormal Pap smear of cervix    h/o LEEP  . Allergy   . Angio-edema   . Anxiety   . Asthma   . Bladder leak   . Cancer ([place])    melanoma 2003  . Chest pain   . Constipation   . COPD (chronic obstructive pulmonary disease) ([place])   . Depression   . Diarrhea   . Food allergy   . Heart valve problem   . Heartburn   . History of swelling of feet   . Hyperlipidemia   . Hypertension   . IBS (irritable bowel syndrome)   . Joint pain   . Low back pain   . Melanoma ([place])    right foot s/p lymph node removed right groin  . Obesity (BMI 30-39.9)   . Palpitation   . Prediabetes   . Shortness of breath   . Urticaria   . Vaginal delivery    X 2    Past Surgical History:  Procedure Laterality Date  . BLADDER SUSPENSION  07/2017  . COLONOSCOPY WITH PROPOFOL N/A 07/01/2017   Procedure: COLONOSCOPY WITH PROPOFOL;  Surgeon: Virgel Manifold, MD;  Location: [facility] ENDOSCOPY;  Service: Endoscopy;  Laterality: N/A;  . ESOPHAGOGASTRODUODENOSCOPY (EGD) WITH PROPOFOL N/A 07/01/2017   Procedure: ESOPHAGOGASTRODUODENOSCOPY (EGD) WITH PROPOFOL;  Surgeon: Virgel Manifold, MD;  Location: [facility] ENDOSCOPY;  Service: Endoscopy;  Laterality: N/A;  . LEEP  1995  . LEEP    . PARTIAL HYSTERECTOMY  08/2017   s/p partial hysterectomy - per patient HAS cervix, uterus, fallopian tubes; ovaries INTACT. NO CERVIX on exam 03/06/20  . SKIN CANCER EXCISION     melanoma  . TONSILLECTOMY      FAMHx:  Family History  Problem Relation Age of Onset  . Hyperlipidemia Mother   . Hypertension Mother   . Diabetes Mother   . Breast cancer Mother 31  . Stroke Mother   . Cancer Mother   . Cancer Father        lung  . Depression Father   . Diabetes Father   . Hypertension Father   . Hyperlipidemia Father   . Sleep apnea Father   . Alcoholism Father   . Hyperlipidemia Sister   . Hypertension Sister   . Migraines  Sister   . Hypertension Brother   . Hyperlipidemia Brother   . Migraines Brother   . Cancer Maternal Grandmother        glioblastoma  . Depression Maternal Grandfather   . Heart disease Maternal Grandfather   . Cancer Paternal Grandfather        unknown  . AAA (abdominal aortic aneurysm) Paternal Grandfather     SOCHx:   reports that she quit smoking about 14 years ago. Her smoking use included cigarettes. She has a 25.00 pack-year smoking history. She has never used smokeless tobacco. She reports that she does not drink  alcohol and does not use drugs.  ALLERGIES:  Allergies  Allergen Reactions  . Elemental Sulfur Swelling, Hives and Other (See Comments)  . Pork Allergy Swelling    Other reaction(s): OTHER  . Pork-Derived Products Swelling    Other reaction(s): OTHER Other reaction(s): OTHER Other reaction(s): OTHER   . Aciphex [Rabeprazole Sodium]   . Antivert [Meclizine Hcl]     Causes dizziness and hives  . Buspar [Buspirone Hcl]     hives  . Cat Hair Extract     Other reaction(s): SHORTNESS OF BREATH  . Chlorpheniramine-Pseudoeph   . Chlorpheniramine-Pseudoeph Other (See Comments)    Messes with her anxiety  . Chocolate Flavor   . Codeine   . Crestor [Rosuvastatin Calcium]     Could not tolerate   . Cymbalta [Duloxetine Hcl]   . Duloxetine Other (See Comments)    Other reaction(s): HIVES Other reaction(s): HIVES   . Effexor Xr [Venlafaxine Hydrochloride]     hives  . Epinephrine   . Erythromycin     Throwing up  . Escitalopram Oxalate   . Lamictal [Lamotrigine]     hives  . Levofloxacin Swelling    Levofloxacin  . Lipitor [Atorvastatin]     Multiple symptoms  . Meclizine Other (See Comments)  . Mometasone Furoate   . Mometasone Furoate   . Monosodium Glutamate     Other reaction(s): OTHER Other reaction(s): OTHER   . Neurontin [Gabapentin]     Hives   . Other     Other reaction(s): HIVES Other reaction(s): HIVES Other reaction(s): HIVES Other  reaction(s): HIVES   . Prednisone   . Red Yeast Rice Extract     aches  . Shellfish Allergy     Other reaction(s): HIVES Other reaction(s): HIVES  . Sudafed [Pseudoephedrine Hcl]     Causes severe anxiety  . Sulfa Antibiotics Other (See Comments)    Other reaction(s): HIVES Other reaction(s): HIVES   . Sulfasalazine Other (See Comments)  . Valium   . Wellbutrin [Bupropion Hcl]   . Wixela Inhub [Fluticasone-Salmeterol]     Increased heart rate  . Xanax Xr [Alprazolam]     Hives and anxiety  . Zetia [Ezetimibe]     body aches on full dose  . Alfalfa Rash  . Brompheniramine-Phenylephrine Palpitations    hypersensitive    . Buspirone Rash and Other (See Comments)  . Tape Rash    MEDS:  Current Meds  Medication Sig  . Evolocumab (REPATHA SURECLICK) 563 MG/ML SOAJ Inject 1 Dose into the skin every 14 (fourteen) days.     ROS: Pertinent items noted in HPI and remainder of comprehensive ROS otherwise negative.  Labs/Other Tests and Data Reviewed:    Recent Labs: 10/07/2020: ALT 28; BUN 30; Creatinine, Ser 0.62; Hemoglobin 15.4; Platelets 152; Potassium 4.2; Sodium 138   Recent Lipid Panel Lab Results  Component Value Date/Time   CHOL 254 (H) 08/08/2020 03:06 PM   TRIG 165 (H) 08/08/2020 03:06 PM   HDL 36 (L) 08/08/2020 03:06 PM   CHOLHDL 7.1 (H) 08/08/2020 03:06 PM   CHOLHDL 8 05/02/2018 02:57 PM   LDLCALC 187 (H) 08/08/2020 03:06 PM   LDLDIRECT 141.0 05/02/2018 02:57 PM    Wt Readings from Last 3 Encounters:  10/18/20 179 lb (81.2 kg)  10/07/20 184 lb (83.5 kg)  09/17/20 183 lb (83 kg)     Exam:    Vital Signs:  BP 120/78   Pulse 68   Wt 179 lb (81.2 kg)  LMP  (LMP Unknown)   BMI 32.73 kg/m    General appearance: alert, no distress and mildly obese Lungs: No audible wheezing Abdomen: Mildly overweight Extremities: extremities normal, atraumatic, no cyanosis or edema Neurologic: Grossly normal  ASSESSMENT & PLAN:    1. Mixed dyslipidemia 2. No  CAD with a 0 coronary calcium score (2020) 3. Family history of heart disease in her father and grandfather 107. Obesity with weight loss efforts in place 5. Statin and ezetimibe intolerance  Ms. Mcswain has a significant mixed dyslipidemia with LDL cholesterol near 190, possibly genetic.  There is a family history of heart disease including high cholesterol.  Fortunately she had no coronary calcium in 2020 however her cholesterol has not come down with significant weight loss of 50 pounds in major dietary changes.  She is not eating a ketogenic diet.  Her options for treatment therapy are limited.  She cannot take it red yeast rice or statins or Zetia due to side effects.  She has numerous medication intolerances.  I think she would be a good candidate for PCSK9 inhibitor and would recommend pursuing that.  COVID-19 Education: The signs and symptoms of COVID-19 were discussed with the patient and how to seek care for testing (follow up with PCP or arrange E-visit).  The importance of social distancing was discussed today.  Patient Risk:   After full review of this patients clinical status, I feel that they are at least moderate risk at this time.  Time:   Today, I have spent 25 minutes with the patient with telehealth technology discussing dyslipidemia, dietary approaches to cholesterol management, over-the-counter recommendations.     Medication Adjustments/Labs and Tests Ordered: Current medicines are reviewed at length with the patient today.  Concerns regarding medicines are outlined above.   Tests Ordered: Orders Placed This Encounter  Procedures  . Lipid panel    Medication Changes: Meds ordered this encounter  Medications  . Evolocumab (REPATHA SURECLICK) 007 MG/ML SOAJ    Sig: Inject 1 Dose into the skin every 14 (fourteen) days.    Dispense:  2 mL    Refill:  11    Disposition:  prn  [name] Casino, MD, [facility], [place] Director of the  Advanced Lipid Disorders &  Cardiovascular Risk Reduction Clinic Diplomate of the American Board of Clinical Lipidology Attending Cardiologist  Direct Dial: [phone]  Fax: [phone]  Website:  [email or web address]  [name] Casino, MD  10/18/2020 10:05 AM

## 2020-10-18 NOTE — Patient Instructions (Signed)
Medication Instructions:  Dr. Debara Pickett recommends Repatha 140mg /mL (PCSK9). This is an injectable cholesterol medication self-administered once every 14 days. This medication will likely need prior approval with your insurance company, which we will work on. If the medication is not approved initially, we may need to do an appeal with your insurance.   Administer medication in area of fatty tissue such as abdomen, outer thigh, back of upper arm - and rotate site with each injection Store medication in refrigerator until ready to administer - allow to sit at room temp for 30 mins - 1 hour prior to injection Dispose of medication in a SHARPS container - your pharmacy should be able to direct you on this and proper disposal   If you need a co-pay card for Repatha: http://[email or web address]-[email or web address] >> paying for Repatha or red box that says "[place]" in top right If you need a co-pay card for Praluent: WedMap.it >> starting & paying for Praluent  Patient Assistance:   [employer] has grant funds for co-pay assistance. The funds open and close at various times. You can check the website to see if the grant has opened at various times. If open, instructions to apply are below: 1. Go to healthwellfoundation.org 2. Click on "Apply Now" 3. Answer questions as to whom is applying (patient or representative) 4. Your disease fund will be "hypercholesterolemia - Medicare access" 5. They will ask questions about finances and which medications you are taking for cholesterol 6. When you submit, the approval is usually within minutes.  You will need to print the card information from the site 7. You will need to show this information to your pharmacy, they will bill your Medicare Part D plan first -then bill Health Well --for the copay.   You can also call them at [phone], although the hold times can be quite long.   [place] open and close at various times - you can sign up for  a wait list to be notified [email or web address]  *If you need a refill on your cardiac medications before your next appointment, please call your pharmacy*   Lab Work: FASTING lab work in 3-4 months to check cholesterol  -- complete about 1 week before your next visit with Dr. Debara Pickett   If you have labs (blood work) drawn today and your tests are completely normal, you will receive your results only by: Marland Kitchen MyChart Message (if you have MyChart) OR . A paper copy in the mail If you have any lab test that is abnormal or we need to change your treatment, we will call you to review the results.   Testing/Procedures: NONE   Follow-Up: At [facility], you and your health needs are our priority.  As part of our continuing mission to provide you with exceptional heart care, we have created designated Provider Care Teams.  These Care Teams include your primary Cardiologist (physician) and Advanced Practice Providers (APPs -  Physician Assistants and Nurse Practitioners) who all work together to provide you with the care you need, when you need it.  We recommend signing up for the patient portal called "MyChart".  Sign up information is provided on this After Visit Summary.  MyChart is used to connect with patients for Virtual Visits (Telemedicine).  Patients are able to view lab/test results, encounter notes, upcoming appointments, etc.  Non-urgent messages can be sent to your provider as well.   To learn more about what you can do with MyChart, go to  [email or web address].    Your next appointment:   3-4 month(s) - lipid clinic  The format for your next appointment:   In Person or Virtual  Provider:   K. Mali Hilty, MD   Other Instructions

## 2020-10-21 DIAGNOSIS — D329 Benign neoplasm of meninges, unspecified: Secondary | ICD-10-CM | POA: Diagnosis not present

## 2020-10-21 NOTE — Telephone Encounter (Signed)
LMTCB

## 2020-10-23 DIAGNOSIS — G8929 Other chronic pain: Secondary | ICD-10-CM | POA: Diagnosis not present

## 2020-10-23 DIAGNOSIS — M545 Low back pain, unspecified: Secondary | ICD-10-CM | POA: Diagnosis not present

## 2020-10-24 ENCOUNTER — Telehealth: Payer: Self-pay | Admitting: Internal Medicine

## 2020-10-24 NOTE — Telephone Encounter (Signed)
PA for repatha sureclick submitted via CMM (Key: [id number])

## 2020-10-28 NOTE — Telephone Encounter (Signed)
Called to check on status  Outcome should be available in 24 hours Ref: [id number]  Phone: [phone]

## 2020-10-29 DIAGNOSIS — M79671 Pain in right foot: Secondary | ICD-10-CM | POA: Diagnosis not present

## 2020-10-29 DIAGNOSIS — M545 Low back pain, unspecified: Secondary | ICD-10-CM | POA: Diagnosis not present

## 2020-10-29 DIAGNOSIS — M79672 Pain in left foot: Secondary | ICD-10-CM | POA: Diagnosis not present

## 2020-10-29 DIAGNOSIS — G8929 Other chronic pain: Secondary | ICD-10-CM | POA: Diagnosis not present

## 2020-10-30 NOTE — Telephone Encounter (Signed)
Patient states the repatha was approved and then denied. She states she is not sure if the prescription was written wrong or if it was sent wrong. She states it is supposedly written for 140 mg/ml but the pharmacy reads it as trying to fill for 2 ml for 2 days. She states it was denied due to the quantity limitation coverage policy.

## 2020-10-30 NOTE — Telephone Encounter (Signed)
Spoke with [employer] rep. Medication is approved 05/17/2021

## 2020-10-30 NOTE — Telephone Encounter (Signed)
Spoke with patient. Explained that medication is approved. She should contact pharmacy for fill. She would like to try medication and if tolerated, get Rx in future from [place]. Advised to call about this when necessary

## 2020-11-05 DIAGNOSIS — M79672 Pain in left foot: Secondary | ICD-10-CM | POA: Diagnosis not present

## 2020-11-05 DIAGNOSIS — G8929 Other chronic pain: Secondary | ICD-10-CM | POA: Diagnosis not present

## 2020-11-05 DIAGNOSIS — M545 Low back pain, unspecified: Secondary | ICD-10-CM | POA: Diagnosis not present

## 2020-11-05 DIAGNOSIS — M79671 Pain in right foot: Secondary | ICD-10-CM | POA: Diagnosis not present

## 2020-11-11 DIAGNOSIS — M5416 Radiculopathy, lumbar region: Secondary | ICD-10-CM | POA: Diagnosis not present

## 2020-11-11 DIAGNOSIS — M5136 Other intervertebral disc degeneration, lumbar region: Secondary | ICD-10-CM | POA: Diagnosis not present

## 2020-11-11 DIAGNOSIS — M4726 Other spondylosis with radiculopathy, lumbar region: Secondary | ICD-10-CM | POA: Diagnosis not present

## 2020-11-12 DIAGNOSIS — D329 Benign neoplasm of meninges, unspecified: Secondary | ICD-10-CM | POA: Diagnosis not present

## 2020-11-13 DIAGNOSIS — M79672 Pain in left foot: Secondary | ICD-10-CM | POA: Diagnosis not present

## 2020-11-13 DIAGNOSIS — M79671 Pain in right foot: Secondary | ICD-10-CM | POA: Diagnosis not present

## 2020-11-13 DIAGNOSIS — G8929 Other chronic pain: Secondary | ICD-10-CM | POA: Diagnosis not present

## 2020-11-13 DIAGNOSIS — M545 Low back pain, unspecified: Secondary | ICD-10-CM | POA: Diagnosis not present

## 2020-11-14 ENCOUNTER — Encounter (INDEPENDENT_AMBULATORY_CARE_PROVIDER_SITE_OTHER): Payer: Self-pay

## 2020-11-14 ENCOUNTER — Ambulatory Visit (INDEPENDENT_AMBULATORY_CARE_PROVIDER_SITE_OTHER): Payer: Medicare HMO | Admitting: Physician Assistant

## 2020-11-14 ENCOUNTER — Encounter (INDEPENDENT_AMBULATORY_CARE_PROVIDER_SITE_OTHER): Payer: Self-pay | Admitting: Physician Assistant

## 2020-11-14 NOTE — Telephone Encounter (Signed)
Last seen Brittney Ruiz 

## 2020-11-19 ENCOUNTER — Encounter (INDEPENDENT_AMBULATORY_CARE_PROVIDER_SITE_OTHER): Payer: Self-pay | Admitting: Physician Assistant

## 2020-11-19 DIAGNOSIS — M79671 Pain in right foot: Secondary | ICD-10-CM | POA: Diagnosis not present

## 2020-11-19 DIAGNOSIS — M79672 Pain in left foot: Secondary | ICD-10-CM | POA: Diagnosis not present

## 2020-11-26 DIAGNOSIS — M79672 Pain in left foot: Secondary | ICD-10-CM | POA: Diagnosis not present

## 2020-11-26 DIAGNOSIS — M79671 Pain in right foot: Secondary | ICD-10-CM | POA: Diagnosis not present

## 2020-12-03 DIAGNOSIS — M79671 Pain in right foot: Secondary | ICD-10-CM | POA: Diagnosis not present

## 2020-12-03 DIAGNOSIS — M79672 Pain in left foot: Secondary | ICD-10-CM | POA: Diagnosis not present

## 2020-12-04 ENCOUNTER — Encounter (INDEPENDENT_AMBULATORY_CARE_PROVIDER_SITE_OTHER): Payer: Self-pay | Admitting: Physician Assistant

## 2020-12-04 ENCOUNTER — Other Ambulatory Visit: Payer: Self-pay

## 2020-12-04 ENCOUNTER — Ambulatory Visit (INDEPENDENT_AMBULATORY_CARE_PROVIDER_SITE_OTHER): Payer: Medicare HMO | Admitting: Physician Assistant

## 2020-12-04 VITALS — BP 113/76 | HR 59 | Temp 97.7°F | Ht 62.0 in | Wt 186.0 lb

## 2020-12-04 DIAGNOSIS — Z6836 Body mass index (BMI) 36.0-36.9, adult: Secondary | ICD-10-CM

## 2020-12-04 DIAGNOSIS — R739 Hyperglycemia, unspecified: Secondary | ICD-10-CM

## 2020-12-04 DIAGNOSIS — E7849 Other hyperlipidemia: Secondary | ICD-10-CM | POA: Diagnosis not present

## 2020-12-04 DIAGNOSIS — E559 Vitamin D deficiency, unspecified: Secondary | ICD-10-CM | POA: Diagnosis not present

## 2020-12-05 ENCOUNTER — Encounter (INDEPENDENT_AMBULATORY_CARE_PROVIDER_SITE_OTHER): Payer: Self-pay | Admitting: Physician Assistant

## 2020-12-05 LAB — COMPREHENSIVE METABOLIC PANEL
ALT: 33 IU/L — ABNORMAL HIGH (ref 0–32)
AST: 22 IU/L (ref 0–40)
Albumin/Globulin Ratio: 2.4 — ABNORMAL HIGH (ref 1.2–2.2)
Albumin: 4.7 g/dL (ref 3.8–4.9)
Alkaline Phosphatase: 82 IU/L (ref 44–121)
BUN/Creatinine Ratio: 42 — ABNORMAL HIGH (ref 9–23)
BUN: 26 mg/dL — ABNORMAL HIGH (ref 6–24)
Bilirubin Total: 0.5 mg/dL (ref 0.0–1.2)
CO2: 25 mmol/L (ref 20–29)
Calcium: 9.3 mg/dL (ref 8.7–10.2)
Chloride: 105 mmol/L (ref 96–106)
Creatinine, Ser: 0.62 mg/dL (ref 0.57–1.00)
Globulin, Total: 2 g/dL (ref 1.5–4.5)
Glucose: 102 mg/dL — ABNORMAL HIGH (ref 65–99)
Potassium: 4.9 mmol/L (ref 3.5–5.2)
Sodium: 145 mmol/L — ABNORMAL HIGH (ref 134–144)
Total Protein: 6.7 g/dL (ref 6.0–8.5)
eGFR: 106 mL/min/{1.73_m2} (ref >59)

## 2020-12-05 LAB — LIPID PANEL
Chol/HDL Ratio: 5.1 ratio — ABNORMAL HIGH (ref 0.0–4.4)
Cholesterol, Total: 158 mg/dL (ref 100–199)
HDL: 31 mg/dL — ABNORMAL LOW (ref >39)
LDL Chol Calc (NIH): 99 mg/dL (ref 0–99)
Triglycerides: 160 mg/dL — ABNORMAL HIGH (ref 0–149)
VLDL Cholesterol Cal: 28 mg/dL (ref 5–40)

## 2020-12-05 LAB — VITAMIN D 25 HYDROXY (VIT D DEFICIENCY, FRACTURES): Vit D, 25-Hydroxy: 58.9 ng/mL (ref 30.0–100.0)

## 2020-12-05 LAB — HEMOGLOBIN A1C
Est. average glucose Bld gHb Est-mCnc: 111 mg/dL
Hgb A1c MFr Bld: 5.5 % (ref 4.8–5.6)

## 2020-12-05 LAB — INSULIN, RANDOM: INSULIN: 10.4 u[IU]/mL (ref 2.6–24.9)

## 2020-12-05 NOTE — Telephone Encounter (Signed)
Please advise 

## 2020-12-09 ENCOUNTER — Ambulatory Visit: Payer: Medicare HMO | Admitting: Family

## 2020-12-11 DIAGNOSIS — M79671 Pain in right foot: Secondary | ICD-10-CM | POA: Diagnosis not present

## 2020-12-11 DIAGNOSIS — M79672 Pain in left foot: Secondary | ICD-10-CM | POA: Diagnosis not present

## 2020-12-11 NOTE — Progress Notes (Signed)
Chief Complaint:   OBESITY Brittney Ruiz is here to discuss her progress with her obesity treatment plan along with follow-up of her obesity related diagnoses. Brittney Ruiz is on keeping a food journal and adhering to recommended goals of 1300-1500 calories and 95 grams of protein daily and states she is following her eating plan approximately 90-95% of the time. Brittney Ruiz states she is doing physical therapy for 45 minutes 1 time per week, and physical therapy at home for 20-25 minutes 7 times per week.  Today's visit was #: 20 Starting weight: 217 lbs Starting date: 06/01/2019 Today's weight: 186 lbs Today's date: 12/04/2020 Total lbs lost to date: 31 Total lbs lost since last in-office visit: 0  Interim History: Brittney Ruiz just started Repatha 1.5 a weeks ago and she reports that her fasting blood sugars have been elevated iin the morning into the 140's. She is averaging between 1450-1600 calories daily and exceeding her protein goal.  Subjective:   1. Hyperlipidemia  Brittney Ruiz was recently put on Repatha by her Cardiologist. She is achy and has headaches, and she reports her blood sugars have increased.  2. Hyperglycemia Brittney Ruiz's last A1c was 5.5., and she is not on medications. She denies polyphagia.  3. Vitamin D deficiency Brittney Ruiz is on Vit D 1,000 units and she is tolerating it well.  Assessment/Plan:   1. Hyperlipidemia  Cardiovascular risk and specific lipid/LDL goals reviewed. We discussed several lifestyle modifications today. We will check labs today. Brittney Ruiz will continue to follow up with lipid specialist, and will continue to work on diet, exercise and weight loss efforts. Orders and follow up as documented in patient record.   Counseling Intensive lifestyle modifications are the first line treatment for this issue. Dietary changes: Increase soluble fiber. Decrease simple carbohydrates. Exercise changes: Moderate to vigorous-intensity aerobic activity 150 minutes per week if tolerated. Lipid-lowering  medications: see documented in medical record.  - Lipid panel  2. Hyperglycemia We will check labs today, and results with be discussed with Brittney Ruiz in 4-6 weeks at her follow up visit. In the meanwhile Brittney Ruiz will continue her meal plan and will work on weight loss efforts.  - Comprehensive metabolic panel - Hemoglobin A1c - Insulin, random  3. Vitamin D deficiency Low Vitamin D level contributes to fatigue and are associated with obesity, breast, and colon cancer. We will check labs today. Brittney Ruiz will continue taking Vitamin D and will follow-up for routine testing of Vitamin D, at least 2-3 times per year to avoid over-replacement.  - VITAMIN D 25 Hydroxy (Vit-D Deficiency, Fractures)  4. Class 2 severe obesity with serious comorbidity and body mass index (BMI) of 36.0 to 36.9 in adult, unspecified obesity type ([place]), current bmi 34.01 Brittney Ruiz is currently in the action stage of change. As such, her goal is to continue with weight loss efforts. She has agreed to keeping a food journal and adhering to recommended goals of 1200-1300 calories and 90 grams of protein daily.   Exercise goals: As is.  Behavioral modification strategies: planning for success and keeping a strict food journal.  Brittney Ruiz has agreed to follow-up with our clinic in 4 to 6 weeks. She was informed of the importance of frequent follow-up visits to maximize her success with intensive lifestyle modifications for her multiple health conditions.   Brittney Ruiz was informed we would discuss her lab results at her next visit unless there is a critical issue that needs to be addressed sooner. Brittney Ruiz agreed to keep her next visit at the agreed  upon time to discuss these results.  Objective:   Blood pressure 113/76, pulse (!) 59, temperature 97.7 F (36.5 C), height '5\' 2"'$  (1.575 m), weight 186 lb (84.4 kg), SpO2 97 %. Body mass index is 34.02 kg/m.  General: Cooperative, alert, well developed, in no acute distress. HEENT: Conjunctivae and  lids unremarkable. Cardiovascular: Regular rhythm.  Lungs: Normal work of breathing. Neurologic: No focal deficits.   Lab Results  Component Value Date   CREATININE 0.62 12/04/2020   BUN 26 (H) 12/04/2020   NA 145 (H) 12/04/2020   K 4.9 12/04/2020   CL 105 12/04/2020   CO2 25 12/04/2020   Lab Results  Component Value Date   ALT 33 (H) 12/04/2020   AST 22 12/04/2020   ALKPHOS 82 12/04/2020   BILITOT 0.5 12/04/2020   Lab Results  Component Value Date   HGBA1C 5.5 12/04/2020   HGBA1C 5.5 08/08/2020   HGBA1C 5.4 05/07/2020   HGBA1C 5.4 11/14/2019   HGBA1C 5.4 06/01/2019   Lab Results  Component Value Date   INSULIN 10.4 12/04/2020   INSULIN 10.7 08/08/2020   INSULIN 8.2 05/07/2020   INSULIN 8.5 11/14/2019   INSULIN 26.1 (H) 06/01/2019   Lab Results  Component Value Date   TSH 1.810 06/01/2019   Lab Results  Component Value Date   CHOL 158 12/04/2020   HDL 31 (L) 12/04/2020   LDLCALC 99 12/04/2020   LDLDIRECT 141.0 05/02/2018   TRIG 160 (H) 12/04/2020   CHOLHDL 5.1 (H) 12/04/2020   Lab Results  Component Value Date   VD25OH 58.9 12/04/2020   VD25OH 45.2 08/08/2020   VD25OH 48.8 05/07/2020   Lab Results  Component Value Date   WBC 7.5 10/07/2020   HGB 15.4 (H) 10/07/2020   HCT 45.5 10/07/2020   MCV 86.2 10/07/2020   PLT 152 10/07/2020   Lab Results  Component Value Date   FERRITIN 111 08/12/2012    Obesity Behavioral Intervention:   Approximately 15 minutes were spent on the discussion below.  ASK: We discussed the diagnosis of obesity with Brittney Ruiz today and Brittney Ruiz agreed to give [name] permission to discuss obesity behavioral modification therapy today.  ASSESS: Brittney Ruiz has the diagnosis of obesity and her BMI today is 34.01. Brittney Ruiz is in the action stage of change.   ADVISE: Brittney Ruiz was educated on the multiple health risks of obesity as well as the benefit of weight loss to improve her health. She was advised of the need for long term treatment and the  importance of lifestyle modifications to improve her current health and to decrease her risk of future health problems.  AGREE: Multiple dietary modification options and treatment options were discussed and Brittney Ruiz agreed to follow the recommendations documented in the above note.  ARRANGE: Brittney Ruiz was educated on the importance of frequent visits to treat obesity as outlined per CMS and USPSTF guidelines and agreed to schedule her next follow up appointment today.  Attestation Statements:   Reviewed by clinician on day of visit: allergies, medications, problem list, medical history, surgical history, family history, social history, and previous encounter notes.   Wilhemena Durie, am acting as transcriptionist for [employer], PA-C.  I have reviewed the above documentation for accuracy and completeness, and I agree with the above. Abby Potash, PA-C

## 2020-12-21 ENCOUNTER — Other Ambulatory Visit: Payer: Self-pay | Admitting: Family

## 2020-12-25 ENCOUNTER — Encounter (INDEPENDENT_AMBULATORY_CARE_PROVIDER_SITE_OTHER): Payer: Self-pay

## 2020-12-25 DIAGNOSIS — M545 Low back pain, unspecified: Secondary | ICD-10-CM | POA: Diagnosis not present

## 2020-12-25 DIAGNOSIS — G8929 Other chronic pain: Secondary | ICD-10-CM | POA: Diagnosis not present

## 2020-12-26 ENCOUNTER — Encounter (INDEPENDENT_AMBULATORY_CARE_PROVIDER_SITE_OTHER): Payer: Self-pay | Admitting: Physician Assistant

## 2020-12-30 NOTE — Telephone Encounter (Signed)
Please review and advise.

## 2021-01-06 ENCOUNTER — Ambulatory Visit (INDEPENDENT_AMBULATORY_CARE_PROVIDER_SITE_OTHER): Payer: Medicare HMO | Admitting: Physician Assistant

## 2021-01-22 DIAGNOSIS — N3946 Mixed incontinence: Secondary | ICD-10-CM | POA: Diagnosis not present

## 2021-01-27 DIAGNOSIS — R42 Dizziness and giddiness: Secondary | ICD-10-CM | POA: Diagnosis not present

## 2021-01-30 ENCOUNTER — Ambulatory Visit (INDEPENDENT_AMBULATORY_CARE_PROVIDER_SITE_OTHER): Payer: Medicare HMO | Admitting: Physician Assistant

## 2021-02-05 ENCOUNTER — Other Ambulatory Visit: Payer: Self-pay

## 2021-02-05 ENCOUNTER — Ambulatory Visit (INDEPENDENT_AMBULATORY_CARE_PROVIDER_SITE_OTHER): Payer: Medicare HMO | Admitting: Family Medicine

## 2021-02-05 ENCOUNTER — Encounter (INDEPENDENT_AMBULATORY_CARE_PROVIDER_SITE_OTHER): Payer: Self-pay | Admitting: Family Medicine

## 2021-02-05 VITALS — BP 117/80 | HR 63 | Temp 97.6°F | Ht 62.0 in | Wt 183.0 lb

## 2021-02-05 DIAGNOSIS — N3946 Mixed incontinence: Secondary | ICD-10-CM | POA: Diagnosis not present

## 2021-02-05 DIAGNOSIS — E8881 Metabolic syndrome: Secondary | ICD-10-CM

## 2021-02-05 DIAGNOSIS — Z6839 Body mass index (BMI) 39.0-39.9, adult: Secondary | ICD-10-CM | POA: Diagnosis not present

## 2021-02-06 DIAGNOSIS — N3946 Mixed incontinence: Secondary | ICD-10-CM | POA: Diagnosis not present

## 2021-02-06 DIAGNOSIS — F4001 Agoraphobia with panic disorder: Secondary | ICD-10-CM | POA: Diagnosis not present

## 2021-02-06 DIAGNOSIS — F410 Panic disorder [episodic paroxysmal anxiety] without agoraphobia: Secondary | ICD-10-CM | POA: Diagnosis not present

## 2021-02-06 DIAGNOSIS — F39 Unspecified mood [affective] disorder: Secondary | ICD-10-CM | POA: Diagnosis not present

## 2021-02-06 NOTE — Progress Notes (Signed)
Chief Complaint:   OBESITY Brittney Ruiz is here to discuss her progress with her obesity treatment plan along with follow-up of her obesity related diagnoses. Brittney Ruiz is on keeping a food journal and adhering to recommended goals of 1200-1300 calories and 90 grams of protein daily and states she is following her eating plan approximately 95% of the time. Brittney Ruiz states she is doing physical therapy for 45 minutes 1 time per week.  Today's visit was #: 21 Starting weight: 217 lbs Starting date: 06/01/2019 Today's weight: 183 lbs Today's date: 02/05/2021 Total lbs lost to date: 34 Total lbs lost since last in-office visit: 3  Interim History: Brittney Ruiz was seen by Abby Potash, PA-C the past 1.5 years. She was last seen 2 months ago. She has been under a lot of stress after a couple of weeks. She has not journaled since the end of August due to no time. She notes for stress relief she prays.  Subjective:   1. Insulin resistance Brittney Ruiz denies ever being on medications for this. She stated that she didn't know she was insulin resistant.  Assessment/Plan:   1. Insulin resistance Brittney Ruiz will continue to work on weight loss, exercise, and decreasing simple carbohydrates to help decrease the risk of diabetes. Brittney Ruiz agreed to follow-up with [name] as directed to closely monitor her progress.  2. Obesity with current BMI of 33.5 Brittney Ruiz is currently in the action stage of change. As such, her goal is to continue with weight loss efforts. She has agreed to keeping a food journal and adhering to recommended goals of 1200-1300 calories and 90 grams of protein daily.   For the next couple of weeks, Brittney Ruiz is to journal and bring in her log of totals to her next office visit.  Exercise goals: As is, and walking for 5-10 minutes everyday at home.  Behavioral modification strategies: no skipping meals and keeping a strict food journal.  Brittney Ruiz has agreed to follow-up with our clinic in 4 weeks. She was informed of the  importance of frequent follow-up visits to maximize her success with intensive lifestyle modifications for her multiple health conditions.   Objective:   Blood pressure 117/80, pulse 63, temperature 97.6 F (36.4 C), height 5\' 2"  (1.575 m), weight 183 lb (83 kg), SpO2 99 %. Body mass index is 33.47 kg/m.  General: Cooperative, alert, well developed, in no acute distress. HEENT: Conjunctivae and lids unremarkable. Cardiovascular: Regular rhythm.  Lungs: Normal work of breathing. Neurologic: No focal deficits.   Lab Results  Component Value Date   CREATININE 0.62 12/04/2020   BUN 26 (H) 12/04/2020   NA 145 (H) 12/04/2020   K 4.9 12/04/2020   CL 105 12/04/2020   CO2 25 12/04/2020   Lab Results  Component Value Date   ALT 33 (H) 12/04/2020   AST 22 12/04/2020   ALKPHOS 82 12/04/2020   BILITOT 0.5 12/04/2020   Lab Results  Component Value Date   HGBA1C 5.5 12/04/2020   HGBA1C 5.5 08/08/2020   HGBA1C 5.4 05/07/2020   HGBA1C 5.4 11/14/2019   HGBA1C 5.4 06/01/2019   Lab Results  Component Value Date   INSULIN 10.4 12/04/2020   INSULIN 10.7 08/08/2020   INSULIN 8.2 05/07/2020   INSULIN 8.5 11/14/2019   INSULIN 26.1 (H) 06/01/2019   Lab Results  Component Value Date   TSH 1.810 06/01/2019   Lab Results  Component Value Date   CHOL 158 12/04/2020   HDL 31 (L) 12/04/2020   LDLCALC 99 12/04/2020  LDLDIRECT 141.0 05/02/2018   TRIG 160 (H) 12/04/2020   CHOLHDL 5.1 (H) 12/04/2020   Lab Results  Component Value Date   VD25OH 58.9 12/04/2020   VD25OH 45.2 08/08/2020   VD25OH 48.8 05/07/2020   Lab Results  Component Value Date   WBC 7.5 10/07/2020   HGB 15.4 (H) 10/07/2020   HCT 45.5 10/07/2020   MCV 86.2 10/07/2020   PLT 152 10/07/2020   Lab Results  Component Value Date   FERRITIN 111 08/12/2012   Attestation Statements:   Reviewed by clinician on day of visit: allergies, medications, problem list, medical history, surgical history, family history,  social history, and previous encounter notes.  Time spent on visit including pre-visit chart review and post-visit care and charting was 20 minutes.    Wilhemena Durie, am acting as transcriptionist for [employer], DO.  I have reviewed the above documentation for accuracy and completeness, and I agree with the above. Marjory Sneddon, D.O.  The [place] was signed into law in 2016 which includes the topic of electronic health records.  This provides immediate access to information in MyChart.  This includes consultation notes, operative notes, office notes, lab results and pathology reports.  If you have any questions about what you read please let us know at your next visit so we can discuss your concerns and take corrective action if need be.  We are right here with you.

## 2021-02-10 DIAGNOSIS — N3946 Mixed incontinence: Secondary | ICD-10-CM | POA: Diagnosis not present

## 2021-02-12 ENCOUNTER — Other Ambulatory Visit: Payer: Self-pay | Admitting: Family

## 2021-02-12 DIAGNOSIS — R42 Dizziness and giddiness: Secondary | ICD-10-CM | POA: Diagnosis not present

## 2021-02-12 DIAGNOSIS — N3946 Mixed incontinence: Secondary | ICD-10-CM | POA: Diagnosis not present

## 2021-02-27 ENCOUNTER — Ambulatory Visit (INDEPENDENT_AMBULATORY_CARE_PROVIDER_SITE_OTHER): Payer: Medicare HMO | Admitting: Physician Assistant

## 2021-03-03 ENCOUNTER — Other Ambulatory Visit: Payer: Self-pay

## 2021-03-03 ENCOUNTER — Ambulatory Visit (INDEPENDENT_AMBULATORY_CARE_PROVIDER_SITE_OTHER): Payer: Medicare HMO | Admitting: Family

## 2021-03-03 ENCOUNTER — Encounter: Payer: Self-pay | Admitting: Family

## 2021-03-03 VITALS — BP 110/82 | HR 62 | Temp 98.2°F | Ht 62.0 in | Wt 185.2 lb

## 2021-03-03 DIAGNOSIS — Z23 Encounter for immunization: Secondary | ICD-10-CM

## 2021-03-03 DIAGNOSIS — Z0000 Encounter for general adult medical examination without abnormal findings: Secondary | ICD-10-CM | POA: Diagnosis not present

## 2021-03-03 DIAGNOSIS — Z1231 Encounter for screening mammogram for malignant neoplasm of breast: Secondary | ICD-10-CM

## 2021-03-03 LAB — COMPREHENSIVE METABOLIC PANEL
ALT: 38 U/L — ABNORMAL HIGH (ref 0–35)
AST: 25 U/L (ref 0–37)
Albumin: 4.3 g/dL (ref 3.5–5.2)
Alkaline Phosphatase: 84 U/L (ref 39–117)
BUN: 24 mg/dL — ABNORMAL HIGH (ref 6–23)
CO2: 29 mEq/L (ref 19–32)
Calcium: 9.1 mg/dL (ref 8.4–10.5)
Chloride: 103 mEq/L (ref 96–112)
Creatinine, Ser: 0.71 mg/dL (ref 0.40–1.20)
GFR: 96.64 mL/min (ref >60.00)
Glucose, Bld: 92 mg/dL (ref 70–99)
Potassium: 4.1 mEq/L (ref 3.5–5.1)
Sodium: 139 mEq/L (ref 135–145)
Total Bilirubin: 0.5 mg/dL (ref 0.2–1.2)
Total Protein: 6.4 g/dL (ref 6.0–8.3)

## 2021-03-03 LAB — TSH: TSH: 1.48 u[IU]/mL (ref 0.35–5.50)

## 2021-03-03 NOTE — Assessment & Plan Note (Signed)
Clinical breast exam performed today.  Patient will schedule mammogram in December of this year, we have ordered.  Colonoscopy is up-to-date.  Congratulated her on diligence to diet and significant weight loss.  She will also be due for CT chest lung cancer screen in December which she is aware.  She understands to let me know if [facility]ology does not contact her for this test

## 2021-03-03 NOTE — Progress Notes (Signed)
Subjective:    Patient ID: Brittney Ruiz, female    DOB: 04-04-1966, 55 y.o.   MRN: [id number]  CC: Brittney Ruiz is a 55 y.o. female who presents today for physical exam.    HPI: Feels well today No new complaints  She has lost 50lbs in total.  She remains very dedicated to high-protein diet. She is following with [facility], Dr Holly Bodily ,for vestibular rehab with improvement of symptoms.  Following with [facility] whom prescribes zoloft 112.5 qm and 25mg  qpm and tranxene.  She continues to follow with [facility] for weight loss.  History of melanoma continues to follow with dermatology. follow-up with cardiology, Dr. Debara Pickett 10/18/2020 for mixed dyslipidemia.  Reports no CAD.  Statin and acetamide intolerance. He started her her on PCSK9 inhibitor, repatha however she stopped as glucose increase.   Colorectal Cancer Screening: UTD, Dr Bonna Gains 2019.  Repeat in 5 years time Breast Cancer Screening: Mammogram UTD Cervical Cancer Screening: History of hysterectomy.  NO CERVIX on exam 03/06/20         Tetanus - UTD        Pneumococcal - Complete  Labs: Screening labs today done with [facility]. Exercise: No regular exercise   Alcohol use:  none Smoking/tobacco use: former smoker.  She is participating in CT chest lung cancer screening.  Lung RADS 2, last test on 05/06/2020  HISTORY:  Past Medical History:  Diagnosis Date   Abnormal Pap smear of cervix    h/o LEEP   Allergy    Angio-edema    Anxiety    Asthma    Bladder leak    Cancer ([place])    melanoma 2003   Chest pain    Constipation    COPD (chronic obstructive pulmonary disease) ([facility])    Depression    Diarrhea    Food allergy    Heart valve problem    Heartburn    History of swelling of feet    Hyperlipidemia    Hypertension    IBS (irritable bowel syndrome)    Joint pain    Low back pain    Melanoma ([place])    right foot s/p lymph node removed right groin   Obesity  (BMI 30-39.9)    Palpitation    Prediabetes    Shortness of breath    Urticaria    Vaginal delivery    X 2    Past Surgical History:  Procedure Laterality Date   BLADDER SUSPENSION  07/2017   COLONOSCOPY WITH PROPOFOL N/A 07/01/2017   Procedure: COLONOSCOPY WITH PROPOFOL;  Surgeon: Virgel Manifold, MD;  Location: [facility] ENDOSCOPY;  Service: Endoscopy;  Laterality: N/A;   ESOPHAGOGASTRODUODENOSCOPY (EGD) WITH PROPOFOL N/A 07/01/2017   Procedure: ESOPHAGOGASTRODUODENOSCOPY (EGD) WITH PROPOFOL;  Surgeon: Virgel Manifold, MD;  Location: [facility] ENDOSCOPY;  Service: Endoscopy;  Laterality: N/A;   LEEP  1995   LEEP     PARTIAL HYSTERECTOMY  08/2017   s/p partial hysterectomy - per patient has fallopian tubes; ovaries INTACT. NO CERVIX on exam 03/06/20   SKIN CANCER EXCISION     melanoma   TONSILLECTOMY     Family History  Problem Relation Age of Onset   Hyperlipidemia Mother    Hypertension Mother    Diabetes Mother    Breast cancer Mother 28   Stroke Mother    Cancer Mother    Cancer Father        lung   Depression Father  Diabetes Father    Hypertension Father    Hyperlipidemia Father    Sleep apnea Father    Alcoholism Father    Hyperlipidemia Sister    Hypertension Sister    Migraines Sister    Hypertension Brother    Hyperlipidemia Brother    Migraines Brother    Cancer Maternal Grandmother        glioblastoma   Depression Maternal Grandfather    Heart disease Maternal Grandfather    Cancer Paternal Grandfather        unknown   AAA (abdominal aortic aneurysm) Paternal Grandfather       ALLERGIES: Elemental sulfur, Pork allergy, Pork-derived products, Aciphex [rabeprazole sodium], Antivert [meclizine hcl], Buspar [buspirone hcl], Cat hair extract, Chlorpheniramine-pseudoeph, Chlorpheniramine-pseudoeph, Chocolate flavor, Codeine, Crestor [rosuvastatin calcium], Cymbalta [duloxetine hcl], Duloxetine, Effexor xr [venlafaxine hydrochloride], Epinephrine,  Erythromycin, Escitalopram oxalate, Lamictal [lamotrigine], Levofloxacin, Lipitor [atorvastatin], Meclizine, Mometasone furoate, Mometasone furoate, Monosodium glutamate, Neurontin [gabapentin], Other, Prednisone, Red yeast rice extract, Shellfish allergy, Sudafed [pseudoephedrine hcl], Sulfa antibiotics, Sulfasalazine, Valium, Wellbutrin [bupropion hcl], Wixela inhub [fluticasone-salmeterol], Xanax xr [alprazolam], Zetia [ezetimibe], Alfalfa, Brompheniramine-phenylephrine, Buspirone, and Tape  Current Outpatient Medications on File Prior to Visit  Medication Sig Dispense Refill   albuterol (VENTOLIN HFA) 108 (90 Base) MCG/ACT inhaler Inhale 2 puffs into the lungs every 6 (six) hours as needed. 1 each 3   Alcohol Swabs (DROPSAFE ALCOHOL PREP) 70 % PADS USE AS DIRECTED 300 each 1   aspirin EC 81 MG tablet Take 81 mg by mouth at bedtime.     B Complex Vitamins (BL VITAMIN B COMPLEX PO) +Folic acid qd.     Calcium Carb-Cholecalciferol (CALCIUM 600/VITAMIN D PO) Take 0.5 tablets by mouth daily.     Cholecalciferol (VITAMIN D-3) 25 MCG (1000 UT) CAPS Take 1 capsule by mouth daily.     clorazepate (TRANXENE) 7.5 MG tablet Take 7.5 mg by mouth 3 (three) times daily.     fexofenadine (ALLEGRA) 60 MG tablet Take 60 mg by mouth daily.     fluticasone (FLONASE) 50 MCG/ACT nasal spray USE 2 SPRAYS INTO BOTH NOSTRILS DAILY. (Patient taking differently: Place 1 spray into both nostrils at bedtime.) 48 g 2   Multiple Vitamin (MULTIVITAMIN) tablet Take 1 tablet by mouth daily.     Omega-3 Fatty Acids (FISH OIL) 1000 MG CAPS Take 1 capsule by mouth daily.     omeprazole (PRILOSEC) 20 MG capsule TAKE 1 CAPSULE EVERY DAY 90 capsule 1   polyethylene glycol powder (GLYCOLAX/MIRALAX) 17 GM/SCOOP powder Take 1 Container by mouth at bedtime.     propranolol (INDERAL) 20 MG tablet Take 1 tablet (20 mg total) by mouth 3 (three) times daily. 90 tablet 3   psyllium (METAMUCIL) 58.6 % powder Take 1 packet by mouth daily.      sertraline (ZOLOFT) 100 MG tablet TAKE 1+1/2 TABLETS BY MOUTH DAILY AS DIRECTED (Patient taking differently: Take 112.5 mg by mouth See admin instructions. 112.5 in the morning and 25 mg in the evening) 45 tablet 3   sertraline (ZOLOFT) 25 MG tablet Take 25 mg by mouth every evening.      No current facility-administered medications on file prior to visit.    Social History   Tobacco Use   Smoking status: Former    Packs/day: 1.00    Years: 25.00    Pack years: 25.00    Types: Cigarettes    Quit date: 2008    Years since quitting: 14.8   Smokeless tobacco: Never  Vaping Use  Vaping Use: Never used  Substance Use Topics   Alcohol use: No   Drug use: No    Review of Systems  Constitutional:  Negative for chills, fever and unexpected weight change.  HENT:  Negative for congestion.   Respiratory:  Negative for cough.   Cardiovascular:  Negative for chest pain, palpitations and leg swelling.  Gastrointestinal:  Negative for nausea and vomiting.  Musculoskeletal:  Negative for arthralgias and myalgias.  Skin:  Negative for rash.  Neurological:  Negative for headaches.  Hematological:  Negative for adenopathy.  Psychiatric/Behavioral:  Negative for confusion.      Objective:    BP 110/82 (BP Location: Left Arm, Patient Position: Sitting, Cuff Size: Normal)   Pulse 62   Temp 98.2 F (36.8 C) (Oral)   Ht 5\' 2"  (1.575 m)   Wt 185 lb 3.2 oz (84 kg)   LMP  (LMP Unknown)   SpO2 96%   BMI 33.87 kg/m   BP Readings from Last 3 Encounters:  03/03/21 110/82  02/05/21 117/80  12/04/20 113/76   Wt Readings from Last 3 Encounters:  03/03/21 185 lb 3.2 oz (84 kg)  02/05/21 183 lb (83 kg)  12/04/20 186 lb (84.4 kg)    Physical Exam Vitals reviewed.  Constitutional:      Appearance: Normal appearance. She is well-developed.  Eyes:     Conjunctiva/sclera: Conjunctivae normal.  Neck:     Thyroid: No thyroid mass or thyromegaly.  Cardiovascular:     Rate and Rhythm: Normal  rate and regular rhythm.     Pulses: Normal pulses.     Heart sounds: Normal heart sounds.  Pulmonary:     Effort: Pulmonary effort is normal.     Breath sounds: Normal breath sounds. No wheezing, rhonchi or rales.  Chest:  Breasts:    Breasts are symmetrical.     Right: No inverted nipple, mass, nipple discharge, skin change or tenderness.     Left: No inverted nipple, mass, nipple discharge, skin change or tenderness.  Abdominal:     General: Bowel sounds are normal. There is no distension.     Palpations: Abdomen is soft. Abdomen is not rigid. There is no fluid wave or mass.     Tenderness: There is no abdominal tenderness. There is no guarding or rebound.  Lymphadenopathy:     Head:     Right side of head: No submental, submandibular, tonsillar, preauricular, posterior auricular or occipital adenopathy.     Left side of head: No submental, submandibular, tonsillar, preauricular, posterior auricular or occipital adenopathy.     Cervical: No cervical adenopathy.     Right cervical: No superficial, deep or posterior cervical adenopathy.    Left cervical: No superficial, deep or posterior cervical adenopathy.  Skin:    General: Skin is warm and dry.  Neurological:     Mental Status: She is alert.  Psychiatric:        Speech: Speech normal.        Behavior: Behavior normal.        Thought Content: Thought content normal.       Assessment & Plan:   Problem List Items Addressed This Visit       Other   Routine physical examination    Clinical breast exam performed today.  Patient will schedule mammogram in December of this year, we have ordered.  Colonoscopy is up-to-date.  Congratulated her on diligence to diet and significant weight loss.  She will also be due for  CT chest lung cancer screen in December which she is aware.  She understands to let me know if [facility]ology does not contact her for this test      Relevant Orders   TSH   Comprehensive metabolic panel    Other Visit Diagnoses     Encounter for screening mammogram for malignant neoplasm of breast    -  Primary   Relevant Orders   MM 3D SCREEN BREAST BILATERAL   Need for immunization against influenza       Relevant Orders   Flu Vaccine QUAD 88mo+IM (Fluarix, Fluzone & Alfiuria Quad PF) (Completed)        I have discontinued Anna-Marie Coller. Rem[facility]'s Repatha [place]. I am also having her maintain her clorazepate, multivitamin, sertraline, propranolol, psyllium, B Complex Vitamins (BL VITAMIN B COMPLEX PO), aspirin EC, Fish Oil, polyethylene glycol powder, sertraline, fluticasone, fexofenadine, Vitamin D-3, albuterol, Calcium Carb-Cholecalciferol (CALCIUM 600/VITAMIN D PO), DropSafe Alcohol Prep, and omeprazole.   No orders of the defined types were placed in this encounter.   Return precautions given.   Risks, benefits, and alternatives of the medications and treatment plan prescribed today were discussed, and patient expressed understanding.   Education regarding symptom management and diagnosis given to patient on AVS.   Continue to follow with Burnard Hawthorne, FNP for routine health maintenance.   Aggie Hacker and I agreed with plan.   Mable Paris, FNP

## 2021-03-03 NOTE — Patient Instructions (Addendum)
CT lung cancer screening will be due in December of this year.  You should be contacted  by [facility] pulmonology.  Certainly if you not, please give me a call so I can help arrange this test  please call  and schedule your 3D mammogram    [facility]  [place], [place]  Always nice to see you!  Health Maintenance, Female Adopting a healthy lifestyle and getting preventive care are important in promoting health and wellness. Ask your health care provider about: The right schedule for you to have regular tests and exams. Things you can do on your own to prevent diseases and keep yourself healthy. What should I know about diet, weight, and exercise? Eat a healthy diet  Eat a diet that includes plenty of vegetables, fruits, low-fat dairy products, and lean protein. Do not eat a lot of foods that are high in solid fats, added sugars, or sodium. Maintain a healthy weight Body mass index (BMI) is used to identify weight problems. It estimates body fat based on height and weight. Your health care provider can help determine your BMI and help you achieve or maintain a healthy weight. Get regular exercise Get regular exercise. This is one of the most important things you can do for your health. Most adults should: Exercise for at least 150 minutes each week. The exercise should increase your heart rate and make you sweat (moderate-intensity exercise). Do strengthening exercises at least twice a week. This is in addition to the moderate-intensity exercise. Spend less time sitting. Even light physical activity can be beneficial. Watch cholesterol and blood lipids Have your blood tested for lipids and cholesterol at 54 years of age, then have this test every 5 years. Have your cholesterol levels checked more often if: Your lipid or cholesterol levels are high. You are older than 54 years of age. You are at high risk for heart disease. What  should I know about cancer screening? Depending on your health history and family history, you may need to have cancer screening at various ages. This may include screening for: Breast cancer. Cervical cancer. Colorectal cancer. Skin cancer. Lung cancer. What should I know about heart disease, diabetes, and high blood pressure? Blood pressure and heart disease High blood pressure causes heart disease and increases the risk of stroke. This is more likely to develop in people who have high blood pressure readings, are of African descent, or are overweight. Have your blood pressure checked: Every 3-5 years if you are 23-38 years of age. Every year if you are 66 years old or older. Diabetes Have regular diabetes screenings. This checks your fasting blood sugar level. Have the screening done: Once every three years after age 51 if you are at a normal weight and have a low risk for diabetes. More often and at a younger age if you are overweight or have a high risk for diabetes. What should I know about preventing infection? Hepatitis B If you have a higher risk for hepatitis B, you should be screened for this virus. Talk with your health care provider to find out if you are at risk for hepatitis B infection. Hepatitis C Testing is recommended for: Everyone born from [birth year] through 1964. Anyone with known risk factors for hepatitis C. Sexually transmitted infections (STIs) Get screened for STIs, including gonorrhea and chlamydia, if: You are sexually active and are younger than 54 years of age. You are older than 54 years of  age and your health care provider tells you that you are at risk for this type of infection. Your sexual activity has changed since you were last screened, and you are at increased risk for chlamydia or gonorrhea. Ask your health care provider if you are at risk. Ask your health care provider about whether you are at high risk for HIV. Your health care provider may recommend  a prescription medicine to help prevent HIV infection. If you choose to take medicine to prevent HIV, you should first get tested for HIV. You should then be tested every 3 months for as long as you are taking the medicine. Pregnancy If you are about to stop having your period (premenopausal) and you may become pregnant, seek counseling before you get pregnant. Take 400 to 800 micrograms (mcg) of folic acid every day if you become pregnant. Ask for birth control (contraception) if you want to prevent pregnancy. Osteoporosis and menopause Osteoporosis is a disease in which the bones lose minerals and strength with aging. This can result in bone fractures. If you are 6 years old or older, or if you are at risk for osteoporosis and fractures, ask your health care provider if you should: Be screened for bone loss. Take a calcium or vitamin D supplement to lower your risk of fractures. Be given hormone replacement therapy (HRT) to treat symptoms of menopause. Follow these instructions at home: Lifestyle Do not use any products that contain nicotine or tobacco, such as cigarettes, e-cigarettes, and chewing tobacco. If you need help quitting, ask your health care provider. Do not use street drugs. Do not share needles. Ask your health care provider for help if you need support or information about quitting drugs. Alcohol use Do not drink alcohol if: Your health care provider tells you not to drink. You are pregnant, may be pregnant, or are planning to become pregnant. If you drink alcohol: Limit how much you use to 0-1 drink a day. Limit intake if you are breastfeeding. Be aware of how much alcohol is in your drink. In the [place] one drink equals one 12 oz bottle of beer (355 mL), one 5 oz glass of wine (148 mL), or one 1 oz glass of hard liquor (44 mL). General instructions Schedule regular health, dental, and eye exams. Stay current with your vaccines. Tell your health care provider if: You  often feel depressed. You have ever been abused or do not feel safe at home. Summary Adopting a healthy lifestyle and getting preventive care are important in promoting health and wellness. Follow your health care provider's instructions about healthy diet, exercising, and getting tested or screened for diseases. Follow your health care provider's instructions on monitoring your cholesterol and blood pressure. This information is not intended to replace advice given to you by your health care provider. Make sure you discuss any questions you have with your health care provider. Document Revised: 07/12/2020 Document Reviewed: 04/27/2018 Elsevier Patient Education  2022 [employer].

## 2021-03-04 ENCOUNTER — Other Ambulatory Visit: Payer: Self-pay | Admitting: Family

## 2021-03-04 DIAGNOSIS — R748 Abnormal levels of other serum enzymes: Secondary | ICD-10-CM

## 2021-03-05 DIAGNOSIS — N3946 Mixed incontinence: Secondary | ICD-10-CM | POA: Diagnosis not present

## 2021-03-12 DIAGNOSIS — N3946 Mixed incontinence: Secondary | ICD-10-CM | POA: Diagnosis not present

## 2021-03-12 DIAGNOSIS — R42 Dizziness and giddiness: Secondary | ICD-10-CM | POA: Diagnosis not present

## 2021-03-17 ENCOUNTER — Other Ambulatory Visit: Payer: Self-pay

## 2021-03-17 ENCOUNTER — Encounter (INDEPENDENT_AMBULATORY_CARE_PROVIDER_SITE_OTHER): Payer: Self-pay | Admitting: Family Medicine

## 2021-03-17 ENCOUNTER — Ambulatory Visit (INDEPENDENT_AMBULATORY_CARE_PROVIDER_SITE_OTHER): Payer: Medicare HMO | Admitting: Family Medicine

## 2021-03-17 VITALS — BP 105/69 | HR 67 | Temp 97.6°F | Ht 62.0 in | Wt 180.0 lb

## 2021-03-17 DIAGNOSIS — Z6839 Body mass index (BMI) 39.0-39.9, adult: Secondary | ICD-10-CM

## 2021-03-17 DIAGNOSIS — F418 Other specified anxiety disorders: Secondary | ICD-10-CM

## 2021-03-17 NOTE — Progress Notes (Signed)
Chief Complaint:   OBESITY Brittney Ruiz is here to discuss her progress with her obesity treatment plan along with follow-up of her obesity related diagnoses. Brittney Ruiz is on keeping a food journal and adhering to recommended goals of 1200-1300 calories and 90 grams of protein and states she is following her eating plan approximately 95% of the time. Brittney Ruiz states she is doing physical therapy for 30 minutes 4 times per week.  Today's visit was #: 22 Starting weight: 217 lbs Starting date: 06/01/2019 Today's weight: 180 lbs Today's date: 03/17/2021 Total lbs lost to date: 37 lbs Total lbs lost since last in-office visit: 3 lbs  Interim History: Brittney Ruiz is down a total of 50 lbs since 2020.  She started working on weight loss before she started the program in January 2021. She is journaling consistently and meeting protein goals. She feels her appetite is mostly satisfied. She does great with water drink and has been told by her urogynecologist she drinks too much water. She has issues with urinary incontinence.  She has reduced intake of dairy at the recommendation of her alternative medicine provider.    Subjective:   1. Depression with anxiety Issue managed by psychiatry. She is on Sertraline, Tranxene and Propranolol. Her depression is well controlled with medication although she notes increased family stress recently.   Assessment/Plan:   1. Depression with anxiety  Marilin will follow up with psychiatry. She will continue all medications. Orders and follow up as documented in patient record.    2. Obesity with current BMI of 32.91 Brittney Ruiz is currently in the action stage of change. As such, her goal is to continue with weight loss efforts. She has agreed to keeping a food journal and adhering to recommended goals of 1300-1400 calories and 90 grams of protein daily.  We discussed goal weight and decided on 135 lbs (24 BMI). Initially, Brittney Ruiz chose 120 lbs but we compromised. I also advised that  140-150 lbs would be ok.   Exercise goals:  As is.  Behavioral modification strategies: planning for success and keeping a strict food journal.  Brittney Ruiz has agreed to follow-up with our clinic in 4 weeks with Abby Potash, PA-C.  Objective:   Blood pressure 105/69, pulse 67, temperature 97.6 F (36.4 C), height 5\' 2"  (1.575 m), weight 180 lb (81.6 kg), SpO2 97 %. Body mass index is 32.92 kg/m.  General: Cooperative, alert, well developed, in no acute distress. HEENT: Conjunctivae and lids unremarkable. Cardiovascular: Regular rhythm.  Lungs: Normal work of breathing. Neurologic: No focal deficits.   Lab Results  Component Value Date   CREATININE 0.71 03/03/2021   BUN 24 (H) 03/03/2021   NA 139 03/03/2021   K 4.1 03/03/2021   CL 103 03/03/2021   CO2 29 03/03/2021   Lab Results  Component Value Date   ALT 38 (H) 03/03/2021   AST 25 03/03/2021   ALKPHOS 84 03/03/2021   BILITOT 0.5 03/03/2021   Lab Results  Component Value Date   HGBA1C 5.5 12/04/2020   HGBA1C 5.5 08/08/2020   HGBA1C 5.4 05/07/2020   HGBA1C 5.4 11/14/2019   HGBA1C 5.4 06/01/2019   Lab Results  Component Value Date   INSULIN 10.4 12/04/2020   INSULIN 10.7 08/08/2020   INSULIN 8.2 05/07/2020   INSULIN 8.5 11/14/2019   INSULIN 26.1 (H) 06/01/2019   Lab Results  Component Value Date   TSH 1.48 03/03/2021   Lab Results  Component Value Date   CHOL 158 12/04/2020  HDL 31 (L) 12/04/2020   LDLCALC 99 12/04/2020   LDLDIRECT 141.0 05/02/2018   TRIG 160 (H) 12/04/2020   CHOLHDL 5.1 (H) 12/04/2020   Lab Results  Component Value Date   VD25OH 58.9 12/04/2020   VD25OH 45.2 08/08/2020   VD25OH 48.8 05/07/2020   Lab Results  Component Value Date   WBC 7.5 10/07/2020   HGB 15.4 (H) 10/07/2020   HCT 45.5 10/07/2020   MCV 86.2 10/07/2020   PLT 152 10/07/2020   Lab Results  Component Value Date   FERRITIN 111 08/12/2012   Attestation Statements:   Reviewed by clinician on day of visit:  allergies, medications, problem list, medical history, surgical history, family history, social history, and previous encounter notes.  I, Lizbeth Bark, RMA, am acting as Location manager for [employer], [place].   I have reviewed the above documentation for accuracy and completeness, and I agree with the above. -  Georgianne Fick, FNP

## 2021-03-18 ENCOUNTER — Other Ambulatory Visit: Payer: Self-pay | Admitting: Family

## 2021-03-19 ENCOUNTER — Ambulatory Visit
Admission: RE | Admit: 2021-03-19 | Discharge: 2021-03-19 | Disposition: A | Discharge status: Home / Self Care | Payer: Medicare HMO | Source: Ambulatory Visit | Attending: Family | Admitting: Family

## 2021-03-19 ENCOUNTER — Other Ambulatory Visit: Payer: Self-pay

## 2021-03-19 DIAGNOSIS — R748 Abnormal levels of other serum enzymes: Secondary | ICD-10-CM | POA: Insufficient documentation

## 2021-03-19 DIAGNOSIS — K760 Fatty (change of) liver, not elsewhere classified: Secondary | ICD-10-CM | POA: Diagnosis not present

## 2021-03-19 DIAGNOSIS — R945 Abnormal results of liver function studies: Secondary | ICD-10-CM | POA: Diagnosis not present

## 2021-03-20 ENCOUNTER — Ambulatory Visit: Payer: Medicare HMO | Admitting: Dermatology

## 2021-03-20 ENCOUNTER — Encounter: Payer: Self-pay | Admitting: Dermatology

## 2021-03-20 DIAGNOSIS — D1800 Hemangioma unspecified site: Secondary | ICD-10-CM

## 2021-03-20 DIAGNOSIS — L905 Scar conditions and fibrosis of skin: Secondary | ICD-10-CM

## 2021-03-20 DIAGNOSIS — D229 Melanocytic nevi, unspecified: Secondary | ICD-10-CM

## 2021-03-20 DIAGNOSIS — Z1283 Encounter for screening for malignant neoplasm of skin: Secondary | ICD-10-CM

## 2021-03-20 DIAGNOSIS — Z85820 Personal history of malignant melanoma of skin: Secondary | ICD-10-CM | POA: Diagnosis not present

## 2021-03-20 DIAGNOSIS — L570 Actinic keratosis: Secondary | ICD-10-CM | POA: Diagnosis not present

## 2021-03-20 DIAGNOSIS — L578 Other skin changes due to chronic exposure to nonionizing radiation: Secondary | ICD-10-CM | POA: Diagnosis not present

## 2021-03-20 DIAGNOSIS — L814 Other melanin hyperpigmentation: Secondary | ICD-10-CM

## 2021-03-20 DIAGNOSIS — L720 Epidermal cyst: Secondary | ICD-10-CM | POA: Diagnosis not present

## 2021-03-20 DIAGNOSIS — L821 Other seborrheic keratosis: Secondary | ICD-10-CM

## 2021-03-20 NOTE — Patient Instructions (Signed)
Cryotherapy Aftercare  Wash gently with soap and water everyday.   Apply Vaseline and Band-Aid daily until healed.   Prior to procedure, discussed risks of blister formation, small wound, skin dyspigmentation, or rare scar following cryotherapy. Recommend Vaseline ointment to treated areas while healing.  Recommend taking Heliocare sun protection supplement daily in sunny weather for additional sun protection. For maximum protection on the sunniest days, you can take up to 2 capsules of regular Heliocare OR take 1 capsule of Heliocare Ultra. For prolonged exposure (such as a full day in the sun), you can repeat your dose of the supplement 4 hours after your first dose. Heliocare can be purchased at [facility] or at [email or web address].    Melanoma ABCDEs  Melanoma is the most dangerous type of skin cancer, and is the leading cause of death from skin disease.  You are more likely to develop melanoma if you: Have light-colored skin, light-colored eyes, or red or blond hair Spend a lot of time in the sun Tan regularly, either outdoors or in a tanning bed Have had blistering sunburns, especially during childhood Have a close family member who has had a melanoma Have atypical moles or large birthmarks  Early detection of melanoma is key since treatment is typically straightforward and cure rates are extremely high if we catch it early.   The first sign of melanoma is often a change in a mole or a new dark spot.  The ABCDE system is a way of remembering the signs of melanoma.  A for asymmetry:  The two halves do not match. B for border:  The edges of the growth are irregular. C for color:  A mixture of colors are present instead of an even brown color. D for diameter:  Melanomas are usually (but not always) greater than 37mm - the size of a pencil eraser. E for evolution:  The spot keeps changing in size, shape, and color.  Please check your skin once per month between visits. You can  use a small mirror in front and a large mirror behind you to keep an eye on the back side or your body.   If you see any new or changing lesions before your next follow-up, please call to schedule a visit.  Please continue daily skin protection including broad spectrum sunscreen SPF 30+ to sun-exposed areas, reapplying every 2 hours as needed when you're outdoors.    If you have any questions or concerns for your doctor, please call our main line at [phone] and press option 4 to reach your doctor's medical assistant. If no one answers, please leave a voicemail as directed and we will return your call as soon as possible. Messages left after 4 pm will be answered the following business day.   You may also send [name] a message via [place]. We typically respond to MyChart messages within 1-2 business days.  For prescription refills, please ask your pharmacy to contact our office. Our fax number is [phone].  If you have an urgent issue when the clinic is closed that cannot wait until the next business day, you can page your doctor at the number below.    Please note that while we do our best to be available for urgent issues outside of office hours, we are not available 24/7.   If you have an urgent issue and are unable to reach [name], you may choose to seek medical care at your doctor's office, retail clinic, urgent care center, or emergency room.  If you have a medical emergency, please immediately call 911 or go to the emergency department.  Pager Numbers  - Dr. Nehemiah Massed: [phone]  - Dr. Laurence Ferrari: [phone]  - Dr. Nicole Kindred: [phone]  In the event of inclement weather, please call our main line at [phone] for an update on the status of any delays or closures.  Dermatology Medication Tips: Please keep the boxes that topical medications come in in order to help keep track of the instructions about where and how to use these. Pharmacies typically print the medication  instructions only on the boxes and not directly on the medication tubes.   If your medication is too expensive, please contact our office at [phone] option 4 or send [name] a message through [place].   We are unable to tell what your co-pay for medications will be in advance as this is different depending on your insurance coverage. However, we may be able to find a substitute medication at lower cost or fill out paperwork to get insurance to cover a needed medication.   If a prior authorization is required to get your medication covered by your insurance company, please allow [name] 1-2 business days to complete this process.  Drug prices often vary depending on where the prescription is filled and some pharmacies may offer cheaper prices.  The website www.goodrx.com contains coupons for medications through different pharmacies. The prices here do not account for what the cost may be with help from insurance (it may be cheaper with your insurance), but the website can give you the price if you did not use any insurance.  - You can print the associated coupon and take it with your prescription to the pharmacy.  - You may also stop by our office during regular business hours and pick up a GoodRx coupon card.  - If you need your prescription sent electronically to a different pharmacy, notify our office through [facility] or by phone at [phone] option 4.

## 2021-03-20 NOTE — Progress Notes (Signed)
Follow-Up Visit   Subjective  Brittney Ruiz is a 55 y.o. female who presents for the following: FBSE (Patient here for full body skin exam and skin cancer screening. Patient with hx of melanoma at top of right foor, treated by Dr. Sharlett Iles and lymph nodes removed. She does have a tender spot at left frontal scalp where biopsy was done a few years ago in our office, showed lipoma.).  Patient has a few other spots to point out to be checked.   The following portions of the chart were reviewed this encounter and updated as appropriate:   Tobacco  Allergies  Meds  Problems  Med Hx  Surg Hx  Fam Hx      Review of Systems:  No other skin or systemic complaints except as noted in HPI or Assessment and Plan.  Objective  Well appearing patient in no apparent distress; mood and affect are within normal limits.  A full examination was performed including scalp, head, eyes, ears, nose, lips, neck, chest, axillae, abdomen, back, buttocks, bilateral upper extremities, bilateral lower extremities, hands, feet, fingers, toes, fingernails, and toenails. All findings within normal limits unless otherwise noted below.  left frontal scalp Scar with adjacent very thin residual lipoma  right cheek Erythematous thin papules/macules with gritty scale.   left inguinal fold Subcutaneous nodule   Assessment & Plan  Scar left frontal scalp  Some tenderness at site Discussed option of intralesional triamcinolone, patient defers  AK (actinic keratosis) right cheek  Prior to procedure, discussed risks of blister formation, small wound, skin dyspigmentation, or rare scar following cryotherapy. Recommend Vaseline ointment to treated areas while healing.  Prior to procedure, discussed risks of blister formation, small wound, skin dyspigmentation, or rare scar following cryotherapy. Recommend Vaseline ointment to treated areas while healing.   Destruction of lesion - right cheek  Destruction  method: cryotherapy   Informed consent: discussed and consent obtained   Lesion destroyed using liquid nitrogen: Yes   Cryotherapy cycles:  2 Outcome: patient tolerated procedure well with no complications   Post-procedure details: wound care instructions given    Epidermal inclusion cyst left inguinal fold  Benign-appearing. Exam most consistent with an epidermal inclusion cyst. Discussed that a cyst is a benign growth that can grow over time and sometimes get irritated or inflamed. Recommend observation if it is not bothersome. Discussed option of surgical excision to remove it if it is growing, symptomatic, or other changes noted. Please call for new or changing lesions so they can be evaluated.    History of Melanoma - No evidence of recurrence today - No lymphadenopathy - Recommend regular full body skin exams - Recommend daily broad spectrum sunscreen SPF 30+ to sun-exposed areas, reapply every 2 hours as needed.  - Call if any new or changing lesions are noted between office visits  Lentigines - Scattered tan macules - Due to sun exposure - Benign-appearing, observe - Recommend daily broad spectrum sunscreen SPF 30+ to sun-exposed areas, reapply every 2 hours as needed. - Call for any changes  Seborrheic Keratoses - Stuck-on, waxy, tan-brown papules and/or plaques  - Benign-appearing - Discussed benign etiology and prognosis. - Observe - Call for any changes  Melanocytic Nevi - Tan-brown and/or pink-flesh-colored symmetric macules and papules - Benign appearing on exam today - Observation - Call clinic for new or changing moles - Recommend daily use of broad spectrum spf 30+ sunscreen to sun-exposed areas.   Hemangiomas - Red papules - Discussed benign nature -  Observe - Call for any changes  Actinic Damage - Chronic condition, secondary to cumulative UV/sun exposure - diffuse scaly erythematous macules with underlying dyspigmentation - Recommend daily broad  spectrum sunscreen SPF 30+ to sun-exposed areas, reapply every 2 hours as needed.  - Staying in the shade or wearing long sleeves, sun glasses (UVA+UVB protection) and wide brim hats (4-inch brim around the entire circumference of the hat) are also recommended for sun protection.  - Call for new or changing lesions.  Skin cancer screening performed today.  Return in about 1 year (around 03/20/2022) for TBSE.  Graciella Belton, RMA, am acting as scribe for Forest Gleason, MD . Documentation: I have reviewed the above documentation for accuracy and completeness, and I agree with the above.  Forest Gleason, MD

## 2021-03-24 ENCOUNTER — Other Ambulatory Visit: Payer: Self-pay

## 2021-03-24 ENCOUNTER — Other Ambulatory Visit (INDEPENDENT_AMBULATORY_CARE_PROVIDER_SITE_OTHER): Payer: Medicare HMO

## 2021-03-24 DIAGNOSIS — R748 Abnormal levels of other serum enzymes: Secondary | ICD-10-CM

## 2021-03-24 DIAGNOSIS — R69 Illness, unspecified: Secondary | ICD-10-CM | POA: Diagnosis not present

## 2021-03-25 ENCOUNTER — Other Ambulatory Visit: Payer: Medicare HMO

## 2021-03-25 LAB — HEPATIC FUNCTION PANEL
ALT: 26 U/L (ref 0–35)
AST: 21 U/L (ref 0–37)
Albumin: 4.5 g/dL (ref 3.5–5.2)
Alkaline Phosphatase: 68 U/L (ref 39–117)
Bilirubin, Direct: 0.1 mg/dL (ref 0.0–0.3)
Total Bilirubin: 0.5 mg/dL (ref 0.2–1.2)
Total Protein: 6.6 g/dL (ref 6.0–8.3)

## 2021-03-26 ENCOUNTER — Other Ambulatory Visit: Payer: Self-pay

## 2021-03-26 ENCOUNTER — Encounter: Payer: Self-pay | Admitting: Family

## 2021-03-26 DIAGNOSIS — R748 Abnormal levels of other serum enzymes: Secondary | ICD-10-CM

## 2021-03-26 DIAGNOSIS — K760 Fatty (change of) liver, not elsewhere classified: Secondary | ICD-10-CM | POA: Insufficient documentation

## 2021-03-31 DIAGNOSIS — N3946 Mixed incontinence: Secondary | ICD-10-CM | POA: Diagnosis not present

## 2021-03-31 DIAGNOSIS — R42 Dizziness and giddiness: Secondary | ICD-10-CM | POA: Diagnosis not present

## 2021-04-03 ENCOUNTER — Other Ambulatory Visit: Payer: Self-pay

## 2021-04-03 MED ORDER — FEXOFENADINE HCL 60 MG PO TABS: 60.0000 mg | ORAL_TABLET | Freq: Every day | ORAL | 1 refills | Status: DC

## 2021-04-13 ENCOUNTER — Encounter (INDEPENDENT_AMBULATORY_CARE_PROVIDER_SITE_OTHER): Payer: Self-pay | Admitting: Physician Assistant

## 2021-04-14 ENCOUNTER — Ambulatory Visit (INDEPENDENT_AMBULATORY_CARE_PROVIDER_SITE_OTHER): Payer: Medicare HMO | Admitting: Physician Assistant

## 2021-04-14 ENCOUNTER — Other Ambulatory Visit: Payer: Self-pay

## 2021-04-14 ENCOUNTER — Encounter (INDEPENDENT_AMBULATORY_CARE_PROVIDER_SITE_OTHER): Payer: Self-pay | Admitting: Physician Assistant

## 2021-04-14 VITALS — BP 112/75 | HR 59 | Temp 97.8°F | Ht 62.0 in | Wt 180.0 lb

## 2021-04-14 DIAGNOSIS — E7849 Other hyperlipidemia: Secondary | ICD-10-CM | POA: Diagnosis not present

## 2021-04-14 DIAGNOSIS — Z6839 Body mass index (BMI) 39.0-39.9, adult: Secondary | ICD-10-CM | POA: Diagnosis not present

## 2021-04-14 DIAGNOSIS — E8881 Metabolic syndrome: Secondary | ICD-10-CM | POA: Diagnosis not present

## 2021-04-14 NOTE — Progress Notes (Signed)
Chief Complaint:   OBESITY Deniqua is here to discuss her progress with her obesity treatment plan along with follow-up of her obesity related diagnoses. Kehinde is on keeping a food journal and adhering to recommended goals of 1400-1500 calories and 90 grams protein and states she is following her eating plan approximately 95% of the time. Anaiza states she is not currently exercising.  Today's visit was #: 23 Starting weight: 217 lbs Starting date: 06/01/2019 Today's weight: 180 lbs Today's date: 04/14/2021 Total lbs lost to date: 37 Total lbs lost since last in-office visit: 0  Interim History: Murrel is eating sourdough with eggs 3 days a week and Steel-cut oats with [place] yogurt on the other days for breakfast. She is eating [place] breast for lunch and a protein for snack. She indulged somewhat over Thanksgiving.  Subjective:   1. Insulin resistance Zamari's last A1c was 5.5. She is not on medication and her hunger is controlled.  2. Hyperlipidemia  Benigna sees cardiology. She started eating real butter again based on holistic provider recommendations.  Assessment/Plan:   1. Insulin resistance Gelsey will continue to work on weight loss, exercise, and decreasing simple carbohydrates to help decrease the risk of diabetes. Rhona agreed to follow-up with [name] as directed to closely monitor her progress. Continue with meal plan.  2. Hyperlipidemia  Cardiovascular risk and specific lipid/LDL goals reviewed.  We discussed several lifestyle modifications today and Danessa will continue to work on diet, exercise and weight loss efforts. Orders and follow up as documented in patient record. Decrease real butter due to elevated cholesterol.  Counseling Intensive lifestyle modifications are the first line treatment for this issue. Dietary changes: Increase soluble fiber. Decrease simple carbohydrates. Exercise changes: Moderate to vigorous-intensity aerobic activity 150 minutes per week if  tolerated. Lipid-lowering medications: see documented in medical record.   3. Obesity with current BMI of 32.91  Percy is currently in the action stage of change. As such, her goal is to continue with weight loss efforts. She has agreed to keeping a food journal and adhering to recommended goals of 1300-1400 calories and 90 grams protein.   Exercise goals:  As is  Behavioral modification strategies: meal planning and cooking strategies and planning for success.  Hanan has agreed to follow-up with our clinic in 3-4 weeks. She was informed of the importance of frequent follow-up visits to maximize her success with intensive lifestyle modifications for her multiple health conditions.   Objective:   Blood pressure 112/75, pulse (!) 59, temperature 97.8 F (36.6 C), height 5\' 2"  (1.575 m), weight 180 lb (81.6 kg), SpO2 97 %. Body mass index is 32.92 kg/m.  General: Cooperative, alert, well developed, in no acute distress. HEENT: Conjunctivae and lids unremarkable. Cardiovascular: Regular rhythm.  Lungs: Normal work of breathing. Neurologic: No focal deficits.   Lab Results  Component Value Date   CREATININE 0.71 03/03/2021   BUN 24 (H) 03/03/2021   NA 139 03/03/2021   K 4.1 03/03/2021   CL 103 03/03/2021   CO2 29 03/03/2021   Lab Results  Component Value Date   ALT 26 03/24/2021   AST 21 03/24/2021   ALKPHOS 68 03/24/2021   BILITOT 0.5 03/24/2021   Lab Results  Component Value Date   HGBA1C 5.5 12/04/2020   HGBA1C 5.5 08/08/2020   HGBA1C 5.4 05/07/2020   HGBA1C 5.4 11/14/2019   HGBA1C 5.4 06/01/2019   Lab Results  Component Value Date   INSULIN 10.4 12/04/2020   INSULIN  10.7 08/08/2020   INSULIN 8.2 05/07/2020   INSULIN 8.5 11/14/2019   INSULIN 26.1 (H) 06/01/2019   Lab Results  Component Value Date   TSH 1.48 03/03/2021   Lab Results  Component Value Date   CHOL 158 12/04/2020   HDL 31 (L) 12/04/2020   LDLCALC 99 12/04/2020   LDLDIRECT 141.0 05/02/2018    TRIG 160 (H) 12/04/2020   CHOLHDL 5.1 (H) 12/04/2020   Lab Results  Component Value Date   VD25OH 58.9 12/04/2020   VD25OH 45.2 08/08/2020   VD25OH 48.8 05/07/2020   Lab Results  Component Value Date   WBC 7.5 10/07/2020   HGB 15.4 (H) 10/07/2020   HCT 45.5 10/07/2020   MCV 86.2 10/07/2020   PLT 152 10/07/2020   Lab Results  Component Value Date   FERRITIN 111 08/12/2012    Obesity Behavioral Intervention:   Approximately 15 minutes were spent on the discussion below.  ASK: We discussed the diagnosis of obesity with [place] today and Kalya agreed to give [name] permission to discuss obesity behavioral modification therapy today.  ASSESS: Windie has the diagnosis of obesity and her BMI today is 32.9. Stephenie is in the action stage of change.   ADVISE: Brittnei was educated on the multiple health risks of obesity as well as the benefit of weight loss to improve her health. She was advised of the need for long term treatment and the importance of lifestyle modifications to improve her current health and to decrease her risk of future health problems.  AGREE: Multiple dietary modification options and treatment options were discussed and Verlin agreed to follow the recommendations documented in the above note.  ARRANGE: Davielle was educated on the importance of frequent visits to treat obesity as outlined per CMS and USPSTF guidelines and agreed to schedule her next follow up appointment today.  Attestation Statements:   Reviewed by clinician on day of visit: allergies, medications, problem list, medical history, surgical history, family history, social history, and previous encounter notes.  Coral Ceo, CMA, am acting as transcriptionist for [employer], PA-C.  I have reviewed the above documentation for accuracy and completeness, and I agree with the above. Abby Potash, PA-C

## 2021-04-16 ENCOUNTER — Encounter: Payer: Self-pay | Admitting: Adult Health

## 2021-04-16 ENCOUNTER — Other Ambulatory Visit: Payer: Self-pay

## 2021-04-16 ENCOUNTER — Telehealth (INDEPENDENT_AMBULATORY_CARE_PROVIDER_SITE_OTHER): Payer: Medicare HMO | Admitting: Adult Health

## 2021-04-16 VITALS — Ht 62.0 in | Wt 180.0 lb

## 2021-04-16 DIAGNOSIS — H9201 Otalgia, right ear: Secondary | ICD-10-CM | POA: Diagnosis not present

## 2021-04-16 DIAGNOSIS — J029 Acute pharyngitis, unspecified: Secondary | ICD-10-CM | POA: Diagnosis not present

## 2021-04-16 MED ORDER — AMOXICILLIN-POT CLAVULANATE 875-125 MG PO TABS: 1.0000 | ORAL_TABLET | Freq: Two times a day (BID) | ORAL | 0 refills | Status: DC

## 2021-04-16 NOTE — Patient Instructions (Addendum)
Pharyngitis Pharyngitis is a sore throat (pharynx). This is when there is redness, pain, and swelling in your throat. Most of the time, this condition gets better on its own. In some cases, you may need medicine. What are the causes? An infection from a virus. An infection from bacteria. Allergies. What increases the risk? Being 88-54 years old. Being in crowded environments. These include: Daycares. Schools. Dormitories. Living in a place with cold temperatures outside. Having a weakened disease-fighting (immune) system. What are the signs or symptoms? Symptoms may vary depending on the cause. Common symptoms include: Sore throat. Tiredness (fatigue). Low-grade fever. Stuffy nose. Cough. Headache. Other symptoms may include: Glands in the neck (lymph nodes) that are swollen. Skin rashes. Film on the throat or tonsils. This can be caused by an infection from bacteria. Vomiting. Red, itchy eyes. Loss of appetite. Joint pain and muscle aches. Tonsils that are temporarily bigger than usual (enlarged). How is this treated? Many times, treatment is not needed. This condition usually gets better in 3-4 days without treatment. If the infection is caused by a bacteria, you may be need to take antibiotics. Follow these instructions at home: Medicines Take over-the-counter and prescription medicines only as told by your doctor. If you were prescribed an antibiotic medicine, take it as told by your doctor. Do not stop taking the antibiotic even if you start to feel better. Use throat lozenges or sprays to soothe your throat as told by your doctor. Children can get pharyngitis. Do not give your child aspirin. Managing pain To help with pain, try: Sipping warm liquids, such as: Broth. Herbal tea. Warm water. Eating or drinking cold or frozen liquids, such as frozen ice pops. Rinsing your mouth (gargle) with a salt water mixture 3-4 times a day or as needed. To make salt water,  dissolve -1 tsp (3-6 g) of salt in 1 cup (237 mL) of warm water. Do not swallow this mixture. Sucking on hard candy or throat lozenges. Putting a cool-mist humidifier in your bedroom at night to moisten the air. Sitting in the bathroom with the door closed for 5-10 minutes while you run hot water in the shower.  General instructions  Do not smoke or use any products that contain nicotine or tobacco. If you need help quitting, ask your doctor. Rest as told by your doctor. Drink enough fluid to keep your pee (urine) pale yellow. How is this prevented? Wash your hands often for at least 20 seconds with soap and water. If soap and water are not available, use hand sanitizer. Do not touch your eyes, nose, or mouth with unwashed hands. Wash hands after touching these areas. Do not share cups or eating utensils. Avoid close contact with people who are sick. Contact a doctor if: You have large, tender lumps in your neck. You have a rash. You cough up green, yellow-brown, or bloody spit. Get help right away if: You have a stiff neck. You drool or cannot swallow liquids. You cannot drink or take medicines without vomiting. You have very bad pain that does not go away with medicine. You have problems breathing, and it is not from a stuffy nose. You have new pain and swelling in your knees, ankles, wrists, or elbows. These symptoms may be an emergency. Get help right away. Call your local emergency services (911 in the [place] Do not wait to see if the symptoms will go away. Do not drive yourself to the hospital. Summary Pharyngitis is a sore throat (pharynx). This is  when there is redness, pain, and swelling in your throat. Most of the time, pharyngitis gets better on its own. Sometimes, you may need medicine. If you were prescribed an antibiotic medicine, take it as told by your doctor. Do not stop taking the antibiotic even if you start to feel better. This information is not intended to  replace advice given to you by your health care provider. Make sure you discuss any questions you have with your health care provider. Document Revised: 07/31/2020 Document Reviewed: 07/31/2020 Elsevier Patient Education  [place], Adult An earache, or ear pain, can be caused by many things, including: An infection. Ear wax buildup. Ear pressure. Something in the ear that should not be there (foreign body). A sore throat. Tooth problems. Jaw problems. Treatment of the earache will depend on the cause. If the cause is not clear or cannot be determined, you may need to watch your symptoms until your earache goes away or until a cause is found. Follow these instructions at home: Medicines Take or apply over-the-counter and prescription medicines only as told by your health care provider. If you were prescribed an antibiotic medicine, use it as told by your health care provider. Do not stop using the antibiotic even if you start to feel better. Do not put anything in your ear other than medicine that is prescribed by your health care provider. Managing pain If directed, apply heat to the affected area as often as told by your health care provider. Use the heat source that your health care provider recommends, such as a moist heat pack or a heating pad. Place a towel between your skin and the heat source. Leave the heat on for 20-30 minutes. Remove the heat if your skin turns bright red. This is especially important if you are unable to feel pain, heat, or cold. You may have a greater risk of getting burned. If directed, put ice on the affected area as often as told by your health care provider. To do this:   Put ice in a plastic bag. Place a towel between your skin and the bag. Leave the ice on for 20 minutes, 2-3 times a day. General instructions Pay attention to any changes in your symptoms. Try resting in an upright position instead of lying down. This may help to  reduce pressure in your ear and relieve pain. Chew gum if it helps to relieve your ear pain. Treat any allergies as told by your health care provider. Drink enough fluid to keep your urine pale yellow. It is up to you to get the results of any tests that were done. Ask your health care provider, or the department that is doing the tests, when your results will be ready. Keep all follow-up visits as told by your health care provider. This is important. Contact a health care provider if: Your pain does not improve within 2 days. Your earache gets worse. You have new symptoms. You have a fever. Get help right away if you: Have a severe headache. Have a stiff neck. Have trouble swallowing. Have redness or swelling behind your ear. Have fluid or blood coming from your ear. Have hearing loss. Feel dizzy. Summary An earache, or ear pain, can be caused by many things. Treatment of the earache will depend on the cause. Follow recommendations from your health care provider to treat your ear pain. If the cause is not clear or cannot be determined, you may need to watch your symptoms until your  earache goes away or until a cause is found. Keep all follow-up visits as told by your health care provider. This is important. This information is not intended to replace advice given to you by your health care provider. Make sure you discuss any questions you have with your health care provider. Document Revised: 12/10/2018 Document Reviewed: 12/10/2018 Elsevier Patient Education  [place]. Amoxicillin; Clavulanic Acid Tablets What is this medication? AMOXICILLIN; CLAVULANIC ACID (a mox i SIL in; KLAV yoo lan ic AS id) treats infections caused by bacteria. It belongs to a group of medications called penicillin antibiotics. It will not treat colds, the flu, or infections caused by viruses. This medicine may be used for other purposes; ask your health care provider or pharmacist if you have  questions. COMMON BRAND NAME(S): Augmentin What should I tell my care team before I take this medication? They need to know if you have any of these conditions: Kidney disease Liver disease Mononucleosis Stomach or intestine problems such as colitis An unusual or allergic reaction to amoxicillin, other penicillin or cephalosporin antibiotics, clavulanic acid, other medications, foods, dyes, or preservatives Pregnant or trying to get pregnant Breast-feeding How should I use this medication? Take this medication by mouth. Take it as directed on the prescription label at the same time every day. Take it with food at the start of a meal or snack. Take all of this medication unless your care team tells you to stop it early. Keep taking it even if you think you are better. Talk to your care team about the use of this medication in children. While it may be prescribed for selected conditions, precautions do apply. Overdosage: If you think you have taken too much of this medicine contact a poison control center or emergency room at once. NOTE: This medicine is only for you. Do not share this medicine with others. What if I miss a dose? If you miss a dose, take it as soon as you can. If it is almost time for your next dose, take only that dose. Do not take double or extra doses. What may interact with this medication? Allopurinol Anticoagulants Birth control pills Methotrexate Probenecid This list may not describe all possible interactions. Give your health care provider a list of all the medicines, herbs, non-prescription drugs, or dietary supplements you use. Also tell them if you smoke, drink alcohol, or use illegal drugs. Some items may interact with your medicine. What should I watch for while using this medication? Tell your care team if your symptoms do not start to get better or if they get worse. This medication may cause serious skin reactions. They can happen weeks to months after  starting the medication. Contact your care team right away if you notice fevers or flu-like symptoms with a rash. The rash may be red or purple and then turn into blisters or peeling of the skin. Or, you might notice a red rash with swelling of the face, lips or lymph nodes in your neck or under your arms. Do not treat diarrhea with over the counter products. Contact your care team if you have diarrhea that lasts more than 2 days or if it is severe and watery. If you have diabetes, you may get a false-positive result for sugar in your urine. Check with your care team. Birth control may not work properly while you are taking this medication. Talk to your care team about using an extra method of birth control. What side effects may I  notice from receiving this medication? Side effects that you should report to your care team as soon as possible: Allergic reactions--skin rash, itching, hives, swelling of the face, lips, tongue, or throat Liver injury--right upper belly pain, loss of appetite, nausea, light-colored stool, dark yellow or brown urine, yellowing skin or eyes, unusual weakness or fatigue Redness, blistering, peeling, or loosening of the skin, including inside the mouth Severe diarrhea, fever Unusual vaginal discharge, itching, or odor Side effects that usually do not require medical attention (report to your care team if they continue or are bothersome): Diarrhea Nausea Vomiting This list may not describe all possible side effects. Call your doctor for medical advice about side effects. You may report side effects to FDA at 1-800-FDA-1088. Where should I keep my medication? Keep out of the reach of children and pets. Store at room temperature between 20 and 25 degrees C (68 and 77 degrees F). Throw away any unused medication after the expiration date. NOTE: This sheet is a summary. It may not cover all possible information. If you have questions about this medicine, talk to your doctor,  pharmacist, or health care provider.  2022 Elsevier/Gold Standard (2020-04-28 00:00:00)

## 2021-04-16 NOTE — Progress Notes (Signed)
Virtual Visit via Video Note  I connected with Brittney Ruiz on 04/16/21 at  2:00 PM EST by a video enabled telemedicine application and verified that I am speaking with the correct person using two identifiers.  Location: Patient: at home  Provider: Provider: Provider's office at  [facility], [place].      I discussed the limitations of evaluation and management by telemedicine and the availability of in person appointments. The patient expressed understanding and agreed to proceed.  History of Present Illness: Sore throat started on Sunday 04/13/21, she started taking Childrens motrin and dimetapp cold and cough and gargled with salt water. Very painful throat with swallowing. Green drainage in back of throat with a horrible sore throat she reports. Ear pain - right ear feels very sore. Able to swallow liquids, and could take pills this morning.She reports this " feels like  Covid test x 2 has been negative.  Denies any fever or chills.  Of note upon chart review patient does have multiple.She denies any new exposures or concerns for allergic reaction, she reports this is not typical for her allergic reaction or spots.. No lip swelling. No shortness of breath.  Denies any known exposure.   Patient  denies any fever, body aches,chills, rash, chest pain, shortness of breath, nausea, vomiting, or diarrhea.     Observations/Objective:    Patient is alert and oriented and responsive to questions Engages in conversation with provider. Speaks in full sentences without any pauses without any shortness of breath or distress.    Assessment and Plan:  1. Pharyngitis, unspecified etiology Meds ordered this encounter  Medications   amoxicillin-clavulanate (AUGMENTIN) 875-125 MG tablet    Sig: Take 1 tablet by mouth 2 (two) times daily.    Dispense:  20 tablet    Refill:  0  Patient advised to do salt water gargles 3 times a day with warm water.  Throat  lozenges, Chloraseptic spray okay patient is advised to go ahead and have COVID flu RSV and strep culture done in the parking lot this afternoon before starting antibiotic.  Patient was specifically requesting Augmentin and says that she has been like this in the past and has had a history of strep, patient reports that this is exactly what her strep felt like the last time she had it so we will go ahead and start antibiotics and wait on pending viral cultures.  Discussed bacterial versus viral and symptomatic management.  Patient verbalized understanding.  Also discussed signs of anaphylaxis and when to seek emergency care immediately.  Patient does have an EpiPen at home.  Orders Placed This Encounter  Procedures   COVID-19, Flu A+B and RSV   Culture, Group A Strep     Follow Up Instructions:   Red Flags discussed. The patient was given clear instructions to go to ER or return to medical center if any red flags develop, symptoms do not improve, worsen or new problems develop. They verbalized understanding. Return if symptoms worsen or fail to improve, for at any time for any worsening symptoms, Go to Emergency room/ urgent care if worse.  I discussed the assessment and treatment plan with the patient. The patient was provided an opportunity to ask questions and all were answered. The patient agreed with the plan and demonstrated an understanding of the instructions.   The patient was advised to call back or seek an in-person evaluation if the symptoms worsen or if the condition fails to improve  as anticipated.  I provided 20 minutes of non-face-to-face time during this encounter.   Marcille Buffy, FNP;

## 2021-04-17 LAB — COVID-19, FLU A+B AND RSV
Influenza A, NAA: NOT DETECTED
Influenza B, NAA: NOT DETECTED
RSV, NAA: NOT DETECTED
SARS-CoV-2, NAA: NOT DETECTED

## 2021-04-17 NOTE — Progress Notes (Signed)
Negative for flu, covid and RSV . Strep culture still pending.

## 2021-04-18 LAB — CULTURE, GROUP A STREP
MICRO NUMBER:: 12696157
SPECIMEN QUALITY:: ADEQUATE

## 2021-04-21 ENCOUNTER — Ambulatory Visit (INDEPENDENT_AMBULATORY_CARE_PROVIDER_SITE_OTHER): Payer: Medicare HMO | Admitting: Gastroenterology

## 2021-04-21 ENCOUNTER — Other Ambulatory Visit: Payer: Self-pay

## 2021-04-21 ENCOUNTER — Encounter: Payer: Self-pay | Admitting: Gastroenterology

## 2021-04-21 VITALS — BP 114/80 | HR 63 | Temp 98.9°F | Ht 62.0 in | Wt 182.0 lb

## 2021-04-21 DIAGNOSIS — K760 Fatty (change of) liver, not elsewhere classified: Secondary | ICD-10-CM

## 2021-04-21 NOTE — Progress Notes (Signed)
Brittney Antigua, MD [address]  [place]  [place], [place] [address]  Main: [phone]  Fax: [phone]   Primary Care Physician: Burnard Hawthorne, FNP   Chief complaint: Fatty liver  HPI: Brittney Ruiz is a 55 y.o. female who was referred for elevated liver enzymes and patient states that fatty liver was found on imaging.  Patient states she is working with a nutritionist for weight loss and this has helped.  No dysphagia.  No nausea or vomiting.  Good appetite.  No altered bowel habits or blood in stool.  Was previously seen for constipation in 2019 and Linzess was discussed but patient did not want pharmacologic therapy for constipation.  Please see previous notes   ROS: All ROS reviewed and negative except as per HPI   Past Medical History:  Diagnosis Date   Abnormal Pap smear of cervix    h/o LEEP   Allergy    Angio-edema    Anxiety    Asthma    Bladder leak    Cancer ([facility])    melanoma 2003   Chest pain    Constipation    COPD (chronic obstructive pulmonary disease) (HCC)    Depression    Diarrhea    Food allergy    Heart valve problem    Heartburn    History of swelling of feet    Hyperlipidemia    Hypertension    IBS (irritable bowel syndrome)    Joint pain    Low back pain    Melanoma ([facility])    right foot s/p lymph node removed right groin   Obesity (BMI 30-39.9)    Palpitation    Prediabetes    Shortness of breath    Urticaria    Vaginal delivery    X 2    Past Surgical History:  Procedure Laterality Date   BLADDER SUSPENSION  07/2017   COLONOSCOPY WITH PROPOFOL N/A 07/01/2017   Procedure: COLONOSCOPY WITH PROPOFOL;  Surgeon: Virgel Manifold, MD;  Location: [facility] ENDOSCOPY;  Service: Endoscopy;  Laterality: N/A;   ESOPHAGOGASTRODUODENOSCOPY (EGD) WITH PROPOFOL N/A 07/01/2017   Procedure: ESOPHAGOGASTRODUODENOSCOPY (EGD) WITH PROPOFOL;  Surgeon: Virgel Manifold, MD;  Location: [facility] ENDOSCOPY;  Service: Endoscopy;   Laterality: N/A;   LEEP  1995   LEEP     PARTIAL HYSTERECTOMY  08/2017   s/p partial hysterectomy - per patient has fallopian tubes; ovaries INTACT. NO CERVIX on exam 03/06/20   SKIN CANCER EXCISION     melanoma   TONSILLECTOMY      Prior to Admission medications   Medication Sig Start Date End Date Taking? Authorizing Provider  albuterol (VENTOLIN HFA) 108 (90 Base) MCG/ACT inhaler Inhale 2 puffs into the lungs every 6 (six) hours as needed. 04/08/20  Yes Marval Regal, NP  Alcohol Swabs (DROPSAFE ALCOHOL PREP) 70 % PADS USE AS DIRECTED 12/23/20  Yes Burnard Hawthorne, FNP  amoxicillin-clavulanate (AUGMENTIN) 875-125 MG tablet Take 1 tablet by mouth 2 (two) times daily. 04/16/21  Yes Flinchum, Kelby Aline, FNP  aspirin EC 81 MG tablet Take 81 mg by mouth at bedtime.   Yes [provider]  B Complex Vitamins (BL VITAMIN B COMPLEX PO) +Folic acid qd. 83/15/17  Yes [provider]  Blood Glucose Monitoring Suppl (TRUE METRIX METER) w/Device KIT USE AS DIRECTED 03/18/21  Yes Arnett, Yvetta Coder, FNP  Calcium Carb-Cholecalciferol (CALCIUM 600/VITAMIN D PO) Take 0.5 tablets by mouth daily.   Yes [provider]  Cholecalciferol (VITAMIN D-3) 25 MCG (1000 UT) CAPS Take 1 capsule by mouth daily.   Yes [provider]  clorazepate (TRANXENE) 7.5 MG tablet Take 7.5 mg by mouth 3 (three) times daily.   Yes [provider]  fexofenadine (ALLEGRA) 60 MG tablet Take 1 tablet (60 mg total) by mouth daily. 04/03/21  Yes Arnett, Yvetta Coder, FNP  fluticasone (FLONASE) 50 MCG/ACT nasal spray USE 2 SPRAYS INTO BOTH NOSTRILS DAILY. Patient taking differently: Place 1 spray into both nostrils at bedtime. 12/08/18  Yes Burnard Hawthorne, FNP  Multiple Vitamin (MULTIVITAMIN) tablet Take 1 tablet by mouth daily.   Yes [provider]  Omega-3 Fatty Acids (FISH OIL) 1000 MG CAPS Take 1 capsule by mouth daily.   Yes [provider]  omeprazole (PRILOSEC)  20 MG capsule TAKE 1 CAPSULE EVERY DAY 02/13/21  Yes Arnett, Yvetta Coder, FNP  polyethylene glycol powder (GLYCOLAX/MIRALAX) 17 GM/SCOOP powder Take 1 Container by mouth at bedtime.   Yes [provider]  propranolol (INDERAL) 20 MG tablet Take 1 tablet (20 mg total) by mouth 3 (three) times daily. 07/16/15  Yes Jackolyn Confer, MD  psyllium (METAMUCIL) 58.6 % powder Take 1 packet by mouth daily.   Yes [provider]  sertraline (ZOLOFT) 100 MG tablet TAKE 1+1/2 TABLETS BY MOUTH DAILY AS DIRECTED Patient taking differently: Take 112.5 mg by mouth See admin instructions. 112.5 in the morning and 25 mg in the evening 06/14/14  Yes Jackolyn Confer, MD  sertraline (ZOLOFT) 25 MG tablet Take 25 mg by mouth every evening.  08/08/18  Yes [provider]  TRUE METRIX BLOOD GLUCOSE TEST test strip TEST BLOOD SUGAR ONE TIME DAILY 03/18/21  Yes Arnett, Yvetta Coder, FNP  TRUEplus Lancets 33G [place] TEST BLOOD SUGAR ONE TIME DAILY 03/18/21  Yes Burnard Hawthorne, FNP    Family History  Problem Relation Age of Onset   Hyperlipidemia Mother    Hypertension Mother    Diabetes Mother    Breast cancer Mother 26   Stroke Mother    Cancer Mother    Cancer Father        lung   Depression Father    Diabetes Father    Hypertension Father    Hyperlipidemia Father    Sleep apnea Father    Alcoholism Father    Hyperlipidemia Sister    Hypertension Sister    Migraines Sister    Hypertension Brother    Hyperlipidemia Brother    Migraines Brother    Cancer Maternal Grandmother        glioblastoma   Depression Maternal Grandfather    Heart disease Maternal Grandfather    Cancer Paternal Grandfather        unknown   AAA (abdominal aortic aneurysm) Paternal Grandfather      Social History   Tobacco Use   Smoking status: Former    Packs/day: 1.00    Years: 25.00    Pack years: 25.00    Types: Cigarettes    Quit date: 2008    Years since quitting: 14.9   Smokeless tobacco:  Never  Vaping Use   Vaping Use: Never used  Substance Use Topics   Alcohol use: No   Drug use: No    Allergies as of 04/21/2021 - Review Complete 04/21/2021  Allergen Reaction Noted   Elemental sulfur Swelling, Hives, and Other (See Comments) 09/23/1983   Pork allergy Swelling 10/11/2012   Pork-derived products Swelling 10/11/2012   Aciphex Graciella Belton  sodium]  03/10/2011   Antivert [meclizine hcl]  03/10/2011   Buspar [buspirone hcl]  03/10/2011   Cat hair extract  10/11/2012   Chlorpheniramine-pseudoeph  03/10/2011   Chlorpheniramine-pseudoeph Other (See Comments) 06/05/2016   Chocolate flavor  10/14/2012   Codeine  03/10/2011   Crestor [rosuvastatin calcium]  06/10/2017   Cymbalta [duloxetine hcl]  03/10/2011   Duloxetine Other (See Comments) 10/11/2012   Effexor xr [venlafaxine hydrochloride]  03/10/2011   Epinephrine  03/10/2011   Erythromycin  03/10/2011   Escitalopram oxalate  03/10/2011   Lamictal [lamotrigine]  03/10/2011   Levofloxacin Swelling 04/03/2011   Lipitor [atorvastatin]  08/09/2015   Meclizine Other (See Comments) 10/11/2012   Mometasone furoate  03/10/2011   Mometasone furoate  03/10/2011   Monosodium glutamate  10/11/2012   Neurontin [gabapentin]  03/10/2011   Other  10/11/2012   Prednisone  03/10/2011   Red yeast rice extract  10/18/2020   Shellfish allergy  10/11/2012   Sudafed [pseudoephedrine hcl]  03/10/2011   Sulfa antibiotics Other (See Comments) 03/10/2011   Sulfasalazine Other (See Comments) 03/10/2011   Valium  03/10/2011   Wellbutrin [bupropion hcl]  03/10/2011   Wixela inhub [fluticasone-salmeterol]  06/01/2019   Xanax xr [alprazolam]  03/27/2011   Zetia [ezetimibe]  02/29/2020   Alfalfa Rash 10/15/2012   Brompheniramine-phenylephrine Palpitations 12/05/2012   Buspirone Rash and Other (See Comments) 10/11/2012   Tape Rash 03/10/2011    Physical Examination:  Constitutional: General:   Alert,  Well-developed, well-nourished,  pleasant and cooperative in NAD BP 114/80   Pulse 63   Temp 98.9 F (37.2 C) (Oral)   Ht 5' 2"  (1.575 m)   Wt 182 lb (82.6 kg)   LMP  (LMP Unknown)   BMI 33.29 kg/m   Respiratory: Normal respiratory effort  Gastrointestinal:  Soft, non-tender and non-distended without masses, hepatosplenomegaly or hernias noted.  No guarding or rebound tenderness.     Cardiac: No clubbing or edema.  No cyanosis. Normal posterior tibial pedal pulses noted.  Psych:  Alert and cooperative. Normal mood and affect.  Musculoskeletal:  Normal gait. Head normocephalic, atraumatic. Symmetrical without gross deformities. 5/5 Lower extremity strength bilaterally.  Skin: Warm. Intact without significant lesions or rashes. No jaundice.  Neck: Supple, trachea midline  Lymph: No cervical lymphadenopathy  Psych:  Alert and oriented x3, Alert and cooperative. Normal mood and affect.  Labs: CMP     Component Value Date/Time   NA 139 03/03/2021 1412   NA 145 (H) 12/04/2020 1245   NA 136 10/06/2012 0434   K 4.1 03/03/2021 1412   K 3.6 10/06/2012 0434   CL 103 03/03/2021 1412   CL 104 10/06/2012 0434   CO2 29 03/03/2021 1412   CO2 26 10/06/2012 0434   GLUCOSE 92 03/03/2021 1412   GLUCOSE 114 (H) 10/06/2012 0434   BUN 24 (H) 03/03/2021 1412   BUN 26 (H) 12/04/2020 1245   BUN 7 10/06/2012 0434   CREATININE 0.71 03/03/2021 1412   CREATININE 0.73 08/09/2015 1609   CALCIUM 9.1 03/03/2021 1412   CALCIUM 9.3 10/06/2012 0434   PROT 6.6 03/24/2021 1502   PROT 6.7 12/04/2020 1245   PROT 7.3 10/06/2012 0434   ALBUMIN 4.5 03/24/2021 1502   ALBUMIN 4.7 12/04/2020 1245   ALBUMIN 4.2 10/06/2012 0434   AST 21 03/24/2021 1502   AST 17 10/06/2012 0434   ALT 26 03/24/2021 1502   ALT 27 10/06/2012 0434   ALKPHOS 68 03/24/2021 1502   ALKPHOS 64 10/06/2012 0434  BILITOT 0.5 03/24/2021 1502   BILITOT 0.5 12/04/2020 1245   BILITOT 0.6 10/06/2012 0434   GFRNONAA >60 10/07/2020 1506   GFRNONAA >60 10/06/2012  0434   GFRAA 107 05/07/2020 1302   GFRAA >60 10/06/2012 0434   Lab Results  Component Value Date   WBC 7.5 10/07/2020   HGB 15.4 (H) 10/07/2020   HCT 45.5 10/07/2020   MCV 86.2 10/07/2020   PLT 152 10/07/2020    Imaging Studies:   Assessment and Plan:   Brittney Ruiz is a 55 y.o. y/o female referred for elevated liver enzymes  Most recent liver enzymes are completely normal However, patient has had chronic intermittent elevation in ALT and CT scan showed hepatic steatosis.  Most likely cause of elevated ALT is fatty liver  Will obtain fatty liver work-up to rule out any other causes.  Hep B serology was done by PCP in 2018 and hepatitis B vaccination was recommended at the time as patient was not immune.  I do not see that this was given under her immunization records.  We will recheck immunity status  Finding of fatty liver on imaging discussed with patient Diet, weight loss, and exercise encouraged along with avoiding hepatotoxic drugs including alcohol Risk of progression to cirrhosis if above measures are not instituted were discussed as well, and patient verbalized understanding  Continue high-fiber diet for patient's constipation as this is helping her and she has previously refused pharmacologic therapy.  However, if constipation worsens constipation other treatments can be discussed  Dr Brittney Ruiz

## 2021-04-22 DIAGNOSIS — Z0100 Encounter for examination of eyes and vision without abnormal findings: Secondary | ICD-10-CM | POA: Diagnosis not present

## 2021-04-22 DIAGNOSIS — H5213 Myopia, bilateral: Secondary | ICD-10-CM | POA: Diagnosis not present

## 2021-04-22 LAB — IRON AND TIBC
Iron Saturation: 27 % (ref 15–55)
Iron: 77 ug/dL (ref 27–159)
Total Iron Binding Capacity: 283 ug/dL (ref 250–450)
UIBC: 206 ug/dL (ref 131–425)

## 2021-04-24 LAB — MITOCHONDRIAL/SMOOTH MUSCLE AB PNL
Mitochondrial Ab: <20 Units (ref 0.0–20.0)
Smooth Muscle Ab: 6 Units (ref 0–19)

## 2021-04-24 LAB — IGG: IgG (Immunoglobin G), Serum: 713 mg/dL (ref 586–1602)

## 2021-04-24 LAB — HEPATITIS A ANTIBODY, TOTAL: hep A Total Ab: NEGATIVE

## 2021-04-24 LAB — ANTI-MICROSOMAL ANTIBODY LIVER / KIDNEY: LKM1 Ab: 1.7 Units (ref 0.0–20.0)

## 2021-04-24 LAB — HEPATITIS C ANTIBODY: Hep C Virus Ab: 0.1 s/co ratio (ref 0.0–0.9)

## 2021-04-24 LAB — ANA: ANA Titer 1: NEGATIVE

## 2021-04-24 LAB — HEPATITIS B SURFACE ANTIGEN: Hepatitis B Surface Ag: NEGATIVE

## 2021-04-24 LAB — HEPATITIS B SURFACE ANTIBODY,QUALITATIVE: Hep B Surface Ab, Qual: NONREACTIVE

## 2021-04-24 LAB — HEPATITIS B CORE ANTIBODY, TOTAL: Hep B Core Total Ab: NEGATIVE

## 2021-04-24 LAB — CERULOPLASMIN: Ceruloplasmin: 28 mg/dL (ref 19.0–39.0)

## 2021-04-24 LAB — FERRITIN: Ferritin: 336 ng/mL — ABNORMAL HIGH (ref 15–150)

## 2021-05-06 ENCOUNTER — Ambulatory Visit (INDEPENDENT_AMBULATORY_CARE_PROVIDER_SITE_OTHER): Payer: Medicare HMO | Admitting: Physician Assistant

## 2021-05-06 ENCOUNTER — Encounter (INDEPENDENT_AMBULATORY_CARE_PROVIDER_SITE_OTHER): Payer: Self-pay | Admitting: Physician Assistant

## 2021-05-06 ENCOUNTER — Other Ambulatory Visit: Payer: Self-pay

## 2021-05-06 VITALS — BP 117/83 | HR 60 | Temp 97.7°F | Ht 62.0 in | Wt 182.0 lb

## 2021-05-06 DIAGNOSIS — K760 Fatty (change of) liver, not elsewhere classified: Secondary | ICD-10-CM

## 2021-05-06 DIAGNOSIS — Z6839 Body mass index (BMI) 39.0-39.9, adult: Secondary | ICD-10-CM | POA: Diagnosis not present

## 2021-05-06 NOTE — Progress Notes (Signed)
Chief Complaint:   OBESITY Brittney Ruiz is here to discuss her progress with her obesity treatment plan along with follow-up of her obesity related diagnoses. Brittney Ruiz is on the Category 3 Plan and keeping a food journal and adhering to recommended goals of 1300-1400 calories and 90 grams protein and states she is following her eating plan approximately 60% of the time. Brittney Ruiz states she is not currently exercising.  Today's visit was #: 24 Starting weight: 217 lbs Starting date: 06/01/2019 Today's weight: 182 lbs Today's date: 05/06/2021 Total lbs lost to date: 35 Total lbs lost since last in-office visit: 0  Interim History: Brittney Ruiz reports splurging over the last few weeks during celebratory occasions. She is making sure to eat protein if she feels like she is getting too many carbs.  Subjective:   1. Fatty liver Brittney Ruiz was recently diagnosed through ultrasound by GI provider. Her most recent liver enzymes were normal.  Assessment/Plan:   1. Fatty liver No alcohol. Continue weight loss and follow up with GI.  2. Obesity with current BMI of 32.91  Brittney Ruiz is currently in the action stage of change. As such, her goal is to continue with weight loss efforts. She has agreed to the Category 3 Plan and keeping a food journal and adhering to recommended goals of 450-550 calories and 40 protein with supper.   Exercise goals:  As is  Behavioral modification strategies: keeping healthy foods in the home and planning for success.  Brittney Ruiz has agreed to follow-up with our clinic in 4 weeks. She was informed of the importance of frequent follow-up visits to maximize her success with intensive lifestyle modifications for her multiple health conditions.   Objective:   Blood pressure 117/83, pulse 60, temperature 97.7 F (36.5 C), height 5\' 2"  (1.575 m), weight 182 lb (82.6 kg), SpO2 97 %. Body mass index is 33.29 kg/m.  General: Cooperative, alert, well developed, in no acute distress. HEENT:  Conjunctivae and lids unremarkable. Cardiovascular: Regular rhythm.  Lungs: Normal work of breathing. Neurologic: No focal deficits.   Lab Results  Component Value Date   CREATININE 0.71 03/03/2021   BUN 24 (H) 03/03/2021   NA 139 03/03/2021   K 4.1 03/03/2021   CL 103 03/03/2021   CO2 29 03/03/2021   Lab Results  Component Value Date   ALT 26 03/24/2021   AST 21 03/24/2021   ALKPHOS 68 03/24/2021   BILITOT 0.5 03/24/2021   Lab Results  Component Value Date   HGBA1C 5.5 12/04/2020   HGBA1C 5.5 08/08/2020   HGBA1C 5.4 05/07/2020   HGBA1C 5.4 11/14/2019   HGBA1C 5.4 06/01/2019   Lab Results  Component Value Date   INSULIN 10.4 12/04/2020   INSULIN 10.7 08/08/2020   INSULIN 8.2 05/07/2020   INSULIN 8.5 11/14/2019   INSULIN 26.1 (H) 06/01/2019   Lab Results  Component Value Date   TSH 1.48 03/03/2021   Lab Results  Component Value Date   CHOL 158 12/04/2020   HDL 31 (L) 12/04/2020   LDLCALC 99 12/04/2020   LDLDIRECT 141.0 05/02/2018   TRIG 160 (H) 12/04/2020   CHOLHDL 5.1 (H) 12/04/2020   Lab Results  Component Value Date   VD25OH 58.9 12/04/2020   VD25OH 45.2 08/08/2020   VD25OH 48.8 05/07/2020   Lab Results  Component Value Date   WBC 7.5 10/07/2020   HGB 15.4 (H) 10/07/2020   HCT 45.5 10/07/2020   MCV 86.2 10/07/2020   PLT 152 10/07/2020   Lab Results  Component Value Date   IRON 77 04/21/2021   TIBC 283 04/21/2021   FERRITIN 336 (H) 04/21/2021    Obesity Behavioral Intervention:   Approximately 15 minutes were spent on the discussion below.  ASK: We discussed the diagnosis of obesity with Brittney Ruiz today and Brittney Ruiz agreed to give [name] permission to discuss obesity behavioral modification therapy today.  ASSESS: Brittney Ruiz has the diagnosis of obesity and her BMI today is 33.4. Brittney Ruiz is in the action stage of change.   ADVISE: Brittney Ruiz was educated on the multiple health risks of obesity as well as the benefit of weight loss to improve her health. She was  advised of the need for long term treatment and the importance of lifestyle modifications to improve her current health and to decrease her risk of future health problems.  AGREE: Multiple dietary modification options and treatment options were discussed and Brittney Ruiz agreed to follow the recommendations documented in the above note.  ARRANGE: Brittney Ruiz was educated on the importance of frequent visits to treat obesity as outlined per CMS and USPSTF guidelines and agreed to schedule her next follow up appointment today.  Attestation Statements:   Reviewed by clinician on day of visit: allergies, medications, problem list, medical history, surgical history, family history, social history, and previous encounter notes.  Brittney Ruiz, CMA, am acting as transcriptionist for [employer], PA-C.  I have reviewed the above documentation for accuracy and completeness, and I agree with the above. Brittney Potash, PA-C

## 2021-05-07 ENCOUNTER — Encounter: Payer: Self-pay | Admitting: Family

## 2021-05-07 ENCOUNTER — Ambulatory Visit
Admission: RE | Admit: 2021-05-07 | Discharge: 2021-05-07 | Disposition: A | Discharge status: Home / Self Care | Payer: Medicare HMO | Source: Ambulatory Visit | Attending: Family | Admitting: Family

## 2021-05-07 DIAGNOSIS — Z1231 Encounter for screening mammogram for malignant neoplasm of breast: Secondary | ICD-10-CM | POA: Insufficient documentation

## 2021-05-08 ENCOUNTER — Other Ambulatory Visit: Payer: Self-pay

## 2021-05-08 DIAGNOSIS — F39 Unspecified mood [affective] disorder: Secondary | ICD-10-CM | POA: Diagnosis not present

## 2021-05-08 DIAGNOSIS — F4001 Agoraphobia with panic disorder: Secondary | ICD-10-CM | POA: Diagnosis not present

## 2021-05-08 DIAGNOSIS — F410 Panic disorder [episodic paroxysmal anxiety] without agoraphobia: Secondary | ICD-10-CM | POA: Diagnosis not present

## 2021-05-09 ENCOUNTER — Other Ambulatory Visit: Payer: Self-pay

## 2021-05-16 ENCOUNTER — Other Ambulatory Visit: Payer: Self-pay | Admitting: Family

## 2021-05-16 DIAGNOSIS — Z87891 Personal history of nicotine dependence: Secondary | ICD-10-CM

## 2021-05-27 ENCOUNTER — Other Ambulatory Visit: Payer: Self-pay

## 2021-05-27 ENCOUNTER — Ambulatory Visit: Payer: Medicare HMO | Attending: Internal Medicine

## 2021-05-27 DIAGNOSIS — Z23 Encounter for immunization: Secondary | ICD-10-CM

## 2021-05-27 MED ORDER — PFIZER COVID-19 VAC BIVALENT 30 MCG/0.3ML IM SUSP
INTRAMUSCULAR | 0 refills | Status: DC
  Filled 2021-05-27: qty 0.3, 1d supply, fill #0

## 2021-05-27 NOTE — Progress Notes (Signed)
° °  Covid-19 Vaccination Clinic  Name:  Brittney Ruiz    MRN: [id number] DOB: 04-Jan-1966  05/27/2021  [name] was observed post Covid-19 immunization for 15 minutes without incident. She was provided with Vaccine Information Sheet and instruction to access the V-Safe system.   [name] was instructed to call 911 with any severe reactions post vaccine: Difficulty breathing  Swelling of face and throat  A fast heartbeat  A bad rash all over body  Dizziness and weakness   Immunizations Administered     Name Date Dose VIS Date Route   Pfizer Covid-19 Vaccine Bivalent Booster 05/27/2021  2:49 PM 0.3 mL 01/15/2021 Intramuscular   Manufacturer: [place]   Lot: [id number]   [place]: [phone]

## 2021-06-03 ENCOUNTER — Telehealth: Payer: Self-pay | Admitting: Family

## 2021-06-03 ENCOUNTER — Ambulatory Visit (INDEPENDENT_AMBULATORY_CARE_PROVIDER_SITE_OTHER): Payer: Medicare HMO | Admitting: Physician Assistant

## 2021-06-03 ENCOUNTER — Other Ambulatory Visit: Payer: Self-pay

## 2021-06-03 DIAGNOSIS — J209 Acute bronchitis, unspecified: Secondary | ICD-10-CM

## 2021-06-03 MED ORDER — DROPSAFE ALCOHOL PREP 70 % PADS: 1.0000 "application " | MEDICATED_PAD | 1 refills | Status: DC

## 2021-06-03 MED ORDER — TRUEPLUS LANCETS 33G MISC: 0 refills | Status: DC

## 2021-06-03 MED ORDER — FLUTICASONE PROPIONATE 50 MCG/ACT NA SUSP: 1.0000 | Freq: Every day | NASAL | 2 refills | Status: DC

## 2021-06-03 MED ORDER — ALBUTEROL SULFATE HFA 108 (90 BASE) MCG/ACT IN AERS: 2.0000 | INHALATION_SPRAY | Freq: Four times a day (QID) | RESPIRATORY_TRACT | 3 refills | Status: DC | PRN

## 2021-06-03 MED ORDER — OMEPRAZOLE 20 MG PO CPDR: 20.0000 mg | DELAYED_RELEASE_CAPSULE | Freq: Every day | ORAL | 1 refills | Status: DC

## 2021-06-03 MED ORDER — FEXOFENADINE HCL 60 MG PO TABS
60.0000 mg | ORAL_TABLET | Freq: Every day | ORAL | 1 refills | Status: AC
Start: 2021-06-03 — End: ?

## 2021-06-03 MED ORDER — TRUE METRIX BLOOD GLUCOSE TEST VI STRP: ORAL_STRIP | 0 refills | Status: DC

## 2021-06-03 MED ORDER — TRUE METRIX METER W/DEVICE KIT: PACK | 0 refills | Status: DC

## 2021-06-03 NOTE — Telephone Encounter (Signed)
Pt called in regards to insurance change for her and husband Onalee Steinbach. Due to her insurance change she is now going to receive all of her medications through. Pt would like a new order for prescriptions sent in to [facility]. Please fax a new prescription for medications:   omeprazole (PRILOSEC) 20 MG capsule fexofenadine (ALLEGRA) 60 MG tablet fluticasone (FLONASE) 50 MCG/ACT nasal spray albuterol (VENTOLIN HFA) 108 (90 Base) MCG/ACT inhaler Alcohol Swabs (DROPSAFE ALCOHOL PREP) 70 % PADS Blood Glucose Monitoring Suppl (TRUE METRIX METER) w/Device KIT TRUE METRIX BLOOD GLUCOSE TEST test strip TRUEplus Lancets 33G MISC   Fax number: [phone]

## 2021-06-03 NOTE — Telephone Encounter (Signed)
LM that prescriptions were sent & asked she call back if questions.

## 2021-06-04 ENCOUNTER — Ambulatory Visit: Payer: Medicare HMO

## 2021-06-06 ENCOUNTER — Other Ambulatory Visit: Payer: Self-pay

## 2021-06-07 ENCOUNTER — Other Ambulatory Visit: Payer: Self-pay | Admitting: Family

## 2021-06-07 DIAGNOSIS — J209 Acute bronchitis, unspecified: Secondary | ICD-10-CM

## 2021-06-09 ENCOUNTER — Encounter (INDEPENDENT_AMBULATORY_CARE_PROVIDER_SITE_OTHER): Payer: Self-pay | Admitting: Family Medicine

## 2021-06-09 ENCOUNTER — Ambulatory Visit (INDEPENDENT_AMBULATORY_CARE_PROVIDER_SITE_OTHER): Payer: Medicare HMO | Admitting: Family Medicine

## 2021-06-09 ENCOUNTER — Other Ambulatory Visit: Payer: Self-pay

## 2021-06-09 VITALS — BP 119/81 | HR 52 | Temp 97.5°F | Ht 62.0 in | Wt 184.0 lb

## 2021-06-09 DIAGNOSIS — E559 Vitamin D deficiency, unspecified: Secondary | ICD-10-CM

## 2021-06-09 DIAGNOSIS — E7849 Other hyperlipidemia: Secondary | ICD-10-CM | POA: Diagnosis not present

## 2021-06-09 DIAGNOSIS — R7303 Prediabetes: Secondary | ICD-10-CM | POA: Diagnosis not present

## 2021-06-09 DIAGNOSIS — E669 Obesity, unspecified: Secondary | ICD-10-CM

## 2021-06-09 DIAGNOSIS — Z6833 Body mass index (BMI) 33.0-33.9, adult: Secondary | ICD-10-CM

## 2021-06-09 MED ORDER — HEPLISAV-B 20 MCG/0.5ML IM SOSY
PREFILLED_SYRINGE | INTRAMUSCULAR | 0 refills | Status: DC
  Filled 2021-06-09 (×2): qty 0.5, 1d supply, fill #0

## 2021-06-10 ENCOUNTER — Telehealth: Payer: Self-pay | Admitting: Family Medicine

## 2021-06-10 DIAGNOSIS — Z7185 Encounter for immunization safety counseling: Secondary | ICD-10-CM

## 2021-06-10 NOTE — Telephone Encounter (Signed)
Hep A & B questions/concerns.

## 2021-06-10 NOTE — Progress Notes (Signed)
Chief Complaint:   OBESITY Brittney Ruiz is here to discuss her progress with her obesity treatment plan along with follow-up of her obesity related diagnoses. Brittney Ruiz is on the Category 3 Plan and keeping a food journal and adhering to recommended goals of 450-550 calories and 40 grams protein with supper and states she is following her eating plan approximately 50% of the time. Brittney Ruiz states she is not currently exercising.  Today's visit was #: 25 Starting weight: 217 lbs Starting date: 06/01/2019 Today's weight: 184 lbs Today's date: 06/09/2021 Total lbs lost to date: 33 Total lbs lost since last in-office visit: +2  Interim History: Pt recently traveled to [place] and [facility]. She did a lot of walking and tolerated it well.  Subjective:   1. Prediabetes Reviewed with pt. Pt denies cravings currently but hasn't been eating well.  2. Other hyperlipidemia Pt seen by Dr. Debara Pickett of cardiology for HLD. She is unable to tolerate several meds- Zetia, Repatha, statins.  3. Vitamin D deficiency Pt is unable to tolerate Ergocalciferol. She takes OTC 1000 IU QD.  Assessment/Plan:   Orders Placed This Encounter  Procedures   VITAMIN D 25 Hydroxy (Vit-D Deficiency, Fractures)   Lipid panel   Hemoglobin A1c   Insulin, random   Insulin, random    Medications Discontinued During This Encounter  Medication Reason   amoxicillin-clavulanate (AUGMENTIN) 875-125 MG tablet    COVID-19 mRNA bivalent vaccine, Pfizer, (PFIZER COVID-19 VAC BIVALENT) injection    hepatitis b vaccine (HEPLISAV-B) injection      No orders of the defined types were placed in this encounter.    1. Prediabetes Focus on prudent nutritional plan and weight loss. I recommend pt increase walking since she is able to. Check labs today.  - Hemoglobin A1c - Insulin, random  2. Other hyperlipidemia No need to check FLP since pt is unable to tolerate any meds started by cardiology, but she would like to know levels. Increase  exercise and prudent nutritional plan. Check labs today.  - Lipid panel  3. Vitamin D deficiency Low Vitamin D level contributes to fatigue and are associated with obesity, breast, and colon cancer. She agrees to continue to take OTC Vitamin D 1,000 IU QD and will follow-up for routine testing of Vitamin D, at least 2-3 times per year to avoid over-replacement. Check labs today.  - VITAMIN D 25 Hydroxy (Vit-D Deficiency, Fractures)  4. Obesity with current BMI of 33.7  Brittney Ruiz is currently in the action stage of change. As such, her goal is to continue with weight loss efforts. She has agreed to the Category 3 Plan and keeping a food journal and adhering to recommended goals of 450-550 calories and 40 grams protein with supper.   Exercise goals:  Start walking.  Behavioral modification strategies: decreasing eating out and avoiding temptations.  Brittney Ruiz has agreed to follow-up with our clinic in 3-4 weeks. She was informed of the importance of frequent follow-up visits to maximize her success with intensive lifestyle modifications for her multiple health conditions.   Brittney Ruiz was informed we would discuss her lab results at her next visit unless there is a critical issue that needs to be addressed sooner. Brittney Ruiz agreed to keep her next visit at the agreed upon time to discuss these results.  Objective:   Blood pressure 119/81, pulse (!) 52, temperature (!) 97.5 F (36.4 C), height 5\' 2"  (1.575 m), weight 184 lb (83.5 kg), SpO2 99 %. Body mass index is 33.65 kg/m.  General: Cooperative, alert, well developed, in no acute distress. HEENT: Conjunctivae and lids unremarkable. Cardiovascular: Regular rhythm.  Lungs: Normal work of breathing. Neurologic: No focal deficits.   Lab Results  Component Value Date   CREATININE 0.71 03/03/2021   BUN 24 (H) 03/03/2021   NA 139 03/03/2021   K 4.1 03/03/2021   CL 103 03/03/2021   CO2 29 03/03/2021   Lab Results  Component Value Date   ALT 26  03/24/2021   AST 21 03/24/2021   ALKPHOS 68 03/24/2021   BILITOT 0.5 03/24/2021   Lab Results  Component Value Date   HGBA1C 5.5 12/04/2020   HGBA1C 5.5 08/08/2020   HGBA1C 5.4 05/07/2020   HGBA1C 5.4 11/14/2019   HGBA1C 5.4 06/01/2019   Lab Results  Component Value Date   INSULIN 10.4 12/04/2020   INSULIN 10.7 08/08/2020   INSULIN 8.2 05/07/2020   INSULIN 8.5 11/14/2019   INSULIN 26.1 (H) 06/01/2019   Lab Results  Component Value Date   TSH 1.48 03/03/2021   Lab Results  Component Value Date   CHOL 158 12/04/2020   HDL 31 (L) 12/04/2020   LDLCALC 99 12/04/2020   LDLDIRECT 141.0 05/02/2018   TRIG 160 (H) 12/04/2020   CHOLHDL 5.1 (H) 12/04/2020   Lab Results  Component Value Date   VD25OH 58.9 12/04/2020   VD25OH 45.2 08/08/2020   VD25OH 48.8 05/07/2020   Lab Results  Component Value Date   WBC 7.5 10/07/2020   HGB 15.4 (H) 10/07/2020   HCT 45.5 10/07/2020   MCV 86.2 10/07/2020   PLT 152 10/07/2020   Lab Results  Component Value Date   IRON 77 04/21/2021   TIBC 283 04/21/2021   FERRITIN 336 (H) 04/21/2021    Obesity Behavioral Intervention:   Approximately 15 minutes were spent on the discussion below.  ASK: We discussed the diagnosis of obesity with Brittney Ruiz today and Brittney Ruiz agreed to give [name] permission to discuss obesity behavioral modification therapy today.  ASSESS: Brittney Ruiz has the diagnosis of obesity and her BMI today is 33.7. Brittney Ruiz is in the action stage of change.   ADVISE: Brittney Ruiz was educated on the multiple health risks of obesity as well as the benefit of weight loss to improve her health. She was advised of the need for long term treatment and the importance of lifestyle modifications to improve her current health and to decrease her risk of future health problems.  AGREE: Multiple dietary modification options and treatment options were discussed and Brittney Ruiz agreed to follow the recommendations documented in the above note.  ARRANGE: Brittney Ruiz was  educated on the importance of frequent visits to treat obesity as outlined per CMS and USPSTF guidelines and agreed to schedule her next follow up appointment today.  Attestation Statements:   Reviewed by clinician on day of visit: allergies, medications, problem list, medical history, surgical history, family history, social history, and previous encounter notes.  Coral Ceo, CMA, am acting as transcriptionist for [employer], DO.  I have reviewed the above documentation for accuracy and completeness, and I agree with the above. Marjory Sneddon, D.O.  The [place] was signed into law in 2016 which includes the topic of electronic health records.  This provides immediate access to information in MyChart.  This includes consultation notes, operative notes, office notes, lab results and pathology reports.  If you have any questions about what you read please let us know at your next visit so we can discuss  your concerns and take corrective action if need be.  We are right here with you.

## 2021-06-11 LAB — LIPID PANEL
Chol/HDL Ratio: 7.4 ratio — ABNORMAL HIGH (ref 0.0–4.4)
Cholesterol, Total: 237 mg/dL — ABNORMAL HIGH (ref 100–199)
HDL: 32 mg/dL — ABNORMAL LOW (ref >39)
LDL Chol Calc (NIH): 182 mg/dL — ABNORMAL HIGH (ref 0–99)
Triglycerides: 124 mg/dL (ref 0–149)
VLDL Cholesterol Cal: 23 mg/dL (ref 5–40)

## 2021-06-11 LAB — HEMOGLOBIN A1C
Est. average glucose Bld gHb Est-mCnc: 105 mg/dL
Hgb A1c MFr Bld: 5.3 % (ref 4.8–5.6)

## 2021-06-11 LAB — VITAMIN D 25 HYDROXY (VIT D DEFICIENCY, FRACTURES): Vit D, 25-Hydroxy: 48.7 ng/mL (ref 30.0–100.0)

## 2021-06-11 LAB — INSULIN, RANDOM: INSULIN: 8.2 u[IU]/mL (ref 2.6–24.9)

## 2021-06-11 NOTE — Telephone Encounter (Signed)
Phone call to pt who explains she had Hep A and Hep B titer which shows no immunity and her doctor recommends Hep A and B vaccine. Pt concerned about cost as she and husband are on disability. RN discussed that [facility] would file her insurance and she would be responsible for balance. Pt plans to contact insurance company about vaccine coverage and will schedule vaccine appt. Josie Saunders, RN

## 2021-06-18 ENCOUNTER — Ambulatory Visit: Payer: Medicare HMO | Admitting: Dermatology

## 2021-07-03 ENCOUNTER — Other Ambulatory Visit: Payer: Self-pay | Admitting: *Deleted

## 2021-07-03 DIAGNOSIS — Z87891 Personal history of nicotine dependence: Secondary | ICD-10-CM

## 2021-07-07 ENCOUNTER — Ambulatory Visit (INDEPENDENT_AMBULATORY_CARE_PROVIDER_SITE_OTHER): Payer: Medicare HMO | Admitting: Physician Assistant

## 2021-07-09 ENCOUNTER — Ambulatory Visit (INDEPENDENT_AMBULATORY_CARE_PROVIDER_SITE_OTHER): Payer: Medicare HMO | Admitting: Physician Assistant

## 2021-07-09 ENCOUNTER — Other Ambulatory Visit: Payer: Self-pay

## 2021-07-09 ENCOUNTER — Encounter (INDEPENDENT_AMBULATORY_CARE_PROVIDER_SITE_OTHER): Payer: Self-pay | Admitting: Physician Assistant

## 2021-07-09 VITALS — BP 109/72 | HR 59 | Temp 97.6°F | Ht 62.0 in | Wt 187.0 lb

## 2021-07-09 DIAGNOSIS — E7849 Other hyperlipidemia: Secondary | ICD-10-CM

## 2021-07-09 DIAGNOSIS — Z6839 Body mass index (BMI) 39.0-39.9, adult: Secondary | ICD-10-CM

## 2021-07-09 DIAGNOSIS — Z6834 Body mass index (BMI) 34.0-34.9, adult: Secondary | ICD-10-CM

## 2021-07-09 DIAGNOSIS — E669 Obesity, unspecified: Secondary | ICD-10-CM

## 2021-07-09 NOTE — Progress Notes (Signed)
Chief Complaint:   OBESITY Brittney Ruiz is here to discuss her progress with her obesity treatment plan along with follow-up of her obesity related diagnoses. Brittney Ruiz is on the Category 3 Plan and keeping a food journal and adhering to recommended goals of 450-550 calories and 40 grams protein with supper and states she is following her eating plan approximately 70% of the time. Brittney Ruiz states she is stretching 15-20 minutes 3 times per week.  Today's visit was #: 27 Starting weight: 217 lbs Starting date: 06/01/2019 Today's weight: 187 lbs Today's date: 07/09/2021 Total lbs lost to date: 30 Total lbs lost since last in-office visit: 0  Interim History: Brittney Ruiz feels that she is retaining fluid today and that her hands are swollen. She has been out of town more and eating out more often. She thinks that she is not eating enough. Pt has been skipping meals, especially lunch.  Subjective:   1. Other hyperlipidemia Brittney Ruiz is seeing a lipid specialist. Her last lipid panel was not at goal.  Assessment/Plan:   1. Other hyperlipidemia Cardiovascular risk and specific lipid/LDL goals reviewed.  We discussed several lifestyle modifications today and Brittney Ruiz will continue to work on diet, exercise and weight loss efforts. Orders and follow up as documented in patient record. F/u with lipid specialist.  Counseling Intensive lifestyle modifications are the first line treatment for this issue. Dietary changes: Increase soluble fiber. Decrease simple carbohydrates. Exercise changes: Moderate to vigorous-intensity aerobic activity 150 minutes per week if tolerated. Lipid-lowering medications: see documented in medical record.  2. Obesity with current BMI of 34.19 Brittney Ruiz is currently in the action stage of change. As such, her goal is to continue with weight loss efforts. She has agreed to the Category 3 Plan.   Exercise goals:  As is  Behavioral modification strategies: decreasing eating out.  Brittney Ruiz has agreed  to follow-up with our clinic in 4 weeks. She was informed of the importance of frequent follow-up visits to maximize her success with intensive lifestyle modifications for her multiple health conditions.   Objective:   Blood pressure 109/72, pulse (!) 59, temperature 97.6 F (36.4 C), height 5\' 2"  (1.575 m), weight 187 lb (84.8 kg), SpO2 97 %. Body mass index is 34.2 kg/m.  General: Cooperative, alert, well developed, in no acute distress. HEENT: Conjunctivae and lids unremarkable. Cardiovascular: Regular rhythm.  Lungs: Normal work of breathing. Neurologic: No focal deficits.   Lab Results  Component Value Date   CREATININE 0.71 03/03/2021   BUN 24 (H) 03/03/2021   NA 139 03/03/2021   K 4.1 03/03/2021   CL 103 03/03/2021   CO2 29 03/03/2021   Lab Results  Component Value Date   ALT 26 03/24/2021   AST 21 03/24/2021   ALKPHOS 68 03/24/2021   BILITOT 0.5 03/24/2021   Lab Results  Component Value Date   HGBA1C 5.3 06/09/2021   HGBA1C 5.5 12/04/2020   HGBA1C 5.5 08/08/2020   HGBA1C 5.4 05/07/2020   HGBA1C 5.4 11/14/2019   Lab Results  Component Value Date   INSULIN 8.2 06/09/2021   INSULIN 10.4 12/04/2020   INSULIN 10.7 08/08/2020   INSULIN 8.2 05/07/2020   INSULIN 8.5 11/14/2019   Lab Results  Component Value Date   TSH 1.48 03/03/2021   Lab Results  Component Value Date   CHOL 237 (H) 06/09/2021   HDL 32 (L) 06/09/2021   LDLCALC 182 (H) 06/09/2021   LDLDIRECT 141.0 05/02/2018   TRIG 124 06/09/2021   CHOLHDL 7.4 (  H) 06/09/2021   Lab Results  Component Value Date   VD25OH 48.7 06/09/2021   VD25OH 58.9 12/04/2020   VD25OH 45.2 08/08/2020   Lab Results  Component Value Date   WBC 7.5 10/07/2020   HGB 15.4 (H) 10/07/2020   HCT 45.5 10/07/2020   MCV 86.2 10/07/2020   PLT 152 10/07/2020   Lab Results  Component Value Date   IRON 77 04/21/2021   TIBC 283 04/21/2021   FERRITIN 336 (H) 04/21/2021    Obesity Behavioral Intervention:    Approximately 15 minutes were spent on the discussion below.  ASK: We discussed the diagnosis of obesity with Brittney Ruiz today and Brittney Ruiz agreed to give [name] permission to discuss obesity behavioral modification therapy today.  ASSESS: Brittney Ruiz has the diagnosis of obesity and her BMI today is 34.3. Brittney Ruiz is in the action stage of change.   ADVISE: Brittney Ruiz was educated on the multiple health risks of obesity as well as the benefit of weight loss to improve her health. She was advised of the need for long term treatment and the importance of lifestyle modifications to improve her current health and to decrease her risk of future health problems.  AGREE: Multiple dietary modification options and treatment options were discussed and Brittney Ruiz agreed to follow the recommendations documented in the above note.  ARRANGE: Brittney Ruiz was educated on the importance of frequent visits to treat obesity as outlined per CMS and USPSTF guidelines and agreed to schedule her next follow up appointment today.  Attestation Statements:   Reviewed by clinician on day of visit: allergies, medications, problem list, medical history, surgical history, family history, social history, and previous encounter notes.  Coral Ceo, CMA, am acting as transcriptionist for [employer], PA-C.  I have reviewed the above documentation for accuracy and completeness, and I agree with the above. Abby Potash, PA-C

## 2021-07-10 ENCOUNTER — Ambulatory Visit (INDEPENDENT_AMBULATORY_CARE_PROVIDER_SITE_OTHER): Payer: Medicare HMO

## 2021-07-10 VITALS — Ht 62.0 in | Wt 187.0 lb

## 2021-07-10 DIAGNOSIS — Z0000 Encounter for general adult medical examination without abnormal findings: Secondary | ICD-10-CM | POA: Diagnosis not present

## 2021-07-10 NOTE — Patient Instructions (Addendum)
°  Ms. Mensch , Thank you for taking time to come for your Medicare Wellness Visit. I appreciate your ongoing commitment to your health goals. Please review the following plan we discussed and let me know if I can assist you in the future.   These are the goals we discussed:  Goals      Healthy Lifestyle     Stay active Healthy diet; monitor sugar intake        This is a list of the screening recommended for you and due dates:  Health Maintenance  Topic Date Due   Zoster (Shingles) Vaccine (1 of 2) 10/07/2021*   Colon Cancer Screening  07/01/2022   Pap Smear  03/07/2023   Tetanus Vaccine  04/18/2023   Mammogram  05/08/2023   Flu Shot  Completed   COVID-19 Vaccine  Completed   Hepatitis C Screening: USPSTF Recommendation to screen - Ages 18-79 yo.  Completed   HIV Screening  Completed   HPV Vaccine  Aged Out  *Topic was postponed. The date shown is not the original due date.

## 2021-07-10 NOTE — Progress Notes (Signed)
Subjective:   Brittney Ruiz is a 56 y.o. female who presents for Medicare Annual (Subsequent) preventive examination.  Review of Systems    No ROS.  Medicare Wellness Virtual Visit.  Visual/audio telehealth visit, UTA vital signs.   See social history for additional risk factors.   Cardiac Risk Factors include: advanced age (>23mn, >>69women);hypertension     Objective:    Today's Vitals   07/10/21 1034  Weight: 187 lb (84.8 kg)  Height: 5' 2"  (1.575 m)   Body mass index is 34.2 kg/m.  Advanced Directives 07/10/2021 07/10/2020 07/09/2020 07/07/2019 05/01/2019 06/28/2018 06/13/2018  Does Patient Have a Medical Advance Directive? No No No No No No No  Does patient want to make changes to medical advance directive? - - No - Patient declined - - - -  Would patient like information on creating a medical advance directive? No - Patient declined - - No - Patient declined - No - Patient declined -    Current Medications (verified) Outpatient Encounter Medications as of 07/10/2021  Medication Sig   albuterol (VENTOLIN HFA) 108 (90 Base) MCG/ACT inhaler USE 2 INHALATIONS ORALLY   EVERY 6 HOURS AS NEEDED.   Alcohol Swabs (DROPSAFE ALCOHOL PREP) 70 % PADS Apply 1 application topically See admin instructions. Used to clean finger before checking glucose.   aspirin EC 81 MG tablet Take 81 mg by mouth at bedtime.   B Complex Vitamins (BL VITAMIN B COMPLEX PO) +Folic acid qd.   Blood Glucose Monitoring Suppl (TRUE METRIX METER) w/Device KIT USED TO CHECK GLUCOSE READINGS  ONE TIME DAILY AS NEEDED.   Calcium Carb-Cholecalciferol (CALCIUM 600/VITAMIN D PO) Take 1 tablet by mouth daily.   Cholecalciferol (VITAMIN D-3) 25 MCG (1000 UT) CAPS Take 1 capsule by mouth daily.   clorazepate (TRANXENE) 7.5 MG tablet Take 7.5 mg by mouth 3 (three) times daily.   fexofenadine (ALLEGRA) 60 MG tablet Take 1 tablet (60 mg total) by mouth daily.   fluticasone (FLONASE) 50 MCG/ACT nasal spray Place 1 spray into  both nostrils at bedtime.   glucose blood (TRUE METRIX BLOOD GLUCOSE TEST) test strip TEST BLOOD SUGAR ONE TIME DAILY   Multiple Vitamin (MULTIVITAMIN) tablet Take 1 tablet by mouth daily.   Omega-3 Fatty Acids (FISH OIL) 1000 MG CAPS Take 1 capsule by mouth daily.   omeprazole (PRILOSEC) 20 MG capsule TAKE 1 CAPSULE DAILY.   polyethylene glycol powder (GLYCOLAX/MIRALAX) 17 GM/SCOOP powder Take 1 Container by mouth at bedtime.   propranolol (INDERAL) 20 MG tablet Take 1 tablet (20 mg total) by mouth 3 (three) times daily.   psyllium (METAMUCIL) 58.6 % powder Take 1 packet by mouth daily.   sertraline (ZOLOFT) 100 MG tablet TAKE 1+1/2 TABLETS BY MOUTH DAILY AS DIRECTED (Patient taking differently: Take 112.5 mg by mouth See admin instructions. 112.5 in the morning and 25 mg in the evening)   sertraline (ZOLOFT) 25 MG tablet Take 25 mg by mouth every evening.    TRUEplus Lancets 33G MISC TEST BLOOD SUGAR ONE TIME DAILY   No facility-administered encounter medications on file as of 07/10/2021.    Allergies (verified) Elemental sulfur, Pork allergy, Pork-derived products, Aciphex [rabeprazole sodium], Antivert [meclizine hcl], Buspar [buspirone hcl], Cat hair extract, Chlorpheniramine-pseudoeph, Chlorpheniramine-pseudoeph, Chocolate flavor, Codeine, Crestor [rosuvastatin calcium], Cymbalta [duloxetine hcl], Duloxetine, Effexor xr [venlafaxine hydrochloride], Epinephrine, Erythromycin, Escitalopram oxalate, Lamictal [lamotrigine], Levofloxacin, Lipitor [atorvastatin], Meclizine, Mometasone furoate, Mometasone furoate, Monosodium glutamate, Neurontin [gabapentin], Other, Prednisone, Red yeast rice extract, Shellfish allergy, Sudafed [  pseudoephedrine hcl], Sulfa antibiotics, Sulfasalazine, Valium, Wellbutrin [bupropion hcl], Wixela inhub [fluticasone-salmeterol], Xanax xr [alprazolam], Zetia [ezetimibe], Alfalfa, Brompheniramine-phenylephrine, Buspirone, and Tape   History: Past Medical History:   Diagnosis Date   Abnormal Pap smear of cervix    h/o LEEP   Allergy    Angio-edema    Anxiety    Asthma    Bladder leak    Cancer ([place])    melanoma 2003   Chest pain    Constipation    COPD (chronic obstructive pulmonary disease) ([place])    Depression    Diarrhea    Food allergy    Heart valve problem    Heartburn    History of swelling of feet    Hyperlipidemia    Hypertension    IBS (irritable bowel syndrome)    Joint pain    Low back pain    Melanoma ([place])    right foot s/p lymph node removed right groin   Obesity (BMI 30-39.9)    Palpitation    Prediabetes    Shortness of breath    Urticaria    Vaginal delivery    X 2   Past Surgical History:  Procedure Laterality Date   BLADDER SUSPENSION  07/2017   COLONOSCOPY WITH PROPOFOL N/A 07/01/2017   Procedure: COLONOSCOPY WITH PROPOFOL;  Surgeon: Virgel Manifold, MD;  Location: [facility] ENDOSCOPY;  Service: Endoscopy;  Laterality: N/A;   ESOPHAGOGASTRODUODENOSCOPY (EGD) WITH PROPOFOL N/A 07/01/2017   Procedure: ESOPHAGOGASTRODUODENOSCOPY (EGD) WITH PROPOFOL;  Surgeon: Virgel Manifold, MD;  Location: [facility] ENDOSCOPY;  Service: Endoscopy;  Laterality: N/A;   LEEP  1995   LEEP     PARTIAL HYSTERECTOMY  08/2017   s/p partial hysterectomy - per patient has fallopian tubes; ovaries INTACT. NO CERVIX on exam 03/06/20   SKIN CANCER EXCISION     melanoma   TONSILLECTOMY     Family History  Problem Relation Age of Onset   Hyperlipidemia Mother    Hypertension Mother    Diabetes Mother    Breast cancer Mother 64   Stroke Mother    Cancer Mother    Cancer Father        lung   Depression Father    Diabetes Father    Hypertension Father    Hyperlipidemia Father    Sleep apnea Father    Alcoholism Father    Hyperlipidemia Sister    Hypertension Sister    Migraines Sister    Hypertension Brother    Hyperlipidemia Brother    Migraines Brother    Cancer Maternal Grandmother        glioblastoma   Depression  Maternal Grandfather    Heart disease Maternal Grandfather    Cancer Paternal Grandfather        unknown   AAA (abdominal aortic aneurysm) Paternal Grandfather    Social History   Socioeconomic History   Marital status: Married    Spouse name: Not on file   Number of children: Not on file   Years of education: Not on file   Highest education level: Not on file  Occupational History   Occupation: disability  Tobacco Use   Smoking status: Former    Packs/day: 1.00    Years: 25.00    Pack years: 25.00    Types: Cigarettes    Quit date: 2008    Years since quitting: 15.1   Smokeless tobacco: Never  Vaping Use   Vaping Use: Never used  Substance and Sexual Activity   Alcohol  use: No   Drug use: No   Sexual activity: Yes  Other Topics Concern   Not on file  Social History Narrative   Married 25 years.        2 children by 1st husband.       Disabled. Not working.    Right handed      One story home      Social Determinants of Health   Financial Resource Strain: Low Risk    Difficulty of Paying Living Expenses: Not hard at all  Food Insecurity: No Food Insecurity   Worried About Charity fundraiser in the Last Year: Never true   Ran Out of Food in the Last Year: Never true  Transportation Needs: No Transportation Needs   Lack of Transportation (Medical): No   Lack of Transportation (Non-Medical): No  Physical Activity: Insufficiently Active   Days of Exercise per Week: 7 days   Minutes of Exercise per Session: 10 min  Stress: No Stress Concern Present   Feeling of Stress : Not at all  Social Connections: Socially Integrated   Frequency of Communication with Friends and Family: More than three times a week   Frequency of Social Gatherings with Friends and Family: Not on file   Attends Religious Services: 1 to 4 times per year   Active Member of [employer] or Organizations: Yes   Attends Archivist Meetings: Not on file   Marital Status: Married     Tobacco Counseling Counseling given: Not Answered   Clinical Intake:  Pre-visit preparation completed: Yes        Diabetes: No  How often do you need to have someone help you when you read instructions, pamphlets, or other written materials from your doctor or pharmacy?: 1 - Never   Interpreter Needed?: No      Activities of Daily Living In your present state of health, do you have any difficulty performing the following activities: 07/10/2021  Hearing? N  Vision? N  Difficulty concentrating or making decisions? N  Walking or climbing stairs? N  Comment Paces self  Dressing or bathing? N  Doing errands, shopping? Y  Comment Family assist as needed  Preparing Food and eating ? N  Using the Toilet? N  In the past six months, have you accidently leaked urine? Y  Comment Managed with daily pad  Do you have problems with loss of bowel control? N  Managing your Medications? N  Managing your Finances? N  Housekeeping or managing your Housekeeping? N  Some recent data might be hidden    Patient Care Team: Burnard Hawthorne, FNP as PCP - General (Family Medicine) Alda Berthold, DO as Consulting Physician (Neurology)  Indicate any recent Medical Services you may have received from other than [facility] providers in the past year (date may be approximate).     Assessment:   This is a routine wellness examination for Stepheni.   Virtual Visit via Telephone Note  I connected with  ENORA TRILLO on 07/10/21 at 10:30 AM EST by telephone and verified that I am speaking with the correct person using two identifiers.  Persons participating in the virtual visit: patient/Nurse Health Advisor   I discussed the limitations, risks, security and privacy concerns of performing an evaluation and management service by telephone and the availability of in person appointments. The patient expressed understanding and agreed to proceed.  Interactive audio and video telecommunications were  attempted between this nurse and patient, however failed, due  to patient having technical difficulties OR patient did not have access to video capability.  We continued and completed visit with audio only.  Some vital signs may be absent or patient reported.   Hearing/Vision screen Hearing Screening - Comments:: Patient is able to hear conversational tones without difficulty. No issues reported. Vision Screening - Comments:: Followed by [facility] Wears corrective lenses  They have seen their ophthalmologist in the last 12 months.   Dietary issues and exercise activities discussed: Current Exercise Habits: Home exercise routine, Intensity: Mild Healthy diet; monitors sugar intake    Goals Addressed             This Visit's Progress    Healthy Lifestyle   On track    Stay active Healthy diet; monitor sugar intake       Depression Screen PHQ 2/9 Scores 07/10/2021 03/03/2021 10/07/2020 09/17/2020 07/09/2020 06/14/2020 07/07/2019  PHQ - 2 Score 0 2 0 0 - - -  PHQ- 9 Score - 9 - - - - -  Exception Documentation - - - - Other- indicate reason in comment box Medical reason Other- indicate reason in comment box  Not completed - - - - Psychiatry/counseling every 3 months - Managed by psychiatry every 3 months    Fall Risk Fall Risk  10/07/2020 07/09/2020 08/08/2019 07/07/2019 05/01/2019  Falls in the past year? 0 0 0 0 0  Number falls in past yr: 0 0 - - 0  Injury with Fall? 0 0 - - 0  Follow up Falls evaluation completed Falls evaluation completed Falls evaluation completed Falls evaluation completed -   FALL RISK PREVENTION PERTAINING TO THE HOME: Home free of loose throw rugs in walkways, pet beds, electrical cords, etc? Yes  Adequate lighting in your home to reduce risk of falls? Yes   ASSISTIVE DEVICES UTILIZED TO PREVENT FALLS: Life alert? No  Use of a cane, walker or w/c? No   Cognitive Function: Patient is alert and oriented x3.  MMSE - Mini Mental State Exam 06/25/2017   Orientation to time 5  Orientation to Place 5  Registration 3  Attention/ Calculation 5  Recall 3  Language- name 2 objects 2  Language- repeat 1  Language- follow 3 step command 3  Language- read & follow direction 1  Write a sentence 1  Copy design 1  Total score 30     6CIT Screen 07/09/2020 07/07/2019 06/28/2018 06/24/2016  What Year? 0 points 0 points 0 points 0 points  What month? 0 points 0 points 0 points 0 points  What time? 0 points 0 points 0 points 0 points  Count back from 20 - 0 points 0 points 0 points  Months in reverse 0 points 0 points 0 points 0 points  Repeat phrase - 0 points 0 points 0 points  Total Score - 0 0 0    Immunizations Immunization History  Administered Date(s) Administered   Influenza Inj Mdck Quad Pf 03/05/2017, 03/22/2019   Influenza Split 04/17/2013   Influenza Whole 05/18/2008, 05/29/2011   Influenza,inj,Quad PF,6+ Mos 02/21/2016, 03/03/2021   Influenza-Unspecified 02/16/2016, 03/05/2017, 05/06/2020   PFIZER(Purple Top)SARS-COV-2 Vaccination 08/14/2019, 09/06/2019, 03/27/2020   Pfizer Covid-19 Vaccine Bivalent Booster 37yr & up 05/27/2021   Pneumococcal Polysaccharide-23 05/02/2018   Td 05/18/1997   Tdap 07/16/2009, 04/17/2013    Shingrix Completed?: No.    Education has been provided regarding the importance of this vaccine. Patient has been advised to call insurance company to determine out of pocket expense  if they have not yet received this vaccine. Advised may also receive vaccine at local pharmacy or Health Dept. Verbalized acceptance and understanding.  Screening Tests Health Maintenance  Topic Date Due   Zoster Vaccines- Shingrix (1 of 2) 10/07/2021 (Originally 02/09/1985)   COLONOSCOPY (Pts 45-69yr Insurance coverage will need to be confirmed)  07/01/2022   PAP SMEAR-Modifier  03/07/2023   TETANUS/TDAP  04/18/2023   MAMMOGRAM  05/08/2023   INFLUENZA VACCINE  Completed   COVID-19 Vaccine  Completed   Hepatitis C Screening   Completed   HIV Screening  Completed   HPV VACCINES  Aged Out   Health Maintenance There are no preventive care reminders to display for this patient.  Lung Cancer Screening: Low Dose CT Chest scheduled 07/15/21.  Vision Screening: Recommended annual ophthalmology exams for early detection of glaucoma and other disorders of the eye.  Dental Screening: Recommended annual dental exams for proper oral hygiene  Community Resource Referral / Chronic Care Management: CRR required this visit?  No   CCM required this visit?  No      Plan:   Keep all routine maintenance appointments.   I have personally reviewed and noted the following in the patients chart:   Medical and social history Use of alcohol, tobacco or illicit drugs  Current medications and supplements including opioid prescriptions.  Functional ability and status Nutritional status Physical activity Advanced directives List of other physicians Hospitalizations, surgeries, and ER visits in previous 12 months Vitals Screenings to include cognitive, depression, and falls Referrals and appointments  In addition, I have reviewed and discussed with patient certain preventive protocols, quality metrics, and best practice recommendations. A written personalized care plan for preventive services as well as general preventive health recommendations were provided to patient via mychart.     OVarney Biles LPN   20/10/3492

## 2021-07-15 ENCOUNTER — Ambulatory Visit: Payer: Medicare HMO

## 2021-07-21 ENCOUNTER — Other Ambulatory Visit: Payer: Self-pay

## 2021-07-21 ENCOUNTER — Encounter: Payer: Self-pay | Admitting: Gastroenterology

## 2021-07-21 ENCOUNTER — Ambulatory Visit (INDEPENDENT_AMBULATORY_CARE_PROVIDER_SITE_OTHER): Payer: Medicare HMO | Admitting: Gastroenterology

## 2021-07-21 VITALS — BP 118/78 | HR 75 | Temp 97.5°F | Wt 191.4 lb

## 2021-07-21 DIAGNOSIS — K760 Fatty (change of) liver, not elsewhere classified: Secondary | ICD-10-CM | POA: Diagnosis not present

## 2021-07-21 DIAGNOSIS — Z23 Encounter for immunization: Secondary | ICD-10-CM

## 2021-07-21 NOTE — Progress Notes (Signed)
?  ?Jonathon Bellows MD, MRCP(U.K) ?[place]  ?[address]  ?[place], [place] [address]  ?Main: [phone]  ?Fax: [phone] ? ? ?Primary Care Physician: Burnard Hawthorne, FNP ? ?Primary Gastroenterologist:  Dr. Jonathon Bellows  ? ?Chief Complaint  ?Patient presents with  ? Fatty Liver  ? ? ?HPI: Brittney Ruiz is a 56 y.o. female ? ?Summary of history : ? ?She was initially referred and seen on 04/21/2021 for fatty liver.  Seen by Dr. Bonna Gains.  Fatty liver incidentally found on imaging.  At that point of time mild elevation of ALT at 38 normal AST, albumin of 4.3 and total bilirubin of 0.5. ? ? ?Interval history 04/21/2021-07/21/2021. ?04/21/2021: Hepatitis C, ANA, LK M antibody, hepatitis B surface antigen, smooth muscle antibody ceruloplasmin, iron studies normal.  Not immune to hepatitis A and B. ? ?06/09/2021 LDL 182, HbA1c 5.3 ? ?She is doing well.  She says that she cannot tolerate a statin due to allergies.  She says she cannot tolerate Zetia.  She is trying to lose weight intentionally. ?Current Outpatient Medications  ?Medication Sig Dispense Refill  ? albuterol (VENTOLIN HFA) 108 (90 Base) MCG/ACT inhaler USE 2 INHALATIONS ORALLY   EVERY 6 HOURS AS NEEDED. 18 g 1  ? Alcohol Swabs (DROPSAFE ALCOHOL PREP) 70 % PADS Apply 1 application topically See admin instructions. Used to clean finger before checking glucose. 300 each 1  ? aspirin EC 81 MG tablet Take 81 mg by mouth at bedtime.    ? B Complex Vitamins (BL VITAMIN B COMPLEX PO) +Folic acid qd.    ? Blood Glucose Monitoring Suppl (TRUE METRIX METER) w/Device KIT USED TO CHECK GLUCOSE READINGS  ONE TIME DAILY AS NEEDED. 1 kit 0  ? Calcium Carb-Cholecalciferol (CALCIUM 600/VITAMIN D PO) Take 1 tablet by mouth daily.    ? Cholecalciferol (VITAMIN D-3) 25 MCG (1000 UT) CAPS Take 1 capsule by mouth daily.    ? clorazepate (TRANXENE) 7.5 MG tablet Take 7.5 mg by mouth 3 (three) times daily.    ? fexofenadine (ALLEGRA) 60 MG tablet Take 1 tablet (60 mg total) by  mouth daily. 90 tablet 1  ? fluticasone (FLONASE) 50 MCG/ACT nasal spray Place 1 spray into both nostrils at bedtime. 16.2 g 2  ? glucose blood (TRUE METRIX BLOOD GLUCOSE TEST) test strip TEST BLOOD SUGAR ONE TIME DAILY 100 strip 0  ? Multiple Vitamin (MULTIVITAMIN) tablet Take 1 tablet by mouth daily.    ? Omega-3 Fatty Acids (FISH OIL) 1000 MG CAPS Take 1 capsule by mouth daily.    ? omeprazole (PRILOSEC) 20 MG capsule TAKE 1 CAPSULE DAILY. 90 capsule 1  ? polyethylene glycol powder (GLYCOLAX/MIRALAX) 17 GM/SCOOP powder Take 1 Container by mouth at bedtime.    ? propranolol (INDERAL) 20 MG tablet Take 1 tablet (20 mg total) by mouth 3 (three) times daily. 90 tablet 3  ? psyllium (METAMUCIL) 58.6 % powder Take 1 packet by mouth daily.    ? sertraline (ZOLOFT) 100 MG tablet TAKE 1+1/2 TABLETS BY MOUTH DAILY AS DIRECTED (Patient taking differently: Take 112.5 mg by mouth See admin instructions. 112.5 in the morning and 25 mg in the evening) 45 tablet 3  ? sertraline (ZOLOFT) 25 MG tablet Take 25 mg by mouth every evening.     ? TRUEplus Lancets 33G MISC TEST BLOOD SUGAR ONE TIME DAILY 100 each 0  ? ?No current facility-administered medications for this visit.  ? ? ?Allergies as of 07/21/2021 - Review Complete  07/21/2021  ?Allergen Reaction Noted  ? Elemental sulfur Swelling, Hives, and Other (See Comments) 09/23/1983  ? Pork allergy Swelling 10/11/2012  ? Pork-derived products Swelling 10/11/2012  ? Aciphex [rabeprazole sodium]  03/10/2011  ? Antivert [meclizine hcl]  03/10/2011  ? Buspar [buspirone hcl]  03/10/2011  ? Cat hair extract  10/11/2012  ? Chlorpheniramine-pseudoeph  03/10/2011  ? Chlorpheniramine-pseudoeph Other (See Comments) 06/05/2016  ? Chocolate flavor  10/14/2012  ? Codeine  03/10/2011  ? Crestor [rosuvastatin calcium]  06/10/2017  ? Cymbalta [duloxetine hcl]  03/10/2011  ? Duloxetine Other (See Comments) 10/11/2012  ? Effexor xr [venlafaxine hydrochloride]  03/10/2011  ? Epinephrine  03/10/2011  ?  Erythromycin  03/10/2011  ? Escitalopram oxalate  03/10/2011  ? Lamictal [lamotrigine]  03/10/2011  ? Levofloxacin Swelling 04/03/2011  ? Lipitor [atorvastatin]  08/09/2015  ? Meclizine Other (See Comments) 10/11/2012  ? Mometasone furoate  03/10/2011  ? Mometasone furoate  03/10/2011  ? Monosodium glutamate  10/11/2012  ? Neurontin [gabapentin]  03/10/2011  ? Other  10/11/2012  ? Prednisone  03/10/2011  ? Red yeast rice extract  10/18/2020  ? Shellfish allergy  10/11/2012  ? Sudafed [pseudoephedrine hcl]  03/10/2011  ? Sulfa antibiotics Other (See Comments) 03/10/2011  ? Sulfasalazine Other (See Comments) 03/10/2011  ? Valium  03/10/2011  ? Wellbutrin [bupropion hcl]  03/10/2011  ? Wixela inhub [fluticasone-salmeterol]  06/01/2019  ? Xanax xr [alprazolam]  03/27/2011  ? Zetia [ezetimibe]  02/29/2020  ? Alfalfa Rash 10/15/2012  ? Brompheniramine-phenylephrine Palpitations 12/05/2012  ? Buspirone Rash and Other (See Comments) 10/11/2012  ? Tape Rash 03/10/2011  ? ? ?ROS: ? ?General: Negative for anorexia, weight loss, fever, chills, fatigue, weakness. ?ENT: Negative for hoarseness, difficulty swallowing , nasal congestion. ?CV: Negative for chest pain, angina, palpitations, dyspnea on exertion, peripheral edema.  ?Respiratory: Negative for dyspnea at rest, dyspnea on exertion, cough, sputum, wheezing.  ?GI: See history of present illness. ?GU:  Negative for dysuria, hematuria, urinary incontinence, urinary frequency, nocturnal urination.  ?Endo: Negative for unusual weight change.  ?  ?Physical Examination: ? ? BP 118/78   Pulse 75   Temp (!) 97.5 ?F (36.4 ?C) (Oral)   Wt 191 lb 6.4 oz (86.8 kg)   LMP  (LMP Unknown)   BMI 35.01 kg/m?  ? ?General: Well-nourished, well-developed in no acute distress.  ?Eyes: No icterus. Conjunctivae pink. ?Mouth: Oropharyngeal mucosa moist and pink , no lesions erythema or exudate. ?Neuro: Alert and oriented x 3.  Grossly intact. ?Skin: Warm and dry, no jaundice.   ?Psych: Alert  and cooperative, normal mood and affect. ? ?Body mass index is 35.01 kg/m?.  ?Imaging Studies: ?No results found. ? ?Assessment and Plan:  ? ?Brittney Ruiz is a 56 y.o. y/o female here to follow-up for nonalcoholic fatty liver disease.  Back in 2022 and seen had mild elevation of ALT. Not immune to hepatitis a and B. ? ?Plan ?1.  Recommend exercise, weight loss, healthy eating recommend Mediterranean diet patient information will be provided.  She is already enrolled in a program ? ?2.  We will give her hepatitis a and B vaccination, limit excess alcohol consumption. ? ?3.  Recommend her to have her LDL addressed with the newer medications which have recently been authorized, particularly useful in people were not tolerant to statins and Zetia ? ? ? ?Dr Jonathon Bellows  MD,MRCP [facility]) ?Follow up in 1 year ?

## 2021-07-23 ENCOUNTER — Telehealth: Payer: Self-pay | Admitting: Family

## 2021-07-23 NOTE — Telephone Encounter (Signed)
close

## 2021-07-28 ENCOUNTER — Other Ambulatory Visit: Payer: Self-pay

## 2021-07-28 ENCOUNTER — Ambulatory Visit
Admission: RE | Admit: 2021-07-28 | Discharge: 2021-07-28 | Disposition: A | Discharge status: Home / Self Care | Payer: Medicare HMO | Source: Ambulatory Visit | Attending: Acute Care | Admitting: Acute Care

## 2021-07-28 DIAGNOSIS — Z87891 Personal history of nicotine dependence: Secondary | ICD-10-CM | POA: Insufficient documentation

## 2021-07-30 ENCOUNTER — Other Ambulatory Visit: Payer: Self-pay

## 2021-07-30 DIAGNOSIS — Z87891 Personal history of nicotine dependence: Secondary | ICD-10-CM

## 2021-08-04 ENCOUNTER — Encounter (INDEPENDENT_AMBULATORY_CARE_PROVIDER_SITE_OTHER): Payer: Self-pay | Admitting: Physician Assistant

## 2021-08-04 ENCOUNTER — Other Ambulatory Visit: Payer: Self-pay

## 2021-08-04 ENCOUNTER — Ambulatory Visit (INDEPENDENT_AMBULATORY_CARE_PROVIDER_SITE_OTHER): Payer: Medicare HMO | Admitting: Physician Assistant

## 2021-08-04 VITALS — BP 104/70 | HR 62 | Temp 97.7°F | Ht 62.0 in | Wt 187.0 lb

## 2021-08-04 DIAGNOSIS — Z6834 Body mass index (BMI) 34.0-34.9, adult: Secondary | ICD-10-CM

## 2021-08-04 DIAGNOSIS — E669 Obesity, unspecified: Secondary | ICD-10-CM

## 2021-08-04 DIAGNOSIS — R7303 Prediabetes: Secondary | ICD-10-CM

## 2021-08-06 ENCOUNTER — Telehealth: Payer: Self-pay | Admitting: Family

## 2021-08-06 ENCOUNTER — Encounter: Payer: Self-pay | Admitting: Family

## 2021-08-06 NOTE — Progress Notes (Signed)
? ? ? ?Chief Complaint:  ? ?OBESITY ?Brittney Ruiz is here to discuss her progress with her obesity treatment plan along with follow-up of her obesity related diagnoses. Brittney Ruiz is on the Category 3 Plan and states she is following her eating plan approximately 85% of the time. Brittney Ruiz states she is walking for 30 minutes 1 times per week. ? ?Today's visit was #: 14 ?Starting weight: 217 lbs ?Starting date: 06/01/2019 ?Today's weight: 187 lbs ?Today's date: 08/04/2021 ?Total lbs lost to date: 30 lbs ?Total lbs lost since last in-office visit: 0 ? ?Interim History: Brittney Ruiz is frustrated with lack of weight loss because she feels that she has been following the plan well. She is drinking protein shakes the last 4 days for lunch.  ? ?Subjective:  ? ?1. Prediabetes ?Brittney Ruiz's last A1C was 5.3. She is not on medications currently. She denies polyphagia.  ? ?Assessment/Plan:  ? ?1. Prediabetes ?Brittney Ruiz will continue with the plan and she will continue to work on weight loss, exercise, and decreasing simple carbohydrates to help decrease the risk of diabetes.  ? ?2. Obesity with current BMI of 34.19 ?Brittney Ruiz is currently in the action stage of change. As such, her goal is to continue with weight loss efforts. She has agreed to keeping a food journal and adhering to recommended goals of 1200-1400 calories and 90 grams of protein daily.  ? ?We will check IC at next office visit. ? ?Exercise goals:  Brittney Ruiz will start walking 15 minutes 5 days per week.  ? ?Behavioral modification strategies: increasing lean protein intake and no skipping meals. ? ?Brittney Ruiz has agreed to follow-up with our clinic in 4 weeks. She was informed of the importance of frequent follow-up visits to maximize her success with intensive lifestyle modifications for her multiple health conditions.  ? ?Objective:  ? ?Blood pressure 104/70, pulse 62, temperature 97.7 ?F (36.5 ?C), height '5\' 2"'$  (1.575 m), weight 187 lb (84.8 kg), SpO2 97 %. ?Body mass index is 34.2 kg/m?. ? ?General:  Cooperative, alert, well developed, in no acute distress. ?HEENT: Conjunctivae and lids unremarkable. ?Cardiovascular: Regular rhythm.  ?Lungs: Normal work of breathing. ?Neurologic: No focal deficits.  ? ?Lab Results  ?Component Value Date  ? CREATININE 0.71 03/03/2021  ? BUN 24 (H) 03/03/2021  ? NA 139 03/03/2021  ? K 4.1 03/03/2021  ? CL 103 03/03/2021  ? CO2 29 03/03/2021  ? ?Lab Results  ?Component Value Date  ? ALT 26 03/24/2021  ? AST 21 03/24/2021  ? ALKPHOS 68 03/24/2021  ? BILITOT 0.5 03/24/2021  ? ?Lab Results  ?Component Value Date  ? HGBA1C 5.3 06/09/2021  ? HGBA1C 5.5 12/04/2020  ? HGBA1C 5.5 08/08/2020  ? HGBA1C 5.4 05/07/2020  ? HGBA1C 5.4 11/14/2019  ? ?Lab Results  ?Component Value Date  ? INSULIN 8.2 06/09/2021  ? INSULIN 10.4 12/04/2020  ? INSULIN 10.7 08/08/2020  ? INSULIN 8.2 05/07/2020  ? INSULIN 8.5 11/14/2019  ? ?Lab Results  ?Component Value Date  ? TSH 1.48 03/03/2021  ? ?Lab Results  ?Component Value Date  ? CHOL 237 (H) 06/09/2021  ? HDL 32 (L) 06/09/2021  ? LDLCALC 182 (H) 06/09/2021  ? LDLDIRECT 141.0 05/02/2018  ? TRIG 124 06/09/2021  ? CHOLHDL 7.4 (H) 06/09/2021  ? ?Lab Results  ?Component Value Date  ? VD25OH 48.7 06/09/2021  ? VD25OH 58.9 12/04/2020  ? VD25OH 45.2 08/08/2020  ? ?Lab Results  ?Component Value Date  ? WBC 7.5 10/07/2020  ? HGB 15.4 (H) 10/07/2020  ?  HCT 45.5 10/07/2020  ? MCV 86.2 10/07/2020  ? PLT 152 10/07/2020  ? ?Lab Results  ?Component Value Date  ? IRON 77 04/21/2021  ? TIBC 283 04/21/2021  ? FERRITIN 336 (H) 04/21/2021  ? ? ?Obesity Behavioral Intervention:  ? ?Approximately 15 minutes were spent on the discussion below. ? ?ASK: ?We discussed the diagnosis of obesity with Brittney Ruiz today and Brittney Ruiz agreed to give [name] permission to discuss obesity behavioral modification therapy today. ? ?ASSESS: ?Brittney Ruiz has the diagnosis of obesity and her BMI today is 34.2. Brittney Ruiz is in the action stage of change.  ? ?ADVISE: ?Brittney Ruiz was educated on the multiple health risks of obesity as  well as the benefit of weight loss to improve her health. She was advised of the need for long term treatment and the importance of lifestyle modifications to improve her current health and to decrease her risk of future health problems. ? ?AGREE: ?Multiple dietary modification options and treatment options were discussed and Brittney Ruiz agreed to follow the recommendations documented in the above note. ? ?ARRANGE: ?Brittney Ruiz was educated on the importance of frequent visits to treat obesity as outlined per CMS and USPSTF guidelines and agreed to schedule her next follow up appointment today. ? ?Attestation Statements:  ? ?Reviewed by clinician on day of visit: allergies, medications, problem list, medical history, surgical history, family history, social history, and previous encounter notes. ? ?I, Tonye Pearson, am acting as Location manager for [employer], PA-C. ? ?I have reviewed the above documentation for accuracy and completeness, and I agree with the above. Abby Potash, PA-C ? ?

## 2021-08-06 NOTE — Telephone Encounter (Signed)
close

## 2021-08-09 ENCOUNTER — Encounter: Payer: Self-pay | Admitting: Family

## 2021-08-11 ENCOUNTER — Ambulatory Visit: Payer: Medicare HMO

## 2021-08-11 ENCOUNTER — Encounter: Payer: Self-pay | Admitting: Family

## 2021-08-11 ENCOUNTER — Ambulatory Visit: Payer: Medicare HMO | Admitting: Family

## 2021-08-11 NOTE — Telephone Encounter (Signed)
Spoke to patient and scheduled appointment for today at 12 noon. ?

## 2021-08-11 NOTE — Telephone Encounter (Signed)
LMTCB to schedule appointment for TETANUS shot ?

## 2021-08-12 ENCOUNTER — Encounter: Payer: Self-pay | Admitting: Family

## 2021-08-12 ENCOUNTER — Other Ambulatory Visit: Payer: Self-pay

## 2021-08-12 ENCOUNTER — Ambulatory Visit (INDEPENDENT_AMBULATORY_CARE_PROVIDER_SITE_OTHER): Payer: Medicare HMO | Admitting: Family

## 2021-08-12 VITALS — BP 128/82 | HR 96 | Temp 97.8°F | Ht 61.0 in | Wt 187.8 lb

## 2021-08-12 DIAGNOSIS — R3915 Urgency of urination: Secondary | ICD-10-CM | POA: Diagnosis not present

## 2021-08-12 DIAGNOSIS — M545 Low back pain, unspecified: Secondary | ICD-10-CM

## 2021-08-12 DIAGNOSIS — S41101A Unspecified open wound of right upper arm, initial encounter: Secondary | ICD-10-CM

## 2021-08-12 DIAGNOSIS — S41109A Unspecified open wound of unspecified upper arm, initial encounter: Secondary | ICD-10-CM | POA: Insufficient documentation

## 2021-08-12 DIAGNOSIS — Z23 Encounter for immunization: Secondary | ICD-10-CM

## 2021-08-12 LAB — URINALYSIS, ROUTINE W REFLEX MICROSCOPIC
Bilirubin Urine: NEGATIVE
Hgb urine dipstick: NEGATIVE
Ketones, ur: NEGATIVE
Leukocytes,Ua: NEGATIVE
Nitrite: NEGATIVE
RBC / HPF: NONE SEEN (ref 0–?)
Specific Gravity, Urine: <=1.005 — AB (ref 1.000–1.030)
Total Protein, Urine: NEGATIVE
Urine Glucose: NEGATIVE
Urobilinogen, UA: 0.2 (ref 0.0–1.0)
WBC, UA: NONE SEEN (ref 0–?)
pH: 6 (ref 5.0–8.0)

## 2021-08-12 MED ORDER — MELOXICAM 7.5 MG PO TABS: 7.5000 mg | ORAL_TABLET | Freq: Every day | ORAL | 1 refills | Status: DC | PRN

## 2021-08-12 MED ORDER — MUPIROCIN 2 % EX OINT: 1.0000 "application " | TOPICAL_OINTMENT | Freq: Two times a day (BID) | CUTANEOUS | 2 refills | Status: DC

## 2021-08-12 NOTE — Progress Notes (Signed)
? ?Subjective:  ? ? Patient ID: Brittney Ruiz, female    DOB: [birth year], 56 y.o.   MRN: [id number] ? ?CC: Brittney Ruiz is a 56 y.o. female who presents today for an acute visit.   ? ?HPI: Describes laceration and right forearm 3 different places  which occurred 4 days ago when working with chicken wire.  Healing well. Closed and not bleeding. She cleaned using soap and water, hydrogen peroxide.  Is tender raised and bruised. Soreness has improved. She has been keeping area clean.  Last tetanus shot was 04/17/2013. No  fever, discharge.  ? ? ? ?Complains of left posterior hip pain started yesterday, with improvement from yesterday. Tender to touch area. Painful to walk which has improved since yesterday.  Pain was sharp. Not migration. She moved furniture 4 days ago.  She has had left posterior numbness in the past although none today. ?She used an ice pack and tens unit with some relief.  ?Urine has odor. She feels bloated today. Last BM today.  ? ?No dysuria, urinary frequency, N, V, numbness.  ? ?Following with Dr. Vicente Males for nonalcoholic fatty liver disease.  Last seen 07/21/2021.  His office will give her hepatitis A and B vaccination. ? ?DG lumbar spine 09/18/20 showed degenerative changes ?She takes ibuprofen without side effects for low back pain as needed ? ? ?F/u with me 09/03/21 ?HISTORY:  ?Past Medical History:  ?Diagnosis Date  ? Abnormal Pap smear of cervix   ? h/o LEEP  ? Allergy   ? Angio-edema   ? Anxiety   ? Asthma   ? Bladder leak   ? Cancer [facility])   ? melanoma 2003  ? Chest pain   ? Constipation   ? COPD (chronic obstructive pulmonary disease) ([place])   ? Depression   ? Diarrhea   ? Food allergy   ? Heart valve problem   ? Heartburn   ? History of swelling of feet   ? Hyperlipidemia   ? Hypertension   ? IBS (irritable bowel syndrome)   ? Joint pain   ? Low back pain   ? Melanoma ([place])   ? right foot s/p lymph node removed right groin  ? Obesity (BMI 30-39.9)   ? Palpitation   ? Prediabetes   ? Shortness of  breath   ? Urticaria   ? Vaginal delivery   ? X 2  ? ?Past Surgical History:  ?Procedure Laterality Date  ? BLADDER SUSPENSION  07/2017  ? COLONOSCOPY WITH PROPOFOL N/A 07/01/2017  ? Procedure: COLONOSCOPY WITH PROPOFOL;  Surgeon: Virgel Manifold, MD;  Location: [facility] ENDOSCOPY;  Service: Endoscopy;  Laterality: N/A;  ? ESOPHAGOGASTRODUODENOSCOPY (EGD) WITH PROPOFOL N/A 07/01/2017  ? Procedure: ESOPHAGOGASTRODUODENOSCOPY (EGD) WITH PROPOFOL;  Surgeon: Virgel Manifold, MD;  Location: [facility] ENDOSCOPY;  Service: Endoscopy;  Laterality: N/A;  ? LEEP  1995  ? LEEP    ? PARTIAL HYSTERECTOMY  08/2017  ? s/p partial hysterectomy - per patient has fallopian tubes; ovaries INTACT. NO CERVIX on exam 03/06/20  ? SKIN CANCER EXCISION    ? melanoma  ? TONSILLECTOMY    ? ?Family History  ?Problem Relation Age of Onset  ? Hyperlipidemia Mother   ? Hypertension Mother   ? Diabetes Mother   ? Breast cancer Mother 39  ? Stroke Mother   ? Cancer Mother   ? Cancer Father   ?     lung  ? Depression Father   ? Diabetes Father   ?  Hypertension Father   ? Hyperlipidemia Father   ? Sleep apnea Father   ? Alcoholism Father   ? Hyperlipidemia Sister   ? Hypertension Sister   ? Migraines Sister   ? Hypertension Brother   ? Hyperlipidemia Brother   ? Migraines Brother   ? Cancer Maternal Grandmother   ?     glioblastoma  ? Depression Maternal Grandfather   ? Heart disease Maternal Grandfather   ? Cancer Paternal Grandfather   ?     unknown  ? AAA (abdominal aortic aneurysm) Paternal Grandfather   ? ? ?Allergies: Elemental sulfur, Pork allergy, Pork-derived products, Aciphex [rabeprazole sodium], Antivert [meclizine hcl], Buspar [buspirone hcl], Cat hair extract, Chlorpheniramine-pseudoeph, Chlorpheniramine-pseudoeph, Chocolate flavor, Codeine, Crestor [rosuvastatin calcium], Cymbalta [duloxetine hcl], Duloxetine, Effexor xr [venlafaxine hydrochloride], Epinephrine, Erythromycin, Escitalopram oxalate, Lamictal [lamotrigine], Levofloxacin,  Lipitor [atorvastatin], Meclizine, Mometasone furoate, Mometasone furoate, Monosodium glutamate, Neurontin [gabapentin], Other, Prednisone, Red yeast rice extract, Shellfish allergy, Sudafed [pseudoephedrine hcl], Sulfa antibiotics, Sulfasalazine, Valium, Wellbutrin [bupropion hcl], Wixela inhub [fluticasone-salmeterol], Xanax xr [alprazolam], Zetia [ezetimibe], Alfalfa, Brompheniramine-phenylephrine, Buspirone, and Tape ?Current Outpatient Medications on File Prior to Visit  ?Medication Sig Dispense Refill  ? albuterol (VENTOLIN HFA) 108 (90 Base) MCG/ACT inhaler USE 2 INHALATIONS ORALLY   EVERY 6 HOURS AS NEEDED. 18 g 1  ? Alcohol Swabs (DROPSAFE ALCOHOL PREP) 70 % PADS Apply 1 application topically See admin instructions. Used to clean finger before checking glucose. 300 each 1  ? aspirin EC 81 MG tablet Take 81 mg by mouth at bedtime.    ? B Complex Vitamins (BL VITAMIN B COMPLEX PO) +Folic acid qd.    ? Blood Glucose Monitoring Suppl (TRUE METRIX METER) w/Device KIT USED TO CHECK GLUCOSE READINGS  ONE TIME DAILY AS NEEDED. 1 kit 0  ? Calcium Carb-Cholecalciferol (CALCIUM 600/VITAMIN D PO) Take 1 tablet by mouth daily.    ? Cholecalciferol (VITAMIN D-3) 25 MCG (1000 UT) CAPS Take 1 capsule by mouth daily.    ? clorazepate (TRANXENE) 7.5 MG tablet Take 7.5 mg by mouth 3 (three) times daily.    ? fexofenadine (ALLEGRA) 60 MG tablet Take 1 tablet (60 mg total) by mouth daily. 90 tablet 1  ? fluticasone (FLONASE) 50 MCG/ACT nasal spray Place 1 spray into both nostrils at bedtime. 16.2 g 2  ? glucose blood (TRUE METRIX BLOOD GLUCOSE TEST) test strip TEST BLOOD SUGAR ONE TIME DAILY 100 strip 0  ? Multiple Vitamin (MULTIVITAMIN) tablet Take 1 tablet by mouth daily.    ? Omega-3 Fatty Acids (FISH OIL) 1000 MG CAPS Take 1 capsule by mouth daily.    ? omeprazole (PRILOSEC) 20 MG capsule TAKE 1 CAPSULE DAILY. 90 capsule 1  ? polyethylene glycol powder (GLYCOLAX/MIRALAX) 17 GM/SCOOP powder Take 1 Container by mouth at  bedtime.    ? propranolol (INDERAL) 20 MG tablet Take 1 tablet (20 mg total) by mouth 3 (three) times daily. 90 tablet 3  ? psyllium (METAMUCIL) 58.6 % powder Take 1 packet by mouth daily.    ? sertraline (ZOLOFT) 100 MG tablet TAKE 1+1/2 TABLETS BY MOUTH DAILY AS DIRECTED (Patient taking differently: Take 112.5 mg by mouth See admin instructions. 112.5 in the morning and 25 mg in the evening) 45 tablet 3  ? sertraline (ZOLOFT) 25 MG tablet Take 25 mg by mouth every evening.     ? TRUEplus Lancets 33G MISC TEST BLOOD SUGAR ONE TIME DAILY 100 each 0  ? ?No current facility-administered medications on file prior to visit.  ? ? ?  Social History  ? ?Tobacco Use  ? Smoking status: Former  ?  Packs/day: 1.00  ?  Years: 25.00  ?  Pack years: 25.00  ?  Types: Cigarettes  ?  Quit date: 2008  ?  Years since quitting: 15.2  ? Smokeless tobacco: Never  ?Vaping Use  ? Vaping Use: Never used  ?Substance Use Topics  ? Alcohol use: No  ? Drug use: No  ? ? ?Review of Systems  ?Constitutional:  Negative for chills and fever.  ?Respiratory:  Negative for cough.   ?Cardiovascular:  Negative for chest pain and palpitations.  ?Gastrointestinal:  Negative for nausea and vomiting.  ?Musculoskeletal:  Positive for back pain.  ?Skin:  Positive for wound.  ?Neurological:  Positive for numbness (none today).  ?   ?Objective:  ?  ?BP 128/82 (BP Location: Left Arm, Patient Position: Sitting, Cuff Size: Normal)   Pulse 96   Temp 97.8 ?F (36.6 ?C) (Oral)   Ht 5' 1" (1.549 m)   Wt 187 lb 12.8 oz (85.2 kg)   LMP  (LMP Unknown)   SpO2 99%   BMI 35.48 kg/m?  ? ? ?Physical Exam ?Vitals reviewed.  ?Constitutional:   ?   Appearance: She is well-developed.  ?Eyes:  ?   Conjunctiva/sclera: Conjunctivae normal.  ?Cardiovascular:  ?   Rate and Rhythm: Normal rate and regular rhythm.  ?   Pulses: Normal pulses.  ?   Heart sounds: Normal heart sounds.  ?Pulmonary:  ?   Effort: Pulmonary effort is normal.  ?   Breath sounds: Normal breath sounds. No  wheezing, rhonchi or rales.  ?Abdominal:  ?   Tenderness: There is no right CVA tenderness or left CVA tenderness.  ?Musculoskeletal:  ?   Lumbar back: No swelling, edema, spasms, tenderness or bony tenderness. Normal

## 2021-08-12 NOTE — Patient Instructions (Signed)
Please use bacitracin on the right forearm to cover for bacterial infection.  If you notice increased soreness, redness, red streaks discharge, please call me right away ? ?I have also given you meloxicam which is an anti-inflammatory that you may use for your left low back pain.  Please take with food and only use as needed.  I suspect you will need over the next couple days and then you may stop medication ? ? ?

## 2021-08-12 NOTE — Assessment & Plan Note (Signed)
Suspect acute on chronic low back pain in setting of known degenerative disc disease.  Pending urinalysis to ensure no hematuria which would prompt further investigation of renal stone, urine culture to ensure no urinary tract infection.  Overall pain is improved today.  Advised to continue icing, TENS unit and she may use meloxicam 7.5 mg as needed over the next couple of days.  She will let me know how she is doing ?

## 2021-08-12 NOTE — Assessment & Plan Note (Signed)
Wounds are healing without any symptoms today of infection.  I have provided her with Bactroban to use to cover for  bacterial skin infection.  Tdap provided today as last was greater than 5 years ago.  She will let me know how she is doing ?

## 2021-08-13 LAB — URINE CULTURE
MICRO NUMBER:: 13189916
Result:: NO GROWTH
SPECIMEN QUALITY:: ADEQUATE

## 2021-08-14 ENCOUNTER — Encounter: Payer: Self-pay | Admitting: Family

## 2021-08-19 ENCOUNTER — Telehealth: Payer: Self-pay

## 2021-08-19 NOTE — Telephone Encounter (Signed)
LMTCB to discuss note patient left ?

## 2021-08-19 NOTE — Telephone Encounter (Signed)
LMTCB to discuss note that patient left ?

## 2021-08-20 ENCOUNTER — Other Ambulatory Visit (HOSPITAL_COMMUNITY): Payer: Self-pay

## 2021-08-21 NOTE — Telephone Encounter (Signed)
Spoke to patient and she stated that she didn t want to switch up on her medications. Patient di state that she did have a little soarness, fatigue from the tetanus but it was better now. Patient stated that she wasn't ure about getting another Xray but would contact the office if she decided to proceed with further testing.Patient did verbalize understanding  ?

## 2021-08-22 ENCOUNTER — Ambulatory Visit: Payer: Medicare HMO

## 2021-08-28 ENCOUNTER — Telehealth: Payer: Self-pay

## 2021-08-28 NOTE — Telephone Encounter (Signed)
Received faxed refill request for clorazepate. Called [facility] to have request sent to patient's PCP.  ? ?Beryle Flock, RN ? ?

## 2021-09-01 ENCOUNTER — Telehealth (INDEPENDENT_AMBULATORY_CARE_PROVIDER_SITE_OTHER): Payer: Self-pay

## 2021-09-01 ENCOUNTER — Ambulatory Visit (INDEPENDENT_AMBULATORY_CARE_PROVIDER_SITE_OTHER): Payer: Medicare HMO | Admitting: Gastroenterology

## 2021-09-01 ENCOUNTER — Encounter (INDEPENDENT_AMBULATORY_CARE_PROVIDER_SITE_OTHER): Payer: Self-pay

## 2021-09-01 ENCOUNTER — Ambulatory Visit (INDEPENDENT_AMBULATORY_CARE_PROVIDER_SITE_OTHER): Payer: Medicare HMO | Admitting: Physician Assistant

## 2021-09-01 ENCOUNTER — Ambulatory Visit: Payer: Medicare HMO | Admitting: Family

## 2021-09-01 DIAGNOSIS — Z23 Encounter for immunization: Secondary | ICD-10-CM

## 2021-09-01 NOTE — Progress Notes (Signed)
TwinRIX # 2 administered Left Deltoid 109m.  Pt tolerated wthout difficulty.  ?MFR: GDemetria PoreLot #[id number] 05/21/23. ?

## 2021-09-01 NOTE — Telephone Encounter (Signed)
See my chart message

## 2021-09-02 DIAGNOSIS — R69 Illness, unspecified: Secondary | ICD-10-CM | POA: Diagnosis not present

## 2021-09-02 DIAGNOSIS — F410 Panic disorder [episodic paroxysmal anxiety] without agoraphobia: Secondary | ICD-10-CM | POA: Diagnosis not present

## 2021-09-02 DIAGNOSIS — F39 Unspecified mood [affective] disorder: Secondary | ICD-10-CM | POA: Diagnosis not present

## 2021-09-02 DIAGNOSIS — F4001 Agoraphobia with panic disorder: Secondary | ICD-10-CM | POA: Diagnosis not present

## 2021-09-03 ENCOUNTER — Ambulatory Visit: Payer: Medicare HMO | Admitting: Family

## 2021-09-18 NOTE — Progress Notes (Signed)
?TeleHealth Visit:  ?This visit was completed with telemedicine (audio/video) technology. ?Brittney Ruiz has verbally consented to this TeleHealth visit. The patient is located at home, the provider is located at home. The participants in this visit include the listed provider and patient. The visit was conducted today via MyChart video. ? ?OBESITY ?Brittney Ruiz is here to discuss her progress with her obesity treatment plan along with follow-up of her obesity related diagnoses.  ? ?Today's visit was # 28 ?Starting weight: 217 lbs ?Starting date: 06/01/2019 ?Weight at last in office visit: 187 lbs on 08/04/21 ?Total weight loss: 30 lbs at last in office visit on 08/04/21. ?Today's reported weight: 186 lbs  ? ?Nutrition Plan: keeping a food journal and adhering to recommended goals of 1200-1400 calories and 90 gms protein.  ?Hunger is moderately controlled. Cravings are moderately controlled.  ?Current exercise: strength training in hot tub 3 times per week.  ? ?Interim History: Brittney Ruiz reports that she feels her biggest problem is that she does not eat enough.  She tends to skip meals. ?She is frustrated with weight plateau.  She has not had any weight loss since April 2022.  On the one hand she says she is never hungry but then she also reports that when she ate only 1200 cal she was very hungry.  She is currently supposed to journal and keep calories between 1200 and 1400 cal.  She says she mostly eats around 1400 cal/day.  She has not journaled since March but says she tends to eat the same thing every day.  Food recall indicates she is probably low on protein intake. ? ?Assessment/Plan:  ?1. Prediabetes ?Brittney Ruiz  had an elevated A1c of 5.7 in December 2019.  Ozempic has been offered in the past but she does not want to take it and take the drug away from people with diabetes due to short supplies. ?She denies polyphagia. ?Medication(s): none ?Lab Results  ?Component Value Date  ? HGBA1C 5.3 06/09/2021  ? ?Lab Results   ?Component Value Date  ? INSULIN 8.2 06/09/2021  ? INSULIN 10.4 12/04/2020  ? INSULIN 10.7 08/08/2020  ? INSULIN 8.2 05/07/2020  ? INSULIN 8.5 11/14/2019  ? ? ?Plan: ?Has severe constipation.  Discussed starting Victoza of briefly but she declines at this point. ? ?2. Constipation ?Brittney Ruiz notes constipation.   ? She has been taking a mixture of fiber 1 cereal, applesauce, and prune juice twice daily.  She has been told in the past to include these calories and her daily allotment.  She also takes Metamucil in the morning and MiraLAX in the p.m.. ?Constipation is poorly controlled.  ? ?Plan: ?Increase  water. ?Increase MiraLAX to twice daily.  May continue cereal, applesauce, prune juice mixture as well as Metamucil in the morning. ? ?Obesity: Current BMI 34.19 ?Brittney Ruiz is currently in the action stage of change. As such, her goal is to continue with weight loss efforts.  ?She has agreed to the Category 2 Plan.  ?Switch to category 2 plan for more structure and less calories overall. ?IC and fasting labs next office visit.  Patient was sent a MyChart message telling her to arrive 30 minutes early. ? ?Exercise goals: Continue current regimen ? ?Behavioral modification strategies: increasing lean protein intake and increasing water intake. ? ?Brittney Ruiz has agreed to follow-up with our clinic in 4 weeks.  ? ?No orders of the defined types were placed in this encounter. ? ? ?There are no discontinued medications.  ? ?No orders of the defined types  were placed in this encounter. ?   ? ?Objective:  ? ?VITALS: Per patient if applicable, see vitals. ?GENERAL: Alert and in no acute distress. ?CARDIOPULMONARY: No increased WOB. Speaking in clear sentences.  ?PSYCH: Pleasant and cooperative. Speech normal rate and rhythm. Affect is appropriate. Insight and judgement are appropriate. Attention is focused, linear, and appropriate.  ?NEURO: Oriented as arrived to appointment on time with no prompting.  ? ?Lab Results  ?Component Value Date   ? CREATININE 0.71 03/03/2021  ? BUN 24 (H) 03/03/2021  ? NA 139 03/03/2021  ? K 4.1 03/03/2021  ? CL 103 03/03/2021  ? CO2 29 03/03/2021  ? ?Lab Results  ?Component Value Date  ? ALT 26 03/24/2021  ? AST 21 03/24/2021  ? ALKPHOS 68 03/24/2021  ? BILITOT 0.5 03/24/2021  ? ?Lab Results  ?Component Value Date  ? HGBA1C 5.3 06/09/2021  ? HGBA1C 5.5 12/04/2020  ? HGBA1C 5.5 08/08/2020  ? HGBA1C 5.4 05/07/2020  ? HGBA1C 5.4 11/14/2019  ? ?Lab Results  ?Component Value Date  ? INSULIN 8.2 06/09/2021  ? INSULIN 10.4 12/04/2020  ? INSULIN 10.7 08/08/2020  ? INSULIN 8.2 05/07/2020  ? INSULIN 8.5 11/14/2019  ? ?Lab Results  ?Component Value Date  ? TSH 1.48 03/03/2021  ? ?Lab Results  ?Component Value Date  ? CHOL 237 (H) 06/09/2021  ? HDL 32 (L) 06/09/2021  ? LDLCALC 182 (H) 06/09/2021  ? LDLDIRECT 141.0 05/02/2018  ? TRIG 124 06/09/2021  ? CHOLHDL 7.4 (H) 06/09/2021  ? ?Lab Results  ?Component Value Date  ? WBC 7.5 10/07/2020  ? HGB 15.4 (H) 10/07/2020  ? HCT 45.5 10/07/2020  ? MCV 86.2 10/07/2020  ? PLT 152 10/07/2020  ? ?Lab Results  ?Component Value Date  ? IRON 77 04/21/2021  ? TIBC 283 04/21/2021  ? FERRITIN 336 (H) 04/21/2021  ? ?Lab Results  ?Component Value Date  ? VD25OH 48.7 06/09/2021  ? VD25OH 58.9 12/04/2020  ? VD25OH 45.2 08/08/2020  ? ? ?Attestation Statements:  ? ?Reviewed by clinician on day of visit: allergies, medications, problem list, medical history, surgical history, family history, social history, and previous encounter notes. ? ?Time spent on visit including pre-visit chart review and post-visit charting and care was 35 minutes.  ? ? ?

## 2021-09-22 ENCOUNTER — Encounter (INDEPENDENT_AMBULATORY_CARE_PROVIDER_SITE_OTHER): Payer: Self-pay | Admitting: Family Medicine

## 2021-09-22 ENCOUNTER — Telehealth (INDEPENDENT_AMBULATORY_CARE_PROVIDER_SITE_OTHER): Payer: Medicare HMO | Admitting: Family Medicine

## 2021-09-22 ENCOUNTER — Encounter (INDEPENDENT_AMBULATORY_CARE_PROVIDER_SITE_OTHER): Payer: Self-pay

## 2021-09-22 DIAGNOSIS — E669 Obesity, unspecified: Secondary | ICD-10-CM | POA: Diagnosis not present

## 2021-09-22 DIAGNOSIS — R7303 Prediabetes: Secondary | ICD-10-CM | POA: Diagnosis not present

## 2021-09-22 DIAGNOSIS — K5909 Other constipation: Secondary | ICD-10-CM | POA: Diagnosis not present

## 2021-09-22 DIAGNOSIS — Z6834 Body mass index (BMI) 34.0-34.9, adult: Secondary | ICD-10-CM | POA: Diagnosis not present

## 2021-09-30 ENCOUNTER — Ambulatory Visit (INDEPENDENT_AMBULATORY_CARE_PROVIDER_SITE_OTHER): Payer: Medicare HMO | Admitting: Physician Assistant

## 2021-10-03 NOTE — Progress Notes (Signed)
Added to appt notes

## 2021-10-20 ENCOUNTER — Ambulatory Visit (INDEPENDENT_AMBULATORY_CARE_PROVIDER_SITE_OTHER): Payer: Medicare HMO | Admitting: Nurse Practitioner

## 2021-10-20 NOTE — Telephone Encounter (Signed)
Appointment change request.

## 2021-10-21 ENCOUNTER — Other Ambulatory Visit: Payer: Self-pay | Admitting: Family

## 2021-10-21 ENCOUNTER — Ambulatory Visit (INDEPENDENT_AMBULATORY_CARE_PROVIDER_SITE_OTHER): Payer: Medicare HMO | Admitting: Nurse Practitioner

## 2021-10-27 ENCOUNTER — Ambulatory Visit (INDEPENDENT_AMBULATORY_CARE_PROVIDER_SITE_OTHER): Payer: Medicare HMO | Admitting: Nurse Practitioner

## 2021-10-29 ENCOUNTER — Encounter (INDEPENDENT_AMBULATORY_CARE_PROVIDER_SITE_OTHER): Payer: Self-pay | Admitting: Nurse Practitioner

## 2021-10-29 ENCOUNTER — Ambulatory Visit (INDEPENDENT_AMBULATORY_CARE_PROVIDER_SITE_OTHER): Payer: Medicare HMO | Admitting: Nurse Practitioner

## 2021-10-29 VITALS — BP 116/67 | HR 60 | Temp 97.5°F | Ht 62.0 in | Wt 187.0 lb

## 2021-10-29 DIAGNOSIS — Z6834 Body mass index (BMI) 34.0-34.9, adult: Secondary | ICD-10-CM | POA: Diagnosis not present

## 2021-10-29 DIAGNOSIS — E669 Obesity, unspecified: Secondary | ICD-10-CM

## 2021-10-29 DIAGNOSIS — R0609 Other forms of dyspnea: Secondary | ICD-10-CM

## 2021-10-29 DIAGNOSIS — E8881 Metabolic syndrome: Secondary | ICD-10-CM

## 2021-10-30 NOTE — Progress Notes (Signed)
Chief Complaint:   OBESITY Brittney Ruiz is here to discuss her progress with her obesity treatment plan along with follow-up of her obesity related diagnoses. Brittney Ruiz is on the Category 2 Plan and keeping a food journal and adhering to recommended goals of 1200-1400 calories and 92 grams of protein and states she is following her eating plan approximately 85% of the time. Brittney Ruiz states she is doing water aerobics 15 minutes 2-3 times per week.  Today's visit was #: 2 Starting weight: 217 lbs Starting date: 06/01/2019 Today's weight: 187 lbs Today's date: 10/29/2021 Total lbs lost to date: 30 lbs Total lbs lost since last in-office visit: 0  Interim History: Brittney Ruiz has maintained her weight since last visit. She has not lost weight since April 2022. Her highest weight was 234 lbs. She has not been tracking calories since being out of town for the past 3 weeks. She has started up a new program since June 3rd called [facility], seeing an RN there. The RN is telling her what to do/eat. They are trying to get her to eat whole foods and not processed proceed foods. For breakfast, oatmeal with protein provided (unsure of how much protein in the powder). For lunch, 1 cup of green veggies, 1/4 of carrots, 1/4 of cucumbers and 1/4 of blueberries, chicken breast, 2 tbsp of Skinny girl + sometimes cheese. For snack, protein shake with strawberries, Chia seeds, Stevia and unsweetened Almond milk. For dinner, protein and veggies.   Subjective:   1. Insulin resistance Brittney Ruiz reports hunger when eating 1200 calories + has not been able to stay at 1200 calories. She does better when she eats more calories but not losing weight.  2. Dyspnea on exertion Brittney Ruiz's last IC was 05/07/20 at [facility]. Her IC today was at 23.  Assessment/Plan:   1. Insulin resistance Handout given for, Insulin resistance and Metformin. She is not interested in starting any medications.  2. Dyspnea on exertion Brittney Ruiz's metabolism has  decreased. I have asked her to track and journal strictly until her next visit.  3. Obesity with current BMI of 34.3 Brittney Ruiz is currently in the action stage of change. As such, her goal is to continue with weight loss efforts. She has agreed to keeping a food journal and adhering to recommended goals of 1300 calories and 90+ grams of protein.   Exercise goals: All adults should avoid inactivity. Some physical activity is better than none, and adults who participate in any amount of physical activity gain some health benefits.  Behavioral modification strategies: increasing lean protein intake, no skipping meals, meal planning and cooking strategies, and planning for success.  Brittney Ruiz has agreed to follow-up with our clinic in 3 weeks. She was informed of the importance of frequent follow-up visits to maximize her success with intensive lifestyle modifications for her multiple health conditions.   Objective:   Blood pressure 116/67, pulse 60, temperature (!) 97.5 F (36.4 C), height '5\' 2"'$  (1.575 m), weight 187 lb (84.8 kg), SpO2 99 %. Body mass index is 34.2 kg/m.  General: Cooperative, alert, well developed, in no acute distress. HEENT: Conjunctivae and lids unremarkable. Cardiovascular: Regular rhythm.  Lungs: Normal work of breathing. Neurologic: No focal deficits.   Lab Results  Component Value Date   CREATININE 0.71 03/03/2021   BUN 24 (H) 03/03/2021   NA 139 03/03/2021   K 4.1 03/03/2021   CL 103 03/03/2021   CO2 29 03/03/2021   Lab Results  Component Value Date  ALT 26 03/24/2021   AST 21 03/24/2021   ALKPHOS 68 03/24/2021   BILITOT 0.5 03/24/2021   Lab Results  Component Value Date   HGBA1C 5.3 06/09/2021   HGBA1C 5.5 12/04/2020   HGBA1C 5.5 08/08/2020   HGBA1C 5.4 05/07/2020   HGBA1C 5.4 11/14/2019   Lab Results  Component Value Date   INSULIN 8.2 06/09/2021   INSULIN 10.4 12/04/2020   INSULIN 10.7 08/08/2020   INSULIN 8.2 05/07/2020   INSULIN 8.5 11/14/2019    Lab Results  Component Value Date   TSH 1.48 03/03/2021   Lab Results  Component Value Date   CHOL 237 (H) 06/09/2021   HDL 32 (L) 06/09/2021   LDLCALC 182 (H) 06/09/2021   LDLDIRECT 141.0 05/02/2018   TRIG 124 06/09/2021   CHOLHDL 7.4 (H) 06/09/2021   Lab Results  Component Value Date   VD25OH 48.7 06/09/2021   VD25OH 58.9 12/04/2020   VD25OH 45.2 08/08/2020   Lab Results  Component Value Date   WBC 7.5 10/07/2020   HGB 15.4 (H) 10/07/2020   HCT 45.5 10/07/2020   MCV 86.2 10/07/2020   PLT 152 10/07/2020   Lab Results  Component Value Date   IRON 77 04/21/2021   TIBC 283 04/21/2021   FERRITIN 336 (H) 04/21/2021   Attestation Statements:   Reviewed by clinician on day of visit: allergies, medications, problem list, medical history, surgical history, family history, social history, and previous encounter notes.  I, Brendell Tyus, RMA, am acting as transcriptionist for Everardo Pacific, FNP..  I have reviewed the above documentation for accuracy and completeness, and I agree with the above. Everardo Pacific, FNP

## 2021-11-06 DIAGNOSIS — Z7982 Long term (current) use of aspirin: Secondary | ICD-10-CM | POA: Diagnosis not present

## 2021-11-06 DIAGNOSIS — Z6834 Body mass index (BMI) 34.0-34.9, adult: Secondary | ICD-10-CM | POA: Diagnosis not present

## 2021-11-06 DIAGNOSIS — Z8249 Family history of ischemic heart disease and other diseases of the circulatory system: Secondary | ICD-10-CM | POA: Diagnosis not present

## 2021-11-06 DIAGNOSIS — R69 Illness, unspecified: Secondary | ICD-10-CM | POA: Diagnosis not present

## 2021-11-06 DIAGNOSIS — I951 Orthostatic hypotension: Secondary | ICD-10-CM | POA: Diagnosis not present

## 2021-11-06 DIAGNOSIS — I10 Essential (primary) hypertension: Secondary | ICD-10-CM | POA: Diagnosis not present

## 2021-11-06 DIAGNOSIS — E669 Obesity, unspecified: Secondary | ICD-10-CM | POA: Diagnosis not present

## 2021-11-06 DIAGNOSIS — F329 Major depressive disorder, single episode, unspecified: Secondary | ICD-10-CM | POA: Diagnosis not present

## 2021-11-06 DIAGNOSIS — K219 Gastro-esophageal reflux disease without esophagitis: Secondary | ICD-10-CM | POA: Diagnosis not present

## 2021-11-06 DIAGNOSIS — F419 Anxiety disorder, unspecified: Secondary | ICD-10-CM | POA: Diagnosis not present

## 2021-11-06 DIAGNOSIS — Z008 Encounter for other general examination: Secondary | ICD-10-CM | POA: Diagnosis not present

## 2021-11-06 DIAGNOSIS — J449 Chronic obstructive pulmonary disease, unspecified: Secondary | ICD-10-CM | POA: Diagnosis not present

## 2021-11-06 DIAGNOSIS — Z825 Family history of asthma and other chronic lower respiratory diseases: Secondary | ICD-10-CM | POA: Diagnosis not present

## 2021-11-06 DIAGNOSIS — Z720 Tobacco use: Secondary | ICD-10-CM | POA: Diagnosis not present

## 2021-11-20 ENCOUNTER — Other Ambulatory Visit: Payer: Self-pay | Admitting: Family

## 2021-11-24 ENCOUNTER — Telehealth (INDEPENDENT_AMBULATORY_CARE_PROVIDER_SITE_OTHER): Payer: Medicare HMO | Admitting: Family Medicine

## 2021-11-24 ENCOUNTER — Ambulatory Visit (INDEPENDENT_AMBULATORY_CARE_PROVIDER_SITE_OTHER): Payer: Medicare HMO | Admitting: Nurse Practitioner

## 2021-12-02 DIAGNOSIS — F410 Panic disorder [episodic paroxysmal anxiety] without agoraphobia: Secondary | ICD-10-CM | POA: Diagnosis not present

## 2021-12-02 DIAGNOSIS — R69 Illness, unspecified: Secondary | ICD-10-CM | POA: Diagnosis not present

## 2021-12-02 DIAGNOSIS — F39 Unspecified mood [affective] disorder: Secondary | ICD-10-CM | POA: Diagnosis not present

## 2021-12-02 DIAGNOSIS — F411 Generalized anxiety disorder: Secondary | ICD-10-CM | POA: Diagnosis not present

## 2021-12-02 DIAGNOSIS — F4001 Agoraphobia with panic disorder: Secondary | ICD-10-CM | POA: Diagnosis not present

## 2021-12-04 ENCOUNTER — Ambulatory Visit (INDEPENDENT_AMBULATORY_CARE_PROVIDER_SITE_OTHER): Payer: Medicare HMO | Admitting: Nurse Practitioner

## 2021-12-04 ENCOUNTER — Encounter (INDEPENDENT_AMBULATORY_CARE_PROVIDER_SITE_OTHER): Payer: Self-pay | Admitting: Nurse Practitioner

## 2021-12-04 VITALS — BP 126/84 | HR 56 | Temp 97.8°F | Ht 62.0 in | Wt 189.0 lb

## 2021-12-04 DIAGNOSIS — E7849 Other hyperlipidemia: Secondary | ICD-10-CM

## 2021-12-04 DIAGNOSIS — Z6834 Body mass index (BMI) 34.0-34.9, adult: Secondary | ICD-10-CM | POA: Diagnosis not present

## 2021-12-04 DIAGNOSIS — E669 Obesity, unspecified: Secondary | ICD-10-CM

## 2021-12-04 NOTE — Patient Instructions (Signed)
The 10-year ASCVD risk score (Arnett DK, et al., 2019) is: 4.9%   Values used to calculate the score:     Age: 56 years     Sex: Female     Is Non-Hispanic African American: No     Diabetic: No     Tobacco smoker: No     Systolic Blood Pressure: 840 mmHg     Is BP treated: Yes     HDL Cholesterol: 32 mg/dL     Total Cholesterol: 237 mg/dL

## 2021-12-09 NOTE — Progress Notes (Signed)
Chief Complaint:   OBESITY Brittney Ruiz is here to discuss her progress with her obesity treatment plan along with follow-up of her obesity related diagnoses. Brittney Ruiz is on the Category 2 Plan and keeping a food journal and adhering to recommended goals of 1200-1400 calories and 92 grams of protein and states she is following her eating plan approximately ?% of the time. Brittney Ruiz states she is walking 20 minutes 3-4 times per week.  Today's visit was #: 30 Starting weight: 217 lbs Starting date: 06/01/2019 Today's weight: 189 lbs Today's date: 12/04/2021 Total lbs lost to date: 28 lbs Total lbs lost since last in-office visit: 0  Interim History: Brittney Ruiz has been out of town for 2 weeks. Calories: 1300-1400, protein: 105-112 grams. She has eaten out some but is trying to make healthier choices.  Has not been weighing proteins. Aiming to walk 2,000 steps per day. She has been setting a goals. Her goal for exercise is a brisk walk for 15 min per day.  Subjective:   1. Other hyperlipidemia Brittney Ruiz has seen a lipid specialist. She is unable to tolerate statins. She's also seen cardiology. ASCVD 4.9. Family history of heart disease.  Assessment/Plan:   1. Other hyperlipidemia ASCVD reviewed with patient.   Cardiovascular risk and specific lipid/LDL goals reviewed.  We discussed several lifestyle modifications today and Brittney Ruiz will continue to work on diet, exercise and weight loss efforts. Orders and follow up as documented in patient record.   Counseling Intensive lifestyle modifications are the first line treatment for this issue. Dietary changes: Increase soluble fiber. Decrease simple carbohydrates. Exercise changes: Moderate to vigorous-intensity aerobic activity 150 minutes per week if tolerated. Lipid-lowering medications: see documented in medical record.   The 10-year ASCVD risk score (Arnett DK, et al., 2019) is: 4.9%   Values used to calculate the score:     Age: 56 years     Sex: Female      Is Non-Hispanic African American: No     Diabetic: No     Tobacco smoker: No     Systolic Blood Pressure: 295 mmHg     Is BP treated: Yes     HDL Cholesterol: 32 mg/dL     Total Cholesterol: 237 mg/dL  2. Obesity with current BMI of 34.6 Brittney Ruiz is currently in the action stage of change. As such, her goal is to continue with weight loss efforts. She has agreed to keeping a food journal and adhering to recommended goals of 1200 calories and 75 grams of protein.   Exercise goals: As is.  Behavioral modification strategies: increasing lean protein intake, increasing water intake, and planning for success.  Brittney Ruiz has agreed to follow-up with our clinic in 4 weeks. She was informed of the importance of frequent follow-up visits to maximize her success with intensive lifestyle modifications for her multiple health conditions.   Objective:   Blood pressure 126/84, pulse (!) 56, temperature 97.8 F (36.6 C), height '5\' 2"'$  (1.575 m), weight 189 lb (85.7 kg), SpO2 97 %. Body mass index is 34.57 kg/m.  General: Cooperative, alert, well developed, in no acute distress. HEENT: Conjunctivae and lids unremarkable. Cardiovascular: Regular rhythm.  Lungs: Normal work of breathing. Neurologic: No focal deficits.   Lab Results  Component Value Date   CREATININE 0.71 03/03/2021   BUN 24 (H) 03/03/2021   NA 139 03/03/2021   K 4.1 03/03/2021   CL 103 03/03/2021   CO2 29 03/03/2021   Lab Results  Component Value Date  ALT 26 03/24/2021   AST 21 03/24/2021   ALKPHOS 68 03/24/2021   BILITOT 0.5 03/24/2021   Lab Results  Component Value Date   HGBA1C 5.3 06/09/2021   HGBA1C 5.5 12/04/2020   HGBA1C 5.5 08/08/2020   HGBA1C 5.4 05/07/2020   HGBA1C 5.4 11/14/2019   Lab Results  Component Value Date   INSULIN 8.2 06/09/2021   INSULIN 10.4 12/04/2020   INSULIN 10.7 08/08/2020   INSULIN 8.2 05/07/2020   INSULIN 8.5 11/14/2019   Lab Results  Component Value Date   TSH 1.48 03/03/2021    Lab Results  Component Value Date   CHOL 237 (H) 06/09/2021   HDL 32 (L) 06/09/2021   LDLCALC 182 (H) 06/09/2021   LDLDIRECT 141.0 05/02/2018   TRIG 124 06/09/2021   CHOLHDL 7.4 (H) 06/09/2021   Lab Results  Component Value Date   VD25OH 48.7 06/09/2021   VD25OH 58.9 12/04/2020   VD25OH 45.2 08/08/2020   Lab Results  Component Value Date   WBC 7.5 10/07/2020   HGB 15.4 (H) 10/07/2020   HCT 45.5 10/07/2020   MCV 86.2 10/07/2020   PLT 152 10/07/2020   Lab Results  Component Value Date   IRON 77 04/21/2021   TIBC 283 04/21/2021   FERRITIN 336 (H) 04/21/2021   Attestation Statements:   Reviewed by clinician on day of visit: allergies, medications, problem list, medical history, surgical history, family history, social history, and previous encounter notes.  Time spent on visit including pre-visit chart review and post-visit care and charting was 30 minutes.   I, Brendell Tyus, RMA, am acting as transcriptionist for Everardo Pacific, FNP.  I have reviewed the above documentation for accuracy and completeness, and I agree with the above. Everardo Pacific, FNP

## 2021-12-24 ENCOUNTER — Encounter (INDEPENDENT_AMBULATORY_CARE_PROVIDER_SITE_OTHER): Payer: Self-pay

## 2022-01-05 ENCOUNTER — Encounter (INDEPENDENT_AMBULATORY_CARE_PROVIDER_SITE_OTHER): Payer: Self-pay | Admitting: Nurse Practitioner

## 2022-01-05 ENCOUNTER — Ambulatory Visit (INDEPENDENT_AMBULATORY_CARE_PROVIDER_SITE_OTHER): Payer: Medicare HMO | Admitting: Nurse Practitioner

## 2022-01-05 VITALS — BP 118/80 | HR 60 | Temp 97.9°F | Ht 62.0 in | Wt 192.0 lb

## 2022-01-05 DIAGNOSIS — E8881 Metabolic syndrome: Secondary | ICD-10-CM

## 2022-01-05 DIAGNOSIS — E669 Obesity, unspecified: Secondary | ICD-10-CM | POA: Diagnosis not present

## 2022-01-05 DIAGNOSIS — Z6835 Body mass index (BMI) 35.0-35.9, adult: Secondary | ICD-10-CM | POA: Diagnosis not present

## 2022-01-13 NOTE — Progress Notes (Unsigned)
Chief Complaint:   OBESITY Brittney Ruiz is here to discuss her progress with her obesity treatment plan along with follow-up of her obesity related diagnoses. Brittney Ruiz is on the Category 2 Plan and states she is following her eating plan approximately 0% of the time. Brittney Ruiz states she is exercising 0 minutes 0 times per week.  Today's visit was #: 79 Starting weight: 217 lbs Starting date: 06/01/2019 Today's weight: 192 lbs Today's date: 01/05/2022 Total lbs lost to date: 25 lbs Total lbs lost since last in-office visit: 0  Interim History: Brittney Ruiz has been on vacation since her last visit. Her husband's best friend passed away. She has been trying to find a protein shake that will work for her. Calories: 1400-1600, protein: 110-115. Has several allergies and has been experimenting with foods. Focusing on making healthier habits and eating cleaner.  Subjective:   1. Insulin resistance Brittney Ruiz has never been on medication and doesn't want to be on medication.  Assessment/Plan:   1. Insulin resistance Brittney Ruiz will continue to work on weight loss, exercise, and decreasing simple carbohydrates to help decrease the risk of diabetes. Brittney Ruiz agreed to follow-up with [name] as directed to closely monitor her progress.  2. Obesity with current BMI of 35.3 Brittney Ruiz is currently in the action stage of change. As such, her goal is to continue with weight loss efforts. She has agreed to keeping a food journal and adhering to recommended goals of 1300-1400 calories and 90+ grams of protein.   Exercise goals: All adults should avoid inactivity. Some physical activity is better than none, and adults who participate in any amount of physical activity gain some health benefits.  Multiple handout given today. Will obtain labs at next visit. Behavioral modification strategies: increasing lean protein intake, increasing vegetables, and increasing water intake.  Brittney Ruiz has agreed to follow-up with our clinic in 4 weeks. She was  informed of the importance of frequent follow-up visits to maximize her success with intensive lifestyle modifications for her multiple health conditions.   Objective:   Blood pressure 118/80, pulse 60, temperature 97.9 F (36.6 C), height '5\' 2"'$  (1.575 m), weight 192 lb (87.1 kg), SpO2 99 %. Body mass index is 35.12 kg/m.  General: Cooperative, alert, well developed, in no acute distress. HEENT: Conjunctivae and lids unremarkable. Cardiovascular: Regular rhythm.  Lungs: Normal work of breathing. Neurologic: No focal deficits.   Lab Results  Component Value Date   CREATININE 0.71 03/03/2021   BUN 24 (H) 03/03/2021   NA 139 03/03/2021   K 4.1 03/03/2021   CL 103 03/03/2021   CO2 29 03/03/2021   Lab Results  Component Value Date   ALT 26 03/24/2021   AST 21 03/24/2021   ALKPHOS 68 03/24/2021   BILITOT 0.5 03/24/2021   Lab Results  Component Value Date   HGBA1C 5.3 06/09/2021   HGBA1C 5.5 12/04/2020   HGBA1C 5.5 08/08/2020   HGBA1C 5.4 05/07/2020   HGBA1C 5.4 11/14/2019   Lab Results  Component Value Date   INSULIN 8.2 06/09/2021   INSULIN 10.4 12/04/2020   INSULIN 10.7 08/08/2020   INSULIN 8.2 05/07/2020   INSULIN 8.5 11/14/2019   Lab Results  Component Value Date   TSH 1.48 03/03/2021   Lab Results  Component Value Date   CHOL 237 (H) 06/09/2021   HDL 32 (L) 06/09/2021   LDLCALC 182 (H) 06/09/2021   LDLDIRECT 141.0 05/02/2018   TRIG 124 06/09/2021   CHOLHDL 7.4 (H) 06/09/2021   Lab Results  Component Value Date   VD25OH 48.7 06/09/2021   VD25OH 58.9 12/04/2020   VD25OH 45.2 08/08/2020   Lab Results  Component Value Date   WBC 7.5 10/07/2020   HGB 15.4 (H) 10/07/2020   HCT 45.5 10/07/2020   MCV 86.2 10/07/2020   PLT 152 10/07/2020   Lab Results  Component Value Date   IRON 77 04/21/2021   TIBC 283 04/21/2021   FERRITIN 336 (H) 04/21/2021   Attestation Statements:   Reviewed by clinician on day of visit: allergies, medications, problem  list, medical history, surgical history, family history, social history, and previous encounter notes.  Time spent on visit including pre-visit chart review and post-visit care and charting was 30 minutes.   I, Brendell Tyus, RMA, am acting as transcriptionist for Everardo Pacific, FNP.  I have reviewed the above documentation for accuracy and completeness, and I agree with the above. Everardo Pacific, FNP

## 2022-01-21 DIAGNOSIS — D320 Benign neoplasm of cerebral meninges: Secondary | ICD-10-CM | POA: Diagnosis not present

## 2022-01-27 DIAGNOSIS — R0683 Snoring: Secondary | ICD-10-CM | POA: Diagnosis not present

## 2022-01-27 DIAGNOSIS — D329 Benign neoplasm of meninges, unspecified: Secondary | ICD-10-CM | POA: Diagnosis not present

## 2022-02-02 ENCOUNTER — Ambulatory Visit (INDEPENDENT_AMBULATORY_CARE_PROVIDER_SITE_OTHER): Payer: Medicare HMO | Admitting: Nurse Practitioner

## 2022-02-02 ENCOUNTER — Encounter (INDEPENDENT_AMBULATORY_CARE_PROVIDER_SITE_OTHER): Payer: Self-pay | Admitting: Nurse Practitioner

## 2022-02-02 VITALS — BP 132/86 | HR 69 | Temp 98.6°F | Ht 62.0 in | Wt 193.0 lb

## 2022-02-02 DIAGNOSIS — E8881 Metabolic syndrome: Secondary | ICD-10-CM | POA: Diagnosis not present

## 2022-02-02 DIAGNOSIS — E669 Obesity, unspecified: Secondary | ICD-10-CM

## 2022-02-02 DIAGNOSIS — E7849 Other hyperlipidemia: Secondary | ICD-10-CM | POA: Diagnosis not present

## 2022-02-02 DIAGNOSIS — Z6839 Body mass index (BMI) 39.0-39.9, adult: Secondary | ICD-10-CM

## 2022-02-02 DIAGNOSIS — Z6835 Body mass index (BMI) 35.0-35.9, adult: Secondary | ICD-10-CM | POA: Diagnosis not present

## 2022-02-03 LAB — LIPID PANEL WITH LDL/HDL RATIO
Cholesterol, Total: 216 mg/dL — ABNORMAL HIGH (ref 100–199)
HDL: 37 mg/dL — ABNORMAL LOW (ref >39)
LDL Chol Calc (NIH): 151 mg/dL — ABNORMAL HIGH (ref 0–99)
LDL/HDL Ratio: 4.1 ratio — ABNORMAL HIGH (ref 0.0–3.2)
Triglycerides: 152 mg/dL — ABNORMAL HIGH (ref 0–149)
VLDL Cholesterol Cal: 28 mg/dL (ref 5–40)

## 2022-02-03 LAB — COMPREHENSIVE METABOLIC PANEL
ALT: 33 IU/L — ABNORMAL HIGH (ref 0–32)
AST: 27 IU/L (ref 0–40)
Albumin/Globulin Ratio: 2.5 — ABNORMAL HIGH (ref 1.2–2.2)
Albumin: 4.7 g/dL (ref 3.8–4.9)
Alkaline Phosphatase: 81 IU/L (ref 44–121)
BUN/Creatinine Ratio: 32 — ABNORMAL HIGH (ref 9–23)
BUN: 20 mg/dL (ref 6–24)
Bilirubin Total: 0.5 mg/dL (ref 0.0–1.2)
CO2: 24 mmol/L (ref 20–29)
Calcium: 9.5 mg/dL (ref 8.7–10.2)
Chloride: 102 mmol/L (ref 96–106)
Creatinine, Ser: 0.63 mg/dL (ref 0.57–1.00)
Globulin, Total: 1.9 g/dL (ref 1.5–4.5)
Glucose: 96 mg/dL (ref 70–99)
Potassium: 4.5 mmol/L (ref 3.5–5.2)
Sodium: 142 mmol/L (ref 134–144)
Total Protein: 6.6 g/dL (ref 6.0–8.5)
eGFR: 105 mL/min/{1.73_m2} (ref >59)

## 2022-02-03 LAB — HEMOGLOBIN A1C
Est. average glucose Bld gHb Est-mCnc: 111 mg/dL
Hgb A1c MFr Bld: 5.5 % (ref 4.8–5.6)

## 2022-02-03 LAB — INSULIN, RANDOM: INSULIN: 16 u[IU]/mL (ref 2.6–24.9)

## 2022-02-03 LAB — CORTISOL: Cortisol: 5.4 ug/dL — ABNORMAL LOW (ref 6.2–19.4)

## 2022-02-04 DIAGNOSIS — D329 Benign neoplasm of meninges, unspecified: Secondary | ICD-10-CM | POA: Diagnosis not present

## 2022-02-04 NOTE — Progress Notes (Signed)
Chief Complaint:   OBESITY Brittney Ruiz is here to discuss her progress with her obesity treatment plan along with follow-up of her obesity related diagnoses. Brittney Ruiz is on keeping a food journal and adhering to recommended goals of 1300-1400 calories and 90 grams of protein and states she is following her eating plan approximately 65% of the time. Brittney Ruiz states she is walking 10 minutes 4 times per week.  Today's visit was #: 94 Starting weight: 217 lbs Starting date: 06/01/2019 Today's weight: 193 lbs Today's date: 02/02/2022 Total lbs lost to date: 24 lbs Total lbs lost since last in-office visit: 0  Interim History: Brittney Ruiz does not feel that she has not done well. Went to 2 different events since her last visit and she is celebrating her birthday next week. Following Cat 2+200 calories with 105 grams of protein. Limiting carbs, breakfast is, oatmeal with protein powder. Finds when she eats oatmeal she is full all day long. Has to make herself eat.  Subjective:   1. Insulin resistance Brittney Ruiz has never been on medications and does not want to be on any.  2. Other hyperlipidemia Brittney Ruiz has seen her lipid specialist. Unable to tolerate statin. She has seen cardiology. Family history of heart disease.  Assessment/Plan:   1. Insulin resistance We will obtain labs today.  Terris will continue to work on weight loss, exercise, and decreasing simple carbohydrates to help decrease the risk of diabetes. Brittney Ruiz agreed to follow-up with [name] as directed to closely monitor her progress.   - Insulin, random - Hemoglobin A1c - Comprehensive metabolic panel - Cortisol  2. Other hyperlipidemia We will obtain labs today. Cardiovascular risk and specific lipid/LDL goals reviewed.  We discussed several lifestyle modifications today and Brittney Ruiz will continue to work on diet, exercise and weight loss efforts. Orders and follow up as documented in patient record.   Counseling Intensive lifestyle modifications are the  first line treatment for this issue. Dietary changes: Increase soluble fiber. Decrease simple carbohydrates. Exercise changes: Moderate to vigorous-intensity aerobic activity 150 minutes per week if tolerated. Lipid-lowering medications: see documented in medical record.   - Comprehensive metabolic panel - Lipid Panel With LDL/HDL Ratio - Cortisol  3. Obesity with current BMI of 35.3 We will obtain labs today.  - Comprehensive metabolic panel - Cortisol  Brittney Ruiz is currently in the action stage of change. As such, her goal is to continue with weight loss efforts. She has agreed to keeping a food journal and adhering to recommended goals of 1300-1400 calories and 80+ grams of protein.   Exercise goals: As is.  Behavioral modification strategies: increasing water intake, no skipping meals, and planning for success.  Brittney Ruiz has agreed to follow-up with our clinic in 4 weeks. She was informed of the importance of frequent follow-up visits to maximize her success with intensive lifestyle modifications for her multiple health conditions.   Brittney Ruiz was informed we would discuss her lab results at her next visit unless there is a critical issue that needs to be addressed sooner. Brittney Ruiz agreed to keep her next visit at the agreed upon time to discuss these results.  Objective:   Blood pressure 132/86, pulse 69, temperature 98.6 F (37 C), height '5\' 2"'$  (1.575 m), weight 193 lb (87.5 kg), SpO2 96 %. Body mass index is 35.3 kg/m.  General: Cooperative, alert, well developed, in no acute distress. HEENT: Conjunctivae and lids unremarkable. Cardiovascular: Regular rhythm.  Lungs: Normal work of breathing. Neurologic: No focal deficits.   Lab  Results  Component Value Date   CREATININE 0.63 02/02/2022   BUN 20 02/02/2022   NA 142 02/02/2022   K 4.5 02/02/2022   CL 102 02/02/2022   CO2 24 02/02/2022   Lab Results  Component Value Date   ALT 33 (H) 02/02/2022   AST 27 02/02/2022   ALKPHOS 81  02/02/2022   BILITOT 0.5 02/02/2022   Lab Results  Component Value Date   HGBA1C 5.5 02/02/2022   HGBA1C 5.3 06/09/2021   HGBA1C 5.5 12/04/2020   HGBA1C 5.5 08/08/2020   HGBA1C 5.4 05/07/2020   Lab Results  Component Value Date   INSULIN 16.0 02/02/2022   INSULIN 8.2 06/09/2021   INSULIN 10.4 12/04/2020   INSULIN 10.7 08/08/2020   INSULIN 8.2 05/07/2020   Lab Results  Component Value Date   TSH 1.48 03/03/2021   Lab Results  Component Value Date   CHOL 216 (H) 02/02/2022   HDL 37 (L) 02/02/2022   LDLCALC 151 (H) 02/02/2022   LDLDIRECT 141.0 05/02/2018   TRIG 152 (H) 02/02/2022   CHOLHDL 7.4 (H) 06/09/2021   Lab Results  Component Value Date   VD25OH 48.7 06/09/2021   VD25OH 58.9 12/04/2020   VD25OH 45.2 08/08/2020   Lab Results  Component Value Date   WBC 7.5 10/07/2020   HGB 15.4 (H) 10/07/2020   HCT 45.5 10/07/2020   MCV 86.2 10/07/2020   PLT 152 10/07/2020   Lab Results  Component Value Date   IRON 77 04/21/2021   TIBC 283 04/21/2021   FERRITIN 336 (H) 04/21/2021   Attestation Statements:   Reviewed by clinician on day of visit: allergies, medications, problem list, medical history, surgical history, family history, social history, and previous encounter notes.  Time spent on visit including pre-visit chart review and post-visit care and charting was 50 minutes.   I, Brendell Tyus, RMA, am acting as transcriptionist for Everardo Pacific, FNP.  I have reviewed the above documentation for accuracy and completeness, and I agree with the above. Everardo Pacific, FNP

## 2022-03-04 ENCOUNTER — Ambulatory Visit: Payer: Medicare HMO | Admitting: Family

## 2022-03-04 ENCOUNTER — Ambulatory Visit (INDEPENDENT_AMBULATORY_CARE_PROVIDER_SITE_OTHER): Payer: Medicare HMO | Admitting: Family

## 2022-03-04 ENCOUNTER — Ambulatory Visit (INDEPENDENT_AMBULATORY_CARE_PROVIDER_SITE_OTHER): Payer: Medicare HMO

## 2022-03-04 ENCOUNTER — Encounter: Payer: Self-pay | Admitting: Family

## 2022-03-04 VITALS — BP 130/78 | HR 69 | Temp 98.5°F | Ht 62.0 in | Wt 198.6 lb

## 2022-03-04 DIAGNOSIS — T466X5A Adverse effect of antihyperlipidemic and antiarteriosclerotic drugs, initial encounter: Secondary | ICD-10-CM | POA: Diagnosis not present

## 2022-03-04 DIAGNOSIS — G8929 Other chronic pain: Secondary | ICD-10-CM

## 2022-03-04 DIAGNOSIS — Z1231 Encounter for screening mammogram for malignant neoplasm of breast: Secondary | ICD-10-CM | POA: Diagnosis not present

## 2022-03-04 DIAGNOSIS — M5442 Lumbago with sciatica, left side: Secondary | ICD-10-CM

## 2022-03-04 DIAGNOSIS — M5441 Lumbago with sciatica, right side: Secondary | ICD-10-CM | POA: Diagnosis not present

## 2022-03-04 DIAGNOSIS — M791 Myalgia, unspecified site: Secondary | ICD-10-CM

## 2022-03-04 DIAGNOSIS — Z0000 Encounter for general adult medical examination without abnormal findings: Secondary | ICD-10-CM | POA: Diagnosis not present

## 2022-03-04 DIAGNOSIS — E782 Mixed hyperlipidemia: Secondary | ICD-10-CM | POA: Diagnosis not present

## 2022-03-04 DIAGNOSIS — Z23 Encounter for immunization: Secondary | ICD-10-CM | POA: Diagnosis not present

## 2022-03-04 DIAGNOSIS — M4316 Spondylolisthesis, lumbar region: Secondary | ICD-10-CM | POA: Diagnosis not present

## 2022-03-04 DIAGNOSIS — M545 Low back pain, unspecified: Secondary | ICD-10-CM | POA: Diagnosis not present

## 2022-03-04 NOTE — Progress Notes (Signed)
Subjective:    Patient ID: Brittney Ruiz, female    DOB: 06-14-1965, 56 y.o.   MRN: [id number]  CC: Brittney Ruiz is a 56 y.o. female who presents today for physical exam.    HPI: Complains of low back pain x 4 weeks, unchanged. Worse with recent weight gain.  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     Severe low back pain bilateral with associated posterior leg pain to feet. She has episodic posterior leg numbness from buttocks to feet.  Recent history of plantar fasciitis Following with podiatry  She is using ice, tens, ibuprofen without relief.  No groin pain, saddle anesthesia, trouble urinating or having a bowel movement  She has been going to chiropractor for 3 weeks with temporarily relief.   Allergic to prednisone, gabapentin.   09/18/20 Lumbar spine showed trace anterolisthesis of L4 versus L5.  Mild Multilevel degenerative disc disease    She has tried lipitor, crestor, zetia due to myalgia. She had myalgia and trouble sleeping on repatha.   CT calcium score 07/13/2018 calcium score of 0; ordered by Christell Faith  Colorectal Cancer Screening: UTD , Dr Bonna Gains, repeat in 5 years Breast Cancer Screening: Mammogram UTD Cervical Cancer Screening: UTD, negative malignancy, HPV 03/06/2020 Bone Health screening/DEXA for 65+: No increased fracture risk. Defer screening at this time.  Lung Cancer Screening: UTD 07/28/21        Tetanus - UTD        Labs: Screening labs done with healthy wellness.  Exercise: No regular exercise at this time. Previously walking 10 min/day  Alcohol use:  none Smoking/tobacco use: former smoker, quit 2008      HISTORY:  Past Medical History:  Diagnosis Date   Abnormal Pap smear of cervix    h/o LEEP   Allergy    Angio-edema    Anxiety    Asthma    Bladder leak    Cancer ([place])    melanoma 2003   Chest pain    Constipation    COPD (chronic obstructive pulmonary disease) (HCC)    Depression     Diarrhea    Food allergy    Heart valve problem    Heartburn    History of swelling of feet    Hyperlipidemia    Hypertension    IBS (irritable bowel syndrome)    Joint pain    Low back pain    Melanoma ([place])    right foot s/p lymph node removed right groin   Obesity (BMI 30-39.9)    Palpitation    Prediabetes    Shortness of breath    Urticaria    Vaginal delivery    X 2    Past Surgical History:  Procedure Laterality Date   BLADDER SUSPENSION  07/2017   COLONOSCOPY WITH PROPOFOL N/A 07/01/2017   Procedure: COLONOSCOPY WITH PROPOFOL;  Surgeon: Virgel Manifold, MD;  Location: [facility];  Service: Endoscopy;  Laterality: N/A;   ESOPHAGOGASTRODUODENOSCOPY (EGD) WITH PROPOFOL N/A 07/01/2017   Procedure: ESOPHAGOGASTRODUODENOSCOPY (EGD) WITH PROPOFOL;  Surgeon: Virgel Manifold, MD;  Location: [facility] END[facility]COPY;  Service: Endoscopy;  Laterality: N/A;   LEEP  1995   LEEP     PARTIAL HYSTERECTOMY  08/2017   s/p partial hysterectomy - per patient has fallopian tubes; ovaries INTACT. NO CERVIX on exam 03/06/20   SKIN CANCER EXCISION     melanoma   TONSILLECTOMY     Family History  Problem  Relation Age of Onset   Hyperlipidemia Mother    Hypertension Mother    Diabetes Mother    Breast cancer Mother 47   Stroke Mother    Cancer Mother    Cancer Father        lung   Depression Father    Diabetes Father    Hypertension Father    Hyperlipidemia Father    Sleep apnea Father    Alcoholism Father    Hyperlipidemia Sister    Hypertension Sister    Migraines Sister    Hypertension Brother    Hyperlipidemia Brother    Migraines Brother    Cancer Maternal Grandmother        glioblastoma   Depression Maternal Grandfather    Heart disease Maternal Grandfather    Cancer Paternal Grandfather        unknown   AAA (abdominal aortic aneurysm) Paternal Grandfather       ALLERGIES: Elemental sulfur, Pork allergy, Pork-derived products, Aciphex [rabeprazole  sodium], Antivert [meclizine hcl], Buspar [buspirone hcl], Cat hair extract, Chlorpheniramine-pseudoeph, Chlorpheniramine-pseudoeph, Chocolate flavor, Codeine, Crestor [rosuvastatin calcium], Cymbalta [duloxetine hcl], Duloxetine, Effexor xr [venlafaxine hydrochloride], Epinephrine, Erythromycin, Escitalopram oxalate, Lamictal [lamotrigine], Levofloxacin, Lipitor [atorvastatin], Meclizine, Mometasone furoate, Mometasone furoate, Monosodium glutamate, Neurontin [gabapentin], Other, Prednisone, Red yeast rice extract, Shellfish allergy, Sudafed [pseudoephedrine hcl], Sulfa antibiotics, Sulfasalazine, Valium, Wellbutrin [bupropion hcl], Wixela inhub [fluticasone-salmeterol], Xanax xr [alprazolam], Zetia [ezetimibe], Alfalfa, Brompheniramine-phenylephrine, Buspirone, and Tape  Current Outpatient Medications on File Prior to Visit  Medication Sig Dispense Refill   albuterol (VENTOLIN HFA) 108 (90 Base) MCG/ACT inhaler USE 2 INHALATIONS ORALLY   EVERY 6 HOURS AS NEEDED. 18 g 1   Alcohol Swabs (B-D SINGLE USE SWABS REGULAR) PADS USE 1 SWAB TOPICALLY TO    CLEAN FINGER BEFORE        CHECKING GLUCOSE 300 each 1   Ascorbic Acid (VITAMIN C) 1000 MG tablet Take 1,000 mg by mouth daily.     aspirin EC 81 MG tablet Take 81 mg by mouth at bedtime.     B Complex Vitamins (BL VITAMIN B COMPLEX PO) +Folic acid qd.     Blood Glucose Monitoring Suppl (TRUE METRIX METER) w/Device KIT USED TO CHECK GLUCOSE READINGS  ONE TIME DAILY AS NEEDED. 1 kit 0   Calcium Carb-Cholecalciferol (CALCIUM 600/VITAMIN D PO) Take 1 tablet by mouth daily.     Cholecalciferol (VITAMIN D-3) 25 MCG (1000 UT) CAPS Take 1 capsule by mouth daily.     clorazepate (TRANXENE) 7.5 MG tablet Take 7.5 mg by mouth 3 (three) times daily.     fexofenadine (ALLEGRA) 60 MG tablet Take 1 tablet (60 mg total) by mouth daily. 90 tablet 1   fluticasone (FLONASE) 50 MCG/ACT nasal spray USE 1 SPRAY IN EACH NOSTRILAT BEDTIME 16 g 2   glucose blood (TRUE METRIX BLOOD  GLUCOSE TEST) test strip TEST BLOOD SUGAR ONE TIME DAILY 100 strip 0   Multiple Vitamin (MULTIVITAMIN) tablet Take 1 tablet by mouth daily.     Omega-3 Fatty Acids (FISH OIL) 1000 MG CAPS Take 1 capsule by mouth daily.     omeprazole (PRILOSEC) 20 MG capsule TAKE 1 CAPSULE DAILY 90 capsule 1   polyethylene glycol powder (GLYCOLAX/MIRALAX) 17 GM/SCOOP powder Take 1 Container by mouth at bedtime.     propranolol (INDERAL) 20 MG tablet Take 1 tablet (20 mg total) by mouth 3 (three) times daily. 90 tablet 3   psyllium (METAMUCIL) 58.6 % powder Take 1 packet by mouth daily.  sertraline (ZOLOFT) 100 MG tablet TAKE 1+1/2 TABLETS BY MOUTH DAILY AS DIRECTED (Patient taking differently: Take 112.5 mg by mouth See admin instructions. 112.5 in the morning and 25 mg in the evening) 45 tablet 3   sertraline (ZOLOFT) 25 MG tablet Take 25 mg by mouth every evening.      TRUEplus Lancets 33G MISC TEST BLOOD SUGAR ONE TIME DAILY 100 each 0   No current facility-administered medications on file prior to visit.    Social History   Tobacco Use   Smoking status: Former    Packs/day: 1.00    Years: 25.00    Total pack years: 25.00    Types: Cigarettes    Quit date: 2008    Years since quitting: 15.8   Smokeless tobacco: Never  Vaping Use   Vaping Use: Never used  Substance Use Topics   Alcohol use: No   Drug use: No    Review of Systems  Constitutional:  Negative for chills, fever and unexpected weight change.  HENT:  Negative for congestion.   Respiratory:  Negative for cough.   Cardiovascular:  Negative for chest pain, palpitations and leg swelling.  Gastrointestinal:  Negative for nausea and vomiting.  Musculoskeletal:  Positive for back pain. Negative for arthralgias and myalgias.  Skin:  Negative for rash.  Neurological:  Positive for numbness. Negative for headaches.  Hematological:  Negative for adenopathy.  Psychiatric/Behavioral:  Negative for confusion.       Objective:    BP  130/78 (BP Location: Left Arm, Patient Position: Sitting, Cuff Size: Normal)   Pulse 69   Temp 98.5 F (36.9 C) (Oral)   Ht 5\' 2"  (1.575 m)   Wt 198 lb 9.6 oz (90.1 kg)   LMP  (LMP Unknown)   SpO2 96%   BMI 36.32 kg/m   BP Readings from Last 3 Encounters:  03/04/22 130/78  02/02/22 132/86  01/05/22 118/80   Wt Readings from Last 3 Encounters:  03/04/22 198 lb 9.6 oz (90.1 kg)  02/02/22 193 lb (87.5 kg)  01/05/22 192 lb (87.1 kg)    Physical Exam Vitals reviewed.  Constitutional:      Appearance: Normal appearance. She is well-developed.  Eyes:     Conjunctiva/sclera: Conjunctivae normal.  Neck:     Thyroid: No thyroid mass or thyromegaly.  Cardiovascular:     Rate and Rhythm: Normal rate and regular rhythm.     Pulses: Normal pulses.     Heart sounds: Normal heart sounds.  Pulmonary:     Effort: Pulmonary effort is normal.     Breath sounds: Normal breath sounds. No wheezing, rhonchi or rales.  Chest:  Breasts:    Breasts are symmetrical.     Right: No inverted nipple, mass, nipple discharge, skin change or tenderness.     Left: No inverted nipple, mass, nipple discharge, skin change or tenderness.  Abdominal:     General: Bowel sounds are normal. There is no distension.     Palpations: Abdomen is soft. Abdomen is not rigid. There is no fluid wave or mass.     Tenderness: There is no abdominal tenderness. There is no guarding or rebound.  Musculoskeletal:     Lumbar back: No swelling, edema, spasms, tenderness or bony tenderness. Normal range of motion.       Back:     Comments: Full range of motion with flexion, tension, lateral side bends. No bony tenderness. Low back pain without  numbness or tingling elicited with single leg  Subjective:    Patient ID: Brittney Ruiz, female    DOB: 06-14-1965, 56 y.o.   MRN: [id number]  CC: Brittney Ruiz is a 56 y.o. female who presents today for physical exam.    HPI: Complains of low back pain x 4 weeks, unchanged. Worse with recent weight gain.  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     Severe low back pain bilateral with associated posterior leg pain to feet. She has episodic posterior leg numbness from buttocks to feet.  Recent history of plantar fasciitis Following with podiatry  She is using ice, tens, ibuprofen without relief.  No groin pain, saddle anesthesia, trouble urinating or having a bowel movement  She has been going to chiropractor for 3 weeks with temporarily relief.   Allergic to prednisone, gabapentin.   09/18/20 Lumbar spine showed trace anterolisthesis of L4 versus L5.  Mild Multilevel degenerative disc disease    She has tried lipitor, crestor, zetia due to myalgia. She had myalgia and trouble sleeping on repatha.   CT calcium score 07/13/2018 calcium score of 0; ordered by Christell Faith  Colorectal Cancer Screening: UTD , Dr Bonna Gains, repeat in 5 years Breast Cancer Screening: Mammogram UTD Cervical Cancer Screening: UTD, negative malignancy, HPV 03/06/2020 Bone Health screening/DEXA for 65+: No increased fracture risk. Defer screening at this time.  Lung Cancer Screening: UTD 07/28/21        Tetanus - UTD        Labs: Screening labs done with healthy wellness.  Exercise: No regular exercise at this time. Previously walking 10 min/day  Alcohol use:  none Smoking/tobacco use: former smoker, quit 2008      HISTORY:  Past Medical History:  Diagnosis Date   Abnormal Pap smear of cervix    h/o LEEP   Allergy    Angio-edema    Anxiety    Asthma    Bladder leak    Cancer ([place])    melanoma 2003   Chest pain    Constipation    COPD (chronic obstructive pulmonary disease) (HCC)    Depression     Diarrhea    Food allergy    Heart valve problem    Heartburn    History of swelling of feet    Hyperlipidemia    Hypertension    IBS (irritable bowel syndrome)    Joint pain    Low back pain    Melanoma ([place])    right foot s/p lymph node removed right groin   Obesity (BMI 30-39.9)    Palpitation    Prediabetes    Shortness of breath    Urticaria    Vaginal delivery    X 2    Past Surgical History:  Procedure Laterality Date   BLADDER SUSPENSION  07/2017   COLONOSCOPY WITH PROPOFOL N/A 07/01/2017   Procedure: COLONOSCOPY WITH PROPOFOL;  Surgeon: Virgel Manifold, MD;  Location: [facility];  Service: Endoscopy;  Laterality: N/A;   ESOPHAGOGASTRODUODENOSCOPY (EGD) WITH PROPOFOL N/A 07/01/2017   Procedure: ESOPHAGOGASTRODUODENOSCOPY (EGD) WITH PROPOFOL;  Surgeon: Virgel Manifold, MD;  Location: [facility];  Service: Endoscopy;  Laterality: N/A;   LEEP  1995   LEEP     PARTIAL HYSTERECTOMY  08/2017   s/p partial hysterectomy - per patient has fallopian tubes; ovaries INTACT. NO CERVIX on exam 03/06/20   SKIN CANCER EXCISION     melanoma   TONSILLECTOMY     Family History  Problem  given to patient on AVS.   Continue to follow with Allegra Grana, FNP for routine health maintenance.   [name] and I agreed with plan.   Rennie Plowman, FNP

## 2022-03-04 NOTE — Patient Instructions (Addendum)
May trial azelastine nasal spray instead of flonase for allergies   Referral Dr Leanord Hawking  Let us know if you dont hear back within a week in regards to an appointment being scheduled.   So that you are aware, if you are [facility] MyChart user , please pay attention to your MyChart messages as you may receive a MyChart message with a phone number to call and schedule this test/appointment own your own from our referral coordinator. This is a new process so I do not want you to miss this message.  If you are not a MyChart user, you will receive a phone call.   Health Maintenance for Postmenopausal Women Menopause is a normal process in which your ability to get pregnant comes to an end. This process happens slowly over many months or years, usually between the ages of 33 and 69. Menopause is complete when you have missed your menstrual period for 12 months. It is important to talk with your health care provider about some of the most common conditions that affect women after menopause (postmenopausal women). These include heart disease, cancer, and bone loss (osteoporosis). Adopting a healthy lifestyle and getting preventive care can help to promote your health and wellness. The actions you take can also lower your chances of developing some of these common conditions. What are the signs and symptoms of menopause? During menopause, you may have the following symptoms: Hot flashes. These can be moderate or severe. Night sweats. Decrease in sex drive. Mood swings. Headaches. Tiredness (fatigue). Irritability. Memory problems. Problems falling asleep or staying asleep. Talk with your health care provider about treatment options for your symptoms. Do I need hormone replacement therapy? Hormone replacement therapy is effective in treating symptoms that are caused by menopause, such as hot flashes and night sweats. Hormone replacement carries certain risks, especially as you become older. If you are  thinking about using estrogen or estrogen with progestin, discuss the benefits and risks with your health care provider. How can I reduce my risk for heart disease and stroke? The risk of heart disease, heart attack, and stroke increases as you age. One of the causes may be a change in the body's hormones during menopause. This can affect how your body uses dietary fats, triglycerides, and cholesterol. Heart attack and stroke are medical emergencies. There are many things that you can do to help prevent heart disease and stroke. Watch your blood pressure High blood pressure causes heart disease and increases the risk of stroke. This is more likely to develop in people who have high blood pressure readings or are overweight. Have your blood pressure checked: Every 3-5 years if you are 43-55 years of age. Every year if you are 79 years old or older. Eat a healthy diet  Eat a diet that includes plenty of vegetables, fruits, low-fat dairy products, and lean protein. Do not eat a lot of foods that are high in solid fats, added sugars, or sodium. Get regular exercise Get regular exercise. This is one of the most important things you can do for your health. Most adults should: Try to exercise for at least 150 minutes each week. The exercise should increase your heart rate and make you sweat (moderate-intensity exercise). Try to do strengthening exercises at least twice each week. Do these in addition to the moderate-intensity exercise. Spend less time sitting. Even light physical activity can be beneficial. Other tips Work with your health care provider to achieve or maintain a healthy weight. Do not  use any products that contain nicotine or tobacco. These products include cigarettes, chewing tobacco, and vaping devices, such as e-cigarettes. If you need help quitting, ask your health care provider. Know your numbers. Ask your health care provider to check your cholesterol and your blood sugar  (glucose). Continue to have your blood tested as directed by your health care provider. Do I need screening for cancer? Depending on your health history and family history, you may need to have cancer screenings at different stages of your life. This may include screening for: Breast cancer. Cervical cancer. Lung cancer. Colorectal cancer. What is my risk for osteoporosis? After menopause, you may be at increased risk for osteoporosis. Osteoporosis is a condition in which bone destruction happens more quickly than new bone creation. To help prevent osteoporosis or the bone fractures that can happen because of osteoporosis, you may take the following actions: If you are 97-50 years old, get at least 1,000 mg of calcium and at least 600 international units (IU) of vitamin D per day. If you are older than age 82 but younger than age 70, get at least 1,200 mg of calcium and at least 600 international units (IU) of vitamin D per day. If you are older than age 8, get at least 1,200 mg of calcium and at least 800 international units (IU) of vitamin D per day. Smoking and drinking excessive alcohol increase the risk of osteoporosis. Eat foods that are rich in calcium and vitamin D, and do weight-bearing exercises several times each week as directed by your health care provider. How does menopause affect my mental health? Depression may occur at any age, but it is more common as you become older. Common symptoms of depression include: Feeling depressed. Changes in sleep patterns. Changes in appetite or eating patterns. Feeling an overall lack of motivation or enjoyment of activities that you previously enjoyed. Frequent crying spells. Talk with your health care provider if you think that you are experiencing any of these symptoms. General instructions See your health care provider for regular wellness exams and vaccines. This may include: Scheduling regular health, dental, and eye exams. Getting and  maintaining your vaccines. These include: Influenza vaccine. Get this vaccine each year before the flu season begins. Pneumonia vaccine. Shingles vaccine. Tetanus, diphtheria, and pertussis (Tdap) booster vaccine. Your health care provider may also recommend other immunizations. Tell your health care provider if you have ever been abused or do not feel safe at home. Summary Menopause is a normal process in which your ability to get pregnant comes to an end. This condition causes hot flashes, night sweats, decreased interest in sex, mood swings, headaches, or lack of sleep. Treatment for this condition may include hormone replacement therapy. Take actions to keep yourself healthy, including exercising regularly, eating a healthy diet, watching your weight, and checking your blood pressure and blood sugar levels. Get screened for cancer and depression. Make sure that you are up to date with all your vaccines. This information is not intended to replace advice given to you by your health care provider. Make sure you discuss any questions you have with your health care provider. Document Revised: 09/23/2020 Document Reviewed: 09/23/2020 Elsevier Patient Education  [employer].

## 2022-03-04 NOTE — Assessment & Plan Note (Signed)
Uncontrolled, exacerbated recently.  Failed conservative therapy including ibuprofen,  icing.  Unfortunately she is intolerant to prednisone and gabapentin.  I have placed a referral to Dr. Leanord Hawking and also ordered lumbar plain film to see if anterolisthesis or degenerative disc disease has progressed.

## 2022-03-04 NOTE — Assessment & Plan Note (Signed)
Mammogram up today.  Ordered and patient will schedule for December.  Deferred pelvic exam yes of complaints and Pap smear is up-to-date.  Colonoscopy is up-to-date.  Congratulated patient on diligence to weight loss.  Unfortunately due to back pain currently, she is unable to exercise so hopefully once back pain improves, she will begin to start walking again.

## 2022-03-04 NOTE — Assessment & Plan Note (Addendum)
The 10-year ASCVD risk score (Brittney Ruiz DK, et al., 2019) is: 4.3%   She has tried lipitor, crestor, zetia due to myalgia. She had myalgia and trouble sleeping on repatha. CT calcium score 07/13/2018 calcium score of 0; ordered by Christell Faith.  Discussed that we will continue to follow cholesterol and pleased to see LDL cholesterol has decreased from prior with weight loss.

## 2022-03-07 ENCOUNTER — Encounter (INDEPENDENT_AMBULATORY_CARE_PROVIDER_SITE_OTHER): Payer: Self-pay

## 2022-03-09 ENCOUNTER — Encounter (INDEPENDENT_AMBULATORY_CARE_PROVIDER_SITE_OTHER): Payer: Self-pay | Admitting: Family Medicine

## 2022-03-09 ENCOUNTER — Ambulatory Visit (INDEPENDENT_AMBULATORY_CARE_PROVIDER_SITE_OTHER): Payer: Medicare HMO | Admitting: Family Medicine

## 2022-03-09 VITALS — BP 116/81 | HR 65 | Temp 98.0°F | Ht 62.0 in | Wt 197.0 lb

## 2022-03-09 DIAGNOSIS — E66812 Obesity, class 2: Secondary | ICD-10-CM

## 2022-03-09 DIAGNOSIS — F32A Depression, unspecified: Secondary | ICD-10-CM | POA: Diagnosis not present

## 2022-03-09 DIAGNOSIS — E7849 Other hyperlipidemia: Secondary | ICD-10-CM

## 2022-03-09 DIAGNOSIS — K760 Fatty (change of) liver, not elsewhere classified: Secondary | ICD-10-CM

## 2022-03-09 DIAGNOSIS — F419 Anxiety disorder, unspecified: Secondary | ICD-10-CM

## 2022-03-09 DIAGNOSIS — E669 Obesity, unspecified: Secondary | ICD-10-CM

## 2022-03-09 DIAGNOSIS — Z6836 Body mass index (BMI) 36.0-36.9, adult: Secondary | ICD-10-CM

## 2022-03-09 DIAGNOSIS — R69 Illness, unspecified: Secondary | ICD-10-CM | POA: Diagnosis not present

## 2022-03-18 NOTE — Progress Notes (Signed)
Chief Complaint:   OBESITY Brittney Ruiz is here to discuss her progress with her obesity treatment plan along with follow-up of her obesity related diagnoses. Brittney Ruiz is on keeping a food journal and adhering to recommended goals of 1300-1400 calories and 80 protein and states she is following her eating plan approximately 70% of the time. Brittney Ruiz states she is not exercising.   Today's visit was #: 24 Starting weight: 217 lbs Starting date: 06/01/2019 Today's weight: 197 lbs Today's date: 03/09/2022 Total lbs lost to date: 20 lbs Total lbs lost since last in-office visit: +4 lbs  Interim History: She is still struggling to eat on a schedule, not hungry in the morning.  She eats 3 meals until later.  Her hunger is well controlled between 1400-1500 calories per day.  Fairly consistent with logging intake on my fitness pal.  Back pain has limited exercise.   Subjective:   1. Other hyperlipidemia Total cholesterol 216, triglycerides 152, LDL 151.  History of statin intolerance.  Started an omega 3 fish oil supplement.    2. Hepatic steatosis ALT 33, mildly elevated on most recent labs.  RUQ ultrasound 03/19/2021, confirms diagnosis of hepatic steatosis. Visceral fat rating of 13.   3. Anxiety and depression Denies stress eating.  She is being managed by psych on Tranxene and Zoloft.  Assessment/Plan:   1. Other hyperlipidemia Continue a heart healthy diet.   2. Hepatic steatosis Continue active plan for weight loss.   3. Anxiety and depression Continue current medications and practice mindful eating.   4. Obesity,current BMI 36.1 1) Reviewed her most recent IC 10/29/2021.  REE 1699 calories/day. 2) Reviewed net progress 20 lb weight loss since January 2021.  3) Add in 2 healthy fat servings per day.   [name] is currently in the action stage of change. As such, her goal is to continue with weight loss efforts. She has agreed to keeping a food journal and adhering to recommended goals of  1400-1500 calories and 100 protein daily.   Exercise goals: All adults should avoid inactivity. Some physical activity is better than none, and adults who participate in any amount of physical activity gain some health benefits.  Behavioral modification strategies: increasing lean protein intake, increasing vegetables, increasing water intake, decreasing eating out, no skipping meals, meal planning and cooking strategies, keeping healthy foods in the home, and decreasing junk food.  Brittney Ruiz has agreed to follow-up with our clinic in 3 weeks. She was informed of the importance of frequent follow-up visits to maximize her success with intensive lifestyle modifications for her multiple health conditions.   Objective:   Blood pressure 116/81, pulse 65, temperature 98 F (36.7 C), height '5\' 2"'$  (1.575 m), weight 197 lb (89.4 kg), SpO2 97 %. Body mass index is 36.03 kg/m.  General: Cooperative, alert, well developed, in no acute distress. HEENT: Conjunctivae and lids unremarkable. Cardiovascular: Regular rhythm.  Lungs: Normal work of breathing. Neurologic: No focal deficits.   Lab Results  Component Value Date   CREATININE 0.63 02/02/2022   BUN 20 02/02/2022   NA 142 02/02/2022   K 4.5 02/02/2022   CL 102 02/02/2022   CO2 24 02/02/2022   Lab Results  Component Value Date   ALT 33 (H) 02/02/2022   AST 27 02/02/2022   ALKPHOS 81 02/02/2022   BILITOT 0.5 02/02/2022   Lab Results  Component Value Date   HGBA1C 5.5 02/02/2022   HGBA1C 5.3 06/09/2021   HGBA1C 5.5 12/04/2020   HGBA1C  5.5 08/08/2020   HGBA1C 5.4 05/07/2020   Lab Results  Component Value Date   INSULIN 16.0 02/02/2022   INSULIN 8.2 06/09/2021   INSULIN 10.4 12/04/2020   INSULIN 10.7 08/08/2020   INSULIN 8.2 05/07/2020   Lab Results  Component Value Date   TSH 1.48 03/03/2021   Lab Results  Component Value Date   CHOL 216 (H) 02/02/2022   HDL 37 (L) 02/02/2022   LDLCALC 151 (H) 02/02/2022   LDLDIRECT 141.0  05/02/2018   TRIG 152 (H) 02/02/2022   CHOLHDL 7.4 (H) 06/09/2021   Lab Results  Component Value Date   VD25OH 48.7 06/09/2021   VD25OH 58.9 12/04/2020   VD25OH 45.2 08/08/2020   Lab Results  Component Value Date   WBC 7.5 10/07/2020   HGB 15.4 (H) 10/07/2020   HCT 45.5 10/07/2020   MCV 86.2 10/07/2020   PLT 152 10/07/2020   Lab Results  Component Value Date   IRON 77 04/21/2021   TIBC 283 04/21/2021   FERRITIN 336 (H) 04/21/2021   Attestation Statements:   Reviewed by clinician on day of visit: allergies, medications, problem list, medical history, surgical history, family history, social history, and previous encounter notes.  I, Davy Pique, am acting as Location manager for Loyal Gambler, DO.  I have reviewed the above documentation for accuracy and completeness, and I agree with the above. Dell Ponto, DO

## 2022-03-19 ENCOUNTER — Other Ambulatory Visit: Payer: Self-pay | Admitting: Family

## 2022-03-19 ENCOUNTER — Telehealth (INDEPENDENT_AMBULATORY_CARE_PROVIDER_SITE_OTHER): Payer: Medicare HMO | Admitting: Family

## 2022-03-19 DIAGNOSIS — Z20822 Contact with and (suspected) exposure to covid-19: Secondary | ICD-10-CM | POA: Diagnosis not present

## 2022-03-19 DIAGNOSIS — U071 COVID-19: Secondary | ICD-10-CM | POA: Insufficient documentation

## 2022-03-19 MED ORDER — NIRMATRELVIR/RITONAVIR (PAXLOVID)TABLET: 3.0000 | ORAL_TABLET | Freq: Two times a day (BID) | ORAL | 0 refills | Status: AC

## 2022-03-19 NOTE — Progress Notes (Unsigned)
Virtual Visit via Video Note  I connected with@  on 03/19/22 at  by a video enabled telemedicine application and verified that I am speaking with the correct person using two identifiers.  Location patient: home Location provider:work  Persons participating in the virtual visit: patient, provider  I discussed the limitations of evaluation and management by telemedicine and the availability of in person appointments. The patient expressed understanding and agreed to proceed.   HPI: Complains of cough and congestion, 1 day  Endorses nasal congestion, 'little HA'.  She has used tyleonol with some relief  She has not distanced herself from husband who has [place]. His symptoms started 5 days ago.   No sob, wheezing, fever  H/o asthma   ROS: See pertinent positives and negatives per HPI.    EXAM:  VITALS per patient if applicable: LMP  (LMP Unknown)  BP Readings from Last 3 Encounters:  03/09/22 116/81  03/04/22 130/78  02/02/22 132/86   Wt Readings from Last 3 Encounters:  03/09/22 197 lb (89.4 kg)  03/04/22 198 lb 9.6 oz (90.1 kg)  02/02/22 193 lb (87.5 kg)    GENERAL: alert, oriented, appears well and in no acute distress  HEENT: atraumatic, conjunttiva clear, no obvious abnormalities on inspection of external nose and ears  NECK: normal movements of the head and neck  LUNGS: on inspection no signs of respiratory distress, breathing rate appears normal, no obvious gross SOB, gasping or wheezing  CV: no obvious cyanosis  MS: moves all visible extremities without noticeable abnormality  PSYCH/NEURO: pleasant and cooperative, no obvious depression or anxiety, speech and thought processing grossly intact  ASSESSMENT AND PLAN:  Discussed the following assessment and plan:  Problem List Items Addressed This Visit   None   -we discussed possible serious and likely etiologies, options for evaluation and workup, limitations of telemedicine visit vs in person visit,  treatment, treatment risks and precautions. Pt prefers to treat via telemedicine empirically rather then risking or undertaking an in person visit at this moment.  .   I discussed the assessment and treatment plan with the patient. The patient was provided an opportunity to ask questions and all were answered. The patient agreed with the plan and demonstrated an understanding of the instructions.   The patient was advised to call back or seek an in-person evaluation if the symptoms worsen or if the condition fails to improve as anticipated.   Mable Paris, FNP

## 2022-03-20 NOTE — Assessment & Plan Note (Addendum)
Fortunately no acute respiratory distress, wheezing or shortness of breath.  Patient has history of asthma.  Discussed with her close contact [place] with her husband and concern although COVID test is negative today that she could develop COVID.  I recommended empiric treatment Paxlovid as starting medication soon as possible is more advantageous than waiting.  she is very much in agreement of this.  Discussed side effects and advised since she is on propranolol to monitor blood pressure while on Paxlovid.  She will let me know how she is doing.

## 2022-03-20 NOTE — Progress Notes (Signed)
I connected with@  on 03/19/22 at  by a video enabled telemedicine application and verified that I am speaking with the correct person using two identifiers.  Location patient: home Location provider:work  Persons participating in the virtual visit: patient, provider   I discussed the limitations of evaluation and management by telemedicine and the availability of in person appointments. The patient expressed understanding and agreed to proceed.     HPI: Complains of cough and congestion, 1 day   Endorses nasal congestion, 'little HA'.  She has used tyleonol with some relief   She has not distanced herself from husband who has [place]. She is wearing a mask. His symptoms started 5 days ago.    No sob, wheezing, fever   H/o asthma   No h/o CKD   ROS: See pertinent positives and negatives per HPI.       EXAM:   VITALS per patient if applicable: LMP  (LMP Unknown)     BP Readings from Last 3 Encounters:  03/09/22 116/81  03/04/22 130/78  02/02/22 132/86       Wt Readings from Last 3 Encounters:  03/09/22 197 lb (89.4 kg)  03/04/22 198 lb 9.6 oz (90.1 kg)  02/02/22 193 lb (87.5 kg)      GENERAL: alert, oriented, appears well and in no acute distress   HEENT: atraumatic, conjunttiva clear, no obvious abnormalities on inspection of external nose and ears   NECK: normal movements of the head and neck   LUNGS: on inspection no signs of respiratory distress, breathing rate appears normal, no obvious gross SOB, gasping or wheezing   CV: no obvious cyanosis   MS: moves all visible extremities without noticeable abnormality   PSYCH/NEURO: pleasant and cooperative, no obvious depression or anxiety, speech and thought processing grossly intact   ASSESSMENT AND PLAN:   Discussed the following assessment and plan: Problem List Items Addressed This Visit       Other   Close exposure to COVID-19 virus - Primary    Fortunately no acute respiratory distress, wheezing or  shortness of breath.  Patient has history of asthma.  Discussed with her close contact [place] with her husband and concern although COVID test is negative today that she could develop COVID.  I recommended empiric treatment Paxlovid as starting medication soon as possible is more advantageous than waiting.  she is very much in agreement of this.  Discussed side effects and advised since she is on propranolol to monitor blood pressure while on Paxlovid.  She will let me know how she is doing.          -we discussed possible serious and likely etiologies, options for evaluation and workup, limitations of telemedicine visit vs in person visit, treatment, treatment risks and precautions. Pt prefers to treat via telemedicine empirically rather then risking or undertaking an in person visit at this moment.  .   I discussed the assessment and treatment plan with the patient. The patient was provided an opportunity to ask questions and all were answered. The patient agreed with the plan and demonstrated an understanding of the instructions.   The patient was advised to call back or seek an in-person evaluation if the symptoms worsen or if the condition fails to improve as anticipated.     Mable Paris, FNP

## 2022-03-20 NOTE — Patient Instructions (Signed)
Please stay in quarantine per cdc guidelines.   If you test positive for COVID-19, stay home for at least 5 days and isolate from others in your home. You are likely most infectious during these first 5 days. Wear a high-quality mask if you must be around others at home and in public. Do not go places where you are unable to wear a mask.  We discussed starting Paxlovid for empiric treatment which is an unapproved drug that is authorized for use under an Emergency Use Authorization.  There are no adequate, approved, available products for the treatment of COVID-19 in adults who have mild-to-moderate COVID-19 and are at high risk for progressing to severe COVID-19, including hospitalization or death.  There are benefits and risks of taking this treatment as outlined in the "Fact Sheet for Patients and Caregivers." You may find this document here and please read in detail   [email or web address]   I have sent Paxlovid to your pharmacy. Please call pharmacy so they bring medication out to your car and you do not have to go inside.   PAXLOVID ADMINISTRATION INSTRUCTIONS:  Take with or without food. Swallow the tablets whole. Don't chew, crush, or break the medications because it might not work as well  For each dose of the medication, you should be taking 3 tablets together (2 pink oval and 1 white oval) TWICE a day for FIVE days   Finish your full five-day course of Paxlovid even if you feel better before you're done. Stopping this medication too early can make it less effective to prevent severe illness related to [place].    Paxlovid is prescribed for YOU ONLY. Don't share it with others, even if they have similar symptoms as you. This medication might not be right for everyone.  Make sure to take steps to protect yourself and others while you're taking this medication in order to get well soon and to prevent others from getting sick with COVID-19.  Paxlovid (nirmatrelvir /  ritonavir) can cause hormonal birth control medications to not work well. If you or your partner is currently taking hormonal birth control, use condoms or other birth control methods to prevent unintended pregnancies.   COMMON SIDE EFFECTS: Altered or bad taste in your mouth  Diarrhea  High blood pressure (1% of people) Muscle aches (1% of people)    If your COVID-19 symptoms get worse, get medical help right away. Call 911 if you experience symptoms such as worsening cough, trouble breathing, chest pain that doesn't go away, confusion, a hard time staying awake, and pale or blue-colored skin.This medication won't prevent all COVID-19 cases from getting worse.

## 2022-03-26 ENCOUNTER — Encounter: Payer: Medicare HMO | Admitting: Dermatology

## 2022-03-26 ENCOUNTER — Telehealth: Payer: Self-pay | Admitting: Family

## 2022-03-26 NOTE — Telephone Encounter (Signed)
Pt called stating she saw the provider for a video visit and she was given medication and pt has now finish the medication and is not feeling any better. Pt sates she is taking OTC medication. Pt would like to be called

## 2022-03-27 ENCOUNTER — Ambulatory Visit (INDEPENDENT_AMBULATORY_CARE_PROVIDER_SITE_OTHER): Payer: Medicare HMO | Admitting: Family

## 2022-03-27 ENCOUNTER — Ambulatory Visit (INDEPENDENT_AMBULATORY_CARE_PROVIDER_SITE_OTHER): Payer: Medicare HMO

## 2022-03-27 ENCOUNTER — Encounter: Payer: Self-pay | Admitting: Family

## 2022-03-27 VITALS — BP 138/78 | HR 96 | Temp 97.7°F | Ht 61.5 in | Wt 192.4 lb

## 2022-03-27 DIAGNOSIS — R059 Cough, unspecified: Secondary | ICD-10-CM | POA: Diagnosis not present

## 2022-03-27 DIAGNOSIS — U071 COVID-19: Secondary | ICD-10-CM | POA: Diagnosis not present

## 2022-03-27 DIAGNOSIS — J209 Acute bronchitis, unspecified: Secondary | ICD-10-CM | POA: Diagnosis not present

## 2022-03-27 DIAGNOSIS — R062 Wheezing: Secondary | ICD-10-CM | POA: Diagnosis not present

## 2022-03-27 MED ORDER — AMOXICILLIN-POT CLAVULANATE 875-125 MG PO TABS: 1.0000 | ORAL_TABLET | Freq: Two times a day (BID) | ORAL | 0 refills | Status: AC

## 2022-03-27 NOTE — Patient Instructions (Addendum)
Stop flonase  Start azelastine over the counter nasal spray  If symptoms continue or certainly get worse, I will start augmentin. I have sent in today.  Ensure to take probiotics while on antibiotics and also for 2 weeks after completion. This can either be by eating yogurt daily or taking a probiotic supplement over the counter such as Culturelle.It is important to re-colonize the gut with good bacteria and also to prevent any diarrheal infections associated with antibiotic use.

## 2022-03-27 NOTE — Telephone Encounter (Signed)
Patient states she is returning call from Jenate [place], [place].  I read previous messages, spoke with [facility], and transferred call to her.

## 2022-03-27 NOTE — Telephone Encounter (Signed)
LVM to call back to office  

## 2022-03-27 NOTE — Telephone Encounter (Signed)
Spoke to patient and scheduled in office appt today @ 11:30

## 2022-03-27 NOTE — Assessment & Plan Note (Addendum)
Tested positive for COVID 3 days after I had a video visit with patient.  Started Paxlovid at that time.  Fortunately she has not had fever or cough.  Continued nasal congestion, bilateral sore throat.  Reassuring exam today and discussed with patient likely lingering symptoms after viral URI.  Encouraged her to use albuterol inhaler certainly if needed.  She will continue Mucinex, OTC dimetap.  Point-of-care strep test is negative today.  We did discuss if symptoms were to worsen or persist,  concern for bacterial sinusitis.  I have sent in Augmentin and advised her to start if symptoms persist or get worse and encouraged probiotics.  She will also trial changing from Flonase to azelastine to see if this is helpful.  She will let me know how she is doing Pending CXR

## 2022-03-27 NOTE — Progress Notes (Signed)
Subjective:    Patient ID: Brittney Ruiz, female    DOB: 1965-11-16, 56 y.o.   MRN: [id number]  CC: Brittney Ruiz is a 56 y.o. female who presents today for an acute visit.    HPI: HPI Follow-up after close exposure to COVID-19 and then 7 days ago had positive home covid test. Started  Paxlovid after covid positive.   Here today for continued symptoms, particularly bilateral sore throat, nasal congestion x 10 days.  She has not had a cough. She will occasionally bring up mucous which has changed from green to clear.  She has not used albuterol . She has completed Paxlovid.  She is using Mucinex. She has been using dimetap allergy with some relief. Using allegra, Children's ibuprofen, flonase.   No fever, cp, wheezing, sob.   History of tonsillectomy  No recent antibiotics.  She tolerates Augmentin well  she is allergic to codeine, prednisone.  Tessalon in the past not been effective for her. HISTORY:  Past Medical History:  Diagnosis Date   Abnormal Pap smear of cervix    h/o LEEP   Allergy    Angio-edema    Anxiety    Asthma    Bladder leak    Cancer ([place])    melanoma 2003   Chest pain    Constipation    COPD (chronic obstructive pulmonary disease) (HCC)    Depression    Diarrhea    Food allergy    Heart valve problem    Heartburn    History of swelling of feet    Hyperlipidemia    Hypertension    IBS (irritable bowel syndrome)    Joint pain    Low back pain    Melanoma ([place])    right foot s/p lymph node removed right groin   Obesity (BMI 30-39.9)    Palpitation    Prediabetes    Shortness of breath    Urticaria    Vaginal delivery    X 2   Past Surgical History:  Procedure Laterality Date   BLADDER SUSPENSION  07/2017   COLONOSCOPY WITH PROPOFOL N/A 07/01/2017   Procedure: COLONOSCOPY WITH PROPOFOL;  Surgeon: Virgel Manifold, MD;  Location: [facility] END[facility]COPY;  Service: Endoscopy;  Laterality: N/A;   ESOPHAGOGASTRODUODENOSCOPY (EGD) WITH PROPOFOL N/A  07/01/2017   Procedure: ESOPHAGOGASTRODUODENOSCOPY (EGD) WITH PROPOFOL;  Surgeon: Virgel Manifold, MD;  Location: [facility] ENDOSCOPY;  Service: Endoscopy;  Laterality: N/A;   LEEP  1995   LEEP     PARTIAL HYSTERECTOMY  08/2017   s/p partial hysterectomy - per patient has fallopian tubes; ovaries INTACT. NO CERVIX on exam 03/06/20   SKIN CANCER EXCISION     melanoma   TONSILLECTOMY     Family History  Problem Relation Age of Onset   Hyperlipidemia Mother    Hypertension Mother    Diabetes Mother    Breast cancer Mother 42   Stroke Mother    Cancer Mother    Cancer Father        lung   Depression Father    Diabetes Father    Hypertension Father    Hyperlipidemia Father    Sleep apnea Father    Alcoholism Father    Hyperlipidemia Sister    Hypertension Sister    Migraines Sister    Hypertension Brother    Hyperlipidemia Brother    Migraines Brother    Cancer Maternal Grandmother        glioblastoma   Depression Maternal Grandfather  Heart disease Maternal Grandfather    Cancer Paternal Grandfather        unknown   AAA (abdominal aortic aneurysm) Paternal Grandfather     Allergies: Elemental sulfur, Pork allergy, Pork-derived products, Aciphex [rabeprazole sodium], Antivert [meclizine hcl], Buspar [buspirone hcl], Cat hair extract, Chlorpheniramine-pseudoeph, Chlorpheniramine-pseudoeph, Chocolate flavor, Codeine, Crestor [rosuvastatin calcium], Cymbalta [duloxetine hcl], Duloxetine, Effexor xr [venlafaxine hydrochloride], Epinephrine, Erythromycin, Escitalopram oxalate, Lamictal [lamotrigine], Levofloxacin, Lipitor [atorvastatin], Meclizine, Mometasone furoate, Mometasone furoate, Monosodium glutamate, Neurontin [gabapentin], Other, Prednisone, Red yeast rice extract, Shellfish allergy, Sudafed [pseudoephedrine hcl], Sulfa antibiotics, Sulfasalazine, Valium, Wellbutrin [bupropion hcl], Wixela inhub [fluticasone-salmeterol], Xanax xr [alprazolam], Zetia [ezetimibe], Alfalfa,  Brompheniramine-phenylephrine, Buspirone, and Tape Current Outpatient Medications on File Prior to Visit  Medication Sig Dispense Refill   albuterol (VENTOLIN HFA) 108 (90 Base) MCG/ACT inhaler USE 2 INHALATIONS ORALLY   EVERY 6 HOURS AS NEEDED. 18 g 1   Alcohol Swabs (B-D SINGLE USE SWABS REGULAR) PADS USE 1 SWAB TOPICALLY TO    CLEAN FINGER BEFORE        CHECKING GLUCOSE 300 each 1   Ascorbic Acid (VITAMIN C) 1000 MG tablet Take 1,000 mg by mouth daily.     aspirin EC 81 MG tablet Take 81 mg by mouth at bedtime.     B Complex Vitamins (BL VITAMIN B COMPLEX PO) +Folic acid qd.     Blood Glucose Monitoring Suppl (TRUE METRIX METER) w/Device KIT USED TO CHECK GLUCOSE READINGS  ONE TIME DAILY AS NEEDED. 1 kit 0   Calcium Carb-Cholecalciferol (CALCIUM 600/VITAMIN D PO) Take 1 tablet by mouth daily.     Cholecalciferol (VITAMIN D-3) 25 MCG (1000 UT) CAPS Take 1 capsule by mouth daily.     clorazepate (TRANXENE) 7.5 MG tablet Take 7.5 mg by mouth 3 (three) times daily.     fexofenadine (ALLEGRA) 60 MG tablet Take 1 tablet (60 mg total) by mouth daily. (Patient taking differently: Take 180 mg by mouth daily.) 90 tablet 1   fluticasone (FLONASE) 50 MCG/ACT nasal spray USE 1 SPRAY IN EACH NOSTRILAT BEDTIME 16 g 2   glucose blood (TRUE METRIX BLOOD GLUCOSE TEST) test strip TEST BLOOD SUGAR ONE TIME DAILY 100 strip 0   Multiple Vitamin (MULTIVITAMIN) tablet Take 1 tablet by mouth daily.     Omega-3 Fatty Acids (FISH OIL) 1000 MG CAPS Take 1 capsule by mouth daily.     omeprazole (PRILOSEC) 20 MG capsule TAKE 1 CAPSULE DAILY 90 capsule 1   polyethylene glycol powder (GLYCOLAX/MIRALAX) 17 GM/SCOOP powder Take 1 Container by mouth at bedtime.     propranolol (INDERAL) 20 MG tablet Take 1 tablet (20 mg total) by mouth 3 (three) times daily. 90 tablet 3   psyllium (METAMUCIL) 58.6 % powder Take 1 packet by mouth daily.     sertraline (ZOLOFT) 100 MG tablet TAKE 1+1/2 TABLETS BY MOUTH DAILY AS DIRECTED (Patient  taking differently: Take 112.5 mg by mouth See admin instructions. 112.5 in the morning and 25 mg in the evening) 45 tablet 3   sertraline (ZOLOFT) 25 MG tablet Take 25 mg by mouth every evening.      TRUEplus Lancets 33G MISC TEST BLOOD SUGAR ONE TIME DAILY 100 each 0   No current facility-administered medications on file prior to visit.    Social History   Tobacco Use   Smoking status: Former    Packs/day: 1.00    Years: 25.00    Total pack years: 25.00    Types: Cigarettes    Quit  date: 2008    Years since quitting: 15.8   Smokeless tobacco: Never  Vaping Use   Vaping Use: Never used  Substance Use Topics   Alcohol use: No   Drug use: No    Review of Systems  Constitutional:  Negative for chills and fever.  HENT:  Positive for congestion and postnasal drip. Negative for sinus pressure.   Respiratory:  Positive for wheezing (occasional). Negative for cough and shortness of breath.   Cardiovascular:  Negative for chest pain and palpitations.  Gastrointestinal:  Negative for nausea and vomiting.      Objective:    BP 138/78 (BP Location: Left Arm, Patient Position: Sitting, Cuff Size: Normal)   Pulse 96   Temp 97.7 F (36.5 C) (Oral)   Ht 5' 1.5" (1.562 m)   Wt 192 lb 6.4 oz (87.3 kg)   LMP  (LMP Unknown)   SpO2 95%   BMI 35.76 kg/m    Physical Exam Vitals reviewed.  Constitutional:      Appearance: She is well-developed.  HENT:     Head: Normocephalic and atraumatic.     Right Ear: Hearing, tympanic membrane, ear canal and external ear normal. No decreased hearing noted. No drainage, swelling or tenderness. No middle ear effusion. No foreign body. Tympanic membrane is not erythematous or bulging.     Left Ear: Hearing, tympanic membrane, ear canal and external ear normal. No decreased hearing noted. No drainage, swelling or tenderness.  No middle ear effusion. No foreign body. Tympanic membrane is not erythematous or bulging.     Nose: Nose normal. No  rhinorrhea.     Right Sinus: No maxillary sinus tenderness or frontal sinus tenderness.     Left Sinus: No maxillary sinus tenderness or frontal sinus tenderness.     Mouth/Throat:     Pharynx: Uvula midline. No oropharyngeal exudate or posterior oropharyngeal erythema.     Comments: Tonsils absent  Eyes:     Conjunctiva/sclera: Conjunctivae normal.  Cardiovascular:     Rate and Rhythm: Regular rhythm.     Pulses: Normal pulses.     Heart sounds: Normal heart sounds.  Pulmonary:     Effort: Pulmonary effort is normal.     Breath sounds: Normal breath sounds. No wheezing, rhonchi or rales.  Lymphadenopathy:     Head:     Right side of head: No submental, submandibular, tonsillar, preauricular, posterior auricular or occipital adenopathy.     Left side of head: No submental, submandibular, tonsillar, preauricular, posterior auricular or occipital adenopathy.     Cervical: No cervical adenopathy.  Skin:    General: Skin is warm and dry.  Neurological:     Mental Status: She is alert.  Psychiatric:        Speech: Speech normal.        Behavior: Behavior normal.        Thought Content: Thought content normal.        Assessment & Plan:   Problem List Items Addressed This Visit       Other   COVID-19 - Primary    Tested positive for COVID 3 days after I had a video visit with patient.  Started Paxlovid at that time.  Fortunately she has not had fever or cough.  Continued nasal congestion, bilateral sore throat.  Reassuring exam today and discussed with patient likely lingering symptoms after viral URI.  Encouraged her to use albuterol inhaler certainly if needed.  She will continue Mucinex, OTC dimetap.  Point-of-care  strep test is negative today.  We did discuss if symptoms were to worsen or persist,  concern for bacterial sinusitis.  I have sent in Augmentin and advised her to start if symptoms persist or get worse and encouraged probiotics.  She will also trial changing from  Flonase to azelastine to see if this is helpful.  She will let me know how she is doing Pending CXR      Relevant Medications   amoxicillin-clavulanate (AUGMENTIN) 875-125 MG tablet      I am having Jasnoor F. Onder start on amoxicillin-clavulanate. I am also having her maintain her clorazepate, multivitamin, sertraline, propranolol, psyllium, B Complex Vitamins (BL VITAMIN B COMPLEX PO), aspirin EC, Fish Oil, polyethylene glycol powder, sertraline, Vitamin D-3, Calcium Carb-Cholecalciferol (CALCIUM 600/VITAMIN D PO), True Metrix Meter, fexofenadine, True Metrix Blood Glucose Test, TRUEplus Lancets 33G, Ventolin HFA, B-D SINGLE USE SWABS REGULAR, vitamin C, omeprazole, and fluticasone.   Meds ordered this encounter  Medications   amoxicillin-clavulanate (AUGMENTIN) 875-125 MG tablet    Sig: Take 1 tablet by mouth 2 (two) times daily for 7 days.    Dispense:  14 tablet    Refill:  0    Order Specific Question:   Supervising Provider    Answer:   Crecencio Mc [[id number]]    Return precautions given.   Risks, benefits, and alternatives of the medications and treatment plan prescribed today were discussed, and patient expressed understanding.   Education regarding symptom management and diagnosis given to patient on AVS.  Continue to follow with Burnard Hawthorne, FNP for routine health maintenance.   Aggie Hacker and I agreed with plan.   Mable Paris, FNP

## 2022-03-30 NOTE — Progress Notes (Signed)
TeleHealth Visit:  This visit was completed with telemedicine (audio/video) technology. Brittney Ruiz has verbally consented to this TeleHealth visit. The patient is located at home, the provider is located at home. The participants in this visit include the listed provider and patient. The visit was conducted today via MyChart video.  OBESITY Brittney Ruiz is here to discuss her progress with her obesity treatment plan along with follow-up of her obesity related diagnoses.   Today's visit was # 34 Starting weight: 217 lbs Starting date: 06/01/2019 Weight at last in office visit: 197 lbs on 03/09/22 Total weight loss: 20 lbs at last in office visit on 03/09/22. Today's reported weight: *** lbs No weight reported.  Nutrition Plan:  keeping a food journal and adhering to recommended goals of 1400-1500 calories and 100 protein daily.    Current exercise: {exercise types:16438} none  Interim History: ***  Assessment/Plan:  1. ***  2. ***  3. ***  Obesity: Current BMI *** Brittney Ruiz {CHL AMB IS/IS NOT:210130109} currently in the action stage of change. As such, her goal is to {MWMwtloss#1:210800005}.  She has agreed to {MWMwtlossportion/plan2:23431}.   Exercise goals: {MWM EXERCISE RECS:23473}  Behavioral modification strategies: {MWMwtlossdietstrategies3:23432}.  Brittney Ruiz has agreed to follow-up with our clinic in {NUMBER 1-10:22536} weeks.   No orders of the defined types were placed in this encounter.   There are no discontinued medications.   No orders of the defined types were placed in this encounter.     Objective:   VITALS: Per patient if applicable, see vitals. GENERAL: Alert and in no acute distress. CARDIOPULMONARY: No increased WOB. Speaking in clear sentences.  PSYCH: Pleasant and cooperative. Speech normal rate and rhythm. Affect is appropriate. Insight and judgement are appropriate. Attention is focused, linear, and appropriate.  NEURO: Oriented as arrived to appointment  on time with no prompting.   Lab Results  Component Value Date   CREATININE 0.63 02/02/2022   BUN 20 02/02/2022   NA 142 02/02/2022   K 4.5 02/02/2022   CL 102 02/02/2022   CO2 24 02/02/2022   Lab Results  Component Value Date   ALT 33 (H) 02/02/2022   AST 27 02/02/2022   ALKPHOS 81 02/02/2022   BILITOT 0.5 02/02/2022   Lab Results  Component Value Date   HGBA1C 5.5 02/02/2022   HGBA1C 5.3 06/09/2021   HGBA1C 5.5 12/04/2020   HGBA1C 5.5 08/08/2020   HGBA1C 5.4 05/07/2020   Lab Results  Component Value Date   INSULIN 16.0 02/02/2022   INSULIN 8.2 06/09/2021   INSULIN 10.4 12/04/2020   INSULIN 10.7 08/08/2020   INSULIN 8.2 05/07/2020   Lab Results  Component Value Date   TSH 1.48 03/03/2021   Lab Results  Component Value Date   CHOL 216 (H) 02/02/2022   HDL 37 (L) 02/02/2022   LDLCALC 151 (H) 02/02/2022   LDLDIRECT 141.0 05/02/2018   TRIG 152 (H) 02/02/2022   CHOLHDL 7.4 (H) 06/09/2021   Lab Results  Component Value Date   WBC 7.5 10/07/2020   HGB 15.4 (H) 10/07/2020   HCT 45.5 10/07/2020   MCV 86.2 10/07/2020   PLT 152 10/07/2020   Lab Results  Component Value Date   IRON 77 04/21/2021   TIBC 283 04/21/2021   FERRITIN 336 (H) 04/21/2021   Lab Results  Component Value Date   VD25OH 48.7 06/09/2021   VD25OH 58.9 12/04/2020   VD25OH 45.2 08/08/2020    Attestation Statements:   Reviewed by clinician on day of visit: allergies, medications, problem  list, medical history, surgical history, family history, social history, and previous encounter notes.  ***(delete if time-based billing not used) Time spent on visit including the items listed below was *** minutes.  -preparing to see the patient (e.g., review of tests, history, previous notes) -obtaining and/or reviewing separately obtained history -counseling and educating the patient/family/caregiver -documenting clinical information in the electronic or other health record -ordering medications,  tests, or procedures -independently interpreting results and communicating results to the patient/ family/caregiver -referring and communicating with other health care professionals  -care coordination

## 2022-03-31 ENCOUNTER — Telehealth (INDEPENDENT_AMBULATORY_CARE_PROVIDER_SITE_OTHER): Payer: Medicare HMO | Admitting: Family Medicine

## 2022-03-31 ENCOUNTER — Encounter (INDEPENDENT_AMBULATORY_CARE_PROVIDER_SITE_OTHER): Payer: Self-pay | Admitting: Family Medicine

## 2022-03-31 DIAGNOSIS — E785 Hyperlipidemia, unspecified: Secondary | ICD-10-CM | POA: Diagnosis not present

## 2022-03-31 DIAGNOSIS — E669 Obesity, unspecified: Secondary | ICD-10-CM | POA: Diagnosis not present

## 2022-03-31 DIAGNOSIS — Z6839 Body mass index (BMI) 39.0-39.9, adult: Secondary | ICD-10-CM

## 2022-03-31 DIAGNOSIS — E88819 Insulin resistance, unspecified: Secondary | ICD-10-CM

## 2022-03-31 DIAGNOSIS — Z6836 Body mass index (BMI) 36.0-36.9, adult: Secondary | ICD-10-CM

## 2022-03-31 DIAGNOSIS — E7849 Other hyperlipidemia: Secondary | ICD-10-CM

## 2022-03-31 NOTE — Telephone Encounter (Signed)
Pt called to let provider know that she tested positive for covid on the 11th day. Pt stated she is still taking the tussin, ibuprofen and dimetapp

## 2022-04-01 ENCOUNTER — Encounter (INDEPENDENT_AMBULATORY_CARE_PROVIDER_SITE_OTHER): Payer: Self-pay | Admitting: Family Medicine

## 2022-04-03 NOTE — Telephone Encounter (Signed)
noted 

## 2022-04-03 NOTE — Telephone Encounter (Signed)
Call pt  Per cdc  Some tests, especially PCR tests, may continue to show a positive result for up to 90 days. Reinfections can occur within 90 days.  If she has new or worsening symptoms please advise appt with me on in person eval at [facility] [place] from [name] .  I want to ensure she is safe as we head into the weekend

## 2022-04-03 NOTE — Telephone Encounter (Signed)
Spoke to t and she stated that she had developed new symptoms such as a headache cotton mouth and coughing.I offered pt an appt to come in or go to UC but she declined . Pt stated that she just wanted to inform you but didn't need to come in. Pt stated that she tested negative for Covid on Sun but tested again on Mon and was positive

## 2022-04-14 ENCOUNTER — Ambulatory Visit: Payer: Medicare HMO | Admitting: Dermatology

## 2022-04-14 ENCOUNTER — Encounter: Payer: Self-pay | Admitting: Dermatology

## 2022-04-14 DIAGNOSIS — L304 Erythema intertrigo: Secondary | ICD-10-CM

## 2022-04-14 DIAGNOSIS — Z1283 Encounter for screening for malignant neoplasm of skin: Secondary | ICD-10-CM

## 2022-04-14 DIAGNOSIS — Z85820 Personal history of malignant melanoma of skin: Secondary | ICD-10-CM | POA: Diagnosis not present

## 2022-04-14 DIAGNOSIS — L905 Scar conditions and fibrosis of skin: Secondary | ICD-10-CM | POA: Diagnosis not present

## 2022-04-14 DIAGNOSIS — L821 Other seborrheic keratosis: Secondary | ICD-10-CM | POA: Diagnosis not present

## 2022-04-14 DIAGNOSIS — L578 Other skin changes due to chronic exposure to nonionizing radiation: Secondary | ICD-10-CM | POA: Diagnosis not present

## 2022-04-14 DIAGNOSIS — D2372 Other benign neoplasm of skin of left lower limb, including hip: Secondary | ICD-10-CM | POA: Diagnosis not present

## 2022-04-14 DIAGNOSIS — L814 Other melanin hyperpigmentation: Secondary | ICD-10-CM

## 2022-04-14 DIAGNOSIS — L720 Epidermal cyst: Secondary | ICD-10-CM

## 2022-04-14 DIAGNOSIS — D229 Melanocytic nevi, unspecified: Secondary | ICD-10-CM

## 2022-04-14 MED ORDER — KETOCONAZOLE 2 % EX CREA: 1.0000 | TOPICAL_CREAM | Freq: Two times a day (BID) | CUTANEOUS | 2 refills | Status: AC

## 2022-04-14 MED ORDER — HYDROCORTISONE 2.5 % EX CREA: TOPICAL_CREAM | Freq: Two times a day (BID) | CUTANEOUS | 1 refills | Status: DC | PRN

## 2022-04-14 NOTE — Progress Notes (Unsigned)
Follow-Up Visit   Subjective  Brittney Ruiz is a 56 y.o. female who presents for the following: Annual Exam (Hx of melanoma, AK's. Patient does have a spot at left ear she would like checked. Also with a cyst at left inguinal fold that she would like to talk about treating. ).  The patient presents for Total-Body Skin Exam (TBSE) for skin cancer screening and mole check.  The patient has spots, moles and lesions to be evaluated, some may be new or changing and the patient has concerns that these could be cancer.  Family history of skin cancer - what type(s): melanoma - who affected: mother, maternal uncle, maternal grandmother, father  The following portions of the chart were reviewed this encounter and updated as appropriate:   Tobacco  Allergies  Meds  Problems  Med Hx  Surg Hx  Fam Hx      Review of Systems:  No other skin or systemic complaints except as noted in HPI or Assessment and Plan.  Objective  Well appearing patient in no apparent distress; mood and affect are within normal limits.  A full examination was performed including scalp, head, eyes, ears, nose, lips, neck, chest, axillae, abdomen, back, buttocks, bilateral upper extremities, bilateral lower extremities, hands, feet, fingers, toes, fingernails, and toenails. All findings within normal limits unless otherwise noted below.  inframammary, inguinal folds Erythematous patches  left inguinal fold Subcutaneous papule approximately 0.8 cm  left frontal scalp Dyspigmented smooth macule or patch.     Assessment & Plan  Erythema intertrigo inframammary, inguinal folds  Chronic and persistent condition with duration or expected duration over one year. Condition is symptomatic/ bothersome to patient. Not currently at goal.  Intertrigo is a chronic recurrent rash that occurs in skin fold areas that may be associated with friction; heat; moisture; yeast; fungus; and bacteria.  It is exacerbated by increased  movement / activity; sweating; and higher atmospheric temperature.  Start ketoconazole 2% cream twice daily as needed Start HC 2.5% cream twice daily for up to 2 weeks at a time as needed.   ketoconazole (NIZORAL) 2 % cream - inframammary, inguinal folds Apply 1 Application topically 2 (two) times daily.  hydrocortisone 2.5 % cream - inframammary, inguinal folds Apply topically 2 (two) times daily as needed (Rash). Apply twice daily for up to 2 weeks at a time as needed for rash.  Epidermal inclusion cyst left inguinal fold  Benign-appearing. Exam most consistent with an epidermal inclusion cyst. Discussed that a cyst is a benign growth that can grow over time and sometimes get irritated or inflamed. Recommend observation if it is not bothersome. Discussed option of surgical excision to remove it if it is growing, symptomatic, or other changes noted. Please call for new or changing lesions so they can be evaluated.    Scar left frontal scalp  S/p punch biopsy 04/2018, results c/w lipoma  Some tenderness at site Discussed option of intralesional triamcinolone, patient defers   Lentigines - Scattered tan macules - Due to sun exposure - Benign-appearing, observe - Recommend daily broad spectrum sunscreen SPF 30+ to sun-exposed areas, reapply every 2 hours as needed. - Call for any changes  Seborrheic Keratoses - Stuck-on, waxy, tan-brown papules and/or plaques  - Benign-appearing - Discussed benign etiology and prognosis. - Observe - Call for any changes  Melanocytic Nevi - Tan-brown and/or pink-flesh-colored symmetric macules and papules - Benign appearing on exam today - Observation - Call clinic for new or changing moles - Recommend  daily use of broad spectrum spf 30+ sunscreen to sun-exposed areas.   Hemangiomas - Red papules - Discussed benign nature - Observe - Call for any changes  Actinic Damage - Chronic condition, secondary to cumulative UV/sun  exposure - diffuse scaly erythematous macules with underlying dyspigmentation - Recommend daily broad spectrum sunscreen SPF 30+ to sun-exposed areas, reapply every 2 hours as needed.  - Staying in the shade or wearing long sleeves, sun glasses (UVA+UVB protection) and wide brim hats (4-inch brim around the entire circumference of the hat) are also recommended for sun protection.  - Call for new or changing lesions.  Skin cancer screening performed today.  Dermatofibroma - Firm pink/brown papulenodule with dimple sign at left anterior thigh - Benign appearing - Call for any changes  History of Melanoma - No evidence of recurrence today - No lymphadenopathy - Recommend regular full body skin exams - Recommend daily broad spectrum sunscreen SPF 30+ to sun-exposed areas, reapply every 2 hours as needed.  - Call if any new or changing lesions are noted between office visits  - Will refer for genetic counseling  Return in about 1 year (around 04/15/2023) for TBSE, Hx MM, Hx AK.  Graciella Belton, RMA, am acting as scribe for Forest Gleason, MD .  Documentation: I have reviewed the above documentation for accuracy and completeness, and I agree with the above.  Forest Gleason, MD

## 2022-04-14 NOTE — Patient Instructions (Addendum)
Intertrigo is a chronic recurrent rash that occurs in skin fold areas that may be associated with friction; heat; moisture; yeast; fungus; and bacteria.  It is exacerbated by increased movement / activity; sweating; and higher atmospheric temperature.  Start ketoconazole 2% cream twice daily as needed Start HC 2.5% cream twice daily for up to 2 weeks at a time as needed.   Melanoma ABCDEs  Melanoma is the most dangerous type of skin cancer, and is the leading cause of death from skin disease.  You are more likely to develop melanoma if you: Have light-colored skin, light-colored eyes, or red or blond hair Spend a lot of time in the sun Tan regularly, either outdoors or in a tanning bed Have had blistering sunburns, especially during childhood Have a close family member who has had a melanoma Have atypical moles or large birthmarks  Early detection of melanoma is key since treatment is typically straightforward and cure rates are extremely high if we catch it early.   The first sign of melanoma is often a change in a mole or a new dark spot.  The ABCDE system is a way of remembering the signs of melanoma.  A for asymmetry:  The two halves do not match. B for border:  The edges of the growth are irregular. C for color:  A mixture of colors are present instead of an even brown color. D for diameter:  Melanomas are usually (but not always) greater than 110m - the size of a pencil eraser. E for evolution:  The spot keeps changing in size, shape, and color.  Please check your skin once per month between visits. You can use a small mirror in front and a large mirror behind you to keep an eye on the back side or your body.   If you see any new or changing lesions before your next follow-up, please call to schedule a visit.  Please continue daily skin protection including broad spectrum sunscreen SPF 30+ to sun-exposed areas, reapplying every 2 hours as needed when you're outdoors.    Due to  recent changes in healthcare laws, you may see results of your pathology and/or laboratory studies on MyChart before the doctors have had a chance to review them. We understand that in some cases there may be results that are confusing or concerning to you. Please understand that not all results are received at the same time and often the doctors may need to interpret multiple results in order to provide you with the best plan of care or course of treatment. Therefore, we ask that you please give u[place]2 business days to thoroughly review all your results before contacting the office for clarification. Should we see a critical lab result, you will be contacted sooner.   If You Need Anything After Your Visit  If you have any questions or concerns for your doctor, please call our main line at [phone] press option 4 to reach your doctor's medical assistant. If no one answers, please leave a voicemail as directed and we will return your call as soon as possible. Messages left after 4 pm will be answered the following business day.   You may also send [place] message via [facility] We typically respond to MyChart messages within 1-2 business days.  For prescription refills, please ask your pharmacy to contact our office. Our fax number is [phone]  If you have an urgent issue when the clinic is closed that cannot wait until the next business  day, you can page your doctor at the number below.    Please note that while we do our best to be available for urgent issues outside of office hours, we are not available 24/7.   If you have an urgent issue and are unable to reach [name], you may choose to seek medical care at your doctor's office, retail clinic, urgent care center, or emergency room.  If you have a medical emergency, please immediately call 911 or go to the emergency department.  Pager Numbers  - Dr. Nehemiah Massed: [phone]  - Dr. Laurence Ferrari: [phone]  - Dr. Nicole Kindred: [phone]  In the  event of inclement weather, please call our main line at [phone] for an update on the status of any delays or closures.  Dermatology Medication Tips: Please keep the boxes that topical medications come in in order to help keep track of the instructions about where and how to use these. Pharmacies typically print the medication instructions only on the boxes and not directly on the medication tubes.   If your medication is too expensive, please contact our office at [phone] option 4 or send [name] a message through [place].   We are unable to tell what your co-pay for medications will be in advance as this is different depending on your insurance coverage. However, we may be able to find a substitute medication at lower cost or fill out paperwork to get insurance to cover a needed medication.   If a prior authorization is required to get your medication covered by your insurance company, please allow [name] 1-2 business days to complete this process.  Drug prices often vary depending on where the prescription is filled and some pharmacies may offer cheaper prices.  The website www.goodrx.com contains coupons for medications through different pharmacies. The prices here do not account for what the cost may be with help from insurance (it may be cheaper with your insurance), but the website can give you the price if you did not use any insurance.  - You can print the associated coupon and take it with your prescription to the pharmacy.  - You may also stop by our office during regular business hours and pick up a GoodRx coupon card.  - If you need your prescription sent electronically to a different pharmacy, notify our office through [facility] or by phone at [phone] option 4.     Si Usted Necesita Algo Despus de Su Visita  Tambin puede enviarnos un mensaje a travs de Pharmacist, community. Por lo general respondemos a los mensajes de MyChart en el transcurso de 1 a 2 das hbiles.  Para  renovar recetas, por favor pida a su farmacia que se ponga en contacto con nuestra oficina. Harland Dingwall de fax es [place] [phone].  Si tiene un asunto urgente cuando la clnica est cerrada y que no puede esperar hasta el siguiente da hbil, puede llamar/localizar a su doctor(a) al nmero que aparece a continuacin.   Por favor, tenga en cuenta que aunque hacemos todo lo posible para estar disponibles para asuntos urgentes fuera del horario de [place], no estamos disponibles las 24 horas del da, los 7 das de la [place].   Si tiene un problema urgente y no puede comunicarse con nosotros, puede optar por buscar atencin mdica  en el consultorio de su doctor(a), en una clnica privada, en un centro de atencin urgente o en una sala de emergencias.  Si tiene una emergencia mdica, por favor llame inmediatamente al 911 o vaya  a la sala de Multimedia programmer.  Nmeros de bper  - Dr. Nehemiah Massed: [phone]  - Dra. Moye: [phone]  - Dra. Nicole Kindred: [phone]  En caso de inclemencias del [place], por favor llame a Johnsie Kindred principal al [phone] para una actualizacin sobre el [place] de cualquier retraso o cierre.  Consejos para la medicacin en dermatologa: Por favor, guarde las cajas en las que vienen los medicamentos de uso tpico para ayudarle a seguir las instrucciones sobre dnde y cmo usarlos. Las farmacias generalmente imprimen las instrucciones del medicamento slo en las cajas y no directamente en los tubos del [place].   Si su medicamento es muy caro, por favor, pngase en contacto con Zigmund Daniel llamando al [phone] y presione la opcin 4 o envenos un mensaje a travs de Pharmacist, community.   No podemos decirle cul ser su copago por los medicamentos por adelantado ya que esto es diferente dependiendo de la cobertura de su seguro. Sin embargo, es posible que podamos encontrar un medicamento sustituto a Electrical engineer un formulario para que el seguro cubra el  medicamento que se considera necesario.   Si se requiere una autorizacin previa para que su compaa de seguros [place] su medicamento, por favor permtanos de 1 a 2 das hbiles para completar este proceso.  Los precios de los medicamentos varan con frecuencia dependiendo del Environmental consultant de dnde se surte la receta y alguna farmacias pueden ofrecer precios ms baratos.  El sitio web www.goodrx.com tiene cupones para medicamentos de Airline pilot. Los precios aqu no tienen en cuenta lo que podra costar con la ayuda del seguro (puede ser ms barato con su seguro), pero el sitio web puede darle el precio si no utiliz Research scientist (physical sciences).  - Puede imprimir el cupn correspondiente y llevarlo con su receta a la farmacia.  - Tambin puede pasar por nuestra oficina durante el horario de atencin regular y Charity fundraiser una tarjeta de cupones de GoodRx.  - Si necesita que su receta se enve electrnicamente a una farmacia diferente, informe a nuestra oficina a travs de MyChart de [place] o por telfono llamando al [phone] y presione la opcin 4.

## 2022-04-15 ENCOUNTER — Encounter: Payer: Self-pay | Admitting: Dermatology

## 2022-04-15 ENCOUNTER — Encounter (INDEPENDENT_AMBULATORY_CARE_PROVIDER_SITE_OTHER): Payer: Self-pay

## 2022-04-20 ENCOUNTER — Ambulatory Visit (INDEPENDENT_AMBULATORY_CARE_PROVIDER_SITE_OTHER): Payer: Medicare HMO | Admitting: Family Medicine

## 2022-04-22 ENCOUNTER — Telehealth: Payer: Self-pay

## 2022-04-22 DIAGNOSIS — I10 Essential (primary) hypertension: Secondary | ICD-10-CM

## 2022-04-22 NOTE — Telephone Encounter (Signed)
Patient states Brittney Paris, FNP, originally referred her to a specialist to have her eyes examined and that eventually led her to a neurologist at [facility].  Patient states her neurologist at [facility] has referred her for a sleep study and she states it will be at least February or March, 2024, before she can have it done.  Patient states Dr. Tommi Rumps referred a home sleep study with Brittney Ruiz, BS, RRT, with [facility] in [place], for patient's husband Brittney Ruiz, Brittney Ruiz.).    Patient states Brittney Ruiz seemed very knowledgable and he can provide the sleep study for her next week.  Patient states she would like to know if Brittney Paris, FNP, will refer her to Brittney Ruiz, BS, RRT, with [facility] in [place], for her sleep study.

## 2022-04-27 NOTE — Telephone Encounter (Signed)
Call patient referral for home sleep study has been placed to [facility].    Please ask her to follow-up with Brittney Ruiz

## 2022-04-28 NOTE — Telephone Encounter (Signed)
Spoke to pt and informed her of Referral being placed. Pt verbalized understanding

## 2022-04-30 ENCOUNTER — Telehealth: Payer: Self-pay | Admitting: Family

## 2022-04-30 NOTE — Telephone Encounter (Signed)
Lft pt vm to call ofc to get the number for [place] [facility] at home. thanks

## 2022-05-03 ENCOUNTER — Other Ambulatory Visit: Payer: Self-pay | Admitting: Family

## 2022-05-03 DIAGNOSIS — D329 Benign neoplasm of meninges, unspecified: Secondary | ICD-10-CM

## 2022-05-06 DIAGNOSIS — G4733 Obstructive sleep apnea (adult) (pediatric): Secondary | ICD-10-CM | POA: Diagnosis not present

## 2022-05-06 DIAGNOSIS — R0602 Shortness of breath: Secondary | ICD-10-CM | POA: Diagnosis not present

## 2022-05-07 DIAGNOSIS — R0602 Shortness of breath: Secondary | ICD-10-CM | POA: Diagnosis not present

## 2022-05-07 DIAGNOSIS — G4733 Obstructive sleep apnea (adult) (pediatric): Secondary | ICD-10-CM | POA: Diagnosis not present

## 2022-05-08 ENCOUNTER — Ambulatory Visit
Admission: RE | Admit: 2022-05-08 | Discharge: 2022-05-08 | Disposition: A | Discharge status: Home / Self Care | Payer: Medicare HMO | Source: Ambulatory Visit | Attending: Family | Admitting: Family

## 2022-05-08 DIAGNOSIS — Z0000 Encounter for general adult medical examination without abnormal findings: Secondary | ICD-10-CM | POA: Diagnosis not present

## 2022-05-08 DIAGNOSIS — Z1231 Encounter for screening mammogram for malignant neoplasm of breast: Secondary | ICD-10-CM | POA: Diagnosis not present

## 2022-05-19 ENCOUNTER — Encounter: Payer: Self-pay | Admitting: Family

## 2022-05-19 DIAGNOSIS — G4733 Obstructive sleep apnea (adult) (pediatric): Secondary | ICD-10-CM | POA: Insufficient documentation

## 2022-05-19 NOTE — Telephone Encounter (Signed)
Pt would like to be called in regards to a cpap machine

## 2022-05-20 ENCOUNTER — Ambulatory Visit (INDEPENDENT_AMBULATORY_CARE_PROVIDER_SITE_OTHER): Payer: Medicare HMO | Admitting: Dermatology

## 2022-05-20 DIAGNOSIS — L720 Epidermal cyst: Secondary | ICD-10-CM | POA: Diagnosis not present

## 2022-05-20 DIAGNOSIS — D492 Neoplasm of unspecified behavior of bone, soft tissue, and skin: Secondary | ICD-10-CM

## 2022-05-20 MED ORDER — MUPIROCIN 2 % EX OINT: 1.0000 | TOPICAL_OINTMENT | Freq: Every day | CUTANEOUS | 0 refills | Status: DC

## 2022-05-20 NOTE — Telephone Encounter (Signed)
Spoke to pt about CPAP but she would like to know what the sleep study found. Pt would like you to call her.

## 2022-05-20 NOTE — Patient Instructions (Signed)
Wound Care Instructions for After Surgery  On the day following your surgery, you should begin doing daily dressing changes until your sutures are removed: Remove the bandage. Cleanse the wound gently with soap and water.  Make sure you then dry the skin surrounding the wound completely or the tape will not stick to the skin. Do not use cotton balls on the wound. After the wound is clean and dry, apply the ointment (either prescription antibiotic prescribed by your doctor or plain Vaseline if nothing was prescribed) gently with a Q-tip. If you are using a bandaid to cover: Apply a bandaid large enough to cover the entire wound. If you do not have a bandaid large enough to cover the wound OR if you are sensitive to bandaid adhesive: Cut a non-stick pad (such as Telfa) to fit the size of the wound.  Cover the wound with the non-stick pad. If the wound is draining, you may want to add a small amount of gauze on top of the non-stick pad for a little added compression to the area. Use tape to seal the area completely.  For the next 1-2 weeks: Be sure to keep the wound moist with ointment 24/7 to ensure best healing. If you are unable to cover the wound with a bandage to hold the ointment in place, you may need to reapply the ointment several times a day. Do not bend over or lift heavy items to reduce the chance of elevated blood pressure to the wound. Do not participate in particularly strenuous activities.  Below is a list of dressing supplies you might need.  Cotton-tipped applicators - Q-tips Gauze pads (2x2 and/or 4x4) - All-Purpose Sponges New and clean tube of petroleum jelly (Vaseline) OR prescription antibiotic ointment if prescribed Either a bandaid large enough to cover the entire wound OR non-stick dressing material (Telfa) and Tape (Paper or Hypafix)  FOR ADULT SURGERY PATIENTS: If you need something for pain relief, you may take 1 extra strength Tylenol (acetaminophen) and 2  ibuprofen (200 mg) together every 4 hours as needed. (Do not take these medications if you are allergic to them or if you know you cannot take them for any other reason). Typically you may only need pain medication for 1-3 days.   Comments on the Post-Operative Period Slight swelling and redness often appear around the wound. This is normal and will disappear within several days following the surgery. The healing wound will drain a brownish-red-yellow discharge during healing. This is a normal phase of wound healing. As the wound begins to heal, the drainage may increase in amount. Again, this drainage is normal. Notify us if the drainage becomes persistently bloody, excessively swollen, or intensely painful or develops a foul odor or red streaks.  The healing wound will also typically be itchy. This is normal. If you have severe or persistent pain, Notify us if the discomfort is severe or persistent. Avoid alcoholic beverages when taking pain medicine.  In Case of Wound Hemorrhage A wound hemorrhage is when the bandage suddenly becomes soaked with bright red blood and flows profusely. If this happens, sit down or lie down with your head elevated. If the wound has a dressing on it, do not remove the dressing. Apply pressure to the existing gauze. If the wound is not covered, use a gauze pad to apply pressure and continue applying the pressure for 20 minutes without peeking. DO NOT COVER THE WOUND WITH A LARGE TOWEL OR WASH CLOTH. Release your hand from the   wound site but do not remove the dressing. If the bleeding has stopped, gently clean around the wound. Leave the dressing in place for 24 hours if possible. This wait time allows the blood vessels to close off so that you do not spark a new round of bleeding by disrupting the newly clotted blood vessels with an immediate dressing change. If the bleeding does not subside, continue to hold pressure for 40 minutes. If bleeding continues, page your  physician, contact an After Hours clinic or go to the Emergency Room.  Due to recent changes in healthcare laws, you may see results of your pathology and/or laboratory studies on MyChart before the doctors have had a chance to review them. We understand that in some cases there may be results that are confusing or concerning to you. Please understand that not all results are received at the same time and often the doctors may need to interpret multiple results in order to provide you with the best plan of care or course of treatment. Therefore, we ask that you please give us 2 business days to thoroughly review all your results before contacting the office for clarification. Should we see a critical lab result, you will be contacted sooner.   If You Need Anything After Your Visit  If you have any questions or concerns for your doctor, please call our main line at [phone] and press option 4 to reach your doctor's medical assistant. If no one answers, please leave a voicemail as directed and we will return your call as soon as possible. Messages left after 4 pm will be answered the following business day.   You may also send us a message via MyChart. We typically respond to MyChart messages within 1-2 business days.  For prescription refills, please ask your pharmacy to contact our office. Our fax number is [phone].  If you have an urgent issue when the clinic is closed that cannot wait until the next business day, you can page your doctor at the number below.    Please note that while we do our best to be available for urgent issues outside of office hours, we are not available 24/7.   If you have an urgent issue and are unable to reach us, you may choose to seek medical care at your doctor's office, retail clinic, urgent care center, or emergency room.  If you have a medical emergency, please immediately call 911 or go to the emergency department.  Pager Numbers  - Dr. Kowalski:  [phone]  - Dr. Moye: [phone]  - Dr. Stewart: [phone]  In the event of inclement weather, please call our main line at [phone] for an update on the status of any delays or closures.  Dermatology Medication Tips: Please keep the boxes that topical medications come in in order to help keep track of the instructions about where and how to use these. Pharmacies typically print the medication instructions only on the boxes and not directly on the medication tubes.   If your medication is too expensive, please contact our office at [phone] option 4 or send us a message through MyChart.   We are unable to tell what your co-pay for medications will be in advance as this is different depending on your insurance coverage. However, we may be able to find a substitute medication at lower cost or fill out paperwork to get insurance to cover a needed medication.   If a prior authorization is required to get your medication covered by   your insurance company, please allow us 1-2 business days to complete this process.  Drug prices often vary depending on where the prescription is filled and some pharmacies may offer cheaper prices.  The website www.goodrx.com contains coupons for medications through different pharmacies. The prices here do not account for what the cost may be with help from insurance (it may be cheaper with your insurance), but the website can give you the price if you did not use any insurance.  - You can print the associated coupon and take it with your prescription to the pharmacy.  - You may also stop by our office during regular business hours and pick up a GoodRx coupon card.  - If you need your prescription sent electronically to a different pharmacy, notify our office through [place] MyChart or by phone at [phone] option 4.     Si Usted Necesita Algo Despus de Su Visita  Tambin puede enviarnos un mensaje a travs de MyChart. Por lo general  respondemos a los mensajes de MyChart en el transcurso de 1 a 2 das hbiles.  Para renovar recetas, por favor pida a su farmacia que se ponga en contacto con nuestra oficina. Nuestro nmero de fax es el [phone].  Si tiene un asunto urgente cuando la clnica est cerrada y que no puede esperar hasta el siguiente da hbil, puede llamar/localizar a su doctor(a) al nmero que aparece a continuacin.   Por favor, tenga en cuenta que aunque hacemos todo lo posible para estar disponibles para asuntos urgentes fuera del horario de oficina, no estamos disponibles las 24 horas del da, los 7 das de la semana.   Si tiene un problema urgente y no puede comunicarse con nosotros, puede optar por buscar atencin mdica  en el consultorio de su doctor(a), en una clnica privada, en un centro de atencin urgente o en una sala de emergencias.  Si tiene una emergencia mdica, por favor llame inmediatamente al 911 o vaya a la sala de emergencias.  Nmeros de bper  - Dr. Kowalski: [phone]  - Dra. Moye: [phone]  - Dra. Stewart: [phone]  En caso de inclemencias del tiempo, por favor llame a nuestra lnea principal al [phone] para una actualizacin sobre el estado de cualquier retraso o cierre.  Consejos para la medicacin en dermatologa: Por favor, guarde las cajas en las que vienen los medicamentos de uso tpico para ayudarle a seguir las instrucciones sobre dnde y cmo usarlos. Las farmacias generalmente imprimen las instrucciones del medicamento slo en las cajas y no directamente en los tubos del medicamento.   Si su medicamento es muy caro, por favor, pngase en contacto con nuestra oficina llamando al [phone] y presione la opcin 4 o envenos un mensaje a travs de MyChart.   No podemos decirle cul ser su copago por los medicamentos por adelantado ya que esto es diferente dependiendo de la cobertura de su seguro. Sin embargo, es posible que podamos encontrar un  medicamento sustituto a menor costo o llenar un formulario para que el seguro cubra el medicamento que se considera necesario.   Si se requiere una autorizacin previa para que su compaa de seguros cubra su medicamento, por favor permtanos de 1 a 2 das hbiles para completar este proceso.  Los precios de los medicamentos varan con frecuencia dependiendo del lugar de dnde se surte la receta y alguna farmacias pueden ofrecer precios ms baratos.  El sitio web www.goodrx.com tiene cupones para medicamentos de diferentes farmacias. Los precios aqu no tienen   en cuenta lo que podra costar con la ayuda del seguro (puede ser ms barato con su seguro), pero el sitio web puede darle el precio si no utiliz ningn seguro.  - Puede imprimir el cupn correspondiente y llevarlo con su receta a la farmacia.  - Tambin puede pasar por nuestra oficina durante el horario de atencin regular y recoger una tarjeta de cupones de GoodRx.  - Si necesita que su receta se enve electrnicamente a una farmacia diferente, informe a nuestra oficina a travs de MyChart de [place] o por telfono llamando al [phone] y presione la opcin 4.  

## 2022-05-20 NOTE — Telephone Encounter (Signed)
LVM to inform pt that the Sleep study found mild sleep apnea.  Recommended Cpap machine.  Please make an appt to discuss if she would like

## 2022-05-20 NOTE — Progress Notes (Signed)
   Follow-Up Visit   Subjective  Brittney Ruiz is a 57 y.o. female who presents for the following: Procedure (Patient here today for excision of cyst.).   The following portions of the chart were reviewed this encounter and updated as appropriate:   Tobacco  Allergies  Meds  Problems  Med Hx  Surg Hx  Fam Hx      Review of Systems:  No other skin or systemic complaints except as noted in HPI or Assessment and Plan.  Objective  Well appearing patient in no apparent distress; mood and affect are within normal limits.  A focused examination was performed including groin. Relevant physical exam findings are noted in the Assessment and Plan.  left inguinal fold 0.8 cm subcutaneous papule    Assessment & Plan  Neoplasm of skin left inguinal fold  Skin excision  Lesion length (cm):  0.8 Total excision diameter (cm):  0.8 Informed consent: discussed and consent obtained   Timeout: patient name, date of birth, surgical site, and procedure verified   Procedure prep:  Patient was prepped and draped in usual sterile fashion Prep type:  Chlorhexidine Anesthesia: the lesion was anesthetized in a standard fashion   Local anesthetic: 9 cc bupivicaine. Hemostasis achieved with: pressure and electrodesiccation    Skin repair Complexity:  Intermediate Final length (cm):  1.2 Informed consent: discussed and consent obtained   Timeout: patient name, date of birth, surgical site, and procedure verified   Procedure prep:  Patient was prepped and draped in usual sterile fashion Prep type:  Chlorhexidine Anesthesia: the lesion was anesthetized in a standard fashion   Anesthetic:  1% lidocaine w/ epinephrine 1-100,000 local infiltration Reason for type of repair: reduce tension to allow closure, reduce the risk of dehiscence, infection, and necrosis, preserve normal anatomy and preserve normal anatomical and functional relationships   Undermining: edges undermined   Subcutaneous layers  (deep stitches):  Suture size:  4-0 Suture type: Vicryl (polyglactin 910)   Fine/surface layer approximation (top stitches):  Suture size:  5-0 Suture type: nylon   Suture removal (days):  7 Hemostasis achieved with: suture, pressure and electrodesiccation Outcome: patient tolerated procedure well with no complications   Post-procedure details: wound care instructions given   Additional details:  Mupirocin and a pressure dressing applied  mupirocin ointment (BACTROBAN) 2 % Apply 1 Application topically daily.  Specimen 1 - Surgical pathology Differential Diagnosis: r/o cyst vs Other  Check Margins: No 0.8 cm subcutaneous papule   Return in about 1 week (around 05/27/2022) for Suture Removal.  Graciella Belton, RMA, am acting as scribe for Forest Gleason, MD .  Documentation: I have reviewed the above documentation for accuracy and completeness, and I agree with the above.  Forest Gleason, MD

## 2022-05-20 NOTE — Telephone Encounter (Signed)
Call pt  Sleep study found mild sleep apnea.  Recommended Cpap machine.  Please make an appt to discuss if she would like

## 2022-05-21 ENCOUNTER — Telehealth: Payer: Self-pay

## 2022-05-21 NOTE — Telephone Encounter (Signed)
Called patient to see how she is doing after yesterday's surgery. No answer. Left message for patient that she should call office if any concerns or page Dr. Laurence Ferrari if office is closed. Lurlean Horns., RMA

## 2022-05-25 ENCOUNTER — Telehealth (INDEPENDENT_AMBULATORY_CARE_PROVIDER_SITE_OTHER): Payer: Medicare HMO | Admitting: Family Medicine

## 2022-05-25 DIAGNOSIS — K5909 Other constipation: Secondary | ICD-10-CM | POA: Diagnosis not present

## 2022-05-25 DIAGNOSIS — Z6836 Body mass index (BMI) 36.0-36.9, adult: Secondary | ICD-10-CM

## 2022-05-25 DIAGNOSIS — G4733 Obstructive sleep apnea (adult) (pediatric): Secondary | ICD-10-CM | POA: Diagnosis not present

## 2022-05-25 DIAGNOSIS — E669 Obesity, unspecified: Secondary | ICD-10-CM | POA: Diagnosis not present

## 2022-05-25 NOTE — Telephone Encounter (Signed)
Looks like pt is scheduled with PCP on  1/15

## 2022-05-26 ENCOUNTER — Encounter (INDEPENDENT_AMBULATORY_CARE_PROVIDER_SITE_OTHER): Payer: Self-pay | Admitting: Family Medicine

## 2022-05-26 DIAGNOSIS — F4001 Agoraphobia with panic disorder: Secondary | ICD-10-CM | POA: Diagnosis not present

## 2022-05-26 DIAGNOSIS — F410 Panic disorder [episodic paroxysmal anxiety] without agoraphobia: Secondary | ICD-10-CM | POA: Diagnosis not present

## 2022-05-26 DIAGNOSIS — R69 Illness, unspecified: Secondary | ICD-10-CM | POA: Diagnosis not present

## 2022-05-26 DIAGNOSIS — F39 Unspecified mood [affective] disorder: Secondary | ICD-10-CM | POA: Diagnosis not present

## 2022-05-26 DIAGNOSIS — F411 Generalized anxiety disorder: Secondary | ICD-10-CM | POA: Diagnosis not present

## 2022-05-27 ENCOUNTER — Encounter: Payer: Self-pay | Admitting: Dermatology

## 2022-05-27 ENCOUNTER — Ambulatory Visit (INDEPENDENT_AMBULATORY_CARE_PROVIDER_SITE_OTHER): Payer: Medicare HMO | Admitting: Dermatology

## 2022-05-27 DIAGNOSIS — Z4802 Encounter for removal of sutures: Secondary | ICD-10-CM

## 2022-05-27 DIAGNOSIS — L720 Epidermal cyst: Secondary | ICD-10-CM

## 2022-05-27 NOTE — Progress Notes (Signed)
   Follow-Up Visit   Subjective  Brittney Ruiz is a 57 y.o. female who presents for the following: Suture / Staple Removal. Biopsy proven EPIDERMAL INCLUSION CYST at the left inguinal fold.  The following portions of the chart were reviewed this encounter and updated as appropriate:   Tobacco  Allergies  Meds  Problems  Med Hx  Surg Hx  Fam Hx     Review of Systems:  No other skin or systemic complaints except as noted in HPI or Assessment and Plan.  Objective  Well appearing patient in no apparent distress; mood and affect are within normal limits.  A focused examination was performed including left leg. Relevant physical exam findings are noted in the Assessment and Plan.  left inguinal fold Incision site is clean, dry and intact     Assessment & Plan  Epidermal inclusion cyst left inguinal fold  Biopsy proven EPIDERMAL INCLUSION CYST discussed   Encounter for Removal of Sutures - Incision site at the left inguinal fold  is clean, dry and intact - Wound cleansed, sutures removed,  - Discussed pathology results showing EPIDERMAL INCLUSION CYST    - Scars remodel for a full year. - Patient advised to call with any concerns or if they notice any new or changing lesions.   Return if symptoms worsen or fail to improve.  I, Marye Round, CMA, am acting as scribe for Forest Gleason, MD .   Documentation: I have reviewed the above documentation for accuracy and completeness, and I agree with the above.  Forest Gleason, MD

## 2022-05-27 NOTE — Patient Instructions (Signed)
Due to recent changes in healthcare laws, you may see results of your pathology and/or laboratory studies on MyChart before the doctors have had a chance to review them. We understand that in some cases there may be results that are confusing or concerning to you. Please understand that not all results are received at the same time and often the doctors may need to interpret multiple results in order to provide you with the best plan of care or course of treatment. Therefore, we ask that you please give us 2 business days to thoroughly review all your results before contacting the office for clarification. Should we see a critical lab result, you will be contacted sooner.   If You Need Anything After Your Visit  If you have any questions or concerns for your doctor, please call our main line at [phone] and press option 4 to reach your doctor's medical assistant. If no one answers, please leave a voicemail as directed and we will return your call as soon as possible. Messages left after 4 pm will be answered the following business day.   You may also send us a message via MyChart. We typically respond to MyChart messages within 1-2 business days.  For prescription refills, please ask your pharmacy to contact our office. Our fax number is [phone].  If you have an urgent issue when the clinic is closed that cannot wait until the next business day, you can page your doctor at the number below.    Please note that while we do our best to be available for urgent issues outside of office hours, we are not available 24/7.   If you have an urgent issue and are unable to reach us, you may choose to seek medical care at your doctor's office, retail clinic, urgent care center, or emergency room.  If you have a medical emergency, please immediately call 911 or go to the emergency department.  Pager Numbers  - Dr. Kowalski: [phone]  - Dr. Moye: [phone]  - Dr. Stewart:  [phone]  In the event of inclement weather, please call our main line at [phone] for an update on the status of any delays or closures.  Dermatology Medication Tips: Please keep the boxes that topical medications come in in order to help keep track of the instructions about where and how to use these. Pharmacies typically print the medication instructions only on the boxes and not directly on the medication tubes.   If your medication is too expensive, please contact our office at [phone] option 4 or send us a message through MyChart.   We are unable to tell what your co-pay for medications will be in advance as this is different depending on your insurance coverage. However, we may be able to find a substitute medication at lower cost or fill out paperwork to get insurance to cover a needed medication.   If a prior authorization is required to get your medication covered by your insurance company, please allow us 1-2 business days to complete this process.  Drug prices often vary depending on where the prescription is filled and some pharmacies may offer cheaper prices.  The website www.goodrx.com contains coupons for medications through different pharmacies. The prices here do not account for what the cost may be with help from insurance (it may be cheaper with your insurance), but the website can give you the price if you did not use any insurance.  - You can print the associated coupon and take it with   your prescription to the pharmacy.  - You may also stop by our office during regular business hours and pick up a GoodRx coupon card.  - If you need your prescription sent electronically to a different pharmacy, notify our office through [facility] MyChart or by phone at [phone] option 4.     Si Usted Necesita Algo Despus de Su Visita  Tambin puede enviarnos un mensaje a travs de MyChart. Por lo general respondemos a los mensajes de MyChart en el transcurso de 1 a 2  das hbiles.  Para renovar recetas, por favor pida a su farmacia que se ponga en contacto con nuestra oficina. Nuestro nmero de fax es el [phone].  Si tiene un asunto urgente cuando la clnica est cerrada y que no puede esperar hasta el siguiente da hbil, puede llamar/localizar a su doctor(a) al nmero que aparece a continuacin.   Por favor, tenga en cuenta que aunque hacemos todo lo posible para estar disponibles para asuntos urgentes fuera del horario de oficina, no estamos disponibles las 24 horas del da, los 7 das de la semana.   Si tiene un problema urgente y no puede comunicarse con nosotros, puede optar por buscar atencin mdica  en el consultorio de su doctor(a), en una clnica privada, en un centro de atencin urgente o en una sala de emergencias.  Si tiene una emergencia mdica, por favor llame inmediatamente al 911 o vaya a la sala de emergencias.  Nmeros de bper  - Dr. Kowalski: [phone]  - Dra. Moye: [phone]  - Dra. Stewart: [phone]  En caso de inclemencias del tiempo, por favor llame a nuestra lnea principal al [phone] para una actualizacin sobre el estado de cualquier retraso o cierre.  Consejos para la medicacin en dermatologa: Por favor, guarde las cajas en las que vienen los medicamentos de uso tpico para ayudarle a seguir las instrucciones sobre dnde y cmo usarlos. Las farmacias generalmente imprimen las instrucciones del medicamento slo en las cajas y no directamente en los tubos del medicamento.   Si su medicamento es muy caro, por favor, pngase en contacto con nuestra oficina llamando al [phone] y presione la opcin 4 o envenos un mensaje a travs de MyChart.   No podemos decirle cul ser su copago por los medicamentos por adelantado ya que esto es diferente dependiendo de la cobertura de su seguro. Sin embargo, es posible que podamos encontrar un medicamento sustituto a menor costo o llenar un formulario para que el  seguro cubra el medicamento que se considera necesario.   Si se requiere una autorizacin previa para que su compaa de seguros cubra su medicamento, por favor permtanos de 1 a 2 das hbiles para completar este proceso.  Los precios de los medicamentos varan con frecuencia dependiendo del lugar de dnde se surte la receta y alguna farmacias pueden ofrecer precios ms baratos.  El sitio web www.goodrx.com tiene cupones para medicamentos de diferentes farmacias. Los precios aqu no tienen en cuenta lo que podra costar con la ayuda del seguro (puede ser ms barato con su seguro), pero el sitio web puede darle el precio si no utiliz ningn seguro.  - Puede imprimir el cupn correspondiente y llevarlo con su receta a la farmacia.  - Tambin puede pasar por nuestra oficina durante el horario de atencin regular y recoger una tarjeta de cupones de GoodRx.  - Si necesita que su receta se enve electrnicamente a una farmacia diferente, informe a nuestra oficina a travs de MyChart de [facility]   o por telfono llamando al [phone] y presione la opcin 4.  

## 2022-06-01 ENCOUNTER — Encounter: Payer: Self-pay | Admitting: Family

## 2022-06-01 ENCOUNTER — Telehealth (INDEPENDENT_AMBULATORY_CARE_PROVIDER_SITE_OTHER): Payer: Medicare HMO | Admitting: Family

## 2022-06-01 VITALS — BP 119/79 | HR 62 | Ht 61.5 in | Wt 192.4 lb

## 2022-06-01 DIAGNOSIS — G4733 Obstructive sleep apnea (adult) (pediatric): Secondary | ICD-10-CM

## 2022-06-01 NOTE — Assessment & Plan Note (Addendum)
Discussed recent diagnosed of sleep apnea.  Discussed long-term sequela of not treating sleep apnea.  Advised the patient would not require any additional home sleep study next month as arranged previously by neurology and that she may cancel.   We will send patient and neurology a copy of this report.  Patient plans to go to medical supply store in regards to supplies, starting CPAP.

## 2022-06-01 NOTE — Progress Notes (Signed)
Virtual Visit via Video Note  I connected with Brittney Ruiz on 06/01/22 at  4:00 PM EST by a video enabled telemedicine application and verified that I am speaking with the correct person using two identifiers. Location patient: home Location provider: work  Persons participating in the virtual visit: patient, provider  I discussed the limitations of evaluation and management by telemedicine and the availability of in person appointments. The patient expressed understanding and agreed to proceed.  HPI: Complains of daytime fatigue, weight gain which prompted OSA evaluation last month with [facility].    She is doing 3 days per week, with stretching bands, side stepping. She was in PT most of last year without formal exercise. She gets 'winded' but she is gradually increasing exercise.DOE is unchanged.   She hasn't had to use nebulizer , albuterol.   Reports initial OSA recommendation by neurology , home sleep test scheduled 06/29/22.  She desired earlier study and contacted [facility]  Recent diagnosis of sleep apnea with continuous oxygen.      ROS: See pertinent positives and negatives per HPI.  EXAM:  VITALS per patient if applicable: BP 485/46   Pulse 62   Ht 5' 1.5" (1.562 m)   Wt 192 lb 6.4 oz (87.3 kg)   LMP  (LMP Unknown)   SpO2 98%   BMI 35.76 kg/m  BP Readings from Last 3 Encounters:  06/01/22 119/79  03/27/22 138/78  03/09/22 116/81   Wt Readings from Last 3 Encounters:  06/01/22 192 lb 6.4 oz (87.3 kg)  03/27/22 192 lb 6.4 oz (87.3 kg)  03/09/22 197 lb (89.4 kg)    GENERAL: alert, oriented, appears well and in no acute distress  HEENT: atraumatic, conjunttiva clear, no obvious abnormalities on inspection of external nose and ears  NECK: normal movements of the head and neck  LUNGS: on inspection no signs of respiratory distress, breathing rate appears normal, no obvious gross SOB, gasping or wheezing  CV: no obvious cyanosis  MS: moves all visible extremities  without noticeable abnormality  PSYCH/NEURO: pleasant and cooperative, no obvious depression or anxiety, speech and thought processing grossly intact  ASSESSMENT AND PLAN: OSA (obstructive sleep apnea) Assessment & Plan: Discussed recent diagnosed of sleep apnea.  Discussed long-term sequela of not treating sleep apnea.  Advised the patient would not require any additional home sleep study next month as arranged previously by neurology and that she may cancel.   We will send patient and neurology a copy of this report.  Patient plans to go to medical supply store in regards to supplies, starting CPAP.      -we discussed possible serious and likely etiologies, options for evaluation and workup, limitations of telemedicine visit vs in person visit, treatment, treatment risks and precautions. Pt prefers to treat via telemedicine empirically rather then risking or undertaking an in person visit at this moment.    I discussed the assessment and treatment plan with the patient. The patient was provided an opportunity to ask questions and all were answered. The patient agreed with the plan and demonstrated an understanding of the instructions.   The patient was advised to call back or seek an in-person evaluation if the symptoms worsen or if the condition fails to improve as anticipated.  Advised if desired AVS can be mailed or viewed via [facility] if [facility] user.   Mable Paris, FNP

## 2022-06-05 ENCOUNTER — Encounter: Payer: Self-pay | Admitting: Dermatology

## 2022-06-08 ENCOUNTER — Telehealth: Payer: Self-pay | Admitting: Family

## 2022-06-08 NOTE — Progress Notes (Signed)
TeleHealth Visit:  Due to the COVID-19 pandemic, this visit was completed with telemedicine (audio/video) technology to reduce patient and provider exposure as well as to preserve personal protective equipment.   Brittney Ruiz has verbally consented to this TeleHealth visit. The patient is located at home, the provider is located at the [facility] office. The participants in this visit include the listed provider and patient. The visit was conducted today via video visit.  Chief Complaint: OBESITY Brittney Ruiz is here to discuss her progress with her obesity treatment plan along with follow-up of her obesity related diagnoses. Brittney Ruiz is on keeping a food journal and adhering to recommended goals of 1300-1400 calories and 80+ protein and states she is following her eating plan approximately 90% of the time. Brittney Ruiz states she is on upper body exercises 3 times per week.  Today's visit was #: 47 Starting weight: 64 LBS Starting date: 06/01/2019  Interim History: Patient had inguinal cyst removed last week but had to pause her workouts  because of sutures.  Patient saw Jake Bathe, NP in November and had COVID-19 at that time.  She added 2 servings of healthy fat daily.  Got a little off track with eating over the holidays.  Patient denies hunger.  1. Chronic constipation Patient has been able to eat salads this includes veggies.  Constipation has improved with steel cut oats, Chia seeds, flaxseeds.  2. OSA (obstructive sleep apnea) Patient has been newly diagnosed with OSA.  She is awaiting CPAP and oxygen at nighttime.  Patient has daytime somnolence.  Assessment/Plan:   1. Chronic constipation Continue to drink 64 ounces of water or more daily.  Continue a high-fiber diet.  2. OSA (obstructive sleep apnea) Look for improved energy levels and weight reduction 1 starting CPAP.  3. Obesity,current BMI 36.03 Reviewed overall progress, patient is down 17 LB in 2 years of medically  supervised weight loss= 7.8% TBW loss.  Brittney Ruiz is currently in the action stage of change. As such, her goal is to continue with weight loss efforts. She has agreed to the Category 2 Plan and keeping a food journal and adhering to recommended goals of 1300-1400 calories and 80+ protein.   Exercise goals: All adults should avoid inactivity. Some physical activity is better than none, and adults who participate in any amount of physical activity gain some health benefits.  Behavioral modification strategies: increasing lean protein intake, increasing vegetables, increasing water intake, decreasing eating out, no skipping meals, meal planning and cooking strategies, keeping healthy foods in the home, and planning for success.  Brittney Ruiz has agreed to follow-up with our clinic in 4 weeks. She was informed of the importance of frequent follow-up visits to maximize her success with intensive lifestyle modifications for her multiple health conditions.  Objective:   VITALS: Per patient if applicable, see vitals. GENERAL: Alert and in no acute distress. CARDIOPULMONARY: No increased WOB. Speaking in clear sentences.  PSYCH: Pleasant and cooperative. Speech normal rate and rhythm. Affect is appropriate. Insight and judgement are appropriate. Attention is focused, linear, and appropriate.  NEURO: Oriented as arrived to appointment on time with no prompting.   Lab Results  Component Value Date   CREATININE 0.63 02/02/2022   BUN 20 02/02/2022   NA 142 02/02/2022   K 4.5 02/02/2022   CL 102 02/02/2022   CO2 24 02/02/2022   Lab Results  Component Value Date   ALT 33 (H) 02/02/2022   AST 27 02/02/2022   ALKPHOS 81 02/02/2022  BILITOT 0.5 02/02/2022   Lab Results  Component Value Date   HGBA1C 5.5 02/02/2022   HGBA1C 5.3 06/09/2021   HGBA1C 5.5 12/04/2020   HGBA1C 5.5 08/08/2020   HGBA1C 5.4 05/07/2020   Lab Results  Component Value Date   INSULIN 16.0 02/02/2022   INSULIN 8.2 06/09/2021    INSULIN 10.4 12/04/2020   INSULIN 10.7 08/08/2020   INSULIN 8.2 05/07/2020   Lab Results  Component Value Date   TSH 1.48 03/03/2021   Lab Results  Component Value Date   CHOL 216 (H) 02/02/2022   HDL 37 (L) 02/02/2022   LDLCALC 151 (H) 02/02/2022   LDLDIRECT 141.0 05/02/2018   TRIG 152 (H) 02/02/2022   CHOLHDL 7.4 (H) 06/09/2021   Lab Results  Component Value Date   VD25OH 48.7 06/09/2021   VD25OH 58.9 12/04/2020   VD25OH 45.2 08/08/2020   Lab Results  Component Value Date   WBC 7.5 10/07/2020   HGB 15.4 (H) 10/07/2020   HCT 45.5 10/07/2020   MCV 86.2 10/07/2020   PLT 152 10/07/2020   Lab Results  Component Value Date   IRON 77 04/21/2021   TIBC 283 04/21/2021   FERRITIN 336 (H) 04/21/2021   Attestation Statements:   Reviewed by clinician on day of visit: allergies, medications, problem list, medical history, surgical history, family history, social history, and previous encounter notes.  I, Davy Pique, am acting as Location manager for Loyal Gambler, DO.  I have reviewed the above documentation for accuracy and completeness, and I agree with the above. Dell Ponto, DO

## 2022-06-08 NOTE — Telephone Encounter (Signed)
Pt called stating [facility] need a new order for a sleep study faxed to [phone]. Pt would like to be called.

## 2022-06-09 NOTE — Telephone Encounter (Signed)
Spoke to pt and she stated that they need new order because she had not responded to them in a timely manner so they canceled her order for C-PAP. Pt does not need new order for sleep study. She needs new C-PAP order to be  faxed to [place] @ [phone]

## 2022-06-15 NOTE — Telephone Encounter (Signed)
Patient returned call from Jenate Martinique, Kelly.  Domingo Mend was not available at the time of the call.  I let patient know I will leave a message for her letting her know she returned her call.

## 2022-06-15 NOTE — Telephone Encounter (Signed)
LVM to call back to office  

## 2022-06-19 NOTE — Telephone Encounter (Signed)
Spoke to pt and she gave me Russell's contact information @ [facility]....LVM for Brittney Ruiz to give me a call back to see if we can get this C-PAP situation back on the right track   Office #:[phone]  Cell # [phone]  Fax [phone]

## 2022-06-23 NOTE — Telephone Encounter (Signed)
Spoke to pt and faxed orders to [facility] and [facility] on 06/23/22

## 2022-06-24 NOTE — Telephone Encounter (Signed)
Please call pt to reschedule.

## 2022-06-25 NOTE — Telephone Encounter (Signed)
noted 

## 2022-06-28 DIAGNOSIS — G4719 Other hypersomnia: Secondary | ICD-10-CM | POA: Diagnosis not present

## 2022-06-28 DIAGNOSIS — R0683 Snoring: Secondary | ICD-10-CM | POA: Diagnosis not present

## 2022-07-06 ENCOUNTER — Telehealth: Payer: Self-pay | Admitting: Family

## 2022-07-06 ENCOUNTER — Encounter (INDEPENDENT_AMBULATORY_CARE_PROVIDER_SITE_OTHER): Payer: Self-pay | Admitting: Family Medicine

## 2022-07-06 ENCOUNTER — Ambulatory Visit (INDEPENDENT_AMBULATORY_CARE_PROVIDER_SITE_OTHER): Payer: Medicare HMO | Admitting: Family Medicine

## 2022-07-06 VITALS — BP 114/80 | HR 67 | Temp 98.6°F | Ht 62.0 in | Wt 201.0 lb

## 2022-07-06 DIAGNOSIS — Z6836 Body mass index (BMI) 36.0-36.9, adult: Secondary | ICD-10-CM | POA: Diagnosis not present

## 2022-07-06 DIAGNOSIS — R7303 Prediabetes: Secondary | ICD-10-CM

## 2022-07-06 DIAGNOSIS — M722 Plantar fascial fibromatosis: Secondary | ICD-10-CM | POA: Insufficient documentation

## 2022-07-06 DIAGNOSIS — G4733 Obstructive sleep apnea (adult) (pediatric): Secondary | ICD-10-CM | POA: Diagnosis not present

## 2022-07-06 NOTE — Assessment & Plan Note (Signed)
Taking Sertraline as prescribed below.

## 2022-07-06 NOTE — Assessment & Plan Note (Signed)
Diagnosed with mild OSA with hypoxemia, awaiting CPAP + nocturnal O2.  Reports awaiting a 2nd sleep study that will be done at [facility].  Look for improvements in fatigue and improved weight reduction once treatment is started.

## 2022-07-06 NOTE — Assessment & Plan Note (Signed)
Lab Results  Component Value Date   HGBA1C 5.5 02/02/2022    She has never used metformin.  She has fear of using metformin. Her fasting insulin was 16 in September. She is working on her prescribed diet but lacks exercise.

## 2022-07-06 NOTE — Progress Notes (Signed)
Office: [phone]  /  Fax: [phone]  WEIGHT SUMMARY AND BIOMETRICS  Medical Weight Loss Height: 5' 2"$  (1.575 m) Weight: 201 lb (91.2 kg) Temp: 98.6 F (37 C) Pulse Rate: 67 BP: 114/80 SpO2: 98 % Fasting: no Labs: no Today's Visit #: 36 Weight at Last VIsit: 197lb Weight Lost Since Last Visit: +4  Body Fat %: 47.2 % Fat Mass (lbs): 95.2 lbs Muscle Mass (lbs): 101 lbs Total Body Water (lbs): 77 lbs Visceral Fat Rating : 13 Starting Date: 06/01/19 Starting Weight: 217lb Total Weight Loss (lbs): 13 lb (5.897 kg)    HPI  Chief Complaint: OBESITY  Brittney Ruiz is here to discuss her progress with her obesity treatment plan. She is on the the Category 2 Plan and states she is following her eating plan approximately 80 % of the time. She states she is not exercising.   Interval History:  Since last office visit she up 6 lbs since her last visit. She had an cyst removed from her inner thigh and this limited her exercise.  She plans to start back next week. She c/o plantar fasciitis pain that limits her walking despite home PT stretches She is awaiting a 2nd polysomnogram- diagnosed with OSA w/ hypoxemia needing CPAP and nocturnal oxygen She has a new puppy and family stressors She is averaging < 3,000 steps per day  Net weight loss is 11 lb in 3 years of medically supervised weight management She feels like her 'hormones are of' and this may be contributing to weight gain.  She had a TAH w/o BSO.  Plan for labs next visit.  Pharmacotherapy: none  PHYSICAL EXAM:  Blood pressure 114/80, pulse 67, temperature 98.6 F (37 C), height 5' 2"$  (1.575 m), weight 201 lb (91.2 kg), SpO2 98 %. Body mass index is 36.76 kg/m.  General: She is overweight, cooperative, alert, well developed, and in no acute distress. PSYCH: Has normal mood, affect and thought process.   HEENT: EOMI, sclerae are anicteric. Lungs: Normal breathing effort, no conversational dyspnea. Extremities: No  edema.  Neurologic: No gross sensory or motor deficits. No tremors or fasciculations noted.    DIAGNOSTIC DATA REVIEWED:  BMET    Component Value Date/Time   NA 142 02/02/2022 1451   NA 136 10/06/2012 0434   K 4.5 02/02/2022 1451   K 3.6 10/06/2012 0434   CL 102 02/02/2022 1451   CL 104 10/06/2012 0434   CO2 24 02/02/2022 1451   CO2 26 10/06/2012 0434   GLUCOSE 96 02/02/2022 1451   GLUCOSE 92 03/03/2021 1412   GLUCOSE 114 (H) 10/06/2012 0434   BUN 20 02/02/2022 1451   BUN 7 10/06/2012 0434   CREATININE 0.63 02/02/2022 1451   CREATININE 0.73 08/09/2015 1609   CALCIUM 9.5 02/02/2022 1451   CALCIUM 9.3 10/06/2012 0434   GFRNONAA >60 10/07/2020 1506   GFRNONAA >60 10/06/2012 0434   GFRAA 107 05/07/2020 1302   GFRAA >60 10/06/2012 0434   Lab Results  Component Value Date   HGBA1C 5.5 02/02/2022   HGBA1C 5.4 10/02/2014   Lab Results  Component Value Date   INSULIN 16.0 02/02/2022   INSULIN 26.1 (H) 06/01/2019   Lab Results  Component Value Date   TSH 1.48 03/03/2021   CBC    Component Value Date/Time   WBC 7.5 10/07/2020 1506   RBC 5.28 (H) 10/07/2020 1506   HGB 15.4 (H) 10/07/2020 1506   HGB 14.5 06/01/2019 1235   HCT 45.5 10/07/2020 1506   HCT  43.1 06/01/2019 1235   PLT 152 10/07/2020 1506   PLT 175 06/01/2019 1235   MCV 86.2 10/07/2020 1506   MCV 86 06/01/2019 1235   MCV 86 10/06/2012 0434   MCH 29.2 10/07/2020 1506   MCHC 33.8 10/07/2020 1506   RDW 13.3 10/07/2020 1506   RDW 13.6 06/01/2019 1235   RDW 13.8 10/06/2012 0434   Iron Studies    Component Value Date/Time   IRON 77 04/21/2021 1130   TIBC 283 04/21/2021 1130   FERRITIN 336 (H) 04/21/2021 1133   IRONPCTSAT 27 04/21/2021 1130   Lipid Panel     Component Value Date/Time   CHOL 216 (H) 02/02/2022 1451   TRIG 152 (H) 02/02/2022 1451   HDL 37 (L) 02/02/2022 1451   CHOLHDL 7.4 (H) 06/09/2021 1443   CHOLHDL 8 05/02/2018 1457   VLDL 72.0 (H) 05/02/2018 1457   LDLCALC 151 (H) 02/02/2022  1451   LDLDIRECT 141.0 05/02/2018 1457   Hepatic Function Panel     Component Value Date/Time   PROT 6.6 02/02/2022 1451   PROT 7.3 10/06/2012 0434   ALBUMIN 4.7 02/02/2022 1451   ALBUMIN 4.2 10/06/2012 0434   AST 27 02/02/2022 1451   AST 17 10/06/2012 0434   ALT 33 (H) 02/02/2022 1451   ALT 27 10/06/2012 0434   ALKPHOS 81 02/02/2022 1451   ALKPHOS 64 10/06/2012 0434   BILITOT 0.5 02/02/2022 1451   BILITOT 0.6 10/06/2012 0434   BILIDIR 0.1 03/24/2021 1502      Component Value Date/Time   TSH 1.48 03/03/2021 1412   Nutritional Lab Results  Component Value Date   VD25OH 48.7 06/09/2021   VD25OH 58.9 12/04/2020   VD25OH 45.2 08/08/2020     ASSESSMENT AND PLAN  TREATMENT PLAN FOR OBESITY:  Recommended Dietary Goals  Phi is currently in the action stage of change. As such, her goal is to continue weight management plan. She has agreed to keeping a food journal and adhering to recommended goals of 1600 calories and 100 g of  protein. (This was adjusted up from 1500 cal) Needs to log daily on the MyFitnessPal ap( 40% carb/ 30% protein/ 30% fat)  Behavioral Intervention  We discussed the following Behavioral Modification Strategies today: increasing lean protein intake, increasing vegetables, avoid skipping meals, increase water intake, work on meal planning and easy cooking plans, think about ways to increase physical activity, and reading food labels and making healthy choices when eating convenient foods.  Additional resources provided today: NA  Recommended Physical Activity Goals  Maianh has been advised to work up to 150 minutes of moderate intensity aerobic activity a week and strengthening exercises 2-3 times per week for cardiovascular health, weight loss maintenance and preservation of muscle mass.   She has agreed to increase physical activity in their day and reduce sedentary time (increase NEAT).  and Patient also encouraged on scheduling and tracking  physical activity.    Pharmacotherapy We discussed various medication options to help Javeah with her weight loss efforts and we both agreed to none.  ASSOCIATED CONDITIONS ADDRESSED TODAY  Prediabetes Assessment & Plan: Lab Results  Component Value Date   HGBA1C 5.5 02/02/2022    She has never used metformin.  She has fear of using metformin. Her fasting insulin was 16 in September. She is working on her prescribed diet but lacks exercise.   Morbid obesity (HCC)  BMI 36.0-36.9,adult  OSA (obstructive sleep apnea) Assessment & Plan: Diagnosed with mild OSA with hypoxemia, awaiting CPAP +  nocturnal O2.  Reports awaiting a 2nd sleep study that will be done at [facility].  Look for improvements in fatigue and improved weight reduction once treatment is started.   Plantar fasciitis, bilateral Assessment & Plan: Pt c/o bilateral plantar fasciitis pain despite doing home PT stretches.  This has limited her walking time and she is averaging less than  3,000 steps/ day.  Recommend good arch support in shoes and avoiding barefoot walking.       Return in about 4 weeks (around 08/03/2022).Marland Kitchen She was informed of the importance of frequent follow up visits to maximize her success with intensive lifestyle modifications for her multiple health conditions.   ATTESTASTION STATEMENTS:  Reviewed by clinician on day of visit: allergies, medications, problem list, medical history, surgical history, family history, social history, and previous encounter notes.   I have personally spent 30 minutes total time today in preparation, patient care, nutritional counseling and documentation for this visit, including the following: review of clinical lab tests; review of medical tests/procedures/services.      Dell Ponto, DO

## 2022-07-06 NOTE — Assessment & Plan Note (Signed)
Pt c/o bilateral plantar fasciitis pain despite doing home PT stretches.  This has limited her walking time and she is averaging less than  3,000 steps/ day.  Recommend good arch support in shoes and avoiding barefoot walking.

## 2022-07-06 NOTE — Telephone Encounter (Signed)
Contacted Brittney Ruiz to schedule their annual wellness visit. Appointment made for 07/13/2022.  [place] Team Direct [phone]

## 2022-07-07 ENCOUNTER — Other Ambulatory Visit: Payer: Self-pay | Admitting: Family

## 2022-07-13 ENCOUNTER — Ambulatory Visit (INDEPENDENT_AMBULATORY_CARE_PROVIDER_SITE_OTHER): Payer: Medicare HMO

## 2022-07-13 VITALS — Ht 62.0 in | Wt 201.0 lb

## 2022-07-13 DIAGNOSIS — Z0000 Encounter for general adult medical examination without abnormal findings: Secondary | ICD-10-CM | POA: Diagnosis not present

## 2022-07-13 DIAGNOSIS — Z1211 Encounter for screening for malignant neoplasm of colon: Secondary | ICD-10-CM | POA: Diagnosis not present

## 2022-07-13 NOTE — Patient Instructions (Addendum)
Brittney Ruiz , Thank you for taking time to come for your Medicare Wellness Visit. I appreciate your ongoing commitment to your health goals. Please review the following plan we discussed and let me know if I can assist you in the future.   These are the goals we discussed:  Goals      Healthy Lifestyle     Stay active Healthy diet; monitor sugar intake Join weight watchers        This is a list of the screening recommended for you and due dates:  Health Maintenance  Topic Date Due   Colon Cancer Screening  07/01/2022   Screening for Lung Cancer  07/29/2022   COVID-19 Vaccine (5 - 2023-24 season) 07/29/2022*   Zoster (Shingles) Vaccine (1 of 2) 10/11/2022*   Pap Smear  03/07/2023   Medicare Annual Wellness Visit  07/14/2023   Mammogram  05/08/2024   DTaP/Tdap/Td vaccine (5 - Td or Tdap) 08/13/2031   Flu Shot  Completed   Hepatitis C Screening: USPSTF Recommendation to screen - Ages 18-79 yo.  Completed   HIV Screening  Completed   HPV Vaccine  Aged Out  *Topic was postponed. The date shown is not the original due date.   Conditions/risks identified: none new.   Next appointment: Follow up in one year for your annual wellness visit.   Preventive Care 40-64 Years, Female Preventive care refers to lifestyle choices and visits with your health care provider that can promote health and wellness. What does preventive care include? A yearly physical exam. This is also called an annual well check. Dental exams once or twice a year. Routine eye exams. Ask your health care provider how often you should have your eyes checked. Personal lifestyle choices, including: Daily care of your teeth and gums. Regular physical activity. Eating a healthy diet. Avoiding tobacco and drug use. Limiting alcohol use. Practicing safe sex. Taking low-dose aspirin daily starting at age 81. Taking vitamin and mineral supplements as recommended by your health care provider. What happens during an  annual well check? The services and screenings done by your health care provider during your annual well check will depend on your age, overall health, lifestyle risk factors, and family history of disease. Counseling  Your health care provider may ask you questions about your: Alcohol use. Tobacco use. Drug use. Emotional well-being. Home and relationship well-being. Sexual activity. Eating habits. Work and work Statistician. Method of birth control. Menstrual cycle. Pregnancy history. Screening  You may have the following tests or measurements: Height, weight, and BMI. Blood pressure. Lipid and cholesterol levels. These may be checked every 5 years, or more frequently if you are over 29 years old. Skin check. Lung cancer screening. You may have this screening every year starting at age 45 if you have a 30-pack-year history of smoking and currently smoke or have quit within the past 15 years. Fecal occult blood test (FOBT) of the stool. You may have this test every year starting at age 56. Flexible sigmoidoscopy or colonoscopy. You may have a sigmoidoscopy every 5 years or a colonoscopy every 10 years starting at age 13. Hepatitis C blood test. Hepatitis B blood test. Sexually transmitted disease (STD) testing. Diabetes screening. This is done by checking your blood sugar (glucose) after you have not eaten for a while (fasting). You may have this done every 1-3 years. Mammogram. This may be done every 1-2 years. Talk to your health care provider about when you should start having regular mammograms. This  may depend on whether you have a family history of breast cancer. BRCA-related cancer screening. This may be done if you have a family history of breast, ovarian, tubal, or peritoneal cancers. Pelvic exam and Pap test. This may be done every 3 years starting at age 32. Starting at age 63, this may be done every 5 years if you have a Pap test in combination with an HPV test. Bone  density scan. This is done to screen for osteoporosis. You may have this scan if you are at high risk for osteoporosis. Discuss your test results, treatment options, and if necessary, the need for more tests with your health care provider. Vaccines  Your health care provider may recommend certain vaccines, such as: Influenza vaccine. This is recommended every year. Tetanus, diphtheria, and acellular pertussis (Tdap, Td) vaccine. You may need a Td booster every 10 years. Zoster vaccine. You may need this after age 66. Pneumococcal 13-valent conjugate (PCV13) vaccine. You may need this if you have certain conditions and were not previously vaccinated. Pneumococcal polysaccharide (PPSV23) vaccine. You may need one or two doses if you smoke cigarettes or if you have certain conditions. Talk to your health care provider about which screenings and vaccines you need and how often you need them. This information is not intended to replace advice given to you by your health care provider. Make sure you discuss any questions you have with your health care provider. Document Released: 05/31/2015 Document Revised: 01/22/2016 Document Reviewed: 03/05/2015 Elsevier Interactive Patient Education  2017 [place] Prevention in the Home Falls can cause injuries. They can happen to people of all ages. There are many things you can do to make your home safe and to help prevent falls. What can I do on the outside of my home? Regularly fix the edges of walkways and driveways and fix any cracks. Remove anything that might make you trip as you walk through a door, such as a raised step or threshold. Trim any bushes or trees on the path to your home. Use bright outdoor lighting. Clear any walking paths of anything that might make someone trip, such as rocks or tools. Regularly check to see if handrails are loose or broken. Make sure that both sides of any steps have handrails. Any raised decks and  porches should have guardrails on the edges. Have any leaves, snow, or ice cleared regularly. Use sand or salt on walking paths during winter. Clean up any spills in your garage right away. This includes oil or grease spills. What can I do in the bathroom? Use night lights. Install grab bars by the toilet and in the tub and shower. Do not use towel bars as grab bars. Use non-skid mats or decals in the tub or shower. If you need to sit down in the shower, use a plastic, non-slip stool. Keep the floor dry. Clean up any water that spills on the floor as soon as it happens. Remove soap buildup in the tub or shower regularly. Attach bath mats securely with double-sided non-slip rug tape. Do not have throw rugs and other things on the floor that can make you trip. What can I do in the bedroom? Use night lights. Make sure that you have a light by your bed that is easy to reach. Do not use any sheets or blankets that are too big for your bed. They should not hang down onto the floor. Have a firm chair that has side arms. You  can use this for support while you get dressed. Do not have throw rugs and other things on the floor that can make you trip. What can I do in the kitchen? Clean up any spills right away. Avoid walking on wet floors. Keep items that you use a lot in easy-to-reach places. If you need to reach something above you, use a strong step stool that has a grab bar. Keep electrical cords out of the way. Do not use floor polish or wax that makes floors slippery. If you must use wax, use non-skid floor wax. Do not have throw rugs and other things on the floor that can make you trip. What can I do with my stairs? Do not leave any items on the stairs. Make sure that there are handrails on both sides of the stairs and use them. Fix handrails that are broken or loose. Make sure that handrails are as long as the stairways. Check any carpeting to make sure that it is firmly attached to the  stairs. Fix any carpet that is loose or worn. Avoid having throw rugs at the top or bottom of the stairs. If you do have throw rugs, attach them to the floor with carpet tape. Make sure that you have a light switch at the top of the stairs and the bottom of the stairs. If you do not have them, ask someone to add them for you. What else can I do to help prevent falls? Wear shoes that: Do not have high heels. Have rubber bottoms. Are comfortable and fit you well. Are closed at the toe. Do not wear sandals. If you use a stepladder: Make sure that it is fully opened. Do not climb a closed stepladder. Make sure that both sides of the stepladder are locked into place. Ask someone to hold it for you, if possible. Clearly mark and make sure that you can see: Any grab bars or handrails. First and last steps. Where the edge of each step is. Use tools that help you move around (mobility aids) if they are needed. These include: Canes. Walkers. Scooters. Crutches. Turn on the lights when you go into a dark area. Replace any light bulbs as soon as they burn out. Set up your furniture so you have a clear path. Avoid moving your furniture around. If any of your floors are uneven, fix them. If there are any pets around you, be aware of where they are. Review your medicines with your doctor. Some medicines can make you feel dizzy. This can increase your chance of falling. Ask your doctor what other things that you can do to help prevent falls. This information is not intended to replace advice given to you by your health care provider. Make sure you discuss any questions you have with your health care provider. Document Released: 02/28/2009 Document Revised: 10/10/2015 Document Reviewed: 06/08/2014 Elsevier Interactive Patient Education  2017 [place].   Colonoscopy, Adult A colonoscopy is a procedure to look at the entire large intestine. This procedure is done using a long, thin, flexible tube  that has a camera on the end. You may have a colonoscopy: As a part of normal colorectal screening. If you have certain symptoms, such as: A low number of red blood cells in your blood (anemia). Diarrhea that does not go away. Pain in your abdomen. Blood in your stool. A colonoscopy can help screen for and diagnose medical problems, including: An abnormal growth of cells or tissue (tumor). Abnormal growths within the  lining of your intestine (polyps). Inflammation. Areas of bleeding. Tell your health care provider about: Any allergies you have. All medicines you are taking, including vitamins, herbs, eye drops, creams, and over-the-counter medicines. Any problems you or family members have had with anesthetic medicines. Any bleeding problems you have. Any surgeries you have had. Any medical conditions you have. Any problems you have had with having bowel movements. Whether you are pregnant or may be pregnant. What are the risks? Generally, this is a safe procedure. However, problems may occur, including: Bleeding. Damage to your intestine. Allergic reactions to medicines given during the procedure. Infection. This is rare. What happens before the procedure? Eating and drinking restrictions Follow instructions from your health care provider about eating or drinking restrictions, which may include: A few days before the procedure: Follow a low-fiber diet. Avoid nuts, seeds, dried fruit, raw fruits, and vegetables. 1-3 days before the procedure: Eat only gelatin dessert or ice pops. Drink only clear liquids, such as water, clear juice, clear broth or bouillon, black coffee or tea, or clear soft drinks or sports drinks. Avoid liquids that contain red or purple dye. The day of the procedure: Do not eat solid foods. You may continue to drink clear liquids until up to 2 hours before the procedure. Do not eat or drink anything starting 2 hours before the procedure, or within the time  period that your health care provider recommends. Bowel prep If you were prescribed a bowel prep to take by mouth (orally) to clean out your colon: Take it as told by your health care provider. Starting the day before your procedure, you will need to drink a large amount of liquid medicine. The liquid will cause you to have many bowel movements of loose stool until your stool becomes almost clear or light green. If your skin or the opening between the buttocks (anus) gets irritated from diarrhea, you may relieve the irritation using: Wipes with medicine in them, such as adult wet wipes with aloe and vitamin E. A product to soothe skin, such as petroleum jelly. If you vomit while drinking the bowel prep: Take a break for up to 60 minutes. Begin the bowel prep again. Call your health care provider if you keep vomiting or you cannot take the bowel prep without vomiting. To clean out your colon, you may also be given: Laxative medicines. These help you have a bowel movement. Instructions for enema use. An enema is liquid medicine injected into your rectum. Medicines Ask your health care provider about: Changing or stopping your regular medicines or supplements. This is especially important if you are taking iron supplements, diabetes medicines, or blood thinners. Taking medicines such as aspirin and ibuprofen. These medicines can thin your blood. Do not take these medicines unless your health care provider tells you to take them. Taking over-the-counter medicines, vitamins, herbs, and supplements. General instructions Ask your health care provider what steps will be taken to help prevent infection. These may include washing skin with a germ-killing soap. If you will be going home right after the procedure, plan to have a responsible adult: Take you home from the hospital or clinic. You will not be allowed to drive. Care for you for the time you are told. What happens during the procedure?  An  IV will be inserted into one of your veins. You will be given a medicine to make you fall asleep (general anesthetic). You will lie on your side with your knees bent. A lubricant will be  put on the tube. Then the tube will be: Inserted into your anus. Gently eased through all parts of your large intestine. Air will be sent into your colon to keep it open. This may cause some pressure or cramping. Images will be taken with the camera and will appear on a screen. A small tissue sample may be removed to be looked at under a microscope (biopsy). The tissue may be sent to a lab for testing if any signs of problems are found. If small polyps are found, they may be removed and checked for cancer cells. When the procedure is finished, the tube will be removed. The procedure may vary among health care providers and hospitals. What happens after the procedure? Your blood pressure, heart rate, breathing rate, and blood oxygen level will be monitored until you leave the hospital or clinic. You may have a small amount of blood in your stool. You may pass gas and have mild cramping or bloating in your abdomen. This is caused by the air that was used to open your colon during the exam. If you were given a sedative during the procedure, it can affect you for several hours. Do not drive or operate machinery until your health care provider says that it is safe. It is up to you to get the results of your procedure. Ask your health care provider, or the department that is doing the procedure, when your results will be ready. Summary A colonoscopy is a procedure to look at the entire large intestine. Follow instructions from your health care provider about eating and drinking before the procedure. If you were prescribed an oral bowel prep to clean out your colon, take it as told by your health care provider. During the colonoscopy, a flexible tube with a camera on its end is inserted into the anus and then passed  into all parts of the large intestine. This information is not intended to replace advice given to you by your health care provider. Make sure you discuss any questions you have with your health care provider. Document Revised: 04/28/2021 Document Reviewed: 12/25/2020 Elsevier Patient Education  [place].

## 2022-07-13 NOTE — Progress Notes (Signed)
Subjective:   Brittney Ruiz is a 57 y.o. female who presents for Medicare Annual (Subsequent) preventive examination.  Review of Systems    No ROS.  Medicare Wellness Virtual Visit.  Visual/audio telehealth visit, UTA vital signs.   See social history for additional risk factors.   Cardiac Risk Factors include: advanced age (>4mn, >>28women);hypertension     Objective:    Today's Vitals   07/13/22 1432  Weight: 201 lb (91.2 kg)  Height: '5\' 2"'$  (1.575 m)   Body mass index is 36.76 kg/m.     07/13/2022    2:32 PM 07/10/2021   11:10 AM 07/10/2020    3:49 PM 07/09/2020   12:09 PM 07/07/2019   11:12 AM 05/01/2019    2:50 PM 06/28/2018   11:01 AM  Advanced Directives  Does Patient Have a Medical Advance Directive? No No No No No No No  Does patient want to make changes to medical advance directive?    No - Patient declined     Would patient like information on creating a medical advance directive? No - Patient declined No - Patient declined   No - Patient declined  No - Patient declined    Current Medications (verified) Outpatient Encounter Medications as of 07/13/2022  Medication Sig   albuterol (VENTOLIN HFA) 108 (90 Base) MCG/ACT inhaler USE 2 INHALATIONS ORALLY   EVERY 6 HOURS AS NEEDED.   Alcohol Swabs (B-D SINGLE USE SWABS REGULAR) PADS USE 1 SWAB TOPICALLY TO    CLEAN FINGER BEFORE        CHECKING GLUCOSE   Ascorbic Acid (VITAMIN C) 1000 MG tablet Take 1,000 mg by mouth daily.   aspirin EC 81 MG tablet Take 81 mg by mouth at bedtime.   B Complex Vitamins (BL VITAMIN B COMPLEX PO) +Folic acid qd.   Blood Glucose Monitoring Suppl (TRUE METRIX METER) w/Device KIT USED TO CHECK GLUCOSE READINGS  ONE TIME DAILY AS NEEDED.   Calcium Carb-Cholecalciferol (CALCIUM 600/VITAMIN D PO) Take 1 tablet by mouth daily.   Cholecalciferol (VITAMIN D-3) 25 MCG (1000 UT) CAPS Take 1 capsule by mouth daily.   clorazepate (TRANXENE) 7.5 MG tablet Take 7.5 mg by mouth 3 (three) times daily.    fexofenadine (ALLEGRA) 60 MG tablet Take 1 tablet (60 mg total) by mouth daily. (Patient taking differently: Take 180 mg by mouth daily.)   fluticasone (FLONASE) 50 MCG/ACT nasal spray USE 1 SPRAY IN EACH NOSTRILAT BEDTIME   glucose blood (TRUE METRIX BLOOD GLUCOSE TEST) test strip TEST BLOOD SUGAR ONE TIME DAILY   hydrocortisone 2.5 % cream Apply topically 2 (two) times daily as needed (Rash). Apply twice daily for up to 2 weeks at a time as needed for rash.   Multiple Vitamin (MULTIVITAMIN) tablet Take 1 tablet by mouth daily.   mupirocin ointment (BACTROBAN) 2 % Apply 1 Application topically daily.   Omega-3 Fatty Acids (FISH OIL) 1000 MG CAPS Take 2 capsules by mouth daily.   omeprazole (PRILOSEC) 20 MG capsule TAKE 1 CAPSULE DAILY   polyethylene glycol powder (GLYCOLAX/MIRALAX) 17 GM/SCOOP powder Take 1 Container by mouth at bedtime.   propranolol (INDERAL) 20 MG tablet Take 1 tablet (20 mg total) by mouth 3 (three) times daily.   psyllium (METAMUCIL) 58.6 % powder Take 1 packet by mouth daily.   sertraline (ZOLOFT) 100 MG tablet TAKE 1+1/2 TABLETS BY MOUTH DAILY AS DIRECTED (Patient taking differently: Take 112.5 mg by mouth See admin instructions. 112.5 in the morning and  25 mg in the evening)   sertraline (ZOLOFT) 25 MG tablet Take 25 mg by mouth every evening.    TRUEplus Lancets 33G MISC TEST BLOOD SUGAR ONE TIME DAILY   Wheat Dextrin (BENEFIBER DRINK MIX PO) Take by mouth.   No facility-administered encounter medications on file as of 07/13/2022.    Allergies (verified) Elemental sulfur, Pork allergy, Pork-derived products, Aciphex [rabeprazole sodium], Antivert [meclizine hcl], Buspar [buspirone hcl], Cat hair extract, Chlorpheniramine-pseudoeph, Chlorpheniramine-pseudoeph, Chocolate flavor, Codeine, Crestor [rosuvastatin calcium], Cymbalta [duloxetine hcl], Duloxetine, Effexor xr [venlafaxine hydrochloride], Epinephrine, Erythromycin, Escitalopram oxalate, Lamictal [lamotrigine],  Levofloxacin, Lipitor [atorvastatin], Meclizine, Mometasone furoate, Mometasone furoate, Monosodium glutamate, Neurontin [gabapentin], Other, Prednisone, Red yeast rice extract, Shellfish allergy, Sudafed [pseudoephedrine hcl], Sulfa antibiotics, Sulfasalazine, Valium, Wellbutrin [bupropion hcl], Wixela inhub [fluticasone-salmeterol], Xanax xr [alprazolam], Zetia [ezetimibe], Alfalfa, Brompheniramine-phenylephrine, Buspirone, and Tape   History: Past Medical History:  Diagnosis Date   Abnormal Pap smear of cervix    h/o LEEP   Allergy    Angio-edema    Anxiety    Asthma    Bladder leak    Cancer ([place])    melanoma 2003   Chest pain    Constipation    COPD (chronic obstructive pulmonary disease) (HCC)    Depression    Diarrhea    Food allergy    Heart valve problem    Heartburn    History of swelling of feet    Hyperlipidemia    Hypertension    IBS (irritable bowel syndrome)    Joint pain    Low back pain    Melanoma ([place])    right foot s/p lymph node removed right groin   Obesity (BMI 30-39.9)    Palpitation    Prediabetes    Shortness of breath    Urticaria    Vaginal delivery    X 2   Past Surgical History:  Procedure Laterality Date   BLADDER SUSPENSION  07/2017   COLONOSCOPY WITH PROPOFOL N/A 07/01/2017   Procedure: COLONOSCOPY WITH PROPOFOL;  Surgeon: Virgel Manifold, MD;  Location: [facility];  Service: Endoscopy;  Laterality: N/A;   ESOPHAGOGASTRODUODENOSCOPY (EGD) WITH PROPOFOL N/A 07/01/2017   Procedure: ESOPHAGOGASTRODUODENOSCOPY (EGD) WITH PROPOFOL;  Surgeon: Virgel Manifold, MD;  Location: [facility];  Service: Endoscopy;  Laterality: N/A;   LEEP  1995   LEEP     PARTIAL HYSTERECTOMY  08/2017   s/p partial hysterectomy - per patient has fallopian tubes; ovaries INTACT. NO CERVIX on exam 03/06/20   SKIN CANCER EXCISION     melanoma   TONSILLECTOMY     Family History  Problem Relation Age of Onset   Hyperlipidemia Mother     Hypertension Mother    Diabetes Mother    Breast cancer Mother 22   Stroke Mother    Cancer Mother    Cancer Father        lung   Depression Father    Diabetes Father    Hypertension Father    Hyperlipidemia Father    Sleep apnea Father    Alcoholism Father    Hyperlipidemia Sister    Hypertension Sister    Migraines Sister    Hypertension Brother    Hyperlipidemia Brother    Migraines Brother    Cancer Maternal Grandmother        glioblastoma   Depression Maternal Grandfather    Heart disease Maternal Grandfather    Cancer Paternal Grandfather        unknown   AAA (abdominal aortic  aneurysm) Paternal Grandfather    Social History   Socioeconomic History   Marital status: Married    Spouse name: Not on file   Number of children: Not on file   Years of education: Not on file   Highest education level: Not on file  Occupational History   Occupation: disability  Tobacco Use   Smoking status: Former    Packs/day: 1.00    Years: 25.00    Total pack years: 25.00    Types: Cigarettes    Quit date: 2008    Years since quitting: 16.1   Smokeless tobacco: Never  Vaping Use   Vaping Use: Never used  Substance and Sexual Activity   Alcohol use: No   Drug use: No   Sexual activity: Yes  Other Topics Concern   Not on file  Social History Narrative   Married 25 years.        2 children by 1st husband.       Disabled. Not working.    Right handed      One story home      Social Determinants of Health   Financial Resource Strain: Low Risk  (07/10/2021)   Overall Financial Resource Strain (CARDIA)    Difficulty of Paying Living Expenses: Not hard at all  Food Insecurity: No Food Insecurity (07/12/2022)   Hunger Vital Sign    Worried About Running Out of Food in the Last Year: Never true    Ran Out of Food in the Last Year: Never true  Transportation Needs: No Transportation Needs (07/12/2022)   PRAPARE - Hydrologist (Medical): No     Lack of Transportation (Non-Medical): No  Physical Activity: Inactive (07/12/2022)   Exercise Vital Sign    Days of Exercise per Week: 0 days    Minutes of Exercise per Session: 0 min  Stress: Stress Concern Present (07/12/2022)   [place]    Feeling of Stress : Very much  Social Connections: Unknown (07/12/2022)   Social Connection and Isolation Panel [NHANES]    Frequency of Communication with Friends and Family: Three times a week    Frequency of Social Gatherings with Friends and Family: Once a week    Attends Religious Services: Not on Advertising copywriter or Organizations: Yes    Attends Archivist Meetings: 1 to 4 times per year    Marital Status: Married    Tobacco Counseling Counseling given: Not Answered   Clinical Intake:  Pre-visit preparation completed: Yes        Diabetes:  (Followed by pcp)  How often do you need to have someone help you when you read instructions, pamphlets, or other written materials from your doctor or pharmacy?: 3 - Sometimes    Interpreter Needed?: No      Activities of Daily Living    07/12/2022    6:05 PM  In your present state of health, do you have any difficulty performing the following activities:  Hearing? 0  Vision? 1  Difficulty concentrating or making decisions? 1  Walking or climbing stairs? 0  Dressing or bathing? 0  Doing errands, shopping? 1  Comment [place] and eating ? N  Using the Toilet? N  In the past six months, have you accidently leaked urine? Y  Comment Managed with daily pad  Do you have problems with loss of bowel control? N  Managing your Medications? N  Managing your Finances? N  Housekeeping or managing your Housekeeping? Y  Comment Family assist as needed    Patient Care Team: Burnard Hawthorne, FNP as PCP - General (Family Medicine) Alda Berthold, DO as Consulting Physician  (Neurology)  Indicate any recent Medical Services you may have received from other than [facility] providers in the past year (date may be approximate).     Assessment:   This is a routine wellness examination for Meliza.  I connected with  MONETA MCCULLEY on 07/13/22 by a audio enabled telemedicine application and verified that I am speaking with the correct person using two identifiers.  Patient Location: Home  Provider Location: Office/Clinic  I discussed the limitations of evaluation and management by telemedicine. The patient expressed understanding and agreed to proceed.   Patient Medicare AWV questionnaire was completed by the patient on 07/12/22 , I have confirmed that all information answered by patient is correct and no changes since this date.   Hearing/Vision screen Hearing Screening - Comments:: Patient is able to hear conversational tones without difficulty. No issues reported. Vision Screening - Comments:: Follow[facility] and [facility] Wears corrective lenses  They have seen their ophthalmologist in the last 12 months.    Dietary issues and exercise activities discussed:   [place] program She tries to have a regular diet Good water intake   Goals Addressed             This Visit's Progress    Healthy Lifestyle       Stay active Healthy diet; monitor sugar intake Join weight watchers       Depression Screen    07/13/2022    2:23 PM 06/01/2022    3:55 PM 03/27/2022   11:57 AM 03/04/2022    1:38 PM 08/12/2021    9:49 AM 07/10/2021   11:09 AM 03/03/2021    2:49 PM  PHQ 2/9 Scores  PHQ - 2 Score '2 2 4 1 '$ 0 0 2  PHQ- 9 Score '5 5 8 1 '$ 0  9    Fall Risk    07/12/2022    6:05 PM 06/01/2022    3:55 PM 03/27/2022   11:56 AM 03/04/2022    1:38 PM 08/12/2021    9:48 AM  Fall Risk   Falls in the past year? 0 0 0 0 0  Number falls in past yr: 0 0 0 0 0  Injury with Fall? 0 0 0 0 0  Risk for fall due to :   No Fall Risks No Fall Risks  No Fall Risks  Follow up  Falls evaluation completed Falls evaluation completed Falls evaluation completed Falls evaluation completed    [place]: Home free of loose throw rugs in walkways, pet beds, electrical cords, etc? Yes  Adequate lighting in your home to reduce risk of falls? Yes   ASSISTIVE DEVICES UTILIZED TO PREVENT FALLS: Life alert? No  Use of a cane, walker or w/c? No  Grab bars in the bathroom? No  Shower chair or bench in shower? No  Elevated toilet seat or a handicapped toilet? No   TIMED UP AND GO: Was the test performed? No .   Cognitive Function:    06/25/2017    1:38 PM  MMSE - Mini Mental State Exam  Orientation to time 5  Orientation to Place 5  Registration 3  Attention/ Calculation 5  Recall  3  Language- name 2 objects 2  Language- repeat 1  Language- follow 3 step command 3  Language- read & follow direction 1  Write a sentence 1  Copy design 1  Total score 30        07/13/2022    2:29 PM 07/09/2020   12:10 PM 07/07/2019   11:27 AM 06/28/2018   11:21 AM 06/24/2016    3:25 PM  6CIT Screen  What Year? 0 points 0 points 0 points 0 points 0 points  What month? 0 points 0 points 0 points 0 points 0 points  What time? 0 points 0 points 0 points 0 points 0 points  Count back from 20 0 points  0 points 0 points 0 points  Months in reverse 0 points 0 points 0 points 0 points 0 points  Repeat phrase 0 points  0 points 0 points 0 points  Total Score 0 points  0 points 0 points 0 points    Immunizations Immunization History  Administered Date(s) Administered   Hep A / Hep B 07/21/2021, 09/01/2021   Influenza Inj Mdck Quad Pf 03/05/2017, 03/22/2019   Influenza Split 04/17/2013   Influenza Whole 05/18/2008, 05/29/2011   Influenza,inj,Quad PF,6+ Mos 02/21/2016, 03/03/2021, 03/04/2022   Influenza-Unspecified 02/16/2016, 03/05/2017, 05/06/2020   PFIZER(Purple Top)SARS-COV-2 Vaccination 08/14/2019, 09/06/2019, 03/27/2020    Pfizer Covid-19 Vaccine Bivalent Booster 21yr & up 05/27/2021   Pneumococcal Polysaccharide-23 05/02/2018   Td 05/18/1997   Tdap 07/16/2009, 04/17/2013, 08/12/2021    Shingrix Completed?: No.    Education has been provided regarding the importance of this vaccine. Patient has been advised to call insurance company to determine out of pocket expense if they have not yet received this vaccine. Advised may also receive vaccine at local pharmacy or Health Dept. Verbalized acceptance and understanding.  Screening Tests Health Maintenance  Topic Date Due   COLONOSCOPY (Pts 45-464yrInsurance coverage will need to be confirmed)  07/01/2022   Lung Cancer Screening  07/29/2022   COVID-19 Vaccine (5 - 2023-24 season) 07/29/2022 (Originally 01/16/2022)   Zoster Vaccines- Shingrix (1 of 2) 10/11/2022 (Originally [birth year]1986)   PAP SMEAR-Modifier  03/07/2023   Medicare Annual Wellness (AWV)  07/14/2023   MAMMOGRAM  05/08/2024   DTaP/Tdap/Td (5 - Td or Tdap) 08/13/2031   INFLUENZA VACCINE  Completed   Hepatitis C Screening  Completed   HIV Screening  Completed   HPV VACCINES  Aged Out    Health Maintenance Health Maintenance Due  Topic Date Due   COLONOSCOPY (Pts [phone] coverage will need to be confirmed)  07/01/2022   Lung Cancer Screening  07/29/2022   Lung Cancer Screening: scheduled 07/29/22.  Hepatitis C Screening: Completed 04/2021.  Colonoscopy- ordered per consent.   Vision Screening: Recommended annual ophthalmology exams for early detection of glaucoma and other disorders of the eye.  Dental Screening: Recommended annual dental exams for proper oral hygiene  Community Resource Referral / Chronic Care Management: CRR required this visit?  No   CCM required this visit?  No      Plan:     I have personally reviewed and noted the following in the patient's chart:   Medical and social history Use of alcohol, tobacco or illicit drugs  Current medications and  supplements including opioid prescriptions. Patient is not currently taking opioid prescriptions. Functional ability and status Nutritional status Physical activity Advanced directives List of other physicians Hospitalizations, surgeries, and ER visits in previous 12 months Vitals Screenings to include cognitive, depression, and  falls Referrals and appointments  In addition, I have reviewed and discussed with patient certain preventive protocols, quality metrics, and best practice recommendations. A written personalized care plan for preventive services as well as general preventive health recommendations were provided to patient.     Leta Jungling, LPN   [phone]

## 2022-07-20 ENCOUNTER — Telehealth: Payer: Self-pay | Admitting: *Deleted

## 2022-07-20 ENCOUNTER — Other Ambulatory Visit: Payer: Self-pay | Admitting: *Deleted

## 2022-07-20 DIAGNOSIS — Z860100 Personal history of colon polyps, unspecified: Secondary | ICD-10-CM

## 2022-07-20 DIAGNOSIS — Z1211 Encounter for screening for malignant neoplasm of colon: Secondary | ICD-10-CM

## 2022-07-20 MED ORDER — NA SULFATE-K SULFATE-MG SULF 17.5-3.13-1.6 GM/177ML PO SOLN: 1.0000 | Freq: Once | ORAL | 0 refills | Status: AC

## 2022-07-20 NOTE — Telephone Encounter (Signed)
Gastroenterology Pre-Procedure Review  Request Date: 08/05/2022 Requesting Physician: Dr. Vicente Males  PATIENT REVIEW QUESTIONS: The patient responded to the following health history questions as indicated:    1. Are you having any GI issues? no 2. Do you have a personal history of Polyps? yes (07/06/2017) 3. Do you have a family history of Colon Cancer or Polyps? no 4. Diabetes Mellitus? no 5. Joint replacements in the past 12 months?no 6. Major health problems in the past 3 months?no 7. Any artificial heart valves, MVP, or defibrillator?no    MEDICATIONS & ALLERGIES:    Patient reports the following regarding taking any anticoagulation/antiplatelet therapy:   Plavix, Coumadin, Eliquis, Xarelto, Lovenox, Pradaxa, Brilinta, or Effient? no Aspirin? yes (81 mg)  Patient confirms/reports the following medications:  Current Outpatient Medications  Medication Sig Dispense Refill   Na Sulfate-K Sulfate-Mg Sulf 17.5-3.13-1.6 GM/177ML SOLN Take 1 kit by mouth once for 1 dose. 354 mL 0   albuterol (VENTOLIN HFA) 108 (90 Base) MCG/ACT inhaler USE 2 INHALATIONS ORALLY   EVERY 6 HOURS AS NEEDED. 18 g 1   Alcohol Swabs (B-D SINGLE USE SWABS REGULAR) PADS USE 1 SWAB TOPICALLY TO    CLEAN FINGER BEFORE        CHECKING GLUCOSE 300 each 1   Ascorbic Acid (VITAMIN C) 1000 MG tablet Take 1,000 mg by mouth daily.     aspirin EC 81 MG tablet Take 81 mg by mouth at bedtime.     B Complex Vitamins (BL VITAMIN B COMPLEX PO) +Folic acid qd.     Blood Glucose Monitoring Suppl (TRUE METRIX METER) w/Device KIT USED TO CHECK GLUCOSE READINGS  ONE TIME DAILY AS NEEDED. 1 kit 0   Calcium Carb-Cholecalciferol (CALCIUM 600/VITAMIN D PO) Take 1 tablet by mouth daily.     Cholecalciferol (VITAMIN D-3) 25 MCG (1000 UT) CAPS Take 1 capsule by mouth daily.     clorazepate (TRANXENE) 7.5 MG tablet Take 7.5 mg by mouth 3 (three) times daily.     fexofenadine (ALLEGRA) 60 MG tablet Take 1 tablet (60 mg total) by mouth daily.  (Patient taking differently: Take 180 mg by mouth daily.) 90 tablet 1   fluticasone (FLONASE) 50 MCG/ACT nasal spray USE 1 SPRAY IN EACH NOSTRILAT BEDTIME 16 g 2   glucose blood (TRUE METRIX BLOOD GLUCOSE TEST) test strip TEST BLOOD SUGAR ONE TIME DAILY 100 strip 0   hydrocortisone 2.5 % cream Apply topically 2 (two) times daily as needed (Rash). Apply twice daily for up to 2 weeks at a time as needed for rash. 30 g 1   Multiple Vitamin (MULTIVITAMIN) tablet Take 1 tablet by mouth daily.     mupirocin ointment (BACTROBAN) 2 % Apply 1 Application topically daily. 22 g 0   Omega-3 Fatty Acids (FISH OIL) 1000 MG CAPS Take 2 capsules by mouth daily.     omeprazole (PRILOSEC) 20 MG capsule TAKE 1 CAPSULE DAILY 90 capsule 2   polyethylene glycol powder (GLYCOLAX/MIRALAX) 17 GM/SCOOP powder Take 1 Container by mouth at bedtime.     propranolol (INDERAL) 20 MG tablet Take 1 tablet (20 mg total) by mouth 3 (three) times daily. 90 tablet 3   psyllium (METAMUCIL) 58.6 % powder Take 1 packet by mouth daily.     sertraline (ZOLOFT) 100 MG tablet TAKE 1+1/2 TABLETS BY MOUTH DAILY AS DIRECTED (Patient taking differently: Take 112.5 mg by mouth See admin instructions. 112.5 in the morning and 25 mg in the evening) 45 tablet 3   sertraline (  ZOLOFT) 25 MG tablet Take 25 mg by mouth every evening.      TRUEplus Lancets 33G MISC TEST BLOOD SUGAR ONE TIME DAILY 100 each 0   Wheat Dextrin (BENEFIBER DRINK MIX PO) Take by mouth.     No current facility-administered medications for this visit.    Patient confirms/reports the following allergies:  Allergies  Allergen Reactions   Elemental Sulfur Swelling, Hives and Other (See Comments)   Pork Allergy Swelling    Other reaction(s): OTHER   Pork-Derived Products Swelling    Other reaction(s): OTHER Other reaction(s): OTHER Other reaction(s): OTHER    Aciphex [Rabeprazole Sodium]    Antivert [Meclizine Hcl]     Causes dizziness and hives   Buspar [Buspirone  Hcl]     hives   Cat Hair Extract     Other reaction(s): SHORTNESS OF BREATH   Chlorpheniramine-Pseudoeph    Chlorpheniramine-Pseudoeph Other (See Comments)    Messes with her anxiety   Chocolate Flavor    Codeine    Crestor [Rosuvastatin Calcium]     Could not tolerate    Cymbalta [Duloxetine Hcl]    Duloxetine Other (See Comments)    Other reaction(s): HIVES Other reaction(s): HIVES    Effexor Xr [Venlafaxine Hydrochloride]     hives   Epinephrine    Erythromycin     Throwing up   Escitalopram Oxalate    Lamictal [Lamotrigine]     hives   Levofloxacin Swelling    Levofloxacin   Lipitor [Atorvastatin]     Multiple symptoms   Meclizine Other (See Comments)   Mometasone Furoate    Mometasone Furoate    Monosodium Glutamate     Other reaction(s): OTHER Other reaction(s): OTHER    Neurontin [Gabapentin]     Hives    Other     Other reaction(s): HIVES Other reaction(s): HIVES Other reaction(s): HIVES Other reaction(s): HIVES    Prednisone    Red Yeast Rice Extract     aches   Shellfish Allergy     Other reaction(s): HIVES Other reaction(s): HIVES   Sudafed [Pseudoephedrine Hcl]     Causes severe anxiety   Sulfa Antibiotics Other (See Comments)    Other reaction(s): HIVES Other reaction(s): HIVES    Sulfasalazine Other (See Comments)   Valium    Wellbutrin [Bupropion Hcl]    Wixela Inhub [Fluticasone-Salmeterol]     Increased heart rate   Xanax Xr [Alprazolam]     Hives and anxiety   Zetia [Ezetimibe]     body aches on full dose   Alfalfa Rash   Brompheniramine-Phenylephrine Palpitations    hypersensitive     Buspirone Rash and Other (See Comments)   Tape Rash    No orders of the defined types were placed in this encounter.   AUTHORIZATION INFORMATION Primary Insurance: 1D#: Group #:  Secondary Insurance: 1D#: Group #:  SCHEDULE INFORMATION: Date: 08/05/2022 Time: Location: [place]

## 2022-07-29 ENCOUNTER — Ambulatory Visit
Admission: RE | Admit: 2022-07-29 | Discharge: 2022-07-29 | Disposition: A | Discharge status: Home / Self Care | Payer: Medicare HMO | Source: Ambulatory Visit | Attending: Family | Admitting: Family

## 2022-07-29 DIAGNOSIS — Z87891 Personal history of nicotine dependence: Secondary | ICD-10-CM | POA: Diagnosis not present

## 2022-07-30 ENCOUNTER — Other Ambulatory Visit: Payer: Self-pay | Admitting: Family

## 2022-07-30 ENCOUNTER — Telehealth: Payer: Self-pay | Admitting: Acute Care

## 2022-07-30 ENCOUNTER — Encounter (INDEPENDENT_AMBULATORY_CARE_PROVIDER_SITE_OTHER): Payer: Self-pay | Admitting: Family Medicine

## 2022-07-30 ENCOUNTER — Ambulatory Visit (INDEPENDENT_AMBULATORY_CARE_PROVIDER_SITE_OTHER): Payer: Medicare HMO | Admitting: Family Medicine

## 2022-07-30 VITALS — BP 116/80 | HR 63 | Temp 98.4°F | Ht 62.0 in | Wt 198.0 lb

## 2022-07-30 DIAGNOSIS — E7849 Other hyperlipidemia: Secondary | ICD-10-CM | POA: Diagnosis not present

## 2022-07-30 DIAGNOSIS — F32A Depression, unspecified: Secondary | ICD-10-CM

## 2022-07-30 DIAGNOSIS — F419 Anxiety disorder, unspecified: Secondary | ICD-10-CM

## 2022-07-30 DIAGNOSIS — R7303 Prediabetes: Secondary | ICD-10-CM

## 2022-07-30 DIAGNOSIS — R69 Illness, unspecified: Secondary | ICD-10-CM | POA: Diagnosis not present

## 2022-07-30 DIAGNOSIS — E559 Vitamin D deficiency, unspecified: Secondary | ICD-10-CM

## 2022-07-30 DIAGNOSIS — Z6836 Body mass index (BMI) 36.0-36.9, adult: Secondary | ICD-10-CM

## 2022-07-30 DIAGNOSIS — R5383 Other fatigue: Secondary | ICD-10-CM | POA: Diagnosis not present

## 2022-07-30 NOTE — Telephone Encounter (Signed)
Called received from [facility] for a call report for patient's lung cancer screening CT. Below is a copy of the impression:     IMPRESSION: 1. Lung-RADS 4A, suspicious. Follow up low-dose chest CT without contrast in 3 months (please use the following order, "CT CHEST LCS NODULE FOLLOW-UP W/O CM") is recommended. Alternatively, PET may be considered when there is a solid component 68m or larger. New right lower lobe pulmonary nodule of 6.1 mm. Adjacent interstitial thickening suggests this may be infectious or inflammatory. Consider antibiotic therapy prior to CT follow-up. 2.  Aortic Atherosclerosis (ICD10-I70.0). 3. Incompletely imaged but mildly prominent spleen, similar back to 2021 and favored to be within normal variation.   These results will be called to the ordering clinician or representative by the Radiologist Assistant, and communication documented in the PACS or [employer]  Will route to SJudson Rochand the lung cancer screen pool for follow up.

## 2022-07-30 NOTE — Assessment & Plan Note (Signed)
Reviewed bioimpedance results and overall progress in over 3 years of medically supervised weight management.  Her weight loss is slower than expected.  She has several items that need to be addressed for further weight reduction including wearing CPAP nightly with nocturnal oxygen, improving dietary logging ensuring adequate caloric intake and protein intake and increasing walking time.  Recommend tracking of daily steps or more of a tangible goal each day.

## 2022-07-30 NOTE — Telephone Encounter (Signed)
Call report. [facility].

## 2022-07-30 NOTE — Progress Notes (Signed)
Office: [phone]  /  Fax: [place]  Starting Date: 06/01/19  Starting Weight: 217lb   Weight Lost Since Last Visit: 3lb   Vitals Temp: 98.4 F (36.9 C) BP: 116/80 Pulse Rate: 63 SpO2: 97 %   Body Composition  Body Fat %: 46.1 % Fat Mass (lbs): 91.4 lbs Muscle Mass (lbs): 101.4 lbs Total Body Water (lbs): 73 lbs Visceral Fat Rating : 13     HPI  Chief Complaint: OBESITY  Brittney Ruiz is here to discuss her progress with her obesity treatment plan. She is on the keeping a food journal and adhering to recommended goals of 1600 calories and 100 protein and states she is following her eating plan approximately 80 % of the time. She states she is exercising 5 minutes 4 times per week.  Interval History:  Since last office visit she Is down 3 lb This gives her a net weight loss of 19 lb in > 3 yrs of medically supervised weight management (her RMR recheck 10/29/21 was 1699 cal) This is an 8.7% total body weight loss. She has been logging her intake most days of the week She did try out Weight Watchers but felt like she was getting in < 1200 cal/ day She got a puppy and is walking more She reports high stress levels Veggies have been constipating her -- she is scheduled for a colonoscopy 08/05/22 with Dr Vicente Males.  She has been eating more milk chocolate.  Pharmacotherapy: none  PHYSICAL EXAM:  Blood pressure 116/80, pulse 63, temperature 98.4 F (36.9 C), height '5\' 2"'$  (1.575 m), weight 198 lb (89.8 kg), SpO2 97 %. Body mass index is 36.21 kg/m.  General: She is overweight, cooperative, alert, well developed, and in no acute distress. PSYCH: Has normal mood, affect and thought process.   Lungs: Normal breathing effort, no conversational dyspnea.   ASSESSMENT AND PLAN  TREATMENT PLAN FOR OBESITY:  Recommended Dietary Goals  Brittney Ruiz is currently in the action stage of change. As such, her goal is to continue weight management plan. She  has agreed to keeping a food journal and adhering to recommended goals of 1600 calories and 110 g of protein.  Behavioral Intervention  We discussed the following Behavioral Modification Strategies today: increasing lean protein intake, increasing vegetables, increasing water intake, work on meal planning and easy cooking plans, work on tracking and journaling calories using tracking App, reading food labels , work on managing stress, creating time for self-care and relaxation measures, and avoiding temptations and identifying enticing environmental cues.  Additional resources provided today: NA  Recommended Physical Activity Goals  Brittney Ruiz has been advised to work up to 150 minutes of moderate intensity aerobic activity a week and strengthening exercises 2-3 times per week for cardiovascular health, weight loss maintenance and preservation of muscle mass.   She has agreed to Work on scheduling and tracking physical activity.   Pharmacotherapy changes for the treatment of obesity:   ASSOCIATED CONDITIONS ADDRESSED TODAY  Anxiety and depression Assessment & Plan: Referral to Dr Mallie Mussel for CBT made due to feeling negative feelings about food     Fatigue, unspecified type -     TSH Rfx on Abnormal to Free T4 -     FSH/LH -     Prealbumin -     Insulin, random  Vitamin D deficiency -     VITAMIN D 25 Hydroxy (Vit-D Deficiency, Fractures)  Other hyperlipidemia -     Lipid panel  Prediabetes -  Comprehensive metabolic panel -     Hemoglobin A1c  Morbid obesity (HCC) Assessment & Plan: Reviewed bioimpedance results and overall progress in over 3 years of medically supervised weight management.  Her weight loss is slower than expected.  She has several items that need to be addressed for further weight reduction including wearing CPAP nightly with nocturnal oxygen, improving dietary logging ensuring adequate caloric intake and protein intake and increasing walking time.   Recommend tracking of daily steps or more of a tangible goal each day.    BMI 36.0-36.9,adult      She was informed of the importance of frequent follow up visits to maximize her success with intensive lifestyle modifications for her multiple health conditions.   ATTESTASTION STATEMENTS:  Reviewed by clinician on day of visit: allergies, medications, problem list, medical history, surgical history, family history, social history, and previous encounter notes pertinent to obesity diagnosis.   I have personally spent 30 minutes total time today in preparation, patient care, nutritional counseling and documentation for this visit, including the following: review of clinical lab tests; review of medical tests/procedures/services.      Dell Ponto, DO DABFM, DABOM [facility] [address] [place], [place] [address] [phone]

## 2022-07-30 NOTE — Assessment & Plan Note (Signed)
Referral to Dr Mallie Mussel for CBT made due to feeling negative feelings about food

## 2022-07-31 ENCOUNTER — Telehealth: Payer: Self-pay | Admitting: Acute Care

## 2022-07-31 DIAGNOSIS — Z87891 Personal history of nicotine dependence: Secondary | ICD-10-CM

## 2022-07-31 DIAGNOSIS — R911 Solitary pulmonary nodule: Secondary | ICD-10-CM

## 2022-07-31 LAB — COMPREHENSIVE METABOLIC PANEL
ALT: 39 IU/L — ABNORMAL HIGH (ref 0–32)
AST: 27 IU/L (ref 0–40)
Albumin/Globulin Ratio: 2.7 — ABNORMAL HIGH (ref 1.2–2.2)
Albumin: 4.8 g/dL (ref 3.8–4.9)
Alkaline Phosphatase: 84 IU/L (ref 44–121)
BUN/Creatinine Ratio: 31 — ABNORMAL HIGH (ref 9–23)
BUN: 21 mg/dL (ref 6–24)
Bilirubin Total: 0.4 mg/dL (ref 0.0–1.2)
CO2: 23 mmol/L (ref 20–29)
Calcium: 9.8 mg/dL (ref 8.7–10.2)
Chloride: 101 mmol/L (ref 96–106)
Creatinine, Ser: 0.67 mg/dL (ref 0.57–1.00)
Globulin, Total: 1.8 g/dL (ref 1.5–4.5)
Glucose: 100 mg/dL — ABNORMAL HIGH (ref 70–99)
Potassium: 4.6 mmol/L (ref 3.5–5.2)
Sodium: 140 mmol/L (ref 134–144)
Total Protein: 6.6 g/dL (ref 6.0–8.5)
eGFR: 103 mL/min/{1.73_m2} (ref >59)

## 2022-07-31 LAB — LIPID PANEL
Chol/HDL Ratio: 7.2 ratio — ABNORMAL HIGH (ref 0.0–4.4)
Cholesterol, Total: 238 mg/dL — ABNORMAL HIGH (ref 100–199)
HDL: 33 mg/dL — ABNORMAL LOW (ref >39)
LDL Chol Calc (NIH): 175 mg/dL — ABNORMAL HIGH (ref 0–99)
Triglycerides: 160 mg/dL — ABNORMAL HIGH (ref 0–149)
VLDL Cholesterol Cal: 30 mg/dL (ref 5–40)

## 2022-07-31 LAB — VITAMIN D 25 HYDROXY (VIT D DEFICIENCY, FRACTURES): Vit D, 25-Hydroxy: 44 ng/mL (ref 30.0–100.0)

## 2022-07-31 LAB — HEMOGLOBIN A1C
Est. average glucose Bld gHb Est-mCnc: 117 mg/dL
Hgb A1c MFr Bld: 5.7 % — ABNORMAL HIGH (ref 4.8–5.6)

## 2022-07-31 LAB — PREALBUMIN: PREALBUMIN: 27 mg/dL (ref 10–36)

## 2022-07-31 LAB — TSH RFX ON ABNORMAL TO FREE T4: TSH: 1.46 u[IU]/mL (ref 0.450–4.500)

## 2022-07-31 LAB — FSH/LH
FSH: 59.7 m[IU]/mL
LH: 32.4 m[IU]/mL

## 2022-07-31 LAB — INSULIN, RANDOM: INSULIN: 7.9 u[IU]/mL (ref 2.6–24.9)

## 2022-07-31 NOTE — Telephone Encounter (Signed)
Have called the patient with the results of her low-dose screening CT.  I explained that her scan was read as a lung RADS 4A, suspicious.  There is a.New right lower lobe pulmonary nodule of 6.1 mm. Adjacent interstitial thickening suggests this may be infectious or inflammatory.   Patient states she has not been sick, she denies fever increased secretions or any sign or symptom of upper respiratory infection.  She has a significant family history of cancer and is very anxious about this finding.  I offered her the 16-month follow-up with reevaluation at that time or a follow-up with Dr. Patsey Berthold in the [facility] office to discuss results and consider Nodify  blood work.  Dr. Patsey Berthold and Lesleigh Noe I have added you to this message, Lesleigh Noe can you please get patient scheduled with Dr. Patsey Berthold to discuss the results of her scan and discuss if blood work may help determine time for follow-up of additional imaging.  Langley Gauss, once patient has been seen by Dr. Patsey Berthold we can determine best option for follow-up imaging.  Dr. Patsey Berthold if you will let us know recommendations for follow-up imaging so that we can follow through appropriately.  Thank you so much

## 2022-08-02 NOTE — Telephone Encounter (Signed)
See telephone note 07/31/2022

## 2022-08-03 NOTE — Telephone Encounter (Signed)
Spoke to patient and scheduled appt with Dr. Patsey Berthold 08/04/2022 at 3:00.

## 2022-08-04 ENCOUNTER — Ambulatory Visit: Payer: Medicare HMO | Admitting: Pulmonary Disease

## 2022-08-04 ENCOUNTER — Encounter: Payer: Self-pay | Admitting: Pulmonary Disease

## 2022-08-04 VITALS — BP 118/74 | HR 68 | Temp 98.0°F | Ht 62.0 in | Wt 204.4 lb

## 2022-08-04 DIAGNOSIS — J45909 Unspecified asthma, uncomplicated: Secondary | ICD-10-CM

## 2022-08-04 DIAGNOSIS — R911 Solitary pulmonary nodule: Secondary | ICD-10-CM | POA: Diagnosis not present

## 2022-08-04 DIAGNOSIS — R0602 Shortness of breath: Secondary | ICD-10-CM | POA: Diagnosis not present

## 2022-08-04 LAB — NITRIC OXIDE: Nitric Oxide: 34

## 2022-08-04 MED ORDER — BUDESONIDE 0.25 MG/2ML IN SUSP: 0.2500 mg | Freq: Two times a day (BID) | RESPIRATORY_TRACT | 2 refills | Status: DC

## 2022-08-04 NOTE — Patient Instructions (Signed)
We are giving you a trial of a nebulizer medication called budesonide this is twice a day use to see if we can control your asthma a little bit better.  You did have low-grade inflammation in your airway today.  We have scheduled breathing tests.  They will set up a follow-up CT in 3 months time through the lung cancer screening program.  We will see you in follow-up in 3 to 4 months time after your CT scan of the chest has been done.

## 2022-08-04 NOTE — Progress Notes (Signed)
Subjective:    Patient ID: Brittney Ruiz, female    DOB: Apr 11, 1966, 57 y.o.   MRN: [id number] Patient Care Team: Burnard Hawthorne, FNP as PCP - General (Family Medicine) Alda Berthold, DO as Consulting Physician (Neurology)  Chief Complaint  Patient presents with   Consult    Nodule. CT on 07/29/2022. No SOB, wheezing or cough.   HPI The patient is a 57 year old former smoker 25-pack-year history of smoking and a history of asthma who presents for evaluation of a lung nodule noted on lung cancer screening CT.  She is referred by Mable Paris, FNP.  She carries a diagnosis of severe persistent asthma with chronic bronchitis previously followed by Dr. Laverle Hobby, last visit with Dr. Ashby Dawes via virtual visit in August 2020.  With regards to her lung nodule she is asymptomatic.  She has not had any cough or increased dyspnea over baseline.  She has occasional wheezing.  For a number of years she has had mild exertional dyspnea which has not changed in character.  Her asthma management has been challenged by her perception of multiple allergies and intolerance to inhaled corticosteroids citing that these make her anxiety worse.  Currently she is not on any controller medication for asthma only albuterol as rescue.  She has been enrolled in the lung cancer screening program 2021 she has had bilateral nodules noted however the nodule in question above is new and will warrant follow-up.  She is not a candidate for Nodify Lung ([employer]) due to prior history of melanoma excised from her right foot and with subsequent right groin node resection.   Dr. Ashby Dawes had listed the following data: **CT cardiac 07/13/2018>>lung views are unremarkable.  **Chest x-ray 06/13/2018>> changes of chronic bronchitis, suggestive of COPD. **CBC 05/02/2018>> absolute eosinophil count 400. **PSG 01/10/18>>negative.  **HST 12/13/17>> negative.  **Allergy prick testing 10/12/2017>> 3+ allergy to cat.   Otherwise all other inhalants, grasses, weeds, trees, molds negative.  Histamine control positive at 2+. **Rast allergy testing 04/10/2013>> mildly positive for cat, dog, otherwise negative for all other tested items.  IgE 19.    Review of Systems A 10 point review of systems was performed and it is as noted above otherwise negative.  Past Medical History:  Diagnosis Date   Abnormal Pap smear of cervix    h/o LEEP   Allergy    Angio-edema    Anxiety    Asthma    Bladder leak    Cancer ([place])    melanoma 2003   Chest pain    Constipation    COPD (chronic obstructive pulmonary disease) (HCC)    Depression    Diarrhea    Food allergy    Heart valve problem    Heartburn    History of swelling of feet    Hyperlipidemia    Hypertension    IBS (irritable bowel syndrome)    Joint pain    Low back pain    Melanoma ([place])    right foot s/p lymph node removed right groin   Obesity (BMI 30-39.9)    Palpitation    Prediabetes    Shortness of breath    Urticaria    Vaginal delivery    X 2   Past Surgical History:  Procedure Laterality Date   BLADDER SUSPENSION  07/2017   COLONOSCOPY WITH PROPOFOL N/A 07/01/2017   Procedure: COLONOSCOPY WITH PROPOFOL;  Surgeon: Virgel Manifold, MD;  Location: [facility] ENDOSCOPY;  Service: Endoscopy;  Laterality: N/A;  ESOPHAGOGASTRODUODENOSCOPY (EGD) WITH PROPOFOL N/A 07/01/2017   Procedure: ESOPHAGOGASTRODUODENOSCOPY (EGD) WITH PROPOFOL;  Surgeon: Virgel Manifold, MD;  Location: [facility] ENDOSCOPY;  Service: Endoscopy;  Laterality: N/A;   LEEP  1995   LEEP     PARTIAL HYSTERECTOMY  08/2017   s/p partial hysterectomy - per patient has fallopian tubes; ovaries INTACT. NO CERVIX on exam 03/06/20   SKIN CANCER EXCISION     melanoma   TONSILLECTOMY     Patient Active Problem List   Diagnosis Date Noted   Plantar fasciitis, bilateral 07/06/2022   OSA (obstructive sleep apnea) 05/19/2022   COVID-19 03/19/2022   Anxiety and depression  03/09/2022   Other hyperlipidemia 03/09/2022   Myalgia due to statin 03/04/2022   Insulin resistance 02/02/2022   Prediabetes 09/22/2021   Left low back pain 08/12/2021   Wound of upper extremity 08/12/2021   Ear pain, right 04/16/2021   Hepatic steatosis 03/26/2021   Meningioma, cerebral ([place]) 09/18/2020   Meningioma ([place]) 09/17/2020   Acute bacterial sinusitis 04/05/2020   Acute bronchitis with asthma 04/05/2020   Urinary urgency 03/06/2020   HTN (hypertension) 07/03/2019   Morbid obesity ([place]) 06/04/2019   Chronic pain of both knees 06/04/2019   Numbness of left arm and leg 02/10/2019   Asthma 09/16/2018   Left foot pain 05/02/2018   Low back pain 10/13/2017   Prolapse of female genital organs 10/05/2017   Uterovaginal prolapse 08/17/2017   Special screening for malignant neoplasms, colon    Polyp of sigmoid colon    Internal hemorrhoids    Diverticulosis of large intestine without diverticulitis    Gastric polyp    Esophageal thickening    Chronic constipation 08/20/2016   Bronchitis 08/20/2016   Routine physical examination 04/20/2016   Lesion of cervix 04/20/2016   Mitral valve regurgitation 09/17/2015   Pain in the chest 08/09/2015   Breast lesion 08/09/2015   Sorethroat 08/02/2013   Acid reflux 12/20/2012   Allergic rhinitis due to pollen 12/05/2012   Malignant melanoma of foot ([place]) 11/30/2012   Vertigo 09/22/2012   Menorrhagia 08/12/2012   HLD (hyperlipidemia) 08/12/2012   Medicare annual wellness visit, subsequent 04/21/2012   Depression    Anxiety    Family History  Problem Relation Age of Onset   Hyperlipidemia Mother    Hypertension Mother    Diabetes Mother    Breast cancer Mother 46   Stroke Mother    Cancer Mother    Cancer Father        lung   Depression Father    Diabetes Father    Hypertension Father    Hyperlipidemia Father    Sleep apnea Father    Alcoholism Father    Hyperlipidemia Sister    Hypertension Sister    Migraines  Sister    Hypertension Brother    Hyperlipidemia Brother    Migraines Brother    Cancer Maternal Grandmother        glioblastoma   Depression Maternal Grandfather    Heart disease Maternal Grandfather    Cancer Paternal Grandfather        unknown   AAA (abdominal aortic aneurysm) Paternal Grandfather    Social History   Tobacco Use   Smoking status: Former    Packs/day: 1.00    Years: 25.00    Additional pack years: 0.00    Total pack years: 25.00    Types: Cigarettes    Quit date: 2008    Years since quitting: 16.2   Smokeless tobacco:  Never  Substance Use Topics   Alcohol use: No   Allergies  Allergen Reactions   Codeine Other (See Comments) and Hives   Elemental Sulfur Swelling, Hives and Other (See Comments)   Pork Allergy Swelling    Other reaction(s): OTHER   Pork-Derived Products Swelling    Other reaction(s): OTHER Other reaction(s): OTHER Other reaction(s): OTHER    Aciphex [Rabeprazole Sodium]    Antivert [Meclizine Hcl]     Causes dizziness and hives   Buspar [Buspirone Hcl]     hives   Cat Hair Extract     Other reaction(s): SHORTNESS OF BREATH   Chlorpheniramine-Pseudoeph    Chlorpheniramine-Pseudoeph Other (See Comments)    Messes with her anxiety   Chocolate Flavor    Crestor [Rosuvastatin Calcium]     Could not tolerate    Cymbalta [Duloxetine Hcl]    Duloxetine Other (See Comments)    Other reaction(s): HIVES Other reaction(s): HIVES    Effexor Xr [Venlafaxine Hydrochloride]     hives   Epinephrine    Erythromycin     Throwing up   Escitalopram Oxalate    Lamictal [Lamotrigine]     hives   Levofloxacin Swelling    Levofloxacin   Lipitor [Atorvastatin]     Multiple symptoms   Meclizine Other (See Comments)   Mometasone Furoate    Mometasone Furoate    Monosodium Glutamate     Other reaction(s): OTHER Other reaction(s): OTHER    Neurontin [Gabapentin]     Hives    Other     Other reaction(s): HIVES Other reaction(s):  HIVES Other reaction(s): HIVES Other reaction(s): HIVES    Prednisone    Red Yeast Rice Extract     aches   Shellfish Allergy     Other reaction(s): HIVES Other reaction(s): HIVES   Sudafed [Pseudoephedrine Hcl]     Causes severe anxiety   Sulfa Antibiotics Other (See Comments)    Other reaction(s): HIVES Other reaction(s): HIVES    Sulfasalazine Other (See Comments)   Valium    Wellbutrin [Bupropion Hcl]    Wixela Inhub [Fluticasone-Salmeterol]     Increased heart rate   Xanax Xr [Alprazolam]     Hives and anxiety   Zetia [Ezetimibe]     body aches on full dose   Alfalfa Rash   Brompheniramine-Phenylephrine Palpitations    hypersensitive     Buspirone Rash and Other (See Comments)   Tape Rash   Current Meds  Medication Sig   albuterol (VENTOLIN HFA) 108 (90 Base) MCG/ACT inhaler USE 2 INHALATIONS ORALLY   EVERY 6 HOURS AS NEEDED.   Alcohol Swabs (B-D SINGLE USE SWABS REGULAR) PADS USE 1 SWAB TOPICALLY TO    CLEAN FINGER BEFORE        CHECKING GLUCOSE   Ascorbic Acid (VITAMIN C) 1000 MG tablet Take 1,000 mg by mouth daily.   aspirin EC 81 MG tablet Take 81 mg by mouth at bedtime.   B Complex Vitamins (BL VITAMIN B COMPLEX PO) +Folic acid qd.   Blood Glucose Monitoring Suppl (TRUE METRIX METER) w/Device KIT USED TO CHECK GLUCOSE READINGS  ONE TIME DAILY AS NEEDED.   Calcium Carb-Cholecalciferol (CALCIUM 600/VITAMIN D PO) Take 1 tablet by mouth daily.   Cholecalciferol (VITAMIN D-3) 25 MCG (1000 UT) CAPS Take 1 capsule by mouth daily.   clorazepate (TRANXENE) 7.5 MG tablet Take 7.5 mg by mouth 3 (three) times daily.   fexofenadine (ALLEGRA) 60 MG tablet Take 1 tablet (60 mg total) by  mouth daily. (Patient taking differently: Take 180 mg by mouth daily.)   fluticasone (FLONASE) 50 MCG/ACT nasal spray USE 1 SPRAY IN EACH NOSTRILAT BEDTIME   glucose blood (TRUE METRIX BLOOD GLUCOSE TEST) test strip TEST BLOOD SUGAR ONE TIME DAILY   Multiple Vitamin (MULTIVITAMIN) tablet Take  1 tablet by mouth daily.   mupirocin ointment (BACTROBAN) 2 % Apply 1 Application topically daily.   Omega-3 Fatty Acids (FISH OIL) 1000 MG CAPS Take 2 capsules by mouth daily.   omeprazole (PRILOSEC) 20 MG capsule TAKE 1 CAPSULE DAILY   polyethylene glycol powder (GLYCOLAX/MIRALAX) 17 GM/SCOOP powder Take 1 Container by mouth at bedtime.   propranolol (INDERAL) 20 MG tablet Take 1 tablet (20 mg total) by mouth 3 (three) times daily.   psyllium (METAMUCIL) 58.6 % powder Take 1 packet by mouth daily.   sertraline (ZOLOFT) 100 MG tablet TAKE 1+1/2 TABLETS BY MOUTH DAILY AS DIRECTED (Patient taking differently: Take 112.5 mg by mouth See admin instructions. 112.5 in the morning and 25 mg in the evening)   sertraline (ZOLOFT) 25 MG tablet Take 25 mg by mouth every evening.    TRUEplus Lancets 33G MISC TEST BLOOD SUGAR ONE TIME DAILY   Wheat Dextrin (BENEFIBER DRINK MIX PO) Take by mouth.   Immunization History  Administered Date(s) Administered   Hep A / Hep B 07/21/2021, 09/01/2021   Influenza Inj Mdck Quad Pf 03/05/2017, 03/22/2019   Influenza Split 04/17/2013   Influenza Whole 05/18/2008, 05/29/2011   Influenza,inj,Quad PF,6+ Mos 02/21/2016, 03/03/2021, 03/04/2022   Influenza-Unspecified 02/16/2016, 03/05/2017, 05/06/2020   PFIZER(Purple Top)SARS-COV-2 Vaccination 08/14/2019, 09/06/2019, 03/27/2020   Pfizer Covid-19 Vaccine Bivalent Booster 15yrs & up 05/27/2021   Pneumococcal Polysaccharide-23 05/02/2018   Td 05/18/1997   Tdap 07/16/2009, 04/17/2013, 08/12/2021       Objective:   Physical Exam BP 118/74 (BP Location: Left Arm, Cuff Size: Large)   Pulse 68   Temp 98 F (36.7 C)   Ht 5\' 2"  (1.575 m)   Wt 204 lb 6.4 oz (92.7 kg)   LMP  (LMP Unknown)   SpO2 99%   BMI 37.39 kg/m   SpO2: 99 % O2 Device: None (Room air)  GENERAL: This woman, no acute distress, fully ambulatory.  No conversational dyspnea. HEAD: Normocephalic, atraumatic.  EYES: Pupils equal, round, reactive to  light.  No scleral icterus.  MOUTH: Dentition, oral mucosa moist.  No thrush. NECK: Supple. No thyromegaly. Trachea midline. No JVD.  No adenopathy. PULMONARY: Good air entry bilaterally.  No adventitious sounds. CARDIOVASCULAR: S1 and S2. Regular rate and rhythm.  No rubs, murmurs or gallops heard. ABDOMEN: Obese, otherwise benign. MUSCULOSKELETAL: No joint deformity, no clubbing, no edema.  NEUROLOGIC: No overt focal deficit, no gait disturbance, speech is fluent. SKIN: Intact,warm,dry. PSYCH: Mood and behavior normal  Representative image from lung cancer screening CT performed 29 July 2022 showing the 6.1 mm nodule on the right lower lobe:    Lab Results  Component Value Date   NITRICOXIDE 34 08/04/2022      Assessment & Plan:     ICD-10-CM   1. Asthma, unspecified asthma severity, unspecified whether complicated, unspecified whether persistent  J45.909 Pulmonary Function Test [facility] Only   Will add Pulmicort nebulized to her medication regimen Will obtain PFTs    2. SOB (shortness of breath)  R06.02 Nitric oxide    Pulmonary Function Test [facility] Only   Multifactorial: Asthma, obesity/deconditioning PFTs should help clarify    3. Lung nodule  R91.1  Will have lung cancer screening program arrange for follow-up     Orders Placed This Encounter  Procedures   Nitric oxide   Pulmonary Function Test [facility] Only    Standing Status:   Future    Standing Expiration Date:   08/04/2023    Order Specific Question:   Full PFT: includes the following: basic spirometry, spirometry pre & post bronchodilator, diffusion capacity (DLCO), lung volumes    Answer:   Full PFT    Order Specific Question:   This test can only be performed at    Answer:   [employer] ordered this encounter  Medications   budesonide (PULMICORT) 0.25 MG/2ML nebulizer solution    Sig: Take 2 mLs (0.25 mg total) by nebulization 2 (two) times daily.    Dispense:  60 mL    Refill:  2   Will see  the patient in follow-up in 3 to 4 months time she is to contact us prior to that time should any new difficulties arise.  Renold Don, MD Advanced Bronchoscopy PCCM [facility]    *This note was dictated using voice recognition software/Dragon.  Despite best efforts to proofread, errors can occur which can change the meaning. Any transcriptional errors that result from this process are unintentional and may not be fully corrected at the time of dictation.

## 2022-08-04 NOTE — Telephone Encounter (Signed)
Per Dr Patsey Berthold, pt will need a 3 month Chest CT follow up. Order has been placed.

## 2022-08-05 ENCOUNTER — Encounter: Payer: Self-pay | Admitting: Gastroenterology

## 2022-08-05 ENCOUNTER — Encounter
Admission: RE | Disposition: A | Discharge status: Home / Self Care | Payer: Self-pay | Source: Ambulatory Visit | Attending: Gastroenterology

## 2022-08-05 ENCOUNTER — Ambulatory Visit: Payer: Medicare HMO | Admitting: Certified Registered"

## 2022-08-05 ENCOUNTER — Ambulatory Visit
Admission: RE | Admit: 2022-08-05 | Discharge: 2022-08-05 | Disposition: A | Discharge status: Home / Self Care | Payer: Medicare HMO | Source: Ambulatory Visit | Attending: Gastroenterology | Admitting: Gastroenterology

## 2022-08-05 DIAGNOSIS — E785 Hyperlipidemia, unspecified: Secondary | ICD-10-CM | POA: Diagnosis not present

## 2022-08-05 DIAGNOSIS — R12 Heartburn: Secondary | ICD-10-CM | POA: Insufficient documentation

## 2022-08-05 DIAGNOSIS — I10 Essential (primary) hypertension: Secondary | ICD-10-CM | POA: Insufficient documentation

## 2022-08-05 DIAGNOSIS — J449 Chronic obstructive pulmonary disease, unspecified: Secondary | ICD-10-CM | POA: Diagnosis not present

## 2022-08-05 DIAGNOSIS — K219 Gastro-esophageal reflux disease without esophagitis: Secondary | ICD-10-CM | POA: Diagnosis not present

## 2022-08-05 DIAGNOSIS — D124 Benign neoplasm of descending colon: Secondary | ICD-10-CM | POA: Insufficient documentation

## 2022-08-05 DIAGNOSIS — D126 Benign neoplasm of colon, unspecified: Secondary | ICD-10-CM | POA: Diagnosis not present

## 2022-08-05 DIAGNOSIS — F419 Anxiety disorder, unspecified: Secondary | ICD-10-CM | POA: Insufficient documentation

## 2022-08-05 DIAGNOSIS — K635 Polyp of colon: Secondary | ICD-10-CM | POA: Diagnosis not present

## 2022-08-05 DIAGNOSIS — K589 Irritable bowel syndrome without diarrhea: Secondary | ICD-10-CM | POA: Insufficient documentation

## 2022-08-05 DIAGNOSIS — Z860100 Personal history of colon polyps, unspecified: Secondary | ICD-10-CM

## 2022-08-05 DIAGNOSIS — E669 Obesity, unspecified: Secondary | ICD-10-CM | POA: Diagnosis not present

## 2022-08-05 DIAGNOSIS — R69 Illness, unspecified: Secondary | ICD-10-CM | POA: Diagnosis not present

## 2022-08-05 DIAGNOSIS — R7303 Prediabetes: Secondary | ICD-10-CM | POA: Insufficient documentation

## 2022-08-05 DIAGNOSIS — Z6837 Body mass index (BMI) 37.0-37.9, adult: Secondary | ICD-10-CM | POA: Diagnosis not present

## 2022-08-05 DIAGNOSIS — F32A Depression, unspecified: Secondary | ICD-10-CM | POA: Insufficient documentation

## 2022-08-05 DIAGNOSIS — R0602 Shortness of breath: Secondary | ICD-10-CM | POA: Diagnosis not present

## 2022-08-05 DIAGNOSIS — Z1211 Encounter for screening for malignant neoplasm of colon: Secondary | ICD-10-CM

## 2022-08-05 DIAGNOSIS — G4730 Sleep apnea, unspecified: Secondary | ICD-10-CM | POA: Insufficient documentation

## 2022-08-05 HISTORY — PX: COLONOSCOPY WITH PROPOFOL: SHX5780

## 2022-08-05 SURGERY — COLONOSCOPY WITH PROPOFOL
Anesthesia: General

## 2022-08-05 MED ORDER — LIDOCAINE HCL (CARDIAC) PF 100 MG/5ML IV SOSY
PREFILLED_SYRINGE | INTRAVENOUS | Status: DC | PRN
  Administered 2022-08-05: 50 mg via INTRAVENOUS

## 2022-08-05 MED ORDER — PROPOFOL 10 MG/ML IV BOLUS
INTRAVENOUS | Status: DC | PRN
  Administered 2022-08-05: 50 mg via INTRAVENOUS
  Administered 2022-08-05: 150 ug/kg/min via INTRAVENOUS

## 2022-08-05 MED ORDER — SODIUM CHLORIDE 0.9 % IV SOLN: INTRAVENOUS | Status: DC

## 2022-08-05 MED ORDER — DEXMEDETOMIDINE HCL IN NACL 200 MCG/50ML IV SOLN
INTRAVENOUS | Status: DC | PRN
  Administered 2022-08-05: 4 ug via INTRAVENOUS

## 2022-08-05 NOTE — Op Note (Signed)
[facility] Gastroenterology Patient Name: Brittney Ruiz Procedure Date: 08/05/2022 10:09 AM MRN: [id number] Account #: [id number] Date of Birth: Oct 26, 1965 Admit Type: Outpatient Age: 57 Room: [facility] ENDO ROOM 3 Gender: Female Note Status: Finalized Instrument Name: Jasper Riling [id number] Procedure:             Colonoscopy Indications:           Surveillance: Personal history of colonic polyps                         (unknown histology) on last colonoscopy 5 years ago Providers:             Jonathon Bellows MD, MD Referring MD:          Yvetta Coder. Arnett (Referring MD) Medicines:             Monitored Anesthesia Care Complications:         No immediate complications. Procedure:             Pre-Anesthesia Assessment:                        - Prior to the procedure, a History and Physical was                         performed, and patient medications, allergies and                         sensitivities were reviewed. The patient's tolerance                         of previous anesthesia was reviewed.                        - The risks and benefits of the procedure and the                         sedation options and risks were discussed with the                         patient. All questions were answered and informed                         consent was obtained.                        - ASA Grade Assessment: II - A patient with mild                         systemic disease.                        After obtaining informed consent, the colonoscope was                         passed under direct vision. Throughout the procedure,                         the patient's blood pressure, pulse, and oxygen  saturations were monitored continuously. The                         Colonoscope was introduced through the anus and                         advanced to the the cecum, identified by the                         appendiceal orifice. The colonoscopy was performed                          with ease. The patient tolerated the procedure well.                         The quality of the bowel preparation was excellent.                         The ileocecal valve, appendiceal orifice, and rectum                         were photographed. Findings:      A 5 mm polyp was found in the descending colon. The polyp was sessile.       The polyp was removed with a cold snare. Resection and retrieval were       complete.      The exam was otherwise without abnormality on direct and retroflexion       views. Impression:            - One 5 mm polyp in the descending colon, removed with                         a cold snare. Resected and retrieved.                        - The examination was otherwise normal on direct and                         retroflexion views. Recommendation:        - Discharge patient to home (with escort).                        - Resume previous diet.                        - Continue present medications.                        - Await pathology results.                        - Repeat colonoscopy for surveillance based on                         pathology results. Procedure Code(s):     --- Professional ---                        [phone], Colonoscopy, flexible; with removal of  tumor(s), polyp(s), or other lesion(s) by snare                         technique Diagnosis Code(s):     --- Professional ---                        Z86.010, Personal history of colonic polyps                        D12.4, Benign neoplasm of descending colon CPT copyright 2022 American Medical Association. All rights reserved. The codes documented in this report are preliminary and upon coder review may  be revised to meet current compliance requirements. Jonathon Bellows, MD Jonathon Bellows MD, MD 08/05/2022 10:39:42 AM This report has been signed electronically. Number of Addenda: 0 Note Initiated On: 08/05/2022 10:09 AM Scope Withdrawal Time: 0  hours 9 minutes 38 seconds  Total Procedure Duration: 0 hours 12 minutes 55 seconds  Estimated Blood Loss:  Estimated blood loss: none.      [facility]

## 2022-08-05 NOTE — Anesthesia Preprocedure Evaluation (Signed)
Anesthesia Evaluation  Patient identified by MRN, date of birth, ID band Patient awake    Reviewed: Allergy & Precautions, NPO status , Patient's Chart, lab work & pertinent test results  History of Anesthesia Complications Negative for: history of anesthetic complications  Airway Mallampati: II       Dental  (+) Dental Advidsory Given, Poor Dentition   Pulmonary shortness of breath and with exertion, asthma , sleep apnea , neg COPD, neg recent URI, Not current smoker, former smoker          Cardiovascular hypertension, Pt. on medications (-) Past MI and (-) CHF (-) dysrhythmias (-) Valvular Problems/Murmurs     Neuro/Psych neg Seizures PSYCHIATRIC DISORDERS Anxiety Depression       GI/Hepatic Neg liver ROS,GERD  Medicated,,  Endo/Other  neg diabetes    Renal/GU negative Renal ROS     Musculoskeletal   Abdominal   Peds  Hematology   Anesthesia Other Findings Past Medical History: No date: Abnormal Pap smear of cervix     Comment:  h/o LEEP No date: Allergy No date: Angio-edema No date: Anxiety No date: Asthma No date: Bladder leak No date: Cancer [facility])     Comment:  melanoma 2003 No date: Chest pain No date: Constipation No date: COPD (chronic obstructive pulmonary disease) (HCC) No date: Depression No date: Diarrhea No date: Food allergy No date: Heart valve problem No date: Heartburn No date: History of swelling of feet No date: Hyperlipidemia No date: Hypertension No date: IBS (irritable bowel syndrome) No date: Joint pain No date: Low back pain No date: Melanoma ([place])     Comment:  right foot s/p lymph node removed right groin No date: Obesity (BMI 30-39.9) No date: Palpitation No date: Prediabetes No date: Shortness of breath No date: Urticaria No date: Vaginal delivery     Comment:  X 2   Reproductive/Obstetrics                             Anesthesia  Physical Anesthesia Plan  ASA: 3  Anesthesia Plan: General   Post-op Pain Management:    Induction: Intravenous  PONV Risk Score and Plan: 3 and TIVA and Propofol infusion  Airway Management Planned: Nasal Cannula and Natural Airway  Additional Equipment:   Intra-op Plan:   Post-operative Plan:   Informed Consent: I have reviewed the patients History and Physical, chart, labs and discussed the procedure including the risks, benefits and alternatives for the proposed anesthesia with the patient or authorized representative who has indicated his/her understanding and acceptance.       Plan Discussed with:   Anesthesia Plan Comments:         Anesthesia Quick Evaluation

## 2022-08-05 NOTE — Transfer of Care (Signed)
Immediate Anesthesia Transfer of Care Note  Patient: Brittney Ruiz  Procedure(s) Performed: COLONOSCOPY WITH PROPOFOL  Patient Location: PACU  Anesthesia Type:General  Level of Consciousness: awake, alert , and oriented  Airway & Oxygen Therapy: Patient Spontanous Breathing  Post-op Assessment: Report given to RN and Post -op Vital signs reviewed and stable  Post vital signs: Reviewed  Last Vitals:  Vitals Value Taken Time  BP 92/49 08/05/22 1039  Temp 35.7 C 08/05/22 1039  Pulse 68 08/05/22 1039  Resp 16 08/05/22 1040  SpO2 96 % 08/05/22 1039  Vitals shown include unvalidated device data.  Last Pain:  Vitals:   08/05/22 1039  TempSrc: Temporal  PainSc: Asleep         Complications: No notable events documented.

## 2022-08-05 NOTE — Anesthesia Postprocedure Evaluation (Signed)
Anesthesia Post Note  Patient: TAIA BACH  Procedure(s) Performed: COLONOSCOPY WITH PROPOFOL  Patient location during evaluation: Endoscopy Anesthesia Type: General Level of consciousness: awake and alert Pain management: pain level controlled Vital Signs Assessment: post-procedure vital signs reviewed and stable Respiratory status: spontaneous breathing, nonlabored ventilation, respiratory function stable and patient connected to nasal cannula oxygen Cardiovascular status: blood pressure returned to baseline and stable Postop Assessment: no apparent nausea or vomiting Anesthetic complications: no   There were no known notable events for this encounter.   Last Vitals:  Vitals:   08/05/22 1039 08/05/22 1100  BP: (!) 92/49 (!) 99/59  Pulse: 68 (!) 57  Resp: 19 18  Temp: (!) 35.7 C   SpO2: 96% 97%    Last Pain:  Vitals:   08/05/22 1100  TempSrc:   PainSc: 0-No pain                 Martha Clan

## 2022-08-05 NOTE — H&P (Signed)
Jonathon Bellows, MD [address] [place], [place], [place], [address] [address], [place], [place], [place], [address] Phone: [phone]  Fax: [phone]  Primary Care Physician:  Burnard Hawthorne, FNP   Pre-Procedure History & Physical: HPI:  Brittney Ruiz is a 57 y.o. female is here for an colonoscopy.   Past Medical History:  Diagnosis Date   Abnormal Pap smear of cervix    h/o LEEP   Allergy    Angio-edema    Anxiety    Asthma    Bladder leak    Cancer ([place])    melanoma 2003   Chest pain    Constipation    COPD (chronic obstructive pulmonary disease) (HCC)    Depression    Diarrhea    Food allergy    Heart valve problem    Heartburn    History of swelling of feet    Hyperlipidemia    Hypertension    IBS (irritable bowel syndrome)    Joint pain    Low back pain    Melanoma ([place])    right foot s/p lymph node removed right groin   Obesity (BMI 30-39.9)    Palpitation    Prediabetes    Shortness of breath    Urticaria    Vaginal delivery    X 2    Past Surgical History:  Procedure Laterality Date   BLADDER SUSPENSION  07/2017   COLONOSCOPY WITH PROPOFOL N/A 07/01/2017   Procedure: COLONOSCOPY WITH PROPOFOL;  Surgeon: Virgel Manifold, MD;  Location: [facility] ENDOSCOPY;  Service: Endoscopy;  Laterality: N/A;   ESOPHAGOGASTRODUODENOSCOPY (EGD) WITH PROPOFOL N/A 07/01/2017   Procedure: ESOPHAGOGASTRODUODENOSCOPY (EGD) WITH PROPOFOL;  Surgeon: Virgel Manifold, MD;  Location: [facility] ENDOSCOPY;  Service: Endoscopy;  Laterality: N/A;   LEEP  1995   LEEP     PARTIAL HYSTERECTOMY  08/2017   s/p partial hysterectomy - per patient has fallopian tubes; ovaries INTACT. NO CERVIX on exam 03/06/20   SKIN CANCER EXCISION     melanoma   TONSILLECTOMY      Prior to Admission medications   Medication Sig Start Date End Date Taking? Authorizing Provider  albuterol (VENTOLIN HFA) 108 (90 Base) MCG/ACT inhaler USE 2 INHALATIONS ORALLY   EVERY 6 HOURS AS NEEDED.  06/10/21   Burnard Hawthorne, FNP  Alcohol Swabs (B-D SINGLE USE SWABS REGULAR) PADS USE 1 SWAB TOPICALLY TO    CLEAN FINGER BEFORE        CHECKING GLUCOSE 10/21/21   Burnard Hawthorne, FNP  Ascorbic Acid (VITAMIN C) 1000 MG tablet Take 1,000 mg by mouth daily.    [provider]  aspirin EC 81 MG tablet Take 81 mg by mouth at bedtime.    [provider]  B Complex Vitamins (BL VITAMIN B COMPLEX PO) +Folic acid qd. [id number]   [provider]  Blood Glucose Monitoring Suppl (TRUE METRIX METER) w/Device KIT USED TO CHECK GLUCOSE READINGS  ONE TIME DAILY AS NEEDED. 06/03/21   Burnard Hawthorne, FNP  budesonide (PULMICORT) 0.25 MG/2ML nebulizer solution Take 2 mLs (0.25 mg total) by nebulization 2 (two) times daily. 08/04/22   Tyler Pita, MD  Calcium Carb-Cholecalciferol (CALCIUM 600/VITAMIN D PO) Take 1 tablet by mouth daily.    [provider]  Cholecalciferol (VITAMIN D-3) 25 MCG (1000 UT) CAPS Take 1 capsule by mouth daily.    [provider]  clorazepate (TRANXENE) 7.5 MG tablet Take 7.5 mg by mouth 3 (three) times  daily.    [provider]  fexofenadine (ALLEGRA) 60 MG tablet Take 1 tablet (60 mg total) by mouth daily. Patient taking differently: Take 180 mg by mouth daily. 06/03/21   Burnard Hawthorne, FNP  fluticasone (FLONASE) 50 MCG/ACT nasal spray USE 1 SPRAY IN EACH NOSTRILAT BEDTIME 07/31/22   Burnard Hawthorne, FNP  glucose blood (TRUE METRIX BLOOD GLUCOSE TEST) test strip TEST BLOOD SUGAR ONE TIME DAILY 06/03/21   Burnard Hawthorne, FNP  Multiple Vitamin (MULTIVITAMIN) tablet Take 1 tablet by mouth daily.    [provider]  mupirocin ointment (BACTROBAN) 2 % Apply 1 Application topically daily. 05/20/22   Moye, [place], MD  Omega-3 Fatty Acids (FISH OIL) 1000 MG CAPS Take 2 capsules by mouth daily.    [provider]  omeprazole (PRILOSEC) 20 MG capsule TAKE 1 CAPSULE DAILY 07/07/22   Burnard Hawthorne, FNP   polyethylene glycol powder (GLYCOLAX/MIRALAX) 17 GM/SCOOP powder Take 1 Container by mouth at bedtime.    [provider]  propranolol (INDERAL) 20 MG tablet Take 1 tablet (20 mg total) by mouth 3 (three) times daily. 07/16/15   Jackolyn Confer, MD  psyllium (METAMUCIL) 58.6 % powder Take 1 packet by mouth daily.    [provider]  sertraline (ZOLOFT) 100 MG tablet TAKE 1+1/2 TABLETS BY MOUTH DAILY AS DIRECTED Patient taking differently: Take 112.5 mg by mouth See admin instructions. 112.5 in the morning and 25 mg in the evening 06/14/14   Jackolyn Confer, MD  sertraline (ZOLOFT) 25 MG tablet Take 25 mg by mouth every evening.  08/08/18   [provider]  TRUEplus Lancets 33G MISC TEST BLOOD SUGAR ONE TIME DAILY 06/03/21   Burnard Hawthorne, FNP  Wheat Dextrin (BENEFIBER DRINK MIX PO) Take by mouth.    [provider]    Allergies as of 07/20/2022 - Review Complete 07/13/2022  Allergen Reaction Noted   Elemental sulfur Swelling, Hives, and Other (See Comments) 09/23/1983   Pork allergy Swelling 10/11/2012   Pork-derived products Swelling 10/11/2012   Aciphex [rabeprazole sodium]  03/10/2011   Antivert [meclizine hcl]  03/10/2011   Buspar [buspirone hcl]  03/10/2011   Cat hair extract  10/11/2012   Chlorpheniramine-pseudoeph  03/10/2011   Chlorpheniramine-pseudoeph Other (See Comments) 06/05/2016   Chocolate flavor  10/14/2012   Codeine  03/10/2011   Crestor [rosuvastatin calcium]  06/10/2017   Cymbalta [duloxetine hcl]  03/10/2011   Duloxetine Other (See Comments) 10/11/2012   Effexor xr [venlafaxine hydrochloride]  03/10/2011   Epinephrine  03/10/2011   Erythromycin  03/10/2011   Escitalopram oxalate  03/10/2011   Lamictal [lamotrigine]  03/10/2011   Levofloxacin Swelling 04/03/2011   Lipitor [atorvastatin]  08/09/2015   Meclizine Other (See Comments) 10/11/2012   Mometasone furoate  03/10/2011   Mometasone furoate  03/10/2011    Monosodium glutamate  10/11/2012   Neurontin [gabapentin]  03/10/2011   Other  10/11/2012   Prednisone  03/10/2011   Red yeast rice extract  10/18/2020   Shellfish allergy  10/11/2012   Sudafed [pseudoephedrine hcl]  03/10/2011   Sulfa antibiotics Other (See Comments) 03/10/2011   Sulfasalazine Other (See Comments) 03/10/2011   Valium  03/10/2011   Wellbutrin [bupropion hcl]  03/10/2011   Wixela inhub [fluticasone-salmeterol]  06/01/2019   Xanax xr [alprazolam]  03/27/2011   Zetia [ezetimibe]  02/29/2020   Alfalfa Rash 10/15/2012   Brompheniramine-phenylephrine Palpitations 12/05/2012   Buspirone Rash and Other (See Comments) 10/11/2012   Tape Rash 03/10/2011  Family History  Problem Relation Age of Onset   Hyperlipidemia Mother    Hypertension Mother    Diabetes Mother    Breast cancer Mother 51   Stroke Mother    Cancer Mother    Cancer Father        lung   Depression Father    Diabetes Father    Hypertension Father    Hyperlipidemia Father    Sleep apnea Father    Alcoholism Father    Hyperlipidemia Sister    Hypertension Sister    Migraines Sister    Hypertension Brother    Hyperlipidemia Brother    Migraines Brother    Cancer Maternal Grandmother        glioblastoma   Depression Maternal Grandfather    Heart disease Maternal Grandfather    Cancer Paternal Grandfather        unknown   AAA (abdominal aortic aneurysm) Paternal Grandfather     Social History   Socioeconomic History   Marital status: Married    Spouse name: Not on file   Number of children: Not on file   Years of education: Not on file   Highest education level: Not on file  Occupational History   Occupation: disability  Tobacco Use   Smoking status: Former    Packs/day: 1.00    Years: 25.00    Additional pack years: 0.00    Total pack years: 25.00    Types: Cigarettes    Quit date: 2008    Years since quitting: 16.2   Smokeless tobacco: Never  Vaping Use   Vaping Use: Never  used  Substance and Sexual Activity   Alcohol use: No   Drug use: No   Sexual activity: Yes  Other Topics Concern   Not on file  Social History Narrative   Married 25 years.        2 children by 1st husband.       Disabled. Not working.    Right handed      One story home      Social Determinants of Health   Financial Resource Strain: Low Risk  (07/10/2021)   Overall Financial Resource Strain (CARDIA)    Difficulty of Paying Living Expenses: Not hard at all  Food Insecurity: No Food Insecurity (07/12/2022)   Hunger Vital Sign    Worried About Running Out of Food in the Last Year: Never true    Ran Out of Food in the Last Year: Never true  Transportation Needs: No Transportation Needs (07/12/2022)   PRAPARE - Hydrologist (Medical): No    Lack of Transportation (Non-Medical): No  Physical Activity: Inactive (07/12/2022)   Exercise Vital Sign    Days of Exercise per Week: 0 days    Minutes of Exercise per Session: 0 min  Stress: Stress Concern Present (07/12/2022)   [place]    Feeling of Stress : Very much  Social Connections: Unknown (07/12/2022)   Social Connection and Isolation Panel [NHANES]    Frequency of Communication with Friends and Family: Three times a week    Frequency of Social Gatherings with Friends and Family: Once a week    Attends Religious Services: Not on file    Active Member of Clubs or Organizations: Yes    Attends Archivist Meetings: 1 to 4 times per year    Marital Status: Married  Intimate Partner Violence: Not At Risk (07/13/2022)  Humiliation, Afraid, Rape, and Kick questionnaire    Fear of Current or Ex-Partner: No    Emotionally Abused: No    Physically Abused: No    Sexually Abused: No    Review of Systems: See HPI, otherwise negative ROS  Physical Exam: Ht 5\' 2"  (1.575 m)   Wt 93 kg   LMP  (LMP Unknown)   BMI 37.49 kg/m   General:   Alert,  pleasant and cooperative in NAD Head:  Normocephalic and atraumatic. Neck:  Supple; no masses or thyromegaly. Lungs:  Clear throughout to auscultation, normal respiratory effort.    Heart:  +S1, +S2, Regular rate and rhythm, No edema. Abdomen:  Soft, nontender and nondistended. Normal bowel sounds, without guarding, and without rebound.   Neurologic:  Alert and  oriented x4;  grossly normal neurologically.  Impression/Plan: TELIYAH ALZATE is here for an colonoscopy to be performed for surveillance due to prior history of colon polyps   Risks, benefits, limitations, and alternatives regarding  colonoscopy have been reviewed with the patient.  Questions have been answered.  All parties agreeable.   Jonathon Bellows, MD  08/05/2022, 9:53 AM

## 2022-08-06 ENCOUNTER — Encounter: Payer: Self-pay | Admitting: Gastroenterology

## 2022-08-06 LAB — SURGICAL PATHOLOGY

## 2022-08-07 ENCOUNTER — Telehealth: Payer: Self-pay | Admitting: Family

## 2022-08-07 DIAGNOSIS — E782 Mixed hyperlipidemia: Secondary | ICD-10-CM

## 2022-08-07 DIAGNOSIS — Z789 Other specified health status: Secondary | ICD-10-CM

## 2022-08-07 NOTE — Telephone Encounter (Signed)
Spoke to pt and she is agreeable to referral for lipid clinic

## 2022-08-07 NOTE — Telephone Encounter (Signed)
Call pt  Please advise her that her cholesterol remains quite elevated. I reviewed labs obtained from Dr Valetta Close.   I would recommend returning to lipid clinic, Dr Debara Pickett.  If agreeable, please let me know and I will place referral   I have reviewed ct chest 07/29/22  Aortic atherosclerosis present.   Reviewed prior cardiology workup including CT calcium score of 0 2/26 2020.  Patient has been intolerant to statins in the past.    Telephone note from pulmonology Eric Form 07/31/2022  in contacted patient in regards to new right lower lobe nodule which was suspicious.  Patient is scheduled for CT chest in 3 months time.  She was also seen by Dr Patsey Berthold  08/04/2022 and started on budesonide. Will follow

## 2022-08-08 DIAGNOSIS — F419 Anxiety disorder, unspecified: Secondary | ICD-10-CM | POA: Diagnosis not present

## 2022-08-08 DIAGNOSIS — K08409 Partial loss of teeth, unspecified cause, unspecified class: Secondary | ICD-10-CM | POA: Diagnosis not present

## 2022-08-08 DIAGNOSIS — M1990 Unspecified osteoarthritis, unspecified site: Secondary | ICD-10-CM | POA: Diagnosis not present

## 2022-08-08 DIAGNOSIS — Z8249 Family history of ischemic heart disease and other diseases of the circulatory system: Secondary | ICD-10-CM | POA: Diagnosis not present

## 2022-08-08 DIAGNOSIS — G4733 Obstructive sleep apnea (adult) (pediatric): Secondary | ICD-10-CM | POA: Diagnosis not present

## 2022-08-08 DIAGNOSIS — K219 Gastro-esophageal reflux disease without esophagitis: Secondary | ICD-10-CM | POA: Diagnosis not present

## 2022-08-08 DIAGNOSIS — R69 Illness, unspecified: Secondary | ICD-10-CM | POA: Diagnosis not present

## 2022-08-08 DIAGNOSIS — Z87891 Personal history of nicotine dependence: Secondary | ICD-10-CM | POA: Diagnosis not present

## 2022-08-08 DIAGNOSIS — R32 Unspecified urinary incontinence: Secondary | ICD-10-CM | POA: Diagnosis not present

## 2022-08-08 DIAGNOSIS — J449 Chronic obstructive pulmonary disease, unspecified: Secondary | ICD-10-CM | POA: Diagnosis not present

## 2022-08-08 DIAGNOSIS — K5900 Constipation, unspecified: Secondary | ICD-10-CM | POA: Diagnosis not present

## 2022-08-08 DIAGNOSIS — I10 Essential (primary) hypertension: Secondary | ICD-10-CM | POA: Diagnosis not present

## 2022-08-10 DIAGNOSIS — M5136 Other intervertebral disc degeneration, lumbar region: Secondary | ICD-10-CM | POA: Diagnosis not present

## 2022-08-10 DIAGNOSIS — M47816 Spondylosis without myelopathy or radiculopathy, lumbar region: Secondary | ICD-10-CM | POA: Diagnosis not present

## 2022-08-10 DIAGNOSIS — M5416 Radiculopathy, lumbar region: Secondary | ICD-10-CM | POA: Diagnosis not present

## 2022-08-10 DIAGNOSIS — M4310 Spondylolisthesis, site unspecified: Secondary | ICD-10-CM | POA: Diagnosis not present

## 2022-08-11 ENCOUNTER — Telehealth: Payer: Self-pay | Admitting: Pulmonary Disease

## 2022-08-11 ENCOUNTER — Other Ambulatory Visit: Payer: Self-pay | Admitting: Family Medicine

## 2022-08-11 DIAGNOSIS — M5416 Radiculopathy, lumbar region: Secondary | ICD-10-CM

## 2022-08-11 DIAGNOSIS — J45909 Unspecified asthma, uncomplicated: Secondary | ICD-10-CM

## 2022-08-11 NOTE — Telephone Encounter (Signed)
Spoke to patient.  She is requesting order for nebulizer machine. She picked up nebulizer solution but does not have a machine.  [facility] request. Okay per Dr. Patsey Berthold verbally. Nothing further needed.

## 2022-08-11 NOTE — Telephone Encounter (Signed)
Pt. Calling about neb. Machine and looked like no order was placed for one and she got the meds for it but can't use it yet

## 2022-08-11 NOTE — Telephone Encounter (Signed)
I have placed referral

## 2022-08-11 NOTE — Telephone Encounter (Signed)
Spoke to pt and informed her referral has been placed pt verbalized understanding

## 2022-08-13 DIAGNOSIS — J45909 Unspecified asthma, uncomplicated: Secondary | ICD-10-CM | POA: Diagnosis not present

## 2022-08-18 ENCOUNTER — Telehealth (INDEPENDENT_AMBULATORY_CARE_PROVIDER_SITE_OTHER): Payer: Medicare HMO | Admitting: Psychology

## 2022-08-18 DIAGNOSIS — F419 Anxiety disorder, unspecified: Secondary | ICD-10-CM | POA: Diagnosis not present

## 2022-08-18 DIAGNOSIS — F32A Depression, unspecified: Secondary | ICD-10-CM

## 2022-08-18 DIAGNOSIS — F5089 Other specified eating disorder: Secondary | ICD-10-CM

## 2022-08-18 DIAGNOSIS — R69 Illness, unspecified: Secondary | ICD-10-CM | POA: Diagnosis not present
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
## 2022-08-19 ENCOUNTER — Ambulatory Visit
Admission: RE | Admit: 2022-08-19 | Discharge: 2022-08-19 | Disposition: A | Discharge status: Home / Self Care | Payer: Medicare HMO | Source: Ambulatory Visit | Attending: Family Medicine | Admitting: Family Medicine

## 2022-08-19 DIAGNOSIS — M545 Low back pain, unspecified: Secondary | ICD-10-CM | POA: Diagnosis not present

## 2022-08-19 DIAGNOSIS — M5416 Radiculopathy, lumbar region: Secondary | ICD-10-CM

## 2022-08-19 DIAGNOSIS — M5136 Other intervertebral disc degeneration, lumbar region: Secondary | ICD-10-CM | POA: Diagnosis not present

## 2022-08-19 DIAGNOSIS — M4316 Spondylolisthesis, lumbar region: Secondary | ICD-10-CM | POA: Diagnosis not present

## 2022-08-24 ENCOUNTER — Other Ambulatory Visit: Payer: Medicare HMO

## 2022-09-07 ENCOUNTER — Ambulatory Visit (INDEPENDENT_AMBULATORY_CARE_PROVIDER_SITE_OTHER): Payer: Medicare HMO | Admitting: Physician Assistant

## 2022-09-08 ENCOUNTER — Telehealth (INDEPENDENT_AMBULATORY_CARE_PROVIDER_SITE_OTHER): Payer: Medicare HMO | Admitting: Psychology

## 2022-09-08 DIAGNOSIS — F5089 Other specified eating disorder: Secondary | ICD-10-CM

## 2022-09-08 DIAGNOSIS — R69 Illness, unspecified: Secondary | ICD-10-CM | POA: Diagnosis not present

## 2022-09-08 DIAGNOSIS — F32A Depression, unspecified: Secondary | ICD-10-CM

## 2022-09-08 DIAGNOSIS — F419 Anxiety disorder, unspecified: Secondary | ICD-10-CM

## 2022-09-08 NOTE — Progress Notes (Signed)
  Office: [phone]  /  Fax: [phone]    Date: September 08, 2022    Appointment Start Time: 11:35am Duration: 28 minutes Provider: Lawerance Cruel, Psy.D. Type of Session: Individual Therapy  Location of Patient: Home (private location) Location of Provider: Provider's Home (private office) Type of Contact: Telepsychological Visit via MyChart Video Visit  Session Content: Lanna is a 57 y.o. female presenting for a follow-up appointment to address the previously established treatment goal of increasing coping skills.Today's appointment was a telepsychological visit. Rejeana provided verbal consent for today's telepsychological appointment and she is aware she is responsible for securing confidentiality on her end of the session. Prior to proceeding with today's appointment, Jhene's physical location at the time of this appointment was obtained as well a phone number she could be reached at in the event of technical difficulties. Brisa and this provider participated in today's telepsychological service.   This provider conducted a brief check-in. Keshanna shared about recent stressors, including family conflict and health-related concerns. Further explored and processed. Reviewed emotional and physical hunger. Psychoeducation provided regarding self-compassion. Dot was engaged in a self-compassion exercise to help with eating-related challenges and other ongoing stressors. She was encouraged to regularly ask herself, "What do I need right now?" and  "How can I comfort and care for myself in this moment? Zaiyah was receptive to today's appointment as evidenced by openness to sharing, responsiveness to feedback, and willingness to work toward increasing self-compassion.  Mental Status Examination:  Appearance: neat Behavior: appropriate to circumstances Mood: sad Affect: mood congruent Speech: WNL Eye Contact: appropriate Psychomotor Activity: WNL Gait: unable to assess Thought Process: linear, logical,  and goal directed and no evidence or endorsement of suicidal, homicidal, and self-harm ideation, plan and intent  Thought Content/Perception: no hallucinations, delusions, bizarre thinking or behavior endorsed or observed Orientation: AAOx4 Memory/Concentration: memory, attention, language, and fund of knowledge intact  Insight: fair Judgment: fair  Interventions:  Conducted a brief chart review Provided empathic reflections and validation Reviewed content from the previous session Provided positive reinforcement Employed supportive psychotherapy interventions to facilitate reduced distress and to improve coping skills with identified stressors Psychoeducation provided regarding self-compassion Engaged pt in a self-compassion exercise  DSM-5 Diagnosis(es):  F50.89 Other Specified Feeding or Eating Disorder, Emotional Eating Behaviors, F41.9 Unspecified Anxiety Disorder, and  F32.A Unspecified Depressive Disorder  Treatment Goal & Progress: During the initial appointment with this provider, the following treatment goal was established: increase coping skills. Progress is limited, as Calysta has just begun treatment with this provider; however, she is receptive to the interaction and interventions and rapport is being established.   Plan: The next appointment is scheduled for 09/22/2022 at 4pm, which will be via MyChart Video Visit. The next session will focus on working towards the established treatment goal. Adelina reported she is in the process of trying to establish care for therapeutic services, but experiencing challenges with providers accepting her insurance. Additionally, she provided verbal consent for this provider to place a referral with [facility].

## 2022-09-09 ENCOUNTER — Ambulatory Visit (INDEPENDENT_AMBULATORY_CARE_PROVIDER_SITE_OTHER): Payer: Medicare HMO | Admitting: Internal Medicine

## 2022-09-09 ENCOUNTER — Telehealth: Payer: Self-pay | Admitting: Internal Medicine

## 2022-09-09 ENCOUNTER — Encounter (HOSPITAL_BASED_OUTPATIENT_CLINIC_OR_DEPARTMENT_OTHER): Payer: Self-pay | Admitting: Internal Medicine

## 2022-09-09 VITALS — BP 119/83 | HR 64 | Ht 62.0 in | Wt 208.6 lb

## 2022-09-09 DIAGNOSIS — E782 Mixed hyperlipidemia: Secondary | ICD-10-CM

## 2022-09-09 DIAGNOSIS — T466X5D Adverse effect of antihyperlipidemic and antiarteriosclerotic drugs, subsequent encounter: Secondary | ICD-10-CM

## 2022-09-09 DIAGNOSIS — M791 Myalgia, unspecified site: Secondary | ICD-10-CM

## 2022-09-09 NOTE — Patient Instructions (Addendum)
Medication Instructions:  Dr. Rennis Golden has suggested [facility] -- this is administered by a health care provider once every 6 months. There is a loading dose that is 2 injections, given 3 months apart.   [facility] [address] [place],  [place]  [address] Phone: [phone]  *If you need a refill on your cardiac medications before your next appointment, please call your pharmacy*   Lab Work: FASTING lab work in about 5 months  If you have labs (blood work) drawn today and your tests are completely normal, you will receive your results only by: MyChart Message (if you have MyChart) OR A paper copy in the mail If you have any lab test that is abnormal or we need to change your treatment, we will call you to review the results.   Follow-Up: At [facility], you and your health needs are our priority.  As part of our continuing mission to provide you with exceptional heart care, we have created designated Provider Care Teams.  These Care Teams include your primary Cardiologist (physician) and Advanced Practice Providers (APPs -  Physician Assistants and Nurse Practitioners) who all work together to provide you with the care you need, when you need it.  We recommend signing up for the patient portal called "MyChart".  Sign up information is provided on this After Visit Summary.  MyChart is used to connect with patients for Virtual Visits (Telemedicine).  Patients are able to view lab/test results, encounter notes, upcoming appointments, etc.  Non-urgent messages can be sent to your provider as well.   To learn more about what you can do with MyChart, go to [email or web address].    Your next appointment:    5 months with Dr. Rennis Golden

## 2022-09-09 NOTE — Progress Notes (Addendum)
LIPID CLINIC CONSULT NOTE  Chief Complaint:  Follow-up dyslipidemia  Primary Care Physician: Allegra Grana, FNP  Primary Cardiologist:  None  HPI:  Brittney Ruiz is a 57 y.o. female who is being seen today for the evaluation of dyslipidemia at the request of Brittney Ruiz Records, FNP. This is a 57 year old female kindly referred for evaluation management of dyslipidemia.  She reports this is a longstanding issue which is basically related to weight.  She has had issues with anxiety and depression and was on antidepressant medications and gained over 100 pounds.  She is been working to lose out and is managed to lose about 50 pounds.  She has a family history of heart disease she says in her father and grandfather and has COPD and other cardiovascular risk factors.  She has been seen by my partner Dr. Kirke Corin about released from cardiology due to some mitral valve disease which apparently improved.  She did have a CT coronary angiogram in 2020 which showed no evidence of coronary artery disease and 0 coronary calcium.  I share that information with her again today and the importance of the very high negative predictive value of a study like that.  Overall I find that reassuring despite the fact that her cholesterol has been elevated.  In general the lipids have been slowly coming down, but she cannot tolerate any medications.  She cannot take any statins due to side effects.  She cannot take ezetimibe on a daily basis and currently only takes a quarter of a tablet 1 or 2 times a week.  She has over 41 different allergies to medications, foods and other products.  She reports being fairly sedentary due to issues with plantar fasciitis.  She continues to work with the weight loss clinic regarding dietary approaches to weight loss.  Her most recent lipid profile showed total cholesterol 217, triglycerides 148, HDL 30 and LDL of 160.  10/18/2020  Brittney Ruiz returns today for video visit.  Although she  has been making great strides in her diet and has lost 50 pounds with significant improvement in her food choices, her cholesterol still remains elevated.  She was initially trying to take a quarter of the 20 mg tablets her husband was taking of rosuvastatin.  She says she slowly increase that dose from 5 mg a day up to 20 mg a day, 1 full tablet over a period of 4 weeks.  Ultimately then she developed significant side effects.  She stopped it for 2 weeks and then tried to go back to red yeast rice which also caused her side effects including significant myalgias.  Cholesterol remains very high in fact up close to 190 and there is a very strong family history of multiple family members that have high cholesterol on statins.  This could be a familial hyperlipidemia, especially without the significant change in cholesterol numbers.  09/09/2022  Brittney Ruiz is seen today in follow-up. We had previously arranged to put her on Repatha which she took one dose and had side effects, however, her cholesterol did improve substantially. She has continued to lose weight, however, her cholesterol has not further improved.  Recent repeat lipids show TC 238, TG 160, HDL 33, and LDL 175.  She was referred back to see what additional options are available.    PMHx:  Past Medical History:  Diagnosis Date   Abnormal Pap smear of cervix    h/o LEEP   Allergy    Angio-edema  Anxiety    Asthma    Bladder leak    Cancer    melanoma 2003   Chest pain    Constipation    COPD (chronic obstructive pulmonary disease)    Depression    Diarrhea    Food allergy    Heart valve problem    Heartburn    History of swelling of feet    Hyperlipidemia    Hypertension    IBS (irritable bowel syndrome)    Joint pain    Low back pain    Melanoma    right foot s/p lymph node removed right groin   Obesity (BMI 30-39.9)    Palpitation    Prediabetes    Shortness of breath    Urticaria    Vaginal delivery    X 2     Past Surgical History:  Procedure Laterality Date   BLADDER SUSPENSION  07/2017   COLONOSCOPY WITH PROPOFOL N/A 07/01/2017   Procedure: COLONOSCOPY WITH PROPOFOL;  Surgeon: Pasty Spillers, MD;  Location: [facility] ENDOSCOPY;  Service: Endoscopy;  Laterality: N/A;   COLONOSCOPY WITH PROPOFOL N/A 08/05/2022   Procedure: COLONOSCOPY WITH PROPOFOL;  Surgeon: Wyline Mood, MD;  Location: [facility] ENDOSCOPY;  Service: Gastroenterology;  Laterality: N/A;   ESOPHAGOGASTRODUODENOSCOPY (EGD) WITH PROPOFOL N/A 07/01/2017   Procedure: ESOPHAGOGASTRODUODENOSCOPY (EGD) WITH PROPOFOL;  Surgeon: Pasty Spillers, MD;  Location: [facility] ENDOSCOPY;  Service: Endoscopy;  Laterality: N/A;   LEEP  1995   LEEP     PARTIAL HYSTERECTOMY  08/2017   s/p partial hysterectomy - per patient has fallopian tubes; ovaries INTACT. NO CERVIX on exam 03/06/20   SKIN CANCER EXCISION     melanoma   TONSILLECTOMY      FAMHx:  Family History  Problem Relation Age of Onset   Hyperlipidemia Mother    Hypertension Mother    Diabetes Mother    Breast cancer Mother 30   Stroke Mother    Cancer Mother    Cancer Father        lung   Depression Father    Diabetes Father    Hypertension Father    Hyperlipidemia Father    Sleep apnea Father    Alcoholism Father    Hyperlipidemia Sister    Hypertension Sister    Migraines Sister    Hypertension Brother    Hyperlipidemia Brother    Migraines Brother    Cancer Maternal Grandmother        glioblastoma   Depression Maternal Grandfather    Heart disease Maternal Grandfather    Cancer Paternal Grandfather        unknown   AAA (abdominal aortic aneurysm) Paternal Grandfather     SOCHx:   reports that she quit smoking about 16 years ago. Her smoking use included cigarettes. She has a 25.00 pack-year smoking history. She has never used smokeless tobacco. She reports that she does not drink alcohol and does not use drugs.  ALLERGIES:  Allergies  Allergen Reactions    Codeine Other (See Comments) and Hives   Elemental Sulfur Swelling, Hives and Other (See Comments)   Pork Allergy Swelling    Other reaction(s): OTHER   Pork-Derived Products Swelling    Other reaction(s): OTHER Other reaction(s): OTHER Other reaction(s): OTHER    Aciphex [Rabeprazole Sodium]    Antivert [Meclizine Hcl]     Causes dizziness and hives   Buspar [Buspirone Hcl]     hives   Cat Hair Extract     Other reaction(s):  SHORTNESS OF BREATH   Chlorpheniramine-Pseudoeph    Chlorpheniramine-Pseudoeph Other (See Comments)    Messes with her anxiety   Chocolate Flavor    Crestor [Rosuvastatin Calcium]     Could not tolerate    Cymbalta [Duloxetine Hcl]    Duloxetine Other (See Comments)    Other reaction(s): HIVES Other reaction(s): HIVES    Effexor Xr [Venlafaxine Hydrochloride]     hives   Epinephrine    Erythromycin     Throwing up   Escitalopram Oxalate    Lamictal [Lamotrigine]     hives   Levofloxacin Swelling    Levofloxacin   Lipitor [Atorvastatin]     Multiple symptoms   Meclizine Other (See Comments)   Mometasone Furoate    Mometasone Furoate    Monosodium Glutamate     Other reaction(s): OTHER Other reaction(s): OTHER    Neurontin [Gabapentin]     Hives    Other     Other reaction(s): HIVES Other reaction(s): HIVES Other reaction(s): HIVES Other reaction(s): HIVES    Prednisone    Red Yeast Rice Extract     aches   Rosuvastatin    Shellfish Allergy     Other reaction(s): HIVES Other reaction(s): HIVES   Sudafed [Pseudoephedrine Hcl]     Causes severe anxiety   Sulfa Antibiotics Other (See Comments)    Other reaction(s): HIVES Other reaction(s): HIVES    Sulfasalazine Other (See Comments)   Valium    Wellbutrin [Bupropion Hcl]    Wixela Inhub [Fluticasone-Salmeterol]     Increased heart rate   Xanax Xr [Alprazolam]     Hives and anxiety   Zetia [Ezetimibe]     body aches on full dose   Alfalfa Rash   Brompheniramine-Phenylephrine  Palpitations    hypersensitive     Buspirone Rash and Other (See Comments)   Tape Rash    ROS: Pertinent items noted in HPI and remainder of comprehensive ROS otherwise negative.  HOME MEDS: Current Outpatient Medications on File Prior to Visit  Medication Sig Dispense Refill   albuterol (VENTOLIN HFA) 108 (90 Base) MCG/ACT inhaler USE 2 INHALATIONS ORALLY   EVERY 6 HOURS AS NEEDED. 18 g 1   Alcohol Swabs (B-D SINGLE USE SWABS REGULAR) PADS USE 1 SWAB TOPICALLY TO    CLEAN FINGER BEFORE        CHECKING GLUCOSE 300 each 1   aspirin EC 81 MG tablet Take 81 mg by mouth at bedtime.     B Complex Vitamins (BL VITAMIN B COMPLEX PO) +Folic acid qd.     Blood Glucose Monitoring Suppl (TRUE METRIX METER) w/Device KIT USED TO CHECK GLUCOSE READINGS  ONE TIME DAILY AS NEEDED. 1 kit 0   budesonide (PULMICORT) 0.25 MG/2ML nebulizer solution Take 2 mLs (0.25 mg total) by nebulization 2 (two) times daily. 60 mL 2   clorazepate (TRANXENE) 7.5 MG tablet Take 7.5 mg by mouth 3 (three) times daily.     fexofenadine (ALLEGRA) 60 MG tablet Take 1 tablet (60 mg total) by mouth daily. (Patient taking differently: Take 180 mg by mouth daily.) 90 tablet 1   fluticasone (FLONASE) 50 MCG/ACT nasal spray USE 1 SPRAY IN EACH NOSTRILAT BEDTIME 16 g 3   glucose blood (TRUE METRIX BLOOD GLUCOSE TEST) test strip TEST BLOOD SUGAR ONE TIME DAILY 100 strip 0   Multiple Vitamin (MULTIVITAMIN) tablet Take 1 tablet by mouth daily.     mupirocin ointment (BACTROBAN) 2 % Apply 1 Application topically daily. 22 g 0  Omega-3 Fatty Acids (FISH OIL) 1000 MG CAPS Take 2 capsules by mouth daily.     omeprazole (PRILOSEC) 20 MG capsule TAKE 1 CAPSULE DAILY 90 capsule 2   polyethylene glycol powder (GLYCOLAX/MIRALAX) 17 GM/SCOOP powder Take 1 Container by mouth at bedtime.     propranolol (INDERAL) 20 MG tablet Take 1 tablet (20 mg total) by mouth 3 (three) times daily. 90 tablet 3   psyllium (METAMUCIL) 58.6 % powder Take 1 packet by  mouth daily.     sertraline (ZOLOFT) 100 MG tablet TAKE 1+1/2 TABLETS BY MOUTH DAILY AS DIRECTED (Patient taking differently: Take 112.5 mg by mouth See admin instructions. 112.5 in the morning and 25 mg in the evening) 45 tablet 3   sertraline (ZOLOFT) 25 MG tablet Take 25 mg by mouth every evening.      TRUEplus Lancets 33G MISC TEST BLOOD SUGAR ONE TIME DAILY 100 each 0   No current facility-administered medications on file prior to visit.    LABS/IMAGING: No results found for this or any previous visit (from the past 48 hour(s)). No results found.  LIPID PANEL:    Component Value Date/Time   CHOL 238 (H) 07/30/2022 1140   TRIG 160 (H) 07/30/2022 1140   HDL 33 (L) 07/30/2022 1140   CHOLHDL 7.2 (H) 07/30/2022 1140   CHOLHDL 8 05/02/2018 1457   VLDL 72.0 (H) 05/02/2018 1457   LDLCALC 175 (H) 07/30/2022 1140   LDLDIRECT 141.0 05/02/2018 1457    WEIGHTS: Wt Readings from Last 3 Encounters:  09/09/22 208 lb 9.6 oz (94.6 kg)  08/05/22 205 lb (93 kg)  08/04/22 204 lb 6.4 oz (92.7 kg)    VITALS: BP 119/83 (BP Location: Left Arm, Patient Position: Sitting, Cuff Size: Large)   Pulse 64   Ht  (1.575 m)   Wt 208 lb 9.6 oz (94.6 kg)   LMP  (LMP Unknown)   SpO2 95%   BMI 38.15 kg/m   EXAM: Deferred  EKG: Deferred  ASSESSMENT: Mixed dyslipidemia, possibly familial hyperlipidemia No CAD with a 0 coronary calcium score (2020) Family history of heart disease in her father and grandfather Obesity with weight loss efforts in place Statin and ezetimibe intolerance Unable to tolerate Repatha d/t side effects Aortic atherosclerosis seen on CT imaging  PLAN: 1.   Ms. Marcella has not been able to tolerate statins and ezetimibe. She did have a positive response to Repatha, but she had side effects. We discussed the limited options available to her at this point. She may be able to tolerate Leqvio - cost has been an issue with this medication, however, she does have Medicare  coverage d/t disability which may cover this. We will reach out for prior authorization.  If approved, follow-up in 6 months with a lipid NMR and LP(a). If we are not able to do this, there is the option for outside referral for apheresis as we do not perform this in our clinic, but it would be effective.  Chrystie Nose, MD, [facility], FACP  [place]  [facility]  Medical Director of the [facility]  [facility]ovascular [facility] Reduction Clinic Diplomate of the American Board of Clinical Lipidology Attending Cardiologist  Direct Dial: [phone]  Fax: [phone]  Website:  [email or web address] Nicely Jeshurun [place] 09/09/2022, 9:21 AM

## 2022-09-09 NOTE — Telephone Encounter (Signed)
Patient has been identified as candidate for Leqvio Benefits investigation form faxed 09/09/22 

## 2022-09-10 DIAGNOSIS — M5136 Other intervertebral disc degeneration, lumbar region: Secondary | ICD-10-CM | POA: Diagnosis not present

## 2022-09-10 DIAGNOSIS — M47816 Spondylosis without myelopathy or radiculopathy, lumbar region: Secondary | ICD-10-CM | POA: Diagnosis not present

## 2022-09-10 DIAGNOSIS — M5380 Other specified dorsopathies, site unspecified: Secondary | ICD-10-CM | POA: Diagnosis not present

## 2022-09-10 DIAGNOSIS — M4310 Spondylolisthesis, site unspecified: Secondary | ICD-10-CM | POA: Diagnosis not present

## 2022-09-10 DIAGNOSIS — M5416 Radiculopathy, lumbar region: Secondary | ICD-10-CM | POA: Diagnosis not present

## 2022-09-11 ENCOUNTER — Telehealth: Payer: Self-pay | Admitting: Pulmonary Disease

## 2022-09-11 DIAGNOSIS — J209 Acute bronchitis, unspecified: Secondary | ICD-10-CM

## 2022-09-11 MED ORDER — ALBUTEROL SULFATE HFA 108 (90 BASE) MCG/ACT IN AERS: INHALATION_SPRAY | RESPIRATORY_TRACT | 1 refills | Status: AC

## 2022-09-11 NOTE — Telephone Encounter (Signed)
Called and spoke to patient.  She stated that since starting Pulmicort, she has developed wheezing, voice hoariness and increased SOB.  She is questioning if Pulmicort could be causing these sx and would an inhaler would be more effective.  Dr. Jayme Cloud, please advise. Thanks

## 2022-09-11 NOTE — Telephone Encounter (Signed)
According to what she told me during her only visit with me she has multiple sensitivities to inhalers.  Asthma has to be treated with anti-inflammatory medications and she seems to be sensitive to all of these.  The reason why the nebulizer Pulmicort was chosen as because it no longer exists as an inhaler as the manufacturer stopped producing it.  She needs to make sure she is rinsing her mouth well after she uses the Pulmicort which was the lowest dose we could provide for her.  If this symptoms persist then she can revert to using her as needed albuterol but realized that her asthma is not optimally treated.  She should be having PFTs performed prior to follow-up visit and then we can determine if any other inhalers may be indicated.

## 2022-09-11 NOTE — Telephone Encounter (Signed)
Pt. Having issues with the neb machine meds that she takes and she wants to know if a inhaler can be prescribed to her instead can we please advise her

## 2022-09-11 NOTE — Telephone Encounter (Signed)
Spoke to patient and relayed below message/recommendations. She will try albuterol PRN and trail off of pulmicort.  Albuterol sent to preferred pharmacy.  Nothing further needed.

## 2022-09-14 ENCOUNTER — Encounter (INDEPENDENT_AMBULATORY_CARE_PROVIDER_SITE_OTHER): Payer: Self-pay | Admitting: Physician Assistant

## 2022-09-14 ENCOUNTER — Ambulatory Visit (INDEPENDENT_AMBULATORY_CARE_PROVIDER_SITE_OTHER): Payer: Medicare HMO | Admitting: Physician Assistant

## 2022-09-14 VITALS — BP 124/83 | HR 60 | Temp 97.9°F | Ht 62.0 in | Wt 205.0 lb

## 2022-09-14 DIAGNOSIS — Z6837 Body mass index (BMI) 37.0-37.9, adult: Secondary | ICD-10-CM | POA: Diagnosis not present

## 2022-09-14 DIAGNOSIS — E785 Hyperlipidemia, unspecified: Secondary | ICD-10-CM

## 2022-09-14 DIAGNOSIS — R7303 Prediabetes: Secondary | ICD-10-CM

## 2022-09-14 DIAGNOSIS — F5089 Other specified eating disorder: Secondary | ICD-10-CM | POA: Diagnosis not present

## 2022-09-14 DIAGNOSIS — E669 Obesity, unspecified: Secondary | ICD-10-CM | POA: Diagnosis not present

## 2022-09-14 DIAGNOSIS — E782 Mixed hyperlipidemia: Secondary | ICD-10-CM

## 2022-09-14 NOTE — Progress Notes (Unsigned)
Office: [phone]  /  Fax: [phone]  WEIGHT SUMMARY AND BIOMETRICS  Vitals Temp: 97.9 F (36.6 C) BP: 124/83 Pulse Rate: 60 SpO2: 99 %   Anthropometric Measurements Height: 5\' 2"  (1.575 m) Weight: 205 lb (93 kg) BMI (Calculated): 37.49 Weight at Last Visit: 198 lb Weight Gained Since Last Visit: 7 lb Starting Weight: 217 lb   Body Composition  Body Fat %: 47.7 % Fat Mass (lbs): 97.8 lbs Muscle Mass (lbs): 101.8 lbs Total Body Water (lbs): 77.8 lbs Visceral Fat Rating : 14   Other Clinical Data Fasting: no Labs: no Today's Visit #: 38 Starting Date: 06/01/19     HPI  Chief Complaint: OBESITY  Brittney Ruiz is here to discuss her progress with her obesity treatment plan. She is on the keeping a food journal and adhering to recommended goals of 1400-1600 calories and 90 grams of protein and states she is following her eating plan approximately 75 % of the time. She states she is exercising/pedal stepper 10 minutes 5 times per week.   Interval History:  Since last office visit she is up 7 lbs. Stress- very high due to multiple competing concerns: Low back pain/DDD- due injection soon        Plantar fasciitis `        Meningioma- with visual changes         Lung nodule?- Due follow up Chest CT soon         Multiple medication intolerance/allergies.         Anxiety/Depression Exercise- limited by multiple physical concerns. Using pedaler for 10 minutes 5 times weekly.  Sleep- History of sleep apnea using CPAP  S/P colonoscopy with benign polyp- Reports ongoing GI issues following colonoscopy with constipation. Taking psyllium and miralax with some success.               She is working on finding a therapist for CBT and well as following up with psychiatry.  Has Yorkie- Sadie Mae-thinking of making her an emotional support animal.   Having difficulty focusing on nutrition plan at this point, but wants to try and get back on track with nutrition plan.  Last RMR  1699 10/29/2021 and discussed trying Cat 2, but reports was too hungry going down to Cat 2. Discussed starting on Cat 3 and will monitor closely. May want to repeat IC as well.   Discussed results of labs today.   Pharmacotherapy: None for weight loss.   PHYSICAL EXAM:  Blood pressure 124/83, pulse 60, temperature 97.9 F (36.6 C), height 5\' 2"  (1.575 m), weight 205 lb (93 kg), SpO2 99 %. Body mass index is 37.49 kg/m.  General: She is overweight, cooperative, alert, well developed, and in no acute distress. PSYCH: Has normal mood, affect and thought process.   Cardiovascular: HR 60's regular Lungs: Normal breathing effort, no conversational dyspnea.  DIAGNOSTIC DATA REVIEWED:  BMET    Component Value Date/Time   NA 140 07/30/2022 1140   NA 136 10/06/2012 0434   K 4.6 07/30/2022 1140   K 3.6 10/06/2012 0434   CL 101 07/30/2022 1140   CL 104 10/06/2012 0434   CO2 23 07/30/2022 1140   CO2 26 10/06/2012 0434   GLUCOSE 100 (H) 07/30/2022 1140   GLUCOSE 92 03/03/2021 1412   GLUCOSE 114 (H) 10/06/2012 0434   BUN 21 07/30/2022 1140   BUN 7 10/06/2012 0434   CREATININE 0.67 07/30/2022 1140   CREATININE 0.73 08/09/2015 1609   CALCIUM 9.8 07/30/2022  1140   CALCIUM 9.3 10/06/2012 0434   GFRNONAA >60 10/07/2020 1506   GFRNONAA >60 10/06/2012 0434   GFRAA 107 05/07/2020 1302   GFRAA >60 10/06/2012 0434   Lab Results  Component Value Date   HGBA1C 5.7 (H) 07/30/2022   HGBA1C 5.4 10/02/2014   Lab Results  Component Value Date   INSULIN 7.9 07/30/2022   INSULIN 26.1 (H) 06/01/2019   Lab Results  Component Value Date   TSH 1.460 07/30/2022   CBC    Component Value Date/Time   WBC 7.5 10/07/2020 1506   RBC 5.28 (H) 10/07/2020 1506   HGB 15.4 (H) 10/07/2020 1506   HGB 14.5 06/01/2019 1235   HCT 45.5 10/07/2020 1506   HCT 43.1 06/01/2019 1235   PLT 152 10/07/2020 1506   PLT 175 06/01/2019 1235   MCV 86.2 10/07/2020 1506   MCV 86 06/01/2019 1235   MCV 86 10/06/2012  0434   MCH 29.2 10/07/2020 1506   MCHC 33.8 10/07/2020 1506   RDW 13.3 10/07/2020 1506   RDW 13.6 06/01/2019 1235   RDW 13.8 10/06/2012 0434   Iron Studies    Component Value Date/Time   IRON 77 04/21/2021 1130   TIBC 283 04/21/2021 1130   FERRITIN 336 (H) 04/21/2021 1133   IRONPCTSAT 27 04/21/2021 1130   Lipid Panel     Component Value Date/Time   CHOL 238 (H) 07/30/2022 1140   TRIG 160 (H) 07/30/2022 1140   HDL 33 (L) 07/30/2022 1140   CHOLHDL 7.2 (H) 07/30/2022 1140   CHOLHDL 8 05/02/2018 1457   VLDL 72.0 (H) 05/02/2018 1457   LDLCALC 175 (H) 07/30/2022 1140   LDLDIRECT 141.0 05/02/2018 1457   Hepatic Function Panel     Component Value Date/Time   PROT 6.6 07/30/2022 1140   PROT 7.3 10/06/2012 0434   ALBUMIN 4.8 07/30/2022 1140   ALBUMIN 4.2 10/06/2012 0434   AST 27 07/30/2022 1140   AST 17 10/06/2012 0434   ALT 39 (H) 07/30/2022 1140   ALT 27 10/06/2012 0434   ALKPHOS 84 07/30/2022 1140   ALKPHOS 64 10/06/2012 0434   BILITOT 0.4 07/30/2022 1140   BILITOT 0.6 10/06/2012 0434   BILIDIR 0.1 03/24/2021 1502      Component Value Date/Time   TSH 1.460 07/30/2022 1140   TSH 1.48 03/03/2021 1412   Nutritional Lab Results  Component Value Date   VD25OH 44.0 07/30/2022   VD25OH 48.7 06/09/2021   VD25OH 58.9 12/04/2020    ASSOCIATED CONDITIONS ADDRESSED TODAY  ASSESSMENT AND PLAN  Problem List Items Addressed This Visit     Generalized obesity   HLD (hyperlipidemia)    Hyperlipidemia LDL is not at goal. Medication(s): Reports she is going to start Leqvio soon.  Has had poor tolerance to medications as noted.  Cardiovascular risk factors: dyslipidemia, obesity (BMI >= 30 kg/m2), and sedentary lifestyle  Lab Results  Component Value Date   CHOL 238 (H) 07/30/2022   HDL 33 (L) 07/30/2022   LDLCALC 175 (H) 07/30/2022   LDLDIRECT 141.0 05/02/2018   TRIG 160 (H) 07/30/2022   CHOLHDL 7.2 (H) 07/30/2022   CHOLHDL 7.4 (H) 06/09/2021   CHOLHDL 5.1 (H)  12/04/2020   Lab Results  Component Value Date   ALT 39 (H) 07/30/2022   AST 27 07/30/2022   ALKPHOS 84 07/30/2022   BILITOT 0.4 07/30/2022  The 10-year ASCVD risk score (Arnett DK, et al., 2019) is: 4.9%   Values used to calculate the score:  Age: 57 years     Sex: Female     Is Non-Hispanic African American: No     Diabetic: No     Tobacco smoker: No     Systolic Blood Pressure: 124 mmHg     Is BP treated: Yes     HDL Cholesterol: 33 mg/dL     Total Cholesterol: 238 mg/dL  Plan: Continue medications as directed by Cardiology- Leqvio Continue working on nutrition plan to promote weight loss and improve lipid profile/decrease CV risk.           Prediabetes - Primary    Prediabetes Last A1c was 5.7/ Insulin 7.9- not at goals.   Medication(s): None She is working on nutrition plan to decrease simple carbohydrates, increase lean proteins and exercise to promote weight loss, improve glycemic control and prevent progression to Type 2 diabetes.   Lab Results  Component Value Date   HGBA1C 5.7 (H) 07/30/2022   HGBA1C 5.5 02/02/2022   HGBA1C 5.3 06/09/2021   HGBA1C 5.5 12/04/2020   HGBA1C 5.5 08/08/2020   Lab Results  Component Value Date   INSULIN 7.9 07/30/2022   INSULIN 16.0 02/02/2022   INSULIN 8.2 06/09/2021   INSULIN 10.4 12/04/2020   INSULIN 10.7 08/08/2020   Plan:  Continue working on nutrition plan to decrease simple carbohydrates, increase lean proteins and exercise to promote weight loss, improve glycemic control and prevent progression to Type 2 diabetes.         Other Specified Feeding or Eating Disorder, Emotional Eating Behaviors    Other depression/emotional eating Jeorgia has had issues with stress/emotional eating. Currently this is poorly controlled. Overall mood is stable. Medication(s): Other: zoloft 150 mg in am and 25 mg in evening and Tranxene 7.5 mg 3 times daily per psychiatry.  She is working with Dr. Dewaine Conger to address emotional  eating/negative feeling around food.  Plan: Continue medications per psychiatry.  Follow up with Dr. Dewaine Conger as directed.           BMI 37.0-37.9, adult Current BMI 37.5   Current BMI 37.5 TBW loss 5.5%    TREATMENT PLAN FOR OBESITY:  Recommended Dietary Goals  Hadia is currently in the action stage of change. As such, her goal is to continue weight management plan. She has agreed to the Category 3 Plan.  Behavioral Intervention  We discussed the following Behavioral Modification Strategies today: increasing lean protein intake, decreasing simple carbohydrates , increasing vegetables, increasing lower glycemic fruits, increasing water intake, continue to practice mindfulness when eating, and planning for success.  Additional resources provided today: NA  Recommended Physical Activity Goals  Nona has been advised to work up to 150 minutes of moderate intensity aerobic activity a week and strengthening exercises 2-3 times per week for cardiovascular health, weight loss maintenance and preservation of muscle mass.   She has agreed to Continue current level of physical activity    Pharmacotherapy We discussed various medication options to help Rylea with her weight loss efforts and we both agreed to continue to work on nutritional and behavioral strategies to promote weight loss.  .    Return in about 4 weeks (around 10/12/2022).Marland Kitchen She was informed of the importance of frequent follow up visits to maximize her success with intensive lifestyle modifications for her multiple health conditions.   ATTESTASTION STATEMENTS:  Reviewed by clinician on day of visit: allergies, medications, problem list, medical history, surgical history, family history, social history, and previous encounter notes.   I have personally spent  45 minutes total time today in preparation, patient care, nutritional counseling and documentation for this visit, including the following: review of clinical lab  tests; review of medical tests/procedures/services.      Shatiqua Heroux, PA-C

## 2022-09-15 ENCOUNTER — Telehealth (INDEPENDENT_AMBULATORY_CARE_PROVIDER_SITE_OTHER): Payer: Self-pay | Admitting: Psychology

## 2022-09-15 DIAGNOSIS — F5089 Other specified eating disorder: Secondary | ICD-10-CM | POA: Insufficient documentation

## 2022-09-15 DIAGNOSIS — Z6837 Body mass index (BMI) 37.0-37.9, adult: Secondary | ICD-10-CM | POA: Insufficient documentation

## 2022-09-15 NOTE — Telephone Encounter (Signed)
  Office: [phone]  /  Fax: [phone]  Date of Call: September 15, 2022  Time of Call: 3:39pm Duration of Call: 13 minute(s) Provider: Lawerance Cruel, PsyD  CONTENT: This provider called [name] as she called the clinic expressing concern regarding billing. She explained what she was told by her insurance and [place]'s billing department. Concerns were validated. Billing codes were explained and she was encouraged to contact [employer] again. No evidence or endorsement of any safety concerns. All questions/concerns addressed.   PLAN:  Brittney Ruiz is scheduled for an appointment on 09/22/2022 at 4pm via MyChart Video Visit.

## 2022-09-15 NOTE — Assessment & Plan Note (Signed)
Prediabetes Last A1c was 5.7/ Insulin 7.9- not at goals.   Medication(s): None She is working on nutrition plan to decrease simple carbohydrates, increase lean proteins and exercise to promote weight loss, improve glycemic control and prevent progression to Type 2 diabetes.   Lab Results  Component Value Date   HGBA1C 5.7 (H) 07/30/2022   HGBA1C 5.5 02/02/2022   HGBA1C 5.3 06/09/2021   HGBA1C 5.5 12/04/2020   HGBA1C 5.5 08/08/2020   Lab Results  Component Value Date   INSULIN 7.9 07/30/2022   INSULIN 16.0 02/02/2022   INSULIN 8.2 06/09/2021   INSULIN 10.4 12/04/2020   INSULIN 10.7 08/08/2020    Plan:  Continue working on nutrition plan to decrease simple carbohydrates, increase lean proteins and exercise to promote weight loss, improve glycemic control and prevent progression to Type 2 diabetes.

## 2022-09-15 NOTE — Assessment & Plan Note (Signed)
Hyperlipidemia LDL is not at goal. Medication(s): Reports she is going to start Leqvio soon.  Has had poor tolerance to medications as noted.  Cardiovascular risk factors: dyslipidemia, obesity (BMI >= 30 kg/m2), and sedentary lifestyle  Lab Results  Component Value Date   CHOL 238 (H) 07/30/2022   HDL 33 (L) 07/30/2022   LDLCALC 175 (H) 07/30/2022   LDLDIRECT 141.0 05/02/2018   TRIG 160 (H) 07/30/2022   CHOLHDL 7.2 (H) 07/30/2022   CHOLHDL 7.4 (H) 06/09/2021   CHOLHDL 5.1 (H) 12/04/2020   Lab Results  Component Value Date   ALT 39 (H) 07/30/2022   AST 27 07/30/2022   ALKPHOS 84 07/30/2022   BILITOT 0.4 07/30/2022   The 10-year ASCVD risk score (Arnett DK, et al., 2019) is: 4.9%   Values used to calculate the score:     Age: 57 years     Sex: Female     Is Non-Hispanic African American: No     Diabetic: No     Tobacco smoker: No     Systolic Blood Pressure: 124 mmHg     Is BP treated: Yes     HDL Cholesterol: 33 mg/dL     Total Cholesterol: 238 mg/dL  Plan: Continue medications as directed by Cardiology- Leqvio Continue working on nutrition plan to promote weight loss and improve lipid profile/decrease CV risk.

## 2022-09-15 NOTE — Assessment & Plan Note (Signed)
Other depression/emotional eating Brittney Ruiz has had issues with stress/emotional eating. Currently this is poorly controlled. Overall mood is stable. Medication(s): Other: zoloft 150 mg in am and 25 mg in evening and Tranxene 7.5 mg 3 times daily per psychiatry.  She is working with Dr. Dewaine Conger to address emotional eating/negative feeling around food.  Plan: Continue medications per psychiatry.  Follow up with Dr. Dewaine Conger as directed.

## 2022-09-16 ENCOUNTER — Telehealth: Payer: Self-pay | Admitting: *Deleted

## 2022-09-16 NOTE — Telephone Encounter (Signed)
Patient called in and left VM regarding bill for $104 she received for her lung screening CT done recently. Attempted to contact pt but had to leave a voicemail for pt to call back.

## 2022-09-16 NOTE — Telephone Encounter (Addendum)
Patient has been identified as candidate for Leqvio  Benefits investigation enrollment completed on 09/10/22  Benefits investigation report notes the following:  Type of insurance: [employer] Plus  OOP Max: $5500 / Met: $342.64  Deductible: NONE  Co-insurance: 20%  PA required: YES  PA phone number: not provided  Benefits Summary Details:  [employer] Value Plus HMO - Leqvio is covered at 80% until patient reaches $5500 OOP maximum. Once this is met, Brittney Ruiz is covered at 100%. As of 09/10/22, patient has met $5157.36 towards OOP max

## 2022-09-16 NOTE — Telephone Encounter (Signed)
Spoke with pt and advised that the bill she received may have been for a radiology reading fee. Pt stated that she did not receive a bill last year and her insurance was the same. I advised pt to call her insurance company to ask them what the specific charge was for and ask them to send the bill back through to be looked at again. Pt verbalized understanding.

## 2022-09-18 DIAGNOSIS — M47816 Spondylosis without myelopathy or radiculopathy, lumbar region: Secondary | ICD-10-CM | POA: Diagnosis not present

## 2022-09-22 ENCOUNTER — Telehealth (INDEPENDENT_AMBULATORY_CARE_PROVIDER_SITE_OTHER): Payer: Medicare HMO | Admitting: Psychology

## 2022-09-22 ENCOUNTER — Encounter (INDEPENDENT_AMBULATORY_CARE_PROVIDER_SITE_OTHER): Payer: Self-pay

## 2022-09-22 ENCOUNTER — Telehealth: Payer: Self-pay | Admitting: Internal Medicine

## 2022-09-22 NOTE — Telephone Encounter (Signed)
Patient is calling stating she just spoke with [employer] who advised they need Dr. Rennis Golden to fill out pg 3 on "[email or web address]" for the patients Leqvio. She is requesting a callback to notify her once his portion has been completed. Please advise.

## 2022-09-23 ENCOUNTER — Encounter: Payer: Self-pay | Admitting: Internal Medicine

## 2022-09-23 NOTE — Telephone Encounter (Signed)
Message sent to patient via MyChart Referral to [facility] ordered Generally, [name] processes PAP for [facility]

## 2022-09-23 NOTE — Addendum Note (Signed)
Addended by: Lindell Spar on: 09/23/2022 08:07 AM   Modules accepted: Orders

## 2022-09-23 NOTE — Telephone Encounter (Signed)
Brittney Ruiz, I will begin the auth process with her insurance.  If patient is approved I will inform [name] to enroll her in the [facility] foundation to assist with financial cost.

## 2022-10-01 ENCOUNTER — Telehealth: Payer: Self-pay | Admitting: Pharmacy Technician

## 2022-10-01 NOTE — Telephone Encounter (Addendum)
Brittney Ruiz note;  Auth Submission: APPROVED Site of care: Site of care: [facility] Payer: [employer] MEDICARE Medication & CPT/J Code(s) submitted: Leqvio (Inclisiran) [id number] Route of submission (phone, fax, portal):  Phone # Fax # Auth type: Buy/Bill Units/visits requested: 2 Reference number: [id number] Approval from: 09/30/22 to 04/02/23   Patient has been approved for NPAF (Free drug) Excell sheet has been updated. FYI flag added.

## 2022-10-08 ENCOUNTER — Ambulatory Visit: Payer: Medicare HMO

## 2022-10-13 ENCOUNTER — Telehealth (INDEPENDENT_AMBULATORY_CARE_PROVIDER_SITE_OTHER): Payer: Self-pay | Admitting: Psychology

## 2022-10-13 DIAGNOSIS — R531 Weakness: Secondary | ICD-10-CM | POA: Diagnosis not present

## 2022-10-13 DIAGNOSIS — M5136 Other intervertebral disc degeneration, lumbar region: Secondary | ICD-10-CM | POA: Diagnosis not present

## 2022-10-13 NOTE — Telephone Encounter (Signed)
  Office: [phone]  /  Fax: [phone]  Date of Call: Oct 13, 2022  Time of Call: 4:53pm Duration of Call: 22 minute(s) Provider: Lawerance Cruel, PsyD  CONTENT: This provider called Brittney Ruiz to check-in as she sent a MyChart message regarding her insurance coverage for appointments with this provider. Associated thoughts and feelings were validated. Brittney Ruiz provided verbal consent for this provider to consult with [facility]'s director [facility]). It was also recommended that she contact her insurance. A brief risk assessment was completed. She denied experiencing suicidal, self-harm, and homicidal ideation, plan, and intent. All questions/concerns addressed.   PLAN: This provider will consult with Mr. Philbert Riser.

## 2022-10-14 ENCOUNTER — Encounter (INDEPENDENT_AMBULATORY_CARE_PROVIDER_SITE_OTHER): Payer: Self-pay | Admitting: Physician Assistant

## 2022-10-14 ENCOUNTER — Telehealth (INDEPENDENT_AMBULATORY_CARE_PROVIDER_SITE_OTHER): Payer: Medicare HMO | Admitting: Physician Assistant

## 2022-10-14 DIAGNOSIS — E785 Hyperlipidemia, unspecified: Secondary | ICD-10-CM | POA: Diagnosis not present

## 2022-10-14 DIAGNOSIS — E669 Obesity, unspecified: Secondary | ICD-10-CM

## 2022-10-14 DIAGNOSIS — F5089 Other specified eating disorder: Secondary | ICD-10-CM | POA: Diagnosis not present

## 2022-10-14 DIAGNOSIS — E782 Mixed hyperlipidemia: Secondary | ICD-10-CM

## 2022-10-14 DIAGNOSIS — R7303 Prediabetes: Secondary | ICD-10-CM

## 2022-10-14 DIAGNOSIS — Z6837 Body mass index (BMI) 37.0-37.9, adult: Secondary | ICD-10-CM | POA: Diagnosis not present

## 2022-10-14 NOTE — Progress Notes (Signed)
TeleHealth Visit:  This visit was completed with telemedicine (audio/video) technology. Davona has verbally consented to this TeleHealth visit. The patient is located at home, the provider is located at the office of [facility]. The participants in this visit include the listed provider and patient. The visit was conducted today via MyChart video.  OBESITY Brittney Ruiz is here to discuss her progress with her obesity treatment plan along with follow-up of her obesity related diagnoses.   Today's visit was # 39 Starting weight: 217 lbs Starting date: June 01, 2019 Weight at last in office visit: 205 lbs on 09/14/2022 Total weight loss: 12 lbs at last in office visit on 09/14/2022 . Today's reported weight (203 pounds): 203 pounds  Nutrition Plan: the Category 3 plan -70% adherence.  Current exercise:  Physical therapy 60 minutes 2 times weekly and working on upper body strengthening 10 minutes 2 times a week.  Interim History:  She continues to struggle with multiple medical issues.  She is going to start Leqvio infusions on Friday for her hypercholesterolemia. She has follow-up next Monday for possible ablation procedure for her ongoing back issues. She reports decreased hunger and appetite and reports feeling like she has to force herself to eat.  She is trying to be more mindful with her protein intake and to not skip meals.  But she feels like all the stress from her multiple concerns are making this difficult.  She returned from a trip to [place] and reports she tried to stay on track as much as possible with this.  She is struggling with multiple emotional concerns and has had the same psychiatrist for long-term medical management of these issues, but may pursue some counseling through behavioral health but is concerned about the insurance coverage for counseling sessions.  Feel like this would be very beneficial for the patient to have ongoing counseling and she is going to continue  to try to work things out through her insurance provider.  Eating all of the prescribed protein: yes -she is focusing well on protein intake and is trying to get adequate protein at each meal Skipping meals: No Drinking adequate water: Most of the time Drinking sugar sweetened beverages: No Hunger controlled: Reports some decreased hunger signals but is not skipping meals. Cravings controlled:  moderately controlled.  Meeting protein goals: Most of the time Meeting calorie goals: Most of the time  Pharmacotherapy: Devera is on none for weight loss  Assessment/Plan:  1. Prediabetes Last A1c was 5.7/insulin 7.9-slightly above goal Medication(s): None Polyphagia:No She is working on nutrition plan to decrease simple carbohydrates, increase lean proteins and exercise to promote weight loss, improve glycemic control and prevent progression to Type 2 diabetes.   Lab Results  Component Value Date   HGBA1C 5.7 (H) 07/30/2022   HGBA1C 5.5 02/02/2022   HGBA1C 5.3 06/09/2021   HGBA1C 5.5 12/04/2020   HGBA1C 5.5 08/08/2020   Lab Results  Component Value Date   INSULIN 7.9 07/30/2022   INSULIN 16.0 02/02/2022   INSULIN 8.2 06/09/2021   INSULIN 10.4 12/04/2020   INSULIN 10.7 08/08/2020    Plan:  Continue working on nutrition plan to decrease simple carbohydrates, increase lean proteins and exercise to promote weight loss, improve glycemic control and prevent progression to Type 2 diabetes.     2. Hyperlipidemia LDL is not at goal. Medication(s): Leqvio infusion to start later this week.  Cardiovascular risk factors: dyslipidemia, obesity (BMI >= 30 kg/m2), and sedentary lifestyle  Lab Results  Component Value Date  CHOL 238 (H) 07/30/2022   HDL 33 (L) 07/30/2022   LDLCALC 175 (H) 07/30/2022   LDLDIRECT 141.0 05/02/2018   TRIG 160 (H) 07/30/2022   CHOLHDL 7.2 (H) 07/30/2022   CHOLHDL 7.4 (H) 06/09/2021   CHOLHDL 5.1 (H) 12/04/2020   Lab Results  Component Value Date    ALT 39 (H) 07/30/2022   AST 27 07/30/2022   ALKPHOS 84 07/30/2022   BILITOT 0.4 07/30/2022   The 10-year ASCVD risk score (Arnett DK, et al., 2019) is: 4.3%   Values used to calculate the score:     Age: 44 years     Sex: Female     Is Non-Hispanic African American: No     Diabetic: No     Tobacco smoker: No     Systolic Blood Pressure: 117 mmHg     Is BP treated: Yes     HDL Cholesterol: 33 mg/dL     Total Cholesterol: 238 mg/dL  Plan: She is to start Leqvio infusions later this week for management of her hyperlipidemia. She is followed by Dr. Rennis Golden, [facility] cardiology.  She does have a family history of heart disease and multiple other risk factors.  She had a CT coronary angiogram in 2020 which did show no evidence of coronary artery disease and a 0 coronary calcium but remains at high risk given her dyslipidemia and multiple other risk factors including significant limitations to exercise.  She has not tolerated Zetia and developed significant side effects with statin therapy causing her to discontinue it.  Will continue to monitor with cardiology Continue to work on nutrition plan -decreasing simple carbohydrates, increasing lean proteins, decreasing saturated fats and cholesterol , avoiding trans fats and exercise as able to promote weight loss, improve lipids and decrease cardiovascular risks.  3. Other depression/emotional eating Yovanna has had issues with stress/emotional eating. Currently this is moderately controlled. Overall mood is stable. Medication(s): Other: Zoloft 150 mg every morning and 25 mg in the evening as well as Tranxene 7.5 mg 3 times daily per psychiatry.  Plan: She will continue her medications per psychiatry.  She has followed with the same psychiatrist for many years, but would likely benefit from ongoing counseling for multiple stressors which do at expense and we will continue to work through this with her insurance provider.  The ute to her comfort/emotional  eating but are also tied to multiple other issues.  We feel that ongoing counseling through behavioral health would be beneficial to the patient but she is concerned about   Generalized Obesity: Current BMI 37.49 last visit She will continue to work on her nutrition plan to decrease simple carbohydrates, increase lean protein, decrease saturated fat and cholesterol and exercise as she can tolerate to promote weight loss.   Ivelis is currently in the action stage of change. As such, her goal is to continue with weight loss efforts.  She has agreed to the Category 3 plan.  Exercise goals:  She should continue her physical therapy as directed and work on upper body strengthening as she is able to given her significant limitations with activity level due to ongoing back pain.  Behavioral modification strategies: increasing lean protein intake, decreasing simple carbohydrates , no meal skipping, increase water intake, planning for success, increasing vegetables, increasing fiber rich foods, emotional eating strategies, avoiding temptations, and keep healthy foods in the home.  Farrah has agreed to follow-up with our clinic in 4 weeks.  No orders of the defined types were placed in  this encounter.   There are no discontinued medications.   No orders of the defined types were placed in this encounter.     Objective:   VITALS: Per patient if applicable, see vitals. GENERAL: Alert and in no acute distress. CARDIOPULMONARY: No increased WOB. Speaking in clear sentences.  PSYCH: Pleasant and cooperative. Speech normal rate and rhythm. Affect is appropriate. Insight and judgement are appropriate. Attention is focused, linear, and appropriate.  NEURO: Oriented as arrived to appointment on time with no prompting.   Attestation Statements:   Reviewed by clinician on day of visit: allergies, medications, problem list, medical history, surgical history, family history, social history, and previous  encounter notes.   Time spent on visit including the items listed below was 31 minutes.  -preparing to see the patient (e.g., review of tests, history, previous notes) -obtaining and/or reviewing separately obtained history -counseling and educating the patient/family/caregiver -documenting clinical information in the electronic or other health record -ordering medications, tests, or procedures -independently interpreting results and communicating results to the patient/ family/caregiver -referring and communicating with other health care professionals  -care coordination   This was prepared with the assistance of Engineer, civil (consulting).  Occasional wrong-word or sound-a-like substitutions may have occurred due to the inherent limitations of voice recognition software.  Levaughn Puccinelli,PA-C

## 2022-10-15 ENCOUNTER — Telehealth: Payer: Self-pay | Admitting: Internal Medicine

## 2022-10-15 NOTE — Telephone Encounter (Signed)
Patient is calling regarding the mychart messages between her and RN Eileen Stanford.   She is very concerned with coverage due to the injection being scheduled for tomorrow afternoon.  Advised patient we are checking on it per Jenna's documentation and will contact her with an update ASAP.  Please advise.

## 2022-10-16 ENCOUNTER — Ambulatory Visit (INDEPENDENT_AMBULATORY_CARE_PROVIDER_SITE_OTHER): Payer: Medicare HMO

## 2022-10-16 VITALS — BP 116/81 | HR 66 | Temp 97.4°F | Resp 18 | Ht 62.0 in | Wt 206.0 lb

## 2022-10-16 DIAGNOSIS — E782 Mixed hyperlipidemia: Secondary | ICD-10-CM | POA: Diagnosis not present

## 2022-10-16 MED ORDER — INCLISIRAN SODIUM 284 MG/1.5ML ~~LOC~~ SOSY
284.0000 mg | PREFILLED_SYRINGE | Freq: Once | SUBCUTANEOUS | Status: AC
  Administered 2022-10-16: 284 mg via SUBCUTANEOUS
  Filled 2022-10-16: qty 1.5

## 2022-10-16 NOTE — Progress Notes (Signed)
Diagnosis: Hyperlipidemia  Provider:  Chilton Greathouse MD  Procedure: Injection  Leqvio (inclisiran), Dose: 284 mg, Site: subcutaneous, Number of injections: 1  Post Care:  30 minutesObservation period completed,pt c/o slight frontal headache.Denies any other symptoms.V/S obtained and WNL.Pt waited another 15 minutes without any progression. Pt advised to seek medical attention if the symptoms persist or progress.Pt verbalized understanding.  Discharge: Condition: Good, Destination: Home . AVS Provided  Performed by:  Garnette Czech, RN

## 2022-10-19 DIAGNOSIS — M47816 Spondylosis without myelopathy or radiculopathy, lumbar region: Secondary | ICD-10-CM | POA: Diagnosis not present

## 2022-10-19 NOTE — Telephone Encounter (Signed)
Per phone call with Brittney Ruiz w/infusion center, patient will be referred to [facility] for enrollment in patient assitance. She has a 20% co-insurance and has not met OOP max, which was incorrectly relayed by [facility] on BIV

## 2022-10-20 ENCOUNTER — Telehealth (INDEPENDENT_AMBULATORY_CARE_PROVIDER_SITE_OTHER): Payer: Self-pay | Admitting: Psychology

## 2022-10-20 ENCOUNTER — Encounter (INDEPENDENT_AMBULATORY_CARE_PROVIDER_SITE_OTHER): Payer: Medicare HMO | Admitting: Psychology

## 2022-10-20 NOTE — Progress Notes (Signed)
Entered in error

## 2022-10-20 NOTE — Telephone Encounter (Signed)
  Office: [phone]  /  Fax: [phone]  Date of Call: October 20, 2022  Time of Call: 2:58pm Duration of Call: 22 minute(s) Provider: Lawerance Cruel, PsyD  CONTENT:  This provider called Brittney Ruiz to check-in as front desk Brittney Ruiz) informed this provider that she wanted to speak with this provider. Brittney Ruiz reported she canceled the appointment with this provider earlier via MyChart and she reportedly called the clinic today. She stated her social worker had her apply for "individual Medicaid" to cover behavioral health visits, adding she continues to try to apply for scholarships to receive financial support for appointments as well. Thoughts and feelings were validated. A brief risk assessment was completed. Brittney Ruiz denied experiencing suicidal, self-harm and homicidal ideation, plan, or intent since the last appointment with this provider. All questions/concerns addressed.   PLAN: Brittney Ruiz will be initiating therapeutic services with [facility] on Thursday. No further follow-up planned by this provider.

## 2022-10-20 NOTE — Telephone Encounter (Signed)
  Office: [phone]  /  Fax: [phone]  Date of Call: October 20, 2022  Time of Call: 2:02pm Provider: Lawerance Cruel, PsyD  CONTENT: This provider called [name] to check-in as she did not present for today's MyChart Video Visit appointment at 2pm. A HIPAA compliant voicemail was left requesting a call back. Of note, this provider stayed on the MyChart Video Visit appointment for 5 minutes prior to signing off per the clinic's grace period policy.    PLAN: This provider will wait for Zya to call back. No further follow-up planned by this provider.

## 2022-10-20 NOTE — Telephone Encounter (Signed)
  Office: [phone]  /  Fax: [phone]  Date of Call: October 20, 2022  Time of Call: 12:39pm Provider: Lawerance Cruel, PsyD  CONTENT: This provider called Estela to check-in and provide an update regarding information obtained about insurance-related concerns. A HIPAA compliant voicemail was left requesting a call back.   PLAN: Karysa is scheduled for an appointment today at 2pm via MyChart Video Visit.

## 2022-10-22 ENCOUNTER — Ambulatory Visit (INDEPENDENT_AMBULATORY_CARE_PROVIDER_SITE_OTHER): Payer: Medicare HMO | Admitting: Clinical

## 2022-10-22 DIAGNOSIS — F411 Generalized anxiety disorder: Secondary | ICD-10-CM

## 2022-10-22 DIAGNOSIS — F331 Major depressive disorder, recurrent, moderate: Secondary | ICD-10-CM

## 2022-10-22 NOTE — Progress Notes (Signed)
                Zoha Spranger, LCSW 
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
## 2022-10-23 DIAGNOSIS — M5136 Other intervertebral disc degeneration, lumbar region: Secondary | ICD-10-CM | POA: Diagnosis not present

## 2022-10-27 DIAGNOSIS — R531 Weakness: Secondary | ICD-10-CM | POA: Diagnosis not present

## 2022-10-27 DIAGNOSIS — M5136 Other intervertebral disc degeneration, lumbar region: Secondary | ICD-10-CM | POA: Diagnosis not present

## 2022-10-29 ENCOUNTER — Ambulatory Visit
Admission: RE | Admit: 2022-10-29 | Discharge: 2022-10-29 | Disposition: A | Discharge status: Home / Self Care | Payer: Medicare HMO | Source: Ambulatory Visit | Attending: Acute Care | Admitting: Acute Care

## 2022-10-29 DIAGNOSIS — Z87891 Personal history of nicotine dependence: Secondary | ICD-10-CM | POA: Diagnosis not present

## 2022-10-29 DIAGNOSIS — R911 Solitary pulmonary nodule: Secondary | ICD-10-CM | POA: Diagnosis not present

## 2022-10-30 DIAGNOSIS — M5136 Other intervertebral disc degeneration, lumbar region: Secondary | ICD-10-CM | POA: Diagnosis not present

## 2022-10-30 DIAGNOSIS — R531 Weakness: Secondary | ICD-10-CM | POA: Diagnosis not present

## 2022-11-02 ENCOUNTER — Ambulatory Visit (INDEPENDENT_AMBULATORY_CARE_PROVIDER_SITE_OTHER): Payer: Medicare HMO | Admitting: Physician Assistant

## 2022-11-02 ENCOUNTER — Ambulatory Visit (INDEPENDENT_AMBULATORY_CARE_PROVIDER_SITE_OTHER): Payer: Medicare HMO | Admitting: Clinical

## 2022-11-02 DIAGNOSIS — F331 Major depressive disorder, recurrent, moderate: Secondary | ICD-10-CM | POA: Diagnosis not present

## 2022-11-02 DIAGNOSIS — F509 Eating disorder, unspecified: Secondary | ICD-10-CM

## 2022-11-02 DIAGNOSIS — F411 Generalized anxiety disorder: Secondary | ICD-10-CM | POA: Diagnosis not present
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
## 2022-11-03 DIAGNOSIS — M5416 Radiculopathy, lumbar region: Secondary | ICD-10-CM | POA: Diagnosis not present

## 2022-11-03 DIAGNOSIS — R531 Weakness: Secondary | ICD-10-CM | POA: Diagnosis not present

## 2022-11-03 DIAGNOSIS — M5136 Other intervertebral disc degeneration, lumbar region: Secondary | ICD-10-CM | POA: Diagnosis not present

## 2022-11-03 DIAGNOSIS — M47816 Spondylosis without myelopathy or radiculopathy, lumbar region: Secondary | ICD-10-CM | POA: Diagnosis not present

## 2022-11-06 ENCOUNTER — Ambulatory Visit: Payer: Medicare HMO | Admitting: Internal Medicine

## 2022-11-06 ENCOUNTER — Telehealth: Payer: Self-pay | Admitting: Internal Medicine

## 2022-11-06 DIAGNOSIS — M5136 Other intervertebral disc degeneration, lumbar region: Secondary | ICD-10-CM | POA: Diagnosis not present

## 2022-11-06 NOTE — Telephone Encounter (Signed)
Routed to infusion center -- Brittney Ruiz -- for follow up on this

## 2022-11-06 NOTE — Telephone Encounter (Signed)
Patient is calling about patient assistance for Leqvio.  She is wondering if we having heard anything from [facility].  She states she spoke to them and they told her they would cover the medication.

## 2022-11-06 NOTE — Telephone Encounter (Signed)
Patient received letter that medication was covered 100 due to reaching deductible.  She states then another notice stating that it is not covered.  She is seeing if in fact this is or not. Patient states not receiving a bill as yet but wants to know if covered or not because of different letters.  Please call patient

## 2022-11-09 DIAGNOSIS — M5136 Other intervertebral disc degeneration, lumbar region: Secondary | ICD-10-CM | POA: Diagnosis not present

## 2022-11-09 NOTE — Telephone Encounter (Signed)
I have reached out to [name] for an update. I will f/u once I have a response from them.

## 2022-11-11 DIAGNOSIS — M5136 Other intervertebral disc degeneration, lumbar region: Secondary | ICD-10-CM | POA: Diagnosis not present

## 2022-11-13 ENCOUNTER — Ambulatory Visit: Payer: Medicare HMO | Admitting: Clinical

## 2022-11-16 DIAGNOSIS — R531 Weakness: Secondary | ICD-10-CM | POA: Diagnosis not present

## 2022-11-16 DIAGNOSIS — M5136 Other intervertebral disc degeneration, lumbar region: Secondary | ICD-10-CM | POA: Diagnosis not present

## 2022-11-17 ENCOUNTER — Encounter: Payer: Self-pay | Admitting: Internal Medicine

## 2022-11-18 ENCOUNTER — Ambulatory Visit: Payer: Medicare HMO | Admitting: Clinical

## 2022-11-18 DIAGNOSIS — R531 Weakness: Secondary | ICD-10-CM | POA: Diagnosis not present

## 2022-11-18 DIAGNOSIS — M5136 Other intervertebral disc degeneration, lumbar region: Secondary | ICD-10-CM | POA: Diagnosis not present

## 2022-11-24 ENCOUNTER — Telehealth: Payer: Self-pay | Admitting: Pulmonary Disease

## 2022-11-24 DIAGNOSIS — F4001 Agoraphobia with panic disorder: Secondary | ICD-10-CM | POA: Diagnosis not present

## 2022-11-24 DIAGNOSIS — F410 Panic disorder [episodic paroxysmal anxiety] without agoraphobia: Secondary | ICD-10-CM | POA: Diagnosis not present

## 2022-11-24 DIAGNOSIS — M5136 Other intervertebral disc degeneration, lumbar region: Secondary | ICD-10-CM | POA: Diagnosis not present

## 2022-11-24 DIAGNOSIS — F411 Generalized anxiety disorder: Secondary | ICD-10-CM | POA: Diagnosis not present

## 2022-11-24 DIAGNOSIS — R531 Weakness: Secondary | ICD-10-CM | POA: Diagnosis not present

## 2022-11-24 DIAGNOSIS — F39 Unspecified mood [affective] disorder: Secondary | ICD-10-CM | POA: Diagnosis not present

## 2022-11-24 NOTE — Telephone Encounter (Signed)
Pt states in last 24 hrs she has started wheezing and spitting up light yellowish sputum. Says her sinus' are congested wondering if she should still do PFT on 7/11

## 2022-11-24 NOTE — Telephone Encounter (Signed)
Spoke to patient and relayed below message/recommendations. She voiced her understanding.   Synetta Fail, please cancel PFT and reschedule once dates are available.  12/01/2022 appt with Dr. Jayme Cloud canceled per patient request. Patient will reschedule once PFT has been scheduled.

## 2022-11-24 NOTE — Telephone Encounter (Signed)
Called and spoke to patient.  She stated that over past 24hr she has developed sinus pressure, wheezing, slight increased SOB, headache and spitting up yellow sputum.  No recent covid test.  She has not used albuterol HFA.  She is questioning if she should proceed with PFT that is scheduled 11/26/2022.  Dr. Jayme Cloud, please advise. Thanks

## 2022-11-24 NOTE — Telephone Encounter (Signed)
We should postpone the PFT on the 11th, I recommend that she go to urgent care given that she has numerous allergies and she will need to be tested for COVID etc. before treatment can be given.

## 2022-11-25 ENCOUNTER — Telehealth: Payer: Self-pay

## 2022-11-25 ENCOUNTER — Ambulatory Visit (INDEPENDENT_AMBULATORY_CARE_PROVIDER_SITE_OTHER): Payer: Medicare HMO | Admitting: Physician Assistant

## 2022-11-25 NOTE — Telephone Encounter (Signed)
I called patient to reschedule her appointment and she would like to be sure Rennie Plowman, FNP, has the following information:  Patient states she has started having Leqvio injections every six months for her cholesterol.  Patient states she has to go to the infusion center to have the injection.  Patient states Dr. Zoila Shutter is the doctor who ordered the injections.  Patient states her psychiatrist is increasing her Sertraline from 25 MG to 37.5 MG in the evening.  Patient states she has also added magnesium and L-Theanine to patient's medication regimen.  Patient states she has an appointment on 11/27/2022 at 1:30 at [facility] in [place] with Doree Barthel (psychologist).  Patient states she/ will be having a procedure on Monday (11/30/2022) with Dr. Merri Ray - ablation to cut off signal from her lower back to her brain due to severe arthritis.  Patient states she also has a bulging disk and herniated disk (L3 & L4).  Patient states they found a spots on her lung.  Patient states a lot more has been happening, but Rennie Plowman, FNP, should be able to see it in her chart.

## 2022-11-25 NOTE — Telephone Encounter (Signed)
PFT has been CXL until we get appts for 12/2022

## 2022-11-26 ENCOUNTER — Ambulatory Visit: Payer: Medicare HMO | Admitting: Pulmonary Disease

## 2022-11-26 ENCOUNTER — Ambulatory Visit: Payer: Medicare HMO

## 2022-11-26 NOTE — Telephone Encounter (Signed)
Next injection scheduled 01/19/23

## 2022-11-26 NOTE — Telephone Encounter (Signed)
Brittney Ruiz, We are trying to f/u with [name] with obtaining correct insurance info. (There has been discrepancy with her insurance) Once I have a response, I will f/u.

## 2022-11-26 NOTE — Telephone Encounter (Signed)
Brittney Ruiz, Scheduling team will be reaching out to get her scheduled shortly.  Thanks [name]

## 2022-11-27 ENCOUNTER — Ambulatory Visit: Payer: Medicare HMO | Admitting: Clinical

## 2022-11-27 DIAGNOSIS — F411 Generalized anxiety disorder: Secondary | ICD-10-CM

## 2022-11-27 DIAGNOSIS — F509 Eating disorder, unspecified: Secondary | ICD-10-CM | POA: Diagnosis not present

## 2022-11-27 DIAGNOSIS — F331 Major depressive disorder, recurrent, moderate: Secondary | ICD-10-CM

## 2022-11-27 NOTE — Progress Notes (Signed)
                Schneider Warchol, LCSW 

## 2022-11-27 NOTE — Progress Notes (Signed)
[facility] Counselor/Therapist Progress Note  Patient ID: Brittney Ruiz, MRN: [id number],    Date: 11/27/2022  Time Spent: 1:51pm - 2:20pm : 39 minutes   Treatment Type: Individual Therapy  Reported Symptoms: Patient reported feelings of sadness and anxiety  Mental Status Exam: Appearance:  Neat and Well Groomed     Behavior: Appropriate  Motor: Normal  Speech/Language:  Clear and Coherent  Affect: Tearful  Mood: sad  Thought process: normal  Thought content:   WNL  Sensory/Perceptual disturbances:   WNL  Orientation: oriented to person, place, and situation  Attention: Good  Concentration: Good  Memory: WNL  Fund of knowledge:  Good  Insight:   Fair  Judgment:  Good  Impulse Control: Fair   Risk Assessment: Danger to Self:  No Patient denied current suicidal ideation  Self-injurious Behavior: No Danger to Others: No Patient denied current homicidal ideation Duty to Warn:no Physical Aggression / Violence:No  Access to Firearms a concern: No  Gang Involvement:No   Subjective: Patient reported she is currently at a quilting retreat. Patient stated, "I've been really sad" and stated, "I've cried a lot". Patient stated,  "I've been in more tears that not".  Patient stated, "I feel like I've had that" in response to diagnosis of Major Depressive Disorder. Patient reported during a recent appointment with patient's psychiatrist, the psychiatrist suggested patient increased the dosage of sertraline in the afternoons. Patient reported financial concerns related to paying for behavioral health co-pays. Patient reported she was placed on disability due to Generalized Anxiety Disorder and Panic Disorder. Patient stated, "I'm fearful, I'm afraid of things I shouldn't be". Patient stated, "I was an emotional eater for a while but I thought I had a handle on that". Patient reported she is open to participation in therapy with a provider that specializes in treatment of eating  disorders and marriage counseling. Patient reported she waiting for a call from a social worker through [employer] company to discuss possible resources for financial assistance.   Interventions: Clinician conducted session via caregility video from clinician's home office. Patient provided verbal consent to proceed with telehealth session and is aware of limitations of telephone or video visits. Patient participated in session from patient's quilting retreat in [place], [place]. Clinician reviewed diagnoses and treatment recommendations. Provided psycho education related to diagnoses and treatment. Discussed clinician's scope of practice and provided patient with referral information for providers that specialize in eating disorders.   Collaboration of Care: Other discussed consents required for referrals   Diagnosis:Generalized anxiety disorder  Major depressive disorder, recurrent episode, moderate (HCC)  Eating disorder, unspecified type  Plan: Clinician will initiate the appropriate referrals.  Doree Barthel, LCSW

## 2022-11-30 DIAGNOSIS — M47816 Spondylosis without myelopathy or radiculopathy, lumbar region: Secondary | ICD-10-CM | POA: Diagnosis not present

## 2022-11-30 NOTE — Telephone Encounter (Signed)
Fixed in chart already

## 2022-12-01 ENCOUNTER — Ambulatory Visit: Payer: Medicare HMO | Admitting: Pulmonary Disease

## 2022-12-24 DIAGNOSIS — M5136 Other intervertebral disc degeneration, lumbar region: Secondary | ICD-10-CM | POA: Diagnosis not present

## 2022-12-24 DIAGNOSIS — R531 Weakness: Secondary | ICD-10-CM | POA: Diagnosis not present

## 2022-12-28 DIAGNOSIS — R531 Weakness: Secondary | ICD-10-CM | POA: Diagnosis not present

## 2022-12-28 DIAGNOSIS — M5136 Other intervertebral disc degeneration, lumbar region: Secondary | ICD-10-CM | POA: Diagnosis not present

## 2022-12-30 DIAGNOSIS — M5136 Other intervertebral disc degeneration, lumbar region: Secondary | ICD-10-CM | POA: Diagnosis not present

## 2022-12-30 DIAGNOSIS — R531 Weakness: Secondary | ICD-10-CM | POA: Diagnosis not present

## 2023-01-04 DIAGNOSIS — M5136 Other intervertebral disc degeneration, lumbar region: Secondary | ICD-10-CM | POA: Diagnosis not present

## 2023-01-04 DIAGNOSIS — R531 Weakness: Secondary | ICD-10-CM | POA: Diagnosis not present

## 2023-01-06 DIAGNOSIS — R531 Weakness: Secondary | ICD-10-CM | POA: Diagnosis not present

## 2023-01-06 DIAGNOSIS — M5136 Other intervertebral disc degeneration, lumbar region: Secondary | ICD-10-CM | POA: Diagnosis not present

## 2023-01-08 ENCOUNTER — Ambulatory Visit (INDEPENDENT_AMBULATORY_CARE_PROVIDER_SITE_OTHER): Payer: Medicare HMO | Admitting: Clinical

## 2023-01-08 DIAGNOSIS — F411 Generalized anxiety disorder: Secondary | ICD-10-CM | POA: Diagnosis not present

## 2023-01-08 DIAGNOSIS — F509 Eating disorder, unspecified: Secondary | ICD-10-CM | POA: Diagnosis not present

## 2023-01-08 DIAGNOSIS — F331 Major depressive disorder, recurrent, moderate: Secondary | ICD-10-CM

## 2023-01-08 NOTE — Progress Notes (Signed)
                Karen Sharpe, LCSW 

## 2023-01-08 NOTE — Progress Notes (Signed)
[facility] Counselor/Therapist Progress Note  Patient ID: Brittney Ruiz, MRN: [id number],    Date: 01/08/2023  Time Spent: 12:33pm - 1:30pm : 57 minutes   Treatment Type: Individual Therapy  Reported Symptoms: Patient reported depressed mood, loss of interest, and crying at times.   Mental Status Exam: Appearance:  Neat and Well Groomed     Behavior: Appropriate  Motor: Normal  Speech/Language:  Clear and Coherent  Affect: Tearful  Mood: depressed  Thought process: normal  Thought content:   WNL  Sensory/Perceptual disturbances:   WNL  Orientation: oriented to person, place, and situation  Attention: Good  Concentration: Good  Memory: WNL  Fund of knowledge:  Good  Insight:   Fair  Judgment:  Good  Impulse Control: Fair   Risk Assessment: Danger to Self:  No Patient denied current suicidal ideation  Self-injurious Behavior: No Danger to Others: No Patient denied current homicidal ideation Duty to Warn:no Physical Aggression / Violence:No  Access to Firearms a concern: No  Gang Involvement:No   Subjective: Patient reported her psychiatrist recommended patient be evaluated for PTSD. Patient reported she followed up with [facility] but reported [facility] is not in network with patient's insurance provider. Patient reported she has called various organizations over the past 3 weeks to inquire about financial assistance for behavioral health services. Patient reported she discovered she could receive a grant for the cost of mental health services due to patient's history of cancer but reported she may not qualify for the grant due to cancer being in remission. Patient reported financial concerns related to the coverage for mental health services and reported financial concerns are a current stressor. Patient stated, "I'm functioning, I'm getting up but I've had no desire to do nothing". Patient reported she has been crying and stated, "Ive  lost the desire to quilt, to knit". Patient reported she wants to pursue therapy with a provider.    Interventions:  Supportive therapy and problem solving.  Clinician conducted session via caregility video from clinician's home office. Patient provided verbal consent to proceed with telehealth session and is aware of limitations of telephone or video visits. Patient participated in session from patient's home. Patient reported her husband is present in the home and provided consent for husband to be present during today's session. Clinician provided supportive therapy, active listening, and validation as patient discussed financial concerns and difficulty accessing services with a provider that specializes in patient's clinical needs. Discussed clinician's scope of practice and the importance of connecting patient with a provider that can address all of patient's clinical needs. Clinician will initiate a referral to a provide that can address patient's clinical needs.    Collaboration of Care: not required at this time  Diagnosis:Generalized anxiety disorder   Major depressive disorder, recurrent episode, moderate (HCC)   Eating disorder, unspecified type   Plan: Clinician will initiate the appropriate referrals.  Doree Barthel, LCSW

## 2023-01-19 ENCOUNTER — Ambulatory Visit: Payer: Medicare HMO | Admitting: Clinical

## 2023-01-19 ENCOUNTER — Ambulatory Visit (INDEPENDENT_AMBULATORY_CARE_PROVIDER_SITE_OTHER): Payer: Medicare HMO

## 2023-01-19 VITALS — BP 137/95 | HR 65 | Temp 97.8°F | Resp 18 | Ht 62.0 in | Wt 206.0 lb

## 2023-01-19 DIAGNOSIS — E782 Mixed hyperlipidemia: Secondary | ICD-10-CM

## 2023-01-19 MED ORDER — INCLISIRAN SODIUM 284 MG/1.5ML ~~LOC~~ SOSY
284.0000 mg | PREFILLED_SYRINGE | Freq: Once | SUBCUTANEOUS | Status: AC
  Administered 2023-01-19: 284 mg via SUBCUTANEOUS

## 2023-01-19 NOTE — Progress Notes (Signed)
Diagnosis: Hyperlipidemia  Provider:  Chilton Greathouse MD  Procedure: Injection  Leqvio (inclisiran), Dose: 284 mg, Site: subcutaneous, Number of injections: 1  Post Care: Observation period completed  Discharge: Condition: Good, Destination: Home . AVS Declined  Performed by:  Loney Hering, LPN

## 2023-01-20 DIAGNOSIS — M5136 Other intervertebral disc degeneration, lumbar region: Secondary | ICD-10-CM | POA: Diagnosis not present

## 2023-01-20 DIAGNOSIS — R531 Weakness: Secondary | ICD-10-CM | POA: Diagnosis not present

## 2023-01-22 ENCOUNTER — Encounter: Payer: Self-pay | Admitting: Internal Medicine

## 2023-01-22 DIAGNOSIS — R531 Weakness: Secondary | ICD-10-CM | POA: Diagnosis not present

## 2023-01-22 DIAGNOSIS — M5136 Other intervertebral disc degeneration, lumbar region: Secondary | ICD-10-CM | POA: Diagnosis not present

## 2023-01-25 DIAGNOSIS — M5136 Other intervertebral disc degeneration, lumbar region: Secondary | ICD-10-CM | POA: Diagnosis not present

## 2023-01-25 DIAGNOSIS — R531 Weakness: Secondary | ICD-10-CM | POA: Diagnosis not present

## 2023-01-27 ENCOUNTER — Ambulatory Visit (INDEPENDENT_AMBULATORY_CARE_PROVIDER_SITE_OTHER): Payer: Medicare HMO | Admitting: Licensed Clinical Social Worker

## 2023-01-27 DIAGNOSIS — F411 Generalized anxiety disorder: Secondary | ICD-10-CM

## 2023-01-27 DIAGNOSIS — F331 Major depressive disorder, recurrent, moderate: Secondary | ICD-10-CM | POA: Diagnosis not present

## 2023-01-27 NOTE — Progress Notes (Unsigned)
[facility] Counselor/Therapist Progress Note  Patient ID: Brittney Ruiz, MRN: [id number]    Date: 01/27/23  Time Spent: 402  pm - 505 pm : 63 Minutes  Treatment Type: Individual Therapy.  Reported Symptoms: Patient reports symptoms of having an eating disorder, anxiety, depression and chronic medical conditions.  Mental Status Exam: Appearance:  Casual     Behavior: Attention-Seeking  Motor: Normal  Speech/Language:  Clear and Coherent  Affect: Full Range  Mood: labile  Thought process: flight of ideas  Thought content:   Tangential  Sensory/Perceptual disturbances:   WNL  Orientation: oriented to person, place, time/date, situation, day of week, month of year, and year  Attention: Fair  Concentration: Fair  Memory: WNL  Fund of knowledge:  Good  Insight:   Fair  Judgment:  Fair  Impulse Control: Fair   Risk Assessment: Danger to Self:  No Self-injurious Behavior: No Danger to Others: No Duty to Warn:no Physical Aggression / Violence:No  Access to Firearms a concern: No  Gang Involvement:No   Subjective:   Brittney Ruiz participated from office located at [facility] with Clinician present. Brittney Ruiz consented to treatment. Brittney Ruiz presented for her session expressing a range of emotions. Patient shared that she was referred by another therapist for an eating disorder. Clinician advised that she does not treat eating disorders. Patient began to cry and become very loud.  Clinician attempted to calm patient. Patient exhibited a flight of ideas and spoke rapidly. Clinician attempted to redirect patient frequently without success. Patient then became agitated when Clinician mentioned doing an assessment, stating she has had 3 assessments already. Clinician allowed patient time and opportunity to vent her frustration and agreed to reach out to the previous therapist for clarification.  Interventions: Cognitive Behavioral Therapy and  Assertiveness/Communication  Diagnosis: Generalized anxiety disorder Major Depressive Disorder, recurrent, moderate  Plan: Brittney Ruiz  is to use CBT, mindfulness and coping skills to help manage decrease symptoms associated with their diagnosis.   Long-term goal:   Brittney Ruiz will reduce overall level, frequency, and intensity of the feelings of depression and anxiety evidenced by decreased irritability, negative self talk, and helpless feelings from 6 to 7 days/week to 0 to 2 days/week per client report for at least 3 consecutive months.  Short-term goal:  Brittney Ruiz will verbally express understanding of the relationship between feelings of depression, anxiety and their impact on thinking patterns and behaviors. Verbalize an understanding of the role that distorted thinking plays in creating fears, excessive worry, and ruminations.  Phyllis Ginger MSW, LCSW DATE:01/27/2023

## 2023-01-28 ENCOUNTER — Ambulatory Visit: Payer: Medicare HMO | Admitting: Licensed Clinical Social Worker

## 2023-02-03 ENCOUNTER — Ambulatory Visit: Payer: Medicare HMO | Admitting: Licensed Clinical Social Worker

## 2023-02-03 DIAGNOSIS — M5416 Radiculopathy, lumbar region: Secondary | ICD-10-CM | POA: Diagnosis not present

## 2023-02-03 DIAGNOSIS — M47816 Spondylosis without myelopathy or radiculopathy, lumbar region: Secondary | ICD-10-CM | POA: Diagnosis not present

## 2023-02-03 DIAGNOSIS — M5136 Other intervertebral disc degeneration, lumbar region: Secondary | ICD-10-CM | POA: Diagnosis not present

## 2023-02-03 DIAGNOSIS — R531 Weakness: Secondary | ICD-10-CM | POA: Diagnosis not present

## 2023-02-03 DIAGNOSIS — M4310 Spondylolisthesis, site unspecified: Secondary | ICD-10-CM | POA: Diagnosis not present

## 2023-02-03 DIAGNOSIS — M5380 Other specified dorsopathies, site unspecified: Secondary | ICD-10-CM | POA: Diagnosis not present

## 2023-02-04 ENCOUNTER — Ambulatory Visit (INDEPENDENT_AMBULATORY_CARE_PROVIDER_SITE_OTHER): Payer: Medicare HMO | Admitting: Physician Assistant

## 2023-02-04 ENCOUNTER — Encounter (INDEPENDENT_AMBULATORY_CARE_PROVIDER_SITE_OTHER): Payer: Self-pay | Admitting: Physician Assistant

## 2023-02-04 VITALS — BP 112/75 | HR 60 | Temp 98.3°F | Ht 62.0 in | Wt 205.0 lb

## 2023-02-04 DIAGNOSIS — Z789 Other specified health status: Secondary | ICD-10-CM

## 2023-02-04 DIAGNOSIS — R7303 Prediabetes: Secondary | ICD-10-CM

## 2023-02-04 DIAGNOSIS — R5383 Other fatigue: Secondary | ICD-10-CM | POA: Diagnosis not present

## 2023-02-04 DIAGNOSIS — E782 Mixed hyperlipidemia: Secondary | ICD-10-CM | POA: Diagnosis not present

## 2023-02-04 DIAGNOSIS — Z6837 Body mass index (BMI) 37.0-37.9, adult: Secondary | ICD-10-CM

## 2023-02-04 DIAGNOSIS — F5089 Other specified eating disorder: Secondary | ICD-10-CM | POA: Diagnosis not present

## 2023-02-04 DIAGNOSIS — D320 Benign neoplasm of cerebral meninges: Secondary | ICD-10-CM | POA: Diagnosis not present

## 2023-02-04 DIAGNOSIS — E785 Hyperlipidemia, unspecified: Secondary | ICD-10-CM | POA: Diagnosis not present

## 2023-02-04 DIAGNOSIS — D329 Benign neoplasm of meninges, unspecified: Secondary | ICD-10-CM | POA: Diagnosis not present

## 2023-02-04 DIAGNOSIS — E669 Obesity, unspecified: Secondary | ICD-10-CM

## 2023-02-04 NOTE — Telephone Encounter (Signed)
Brittney Ruiz, Patient has been approved.  Medication should arrive any day now in time for her upcoming appt 02/12/23.  Thanks [name]

## 2023-02-04 NOTE — Telephone Encounter (Signed)
Jenna, Correction.  My apologies.  She received her 2nd dose already 01/19/23.  Next dose is 3/25.  Thanks [name]

## 2023-02-04 NOTE — Progress Notes (Signed)
.[facility]  Office: [phone]  /  Fax: [phone]  WEIGHT SUMMARY AND BIOMETRICS  Vitals Temp: 98.3 F (36.8 C) BP: 112/75 Pulse Rate: 60 SpO2: 97 %   Anthropometric Measurements Height: 5\' 2"  (1.575 m) Weight: 205 lb (93 kg) BMI (Calculated): 37.49 Weight at Last Visit: 205 lb Weight Lost Since Last Visit: 0 Weight Gained Since Last Visit: 0 Starting Weight: 217 lb Total Weight Loss (lbs): 12 lb (5.443 kg)   Body Composition  Body Fat %: 47.8 % Fat Mass (lbs): 98.2 lbs Muscle Mass (lbs): 101.8 lbs Total Body Water (lbs): 78.8 lbs Visceral Fat Rating : 14   Other Clinical Data Fasting: yes Labs: no Today's Visit #: 39 Starting Date: 06/01/19     HPI  Chief Complaint: OBESITY  Brittney Ruiz is here to discuss her progress with her obesity treatment plan. She is on the the Category 3 Plan and keeping a food journal and adhering to recommended goals of 1600 calories and 90 grams of protein and states she is following her eating plan approximately 60-65 % of the time. She states she is exercising peddle bike 20-30 minutes 5 times per week.   Interval History: Last in office visit 09/14/22 and Video visit 10/14/22.  Since last office visit she has maintained her weight.   The patient, with a history of obesity, prediabetes, and hypercholesterolemia, presents for a follow-up visit. The patient has been dealing with significant stress and emotional eating. She has been seeing a psychologist for depression. The patient has a meningioma, which is being monitored every six months. They also had lung spots, which have since disappeared. The patient is due for a surgical procedure for back pain.  She has been trying to improve their diet by eating more plant-based foods and treating meat as a side dish. Over the past ten weeks, the patient has lost over four pounds and eight inches. The patient is also on Lexiva for cholesterol and triglycerides.  Pharmacotherapy: None for weight  loss  TREATMENT PLAN FOR OBESITY:  Recommended Dietary Goals  Brittney Ruiz is currently in the action stage of change. As such, her goal is to continue weight management plan. She has agreed to keeping a food journal and adhering to recommended goals of 1600 calories and 90 grams of protein and following a lower carbohydrate, vegetable and lean protein rich diet plan.  Behavioral Intervention  We discussed the following Behavioral Modification Strategies today: increasing lean protein intake, decreasing simple carbohydrates , increasing vegetables, increasing lower glycemic fruits, increasing fiber rich foods, avoiding skipping meals, increasing water intake, work on tracking and journaling calories using tracking application, emotional eating strategies and understanding the difference between hunger signals and cravings, work on managing stress, creating time for self-care and relaxation measures, continue to practice mindfulness when eating, and planning for success.  Additional resources provided today: NA  Recommended Physical Activity Goals  Brittney Ruiz has been advised to work up to 150 minutes of moderate intensity aerobic activity a week and strengthening exercises 2-3 times per week for cardiovascular health, weight loss maintenance and preservation of muscle mass.   She has agreed to Continue current level of physical activity  and Think about ways to increase daily physical activity and overcoming barriers to exercise   Pharmacotherapy We discussed various medication options to help Brittney Ruiz with her weight loss efforts and we both agreed to continue to work on nutritional and behavioral strategies to promote weight loss.      Return in about 6 weeks (around  03/18/2023).Marland Kitchen She was informed of the importance of frequent follow up visits to maximize her success with intensive lifestyle modifications for her multiple health conditions.  PHYSICAL EXAM:  Blood pressure 112/75, pulse 60, temperature  98.3 F (36.8 C), height 5\' 2"  (1.575 m), weight 205 lb (93 kg), SpO2 97%. Body mass index is 37.49 kg/m.  General: She is overweight, cooperative, alert, well developed, and in no acute distress. PSYCH: Has normal mood, affect and thought process.   Cardiovascular: HR 60's BP 112/75 Lungs: Normal breathing effort, no conversational dyspnea.  DIAGNOSTIC DATA REVIEWED:  BMET    Component Value Date/Time   NA 140 07/30/2022 1140   NA 136 10/06/2012 0434   K 4.6 07/30/2022 1140   K 3.6 10/06/2012 0434   CL 101 07/30/2022 1140   CL 104 10/06/2012 0434   CO2 23 07/30/2022 1140   CO2 26 10/06/2012 0434   GLUCOSE 100 (H) 07/30/2022 1140   GLUCOSE 92 03/03/2021 1412   GLUCOSE 114 (H) 10/06/2012 0434   BUN 21 07/30/2022 1140   BUN 7 10/06/2012 0434   CREATININE 0.67 07/30/2022 1140   CREATININE 0.73 08/09/2015 1609   CALCIUM 9.8 07/30/2022 1140   CALCIUM 9.3 10/06/2012 0434   GFRNONAA >60 10/07/2020 1506   GFRNONAA >60 10/06/2012 0434   GFRAA 107 05/07/2020 1302   GFRAA >60 10/06/2012 0434   Lab Results  Component Value Date   HGBA1C 5.7 (H) 07/30/2022   HGBA1C 5.4 10/02/2014   Lab Results  Component Value Date   INSULIN 7.9 07/30/2022   INSULIN 26.1 (H) 06/01/2019   Lab Results  Component Value Date   TSH 1.460 07/30/2022   CBC    Component Value Date/Time   WBC 7.5 10/07/2020 1506   RBC 5.28 (H) 10/07/2020 1506   HGB 15.4 (H) 10/07/2020 1506   HGB 14.5 06/01/2019 1235   HCT 45.5 10/07/2020 1506   HCT 43.1 06/01/2019 1235   PLT 152 10/07/2020 1506   PLT 175 06/01/2019 1235   MCV 86.2 10/07/2020 1506   MCV 86 06/01/2019 1235   MCV 86 10/06/2012 0434   MCH 29.2 10/07/2020 1506   MCHC 33.8 10/07/2020 1506   RDW 13.3 10/07/2020 1506   RDW 13.6 06/01/2019 1235   RDW 13.8 10/06/2012 0434   Iron Studies    Component Value Date/Time   IRON 77 04/21/2021 1130   TIBC 283 04/21/2021 1130   FERRITIN 336 (H) 04/21/2021 1133   IRONPCTSAT 27 04/21/2021 1130    Lipid Panel     Component Value Date/Time   CHOL 238 (H) 07/30/2022 1140   TRIG 160 (H) 07/30/2022 1140   HDL 33 (L) 07/30/2022 1140   CHOLHDL 7.2 (H) 07/30/2022 1140   CHOLHDL 8 05/02/2018 1457   VLDL 72.0 (H) 05/02/2018 1457   LDLCALC 175 (H) 07/30/2022 1140   LDLDIRECT 141.0 05/02/2018 1457   Hepatic Function Panel     Component Value Date/Time   PROT 6.6 07/30/2022 1140   PROT 7.3 10/06/2012 0434   ALBUMIN 4.8 07/30/2022 1140   ALBUMIN 4.2 10/06/2012 0434   AST 27 07/30/2022 1140   AST 17 10/06/2012 0434   ALT 39 (H) 07/30/2022 1140   ALT 27 10/06/2012 0434   ALKPHOS 84 07/30/2022 1140   ALKPHOS 64 10/06/2012 0434   BILITOT 0.4 07/30/2022 1140   BILITOT 0.6 10/06/2012 0434   BILIDIR 0.1 03/24/2021 1502      Component Value Date/Time   TSH 1.460 07/30/2022 1140  TSH 1.48 03/03/2021 1412   Nutritional Lab Results  Component Value Date   VD25OH 44.0 07/30/2022   VD25OH 48.7 06/09/2021   VD25OH 58.9 12/04/2020    ASSOCIATED CONDITIONS ADDRESSED TODAY  ASSESSMENT AND PLAN  Problem List Items Addressed This Visit     Generalized obesity   HLD (hyperlipidemia)   Relevant Medications   Inclisiran Sodium (LEQVIO [place])   Other Relevant Orders   Lipid Panel With LDL/HDL Ratio   Lipoprotein A (LPA)   Lipoprotein Analysis by NMR   Prediabetes - Primary   Relevant Orders   CMP14+EGFR   Hemoglobin A1c   Insulin, random   Other Specified Feeding or Eating Disorder, Emotional Eating Behaviors   Other Visit Diagnoses     Statin intolerance       Fatigue, unspecified type       Relevant Orders   Vitamin B12   CBC with Differential/Platelet   TSH   VITAMIN D 25 Hydroxy (Vit-D Deficiency, Fractures)     Obesity Patient has lost 4.5 pounds and 8 inches over the past 10 weeks with a plant-based diet and limited exercise due to back pain. She is also dealing with significant stress and emotional eating. -Continue current diet and exercise as  tolerated. -Perform Indirect Calorimetry (IC) test on 03/15/2023 to assess metabolic rate and caloric needs.  Hypercholesterolemia Patient is on Leqvio for cholesterol and triglycerides. She has an upcoming appointment with her cardiologist. -Order lipid panel and lipoprotein A tests today to assess the effectiveness of Leqvio.  Prediabetes No recent labs to assess current status. -Order fasting glucose and HbA1c today.  Meningioma Stable on recent imaging, with follow-up MRI scheduled at [facility]. -No changes to current management plan.  Depression Patient reports feeling depressed and is seeing a psychologist/psychiatry regularly. -Continue current mental health treatment.  Back Pain Patient had right-sided back surgery and is scheduled for left-sided surgery on 02/22/2023. -Post-operative follow-up as planned with surgeon.  General Health Maintenance/Fatigue -Order comprehensive metabolic panel, liver function tests, B 12 and TSH and vitamin D level today.   ATTESTASTION STATEMENTS:  Reviewed by clinician on day of visit: allergies, medications, problem list, medical history, surgical history, family history, social history, and previous encounter notes.   I have personally spent 30 minutes total time today in preparation, patient care, nutritional counseling and documentation for this visit, including the following: review of clinical lab tests; review of medical tests/procedures/services.      Thorvald Orsino, PA-C

## 2023-02-06 LAB — LIPOPROTEIN ANALYSIS BY NMR
HDL Particle Number: 27.6 umol/L — ABNORMAL LOW (ref >=30.5)
LDL Particle Number: 1066 nmol/L — ABNORMAL HIGH (ref <1000)
LDL Size: 20.3 nm — ABNORMAL LOW (ref >20.5)
LP-IR Score: 67 — ABNORMAL HIGH (ref <=45)
Small LDL Particle Number: 551 nmol/L — ABNORMAL HIGH (ref <=527)

## 2023-02-06 LAB — CBC WITH DIFFERENTIAL/PLATELET
Basophils Absolute: 0 10*3/uL (ref 0.0–0.2)
Basos: 0 %
EOS (ABSOLUTE): 0.2 10*3/uL (ref 0.0–0.4)
Eos: 3 %
Hematocrit: 42.2 % (ref 34.0–46.6)
Hemoglobin: 13.8 g/dL (ref 11.1–15.9)
Immature Grans (Abs): 0 10*3/uL (ref 0.0–0.1)
Immature Granulocytes: 1 %
Lymphocytes Absolute: 1.9 10*3/uL (ref 0.7–3.1)
Lymphs: 29 %
MCH: 28.9 pg (ref 26.6–33.0)
MCHC: 32.7 g/dL (ref 31.5–35.7)
MCV: 89 fL (ref 79–97)
Monocytes Absolute: 0.4 10*3/uL (ref 0.1–0.9)
Monocytes: 6 %
Neutrophils Absolute: 3.9 10*3/uL (ref 1.4–7.0)
Neutrophils: 61 %
Platelets: 145 10*3/uL — ABNORMAL LOW (ref 150–450)
RBC: 4.77 x10E6/uL (ref 3.77–5.28)
RDW: 14 % (ref 11.7–15.4)
WBC: 6.4 10*3/uL (ref 3.4–10.8)

## 2023-02-06 LAB — TSH: TSH: 1.83 u[IU]/mL (ref 0.450–4.500)

## 2023-02-06 LAB — VITAMIN B12: Vitamin B-12: 859 pg/mL (ref 232–1245)

## 2023-02-06 LAB — CMP14+EGFR
ALT: 147 IU/L — ABNORMAL HIGH (ref 0–32)
AST: 119 IU/L — ABNORMAL HIGH (ref 0–40)
Albumin: 4.5 g/dL (ref 3.8–4.9)
Alkaline Phosphatase: 77 IU/L (ref 44–121)
BUN/Creatinine Ratio: 22 (ref 9–23)
BUN: 17 mg/dL (ref 6–24)
Bilirubin Total: 0.5 mg/dL (ref 0.0–1.2)
CO2: 23 mmol/L (ref 20–29)
Calcium: 9 mg/dL (ref 8.7–10.2)
Chloride: 100 mmol/L (ref 96–106)
Creatinine, Ser: 0.76 mg/dL (ref 0.57–1.00)
Globulin, Total: 1.8 g/dL (ref 1.5–4.5)
Glucose: 88 mg/dL (ref 70–99)
Potassium: 4.3 mmol/L (ref 3.5–5.2)
Sodium: 138 mmol/L (ref 134–144)
Total Protein: 6.3 g/dL (ref 6.0–8.5)
eGFR: 92 mL/min/{1.73_m2} (ref >59)

## 2023-02-06 LAB — HEMOGLOBIN A1C
Est. average glucose Bld gHb Est-mCnc: 114 mg/dL
Hgb A1c MFr Bld: 5.6 % (ref 4.8–5.6)

## 2023-02-06 LAB — INSULIN, RANDOM: INSULIN: 12.3 u[IU]/mL (ref 2.6–24.9)

## 2023-02-06 LAB — VITAMIN D 25 HYDROXY (VIT D DEFICIENCY, FRACTURES): Vit D, 25-Hydroxy: 46.4 ng/mL (ref 30.0–100.0)

## 2023-02-06 LAB — LIPID PANEL WITH LDL/HDL RATIO
Cholesterol, Total: 160 mg/dL (ref 100–199)
HDL: 40 mg/dL (ref >39)
LDL Chol Calc (NIH): 87 mg/dL (ref 0–99)
LDL/HDL Ratio: 2.2 ratio (ref 0.0–3.2)
Triglycerides: 195 mg/dL — ABNORMAL HIGH (ref 0–149)
VLDL Cholesterol Cal: 33 mg/dL (ref 5–40)

## 2023-02-06 LAB — LIPOPROTEIN A (LPA): Lipoprotein (a): 127.4 nmol/L — ABNORMAL HIGH (ref <75.0)

## 2023-02-07 DIAGNOSIS — Z789 Other specified health status: Secondary | ICD-10-CM | POA: Insufficient documentation

## 2023-02-08 ENCOUNTER — Ambulatory Visit: Payer: Medicare HMO | Admitting: Licensed Clinical Social Worker

## 2023-02-09 DIAGNOSIS — R531 Weakness: Secondary | ICD-10-CM | POA: Diagnosis not present

## 2023-02-09 DIAGNOSIS — M5136 Other intervertebral disc degeneration, lumbar region: Secondary | ICD-10-CM | POA: Diagnosis not present

## 2023-02-12 ENCOUNTER — Ambulatory Visit (HOSPITAL_BASED_OUTPATIENT_CLINIC_OR_DEPARTMENT_OTHER): Payer: Medicare HMO | Admitting: Internal Medicine

## 2023-02-12 VITALS — BP 130/80 | HR 64 | Ht 62.0 in | Wt 207.0 lb

## 2023-02-12 DIAGNOSIS — T466X5D Adverse effect of antihyperlipidemic and antiarteriosclerotic drugs, subsequent encounter: Secondary | ICD-10-CM

## 2023-02-12 DIAGNOSIS — M791 Myalgia, unspecified site: Secondary | ICD-10-CM

## 2023-02-12 DIAGNOSIS — R748 Abnormal levels of other serum enzymes: Secondary | ICD-10-CM | POA: Diagnosis not present

## 2023-02-12 DIAGNOSIS — E782 Mixed hyperlipidemia: Secondary | ICD-10-CM

## 2023-02-12 NOTE — Progress Notes (Signed)
LIPID CLINIC CONSULT NOTE  Chief Complaint:  Follow-up dyslipidemia  Primary Care Physician: Allegra Grana, FNP  Primary Cardiologist:  None  HPI:  Brittney Ruiz is a 57 y.o. female who is being seen today for the evaluation of dyslipidemia at the request of Jason Coop Lyn Records, FNP. This is a 57 year old female kindly referred for evaluation management of dyslipidemia.  She reports this is a longstanding issue which is basically related to weight.  She has had issues with anxiety and depression and was on antidepressant medications and gained over 100 pounds.  She is been working to lose out and is managed to lose about 50 pounds.  She has a family history of heart disease she says in her father and grandfather and has COPD and other cardiovascular risk factors.  She has been seen by my partner Dr. Kirke Corin about released from cardiology due to some mitral valve disease which apparently improved.  She did have a CT coronary angiogram in 2020 which showed no evidence of coronary artery disease and 0 coronary calcium.  I share that information with her again today and the importance of the very high negative predictive value of a study like that.  Overall I find that reassuring despite the fact that her cholesterol has been elevated.  In general the lipids have been slowly coming down, but she cannot tolerate any medications.  She cannot take any statins due to side effects.  She cannot take ezetimibe on a daily basis and currently only takes a quarter of a tablet 1 or 2 times a week.  She has over 41 different allergies to medications, foods and other products.  She reports being fairly sedentary due to issues with plantar fasciitis.  She continues to work with the weight loss clinic regarding dietary approaches to weight loss.  Her most recent lipid profile showed total cholesterol 217, triglycerides 148, HDL 30 and LDL of 160.  10/18/2020  Ms. Skalicky returns today for video visit.  Although she  has been making great strides in her diet and has lost 50 pounds with significant improvement in her food choices, her cholesterol still remains elevated.  She was initially trying to take a quarter of the 20 mg tablets her husband was taking of rosuvastatin.  She says she slowly increase that dose from 5 mg a day up to 20 mg a day, 1 full tablet over a period of 4 weeks.  Ultimately then she developed significant side effects.  She stopped it for 2 weeks and then tried to go back to red yeast rice which also caused her side effects including significant myalgias.  Cholesterol remains very high in fact up close to 190 and there is a very strong family history of multiple family members that have high cholesterol on statins.  This could be a familial hyperlipidemia, especially without the significant change in cholesterol numbers.  09/09/2022  Ms. Kramar is seen today in follow-up. We had previously arranged to put her on Repatha which she took one dose and had side effects, however, her cholesterol did improve substantially. She has continued to lose weight, however, her cholesterol has not further improved.  Recent repeat lipids show TC 238, TG 160, HDL 33, and LDL 175.  She was referred back to see what additional options are available.    02/12/2023  Ms. Applegate returns today for follow-up of her dyslipidemia.  She has had significant improvement in her lipids on Leqvio.  Her total cholesterol now  160, triglycerides 195, HDL 40 and LDL 87 (down from 175).  Her LP(a) is elevated as expected at 127.4 nmol/L.  While all this shows excellent improvement, I am concerned that her additional blood work shows elevation in her liver enzymes.  Her AST and ALT were 119 and 147 but previously were essentially normal with very mild elevation in ALT in the mid 30s.  Review of the chart indicates she was seen by gastroenterologist back in 2022 and underwent extensive workup for elevated liver enzymes which were only  minimally elevated but the conclusion was that she has hepatic steatosis.  I cannot find any other medications that may be contributing to her elevated liver enzymes other than the recent start of Leqvio.  Wilber Bihari is highly unlikely to cause an elevation of liver enzymes although the effect of the medication is delivered to the liver.  Literature suggest that the frequency of liver enzyme elevation with this medication should be less than 1% of the population.  Unfortunately, we cannot undo the last injection she had a few weeks ago.  PMHx:  Past Medical History:  Diagnosis Date   Abnormal Pap smear of cervix    h/o LEEP   Allergy    Angio-edema    Anxiety    Asthma    Bladder leak    Cancer (HCC)    melanoma 2003   Chest pain    Constipation    COPD (chronic obstructive pulmonary disease) (HCC)    Depression    Diarrhea    Food allergy    Heart valve problem    Heartburn    History of swelling of feet    Hyperlipidemia    Hypertension    IBS (irritable bowel syndrome)    Joint pain    Low back pain    Melanoma (HCC)    right foot s/p lymph node removed right groin   Obesity (BMI 30-39.9)    Palpitation    Prediabetes    Shortness of breath    Urticaria    Vaginal delivery    X 2    Past Surgical History:  Procedure Laterality Date   BLADDER SUSPENSION  07/2017   COLONOSCOPY WITH PROPOFOL N/A 07/01/2017   Procedure: COLONOSCOPY WITH PROPOFOL;  Surgeon: Pasty Spillers, MD;  Location: [facility] ENDOSCOPY;  Service: Endoscopy;  Laterality: N/A;   COLONOSCOPY WITH PROPOFOL N/A 08/05/2022   Procedure: COLONOSCOPY WITH PROPOFOL;  Surgeon: Wyline Mood, MD;  Location: [facility] ENDOSCOPY;  Service: Gastroenterology;  Laterality: N/A;   ESOPHAGOGASTRODUODENOSCOPY (EGD) WITH PROPOFOL N/A 07/01/2017   Procedure: ESOPHAGOGASTRODUODENOSCOPY (EGD) WITH PROPOFOL;  Surgeon: Pasty Spillers, MD;  Location: [facility] ENDOSCOPY;  Service: Endoscopy;  Laterality: N/A;   LEEP  1995   LEEP      PARTIAL HYSTERECTOMY  08/2017   s/p partial hysterectomy - per patient has fallopian tubes; ovaries INTACT. NO CERVIX on exam 03/06/20   SKIN CANCER EXCISION     melanoma   TONSILLECTOMY      FAMHx:  Family History  Problem Relation Age of Onset   Hyperlipidemia Mother    Hypertension Mother    Diabetes Mother    Breast cancer Mother 89   Stroke Mother    Cancer Mother    Cancer Father        lung   Depression Father    Diabetes Father    Hypertension Father    Hyperlipidemia Father    Sleep apnea Father    Alcoholism Father  Hyperlipidemia Sister    Hypertension Sister    Migraines Sister    Hypertension Brother    Hyperlipidemia Brother    Migraines Brother    Cancer Maternal Grandmother        glioblastoma   Depression Maternal Grandfather    Heart disease Maternal Grandfather    Cancer Paternal Grandfather        unknown   AAA (abdominal aortic aneurysm) Paternal Grandfather     SOCHx:   reports that she quit smoking about 16 years ago. Her smoking use included cigarettes. She started smoking about 41 years ago. She has a 25 pack-year smoking history. She has never used smokeless tobacco. She reports that she does not drink alcohol and does not use drugs.  ALLERGIES:  Allergies  Allergen Reactions   Codeine Other (See Comments) and Hives   Elemental Sulfur Swelling, Hives and Other (See Comments)   Pork Allergy Swelling    Other reaction(s): OTHER   Pork-Derived Products Swelling    Other reaction(s): OTHER Other reaction(s): OTHER Other reaction(s): OTHER    Aciphex [Rabeprazole Sodium]    Antivert [Meclizine Hcl]     Causes dizziness and hives   Buspar [Buspirone Hcl]     hives   Cat Hair Extract     Other reaction(s): SHORTNESS OF BREATH   Chlorpheniramine-Pseudoeph    Chlorpheniramine-Pseudoeph Other (See Comments)    Messes with her anxiety   Chocolate Flavor    Crestor [Rosuvastatin Calcium]     Could not tolerate    Cymbalta [Duloxetine  Hcl]    Duloxetine Other (See Comments)    Other reaction(s): HIVES Other reaction(s): HIVES    Effexor Xr [Venlafaxine Hydrochloride]     hives   Epinephrine    Erythromycin     Throwing up   Escitalopram Oxalate    Lamictal [Lamotrigine]     hives   Levofloxacin Swelling    Levofloxacin   Lipitor [Atorvastatin]     Multiple symptoms   Meclizine Other (See Comments)   Mometasone Furoate    Mometasone Furoate    Monosodium Glutamate     Other reaction(s): OTHER Other reaction(s): OTHER    Neurontin [Gabapentin]     Hives    Other     Other reaction(s): HIVES Other reaction(s): HIVES Other reaction(s): HIVES Other reaction(s): HIVES    Prednisone    Red Yeast Rice Extract     aches   Rosuvastatin    Shellfish Allergy     Other reaction(s): HIVES Other reaction(s): HIVES   Sudafed [Pseudoephedrine Hcl]     Causes severe anxiety   Sulfa Antibiotics Other (See Comments)    Other reaction(s): HIVES Other reaction(s): HIVES    Sulfasalazine Other (See Comments)   Valium    Wellbutrin [Bupropion Hcl]    Wixela Inhub [Fluticasone-Salmeterol]     Increased heart rate   Xanax Xr [Alprazolam]     Hives and anxiety   Zetia [Ezetimibe]     body aches on full dose   Alfalfa Rash   Brompheniramine-Phenylephrine Palpitations    hypersensitive     Buspirone Rash and Other (See Comments)   Tape Rash    ROS: Pertinent items noted in HPI and remainder of comprehensive ROS otherwise negative.  HOME MEDS: Current Outpatient Medications on File Prior to Visit  Medication Sig Dispense Refill   albuterol (VENTOLIN HFA) 108 (90 Base) MCG/ACT inhaler USE 2 INHALATIONS ORALLY   EVERY 6 HOURS AS NEEDED. 18 g 1  Alcohol Swabs (B-D SINGLE USE SWABS REGULAR) PADS USE 1 SWAB TOPICALLY TO    CLEAN FINGER BEFORE        CHECKING GLUCOSE 300 each 1   aspirin EC 81 MG tablet Take 81 mg by mouth at bedtime.     B Complex Vitamins (BL VITAMIN B COMPLEX PO) +Folic acid qd.     Blood  Glucose Monitoring Suppl (TRUE METRIX METER) w/Device KIT USED TO CHECK GLUCOSE READINGS  ONE TIME DAILY AS NEEDED. 1 kit 0   budesonide (PULMICORT) 0.25 MG/2ML nebulizer solution Take 2 mLs (0.25 mg total) by nebulization 2 (two) times daily. 60 mL 2   clorazepate (TRANXENE) 7.5 MG tablet Take 7.5 mg by mouth 3 (three) times daily.     fexofenadine (ALLEGRA) 60 MG tablet Take 1 tablet (60 mg total) by mouth daily. (Patient taking differently: Take 180 mg by mouth daily.) 90 tablet 1   fluticasone (FLONASE) 50 MCG/ACT nasal spray USE 1 SPRAY IN EACH NOSTRILAT BEDTIME 16 g 3   glucose blood (TRUE METRIX BLOOD GLUCOSE TEST) test strip TEST BLOOD SUGAR ONE TIME DAILY 100 strip 0   Inclisiran Sodium (LEQVIO [place]) Inject into the skin.     Multiple Vitamin (MULTIVITAMIN) tablet Take 1 tablet by mouth daily.     mupirocin ointment (BACTROBAN) 2 % Apply 1 Application topically daily. 22 g 0   Omega-3 Fatty Acids (FISH OIL) 1000 MG CAPS Take 2 capsules by mouth daily.     omeprazole (PRILOSEC) 20 MG capsule TAKE 1 CAPSULE DAILY 90 capsule 2   polyethylene glycol powder (GLYCOLAX/MIRALAX) 17 GM/SCOOP powder Take 1 Container by mouth at bedtime.     propranolol (INDERAL) 20 MG tablet Take 1 tablet (20 mg total) by mouth 3 (three) times daily. 90 tablet 3   psyllium (METAMUCIL) 58.6 % powder Take 1 packet by mouth daily.     sertraline (ZOLOFT) 100 MG tablet TAKE 1+1/2 TABLETS BY MOUTH DAILY AS DIRECTED (Patient taking differently: Take 112.5 mg by mouth See admin instructions. 112.5 in the morning and 25 mg in the evening) 45 tablet 3   sertraline (ZOLOFT) 25 MG tablet Take 25 mg by mouth every evening.      TRUEplus Lancets 33G MISC TEST BLOOD SUGAR ONE TIME DAILY 100 each 0   No current facility-administered medications on file prior to visit.    LABS/IMAGING: No results found for this or any previous visit (from the past 48 hour(s)). No results found.  LIPID PANEL:    Component Value Date/Time    CHOL 160 02/04/2023 1555   TRIG 195 (H) 02/04/2023 1555   HDL 40 02/04/2023 1555   CHOLHDL 7.2 (H) 07/30/2022 1140   CHOLHDL 8 05/02/2018 1457   VLDL 72.0 (H) 05/02/2018 1457   LDLCALC 87 02/04/2023 1555   LDLDIRECT 141.0 05/02/2018 1457    WEIGHTS: Wt Readings from Last 3 Encounters:  02/12/23 207 lb (93.9 kg)  02/04/23 205 lb (93 kg)  01/19/23 206 lb (93.4 kg)    VITALS: BP 130/80   Pulse 64   Ht 5\' 2"  (1.575 m)   Wt 207 lb (93.9 kg)   LMP  (LMP Unknown)   SpO2 97%   BMI 37.86 kg/m   EXAM: Deferred  EKG: Deferred  ASSESSMENT: Mixed dyslipidemia, possibly familial hyperlipidemia No CAD with a 0 coronary calcium score (2020) Family history of heart disease in her father and grandfather Obesity with weight loss efforts in place Statin and ezetimibe intolerance Unable to  tolerate Repatha d/t side effects Aortic atherosclerosis seen on CT imaging Hepatic steatosis-elevated liver enzymes  PLAN: 1.   Ms. Dexheimer has had recent liver enzyme elevations that are new but had some history of minimal liver enzyme elevations in the past with extensive workup that demonstrated hepatic steatosis.  It is quite unlikely that the Leqvio has caused elevated liver enzymes but I do not have a better explanation and it does seem to be associated with recent injection.  The medication only works for less than a week or so but the effects of it last up to 6 months.  Therefore I would expect that as the medication effect dwindles the liver enzymes should improve.  I would then recommend repeating her liver enzymes in about a month to see if they have improved or normalized.  If they persist, would advise not proceeding with additional Leqvio injections.  If it normalizes, I would recommend a repeat injection but be certain to follow-up with liver enzymes in 2 to 4 weeks after the injection to determine if they are again elevated.  Unfortunately the options for therapy are quite limited.  She may  have had side effects with Repatha but only had taken 1 dose.  She said she would be willing to retry that or the competitor Praluent if Leqvio would not be tolerated due to liver enzyme elevations.  Chrystie Nose, MD, [facility], FACP  [place]  [facility]  Medical Director of the Advanced Lipid Disorders &  Cardiovascular Risk Reduction Clinic Diplomate of the American Board of Clinical Lipidology Attending Cardiologist  Direct Dial: [phone]  Fax: [phone]  Website:  [email or web address] 02/12/2023, 4:56 PM

## 2023-02-12 NOTE — Patient Instructions (Signed)
Medication Instructions:  NO CHANGES  *If you need a refill on your cardiac medications before your next appointment, please call your pharmacy*   Lab Work: Hepatic Function Panel in 1 month  If you have labs (blood work) drawn today and your tests are completely normal, you will receive your results only by: MyChart Message (if you have MyChart) OR A paper copy in the mail If you have any lab test that is abnormal or we need to change your treatment, we will call you to review the results.    Follow-Up: At [facility], you and your health needs are our priority.  As part of our continuing mission to provide you with exceptional heart care, we have created designated Provider Care Teams.  These Care Teams include your primary Cardiologist (physician) and Advanced Practice Providers (APPs -  Physician Assistants and Nurse Practitioners) who all work together to provide you with the care you need, when you need it.  We recommend signing up for the patient portal called "MyChart".  Sign up information is provided on this After Visit Summary.  MyChart is used to connect with patients for Virtual Visits (Telemedicine).  Patients are able to view lab/test results, encounter notes, upcoming appointments, etc.  Non-urgent messages can be sent to your provider as well.   To learn more about what you can do with MyChart, go to [email or web address].    Your next appointment:    April/May 2025 with Dr. Rennis Golden

## 2023-02-16 DIAGNOSIS — M51361 Other intervertebral disc degeneration, lumbar region with lower extremity pain only: Secondary | ICD-10-CM | POA: Diagnosis not present

## 2023-02-16 DIAGNOSIS — R531 Weakness: Secondary | ICD-10-CM | POA: Diagnosis not present

## 2023-02-17 ENCOUNTER — Ambulatory Visit: Payer: Medicare HMO | Admitting: Licensed Clinical Social Worker

## 2023-02-17 DIAGNOSIS — F411 Generalized anxiety disorder: Secondary | ICD-10-CM

## 2023-02-17 NOTE — Progress Notes (Signed)
[facility] Counselor/Therapist Progress Note  Patient ID: Brittney Ruiz, MRN: [id number]    Date: 02/17/23  Time Spent: 110  pm - 0200 pm : 50 Minutes  Treatment Type: Individual Therapy.  Reported Symptoms: Depression and anxiety, loss of interest  Mental Status Exam: Appearance:  Casual     Behavior: Appropriate  Motor: Normal  Speech/Language:  Clear and Coherent  Affect: Appropriate  Mood: normal  Thought process: normal  Thought content:   WNL  Sensory/Perceptual disturbances:   WNL  Orientation: oriented to person, place, time/date, situation, day of week, month of year, and year  Attention: Good  Concentration: Good  Memory: WNL  Fund of knowledge:  Good  Insight:   Good  Judgment:  Good  Impulse Control: Good   Risk Assessment: Danger to Self:  No Self-injurious Behavior: No Danger to Others: No Duty to Warn:no Physical Aggression / Violence:No  Access to Firearms a concern: No  Gang Involvement:No   Subjective:   Brittney Ruiz participated from office located at [facility] with Clinician present. Brittney Ruiz consented to treatment.   Patient presented for her session and was engaged in discussion. Patient identified that she has a loss of interest in her hobbies. Patient reports that she gets up each day and tends to daily duties but beyond the morning routine she doesn't have a routine. Patient reports that she doesn't cook meals and her husband resorts to them eating out which she states is bad for her weight loss goals. Patient acknowledged a need to build a afternoon and bedtime routine to assist her in completing tasks.  Interventions: Cognitive Behavioral Therapy and Solution-Oriented/Positive Psychology  Clinician conducted session with patient in the office. Clinician provided supportive therapy, active listening and validation as patient discussed her lack of interest and a need to build a daily routine for her afternoons. Clinician  encouraged patient to make a daily menu to assist in meal planning and to prevent eating out daily.   Diagnosis: Generalized Anxiety Disorder   Plan: Brittney Ruiz  is to use CBT, mindfulness and coping skills to help manage decrease symptoms associated with their diagnosis.   Long-term goal:   Brittney Ruiz will reduce overall level, frequency, and intensity of the feelings of depression, anxiety evidenced by       decreased irritability, negative self talk, and helpless feelings from 6 to 7 days/week to 0 to 2 days/week per client report for at least 3 consecutive months. Treatment plan to be reviewed by 11/02/2023.  Short-term goal:  Brittney Ruiz will verbally express understanding of the relationship between feelings of depression, anxiety and their impact on thinking patterns and behaviors. Verbalize an understanding of the role that distorted thinking plays in creating fears, excessive worry, and ruminations.  Phyllis Ginger MSW, LCSW DATE:02/17/2023

## 2023-02-22 DIAGNOSIS — M47816 Spondylosis without myelopathy or radiculopathy, lumbar region: Secondary | ICD-10-CM | POA: Diagnosis not present

## 2023-02-23 ENCOUNTER — Telehealth (INDEPENDENT_AMBULATORY_CARE_PROVIDER_SITE_OTHER): Payer: Medicare HMO | Admitting: Psychology

## 2023-02-23 DIAGNOSIS — F419 Anxiety disorder, unspecified: Secondary | ICD-10-CM

## 2023-02-23 DIAGNOSIS — F5089 Other specified eating disorder: Secondary | ICD-10-CM

## 2023-02-23 DIAGNOSIS — F32A Depression, unspecified: Secondary | ICD-10-CM | POA: Diagnosis not present

## 2023-02-23 NOTE — Progress Notes (Signed)
Office: [phone]  /  Fax: [phone]    Date: February 23, 2023  Appointment Start Time: 1:56pm Duration: 38 minutes Provider: Lawerance Cruel, Psy.D. Type of Session: Individual Therapy  Location of Patient: Home (private location) Location of Provider: Provider's Home (private office) Type of Contact: Telepsychological Visit via MyChart Video Visit  Session Content: Brittney Ruiz is a 57 y.o. female presenting for a follow-up appointment to address the previously established treatment goal of increasing coping skills.Today's appointment was a telepsychological visit. Brittney Ruiz provided verbal consent for today's telepsychological appointment and she is aware she is responsible for securing confidentiality on her end of the session. Prior to proceeding with today's appointment, Brittney Ruiz's physical location at the time of this appointment was obtained as well a phone number she could be reached at in the event of technical difficulties. Brittney Ruiz and this provider participated in today's telepsychological service.   Of note, today's appointment was switched to a regular telephone call at 2:11pm with Brittney Ruiz's verbal consent due to technical issues.   This provider conducted a brief check-in. Brittney Ruiz stated she received a grant to help pay with co-pays for mental health services. She stated she initiated therapeutic services with [facility] and she had back surgery yesterday. Brittney Ruiz indicated she began experiencing passive suicidal ideation (i.e., "The world would be better off without me"and "I'm not needed") "occassionally" in the past four months, noting the last time was approximately a week ago. She denied experiencing suicidal plan and intent. Brittney Ruiz disclosed approximately 23 years ago she also experienced suicidal ideation, and again denied experiencing suicidal plan and intent. Brittney Ruiz clarified she "want[s] to be here" but feels "no one cares." The following protective factors were identified for Brittney Ruiz:  mother, grandchildren, husband, and faith. If she were to become overwhelmed in the future, which is a sign that a crisis may occur, she identified the following coping skills she could engage in: knit, read bible, read devotions, work on quilt, and go on [employer]. It was recommended the aforementioned be written down and developed into a coping card for future reference. Psychoeducation regarding the importance of reaching out to a trusted individual and/or utilizing emergency resources if there is a change in emotional status and/or there is an inability to ensure safety was provided. Brittney Ruiz's confidence in reaching out to a trusted individual and/or utilizing emergency resources should there be an intensification in emotional status and/or there is an inability to ensure safety was assessed on a scale of one to ten where one is not confident and ten is extremely confident. She reported her confidence is a 10. Regarding eating habits, Brittney Ruiz stated she is experiencing decreased appetite and engagement in emotional eating behaviors, adding she craves sweets. Associated thoughts and feelings processed. Overall, Brittney Ruiz was receptive to today's appointment as evidenced by openness to sharing and responsiveness to feedback.  Mental Status Examination:  Appearance: neat Behavior: appropriate to circumstances Mood: depressed Affect: mood congruent; tearful Speech: WNL Eye Contact: appropriate Psychomotor Activity: WNL Gait: unable to assess Thought Process: linear, logical, and goal directed and denies suicidal, homicidal, and self-harm ideation, plan and intent  Thought Content/Perception: no hallucinations, delusions, bizarre thinking or behavior endorsed or observed Orientation: AAOx4 Memory/Concentration: intact Insight: fair Judgment: fair  Structured Assessments Results: The Patient Health Questionnaire-9 (PHQ-9) is a self-report measure that assesses symptoms and severity of depression over the course  of the last two weeks. Clarity obtained a score of 22 suggesting severe depression. Brittney Ruiz finds the endorsed symptoms to be very difficult. [  0= Not at all; 1= Several days; 2= More than half the days; 3= Nearly every day] Little interest or pleasure in doing things 3  Feeling down, depressed, or hopeless 3  Trouble falling or staying asleep, or sleeping too much 2  Feeling tired or having little energy 3  Poor appetite or overeating 3  Feeling bad about yourself --- or that you are a failure or have let yourself or your family down 3  Trouble concentrating on things, such as reading the newspaper or watching television 3  Moving or speaking so slowly that other people could have noticed? Or the opposite --- being so fidgety or restless that you have been moving around a lot more than usual 1  Thoughts that you would be better off dead or hurting yourself in some way 1  PHQ-9 Score 22    The Generalized Anxiety Disorder-7 (GAD-7) is a brief self-report measure that assesses symptoms of anxiety over the course of the last two weeks. Brittney Ruiz obtained a score of 17 suggesting severe anxiety. Brittney Ruiz finds the endorsed symptoms to be very difficult. [0= Not at all; 1= Several days; 2= Over half the days; 3= Nearly every day] Feeling nervous, anxious, on edge 3  Not being able to stop or control worrying 3  Worrying too much about different things 3  Trouble relaxing 3  Being so restless that it's hard to sit still 0  Becoming easily annoyed or irritable 3  Feeling afraid as if something awful might happen 2  GAD-7 Score 17   Interventions:  Conducted a brief chart review Verbally administered PHQ-9 and GAD-7 for symptom monitoring Conducted a risk assessment Provided empathic reflections and validation Employed supportive psychotherapy interventions to facilitate reduced distress and to improve coping skills with identified stressors  DSM-5 Diagnosis(es):  F50.89 Other Specified Feeding or Eating  Disorder, Emotional Eating Behaviors, F41.9 Unspecified Anxiety Disorder, and  F32.A Unspecified Depressive Disorder  Treatment Goal & Progress: During the initial appointment with this provider, the following treatment goal was established: increase coping skills. Progress is limited, as Brittney Ruiz just re-initiated services with this provider.   Plan: The next appointment is scheduled for 03/01/2023 at 9am, which will be via MyChart Video Visit. The next session will focus on working towards the established treatment goal. Brittney Ruiz will continue with her primary therapist and psychiatric provider.

## 2023-03-01 ENCOUNTER — Telehealth (INDEPENDENT_AMBULATORY_CARE_PROVIDER_SITE_OTHER): Payer: Medicare HMO | Admitting: Psychology

## 2023-03-01 DIAGNOSIS — F419 Anxiety disorder, unspecified: Secondary | ICD-10-CM | POA: Diagnosis not present

## 2023-03-01 DIAGNOSIS — F32A Depression, unspecified: Secondary | ICD-10-CM

## 2023-03-01 DIAGNOSIS — F5089 Other specified eating disorder: Secondary | ICD-10-CM | POA: Diagnosis not present

## 2023-03-01 NOTE — Progress Notes (Signed)
  Office: [phone]  /  Fax: [phone]    Date: March 01, 2023  Appointment Start Time: 9:01am Duration: 31 minutes Provider: Lawerance Cruel, Psy.D. Type of Session: Individual Therapy  Location of Patient: Home (private location) Location of Provider: Provider's Home (private office) Type of Contact: Telepsychological Visit via MyChart Video Visit  Session Content: Brittney Ruiz is a 57 y.o. female presenting for a follow-up appointment to address the previously established treatment goal of increasing coping skills.Today's appointment was a telepsychological visit. Brittney Ruiz provided verbal consent for today's telepsychological appointment and she is aware she is responsible for securing confidentiality on her end of the session. Prior to proceeding with today's appointment, Brittney Ruiz's physical location at the time of this appointment was obtained as well a phone number she could be reached at in the event of technical difficulties. Brittney Ruiz and this provider participated in today's telepsychological service.   This provider conducted a brief check-in. Brittney Ruiz stated she is "better," adding she went to church. She described an increased involvement in fundraising and church events has been helpful for mood and sense of "purpose." A risk assessment was completed. Brittney Ruiz reported experiencing passive suicidal ideation ("Would anybody miss me if I was gone?") "2-3 days ago" secondary to a marital conflict. She denied experiencing suicidal plan and intent. She continues to acknowledge understanding regarding the importance of reaching out to trusted individuals and/or emergency resources if she is unable to ensure safety. She expressed desire to initiate marriage counseling, and provided verbal consent for this provider to e-mail referral options. Regarding eating, Brittney Ruiz described an increase in appetite, adding she is "mindful of what [she is] eating." Psychoeducation regarding triggers for emotional eating was provided.  Brittney Ruiz was provided a handout, and encouraged to utilize the handout between now and the next appointment to increase awareness of triggers and frequency. Brittney Ruiz agreed. This provider also discussed behavioral strategies for specific triggers, such as placing the utensil down when conversing to avoid mindless eating. Brittney Ruiz provided verbal consent during today's appointment for this provider to send a handout via e-mail. Overall, Brittney Ruiz was receptive to today's appointment as evidenced by openness to sharing, responsiveness to feedback, and willingness to explore triggers for emotional eating.  Mental Status Examination:  Appearance: neat Behavior: appropriate to circumstances Mood: sad Affect: mood congruent Speech: WNL Eye Contact: appropriate Psychomotor Activity: WNL Gait: unable to assess Thought Process: linear, logical, and goal directed and denies current suicidal, homicidal, and self-harm ideation, plan and intent  Thought Content/Perception: no hallucinations, delusions, bizarre thinking or behavior endorsed or observed Orientation: AAOx4 Memory/Concentration: intact Insight: fair Judgment: fair  Interventions:  Conducted a brief chart review Conducted a risk assessment Provided empathic reflections and validation Employed supportive psychotherapy interventions to facilitate reduced distress and to improve coping skills with identified stressors Psychoeducation provided regarding triggers for emotional eating behaviors  DSM-5 Diagnosis(es):  F50.89 Other Specified Feeding or Eating Disorder, Emotional Eating Behaviors, F41.9 Unspecified Anxiety Disorder, and  F32.A Unspecified Depressive Disorder  Treatment Goal & Progress: During the initial appointment with this provider, the following treatment goal was established: increase coping skills. Brittney Ruiz has demonstrated progress in her goal as evidenced by increased awareness of hunger patterns.   Plan: The next appointment is scheduled for  03/15/2023 at 12:30pm, which will be via MyChart Video Visit. The next session will focus on working towards the established treatment goal. Brittney Ruiz will continue with her primary therapist and psychiatric provider. This provider will e-mail referral options for marriage counseling.

## 2023-03-08 ENCOUNTER — Ambulatory Visit (INDEPENDENT_AMBULATORY_CARE_PROVIDER_SITE_OTHER): Payer: Medicare HMO | Admitting: Licensed Clinical Social Worker

## 2023-03-08 DIAGNOSIS — F331 Major depressive disorder, recurrent, moderate: Secondary | ICD-10-CM

## 2023-03-08 DIAGNOSIS — F411 Generalized anxiety disorder: Secondary | ICD-10-CM | POA: Diagnosis not present

## 2023-03-08 NOTE — Progress Notes (Signed)
 Behavioral Health Counselor/Therapist Progress Note  Patient ID: Brittney Ruiz, MRN: [id number]    Date: 03/08/23  Time Spent: 103  pm - 0200 pm : 57 Minutes  Treatment Type: Individual Therapy.  Reported Symptoms: Anxiety, family stress.  Mental Status Exam: Appearance:  Casual     Behavior: Appropriate  Motor: Normal  Speech/Language:  Clear and Coherent  Affect: Appropriate  Mood: normal  Thought process: normal  Thought content:   WNL  Sensory/Perceptual disturbances:   WNL  Orientation: oriented to person, place, time/date, situation, day of week, month of year, and year  Attention: Good  Concentration: Good  Memory: WNL  Fund of knowledge:  Good  Insight:   Good  Judgment:  Good  Impulse Control: Good   Risk Assessment: Danger to Self:  No Self-injurious Behavior: No Danger to Others: No Duty to Warn:no Physical Aggression / Violence:No  Access to Firearms a concern: No  Gang Involvement:No   Subjective:   Brittney Ruiz participated from home, via video, and consented to treatment. Therapist participated from office. We met online due to patient request.  Patient presented for her session and identified her stress related to her not being able to see her grandchildren and her not having a relationship with her children. Patient shared her concern over her inability to speak to them. Patient identified that she stays emotional over this and it negatively affects her daily and she worries. Clinician encouraged patient to focus on her own well being. Clinician shared that it's important to focus on her health both physical and mental. Clinician and patient processed that giving them time and space may improve the outcome of the situation.  Interventions: Cognitive Behavioral Therapy  Diagnosis: Generalized Anxiety Disorder   Plan: Brittney Ruiz is to use CBT, mindfulness and coping skills to help manage decrease symptoms associated with their diagnosis.   Long-term  goal:   Brittney Ruiz will reduce overall level, frequency, and intensity of the feelings of depression, anxiety evidenced by       decreased irritability, negative self talk, and helpless feelings from 6 to 7 days/week to 0 to 2 days/week per client report for at least 3 consecutive months. Treatment plan to be reviewed by 11/02/2023.  Short-term goal:  Brittney Ruiz will verbally express understanding of the relationship between feelings of depression, anxiety and their impact on thinking patterns and behaviors. Verbalize an understanding of the role that distorted thinking plays in creating fears, excessive worry, and ruminations.  Phyllis Ginger MSW, LCSW DATE: 03/08/2023

## 2023-03-09 DIAGNOSIS — R0683 Snoring: Secondary | ICD-10-CM | POA: Diagnosis not present

## 2023-03-10 ENCOUNTER — Telehealth: Payer: Self-pay

## 2023-03-10 ENCOUNTER — Encounter: Payer: Medicare HMO | Admitting: Family

## 2023-03-10 NOTE — Telephone Encounter (Signed)
Patient states she has been experiencing the following symptoms since Saturday (03/06/2023) and then she was ok and then she started having the symptoms again on Sunday (03/07/2023).    Patient states she has been experiencing a migraine with distorted vision, pain over right eye and in her sinuses, tingling in fingers and palm of right hand (Saturday) and last night she had numbness and tingling in her left hand that woke her up out of a sleep study she was having at [facility].  Patient states she has a dull ache in her head and sinus drainage in her throat right now, but no numbness.  Patient states she would like to know if Rennie Plowman, FNP, wants her to still come to her appointment for her physical tomorrow.  I transferred call to Access Nurse.

## 2023-03-11 ENCOUNTER — Ambulatory Visit: Payer: Medicare HMO

## 2023-03-11 ENCOUNTER — Ambulatory Visit (INDEPENDENT_AMBULATORY_CARE_PROVIDER_SITE_OTHER): Payer: Medicare HMO | Admitting: Family

## 2023-03-11 ENCOUNTER — Encounter: Payer: Self-pay | Admitting: Family

## 2023-03-11 VITALS — BP 138/80 | HR 68 | Temp 97.9°F | Wt 211.1 lb

## 2023-03-11 DIAGNOSIS — J029 Acute pharyngitis, unspecified: Secondary | ICD-10-CM | POA: Diagnosis not present

## 2023-03-11 DIAGNOSIS — R200 Anesthesia of skin: Secondary | ICD-10-CM | POA: Diagnosis not present

## 2023-03-11 DIAGNOSIS — B9689 Other specified bacterial agents as the cause of diseases classified elsewhere: Secondary | ICD-10-CM | POA: Diagnosis not present

## 2023-03-11 DIAGNOSIS — M4312 Spondylolisthesis, cervical region: Secondary | ICD-10-CM | POA: Diagnosis not present

## 2023-03-11 DIAGNOSIS — Z1231 Encounter for screening mammogram for malignant neoplasm of breast: Secondary | ICD-10-CM | POA: Diagnosis not present

## 2023-03-11 DIAGNOSIS — M47812 Spondylosis without myelopathy or radiculopathy, cervical region: Secondary | ICD-10-CM | POA: Diagnosis not present

## 2023-03-11 DIAGNOSIS — R6889 Other general symptoms and signs: Secondary | ICD-10-CM | POA: Diagnosis not present

## 2023-03-11 DIAGNOSIS — J019 Acute sinusitis, unspecified: Secondary | ICD-10-CM | POA: Diagnosis not present

## 2023-03-11 DIAGNOSIS — M438X2 Other specified deforming dorsopathies, cervical region: Secondary | ICD-10-CM | POA: Diagnosis not present

## 2023-03-11 DIAGNOSIS — R519 Headache, unspecified: Secondary | ICD-10-CM

## 2023-03-11 DIAGNOSIS — R748 Abnormal levels of other serum enzymes: Secondary | ICD-10-CM | POA: Diagnosis not present

## 2023-03-11 LAB — POC COVID19 BINAXNOW: SARS Coronavirus 2 Ag: NEGATIVE

## 2023-03-11 LAB — POCT INFLUENZA A/B
Influenza A, POC: NEGATIVE
Influenza B, POC: NEGATIVE

## 2023-03-11 LAB — POCT RAPID STREP A (OFFICE): Rapid Strep A Screen: NEGATIVE

## 2023-03-11 MED ORDER — AMOXICILLIN-POT CLAVULANATE 875-125 MG PO TABS: 1.0000 | ORAL_TABLET | Freq: Two times a day (BID) | ORAL | 0 refills | Status: AC

## 2023-03-11 NOTE — Progress Notes (Signed)
Assessment & Plan:  Acute bacterial sinusitis Assessment & Plan: Afebrile.  Based on HPI, exam today concern for bacterial sinusitis, and concern for aggravating headache.  Start Augmentin, probiotics.  Close follow up.   Orders: -     Amoxicillin-Pot Clavulanate; Take 1 tablet by mouth 2 (two) times daily for 7 days.  Dispense: 14 tablet; Refill: 0  Sore throat -     POC COVID-19 BinaxNow -     POCT rapid strep A  Flu-like symptoms -     POCT Influenza A/B  Encounter for screening mammogram for malignant neoplasm of breast -     3D Screening Mammogram, Left and Right; Future  Acute nonintractable headache, unspecified headache type Assessment & Plan: Acute on chronic headache.  Reviewed recent MRI brain 02/04/23, history of Meningioma. She continues to follow for surveillance with neuro- ophthalmology , Dr Lenise Herald . Discussed trial of magnesium citrate for headache prevention.  Question if cervicogenic component.  She is following with physiatry. Close follow up.   Orders: -     B12 and Folate Panel -     DG Cervical Spine Complete; Future     Return precautions given.   Risks, benefits, and alternatives of the medications and treatment plan prescribed today were discussed, and patient expressed understanding.   Education regarding symptom management and diagnosis given to patient on AVS either electronically or printed.  Return in about 2 weeks (around 03/25/2023).  Rennie Plowman, FNP  Subjective:    Patient ID: Brittney Ruiz, female    DOB: May 28, 1965, 57 y.o.   MRN: [id number]  CC: Brittney Ruiz is a 57 y.o. female who presents today for an acute visit.    HPI: She complains HA   She had a HA 6 days ago, resolved with tylenol. HA returned 3 days ago. HA improved today.   Endorses left sided HA, sore throat, PND, hoarseness x 6 days  She has intermittent left or right fingers were are 'numb' for 5 minutes, resolved on its own.   She was a wedding 5 days ago and  concerned she may have become sick then.   She has h/o migraine. She has aura in one or both eyes. No vision loss. This is typical for migraine.   She taken tylenol with relief.   Recent elevation of LFTs  No fever, chills, cough   Complains cough, sore throat  She is compliant with Allegra, Flonase  She hasn't had to use budesonide nebulizer.    History of hypertension, asthma, OSA, Remote h/o smoking MRI lumbar spine 08/19/2022  L5-S1 mild bilateral facet arthropathy without stenosis. L4-5 disc bulging with moderate to severe bilateral facet arthropathy without stenosis. L3-4 mild disc bulge with mild bilateral facet arthropathy without stenosis. L2-3 mild disc bulge with mild bilateral facet arthropathy without stenosis. L1-2 is unremarkable.   MRI brain 02/04/23 Meningioma along the left anterior cranial fossa along the lateral aspect  of the olfactory groove which has minimally increased in size over multiple priors.    Updated : 03/15/23 consult with physiatry for acute on chronic low back pain; discussed conservative therapy and physical therapy.  Continue on Robaxin.      Latest Ref Rng & Units 03/11/2023    4:03 AM 02/04/2023    3:55 PM 07/30/2022   11:40 AM  Hepatic Function  Total Protein 6.0 - 8.5 g/dL 6.6  6.3  6.6   Albumin 3.8 - 4.9 g/dL 4.6  4.5  4.8  AST 0 - 40 IU/L 61  119  27   ALT 0 - 32 IU/L 77  147  39   Alk Phosphatase 44 - 121 IU/L 74  77  84   Total Bilirubin 0.0 - 1.2 mg/dL 0.4  0.5  0.4   Bilirubin, Direct 0.00 - 0.40 mg/dL 4.09       Allergies: Codeine, Elemental sulfur, Pork allergy, Pork-derived products, Aciphex [rabeprazole sodium], Antivert [meclizine hcl], Buspar [buspirone hcl], Cat hair extract, Chlorpheniramine-pseudoeph, Chlorpheniramine-pseudoeph, Chocolate flavor, Crestor [rosuvastatin calcium], Cymbalta [duloxetine hcl], Duloxetine, Effexor xr [venlafaxine hydrochloride], Epinephrine, Erythromycin, Escitalopram oxalate, Lamictal  [lamotrigine], Levofloxacin, Lipitor [atorvastatin], Meclizine, Mometasone furoate, Mometasone furoate, Monosodium glutamate, Neurontin [gabapentin], Other, Prednisone, Red yeast rice extract, Rosuvastatin, Shellfish allergy, Sudafed [pseudoephedrine hcl], Sulfa antibiotics, Sulfasalazine, Valium, Wellbutrin [bupropion hcl], Wixela inhub [fluticasone-salmeterol], Xanax xr [alprazolam], Zetia [ezetimibe], Alfalfa, Brompheniramine-phenylephrine, Buspirone, and Tape Current Outpatient Medications on File Prior to Visit  Medication Sig Dispense Refill   albuterol (VENTOLIN HFA) 108 (90 Base) MCG/ACT inhaler USE 2 INHALATIONS ORALLY   EVERY 6 HOURS AS NEEDED. 18 g 1   Alcohol Swabs (B-D SINGLE USE SWABS REGULAR) PADS USE 1 SWAB TOPICALLY TO    CLEAN FINGER BEFORE        CHECKING GLUCOSE 300 each 1   aspirin EC 81 MG tablet Take 81 mg by mouth at bedtime.     B Complex Vitamins (BL VITAMIN B COMPLEX PO) +Folic acid qd.     Blood Glucose Monitoring Suppl (TRUE METRIX METER) w/Device KIT USED TO CHECK GLUCOSE READINGS  ONE TIME DAILY AS NEEDED. 1 kit 0   clorazepate (TRANXENE) 7.5 MG tablet Take 7.5 mg by mouth 3 (three) times daily.     fexofenadine (ALLEGRA) 60 MG tablet Take 1 tablet (60 mg total) by mouth daily. (Patient taking differently: Take 180 mg by mouth daily.) 90 tablet 1   fluticasone (FLONASE) 50 MCG/ACT nasal spray USE 1 SPRAY IN EACH NOSTRILAT BEDTIME 16 g 3   glucose blood (TRUE METRIX BLOOD GLUCOSE TEST) test strip TEST BLOOD SUGAR ONE TIME DAILY 100 strip 0   Inclisiran Sodium (LEQVIO Schuyler) Inject into the skin.     Multiple Vitamin (MULTIVITAMIN) tablet Take 1 tablet by mouth daily.     mupirocin ointment (BACTROBAN) 2 % Apply 1 Application topically daily. 22 g 0   Omega-3 Fatty Acids (FISH OIL) 1000 MG CAPS Take 2 capsules by mouth daily.     omeprazole (PRILOSEC) 20 MG capsule TAKE 1 CAPSULE DAILY 90 capsule 2   polyethylene glycol powder (GLYCOLAX/MIRALAX) 17 GM/SCOOP powder Take 1  Container by mouth at bedtime.     propranolol (INDERAL) 20 MG tablet Take 1 tablet (20 mg total) by mouth 3 (three) times daily. 90 tablet 3   psyllium (METAMUCIL) 58.6 % powder Take 1 packet by mouth daily.     sertraline (ZOLOFT) 100 MG tablet TAKE 1+1/2 TABLETS BY MOUTH DAILY AS DIRECTED (Patient taking differently: Take 112.5 mg by mouth See admin instructions. 112.5 in the morning and 25 mg in the evening) 45 tablet 3   TRUEplus Lancets 33G MISC TEST BLOOD SUGAR ONE TIME DAILY 100 each 0   No current facility-administered medications on file prior to visit.    Review of Systems  Constitutional:  Negative for chills and fever.  HENT:  Positive for congestion.   Respiratory:  Positive for cough.   Cardiovascular:  Negative for chest pain and palpitations.  Gastrointestinal:  Negative for nausea and vomiting.  Objective:    BP 138/80   Pulse 68   Temp 97.9 F (36.6 C) (Temporal)   Wt 211 lb 1.9 oz (95.8 kg)   LMP  (LMP Unknown)   SpO2 96%   BMI 38.61 kg/m   BP Readings from Last 3 Encounters:  03/11/23 138/80  02/12/23 130/80  02/04/23 112/75   Wt Readings from Last 3 Encounters:  03/11/23 211 lb 1.9 oz (95.8 kg)  02/12/23 207 lb (93.9 kg)  02/04/23 205 lb (93 kg)    Physical Exam Vitals reviewed.  Constitutional:      Appearance: She is well-developed.  HENT:     Head: Normocephalic and atraumatic.     Right Ear: Hearing, tympanic membrane, ear canal and external ear normal. No decreased hearing noted. No drainage, swelling or tenderness. No middle ear effusion. No foreign body. Tympanic membrane is not erythematous or bulging.     Left Ear: Hearing, tympanic membrane, ear canal and external ear normal. No decreased hearing noted. No drainage, swelling or tenderness.  No middle ear effusion. No foreign body. Tympanic membrane is not erythematous or bulging.     Nose: No rhinorrhea.     Right Sinus: Maxillary sinus tenderness present. No frontal sinus  tenderness.     Left Sinus: Maxillary sinus tenderness present. No frontal sinus tenderness.     Mouth/Throat:     Pharynx: Uvula midline. No oropharyngeal exudate or posterior oropharyngeal erythema.     Tonsils: No tonsillar abscesses.  Eyes:     Conjunctiva/sclera: Conjunctivae normal.     Pupils: Pupils are equal, round, and reactive to light.     Comments: Fundus normal bilaterally.   Cardiovascular:     Rate and Rhythm: Normal rate and regular rhythm.     Pulses: Normal pulses.     Heart sounds: Normal heart sounds.  Pulmonary:     Effort: Pulmonary effort is normal.     Breath sounds: Normal breath sounds. No wheezing, rhonchi or rales.  Musculoskeletal:     Cervical back: No tenderness, bony tenderness or crepitus. No pain with movement.     Comments: Negative phalen's test and tinel's sign bilateral hands.   Lymphadenopathy:     Head:     Right side of head: No submental, submandibular, tonsillar, preauricular, posterior auricular or occipital adenopathy.     Left side of head: No submental, submandibular, tonsillar, preauricular, posterior auricular or occipital adenopathy.     Cervical: No cervical adenopathy.  Skin:    General: Skin is warm and dry.  Neurological:     Mental Status: She is alert.     Cranial Nerves: No cranial nerve deficit.     Sensory: No sensory deficit.     Deep Tendon Reflexes:     Reflex Scores:      Bicep reflexes are 2+ on the right side and 2+ on the left side.      Patellar reflexes are 2+ on the right side and 2+ on the left side.    Comments: Grip equal and strong bilateral upper extremities. Gait strong and steady. Able to perform rapid alternating movement without difficulty.   Psychiatric:        Speech: Speech normal.        Behavior: Behavior normal.        Thought Content: Thought content normal.

## 2023-03-11 NOTE — Patient Instructions (Addendum)
Please call  and schedule your 3D mammogram and /or bone density scan as we discussed.   [facility]  ( new location in 2023)  [address], [place], [place] [address]  [place], [place]  [phone]   Start magnesium citrate 400mg  daily.   Start augmentin as concern for sinus infection  Ensure to take probiotics while on antibiotics and also for 2 weeks after completion. This can either be by eating yogurt daily or taking a probiotic supplement over the counter such as Culturelle.It is important to re-colonize the gut with good bacteria and also to prevent any diarrheal infections associated with antibiotic use.    Please let me know how you are doing.

## 2023-03-12 LAB — HEPATIC FUNCTION PANEL
ALT: 77 [IU]/L — ABNORMAL HIGH (ref 0–32)
AST: 61 [IU]/L — ABNORMAL HIGH (ref 0–40)
Albumin: 4.6 g/dL (ref 3.8–4.9)
Alkaline Phosphatase: 74 [IU]/L (ref 44–121)
Bilirubin Total: 0.4 mg/dL (ref 0.0–1.2)
Bilirubin, Direct: 0.12 mg/dL (ref 0.00–0.40)
Total Protein: 6.6 g/dL (ref 6.0–8.5)

## 2023-03-12 LAB — B12 AND FOLATE PANEL
Folate: >24.2 ng/mL (ref >5.9)
Vitamin B-12: 568 pg/mL (ref 211–911)

## 2023-03-12 NOTE — Telephone Encounter (Signed)
Pt had appt in office on 03/11/23

## 2023-03-14 ENCOUNTER — Encounter (INDEPENDENT_AMBULATORY_CARE_PROVIDER_SITE_OTHER): Payer: Self-pay

## 2023-03-15 ENCOUNTER — Telehealth (INDEPENDENT_AMBULATORY_CARE_PROVIDER_SITE_OTHER): Payer: Medicare HMO | Admitting: Psychology

## 2023-03-15 DIAGNOSIS — M47816 Spondylosis without myelopathy or radiculopathy, lumbar region: Secondary | ICD-10-CM | POA: Diagnosis not present

## 2023-03-15 DIAGNOSIS — M5380 Other specified dorsopathies, site unspecified: Secondary | ICD-10-CM | POA: Diagnosis not present

## 2023-03-15 DIAGNOSIS — M5412 Radiculopathy, cervical region: Secondary | ICD-10-CM | POA: Diagnosis not present

## 2023-03-16 ENCOUNTER — Telehealth: Payer: Self-pay

## 2023-03-16 NOTE — Assessment & Plan Note (Addendum)
Afebrile.  Based on HPI, exam today concern for bacterial sinusitis, and concern for aggravating headache.  Start Augmentin, probiotics.  Close follow up.

## 2023-03-16 NOTE — Assessment & Plan Note (Addendum)
Acute on chronic headache.  Reviewed recent MRI brain 02/04/23, history of Meningioma. She continues to follow for surveillance with neuro- ophthalmology , Dr Lenise Herald . Discussed trial of magnesium citrate for headache prevention.  Question if cervicogenic component.  She is following with physiatry. Close follow up.

## 2023-03-16 NOTE — Telephone Encounter (Signed)
Spoke to pt and she stated that she is better than she was still not 100 % but Ha have subsided although she did have  some Vertigo that she forgot to mention.  Pt stated that she is taking propranolol 20 mg 3 times daily for Anxiety and HBP. Pt did have me cancel her 03/25/23 in office f/up  visit  and had me schedule a annual physical appt with you for 04/01/23. Pt stated that she still has 3 days of Antibiotics left before she is done

## 2023-03-17 ENCOUNTER — Other Ambulatory Visit (HOSPITAL_BASED_OUTPATIENT_CLINIC_OR_DEPARTMENT_OTHER): Payer: Self-pay | Admitting: *Deleted

## 2023-03-17 DIAGNOSIS — R748 Abnormal levels of other serum enzymes: Secondary | ICD-10-CM

## 2023-03-18 ENCOUNTER — Ambulatory Visit (INDEPENDENT_AMBULATORY_CARE_PROVIDER_SITE_OTHER): Payer: Medicare HMO | Admitting: Physician Assistant

## 2023-03-18 ENCOUNTER — Telehealth (INDEPENDENT_AMBULATORY_CARE_PROVIDER_SITE_OTHER): Payer: Self-pay

## 2023-03-19 DIAGNOSIS — M51361 Other intervertebral disc degeneration, lumbar region with lower extremity pain only: Secondary | ICD-10-CM | POA: Diagnosis not present

## 2023-03-22 ENCOUNTER — Ambulatory Visit: Payer: Medicare HMO | Admitting: Licensed Clinical Social Worker

## 2023-03-22 DIAGNOSIS — R531 Weakness: Secondary | ICD-10-CM | POA: Diagnosis not present

## 2023-03-22 DIAGNOSIS — M51361 Other intervertebral disc degeneration, lumbar region with lower extremity pain only: Secondary | ICD-10-CM | POA: Diagnosis not present

## 2023-03-22 NOTE — Telephone Encounter (Signed)
See my chart message

## 2023-03-25 ENCOUNTER — Ambulatory Visit: Payer: Medicare HMO | Admitting: Family

## 2023-03-31 ENCOUNTER — Ambulatory Visit (INDEPENDENT_AMBULATORY_CARE_PROVIDER_SITE_OTHER): Payer: Medicare HMO | Admitting: Licensed Clinical Social Worker

## 2023-03-31 DIAGNOSIS — F411 Generalized anxiety disorder: Secondary | ICD-10-CM

## 2023-03-31 NOTE — Progress Notes (Addendum)
[facility] Counselor/Therapist Progress Note  Patient ID: VUNG SCHER, MRN: [id number]    Date: 03/31/23  Time Spent: 0300  pm - 0347 pm : 47 Minutes  Treatment Type: Individual Therapy.  Reported Symptoms: Symptoms of anxiety.  Mental Status Exam: Appearance:  Casual     Behavior: Appropriate  Motor: Normal  Speech/Language:  Clear and Coherent  Affect: Appropriate  Mood: normal  Thought process: normal  Thought content:   WNL  Sensory/Perceptual disturbances:   WNL  Orientation: oriented to person, place, time/date, situation, day of week, month of year, and year  Attention: Good  Concentration: Good  Memory: WNL  Fund of knowledge:  Good  Insight:   Good  Judgment:  Good  Impulse Control: Good   Risk Assessment: Danger to Self:  No Self-injurious Behavior: No Danger to Others: No Duty to Warn:no Physical Aggression / Violence:No  Access to Firearms a concern: No  Gang Involvement:No   Subjective:   [name] participated from home, via video, and consented to treatment. Therapist participated from office. We met online due to patient request. Clinician lost connection with patient and reconnected.   Clinician met with patient via virtual visit. Patient was upset about a billing issue and Clinician encouraged patient to contact the office concerning her issue. Patient continued to vent her frustration and Clinician provided support though active listening and validation of patients feelings. Patient also shared her concern over stress in her family and things that make her sad through the [place]. Patient shared her sadness about missing her daughter and grandchildren. Patient reports that her stress and worry often overcome her but she tried to pray and ask for strength to handle the situation. Clinician actively listened and identified how faith can be a big part of the acceptance and healing process. Clinician urged patient to utilize coping skills  such as deep breathing and grounding as well as staying busy and being active in community activities such as her church that she attends.  Interventions: Cognitive Behavioral Therapy  Diagnosis: Generalized anxiety Disorder   Plan: Edwinna  is to use CBT, mindfulness and coping skills to help manage decrease symptoms associated with their diagnosis.   Long-term goal:   Shruthi will reduce overall level, frequency, and intensity of the feelings of depression, anxiety and panic evidenced by decreased irritability, negative self talk, and helpless feelings from 6 to 7 days/week to 0 to 2 days/week per client report for at least 3 consecutive months. Treatment plan to be reviewed by 01/27/2024.  Short-term goal:  Mikelle will verbally express understanding of the relationship between feelings of depression, anxiety and their impact on thinking patterns and behaviors. Verbalize an understanding of the role that distorted thinking plays in creating fears, excessive worry, and ruminations.  Phyllis Ginger MSW, LCSW DATE: 03/31/2023

## 2023-04-01 ENCOUNTER — Telehealth (INDEPENDENT_AMBULATORY_CARE_PROVIDER_SITE_OTHER): Payer: Self-pay | Admitting: Psychology

## 2023-04-01 ENCOUNTER — Other Ambulatory Visit (HOSPITAL_COMMUNITY)
Admission: RE | Admit: 2023-04-01 | Discharge: 2023-04-01 | Disposition: A | Discharge status: Home / Self Care | Payer: Medicare HMO | Source: Ambulatory Visit | Attending: Family | Admitting: Family

## 2023-04-01 ENCOUNTER — Ambulatory Visit: Payer: Medicare HMO | Admitting: Family

## 2023-04-01 VITALS — BP 124/78 | HR 84 | Temp 97.9°F | Ht 61.5 in | Wt 211.0 lb

## 2023-04-01 DIAGNOSIS — Z1151 Encounter for screening for human papillomavirus (HPV): Secondary | ICD-10-CM | POA: Insufficient documentation

## 2023-04-01 DIAGNOSIS — Z0000 Encounter for general adult medical examination without abnormal findings: Secondary | ICD-10-CM

## 2023-04-01 DIAGNOSIS — Z01419 Encounter for gynecological examination (general) (routine) without abnormal findings: Secondary | ICD-10-CM | POA: Diagnosis not present

## 2023-04-01 DIAGNOSIS — R319 Hematuria, unspecified: Secondary | ICD-10-CM | POA: Diagnosis not present

## 2023-04-01 DIAGNOSIS — Z124 Encounter for screening for malignant neoplasm of cervix: Secondary | ICD-10-CM

## 2023-04-01 DIAGNOSIS — Z23 Encounter for immunization: Secondary | ICD-10-CM

## 2023-04-01 NOTE — Telephone Encounter (Signed)
This patient called in stating since she uses [employer], she needs you to fill out some paperwork. She state to go online to [email or web address] and print out the copay reimbursement assistance form and complete. She states the form will have a FAX #.please call her because she states you will also fax billing information. Please call her after 11am. Phone number is [phone]

## 2023-04-01 NOTE — Assessment & Plan Note (Signed)
Clinical breast exam performed today.  Pap smear of the vaginal wall performed.  Patient has a history of a hysterectomy.  Exercise is limited due to ongoing back pain.  Upcoming orthopedic appointment.  Will follow.

## 2023-04-01 NOTE — Progress Notes (Incomplete)
Assessment & Plan:  Screening for cervical cancer -     Cytology - PAP -     Cervicovaginal ancillary only  Need for influenza vaccination -     Flu vaccine trivalent PF, 6mos and older(Flulaval,Afluria,Fluarix,Fluzone)  Routine physical examination Assessment & Plan: Clinical breast exam performed today.  Pap smear of the vaginal wall performed.  Patient has a history of a hysterectomy.  Exercise is limited due to ongoing back pain.  Upcoming orthopedic appointment.  Will follow.      Return precautions given.   Risks, benefits, and alternatives of the medications and treatment plan prescribed today were discussed, and patient expressed understanding.   Education regarding symptom management and diagnosis given to patient on AVS either electronically or printed.  Return in about 6 months (around 09/29/2023).  Rennie Plowman, FNP  Subjective:    Patient ID: Brittney Ruiz, female    DOB: 03/13/1966, 57 y.o.   MRN: [id number]  CC: Brittney Ruiz is a 57 y.o. female who presents today for physical exam.    HPI: She has orthopedic consult for back pain with [facility], Dr Senaida Ores  She has also noticed a foul odor in her vagina.        Colorectal Cancer Screening: UTD , Dr Tobi Bastos, repeat in 7 years  Breast Cancer Screening: Mammogram scheduled Cervical Cancer Screening: s/p partial hysterectomy per patient has fallopian tubes; ovaries intact. No h/o GYN cancer.  She prefers periodic Pap smears of the vaginal wall.          Tetanus - UTD         Exercise: No regular exercise as limited by back pain  Alcohol use:  none Smoking/tobacco use: former smoker.    Health Maintenance  Topic Date Due  . Zoster (Shingles) Vaccine (1 of 2) Never done  . COVID-19 Vaccine (5 - 2023-24 season) 01/17/2023  . Medicare Annual Wellness Visit  07/14/2023  . Mammogram  05/08/2024  . Pap with HPV screening  03/06/2025  . Colon Cancer Screening  08/04/2029  . DTaP/Tdap/Td  vaccine (5 - Td or Tdap) 08/13/2031  . Flu Shot  Completed  . Hepatitis C Screening  Completed  . HIV Screening  Completed  . HPV Vaccine  Aged Out  . Screening for Lung Cancer  Discontinued    ALLERGIES: Codeine, Elemental sulfur, Pork allergy, Pork-derived products, Aciphex [rabeprazole sodium], Antivert [meclizine hcl], Buspar [buspirone hcl], Cat hair extract, Chlorpheniramine-pseudoeph, Chlorpheniramine-pseudoeph, Chocolate flavoring agent (non-screening), Crestor [rosuvastatin calcium], Cymbalta [duloxetine hcl], Duloxetine, Effexor xr [venlafaxine hydrochloride], Epinephrine, Erythromycin, Escitalopram oxalate, Lamictal [lamotrigine], Levofloxacin, Lipitor [atorvastatin], Meclizine, Mometasone furoate, Mometasone furoate, Monosodium glutamate, Neurontin [gabapentin], Other, Prednisone, Red yeast rice extract, Rosuvastatin, Shellfish allergy, Sudafed [pseudoephedrine hcl], Sulfa antibiotics, Sulfasalazine, Valium, Wellbutrin [bupropion hcl], Wixela inhub [fluticasone-salmeterol], Xanax xr [alprazolam], Zetia [ezetimibe], Alfalfa, Brompheniramine-phenylephrine, Buspirone, and Tape  Current Outpatient Medications on File Prior to Visit  Medication Sig Dispense Refill  . albuterol (VENTOLIN HFA) 108 (90 Base) MCG/ACT inhaler USE 2 INHALATIONS ORALLY   EVERY 6 HOURS AS NEEDED. 18 g 1  . Alcohol Swabs (B-D SINGLE USE SWABS REGULAR) PADS USE 1 SWAB TOPICALLY TO    CLEAN FINGER BEFORE        CHECKING GLUCOSE 300 each 1  . aspirin EC 81 MG tablet Take 81 mg by mouth at bedtime.    . B Complex Vitamins (BL VITAMIN B COMPLEX PO) +Folic acid qd.    . Blood Glucose Monitoring Suppl (TRUE METRIX METER)  w/Device KIT USED TO CHECK GLUCOSE READINGS  ONE TIME DAILY AS NEEDED. 1 kit 0  . clorazepate (TRANXENE) 7.5 MG tablet Take 7.5 mg by mouth 3 (three) times daily.    . fexofenadine (ALLEGRA) 60 MG tablet Take 1 tablet (60 mg total) by mouth daily. (Patient taking differently: Take 180 mg by mouth daily.) 90  tablet 1  . fluticasone (FLONASE) 50 MCG/ACT nasal spray USE 1 SPRAY IN EACH NOSTRILAT BEDTIME 16 g 3  . glucose blood (TRUE METRIX BLOOD GLUCOSE TEST) test strip TEST BLOOD SUGAR ONE TIME DAILY 100 strip 0  . Inclisiran Sodium (LEQVIO [place]) Inject into the skin.    . Multiple Vitamin (MULTIVITAMIN) tablet Take 1 tablet by mouth daily.    . mupirocin ointment (BACTROBAN) 2 % Apply 1 Application topically daily. 22 g 0  . Omega-3 Fatty Acids (FISH OIL) 1000 MG CAPS Take 2 capsules by mouth daily.    Marland Kitchen omeprazole (PRILOSEC) 20 MG capsule TAKE 1 CAPSULE DAILY 90 capsule 2  . polyethylene glycol powder (GLYCOLAX/MIRALAX) 17 GM/SCOOP powder Take 1 Container by mouth at bedtime.    . propranolol (INDERAL) 20 MG tablet Take 1 tablet (20 mg total) by mouth 3 (three) times daily. 90 tablet 3  . psyllium (METAMUCIL) 58.6 % powder Take 1 packet by mouth daily.    . sertraline (ZOLOFT) 100 MG tablet TAKE 1+1/2 TABLETS BY MOUTH DAILY AS DIRECTED (Patient taking differently: Take 112.5 mg by mouth See admin instructions. 112.5 in the morning and 25 mg in the evening) 45 tablet 3  . TRUEplus Lancets 33G MISC TEST BLOOD SUGAR ONE TIME DAILY 100 each 0   No current facility-administered medications on file prior to visit.    Review of Systems  Constitutional:  Negative for chills, fever and unexpected weight change.  HENT:  Negative for congestion.   Respiratory:  Negative for cough.   Cardiovascular:  Negative for chest pain, palpitations and leg swelling.  Gastrointestinal:  Negative for nausea and vomiting.  Musculoskeletal:  Positive for back pain. Negative for arthralgias and myalgias.  Skin:  Negative for rash.  Neurological:  Negative for headaches.  Hematological:  Negative for adenopathy.  Psychiatric/Behavioral:  Negative for confusion.       Objective:    BP 124/78   Pulse 84   Temp 97.9 F (36.6 C) (Oral)   Ht 5' 1.5" (1.562 m)   Wt 211 lb (95.7 kg)   LMP  (LMP Unknown)   SpO2 96%    BMI 39.22 kg/m   BP Readings from Last 3 Encounters:  04/01/23 124/78  03/11/23 138/80  02/12/23 130/80   Wt Readings from Last 3 Encounters:  04/01/23 211 lb (95.7 kg)  03/11/23 211 lb 1.9 oz (95.8 kg)  02/12/23 207 lb (93.9 kg)    Physical Exam Vitals reviewed.  Constitutional:      Appearance: Normal appearance. She is well-developed.  Eyes:     Conjunctiva/sclera: Conjunctivae normal.  Neck:     Thyroid: No thyroid mass or thyromegaly.  Cardiovascular:     Rate and Rhythm: Normal rate and regular rhythm.     Pulses: Normal pulses.     Heart sounds: Normal heart sounds.  Pulmonary:     Effort: Pulmonary effort is normal.     Breath sounds: Normal breath sounds. No wheezing, rhonchi or rales.  Chest:  Breasts:    Breasts are symmetrical.     Right: No inverted nipple, mass, nipple discharge, skin change or tenderness.  Left: No inverted nipple, mass, nipple discharge, skin change or tenderness.  Abdominal:     General: Bowel sounds are normal. There is no distension.     Palpations: Abdomen is soft. Abdomen is not rigid. There is no fluid wave or mass.     Tenderness: There is no abdominal tenderness. There is no guarding or rebound.  Genitourinary:    Adnexa:        Right: No mass, tenderness or fullness.         Left: No mass, tenderness or fullness.       Comments: Pap performed of vaginal canal. No CMT. Unable to appreciated ovaries. Lymphadenopathy:     Head:     Right side of head: No submental, submandibular, tonsillar, preauricular, posterior auricular or occipital adenopathy.     Left side of head: No submental, submandibular, tonsillar, preauricular, posterior auricular or occipital adenopathy.     Cervical:     Right cervical: No superficial, deep or posterior cervical adenopathy.    Left cervical: No superficial, deep or posterior cervical adenopathy.     Upper Body:     Right upper body: No pectoral adenopathy.     Left upper body: No pectoral  adenopathy.  Skin:    General: Skin is warm and dry.  Neurological:     Mental Status: She is alert.  Psychiatric:        Speech: Speech normal.        Behavior: Behavior normal.        Thought Content: Thought content normal.

## 2023-04-01 NOTE — Patient Instructions (Signed)
Health Maintenance for Postmenopausal Women Menopause is a normal process in which your ability to get pregnant comes to an end. This process happens slowly over many months or years, usually between the ages of 48 and 55. Menopause is complete when you have missed your menstrual period for 12 months. It is important to talk with your health care provider about some of the most common conditions that affect women after menopause (postmenopausal women). These include heart disease, cancer, and bone loss (osteoporosis). Adopting a healthy lifestyle and getting preventive care can help to promote your health and wellness. The actions you take can also lower your chances of developing some of these common conditions. What are the signs and symptoms of menopause? During menopause, you may have the following symptoms: Hot flashes. These can be moderate or severe. Night sweats. Decrease in sex drive. Mood swings. Headaches. Tiredness (fatigue). Irritability. Memory problems. Problems falling asleep or staying asleep. Talk with your health care provider about treatment options for your symptoms. Do I need hormone replacement therapy? Hormone replacement therapy is effective in treating symptoms that are caused by menopause, such as hot flashes and night sweats. Hormone replacement carries certain risks, especially as you become older. If you are thinking about using estrogen or estrogen with progestin, discuss the benefits and risks with your health care provider. How can I reduce my risk for heart disease and stroke? The risk of heart disease, heart attack, and stroke increases as you age. One of the causes may be a change in the body's hormones during menopause. This can affect how your body uses dietary fats, triglycerides, and cholesterol. Heart attack and stroke are medical emergencies. There are many things that you can do to help prevent heart disease and stroke. Watch your blood pressure High  blood pressure causes heart disease and increases the risk of stroke. This is more likely to develop in people who have high blood pressure readings or are overweight. Have your blood pressure checked: Every 3-5 years if you are 18-39 years of age. Every year if you are 40 years old or older. Eat a healthy diet  Eat a diet that includes plenty of vegetables, fruits, low-fat dairy products, and lean protein. Do not eat a lot of foods that are high in solid fats, added sugars, or sodium. Get regular exercise Get regular exercise. This is one of the most important things you can do for your health. Most adults should: Try to exercise for at least 150 minutes each week. The exercise should increase your heart rate and make you sweat (moderate-intensity exercise). Try to do strengthening exercises at least twice each week. Do these in addition to the moderate-intensity exercise. Spend less time sitting. Even light physical activity can be beneficial. Other tips Work with your health care provider to achieve or maintain a healthy weight. Do not use any products that contain nicotine or tobacco. These products include cigarettes, chewing tobacco, and vaping devices, such as e-cigarettes. If you need help quitting, ask your health care provider. Know your numbers. Ask your health care provider to check your cholesterol and your blood sugar (glucose). Continue to have your blood tested as directed by your health care provider. Do I need screening for cancer? Depending on your health history and family history, you may need to have cancer screenings at different stages of your life. This may include screening for: Breast cancer. Cervical cancer. Lung cancer. Colorectal cancer. What is my risk for osteoporosis? After menopause, you may be   at increased risk for osteoporosis. Osteoporosis is a condition in which bone destruction happens more quickly than new bone creation. To help prevent osteoporosis or  the bone fractures that can happen because of osteoporosis, you may take the following actions: If you are 19-50 years old, get at least 1,000 mg of calcium and at least 600 international units (IU) of vitamin D per day. If you are older than age 50 but younger than age 70, get at least 1,200 mg of calcium and at least 600 international units (IU) of vitamin D per day. If you are older than age 70, get at least 1,200 mg of calcium and at least 800 international units (IU) of vitamin D per day. Smoking and drinking excessive alcohol increase the risk of osteoporosis. Eat foods that are rich in calcium and vitamin D, and do weight-bearing exercises several times each week as directed by your health care provider. How does menopause affect my mental health? Depression may occur at any age, but it is more common as you become older. Common symptoms of depression include: Feeling depressed. Changes in sleep patterns. Changes in appetite or eating patterns. Feeling an overall lack of motivation or enjoyment of activities that you previously enjoyed. Frequent crying spells. Talk with your health care provider if you think that you are experiencing any of these symptoms. General instructions See your health care provider for regular wellness exams and vaccines. This may include: Scheduling regular health, dental, and eye exams. Getting and maintaining your vaccines. These include: Influenza vaccine. Get this vaccine each year before the flu season begins. Pneumonia vaccine. Shingles vaccine. Tetanus, diphtheria, and pertussis (Tdap) booster vaccine. Your health care provider may also recommend other immunizations. Tell your health care provider if you have ever been abused or do not feel safe at home. Summary Menopause is a normal process in which your ability to get pregnant comes to an end. This condition causes hot flashes, night sweats, decreased interest in sex, mood swings, headaches, or lack  of sleep. Treatment for this condition may include hormone replacement therapy. Take actions to keep yourself healthy, including exercising regularly, eating a healthy diet, watching your weight, and checking your blood pressure and blood sugar levels. Get screened for cancer and depression. Make sure that you are up to date with all your vaccines. This information is not intended to replace advice given to you by your health care provider. Make sure you discuss any questions you have with your health care provider. Document Revised: 09/23/2020 Document Reviewed: 09/23/2020 [employer] Patient Education  2024 [employer]  

## 2023-04-02 DIAGNOSIS — R319 Hematuria, unspecified: Secondary | ICD-10-CM | POA: Diagnosis not present

## 2023-04-02 DIAGNOSIS — M51361 Other intervertebral disc degeneration, lumbar region with lower extremity pain only: Secondary | ICD-10-CM | POA: Diagnosis not present

## 2023-04-02 LAB — CERVICOVAGINAL ANCILLARY ONLY
Bacterial Vaginitis (gardnerella): NEGATIVE
Candida Glabrata: NEGATIVE
Candida Vaginitis: NEGATIVE
Comment: NEGATIVE
Comment: NEGATIVE
Comment: NEGATIVE

## 2023-04-02 LAB — URINALYSIS, ROUTINE W REFLEX MICROSCOPIC
Bilirubin Urine: NEGATIVE
Hgb urine dipstick: NEGATIVE
Ketones, ur: NEGATIVE
Leukocytes,Ua: NEGATIVE
Nitrite: NEGATIVE
RBC / HPF: NONE SEEN (ref 0–?)
Specific Gravity, Urine: 1.01 (ref 1.000–1.030)
Total Protein, Urine: NEGATIVE
Urine Glucose: NEGATIVE
Urobilinogen, UA: 0.2 (ref 0.0–1.0)
pH: 6.5 (ref 5.0–8.0)

## 2023-04-02 LAB — CYTOLOGY - PAP
Comment: NEGATIVE
Diagnosis: NEGATIVE
High risk HPV: NEGATIVE

## 2023-04-02 NOTE — Progress Notes (Signed)
Assessment & Plan:  Screening for cervical cancer -     Cytology - PAP -     Cervicovaginal ancillary only  Need for influenza vaccination -     Flu vaccine trivalent PF, 6mos and older(Flulaval,Afluria,Fluarix,Fluzone)  Routine physical examination Assessment & Plan: Clinical breast exam performed today.  Pap smear of the vaginal wall performed.  Patient has a history of a hysterectomy.  Exercise is limited due to ongoing back pain.  Upcoming orthopedic appointment.  Will follow.      Return precautions given.   Risks, benefits, and alternatives of the medications and treatment plan prescribed today were discussed, and patient expressed understanding.   Education regarding symptom management and diagnosis given to patient on AVS either electronically or printed.  Return in about 6 months (around 09/29/2023).  Rennie Plowman, FNP  Subjective:    Patient ID: Brittney Ruiz, female    DOB: 09-13-1965, 57 y.o.   MRN: [id number]  Brittney Ruiz is a 57 y.o. female who presents today for physical exam.    HPI: She has orthopedic consult for back pain with [facility], Dr Senaida Ores   She has also noticed a foul odor in her vagina.         Colorectal Cancer Screening: UTD , Dr Tobi Bastos, repeat in 7 years   Breast Cancer Screening: Mammogram scheduled Cervical Cancer Screening: s/p partial hysterectomy per patient has fallopian tubes; ovaries intact. No h/o GYN cancer.  She prefers periodic Pap smears of the vaginal wall.            Tetanus - UTD         Exercise: No regular exercise as limited by back pain  Alcohol use:  none Smoking/tobacco use: former smoker.     Health Maintenance  Topic Date Due   Zoster (Shingles) Vaccine (1 of 2) Never done   COVID-19 Vaccine (5 - 2023-24 season) 01/17/2023   Medicare Annual Wellness Visit  07/14/2023   Mammogram  05/08/2024   Pap with HPV screening  03/06/2025   Colon Cancer Screening  08/04/2029   DTaP/Tdap/Td  vaccine (5 - Td or Tdap) 08/13/2031   Flu Shot  Completed   Hepatitis C Screening  Completed   HIV Screening  Completed   HPV Vaccine  Aged Out   Screening for Lung Cancer  Discontinued    ALLERGIES: Codeine, Elemental sulfur, Pork allergy, Pork-derived products, Aciphex [rabeprazole sodium], Antivert [meclizine hcl], Buspar [buspirone hcl], Cat hair extract, Chlorpheniramine-pseudoeph, Chlorpheniramine-pseudoeph, Chocolate flavoring agent (non-screening), Crestor [rosuvastatin calcium], Cymbalta [duloxetine hcl], Duloxetine, Effexor xr [venlafaxine hydrochloride], Epinephrine, Erythromycin, Escitalopram oxalate, Lamictal [lamotrigine], Levofloxacin, Lipitor [atorvastatin], Meclizine, Mometasone furoate, Mometasone furoate, Monosodium glutamate, Neurontin [gabapentin], Other, Prednisone, Red yeast rice extract, Rosuvastatin, Shellfish allergy, Sudafed [pseudoephedrine hcl], Sulfa antibiotics, Sulfasalazine, Valium, Wellbutrin [bupropion hcl], Wixela inhub [fluticasone-salmeterol], Xanax xr [alprazolam], Zetia [ezetimibe], Alfalfa, Brompheniramine-phenylephrine, Buspirone, and Tape  Current Outpatient Medications on File Prior to Visit  Medication Sig Dispense Refill   albuterol (VENTOLIN HFA) 108 (90 Base) MCG/ACT inhaler USE 2 INHALATIONS ORALLY   EVERY 6 HOURS AS NEEDED. 18 g 1   Alcohol Swabs (B-D SINGLE USE SWABS REGULAR) PADS USE 1 SWAB TOPICALLY TO    CLEAN FINGER BEFORE        CHECKING GLUCOSE 300 each 1   aspirin EC 81 MG tablet Take 81 mg by mouth at bedtime.     B Complex Vitamins (BL VITAMIN B COMPLEX PO) +Folic acid qd.     Blood  Glucose Monitoring Suppl (TRUE METRIX METER) w/Device KIT USED TO CHECK GLUCOSE READINGS  ONE TIME DAILY AS NEEDED. 1 kit 0   clorazepate (TRANXENE) 7.5 MG tablet Take 7.5 mg by mouth 3 (three) times daily.     fexofenadine (ALLEGRA) 60 MG tablet Take 1 tablet (60 mg total) by mouth daily. (Patient taking differently: Take 180 mg by mouth daily.) 90 tablet 1    fluticasone (FLONASE) 50 MCG/ACT nasal spray USE 1 SPRAY IN EACH NOSTRILAT BEDTIME 16 g 3   glucose blood (TRUE METRIX BLOOD GLUCOSE TEST) test strip TEST BLOOD SUGAR ONE TIME DAILY 100 strip 0   Inclisiran Sodium (LEQVIO [place]) Inject into the skin.     Multiple Vitamin (MULTIVITAMIN) tablet Take 1 tablet by mouth daily.     mupirocin ointment (BACTROBAN) 2 % Apply 1 Application topically daily. 22 g 0   Omega-3 Fatty Acids (FISH OIL) 1000 MG CAPS Take 2 capsules by mouth daily.     omeprazole (PRILOSEC) 20 MG capsule TAKE 1 CAPSULE DAILY 90 capsule 2   polyethylene glycol powder (GLYCOLAX/MIRALAX) 17 GM/SCOOP powder Take 1 Container by mouth at bedtime.     propranolol (INDERAL) 20 MG tablet Take 1 tablet (20 mg total) by mouth 3 (three) times daily. 90 tablet 3   psyllium (METAMUCIL) 58.6 % powder Take 1 packet by mouth daily.     sertraline (ZOLOFT) 100 MG tablet TAKE 1+1/2 TABLETS BY MOUTH DAILY AS DIRECTED (Patient taking differently: Take 112.5 mg by mouth See admin instructions. 112.5 in the morning and 25 mg in the evening) 45 tablet 3   TRUEplus Lancets 33G MISC TEST BLOOD SUGAR ONE TIME DAILY 100 each 0   No current facility-administered medications on file prior to visit.    Review of Systems  Constitutional:  Negative for chills and fever.  Respiratory:  Negative for cough.   Cardiovascular:  Negative for chest pain and palpitations.  Gastrointestinal:  Negative for nausea and vomiting.  Genitourinary:  Positive for vaginal discharge. Negative for pelvic pain.      Objective:    BP 124/78   Pulse 84   Temp 97.9 F (36.6 C) (Oral)   Ht 5' 1.5" (1.562 m)   Wt 211 lb (95.7 kg)   LMP  (LMP Unknown)   SpO2 96%   BMI 39.22 kg/m   BP Readings from Last 3 Encounters:  04/01/23 124/78  03/11/23 138/80  02/12/23 130/80   Wt Readings from Last 3 Encounters:  04/01/23 211 lb (95.7 kg)  03/11/23 211 lb 1.9 oz (95.8 kg)  02/12/23 207 lb (93.9 kg)    Physical Exam Vitals  reviewed.  Constitutional:      Appearance: Normal appearance. She is well-developed.  Eyes:     Conjunctiva/sclera: Conjunctivae normal.  Neck:     Thyroid: No thyroid mass or thyromegaly.  Cardiovascular:     Rate and Rhythm: Normal rate and regular rhythm.     Pulses: Normal pulses.     Heart sounds: Normal heart sounds.  Pulmonary:     Effort: Pulmonary effort is normal.     Breath sounds: Normal breath sounds. No wheezing, rhonchi or rales.  Chest:  Breasts:    Breasts are symmetrical.     Right: No inverted nipple, mass, nipple discharge, skin change or tenderness.     Left: No inverted nipple, mass, nipple discharge, skin change or tenderness.  Abdominal:     General: Bowel sounds are normal. There is no distension.  Palpations: Abdomen is soft. Abdomen is not rigid. There is no fluid wave or mass.     Tenderness: There is no abdominal tenderness. There is no guarding or rebound.  Genitourinary:    Cervix: No cervical motion tenderness, discharge or friability.     Uterus: Not enlarged, not fixed and not tender.      Adnexa:        Right: No mass, tenderness or fullness.         Left: No mass, tenderness or fullness.       Comments: Pap performed. No CMT. Unable to appreciated ovaries. Lymphadenopathy:     Head:     Right side of head: No submental, submandibular, tonsillar, preauricular, posterior auricular or occipital adenopathy.     Left side of head: No submental, submandibular, tonsillar, preauricular, posterior auricular or occipital adenopathy.     Cervical:     Right cervical: No superficial, deep or posterior cervical adenopathy.    Left cervical: No superficial, deep or posterior cervical adenopathy.     Upper Body:     Right upper body: No pectoral adenopathy.     Left upper body: No pectoral adenopathy.  Skin:    General: Skin is warm and dry.  Neurological:     Mental Status: She is alert.  Psychiatric:        Speech: Speech normal.         Behavior: Behavior normal.        Thought Content: Thought content normal.

## 2023-04-02 NOTE — Addendum Note (Signed)
Addended by: [place], Terris Germano on: 04/02/2023 07:48 AM   Modules accepted: Orders

## 2023-04-03 ENCOUNTER — Other Ambulatory Visit: Payer: Self-pay | Admitting: Family

## 2023-04-04 LAB — URINE CULTURE
MICRO NUMBER:: 15737004
Result:: NO GROWTH
SPECIMEN QUALITY:: ADEQUATE

## 2023-04-06 ENCOUNTER — Encounter (HOSPITAL_BASED_OUTPATIENT_CLINIC_OR_DEPARTMENT_OTHER): Payer: Self-pay | Admitting: Internal Medicine

## 2023-04-07 NOTE — Telephone Encounter (Signed)
Call to patient to identify which clinic the message came from.  Patient is asking if we can complete a copay assistance form and fax it back to [facility] for her clinic copays.  Form has been printed and will be completed.

## 2023-04-07 NOTE — Telephone Encounter (Signed)
Brittney Ruiz, Thanks for the update.I will let our scheduling team know.   [name]

## 2023-04-07 NOTE — Telephone Encounter (Signed)
[facility] identification number:  [id number]

## 2023-04-08 ENCOUNTER — Encounter: Payer: Self-pay | Admitting: Dermatology

## 2023-04-08 ENCOUNTER — Ambulatory Visit (INDEPENDENT_AMBULATORY_CARE_PROVIDER_SITE_OTHER): Payer: Medicare HMO | Admitting: Dermatology

## 2023-04-08 DIAGNOSIS — L821 Other seborrheic keratosis: Secondary | ICD-10-CM

## 2023-04-08 DIAGNOSIS — L814 Other melanin hyperpigmentation: Secondary | ICD-10-CM

## 2023-04-08 DIAGNOSIS — D2372 Other benign neoplasm of skin of left lower limb, including hip: Secondary | ICD-10-CM | POA: Diagnosis not present

## 2023-04-08 DIAGNOSIS — D229 Melanocytic nevi, unspecified: Secondary | ICD-10-CM

## 2023-04-08 DIAGNOSIS — D492 Neoplasm of unspecified behavior of bone, soft tissue, and skin: Secondary | ICD-10-CM

## 2023-04-08 DIAGNOSIS — L304 Erythema intertrigo: Secondary | ICD-10-CM | POA: Diagnosis not present

## 2023-04-08 DIAGNOSIS — Z85820 Personal history of malignant melanoma of skin: Secondary | ICD-10-CM

## 2023-04-08 DIAGNOSIS — Z1283 Encounter for screening for malignant neoplasm of skin: Secondary | ICD-10-CM

## 2023-04-08 DIAGNOSIS — L578 Other skin changes due to chronic exposure to nonionizing radiation: Secondary | ICD-10-CM | POA: Diagnosis not present

## 2023-04-08 DIAGNOSIS — W908XXA Exposure to other nonionizing radiation, initial encounter: Secondary | ICD-10-CM

## 2023-04-08 DIAGNOSIS — D1801 Hemangioma of skin and subcutaneous tissue: Secondary | ICD-10-CM | POA: Diagnosis not present

## 2023-04-08 DIAGNOSIS — D329 Benign neoplasm of meninges, unspecified: Secondary | ICD-10-CM | POA: Diagnosis not present

## 2023-04-08 MED ORDER — TERBINAFINE HCL 1 % EX CREA: TOPICAL_CREAM | CUTANEOUS | 3 refills | Status: DC

## 2023-04-08 NOTE — Patient Instructions (Addendum)
Recommend daily broad spectrum sunscreen SPF 30+ to sun-exposed areas, reapply every 2 hours as needed. Call for new or changing lesions.  Staying in the shade or wearing long sleeves, sun glasses (UVA+UVB protection) and wide brim hats (4-inch brim around the entire circumference of the hat) are also recommended for sun protection.    TERBINAFINE 1.7%/ITRACON 1%/KETOCON 1%/CLOTREM 1%/UNDECLYENIC IN TEA TREE OIL 10%DMSO NAIL cream apply once or twice daily to skin folds.   [facility]  [address] [place], [place] [address]  Phone: [phone]   Recommend Zeasorb powder daily as maintenance.     Basic OTC daily skin care regimen to prevent photoaging:   Recommend facial moisturizer with sunscreen SPF 30 every morning (OTC brands include CeraVe AM, Neutrogena, Eucerin, Cetaphil, Aveeno, La Roche Posay).  Can also apply a topical Vit C serum which is an antioxidant (OTC brands include CeraVe, La Roche Posay, and The Ordinary) underneath sunscreen in morning. If you are outside during the day in the summer for extended periods, especially swimming and/or sweating, make sure you apply a water resistant facial sunscreen lotion spf 30 or higher.   At night recommend a cream with retinol (a vitamin A derivative which stimulates collagen production) like CeraVe skin renewing retinol serum or ROC retinol correxion cream or Neutrogena rapid wrinkle repair cream. Retinol may cause skin irritation in people with sensitive skin.  Can use it every other day and/or apply on top of a hyaluronic acid (HA) moisturizer/serum (Neutrogena Hydroboost water cream) if better tolerated that way.  Retinol may also help with lightening brown spots.   Our office sells high quality, medically tested skin care lines such as Elta MD sunscreens (with Zinc), and Alastin skin care products, which are very effective in treating photoaging. The Alastin line includes cosmeceutical grade Vit.C serum, HA serum,  Elastin stimulating moisturizers/serums, lightening serum, and sunscreens.  If you want prescription treatment, then you would need an appointment (Rx tretinoin and fade creams, Botox, filler injections, laser treatments, etc.) These prescriptions and procedures are not covered by insurance but work very well.       Melanoma ABCDEs  Melanoma is the most dangerous type of skin cancer, and is the leading cause of death from skin disease.  You are more likely to develop melanoma if you: Have light-colored skin, light-colored eyes, or red or blond hair Spend a lot of time in the sun Tan regularly, either outdoors or in a tanning bed Have had blistering sunburns, especially during childhood Have a close family member who has had a melanoma Have atypical moles or large birthmarks  Early detection of melanoma is key since treatment is typically straightforward and cure rates are extremely high if we catch it early.   The first sign of melanoma is often a change in a mole or a new dark spot.  The ABCDE system is a way of remembering the signs of melanoma.  A for asymmetry:  The two halves do not match. B for border:  The edges of the growth are irregular. C for color:  A mixture of colors are present instead of an even brown color. D for diameter:  Melanomas are usually (but not always) greater than 6mm - the size of a pencil eraser. E for evolution:  The spot keeps changing in size, shape, and color.  Please check your skin once per month between visits. You can use a small mirror in front and a large mirror behind you to keep an eye on  the back side or your body.   If you see any new or changing lesions before your next follow-up, please call to schedule a visit.  Please continue daily skin protection including broad spectrum sunscreen SPF 30+ to sun-exposed areas, reapplying every 2 hours as needed when you're outdoors.   Staying in the shade or wearing long sleeves, sun glasses  (UVA+UVB protection) and wide brim hats (4-inch brim around the entire circumference of the hat) are also recommended for sun protection.      Due to recent changes in healthcare laws, you may see results of your pathology and/or laboratory studies on MyChart before the doctors have had a chance to review them. We understand that in some cases there may be results that are confusing or concerning to you. Please understand that not all results are received at the same time and often the doctors may need to interpret multiple results in order to provide you with the best plan of care or course of treatment. Therefore, we ask that you please give [name] 2 business days to thoroughly review all your results before contacting the office for clarification. Should we see a critical lab result, you will be contacted sooner.   If You Need Anything After Your Visit  If you have any questions or concerns for your doctor, please call our main line at [phone] and press option 4 to reach your doctor's medical assistant. If no one answers, please leave a voicemail as directed and we will return your call as soon as possible. Messages left after 4 pm will be answered the following business day.   You may also send [name] a message via MyChart. We typically respond to MyChart messages within 1-2 business days.  For prescription refills, please ask your pharmacy to contact our office. Our fax number is [phone].  If you have an urgent issue when the clinic is closed that cannot wait until the next business day, you can page your doctor at the number below.    Please note that while we do our best to be available for urgent issues outside of office hours, we are not available 24/7.   If you have an urgent issue and are unable to reach [name], you may choose to seek medical care at your doctor's office, retail clinic, urgent care center, or emergency room.  If you have a medical emergency, please immediately call 911  or go to the emergency department.  Pager Numbers  - Dr. Gwen Pounds: [phone]  - Dr. Roseanne Reno: [phone]  - Dr. Katrinka Blazing: [phone]   In the event of inclement weather, please call our main line at [phone] for an update on the status of any delays or closures.  Dermatology Medication Tips: Please keep the boxes that topical medications come in in order to help keep track of the instructions about where and how to use these. Pharmacies typically print the medication instructions only on the boxes and not directly on the medication tubes.   If your medication is too expensive, please contact our office at [phone] option 4 or send [name] a message through MyChart.   We are unable to tell what your co-pay for medications will be in advance as this is different depending on your insurance coverage. However, we may be able to find a substitute medication at lower cost or fill out paperwork to get insurance to cover a needed medication.   If a prior authorization is required to get your medication covered by your insurance company,  please allow [name] 1-2 business days to complete this process.  Drug prices often vary depending on where the prescription is filled and some pharmacies may offer cheaper prices.  The website www.goodrx.com contains coupons for medications through different pharmacies. The prices here do not account for what the cost may be with help from insurance (it may be cheaper with your insurance), but the website can give you the price if you did not use any insurance.  - You can print the associated coupon and take it with your prescription to the pharmacy.  - You may also stop by our office during regular business hours and pick up a GoodRx coupon card.  - If you need your prescription sent electronically to a different pharmacy, notify our office through [facility] or by phone at [phone] option 4.     Si Usted Necesita Algo Despus de Su  Visita  Tambin puede enviarnos un mensaje a travs de Clinical cytogeneticist. Por lo general respondemos a los mensajes de MyChart en el transcurso de 1 a 2 das hbiles.  Para renovar recetas, por favor pida a su farmacia que se ponga en contacto con nuestra oficina. Annie Sable de fax es [place] [phone].  Si tiene un asunto urgente cuando la clnica est cerrada y que no puede esperar hasta el siguiente da hbil, puede llamar/localizar a su doctor(a) al nmero que aparece a continuacin.   Por favor, tenga en cuenta que aunque hacemos todo lo posible para estar disponibles para asuntos urgentes fuera del horario de [place], no estamos disponibles las 24 horas del da, los 7 [address]  [address] de la [place].   Si tiene un problema urgente y no puede comunicarse con nosotros, puede optar por buscar atencin mdica  en el consultorio de su doctor(a), en una clnica privada, en un centro de atencin urgente o en una sala de emergencias.  Si tiene Engineer, drilling, por favor llame inmediatamente al 911 o vaya a la sala de emergencias.  Nmeros de bper  - Dr. Gwen Pounds: [phone]  - Dra. Roseanne Reno: [phone]  - Dr. Katrinka Blazing: [phone]   En caso de inclemencias del tiempo, por favor llame a Lacy Duverney principal al [phone] para una actualizacin sobre el [place] de cualquier retraso o cierre.  Consejos para la medicacin en dermatologa: Por favor, guarde las cajas en las que vienen los medicamentos de uso tpico para ayudarle a seguir las instrucciones sobre dnde y cmo usarlos. Las farmacias generalmente imprimen las instrucciones del medicamento slo en las cajas y no directamente en los tubos del [place].   Si su medicamento es muy caro, por favor, pngase en contacto con Rolm [place] llamando al [phone] y presione la opcin 4 o envenos un mensaje a travs de Clinical cytogeneticist.   No podemos decirle cul ser su copago por los medicamentos por adelantado ya que esto es diferente dependiendo de  la cobertura de su seguro. Sin embargo, es posible que podamos encontrar un medicamento sustituto a Audiological scientist un formulario para que el seguro cubra el medicamento que se considera necesario.   Si se requiere una autorizacin previa para que su compaa de seguros [place] su medicamento, por favor permtanos de 1 a 2 das hbiles para completar [address]  Los precios de los medicamentos varan con frecuencia dependiendo del Environmental consultant de dnde se surte la receta y alguna farmacias pueden ofrecer precios ms baratos.  El sitio web www.goodrx.com tiene cupones para medicamentos de Health and safety inspector. Los precios aqu no tienen en cuenta  lo que podra costar con la ayuda del seguro (puede ser ms barato con su seguro), pero el sitio web puede darle el precio si no Visual merchandiser.  - Puede imprimir el cupn correspondiente y llevarlo con su receta a la farmacia.  - Tambin puede pasar por nuestra oficina durante el horario de atencin regular y Education officer, museum una tarjeta de cupones de GoodRx.  - Si necesita que su receta se enve electrnicamente a una farmacia diferente, informe a nuestra oficina a travs de MyChart de [place] o por telfono llamando al [phone] y presione la opcin 4.

## 2023-04-08 NOTE — Progress Notes (Signed)
Follow-Up Visit   Subjective  Brittney Ruiz is a 57 y.o. female who presents for the following: Skin Cancer Screening and Full Body Skin Exam. HxMM, Clark's level IV, Breslow's 1.1 mm.    Hx of intertrigo at breast folds. Has used HC 2.5% cream and Ketoconazole in the past. Helps when she uses it but rash comes back.   The patient presents for Total-Body Skin Exam (TBSE) for skin cancer screening and mole check. The patient has spots, moles and lesions to be evaluated, some may be new or changing and the patient may have concern these could be cancer.    The following portions of the chart were reviewed this encounter and updated as appropriate: medications, allergies, medical history  Review of Systems:  No other skin or systemic complaints except as noted in HPI or Assessment and Plan.  Objective  Well appearing patient in no apparent distress; mood and affect are within normal limits.  A full examination was performed including scalp, head, eyes, ears, nose, lips, neck, chest, axillae, abdomen, back, buttocks, bilateral upper extremities, bilateral lower extremities, hands, feet, fingers, toes, fingernails, and toenails. All findings within normal limits unless otherwise noted below.   Relevant physical exam findings are noted in the Assessment and Plan.  Left Lower Leg - Anterior 6 mm gray violaceous macule with subQ induration       Exam of nails limited by presence of nail polish.   Assessment & Plan   HISTORY OF MELANOMA. Right dorsal foot. Excised 2002. Sentinel node biopsy at right groin, no evidence of metastatic disease. [facility], Dr. Tama Gander. Breslow's 1.1 mm. Clark's level IV. - No evidence of recurrence today - No lymphadenopathy - Recommend regular full body skin exams - Recommend daily broad spectrum sunscreen SPF 30+ to sun-exposed areas, reapply every 2 hours as needed.  - Call if any new or changing lesions are noted between office visits   SKIN  CANCER SCREENING PERFORMED TODAY.   ACTINIC DAMAGE - Chronic condition, secondary to cumulative UV/sun exposure - diffuse scaly erythematous macules with underlying dyspigmentation - Recommend daily broad spectrum sunscreen SPF 30+ to sun-exposed areas, reapply every 2 hours as needed.  - Staying in the shade or wearing long sleeves, sun glasses (UVA+UVB protection) and wide brim hats (4-inch brim around the entire circumference of the hat) are also recommended for sun protection.  - Call for new or changing lesions.  LENTIGINES, SEBORRHEIC KERATOSES, HEMANGIOMAS - Benign normal skin lesions - Benign-appearing - Call for any changes  MELANOCYTIC NEVI - Tan-brown and/or pink-flesh-colored symmetric macules and papules - Benign appearing on exam today - Observation - Call clinic for new or changing moles - Recommend daily use of broad spectrum spf 30+ sunscreen to sun-exposed areas.  - Check nails when remove polish.   INTERTRIGO Exam Erythematous macerated patches of inframammary skin  Chronic and persistent condition with duration or expected duration over one year. Condition is symptomatic/ bothersome to patient. Not currently at goal.   Intertrigo is a chronic recurrent rash that occurs in skin fold areas that may be associated with friction; heat; moisture; yeast; fungus; and bacteria.  It is exacerbated by increased movement / activity; sweating; and higher atmospheric temperature.  Treatment Plan TERBINAFINE 1.7%/ITRACON 1%/KETOCON 1%/CLOTREM 1%/UNDECLYENIC IN TEA TREE OIL 10%DMSO NAIL cream apply once or twice daily to skin folds.  Recommend Zeasorb powder daily as maintenance.     Neoplasm of skin Left Lower Leg - Anterior  Skin / nail  biopsy Type of biopsy: tangential   Informed consent: discussed and consent obtained   Timeout: patient name, date of birth, surgical site, and procedure verified   Procedure prep:  Patient was prepped and draped in usual sterile  fashion Prep type:  Isopropyl alcohol Anesthesia: the lesion was anesthetized in a standard fashion   Anesthetic:  1% lidocaine w/ epinephrine 1-100,000 buffered w/ 8.4% NaHCO3 Instrument used: DermaBlade   Hemostasis achieved with: pressure and aluminum chloride   Outcome: patient tolerated procedure well   Post-procedure details: sterile dressing applied and wound care instructions given   Dressing type: bandage and petrolatum    Specimen 1 - Surgical pathology Differential Diagnosis: dermatofibroma, rule out melanoma  Check Margins: No  Seborrheic keratoses  Actinic elastosis  Lentigines  Multiple benign nevi  Cherry angioma  Erythema intertrigo   Return in about 1 year (around 04/07/2024) for TBSE, HxMM.  I, Lawson Radar, CMA, am acting as scribe for Elie Goody, MD.   Documentation: I have reviewed the above documentation for accuracy and completeness, and I agree with the above.  Elie Goody, MD

## 2023-04-13 ENCOUNTER — Ambulatory Visit (INDEPENDENT_AMBULATORY_CARE_PROVIDER_SITE_OTHER): Payer: Medicare HMO | Admitting: Licensed Clinical Social Worker

## 2023-04-13 DIAGNOSIS — F411 Generalized anxiety disorder: Secondary | ICD-10-CM | POA: Diagnosis not present

## 2023-04-13 DIAGNOSIS — F509 Eating disorder, unspecified: Secondary | ICD-10-CM

## 2023-04-13 DIAGNOSIS — F331 Major depressive disorder, recurrent, moderate: Secondary | ICD-10-CM

## 2023-04-13 DIAGNOSIS — F329 Major depressive disorder, single episode, unspecified: Secondary | ICD-10-CM

## 2023-04-13 NOTE — Progress Notes (Signed)
[facility] Counselor/Therapist Progress Note  Patient ID: Brittney Ruiz, MRN: [id number]    Date: 04/13/23  Time Spent: 1100  am - 1145 am : 45 Minutes  Treatment Type: Individual Therapy.  Reported Symptoms: Anxiety, depression, medical issues, weight issues  Mental Status Exam: Appearance:  Casual     Behavior: Appropriate  Motor: Normal  Speech/Language:  Clear and Coherent  Affect: Appropriate  Mood: normal  Thought process: normal  Thought content:   WNL  Sensory/Perceptual disturbances:   WNL  Orientation: oriented to person, place, time/date, situation, day of week, month of year, and year  Attention: Good  Concentration: Good  Memory: WNL  Fund of knowledge:  Good  Insight:   Good  Judgment:  Good  Impulse Control: Good   Risk Assessment: Danger to Self:  No Self-injurious Behavior: No Danger to Others: No Duty to Warn:no Physical Aggression / Violence:No  Access to Firearms a concern: No  Gang Involvement:No   Subjective:   Brittney Ruiz participated from home, via video, and consented to treatment. Therapist participated from office. We met online due to patient preference.    Patient reported her provider suggesting she increase her Zoloft. Patient reported she followed her providers orders and increased the medication but felt as though she was less moody and tearful. She stated that she feels that her mood is improving. Patient reported continued medical concerns and shared several of her conditions including an upcoming possible surgery on her back at [facility]. Patient reports that she is feeling more motivated and is working on healthy eating but gets upset that it doesn't appear to be helping.   Interventions:  Supportive therapy and problem solving.  Clinician conducted session via caregility video from clinician's  office located at [facility]. Patient provided verbal consent to proceed with telehealth session and is aware of limitations  of telephone or video visits. Patient participated in session from patient's home.  Clinician provided supportive therapy, active listening, and validation as patient discussed health concerns and improvement in mood.    Symptom reduction: Decrease the frequency and intensity of depressive symptoms like sadness, hopelessness, fatigue, and loss of interest in activities.  Identify and manage specific triggers that worsen symptoms.  Functional improvement: Enhance daily living skills like hygiene, meal preparation, and maintaining a regular sleep schedule.  Improve engagement in social activities and relationships.  Maintain work or school responsibilities to a reasonable level.  Cognitive changes: Challenge negative thought patterns and replace them with more positive thinking.  Develop coping mechanisms to manage stress and difficult emotions.  Self-care practices: Regular physical exercise  Healthy eating habits  Sufficient sleep hygiene  Relaxation techniques like meditation or deep breathing  Short-term goals          Attend therapy sessions consistently.  Identify and list 3 positive activities to engage in daily. Increase sleep duration by 30 minutes per night.  Practice relaxation techniques for 10 minutes daily.   Reduce depressive symptom severity by 50% on a rating scale.  Engage in one social activity per week with friends or family.  Begin to identify and challenge negative thoughts.  Increase physical activity to 30 minutes most days.  Long-term goals  Achieve sustained remission from depressive and anxiety symptoms.  Maintain a healthy lifestyle with regular exercise and balanced diet.  Develop a strong support network to manage stress and challenges.  Effectively manage potential relapse Treatment plan to be reviewed by 01/27/2024.    Interventions: Cognitive Behavioral  Therapy  Diagnosis: Generalized Anxiety Disorder, Major Depressive Disorder, eating disorder  unspecified     Phyllis Ginger MSW, LCSW DATE: 04/13/2023

## 2023-04-14 LAB — SURGICAL PATHOLOGY

## 2023-04-21 DIAGNOSIS — M5030 Other cervical disc degeneration, unspecified cervical region: Secondary | ICD-10-CM | POA: Diagnosis not present

## 2023-04-21 DIAGNOSIS — M542 Cervicalgia: Secondary | ICD-10-CM | POA: Diagnosis not present

## 2023-04-21 DIAGNOSIS — M5136 Other intervertebral disc degeneration, lumbar region with discogenic back pain only: Secondary | ICD-10-CM | POA: Diagnosis not present

## 2023-04-21 DIAGNOSIS — M4712 Other spondylosis with myelopathy, cervical region: Secondary | ICD-10-CM | POA: Diagnosis not present

## 2023-04-21 DIAGNOSIS — M21161 Varus deformity, not elsewhere classified, right knee: Secondary | ICD-10-CM | POA: Diagnosis not present

## 2023-04-21 DIAGNOSIS — M4316 Spondylolisthesis, lumbar region: Secondary | ICD-10-CM | POA: Diagnosis not present

## 2023-04-21 DIAGNOSIS — M21162 Varus deformity, not elsewhere classified, left knee: Secondary | ICD-10-CM | POA: Diagnosis not present

## 2023-04-21 DIAGNOSIS — M4802 Spinal stenosis, cervical region: Secondary | ICD-10-CM | POA: Diagnosis not present

## 2023-04-21 DIAGNOSIS — M47816 Spondylosis without myelopathy or radiculopathy, lumbar region: Secondary | ICD-10-CM | POA: Diagnosis not present

## 2023-04-28 DIAGNOSIS — H35033 Hypertensive retinopathy, bilateral: Secondary | ICD-10-CM | POA: Diagnosis not present

## 2023-04-28 DIAGNOSIS — Z0100 Encounter for examination of eyes and vision without abnormal findings: Secondary | ICD-10-CM | POA: Diagnosis not present

## 2023-04-28 DIAGNOSIS — H524 Presbyopia: Secondary | ICD-10-CM | POA: Diagnosis not present

## 2023-05-03 ENCOUNTER — Telehealth (INDEPENDENT_AMBULATORY_CARE_PROVIDER_SITE_OTHER): Payer: Medicare HMO | Admitting: Psychology

## 2023-05-03 DIAGNOSIS — F419 Anxiety disorder, unspecified: Secondary | ICD-10-CM

## 2023-05-03 DIAGNOSIS — F5089 Other specified eating disorder: Secondary | ICD-10-CM | POA: Diagnosis not present

## 2023-05-03 DIAGNOSIS — F32A Depression, unspecified: Secondary | ICD-10-CM | POA: Diagnosis not present

## 2023-05-03 NOTE — Progress Notes (Signed)
  Office: [phone]  /  Fax: [phone]    Date: May 03, 2023  Appointment Start Time: 2:30pm Duration: 30 minutes Provider: Lawerance Cruel, Psy.D. Type of Session: Individual Therapy  Location of Patient: In car (address obtained; safe/private location) Location of Provider: Provider's Home (private office) Type of Contact: Telepsychological Visit via MyChart Video Visit  Session Content: Danis is a 57 y.o. female presenting for a follow-up appointment to address the previously established treatment goal of increasing coping skills.Today's appointment was a telepsychological visit. Mariaines provided verbal consent for today's telepsychological appointment and she is aware she is responsible for securing confidentiality on her end of the session. Prior to proceeding with today's appointment, Prezley's physical location at the time of this appointment was obtained as well a phone number she could be reached at in the event of technical difficulties. Julita and this provider participated in today's telepsychological service.   Of note, Analysia stated her husband, Sandrina Strnad, was present in the car. Limits of confidentiality were discussed. Hayla acknowledged understanding and provided verbal consent to proceed.   This provider conducted a brief check-in. Atonya shared, "It has been a roller coaster. Chaotic mess." She explained it is due to family issues, noting she has discussed the aforementioned with her primary therapist. Additionally, Brexlee stated she will have her IC repeated tomorrow due to limited weight loss. Recent eating habits reviewed. It was reflected she is not eating enough, especially protein. She also disclosed challenges with journaling. Psychoeducation regarding SMART goals was provided and Maelani was engaged in goal setting. The following goal was established: Cabria will journal using MyFitnessPal at least 3 out of 7 days between now and the next appointment with this provider. Of note, a  risk assessment was completed. Myana denied experiencing suicidal, self-harm and homicidal ideation, plan, and intent since the last appointment with this provider. She continues to acknowledge understanding regarding the importance of reaching out to trusted individuals and/or emergency resources if she is unable to ensure safety. Overall, Aurel was receptive to today's appointment as evidenced by openness to sharing, responsiveness to feedback, and willingness to implement discussed strategies .  Mental Status Examination:  Appearance: neat Behavior: appropriate to circumstances Mood: depressed Affect: mood congruent Speech: WNL Eye Contact: appropriate Psychomotor Activity: WNL Gait: unable to assess Thought Process: linear, logical, and goal directed and denies suicidal, homicidal, and self-harm ideation, plan and intent  Thought Content/Perception: no hallucinations, delusions, bizarre thinking or behavior endorsed or observed Orientation: AAOx4 Memory/Concentration: intact Insight: fair Judgment: fair  Interventions:  Conducted a brief chart review Provided empathic reflections and validation Employed supportive psychotherapy interventions to facilitate reduced distress and to improve coping skills with identified stressors Engaged patient in goal setting Psychoeducation provided regarding SMART goals Conducted a risk assessment   DSM-5 Diagnosis(es):  F50.89 Other Specified Feeding or Eating Disorder, Emotional Eating Behaviors, F41.9 Unspecified Anxiety Disorder, and  F32.A Unspecified Depressive Disorder  Treatment Goal & Progress: During the initial appointment with this provider, the following treatment goal was established: increase coping skills. Camiah has demonstrated progress in her goal as evidenced by increased awareness of hunger patterns and increased awareness of triggers for emotional eating behaviors. Vickye also continues to demonstrate willingness to engage in learned  skill(s).  Plan: The next appointment is scheduled for 06/07/2023 at 4pm, which will be via MyChart Video Visit. The next session will focus on working towards the established treatment goal. Amilliana will continue with her primary therapist.

## 2023-05-04 ENCOUNTER — Ambulatory Visit (INDEPENDENT_AMBULATORY_CARE_PROVIDER_SITE_OTHER): Payer: Medicare HMO | Admitting: Physician Assistant

## 2023-05-04 ENCOUNTER — Ambulatory Visit: Payer: Medicare HMO | Admitting: Licensed Clinical Social Worker

## 2023-05-04 ENCOUNTER — Encounter (INDEPENDENT_AMBULATORY_CARE_PROVIDER_SITE_OTHER): Payer: Self-pay | Admitting: Physician Assistant

## 2023-05-04 VITALS — BP 124/78 | HR 74 | Temp 97.9°F | Ht 62.0 in | Wt 209.0 lb

## 2023-05-04 DIAGNOSIS — Z6838 Body mass index (BMI) 38.0-38.9, adult: Secondary | ICD-10-CM

## 2023-05-04 DIAGNOSIS — E782 Mixed hyperlipidemia: Secondary | ICD-10-CM

## 2023-05-04 DIAGNOSIS — E785 Hyperlipidemia, unspecified: Secondary | ICD-10-CM | POA: Diagnosis not present

## 2023-05-04 DIAGNOSIS — R7303 Prediabetes: Secondary | ICD-10-CM | POA: Diagnosis not present

## 2023-05-04 DIAGNOSIS — Z789 Other specified health status: Secondary | ICD-10-CM | POA: Diagnosis not present

## 2023-05-04 DIAGNOSIS — Z6837 Body mass index (BMI) 37.0-37.9, adult: Secondary | ICD-10-CM

## 2023-05-04 DIAGNOSIS — E669 Obesity, unspecified: Secondary | ICD-10-CM | POA: Diagnosis not present

## 2023-05-04 DIAGNOSIS — F5089 Other specified eating disorder: Secondary | ICD-10-CM

## 2023-05-04 NOTE — Progress Notes (Unsigned)
SUBJECTIVE:  Chief Complaint: Obesity  Interim History: Brittney Ruiz is up 4 lbs since last visit.  Last in office visit 02/04/23.  Video visit 10/14/22.  Has been working with Dr. Dewaine Conger for emotional eating .   Brittney Ruiz, a 57 year old patient with a history of prediabetes, hyperlipidemia, anxiety, depression, low back pain with degenerative joint disease, and a previous history of surgery for a meningioma, presents for a follow-up of her obesity treatment plan. The patient's indirect calorimetry today was 1714, consistent with her previous measurement in June 2020. The patient reports frustration with her current weight management plan, as she has been gaining weight despite efforts to increase physical activity and maintain a healthy diet.  The patient has been using a pedal bike for exercise but has stopped physical therapy due to a referral to an orthopedic surgeon. She reports feeling stressed due to personal issues and has been experiencing increased cravings, which she attributes to feelings of hunger from her current diet plan. She has been trying to make healthier food choices when eating out, opting for salads, grilled chicken, and light dressings. However, she has not been consistently tracking her meals and admits to skipping meals when she does not have a plan.  The patient has been on various calorie and protein plans over the past three years, with initial weight loss followed by a plateau. She expresses frustration and emotional distress over her lack of progress, feeling that her weight is contributing to her other health issues. She has been discussing these concerns with her mental health provider, who has suggested meal planning and recording meals on MyFitnessPal.  The patient also reports side effects from a cholesterol injection, including aches and pains and elevated liver function, which she believes may be contributing to her back issues. She has been advised not to take the  next injection due in March. The patient is considering the possibility of starting Wegovy, a weight loss medication, but is concerned about potential side effects and wants to research it further.    Brittney Ruiz is here to discuss her progress with her obesity treatment plan. She is on the keeping a food journal and adhering to recommended goals of 1600 calories and 90 protein and states she is following her eating plan approximately 50 % of the time. She states she is exercising with foot peddle bike 30 minutes 4 times per week.  Fasting IC- done today 1714 Previous IC 10/29/21 was 1699     Medical hx:  Low back pain/DDD- due injection soon                         Plantar fasciitis            `                           Meningioma- with visual changes                                  Lung nodule?- Due follow up Chest CT soon                                     Multiple medication intolerance/allergies.  Anxiety/Depression  OBJECTIVE: Visit Diagnoses: Problem List Items Addressed This Visit     Generalized obesity   HLD (hyperlipidemia)   Prediabetes - Primary   Statin intolerance   Other Visit Diagnoses       BMI 38.0-38.9,adult, current BMI 38.3         Obesity 57 year old with obesity, currently 85 pounds over target weight. Indirect calorimetry shows a slight increase in metabolic rate from 1699 kcal/day in June 2023 to 1714 kcal/day. Reports frustration with weight gain despite perceived healthy eating and physical activity. Discussed 1200 kcal/day diet with 85 grams of protein, potential use of Wegovy for weight loss, including benefits (weight loss, potential improvement in liver enzymes and arthritis symptoms) and side effects (nausea, diarrhea, constipation). Consideration of Wegovy based on insurance approval and extensive allergies. - Recommend 1200 kcal/day diet with 85 grams of protein - Provide meal planning and recipe resources - [employer]  request for [employer] - Follow up in 4 weeks  Prediabetes Prediabetes with current dietary and weight management strategies aimed at preventing progression to diabetes. - Continue monitoring blood glucose levels - Adhere to dietary recommendations for weight loss  Hyperlipidemia The 10-year ASCVD risk score (Arnett DK, et al., 2019) is: 3%   Values used to calculate the score:     Age: 7 years     Sex: Female     Is Non-Hispanic African American: No     Diabetic: No     Tobacco smoker: No     Systolic Blood Pressure: 124 mmHg     Is BP treated: Yes     HDL Cholesterol: 40 mg/dL     Total Cholesterol: 160 mg/dL  Hyperlipidemia with history of elevated triglycerides and liver function abnormalities potentially related to previous cholesterol-lowering injections. Current lipid management under review due to adverse effects from previous medications. Discussed potential benefits of Wegovy for lipid management if approved by insurance. - Discontinue cholesterol-lowering injections - Monitor lipid panel and liver function tests - Consider Wegovy for lipid management if approved by insurance  Degenerative Joint Disease with Low Back Pain Significant low back pain due to degenerative joint disease. Previous physical therapy discontinued. Current management includes conservative measures and potential imaging for further evaluation. Discussed potential benefits of weight loss on joint pain. - Consider resuming physical therapy - Continue using pedal bike for exercise - Follow up with orthopedic surgeon for CT/MRI of the neck and back  Anxiety and Depression Anxiety and depression managed with mental health support from Dr. Dewaine Conger and another psychologist. Emotional eating is a concern. Receives financial assistance for mental health copays through [employer]. - Continue mental health support with Dr. Dewaine Conger and psychologist - Utilize MyFitnessPal to track meals (minimum 3 out of 7  meals) - Address emotional eating through ongoing therapy  General Health Maintenance Advised to maintain a healthy lifestyle to support overall well-being and manage chronic conditions. Discussed low-carb diet and provided additional recipes and meal planning resources. - Encourage low-carb diet - Provide additional recipes and meal planning resources  Follow-up - Follow up in 4 weeks.  Vitals Temp: 97.9 F (36.6 C) BP: 124/78 Pulse Rate: 74 SpO2: 100 %   Anthropometric Measurements Height: 5\' 2"  (1.575 m) Weight: 209 lb (94.8 kg) BMI (Calculated): 38.22 Weight at Last Visit: 205 lb Weight Lost Since Last Visit: 0 Weight Gained Since Last Visit: 4 lb Starting Weight: 217 lb Total Weight Loss (lbs): 8 lb (3.629 kg) Peak Weight: 234 lb   Body Composition  Body Fat %: 48.7 % Fat Mass (lbs): 102 lbs Muscle Mass (lbs): 102 lbs Total Body Water (lbs): 77.6 lbs Visceral Fat Rating : 14   Other Clinical Data RMR: 1714 Fasting: yes Labs: yes Today's Visit #: 40 Starting Date: 06/01/19     ASSESSMENT AND PLAN:  Diet: Brittney Ruiz is currently in the action stage of change. As such, her goal is to continue with weight loss efforts. She has agreed to keeping a food journal and adhering to recommended goals of 1200 calories and 85 grams of protein and following a lower carbohydrate, vegetable and lean protein rich diet plan. She has been on 1200 calories previously but felt excessively hungry on this caloric restriction and does not feel this will be feasible. We also discussed trying a low carbohydrate plan to see if we could kick start weight loss again. She will look over the plans and see what she feels will be reasonable at this point.   Exercise: Brittney Ruiz has been instructed that some exercise is better than none for weight loss and overall health benefits.   Behavior Modification:  We discussed the following Behavioral Modification Strategies today: increasing lean protein  intake, decreasing simple carbohydrates, increasing vegetables, increase H2O intake, increase high fiber foods, decreasing eating out, no skipping meals, meal planning and cooking strategies, emotional eating strategies , holiday eating strategies, planning for success, and keep a strict food journal. We discussed various medication options to help Brittney Ruiz with her weight loss efforts and we both agreed to continue to work on nutritional and behavioral strategies to promote weight loss.  We also discussed considering Wegovy for primary indication of hyperlipidemia as she remains at high CV risk and did not tolerate Leqvio due to worsening liver function, worsening triglycerides. She is statin intolerant and zetia intolerant due to body aches/muscle aches.  Return in about 4 weeks (around 06/01/2023).Marland Kitchen She was informed of the importance of frequent follow up visits to maximize her success with intensive lifestyle modifications for her multiple health conditions.  Attestation Statements:   Reviewed by clinician on day of visit: allergies, medications, problem list, medical history, surgical history, family history, social history, and previous encounter notes.   Time spent on visit including pre-visit chart review and post-visit care and charting was *** minutes.    Effie Wahlert, PA-C

## 2023-05-10 ENCOUNTER — Ambulatory Visit
Admission: RE | Admit: 2023-05-10 | Discharge: 2023-05-10 | Disposition: A | Discharge status: Home / Self Care | Payer: Medicare HMO | Source: Ambulatory Visit | Attending: Family | Admitting: Family

## 2023-05-10 DIAGNOSIS — Z1231 Encounter for screening mammogram for malignant neoplasm of breast: Secondary | ICD-10-CM | POA: Diagnosis not present

## 2023-05-11 ENCOUNTER — Ambulatory Visit (INDEPENDENT_AMBULATORY_CARE_PROVIDER_SITE_OTHER): Payer: Medicare HMO | Admitting: Physician Assistant

## 2023-05-13 DIAGNOSIS — M4802 Spinal stenosis, cervical region: Secondary | ICD-10-CM | POA: Diagnosis not present

## 2023-05-20 ENCOUNTER — Ambulatory Visit (INDEPENDENT_AMBULATORY_CARE_PROVIDER_SITE_OTHER): Payer: Medicare HMO | Admitting: Licensed Clinical Social Worker

## 2023-05-20 DIAGNOSIS — F331 Major depressive disorder, recurrent, moderate: Secondary | ICD-10-CM | POA: Diagnosis not present

## 2023-05-20 DIAGNOSIS — F411 Generalized anxiety disorder: Secondary | ICD-10-CM | POA: Diagnosis not present

## 2023-05-20 NOTE — Progress Notes (Signed)
 [facility] Counselor/Therapist Progress Note  Patient ID: Brittney Ruiz, MRN: [id number]    Date: 05/20/23  Time Spent: 0205  pm - 0300 pm : 55 Minutes  Treatment Type: Individual Therapy.  Reported Symptoms: Anxiety and recent depression due to the holidays and the circumstances surrounding the holidays  Mental Status Exam: Appearance:  Casual     Behavior: Appropriate  Motor: Normal  Speech/Language:  Clear and Coherent  Affect: Appropriate  Mood: depressed  Thought process: normal  Thought content:   WNL  Sensory/Perceptual disturbances:   WNL  Orientation: oriented to person, place, time/date, situation, day of week, month of year, and year  Attention: Good  Concentration: Good  Memory: WNL  Fund of knowledge:  Good  Insight:   Good  Judgment:  Good  Impulse Control: Good   Risk Assessment: Danger to Self:  No Self-injurious Behavior: No Danger to Others: No Duty to Warn:no Physical Aggression / Violence:No  Access to Firearms a concern: No  Gang Involvement:No   Subjective:   Brittney Ruiz participated from home, via video and phone due to connectivity issues. Brittney Ruiz consented to treatment being aware of the risk and limitations. Therapist participated from home office. We met online due to patient request.  Brittney Ruiz presented for her session reporting that she had a very difficult holiday season. Patient reports that not having her children and grandchildren to celebrate with has really impacted her. Patient also reported that she has been helping her mother with estate planning and so forth. She reports that this too is very difficult. Patient reports she is more aware of her Mothers declining health due to her age.   Patient and Clinician processed her feelings of how to handle these situations with her children. Clinician encouraged patient to set boundaries and the importance of standing firm on those boundaries. Patient identified that she never let her  children take the fall for their behaviors and now they think they can disrespect her and anyone else that they don't like what they say. Clinician and patient discussed the purpose of boundaries and how boundaries are part of the foundation of any relationship. We discussed that setting them in the beginning can improve the quality of the relationship as people will be aware early on what is acceptable and what isn't. Patient was insightful in recognizing she cannot control her childrens choices and that she is only responsible for herself. She reports that she will stay busy helping her mother and staying strong in her faith.  Clinician provided homework of reading the book, Good Boundaries and Goodbyes by Olam Canny. Clinician also encouraged patient to continue to remain active in her community and her church. Clinician encouraged patient to practice self-care and continue utilizing breathing techniques to calm herself when she is feeling anxious. Patient will continue with biweekly therapy sessions and treatment plan will be reviewed by 01/27/2024.     Interventions: Cognitive Behavioral Therapy  Diagnosis: Generalized anxiety disorder, Major Depressive Disorder, moderate recurrent.   Damien Junk MSW, LCSW/DATE 05/20/2023

## 2023-05-24 ENCOUNTER — Telehealth (INDEPENDENT_AMBULATORY_CARE_PROVIDER_SITE_OTHER): Payer: Self-pay | Admitting: Psychology

## 2023-05-24 NOTE — Telephone Encounter (Signed)
 Patient called in to say her visit on 10/8 and 10/14 with you and 12/17 with Shawn was billed incorrectly. [facility] rejected these visit due to incomplete forms and no EOB from the office for the visits. Patient states you are aware of the process. Please give patient a call at [phone] after 10 am.

## 2023-05-25 ENCOUNTER — Telehealth (INDEPENDENT_AMBULATORY_CARE_PROVIDER_SITE_OTHER): Payer: Self-pay | Admitting: Psychology

## 2023-05-25 NOTE — Telephone Encounter (Signed)
  Office: [phone]  /  Fax: [phone]  Date of Call: May 25, 2023  Time of Call: 8:38am Provider: Wyatt Fire, PsyD  CONTENT:  This provider called [place] to follow-up regarding a MyChart message she sent yesterday. A HIPAA compliant voicemail was left requesting a call back.   PLAN: This provider will wait for Blaklee to call back. No further follow-up planned by this provider.

## 2023-06-01 ENCOUNTER — Ambulatory Visit (INDEPENDENT_AMBULATORY_CARE_PROVIDER_SITE_OTHER): Payer: Medicare HMO | Admitting: Family Medicine

## 2023-06-01 ENCOUNTER — Encounter (INDEPENDENT_AMBULATORY_CARE_PROVIDER_SITE_OTHER): Payer: Self-pay | Admitting: Family Medicine

## 2023-06-01 ENCOUNTER — Telehealth (INDEPENDENT_AMBULATORY_CARE_PROVIDER_SITE_OTHER): Payer: Self-pay | Admitting: Psychology

## 2023-06-01 ENCOUNTER — Ambulatory Visit (INDEPENDENT_AMBULATORY_CARE_PROVIDER_SITE_OTHER): Payer: Medicare HMO | Admitting: Physician Assistant

## 2023-06-01 VITALS — BP 121/78 | HR 72 | Temp 98.4°F | Ht 62.0 in | Wt 207.0 lb

## 2023-06-01 DIAGNOSIS — R7303 Prediabetes: Secondary | ICD-10-CM

## 2023-06-01 DIAGNOSIS — Z6837 Body mass index (BMI) 37.0-37.9, adult: Secondary | ICD-10-CM

## 2023-06-01 DIAGNOSIS — E669 Obesity, unspecified: Secondary | ICD-10-CM | POA: Diagnosis not present

## 2023-06-01 DIAGNOSIS — Z6838 Body mass index (BMI) 38.0-38.9, adult: Secondary | ICD-10-CM

## 2023-06-01 MED ORDER — METFORMIN HCL 500 MG PO TABS: ORAL_TABLET | ORAL | 0 refills | Status: DC

## 2023-06-01 NOTE — Progress Notes (Deleted)
   SUBJECTIVE:  Chief Complaint: Obesity  Interim History: ***  Brittney Ruiz is here to discuss her progress with her obesity treatment plan. She is on the {HWW Weight Loss Plan:210964005} and states she {CHL AMB IS/IS NOT:210130109} following her eating plan approximately *** % of the time. She states she {CHL AMB IS/IS NOT:210130109} exercising *** minutes *** times per week.   OBJECTIVE: Visit Diagnoses: Problem List Items Addressed This Visit     Generalized obesity   HLD (hyperlipidemia)   Prediabetes - Primary   Statin intolerance    No data recorded No data recorded No data recorded No data recorded   ASSESSMENT AND PLAN:  Diet: Maat {CHL AMB IS/IS NOT:210130109} currently in the action stage of change. As such, her goal is to {HWW Weight Loss Efforts:210964006}. She {HAS HAS WNU:81165} agreed to {HWW Weight Loss Plan:210964005}.  Exercise: Shalin has been instructed {HWW Exercise:210964007} for weight loss and overall health benefits.   Behavior Modification:  We discussed the following Behavioral Modification Strategies today: {HWW Behavior Modification:210964008}. We discussed various medication options to help Heaven with her weight loss efforts and we both agreed to ***.  No follow-ups on file.SABRA She was informed of the importance of frequent follow up visits to maximize her success with intensive lifestyle modifications for her multiple health conditions.  Attestation Statements:   Reviewed by clinician on day of visit: allergies, medications, problem list, medical history, surgical history, family history, social history, and previous encounter notes.   Time spent on visit including pre-visit chart review and post-visit care and charting was *** minutes.    Ples Trudel, PA-C

## 2023-06-01 NOTE — Progress Notes (Signed)
 Brittney DOROTHA Ruiz, D.O.  ABFM, ABOM Specializing in Clinical Bariatric Medicine  Office located at: [address] [place], [place]  [address]   Assessment and Plan:   FOR THE DISEASE OF OBESITY: BMI 38.0-38.9,adult, current BMI 37.85 Generalized obesity, starting BMI 39.69 Assessment & Plan: Since last office visit on 05/04/23 patient's muscle mass has increased by 1 lb. Fat mass has decreased by 3.2 lb. Total body water has decreased by 2 lb.  Counseling done on how various foods will affect these numbers and how to maximize success  Total lbs lost to date: 10 lbs  Total weight loss percentage to date: 4.61%    Recommended Dietary Goals Brittney Ruiz is currently in the action stage of change. As such, her goal is to continue weight management plan.  She has agreed to: follow the Category 2 MP with B/L options and only 100 snack calories. We made this switch because it has been difficult for pt to journal.    Behavioral Intervention We discussed the following today: continue to work on maintaining a reduced calorie state, getting the recommended amount of protein, incorporating whole foods, making healthy choices, staying well hydrated and practicing mindfulness when eating.  Additional resources provided today: handout on CAT 2 meal plan, Handout on CAT 1-2 breakfast options, and Handout on CAT 1-2 lunch options  Evidence-based interventions for health behavior change were utilized today including the discussion of self monitoring techniques, problem-solving barriers and SMART goal setting techniques.   Regarding patient's less desirable eating habits and patterns, we employed the technique of small changes.   Pt will specifically work on: n/a   Recommended Physical Activity Goals Brittney Ruiz has been advised to work up to 150 minutes of moderate intensity aerobic activity a week and strengthening exercises 2-3 times per week for cardiovascular health, weight loss maintenance and  preservation of muscle mass.   She has agreed to : Think about enjoyable ways to increase daily physical activity and overcoming barriers to exercise   Pharmacotherapy We both agreed to :  continue with nutritional and behavioral strategies and start Metformin    FOR ASSOCIATED CONDITIONS ADDRESSED TODAY:  Prediabetes Assessment & Plan: Lab Results  Component Value Date   HGBA1C 5.6 02/04/2023   HGBA1C 5.7 (H) 07/30/2022   HGBA1C 5.5 02/02/2022   INSULIN  12.3 02/04/2023   INSULIN  7.9 07/30/2022   INSULIN  16.0 02/02/2022    Most recent Hemoglobin A1c and fasting insulin  above. I feel that pt would benefit from Metformin  to help with her insulin  resistance and weight loss efforts. I went over the anticipated benefits and possible risks of Metformin - pt is agreeable to starting Metformin  today. Continue prudent nutritional plan.   Order: -     metFORMIN  HCl; 1/2 po with lunch daily  Dispense: 30 tablet; Refill: 0  Follow up:   Return 06/29/2023. She was informed of the importance of frequent follow up visits to maximize her success with intensive lifestyle modifications for her multiple health conditions.  Subjective:   Chief complaint: Obesity Brittney Ruiz is here to discuss her progress with her obesity treatment plan. She is  keeping a food journal and adhering to recommended goals of 1600 calories and 90+ protein and states she is following her eating plan approximately 90% of the time. She states she is doing floor pedal biking 30 minutes, 5 days a week.   Interval History:  Brittney Ruiz is here for a follow up office visit. Since last OV on 05/04/23, Brittney Ruiz is  down 2 lbs. Pt expresses frustration regarding the results of her weight loss journey. She states that when she first started the program and worked with Dwayne Sheen PA, she lost about 30 lbs. Once Tracey left the practice, pt states that her weight has been fluctuating - sometimes she is down and other times up. In terms of  journaling, pt states she does it about 90% of the time. However upon review of her My Fitness Pal, there is no data for the last 3 weeks.   Pharmacotherapy for weight loss: She is currently taking no anti-obesity medication.   Review of Systems:  Pertinent positives were addressed with patient today.  Reviewed by clinician on day of visit: allergies, medications, problem list, medical history, surgical history, family history, social history, and previous encounter notes.  Weight Summary and Biometrics   Weight Lost Since Last Visit: 2lb  Weight Gained Since Last Visit: 0lb   Vitals Temp: 98.4 F (36.9 C) BP: 121/78 Pulse Rate: 72 SpO2: 96 %   Anthropometric Measurements Height: 5' 2 (1.575 m) Weight: 207 lb (93.9 kg) BMI (Calculated): 37.85 Weight at Last Visit: 209lb Weight Lost Since Last Visit: 2lb Weight Gained Since Last Visit: 0lb Starting Weight: 217lb Total Weight Loss (lbs): 10 lb (4.536 kg) Peak Weight: 234lb   Body Composition  Body Fat %: 47.7 % Fat Mass (lbs): 98.8 lbs Muscle Mass (lbs): 103 lbs Total Body Water (lbs): 75.6 lbs Visceral Fat Rating : 14   Other Clinical Data RMR: 1714 Fasting: no Labs: no Today's Visit #: 41 Starting Date: 06/01/19   Objective:   PHYSICAL EXAM: Blood pressure 121/78, pulse 72, temperature 98.4 F (36.9 C), height 5' 2 (1.575 m), weight 207 lb (93.9 kg), SpO2 96%. Body mass index is 37.86 kg/m.  General: she is overweight, cooperative and in no acute distress. PSYCH: Has normal mood, affect and thought process.   HEENT: EOMI, sclerae are anicteric. Lungs: Normal breathing effort, no conversational dyspnea. Extremities: Moves * 4 Neurologic: A and O * 3, good insight  DIAGNOSTIC DATA REVIEWED: BMET    Component Value Date/Time   NA 138 02/04/2023 1555   NA 136 10/06/2012 0434   K 4.3 02/04/2023 1555   K 3.6 10/06/2012 0434   CL 100 02/04/2023 1555   CL 104 10/06/2012 0434   CO2 23 02/04/2023  1555   CO2 26 10/06/2012 0434   GLUCOSE 88 02/04/2023 1555   GLUCOSE 92 03/03/2021 1412   GLUCOSE 114 (H) 10/06/2012 0434   BUN 17 02/04/2023 1555   BUN 7 10/06/2012 0434   CREATININE 0.76 02/04/2023 1555   CREATININE 0.73 08/09/2015 1609   CALCIUM  9.0 02/04/2023 1555   CALCIUM  9.3 10/06/2012 0434   GFRNONAA >60 10/07/2020 1506   GFRNONAA >60 10/06/2012 0434   GFRAA 107 05/07/2020 1302   GFRAA >60 10/06/2012 0434   Lab Results  Component Value Date   HGBA1C 5.6 02/04/2023   HGBA1C 5.4 10/02/2014   Lab Results  Component Value Date   INSULIN  12.3 02/04/2023   INSULIN  26.1 (H) 06/01/2019   Lab Results  Component Value Date   TSH 1.830 02/04/2023   CBC    Component Value Date/Time   WBC 6.4 02/04/2023 1555   WBC 7.5 10/07/2020 1506   RBC 4.77 02/04/2023 1555   RBC 5.28 (H) 10/07/2020 1506   HGB 13.8 02/04/2023 1555   HCT 42.2 02/04/2023 1555   PLT 145 (L) 02/04/2023 1555   MCV 89 02/04/2023 1555  MCV 86 10/06/2012 0434   MCH 28.9 02/04/2023 1555   MCH 29.2 10/07/2020 1506   MCHC 32.7 02/04/2023 1555   MCHC 33.8 10/07/2020 1506   RDW 14.0 02/04/2023 1555   RDW 13.8 10/06/2012 0434   Iron Studies    Component Value Date/Time   IRON 77 04/21/2021 1130   TIBC 283 04/21/2021 1130   FERRITIN 336 (H) 04/21/2021 1133   IRONPCTSAT 27 04/21/2021 1130   Lipid Panel     Component Value Date/Time   CHOL 160 02/04/2023 1555   TRIG 195 (H) 02/04/2023 1555   HDL 40 02/04/2023 1555   CHOLHDL 7.2 (H) 07/30/2022 1140   CHOLHDL 8 05/02/2018 1457   VLDL 72.0 (H) 05/02/2018 1457   LDLCALC 87 02/04/2023 1555   LDLDIRECT 141.0 05/02/2018 1457   Hepatic Function Panel     Component Value Date/Time   PROT 6.6 03/11/2023 0403   PROT 7.3 10/06/2012 0434   ALBUMIN 4.6 03/11/2023 0403   ALBUMIN 4.2 10/06/2012 0434   AST 61 (H) 03/11/2023 0403   AST 17 10/06/2012 0434   ALT 77 (H) 03/11/2023 0403   ALT 27 10/06/2012 0434   ALKPHOS 74 03/11/2023 0403   ALKPHOS 64  10/06/2012 0434   BILITOT 0.4 03/11/2023 0403   BILITOT 0.6 10/06/2012 0434   BILIDIR 0.12 03/11/2023 0403      Component Value Date/Time   TSH 1.830 02/04/2023 1555   Nutritional Lab Results  Component Value Date   VD25OH 46.4 02/04/2023   VD25OH 44.0 07/30/2022   VD25OH 48.7 06/09/2021    Attestations:   I, Special Puri, acting as a stage manager for Brittney Jenkins, DO., have compiled all relevant documentation for today's office visit on behalf of Brittney Jenkins, DO, while in the presence of [employer], DO.  I have reviewed the above documentation for accuracy and completeness, and I agree with the above. Brittney Ruiz, D.O.  The 21st Century Cures Act was signed into law in 2016 which includes the topic of electronic health records.  This provides immediate access to information in MyChart.  This includes consultation notes, operative notes, office notes, lab results and pathology reports.  If you have any questions about what you read please let us  know at your next visit so we can discuss your concerns and take corrective action if need be.  We are right here with you.

## 2023-06-01 NOTE — Telephone Encounter (Signed)
 Patient asked me to put in a note to ask you to call her [phone]

## 2023-06-02 ENCOUNTER — Telehealth (INDEPENDENT_AMBULATORY_CARE_PROVIDER_SITE_OTHER): Payer: Self-pay | Admitting: Psychology

## 2023-06-02 NOTE — Telephone Encounter (Addendum)
  Office: [phone]  /  Fax: [phone]  Date of Call: June 02, 2023  Time of Call: 11:34am Provider: Catherene Close, PsyD  CONTENT: This provider called Kassidey again as she called the office yesterday requesting a call from this provider. A HIPAA compliant voicemail was left requesting a call back and this provider also reminded Leia that she is not in the office for the remainder of the week, but she could try to call back and/or concerns could be discussed during the scheduled appointment on Monday.   PLAN: This provider will wait for Shatasia to call back. No further follow-up planned by this provider.  Montessa is scheduled for an appointment on 06/07/2023 at 4pm.

## 2023-06-02 NOTE — Telephone Encounter (Signed)
  Office: [phone]  /  Fax: [phone]  Date of Call: June 02, 2023  Time of Call: 11:39am Duration of Call: 18 minute(s) Provider: Catherene Close, PsyD  CONTENT: Brittney Ruiz returned this provider's call. She explained that appropriate paperwork was not completed by the [facility] office for her to receive coverage for co-pays resulting from visits with this provider. She further stated that she was provided the grant due to depression/anxiety related symptoms that started after she was diagnosed with cancer. This provider re-iterated her role with the clinic and explained that she would not be able to speak to cancer-related concerns when completing paperwork. Brittney Ruiz acknowledged understanding. This provider also explained an authorization will need to be completed and returned prior to this provider completing and faxing the required documentation for reimbursement. Brittney Ruiz acknowledged understanding. A brief risk assessment was completed. Brittney Ruiz denied experiencing suicidal, self-harm and homicidal ideation, plan, or intent since the last appointment with this provider. All questions/concerns addressed.   PLAN: This provider sent an authorization form via e-mail for Brittney Ruiz to complete and return. She also agreed to send the form needed for reimbursement via e-mail for this provider to complete. Brittney Ruiz is scheduled for an appointment on 06/07/2023 at 4pm.

## 2023-06-03 ENCOUNTER — Ambulatory Visit: Payer: Medicare HMO | Admitting: Licensed Clinical Social Worker

## 2023-06-03 DIAGNOSIS — F331 Major depressive disorder, recurrent, moderate: Secondary | ICD-10-CM | POA: Diagnosis not present

## 2023-06-03 DIAGNOSIS — F411 Generalized anxiety disorder: Secondary | ICD-10-CM | POA: Diagnosis not present

## 2023-06-03 NOTE — Progress Notes (Signed)
[facility] Counselor/Therapist Progress Note  Patient ID: Brittney Ruiz, MRN: [id number]    Date: 06/03/23  Time Spent: 0200  pm - 0300 pm : 60 Minutes  Treatment Type: Individual Therapy.  Reported Symptoms: Anxiety, stress and depression  Mental Status Exam: Appearance:  Casual     Behavior: Agitated  Motor: Normal  Speech/Language:  Clear and Coherent  Affect: Appropriate  Mood: irritable  Thought process: normal  Thought content:   WNL  Sensory/Perceptual disturbances:   WNL  Orientation: oriented to person, place, time/date, situation, day of week, month of year, and year  Attention: Good  Concentration: Good  Memory: WNL  Fund of knowledge:  Fair  Insight:   Fair  Judgment:  Fair  Impulse Control: Good   Risk Assessment: Danger to Self:  No Self-injurious Behavior: No Danger to Others: No Duty to Warn:no Physical Aggression / Violence:No  Access to Firearms a concern: No  Gang Involvement:No   Subjective:   Brittney Ruiz participated from home, via video, and was aware of risks and limitations, and consented to treatment. Therapist participated from office. We met online due to patient request.  Brittney Ruiz presented for her session reporting she has been agitated about her weight loss program and the providers working with her. She reports that she doesn't want to take medication and doesn't feel that the calories they are allowing her is reasonable. Patient spoke candidly about her weight loss journey and how she feels it has not been successful with the program. Patient also expressed her agitation at the billing process and how much of her treatment is not being billed under the proper insurance provider which is a special program she received a grant for. Patient exhibited frustration and e negative frame of mind evidenced by her  negative verbal interaction.  Clinician provided patient with encouragement through active listening and verbal interaction.  Clinician encouraged patient to discuss her concerns with her weight loss providers and seek alternative options from them, or perhaps they may be able to make a referral for alternative options. Clinician encouraged patient to do some of her own research so she can feel more comfortable about suggestions provided to her by her providers. Clinician and patient also processed ideas for increased physical activity that also may assist in her weight loss journey.  Patient will focus on reducing symptoms, improving mood, and establishing a healthy routine. Patient will also set goals to challenge negative thoughts and improve sleep.   Take medication: Medication can help reduce symptoms of depression.  Attend therapy: Therapy can help you identify triggers, correct irrational thinking, and learn healthy coping strategies.  Make lifestyle changes: Lifestyle changes can include exercise, sleep, and nutrition.  Improve mood Spend time with loved ones: Spending time with friends and family can help improve your mood.  Exercise: Exercise can help you feel better about yourself and give you a chance to socialize.  Practice mindfulness: Mindfulness can help you manage your mood.  Challenge negative thoughts  Identify negative thoughts Notice when you have negative thoughts and examine the evidence that supports them. Question whether the negative thoughts are accurate and try to create a more balanced perspective. Establish a healthy routine Set a sleep schedule: A consistent sleep routine can improve your sleep quality and help you feel better.  Stick to a daily schedule: Try to be out of bed by a certain time and finish household tasks. Continue with biweekly therapy. Treatment plan will be reviewed by 9/112025.  Interventions: Cognitive Behavioral Therapy  Diagnosis: Generalized Anxiety Disorder and Major Depressive Disorder, recurrent moderate   .  Phyllis Ginger MSW, LCSW/DATE 06/03/2023

## 2023-06-07 ENCOUNTER — Telehealth (INDEPENDENT_AMBULATORY_CARE_PROVIDER_SITE_OTHER): Payer: Medicare HMO | Admitting: Psychology

## 2023-06-07 DIAGNOSIS — F5089 Other specified eating disorder: Secondary | ICD-10-CM

## 2023-06-07 DIAGNOSIS — F419 Anxiety disorder, unspecified: Secondary | ICD-10-CM

## 2023-06-07 DIAGNOSIS — F32A Depression, unspecified: Secondary | ICD-10-CM | POA: Diagnosis not present

## 2023-06-07 NOTE — Progress Notes (Signed)
Office: [phone]  /  Fax: [phone]    Date: June 07, 2023  Appointment Start Time: 4:01pm Duration: 44 minutes Provider: Lawerance Cruel, Psy.D. Type of Session: Individual Therapy  Location of Patient:  Friend's home  (address obtained; private location) Location of Provider: Provider's Home (private office) Type of Contact: Telepsychological Visit via MyChart Video Visit  Session Content: Brittney Ruiz is a 58 y.o. female presenting for a follow-up appointment to address the previously established treatment goal of increasing coping skills.Today's appointment was a telepsychological visit. Mega provided verbal consent for today's telepsychological appointment and she is aware she is responsible for securing confidentiality on her end of the session. Prior to proceeding with today's appointment, Sakshi's physical location at the time of this appointment was obtained as well a phone number she could be reached at in the event of technical difficulties. Maliea and this provider participated in today's telepsychological service.   Of note, today's appointment was switched to a regular telephone call at 4:06pm with [name]'s verbal consent due to technical issues.   This provider conducted a brief check-in. [name] and this provider discussed documentation for information to be released for reimbursement from her grant. She clarified the form she sent this provider is accurate and this provider can disregard the medication aspect and write a letter indicating she met with this provider to address emotional eating behaviors. This provider explained that she would be unable to provide an EOB as it is something her insurance company will have to provide for her grant; she acknowledged understanding and re-iterated this provider needs to complete the form she sent and write a letter stating she is being treated by this provider, DOS, and reason for treatment. Regarding eating habits, she stated it is going "fine."  She shared about her appointment with Dr. Sharee Holster, noting multiple changes. She feels the current changes are not realistic. As such, the SMART goal was not completed. Further explored and processed. Remainder of session focused on helping [name] eat regularly. She agreed to set reminders for every 2-3 to eat and develop a working plan for meals for the day after completing morning routine (habit stacking). Overall, [name] was receptive to today's appointment as evidenced by openness to sharing, responsiveness to feedback, and willingness to implement discussed strategies .  Mental Status Examination:  Appearance: neat Behavior: appropriate to circumstances Mood: frustrated Affect: mood congruent Speech: WNL Eye Contact: appropriate Psychomotor Activity: WNL Gait: unable to assess Thought Process: linear, logical, and goal directed and denies suicidal, homicidal, and self-harm ideation, plan and intent  Thought Content/Perception: no hallucinations, delusions, bizarre thinking or behavior endorsed or observed Orientation: AAOx4 Memory/Concentration: intact Insight: fair Judgment: fair  Interventions:  Conducted a brief chart review Conducted a risk assessment Provided empathic reflections and validation Reviewed content from the previous session Employed supportive psychotherapy interventions to facilitate reduced distress and to improve coping skills with identified stressors Engaged patient in problem solving  DSM-5 Diagnosis(es):  F50.89 Other Specified Feeding or Eating Disorder, Emotional Eating Behaviors, F41.9 Unspecified Anxiety Disorder, and  F32.A Unspecified Depressive Disorder  Treatment Goal & Progress: During the initial appointment with this provider, the following treatment goal was established: increase coping skills. [name] has demonstrated progress in her goal as evidenced by increased awareness of hunger patterns and increased awareness of triggers for emotional eating  behaviors. [name] also continues to demonstrate willingness to engage in learned skill(s).  Plan: The next appointment is scheduled for 06/29/2023 at 4pm, which will be via MyChart Video Visit.  The next session will focus on working towards the established treatment goal. [name] will print, complete, and return the authorization form sent by this provider for release of information. Once received, this provider will complete the paperwork [name] sent from her grant. Jaleisha will continue with her primary therapist.    Lawerance Cruel, PsyD

## 2023-06-08 ENCOUNTER — Telehealth (INDEPENDENT_AMBULATORY_CARE_PROVIDER_SITE_OTHER): Payer: Self-pay | Admitting: Psychology

## 2023-06-08 NOTE — Telephone Encounter (Signed)
  Office: [phone]  /  Fax: [phone]  Date of Call: June 08, 2023  Time of Call: 4:54pm Duration of Call: 10 minute(s) Provider: Lawerance Cruel, PsyD  CONTENT:  This provider called Brittney Ruiz to discuss the form that she sent for this provider to complete for her grant. This provider re-iterated that the form is for prescriptions and not for mental health services. Brittney Ruiz was also informed that this provider consulted with the practice administrator Brittney Ruiz) and Interior and spatial designer of physician practices [facility]). Per Mr. Brittney Ruiz, the form shared by Brittney Ruiz was for prescriptions. This provider also re-iterated that the focus of treatment is addressing emotional eating behaviors secondary to anxiety, depression, stressors, etc. and developing coping strategies, as Brittney Ruiz again indicated that her grant is for cancer-related behavioral health concerns. Brittney Ruiz expressed concern regarding Brittney Ruiz covering the co-pays. This provider explained that she would not be able to remove the primary diagnosis of Other Specified Feeding or Eating Disorder, Emotional Eating Behaviors as that is the reason for referral and treatment. She indicated she would follow-up with [facility], noting she may have to cancel appointments with this provider if they do not provide coverage for co-pays. No evidence or endorsement of any safety concerns. All questions/concerns addressed.   PLAN: Brittney Ruiz will follow-up [facility] to obtain the correct form to send to this provider if applicable. Brittney Ruiz is scheduled for an appointment on 06/29/2023 at 4pm.

## 2023-06-15 ENCOUNTER — Telehealth: Payer: Self-pay | Admitting: Internal Medicine

## 2023-06-15 NOTE — Telephone Encounter (Signed)
Spoke to pt. Pt verified name and DOB. Patient stated she called the infusion clinic and they told her to contact Dr. Blanchie Dessert office about her outstanding balance. Pt stated her insurance should not have been billed since she was approved for her medication through [employer]. I told patient that Dr. Rennis Golden prescribes the medication and the infusion clinic has its own prior authorization team. Patient would like some advise about how she can remove this balance as she cannot pay for it. Please advise.   Josie LPN

## 2023-06-15 NOTE — Telephone Encounter (Signed)
Pt c/o medication issue:  1. Name of Medication:  Leqvio  2. How are you currently taking this medication (dosage and times per day)?   3. Are you having a reaction (difficulty breathing--STAT)?   4. What is your medication issue?   Patient states she received a bill for an outstanding balance of $710.17. Outstanding since 01/19/23. Patient states the medication should've been completely approved through [employer], so she would like to have this resolved. She has concerns with it going to collections soon. Patient mentions that she had the same issue back in May. Patient is hopeful for a call back this afternoon, but if her call is returned tomorrow she requests after 11:00 AM.

## 2023-06-16 NOTE — Telephone Encounter (Signed)
Hello, I have sent an email to the claims dept for them to correct and f/u.  She should not be charged.  Once I have a response from the claims dept. I will f/u with the patient.  Thanks [name]

## 2023-06-16 NOTE — Telephone Encounter (Signed)
Brittney Ruiz, I have spoken with the claims dept and it will be corrected, patient will not be charged.  I have also spoken with the the patient and explained she will not be charged or reported to collections.  Patient understood and had no further questions. [name]

## 2023-06-17 ENCOUNTER — Ambulatory Visit: Payer: Self-pay | Admitting: Family

## 2023-06-17 ENCOUNTER — Emergency Department: Payer: Medicare HMO

## 2023-06-17 ENCOUNTER — Ambulatory Visit: Payer: Medicare HMO | Admitting: Licensed Clinical Social Worker

## 2023-06-17 ENCOUNTER — Other Ambulatory Visit: Payer: Self-pay

## 2023-06-17 ENCOUNTER — Encounter: Payer: Self-pay | Admitting: Intensive Care

## 2023-06-17 DIAGNOSIS — R079 Chest pain, unspecified: Secondary | ICD-10-CM | POA: Insufficient documentation

## 2023-06-17 DIAGNOSIS — R531 Weakness: Secondary | ICD-10-CM | POA: Diagnosis not present

## 2023-06-17 DIAGNOSIS — R5383 Other fatigue: Secondary | ICD-10-CM | POA: Diagnosis not present

## 2023-06-17 DIAGNOSIS — R9431 Abnormal electrocardiogram [ECG] [EKG]: Secondary | ICD-10-CM | POA: Diagnosis not present

## 2023-06-17 DIAGNOSIS — R42 Dizziness and giddiness: Secondary | ICD-10-CM | POA: Diagnosis not present

## 2023-06-17 DIAGNOSIS — R5381 Other malaise: Secondary | ICD-10-CM | POA: Diagnosis not present

## 2023-06-17 LAB — RESP PANEL BY RT-PCR (RSV, FLU A&B, COVID)  RVPGX2
Influenza A by PCR: NEGATIVE
Influenza B by PCR: NEGATIVE
Resp Syncytial Virus by PCR: NEGATIVE
SARS Coronavirus 2 by RT PCR: NEGATIVE

## 2023-06-17 LAB — BASIC METABOLIC PANEL
Anion gap: 12 (ref 5–15)
BUN: 20 mg/dL (ref 6–20)
CO2: 25 mmol/L (ref 22–32)
Calcium: 9.4 mg/dL (ref 8.9–10.3)
Chloride: 102 mmol/L (ref 98–111)
Creatinine, Ser: 0.63 mg/dL (ref 0.44–1.00)
GFR, Estimated: >60 mL/min (ref >60)
Glucose, Bld: 99 mg/dL (ref 70–99)
Potassium: 4.3 mmol/L (ref 3.5–5.1)
Sodium: 139 mmol/L (ref 135–145)

## 2023-06-17 LAB — CBC
HCT: 45.6 % (ref 36.0–46.0)
Hemoglobin: 15.5 g/dL — ABNORMAL HIGH (ref 12.0–15.0)
MCH: 29.3 pg (ref 26.0–34.0)
MCHC: 34 g/dL (ref 30.0–36.0)
MCV: 86.2 fL (ref 80.0–100.0)
Platelets: 155 10*3/uL (ref 150–400)
RBC: 5.29 MIL/uL — ABNORMAL HIGH (ref 3.87–5.11)
RDW: 13.3 % (ref 11.5–15.5)
WBC: 7.6 10*3/uL (ref 4.0–10.5)
nRBC: 0 % (ref 0.0–0.2)

## 2023-06-17 LAB — TROPONIN I (HIGH SENSITIVITY): Troponin I (High Sensitivity): 2 ng/L (ref <18)

## 2023-06-17 NOTE — Telephone Encounter (Signed)
Chief Complaint: dizziness Symptoms: dizziness, intermittent CP, sinus congestion, headache Frequency: symptoms onset 4 days ago Pertinent Negatives: Patient denies LOC, falling, SOB Disposition: [x] ED /[] Urgent Care (no appt availability in office) / [] Appointment(In office/virtual)/ []  [place] [facility]/ [] Home Care/ [] Refused Recommended Disposition /[] [place] [facility]/ []  Follow-up with PCP Additional Notes: Pt reports 4 days of dizziness. Pt states "I'm just dizzy", states she has had to hold on to things. Denies falling or LOC. Pt also reports two 15-minute episodes of chest pain. None today. Latest was yesterday, and then the day before. 5/10 CP during episodes. States the pain radiated across her chest and to her left arm. States she also wears a brace on her left arm because her arm starts to hurt while she knits. No SOB. Pt also endorses post nasal drip, sinus congestion, and headache/sinus pain. Endorses fatigue and episode of diaphoresis but states this happens to her sometimes d/t menopause. Per protocol, pt advised to go to the ED. Pt stated "Why? I just want an appt with Claris Che?" RN stated that due to symptoms of chest pain and 4 days of dizziness, combined with pt's cardiac risk factors of HTN and HLD, she needed to be seen in the ED. Pt stated she is worried she will get sick from others if she goes to the hospital. RN provided education about why the ED is necessary, pt then was agreeable to going. RN asked that pt not drive herself. Pt reported her husband may be able to take her, but he may not do it because he thinks she just has a sinus infection. RN asked that if husband refuses to drive pt to the ED, that pt needs to call 911 for transportation. RN advised that pt should not drive while dizzy. Pt verbalized understanding.   Copied from CRM [id number]. Topic: Clinical - Red Word Triage >> Jun 17, 2023  2:31 PM Elizebeth Brooking wrote: [facility] that prompted transfer to Nurse  Triage: Patient is experiencing  dizziness, stated she feels like she has pressure in her head. Has alittle post nasal drip having been coughing or running a fever of anykind . Scared she doesn't want to fall and break her hip Reason for Disposition  Dizziness or lightheadedness  Answer Assessment - Initial Assessment Questions 1. DESCRIPTION: "Describe your dizziness."     "Just dizzy" 2. LIGHTHEADED: "Do you feel lightheaded?" (e.g., somewhat faint, woozy, weak upon standing)     Np 3. VERTIGO: "Do you feel like either you or the room is spinning or tilting?" (i.e. vertigo)     No 4. SEVERITY: "How bad is it?"  "Do you feel like you are going to faint?" "Can you stand and walk?"   - MILD: Feels slightly dizzy, but walking normally.   - MODERATE: Feels unsteady when walking, but not falling; interferes with normal activities (e.g., school, work).   - SEVERE: Unable to walk without falling, or requires assistance to walk without falling; feels like passing out now.      Unsteady when walking, needs to hold onto things 5. ONSET:  "When did the dizziness begin?"     4 days ago 6. AGGRAVATING FACTORS: "Does anything make it worse?" (e.g., standing, change in head position)     Getting up from sitting or lying  8. CAUSE: "What do you think is causing the dizziness?"     Not sure 9. RECURRENT SYMPTOM: "Have you had dizziness before?" If Yes, ask: "When was the last time?" "What  happened that time?"     Hx of vertigo 10. OTHER SYMPTOMS: "Do you have any other symptoms?" (e.g., fever, chest pain, vomiting, diarrhea, bleeding)       Headache, post nasal drip, sinus pain/congestion, chest pain yesterday (started on right side up above boob and below shoulder)  Answer Assessment - Initial Assessment Questions 1. LOCATION: "Where does it hurt?"       No CP today. Not since yesterday. Yesterday it was right side of chest, left side of chest, left breast. 2. RADIATION: "Does the pain go anywhere  else?" (e.g., into neck, jaw, arms, back)     Started right side of chest, then across, and then yesterday it was on the left side. Pain radiated down the left arm. Pain in left breast "been going on for a couple weeks." Wearing brace on left arm. 3. ONSET: "When did the chest pain begin?" (Minutes, hours or days)      Day before yesterday for 15 minutes. Yesterday it lasted 15 minutes. 4. PATTERN: "Does the pain come and go, or has it been constant since it started?"  "Does it get worse with exertion?"      Comes and goes. 5. DURATION: "How long does it last" (e.g., seconds, minutes, hours)     15 minute duration 6. SEVERITY: "How bad is the pain?"  (e.g., Scale 1-10; mild, moderate, or severe)    - MILD (1-3): doesn't interfere with normal activities     - MODERATE (4-7): interferes with normal activities or awakens from sleep    - SEVERE (8-10): excruciating pain, unable to do any normal activities       "Mild type of pain", at one point it wsa a stabbing pain, then a burning pain, when it was all across my chest it was really weird. 5/10. 7. CARDIAC RISK FACTORS: "Do you have any history of heart problems or risk factors for heart disease?" (e.g., angina, prior heart attack; diabetes, high blood pressure, high cholesterol, smoker, or strong family history of heart disease)     Hypertension, hyperlipidemia in chart. Hx of COPD in chart but pt states she doesn't have that anymore. Uses a nebulizer if needed but she hasn't needed that in years. 8. PULMONARY RISK FACTORS: "Do you have any history of lung disease?"  (e.g., blood clots in lung, asthma, emphysema, birth control pills)     Hx of asthma, COPD. Pt states COPD is resolved. Pt has not had asthmatic episodes lately. Pt states she was sent to a a doctor last year for another breathing treatment, dr. Prentiss Bells pt on pulmicort but she didn't have symptoms then. The pulmicort "gave me asthma." Pt endorses Sob and wheezing with pulmicort.  9. CAUSE:  "What do you think is causing the chest pain?"     Not sure 10. OTHER SYMPTOMS: "Do you have any other symptoms?" (e.g., dizziness, nausea, vomiting, sweating, fever, difficulty breathing, cough)       Dizziness (pt states she is able to walk, but slow, and pt states she "holds on.") Pt states sitting or lying feels better. Endorses dizziness makes her nauseous. Endorses post nasal drip and sinus congestion pain on the face. Pt states "drip" comes out of her nose, feels like pressure on forehead and across bridge of nose, underneath her eyes and on top of cheeks. Pt endorses being sweaty this AM. No SOB. Pt took antihistamine last night and layed down in bed and "had no energy at all"  Protocols used: Dizziness -  Lightheadedness-A-AH, Chest Pain-A-AH

## 2023-06-17 NOTE — Telephone Encounter (Signed)
Patient called from the ED to report that she has not been evaluated yet. I advised the patient that we are unable to reduce the wait time and that the ED is the best place for her to be evaluated at this time. Patient verbalized understanding.   Copied from CRM [id number]. Topic: Clinical - Red Word Triage >> Jun 17, 2023  2:31 PM Elizebeth Brooking wrote: [facility] that prompted transfer to Nurse Triage: Patient is experiencing  dizziness, stated she feels like she has pressure in her head. Has alittle post nasal drip having been coughing or running a fever of anykind . Scared she doesn't want to fall and break her hip

## 2023-06-17 NOTE — Telephone Encounter (Signed)
Please call patient to follow up

## 2023-06-17 NOTE — ED Triage Notes (Signed)
Patient c/o dizziness, chest pain, nausea, and headache for a few days. Reports cold sweats yesterday and today.   A&O x4 during triage

## 2023-06-17 NOTE — Progress Notes (Unsigned)
[facility] Counselor/Therapist Progress Note  Patient ID: Brittney Ruiz, MRN: [id number]    Date: 06/17/23  Time Spent: ***  {LBBHAMPM:26719} - *** {LBBHAMPM:26719} : *** Minutes  Treatment Type: Individual Therapy.  Reported Symptoms: ***  Mental Status Exam: Appearance:  {PSY:22683}     Behavior: {PSY:21022743}  Motor: {PSY:22302}  Speech/Language:  {PSY:22685}  Affect: {PSY:22687}  Mood: {PSY:31886}  Thought process: {PSY:31888}  Thought content:   {[phone]}  Sensory/Perceptual disturbances:   {PSY:[phone]}  Orientation: {PSY:30297}  Attention: {PSY:22877}  Concentration: {[phone] [phone]}  Memory: {[phone]}  Fund of knowledge:  {[phone] 224-3926}  Insight:   {[phone] 224-3926}  Judgment:  {PSY:([phone] 224-3926}  Impulse Control: {[phone] [phone]}   Risk Assessment: Danger to Self:  {PSY:22692} Self-injurious Behavior: {PSY:22692} Danger to Others: {PSY:22692} Duty to Warn:{PSY:311194} Physical Aggression / Violence:{PSY:21197} Access to Firearms a concern: {PSY:21197} Gang Involvement:{PSY:21197}  Subjective:   Brittney Ruiz participated from {Patient Location:26[address]::"home"}, via {LBBHVIDEOORPHONE:26720}, and consented to treatment. Therapist participated from {LBBHPROVIDERLOCATION:26721}. We met online due to COVID pandemic.   ***   Interventions: {PSY:[phone]}  Diagnosis: No diagnosis found.   Plan: ***Patient is to use CBT, mindfulness and coping skills to help manage decrease symptoms associated with their diagnosis.   Long-term goal:   ***Reduce overall level, frequency, and intensity of the feelings of depression, anxiety and panic evidenced by       decreased irritability, negative self talk, and helpless feelings from 6 to 7 days/week to 0 to 1 days/week per client report for at least 3 consecutive months.  Short-term goal:  ***Verbally express understanding of the relationship between feelings of depression, anxiety and their  impact on thinking patterns and behaviors. Verbalize an understanding of the role that distorted thinking plays in creating fears, excessive worry, and ruminations.  Phyllis Ginger MSW, LCSW/DATE

## 2023-06-17 NOTE — Telephone Encounter (Signed)
Reason for Disposition  [1] Follow-up call to recent contact AND [2] information only call, no triage required  Answer Assessment - Initial Assessment Questions 1. REASON FOR CALL or QUESTION: "What is your reason for calling today?" or "How can I best help you?" or "What question do you have that I can help answer?"     Patient reports still waiting in the ED.  Protocols used: Information Only Call - No Triage-A-AH

## 2023-06-17 NOTE — Telephone Encounter (Signed)
Called pt and her bp 138/98 she does not take 2nd dose of bp medication at 4. She is waiting for her husband to get home to take pt to the ER. She state no chest pain today, but the last 2 days they were on the right side then left side, and in the middle. She stated that she has an ENT appointment next Friday, but wants to know why she has been dizzy the last 4 days.

## 2023-06-17 NOTE — Telephone Encounter (Signed)
In the ED

## 2023-06-17 NOTE — ED Provider Triage Note (Signed)
Emergency Medicine Provider Triage Evaluation Note  Brittney Ruiz, a 58 y.o. female  was evaluated in triage.  Pt complains of dizziness, balance issues, nausea w/o vomiting, no appetite for 3-5 days;  and soft stools yesterday. She would endorse left chest pain 3 days ago; that has referred to the central and right chest.   Review of Systems  Positive: Dizziness, weakness Negative: FCS  Physical Exam  LMP  (LMP Unknown)  Gen:   Awake, no distress  NAD Resp:  Normal effort CTA MSK:   Moves extremities without difficulty  Other:    Medical Decision Making  Medically screening exam initiated at 5:35 PM.  Appropriate orders placed.  Brittney Ruiz was informed that the remainder of the evaluation will be completed by another provider, this initial triage assessment does not replace that evaluation, and the importance of remaining in the ED until their evaluation is complete.  Patient to the ED for evlauation of dizziness, weakness, and chest pain.    Lissa Hoard, PA-C 06/17/23 [phone]

## 2023-06-18 ENCOUNTER — Emergency Department
Admission: EM | Admit: 2023-06-18 | Discharge: 2023-06-18 | Disposition: A | Discharge status: Home / Self Care | Payer: Medicare HMO | Attending: Emergency Medicine | Admitting: Emergency Medicine

## 2023-06-18 DIAGNOSIS — R42 Dizziness and giddiness: Secondary | ICD-10-CM

## 2023-06-18 DIAGNOSIS — R5381 Other malaise: Secondary | ICD-10-CM

## 2023-06-18 LAB — TROPONIN I (HIGH SENSITIVITY): Troponin I (High Sensitivity): 3 ng/L (ref <18)

## 2023-06-18 NOTE — Telephone Encounter (Signed)
 Noted

## 2023-06-18 NOTE — ED Provider Notes (Signed)
[facility] Provider Note    Event Date/Time   First MD Initiated Contact with Patient 06/18/23 [phone]     (approximate)   History   Dizziness and Chest Pain   HPI Brittney Ruiz is a 58 y.o. female with extensive chronic medical history (including vertigo ) and 43 listed allergies or contraindications to medications.  She presents for evaluation of several days of general malaise, fatigue, loss of appetite, body aches, intermittent chest pain, abnormal feeling in her ears, and dizziness when she stands up and walks around.  She said that the dizziness is not anywhere near as bad as she had in the past when she was unable to get out of bed without vomiting due to the room spinning and when she had to have an Epley maneuver, but it is still frustrating for her.  She said that she has schedule an appointment with ENT but the appointment is not for another week and a half.  She tried scheduling an appointment with her primary care provider but when she mention chest pain she was told to go directly to the emergency department.  She has some nausea occasionally but no vomiting.  Mostly she says she just does not feel well in general.     Physical Exam   Triage Vital Signs: ED Triage Vitals [06/17/23 1805]  Encounter Vitals Group     BP (!) 137/90     Systolic BP Percentile      Diastolic BP Percentile      Pulse Rate 71     Resp 18     Temp 98.1 F (36.7 C)     Temp Source Oral     SpO2 93 %     Weight 94.8 kg (209 lb)     Height 1.575 m (5\' 2" )     Head Circumference      Peak Flow      Pain Score 4     Pain Loc      Pain Education      Exclude from Growth Chart     Most recent vital signs: Vitals:   06/17/23 1805 06/18/23 0017  BP: (!) 137/90 (!) 134/90  Pulse:  62  Resp: 18 17  Temp: 98.1 F (36.7 C) 98 F (36.7 C)  SpO2: 93% 93%    General: no obvious distress.  Conversant, awake and alert and oriented. CV:  Good peripheral perfusion.   Regular rate and rhythm Resp:  Normal effort. Speaking easily and comfortably, no accessory muscle usage nor intercostal retractions.  Lungs clear to auscultation. Abd:  Obese, nontender to palpation throughout. Other:  Ears are clean and clear and nonerythematous bilaterally with no evidence of effusion on either side.  Neuro exam is reassuring:  Pupils are equal and reactive, extraocular motion intact, no nystagmus.  No dysmetria on finger-to-nose testing.  Equal strength in major muscle groups.   ED Results / Procedures / Treatments   Labs (all labs ordered are listed, but only abnormal results are displayed) Labs Reviewed  CBC - Abnormal; Notable for the following components:      Result Value   RBC 5.29 (*)    Hemoglobin 15.5 (*)    All other components within normal limits  RESP PANEL BY RT-PCR (RSV, FLU A&B, COVID)  [id number]  BASIC METABOLIC PANEL  TROPONIN I (HIGH SENSITIVITY)  TROPONIN I (HIGH SENSITIVITY)     EKG  ED ECG REPORT I, Loleta Rose, the attending physician, personally  viewed and interpreted this ECG.  Date: 06/17/2023 EKG Time: 18: 08 Rate: 67 Rhythm: normal sinus rhythm QRS Axis: normal Intervals: normal ST/T Wave abnormalities: Inverted T waves in lead III, otherwise unremarkable Narrative Interpretation: no evidence of acute ischemia    RADIOLOGY I viewed and interpreted the patient's two-view chest x-ray there is no evidence of pneumonia or pulmonary edema or pneumothorax   PROCEDURES:  Critical Care performed: No  Procedures    IMPRESSION / MDM / ASSESSMENT AND PLAN / ED COURSE  I reviewed the triage vital signs and the nursing notes.                              Differential diagnosis includes, but is not limited to, benign vertigo, central vertigo (CVA), metabolic or electrolyte abnormality, viral illness, depression, medication or drug side effect.  Patient's presentation is most consistent with acute presentation with potential  threat to life or bodily function.  Labs/studies ordered: EKG, high-sensitivity troponin x 2, respiratory viral panel, BMP, CBC, two-view chest x-ray  Interventions/Medications given:  Medications - No data to display  (Note:  hospital course my include additional interventions and/or labs/studies not listed above.)   Patient appears well and is in no distress.  Vital signs are stable and within normal limits.  Workup is very reassuring with essentially normal labs, nonischemic EKG, etc.  Physical exam is equally reassuring with no appreciable focal neurological deficits.  No indication for imaging at this time.  I talked with the patient about how she may have a viral illness that is affecting her and that she needs to stay hydrated and eat even if she does not have an appetite.  We even talked about the role that depression may play in her symptoms.  The patient's medical screening exam is reassuring with no indication of an emergent medical condition requiring hospitalization or additional evaluation at this point.  The patient is safe and appropriate for discharge and outpatient follow up with primary care provider and with ENT as scheduled.  I recommended that she try over-the-counter Dramamine since she reports allergies to so many things including meclizine and she is interested in giving this a try.  I gave my usual and customary return precautions.      FINAL CLINICAL IMPRESSION(S) / ED DIAGNOSES   Final diagnoses:  Dizziness  Malaise and fatigue     Rx / DC Orders   ED Discharge Orders     None        Note:  This document was prepared using Dragon voice recognition software and may include unintentional dictation errors.   Loleta Rose, MD 06/18/23 [phone]

## 2023-06-18 NOTE — Discharge Instructions (Addendum)
Your workup in the Emergency Department today was reassuring.  We did not find any specific abnormalities.  We recommend you drink plenty of fluids, take your regular medications and/or any new ones prescribed today, and follow up with the doctor(s) listed in these documents as recommended.  You can consider trying over-the-counter Dramamine according to label instructions which may help with your dizziness.  Return to the Emergency Department if you develop new or worsening symptoms that concern you.

## 2023-06-18 NOTE — Telephone Encounter (Signed)
FYI pt currently in the ED.

## 2023-06-22 ENCOUNTER — Telehealth: Payer: Self-pay

## 2023-06-22 NOTE — Transitions of Care (Post Inpatient/ED Visit) (Signed)
 06/22/2023  Name: Brittney Ruiz MRN: [id number] DOB: 1965-10-23  Today's TOC FU Call Status: Today's TOC FU Call Status:: Successful TOC FU Call Completed TOC FU Call Complete Date: 06/22/23 Patient's Name and Date of Birth confirmed.  Transition Care Management Follow-up Telephone Call Date of Discharge: 06/18/23 Discharge Facility: [facility]) Type of Discharge: Emergency Department Reason for ED Visit: Other: (dizziness and giddiness, other fatigue) How have you been since you were released from the hospital?: Better Any questions or concerns?: No  Items Reviewed: Did you receive and understand the discharge instructions provided?: Yes Medications obtained,verified, and reconciled?: Yes (Medications Reviewed) Any new allergies since your discharge?: No Dietary orders reviewed?: NA Do you have support at home?: Yes  Medications Reviewed Today: Medications Reviewed Today     Reviewed by Shay Bartoli, [place] LABOR, CMA (Certified Medical Assistant) on 06/22/23 at 1624  Med List Status: <None>   Medication Order Taking? Sig Documenting Provider Last Dose Status Informant  albuterol  (VENTOLIN  HFA) 108 (90 Base) MCG/ACT inhaler [id number] No USE 2 INHALATIONS ORALLY   EVERY 6 HOURS AS NEEDED. Tamea Dedra CROME, MD Taking Active   Alcohol  Swabs (B-D SINGLE USE SWABS REGULAR) PADS [id number] No USE 1 SWAB TOPICALLY TO    CLEAN FINGER BEFORE        CHECKING GLUCOSE Dineen Rollene MATSU, FNP Taking Active   aspirin EC 81 MG tablet [id number] No Take 81 mg by mouth at bedtime. [provider] Taking Active Self  B Complex Vitamins (BL VITAMIN B COMPLEX PO) [id number] No +Folic acid  qd. [provider] Taking Active Self  Blood Glucose Monitoring Suppl (TRUE METRIX METER) w/Device KIT [id number] No USED TO CHECK GLUCOSE READINGS  ONE TIME DAILY AS NEEDED. Dineen Rollene MATSU, FNP Taking Active   clorazepate (TRANXENE) 7.5 MG tablet [id number] No Take 7.5 mg by  mouth 3 (three) times daily. [provider] Taking Active Self  fexofenadine  (ALLEGRA ) 60 MG tablet [id number] No Take 1 tablet (60 mg total) by mouth daily.  Patient taking differently: Take 180 mg by mouth daily.   Dineen Rollene MATSU, FNP Taking Active   fluticasone  (FLONASE ) 50 MCG/ACT nasal spray [id number] No USE 1 SPRAY IN EACH NOSTRILAT BEDTIME Dineen Rollene MATSU, FNP Taking Active   glucose blood (TRUE METRIX BLOOD GLUCOSE TEST) test strip [id number] No TEST BLOOD SUGAR ONE TIME DAILY Dineen Rollene MATSU, FNP Taking Active   metFORMIN  (GLUCOPHAGE ) 500 MG tablet [id number]  1/2 po with lunch daily Midge Sober, DO  Active   Multiple Vitamin (MULTIVITAMIN) tablet [id number] No Take 1 tablet by mouth daily. [provider] Taking Active Self  Omega-3 Fatty Acids (FISH OIL) 1000 MG CAPS [id number] No Take 2 capsules by mouth daily. [provider] Taking Active Self  omeprazole  (PRILOSEC) 20 MG capsule [id number] No TAKE 1 CAPSULE DAILY Arnett, Rollene MATSU, FNP Taking Active   polyethylene glycol powder (GLYCOLAX /MIRALAX ) 17 GM/SCOOP powder [id number] No Take 1 Container by mouth at bedtime. [provider] Taking Active Self  propranolol  (INDERAL ) 20 MG tablet [id number] No Take 1 tablet (20 mg total) by mouth 3 (three) times daily. Vannie Delon LABOR, MD Taking Active Self  psyllium (METAMUCIL) 58.6 % powder [id number] No Take 1 packet by mouth daily. [provider] Taking Active Self  sertraline  (ZOLOFT ) 100 MG tablet [id number] No TAKE 1+1/2 TABLETS BY MOUTH DAILY AS DIRECTED  Patient taking differently: Take 112.5 mg by mouth See admin instructions. 112.5 in the morning and 25 mg  in the evening   Vannie Delon LABOR, MD Taking Active   TRUEplus Lancets 33G MISC [id number] No TEST BLOOD SUGAR ONE TIME DAILY Dineen Rollene MATSU, FNP Taking Active             Home Care and Equipment/Supplies: Were Home Health Services Ordered?: NA Any new equipment or  medical supplies ordered?: NA  Functional Questionnaire: Do you need assistance with bathing/showering or dressing?: No Do you need assistance with meal preparation?: No Do you need assistance with eating?: No Do you have difficulty maintaining continence: No Do you need assistance with getting out of bed/getting out of a chair/moving?: No Do you have difficulty managing or taking your medications?: No  Follow up appointments reviewed:      Lyndall Bellot, CMA  [facility] AWV Team Direct Dial: [phone]

## 2023-06-23 ENCOUNTER — Encounter: Payer: Self-pay | Admitting: Internal Medicine

## 2023-06-23 NOTE — Telephone Encounter (Signed)
 noted

## 2023-06-23 NOTE — Telephone Encounter (Signed)
 Spoke to pt she has virtual appt to see you on Monday to discuss

## 2023-06-24 DIAGNOSIS — R519 Headache, unspecified: Secondary | ICD-10-CM | POA: Diagnosis not present

## 2023-06-24 DIAGNOSIS — Z87891 Personal history of nicotine dependence: Secondary | ICD-10-CM | POA: Diagnosis not present

## 2023-06-24 DIAGNOSIS — R42 Dizziness and giddiness: Secondary | ICD-10-CM | POA: Diagnosis not present

## 2023-06-24 DIAGNOSIS — R202 Paresthesia of skin: Secondary | ICD-10-CM | POA: Diagnosis not present

## 2023-06-24 DIAGNOSIS — H903 Sensorineural hearing loss, bilateral: Secondary | ICD-10-CM | POA: Diagnosis not present

## 2023-06-24 DIAGNOSIS — R29810 Facial weakness: Secondary | ICD-10-CM | POA: Diagnosis not present

## 2023-06-24 DIAGNOSIS — R531 Weakness: Secondary | ICD-10-CM | POA: Diagnosis not present

## 2023-06-24 DIAGNOSIS — R61 Generalized hyperhidrosis: Secondary | ICD-10-CM | POA: Diagnosis not present

## 2023-06-24 DIAGNOSIS — R0789 Other chest pain: Secondary | ICD-10-CM | POA: Diagnosis not present

## 2023-06-25 DIAGNOSIS — R42 Dizziness and giddiness: Secondary | ICD-10-CM | POA: Diagnosis not present

## 2023-06-25 DIAGNOSIS — I071 Rheumatic tricuspid insufficiency: Secondary | ICD-10-CM | POA: Diagnosis not present

## 2023-06-25 DIAGNOSIS — Z5986 Financial insecurity: Secondary | ICD-10-CM | POA: Diagnosis not present

## 2023-06-25 DIAGNOSIS — R200 Anesthesia of skin: Secondary | ICD-10-CM | POA: Diagnosis not present

## 2023-06-25 DIAGNOSIS — E785 Hyperlipidemia, unspecified: Secondary | ICD-10-CM | POA: Diagnosis not present

## 2023-06-25 DIAGNOSIS — Z79899 Other long term (current) drug therapy: Secondary | ICD-10-CM | POA: Diagnosis not present

## 2023-06-25 DIAGNOSIS — R918 Other nonspecific abnormal finding of lung field: Secondary | ICD-10-CM | POA: Diagnosis not present

## 2023-06-25 DIAGNOSIS — I10 Essential (primary) hypertension: Secondary | ICD-10-CM | POA: Diagnosis not present

## 2023-06-25 DIAGNOSIS — R531 Weakness: Secondary | ICD-10-CM | POA: Diagnosis not present

## 2023-06-25 DIAGNOSIS — I69322 Dysarthria following cerebral infarction: Secondary | ICD-10-CM | POA: Diagnosis not present

## 2023-06-25 DIAGNOSIS — R1312 Dysphagia, oropharyngeal phase: Secondary | ICD-10-CM | POA: Diagnosis not present

## 2023-06-25 DIAGNOSIS — Z8616 Personal history of COVID-19: Secondary | ICD-10-CM | POA: Diagnosis not present

## 2023-06-25 DIAGNOSIS — Z91048 Other nonmedicinal substance allergy status: Secondary | ICD-10-CM | POA: Diagnosis not present

## 2023-06-25 DIAGNOSIS — Z85820 Personal history of malignant melanoma of skin: Secondary | ICD-10-CM | POA: Diagnosis not present

## 2023-06-25 DIAGNOSIS — Z888 Allergy status to other drugs, medicaments and biological substances status: Secondary | ICD-10-CM | POA: Diagnosis not present

## 2023-06-25 DIAGNOSIS — R202 Paresthesia of skin: Secondary | ICD-10-CM | POA: Diagnosis not present

## 2023-06-25 DIAGNOSIS — Z5948 Other specified lack of adequate food: Secondary | ICD-10-CM | POA: Diagnosis not present

## 2023-06-25 DIAGNOSIS — R519 Headache, unspecified: Secondary | ICD-10-CM | POA: Diagnosis not present

## 2023-06-25 DIAGNOSIS — F329 Major depressive disorder, single episode, unspecified: Secondary | ICD-10-CM | POA: Diagnosis not present

## 2023-06-25 DIAGNOSIS — G629 Polyneuropathy, unspecified: Secondary | ICD-10-CM | POA: Diagnosis not present

## 2023-06-25 DIAGNOSIS — F459 Somatoform disorder, unspecified: Secondary | ICD-10-CM | POA: Diagnosis not present

## 2023-06-25 DIAGNOSIS — J4489 Other specified chronic obstructive pulmonary disease: Secondary | ICD-10-CM | POA: Diagnosis not present

## 2023-06-25 DIAGNOSIS — Z7982 Long term (current) use of aspirin: Secondary | ICD-10-CM | POA: Diagnosis not present

## 2023-06-25 DIAGNOSIS — R079 Chest pain, unspecified: Secondary | ICD-10-CM | POA: Diagnosis not present

## 2023-06-25 DIAGNOSIS — R29810 Facial weakness: Secondary | ICD-10-CM | POA: Diagnosis not present

## 2023-06-25 DIAGNOSIS — R61 Generalized hyperhidrosis: Secondary | ICD-10-CM | POA: Diagnosis not present

## 2023-06-25 DIAGNOSIS — Z59811 Housing instability, housed, with risk of homelessness: Secondary | ICD-10-CM | POA: Diagnosis not present

## 2023-06-25 DIAGNOSIS — R0789 Other chest pain: Secondary | ICD-10-CM | POA: Diagnosis not present

## 2023-06-25 DIAGNOSIS — Z87891 Personal history of nicotine dependence: Secondary | ICD-10-CM | POA: Diagnosis not present

## 2023-06-25 DIAGNOSIS — Z5941 Food insecurity: Secondary | ICD-10-CM | POA: Diagnosis not present

## 2023-06-25 DIAGNOSIS — K219 Gastro-esophageal reflux disease without esophagitis: Secondary | ICD-10-CM | POA: Diagnosis not present

## 2023-06-27 ENCOUNTER — Encounter: Payer: Self-pay | Admitting: Family

## 2023-06-28 ENCOUNTER — Telehealth (INDEPENDENT_AMBULATORY_CARE_PROVIDER_SITE_OTHER): Payer: Medicare HMO | Admitting: Family

## 2023-06-28 ENCOUNTER — Telehealth: Payer: Self-pay | Admitting: Family

## 2023-06-28 ENCOUNTER — Ambulatory Visit: Payer: Medicare HMO | Attending: Family

## 2023-06-28 ENCOUNTER — Telehealth: Payer: Self-pay

## 2023-06-28 ENCOUNTER — Encounter: Payer: Self-pay | Admitting: Family

## 2023-06-28 VITALS — Ht 62.0 in | Wt 203.0 lb

## 2023-06-28 DIAGNOSIS — R911 Solitary pulmonary nodule: Secondary | ICD-10-CM | POA: Diagnosis not present

## 2023-06-28 DIAGNOSIS — R42 Dizziness and giddiness: Secondary | ICD-10-CM

## 2023-06-28 NOTE — Assessment & Plan Note (Addendum)
 Reviewed hospital admission.  Medications reconciled.  Differential includes  persistent postural perceptual dizziness. She has upcoming PT appointment for vestibular rehab. Pending zio monitor to ensure no underlying arrhyhtmia.

## 2023-06-28 NOTE — Patient Instructions (Signed)
 I will reach out to Pulmonology in regards to monitoring lung nodule  Please start vestibular rehab    I have ordered a 14 day Zio monitor which will mailed directly to you. The device will include instructions on how to apply.   The Zio 14 day monitor that we use DOES NOT have 24 hour live monitoring. The rhythm of your heart will not be monitored while you are wearing it. Only AFTER you mail the device back in and a cardiologist interprets the data will we know if an underlying cardiac arrhythmia is going on.   There are models that offer 24 hour live monitoring however NOT this one. I wanted to be sure that you were aware of this as certainly didn't want this to be a false sense of security.   If you experience chest pain, shortness of breath, left arm numbness, or sustained, more frequent palpitations , do not wait and call 911 right away. We are in a delicate period of work up regarding palpitations and until we are sure that you do not have an underlying arrhythmia, you must be extremely vigilant and cautious.      ZIO XT- Long Term Monitor Instructions   Your provider has requested you wear a ZIO patch monitor for 14 days.  This is a single patch monitor. [employer] supplies one patch monitor per enrollment. Additional stickers are not available. Please do not apply patch if you will be having a Nuclear Stress Test,  Echocardiogram, Cardiac CT, MRI, or Chest Xray during the period you would be wearing the  monitor. The patch cannot be worn during these tests. You cannot remove and re-apply the  ZIO XT patch monitor.  Your ZIO patch monitor will be mailed 3 day USPS to your address on file. It may take 3-5 days  to receive your monitor after you have been enrolled.  Once you have received your monitor, please review the enclosed instructions. Your monitor  has already been registered assigning a specific monitor serial # to you.   Billing and Patient Assistance Program Information    We have supplied [employer] with any of your insurance information on file for billing purposes. [employer] offers a sliding scale Patient Assistance Program for patients that do not have  insurance, or whose insurance does not completely cover the cost of the ZIO monitor.  You must apply for the Patient Assistance Program to qualify for this discounted rate.  To apply, please call [employer] at [phone], select option 4, select option 2, ask to apply for  Patient Assistance Program. Sanna Crystal will ask your household income, and how many people  are in your household. They will quote your out-of-pocket cost based on that information.  [employer] will also be able to set up a 13-month, interest-free payment plan if needed.   Applying the monitor   Shave hair from upper left chest.  Hold abrader disc by orange tab. Rub abrader in 40 strokes over the upper left chest as  indicated in your monitor instructions.  Clean area with 4 enclosed alcohol  pads. Let dry.  Apply patch as indicated in monitor instructions. Patch will be placed under collarbone on left  side of chest with arrow pointing upward.  Rub patch adhesive wings for 2 minutes. Remove white label marked "1". Remove the white  label marked "2". Rub patch adhesive wings for 2 additional minutes.  While looking in a mirror, press and release button in center of patch. A small green light will  flash 3-4 times. This will be your only indicator that the monitor has been turned on.  Do not shower for the first 24 hours. You may shower after the first 24 hours.  Press the button if you feel a symptom. You will hear a small click. Record Date, Time and  Symptom in the Patient Logbook.  When you are ready to remove the patch, follow instructions on the last 2 pages of Patient  Logbook. Stick patch monitor onto the last page of Patient Logbook.  Place Patient Logbook in the blue and white box. Use locking tab on box and tape box closed  securely. The  blue and white box has prepaid postage on it. Please place it in the mailbox as  soon as possible. Your physician should have your test results approximately 7 days after the  monitor has been mailed back to [facility]  Call [facility] Customer Care at [phone] if you have questions regarding  your ZIO XT patch monitor. Call them immediately if you see an orange light blinking on your  monitor.  If your monitor falls off in less than 4 days, contact our Monitor department at [phone].  If your monitor becomes loose or falls off after 4 days call [employer] at [phone] for  suggestions on securing your monitor

## 2023-06-28 NOTE — Assessment & Plan Note (Signed)
 Incidentally found 8mm irregular lung nodule during hospitalization. She has h/o lung nodule. I have reached out to pulmonology in regards to updating, surveillance of lung nodule. H/o smoking.

## 2023-06-28 NOTE — Transitions of Care (Post Inpatient/ED Visit) (Signed)
 06/28/2023  Name: Brittney Ruiz MRN: [id number] DOB: [birth year]  Today's TOC FU Call Status: Today's TOC FU Call Status:: Successful TOC FU Call Completed TOC FU Call Complete Date: 06/28/23 Patient's Name and Date of Birth confirmed.  Transition Care Management Follow-up Telephone Call Date of Discharge: 06/26/23 Discharge Facility: Other ([facility]) Name of Other ([facility]) Discharge Facility: [facility] Type of Discharge: Inpatient Admission Primary Inpatient Discharge Diagnosis:: Dizziness Reason for ED Visit: Other: (Dizziness rule out stroke) How have you been since you were released from the hospital?: Better Any questions or concerns?: No  Items Reviewed: Did you receive and understand the discharge instructions provided?: Yes Medications obtained,verified, and reconciled?: Yes (Medications Reviewed) Any new allergies since your discharge?: No Dietary orders reviewed?: Yes Type of Diet Ordered:: Low sodium heart healthy Do you have support at home?: Yes People in Home: spouse Name of Support/Comfort Primary Source: Andree Kayser, spouse  Medications Reviewed Today: Medications Reviewed Today     Reviewed by Claudene Crystal, RN (Case Manager) on 06/28/23 at 1537  Med List Status: <None>   Medication Order Taking? Sig Documenting Provider Last Dose Status Informant  albuterol  (VENTOLIN  HFA) 108 (90 Base) MCG/ACT inhaler [id number]  USE 2 INHALATIONS ORALLY   EVERY 6 HOURS AS NEEDED. Marc Senior, MD  Active   Alcohol  Swabs (B-D SINGLE USE SWABS REGULAR) PADS [id number]  USE 1 SWAB TOPICALLY TO    CLEAN FINGER BEFORE        CHECKING GLUCOSE Calista Catching, FNP  Active   aspirin EC 81 MG tablet [id number]  Take 81 mg by mouth at bedtime. [provider]  Active Self  B Complex Vitamins (BL VITAMIN B COMPLEX PO) [id number]  +Folic acid  qd. [provider]  Active Self  Blood Glucose Monitoring Suppl (TRUE METRIX METER) w/Device KIT [id number]   USED TO CHECK GLUCOSE READINGS  ONE TIME DAILY AS NEEDED. Calista Catching, FNP  Active   clorazepate (TRANXENE) 7.5 MG tablet [id number]  Take 7.5 mg by mouth 3 (three) times daily. [provider]  Active Self  fexofenadine  (ALLEGRA ) 60 MG tablet [id number]  Take 1 tablet (60 mg total) by mouth daily.  Patient taking differently: Take 180 mg by mouth daily.   Calista Catching, FNP  Active   fluticasone  (FLONASE ) 50 MCG/ACT nasal spray [id number]  USE 1 SPRAY IN EACH NOSTRILAT BEDTIME Calista Catching, FNP  Active   glucose blood (TRUE METRIX BLOOD GLUCOSE TEST) test strip [id number]  TEST BLOOD SUGAR ONE TIME DAILY Calista Catching, FNP  Active   metFORMIN  (GLUCOPHAGE ) 500 MG tablet [id number]  1/2 po with lunch daily Marceil Sensor, DO  Active   Multiple Vitamin (MULTIVITAMIN) tablet [id number]  Take 1 tablet by mouth daily. [provider]  Active Self  Omega-3 Fatty Acids (FISH OIL) 1000 MG CAPS [id number]  Take 2 capsules by mouth daily. [provider]  Active Self  omeprazole  (PRILOSEC) 20 MG capsule [id number]  TAKE 1 CAPSULE DAILY Arnett, Hanley Lew, FNP  Active   ondansetron  (ZOFRAN -ODT) 4 MG disintegrating tablet [id number]  Take 2 mg by mouth every 6 (six) hours as needed. [provider]  Active   polyethylene glycol powder (GLYCOLAX /MIRALAX ) 17 GM/SCOOP powder [id number]  Take 1 Container by mouth at bedtime. [provider]  Active Self  propranolol  (INDERAL ) 20 MG tablet [id number]  Take 1 tablet (20 mg total) by mouth 3 (three) times daily. Thomes Flicker, MD  Active Self  psyllium (  METAMUCIL) 58.6 % powder [id number]  Take 1 packet by mouth daily. [provider]  Active Self  sertraline  (ZOLOFT ) 100 MG tablet [id number]  TAKE 1+1/2 TABLETS BY MOUTH DAILY AS DIRECTED  Patient taking differently: Take 112.5 mg by mouth See admin instructions. 112.5 in the morning and 25 mg in the evening   Thomes Flicker, MD  Active    TRUEplus Lancets 33G MISC [id number]  TEST BLOOD SUGAR ONE TIME DAILY Calista Catching, FNP  Active             Home Care and Equipment/Supplies: Were Home Health Services Ordered?: NA Any new equipment or medical supplies ordered?: NA  Functional Questionnaire: Do you need assistance with bathing/showering or dressing?: No Do you need assistance with meal preparation?: No Do you need assistance with eating?: No Do you have difficulty maintaining continence: No Do you need assistance with getting out of bed/getting out of a chair/moving?: No Do you have difficulty managing or taking your medications?: No  Follow up appointments reviewed: PCP Follow-up appointment confirmed?: Yes MD Provider Line Number:[phone] Given: No Date of PCP follow-up appointment?: 06/28/23 Follow-up Provider: Maybell Spates Follow-up appointment confirmed?: NA Do you need transportation to your follow-up appointment?: No Do you understand care options if your condition(s) worsen?: Yes-patient verbalized understanding  SDOH Interventions Today    Flowsheet Row Most Recent Value  SDOH Interventions   Food Insecurity Interventions Intervention Not Indicated  Housing Interventions Intervention Not Indicated  Transportation Interventions Intervention Not Indicated  Utilities Interventions Intervention Not Indicated  Social Connections Interventions Intervention Not Indicated      Interventions Today    Flowsheet Row Most Recent Value  Chronic Disease   Chronic disease during today's visit Hypertension (HTN)  General Interventions   General Interventions Discussed/Reviewed General Interventions Discussed  Exercise Interventions   Exercise Discussed/Reviewed Exercise Discussed  Education Interventions   Education Provided Provided Education  Provided Verbal Education On Insurance Plans, Medication  [Educated the patient about calling the number on the back of her  insurance card for additinal benefits]  Pharmacy Interventions   Pharmacy Dicussed/Reviewed Medications and their functions  Safety Interventions   Safety Discussed/Reviewed Safety Discussed       TOC Outreach to the patient regarding her hospital stay for dizziness. The patient states she is scheduling herself for vestibular rehab. She states she is dizzy only when she stands up and sometimes when she is walking. She completed her PCP appointment today. No concerns regarding discharge papers. Reviewed [employer] Benefits and behavioral health therapy. Declines TOC Outreach.   The patient has been provided with contact information for the care management team and has been advised to call with any health-related questions or concerns. The patient verbalized understanding with current POC. The patient is directed to their insurance card regarding availability of benefits coverage.   Gareld June, BSN, RN [place]  [facility] - [employer] [facility] RN Care Manager [phone]

## 2023-06-28 NOTE — Telephone Encounter (Signed)
 Pt was seen via  My chart today on 06/28/23

## 2023-06-28 NOTE — Telephone Encounter (Signed)
 Brittney Ruiz ,  Brittney Ruiz was hospitalized at [facility] for dizziness 06/25/23 Incidental lung nodule Incidentally found 8mm irregular lung nodule   She has CT lung cancer screen due 10/2022, would you recommend sooner CT chest?

## 2023-06-28 NOTE — Progress Notes (Signed)
 Virtual Visit via Video Note  I connected with Brittney Ruiz on 06/28/23 at 12:30 PM EST by a video enabled telemedicine application and verified that I am speaking with the correct person using two identifiers. Location patient: home Location provider: work  Persons participating in the virtual visit: patient, provider  I discussed the limitations of evaluation and management by telemedicine and the availability of in person appointments. The patient expressed understanding and agreed to proceed.  HPI:  Complains of dizziness for 18 days.   Dizziness is a little bit improved.  No dizziness with sitting, laying down  She feels dizzy when walking.  Denies CP, syncope.    She was seen by ENT Paola Bohr  last week  She is taking zofran  however she has not had to use.   She has resumed propranolol  since being home.   Admitted to 06/24/2023 for dizziness, left-sided vision changes, paresthesias.  Husband reported slurred speech evaluation for CVA.  Discharged 06/26/23  Pending referral to neurology, PT/OT  Start on aspirin 81 mg daily.  Incidentally found 8mm irregular lung nodule on CT PE Follow-up CT in 3 to 6 months  History of vertigo Echocardiogram normal left ventricular systolic function, mild LVH   CTH/CTA with no acute intracranial abnormalities, no significant stenosis or LVO. MRI brain without acute or prior infarct. A1c 5.6, LDL 82, TSH 3.27.   ENT evaluation and did not feel vestibular cause like BPPV.  Discharge note consistent with persistent postural perceptual dizziness  Referred for vestibular rehab.  Former smoker   MRI cervical spine 03/24/19 At C3-4 there is mild right facet arthropathy and mild right foraminal narrowing. No significant disc protrusion.  ROS: See pertinent positives and negatives per HPI.  EXAM:  VITALS per patient if applicable: Ht 5\' 2"  (1.575 m)   Wt 203 lb (92.1 kg)   LMP  (LMP Unknown)   BMI 37.13 kg/m  BP Readings from Last 3  Encounters:  06/18/23 123/71  06/01/23 121/78  05/04/23 124/78   Wt Readings from Last 3 Encounters:  06/28/23 203 lb (92.1 kg)  06/17/23 209 lb (94.8 kg)  06/01/23 207 lb (93.9 kg)    GENERAL: alert, oriented, appears well and in no acute distress  HEENT: atraumatic, conjunttiva clear, no obvious abnormalities on inspection of external nose and ears  NECK: normal movements of the head and neck  LUNGS: on inspection no signs of respiratory distress, breathing rate appears normal, no obvious gross SOB, gasping or wheezing  CV: no obvious cyanosis  MS: moves all visible extremities without noticeable abnormality  PSYCH/NEURO: pleasant and cooperative, no obvious depression or anxiety, speech and thought processing grossly intact  ASSESSMENT AND PLAN: Dizziness Assessment & Plan: Reviewed hospital admission.  Medications reconciled.  Differential includes  persistent postural perceptual dizziness. She has upcoming PT appointment for vestibular rehab. Pending zio monitor to ensure no underlying arrhyhtmia.     Orders: -     LONG TERM MONITOR (3-14 DAYS); Future  Lung nodule Assessment & Plan: Incidentally found 8mm irregular lung nodule during hospitalization. She has h/o lung nodule. I have reached out to pulmonology in regards to updating, surveillance of lung nodule. H/o smoking.         -we discussed possible serious and likely etiologies, options for evaluation and workup, limitations of telemedicine visit vs in person visit, treatment, treatment risks and precautions. Pt prefers to treat via telemedicine empirically rather then risking or undertaking an in person visit at this moment.  I discussed the assessment and treatment plan with the patient. The patient was provided an opportunity to ask questions and all were answered. The patient agreed with the plan and demonstrated an understanding of the instructions.   The patient was advised to call back or seek an  in-person evaluation if the symptoms worsen or if the condition fails to improve as anticipated.  Advised if desired AVS can be mailed or viewed via MyChart if Mychart user.   Bascom Bossier, FNP

## 2023-06-29 ENCOUNTER — Other Ambulatory Visit: Payer: Self-pay

## 2023-06-29 ENCOUNTER — Telehealth: Payer: Self-pay

## 2023-06-29 ENCOUNTER — Telehealth (INDEPENDENT_AMBULATORY_CARE_PROVIDER_SITE_OTHER): Payer: Medicare HMO | Admitting: Psychology

## 2023-06-29 ENCOUNTER — Ambulatory Visit (INDEPENDENT_AMBULATORY_CARE_PROVIDER_SITE_OTHER): Payer: Medicare HMO | Admitting: Physician Assistant

## 2023-06-29 DIAGNOSIS — F5089 Other specified eating disorder: Secondary | ICD-10-CM | POA: Diagnosis not present

## 2023-06-29 DIAGNOSIS — F419 Anxiety disorder, unspecified: Secondary | ICD-10-CM

## 2023-06-29 DIAGNOSIS — F32A Depression, unspecified: Secondary | ICD-10-CM | POA: Diagnosis not present

## 2023-06-29 NOTE — Progress Notes (Signed)
  Office: [phone]  /  Fax: [phone]    Date: June 29, 2023  Appointment Start Time: 4:01pm Duration: 44 minutes Provider: Lawerance Cruel, Psy.D. Type of Session: Individual Therapy  Location of Patient: Home (private location) Location of Provider: Provider's Home (private office) Type of Contact: Telepsychological Visit via MyChart Video Visit  Session Content: Makyra is a 58 y.o. female presenting for a follow-up appointment to address the previously established treatment goal of increasing coping skills.Today's appointment was a telepsychological visit. Sharita provided verbal consent for today's telepsychological appointment and she is aware she is responsible for securing confidentiality on her end of the session. Prior to proceeding with today's appointment, Dyane's physical location at the time of this appointment was obtained as well a phone number she could be reached at in the event of technical difficulties. Mayte and this provider participated in today's telepsychological service.   This provider conducted a brief check-in. Kirin discussed ongoing medical concerns and recent ER visits. Associated thoughts and feelings were processed. She reported a decreased appetite due to ongoing medical issues, but indicated she makes herself eat and focus on protein intake. At this time, she shared her eating habits "are not priority" as her physical health/medical concerns are priority. Further explored and processed. Overall, Janda was receptive to today's appointment as evidenced by openness to sharing and responsiveness to feedback as well as her sharing, "You're the only one who has been beneficial for me."  Mental Status Examination:  Appearance: neat Behavior: appropriate to circumstances Mood: depressed Affect: mood congruent Speech: WNL Eye Contact: intermittent Psychomotor Activity: WNL Gait: unable to assess Thought Process: linear, logical, and goal directed and denies  suicidal, homicidal, and self-harm ideation, plan and intent since the last appointment with this provider ("I want to live") Thought Content/Perception: no hallucinations, delusions, bizarre thinking or behavior endorsed or observed Orientation: AAOx4 Memory/Concentration: intact Insight: fair Judgment: fair  Interventions:  Conducted a brief chart review Provided empathic reflections and validation Provided positive reinforcement Employed supportive psychotherapy interventions to facilitate reduced distress and to improve coping skills with identified stressors  DSM-5 Diagnosis(es):  F50.89 Other Specified Feeding or Eating Disorder, Emotional Eating Behaviors, F41.9 Unspecified Anxiety Disorder, and  F32.A Unspecified Depressive Disorder  Treatment Goal & Progress: During the initial appointment with this provider, the following treatment goal was established: increase coping skills. Kamauri has demonstrated progress in her goal as evidenced by increased awareness of hunger patterns and increased awareness of triggers for emotional eating behaviors. Jaylinn also continues to demonstrate willingness to engage in learned skill(s).  Plan: Ardenia noted a plan to take a break with the clinic. Marlet will be initiating therapeutic services with Teofilo Pod LCSW with [facility] tomorrow. She will also be initiating psychiatric services with [facility]. She acknowledged understanding that she may request a follow-up appointment with this provider in the future as long as she is still established with the clinic. No further follow-up planned by this provider.      Lawerance Cruel, PsyD

## 2023-06-29 NOTE — Telephone Encounter (Signed)
Called and spoke with patient. Advises she was referred to [facility] for pulmonology f/u. Advises she is no longer eligible for the LCS program due to she quit smoking over 15 years ago. Advised this would be a nodule f/u and she could be seen in our pulmonology office even though she is no longer eligible for the program. Pt request a call back this Friday. States she was waiting to join a virtual appt online at time of call. Patient would like to think about her options. Advises she has been so busy with appts this week she just needs a few days to get things in order. Staff to call back later this week to confirm she would like to schedule repeat CT in April.

## 2023-06-29 NOTE — Telephone Encounter (Signed)
Spoke to [place] at [facility] she stated that she will fax over ov notes from 2025 fax # was given as well

## 2023-06-30 ENCOUNTER — Telehealth (INDEPENDENT_AMBULATORY_CARE_PROVIDER_SITE_OTHER): Payer: Self-pay | Admitting: Psychology

## 2023-06-30 ENCOUNTER — Ambulatory Visit: Payer: Medicare HMO | Admitting: Professional

## 2023-06-30 NOTE — Telephone Encounter (Signed)
Patient called wanting to speak with Dr. Dewaine Conger regarding the appointment that she was referred to.

## 2023-06-30 NOTE — Progress Notes (Unsigned)
Teofilo Pod, [facility]

## 2023-07-01 DIAGNOSIS — H5340 Unspecified visual field defects: Secondary | ICD-10-CM | POA: Diagnosis not present

## 2023-07-01 DIAGNOSIS — E782 Mixed hyperlipidemia: Secondary | ICD-10-CM | POA: Diagnosis not present

## 2023-07-02 NOTE — Telephone Encounter (Signed)
Returned call to see if patient had made a decision on f/u with the LCS program for [facility] or [facility]. No answer, LVM. Advised patient to call office back and advise on what she had decided and how we should move forward with scheduling her CT.

## 2023-07-05 ENCOUNTER — Telehealth: Payer: Self-pay

## 2023-07-05 DIAGNOSIS — R42 Dizziness and giddiness: Secondary | ICD-10-CM | POA: Diagnosis not present

## 2023-07-05 DIAGNOSIS — R260 Ataxic gait: Secondary | ICD-10-CM | POA: Diagnosis not present

## 2023-07-05 NOTE — Telephone Encounter (Signed)
Auth Submission: APPROVED Site of care: Site of care: [facility] Payer: [employer] medicare Medication & CPT/J Code(s) submitted: Leqvio (Inclisiran) [phone] Route of submission (phone, fax, portal): portal Phone # Fax # Auth type: Buy/Bill PB Units/visits requested: 284mg  x 2 doses Reference number: [id number] Approval from: 07/02/23 to 07/01/24

## 2023-07-06 ENCOUNTER — Telehealth (INDEPENDENT_AMBULATORY_CARE_PROVIDER_SITE_OTHER): Payer: Self-pay | Admitting: Psychology

## 2023-07-06 DIAGNOSIS — R32 Unspecified urinary incontinence: Secondary | ICD-10-CM | POA: Diagnosis not present

## 2023-07-06 DIAGNOSIS — Z87891 Personal history of nicotine dependence: Secondary | ICD-10-CM | POA: Diagnosis not present

## 2023-07-06 DIAGNOSIS — Z7982 Long term (current) use of aspirin: Secondary | ICD-10-CM | POA: Diagnosis not present

## 2023-07-06 DIAGNOSIS — Z833 Family history of diabetes mellitus: Secondary | ICD-10-CM | POA: Diagnosis not present

## 2023-07-06 DIAGNOSIS — I10 Essential (primary) hypertension: Secondary | ICD-10-CM | POA: Diagnosis not present

## 2023-07-06 DIAGNOSIS — Z809 Family history of malignant neoplasm, unspecified: Secondary | ICD-10-CM | POA: Diagnosis not present

## 2023-07-06 DIAGNOSIS — K219 Gastro-esophageal reflux disease without esophagitis: Secondary | ICD-10-CM | POA: Diagnosis not present

## 2023-07-06 DIAGNOSIS — J4489 Other specified chronic obstructive pulmonary disease: Secondary | ICD-10-CM | POA: Diagnosis not present

## 2023-07-06 DIAGNOSIS — Z8249 Family history of ischemic heart disease and other diseases of the circulatory system: Secondary | ICD-10-CM | POA: Diagnosis not present

## 2023-07-06 DIAGNOSIS — E785 Hyperlipidemia, unspecified: Secondary | ICD-10-CM | POA: Diagnosis not present

## 2023-07-06 DIAGNOSIS — M1990 Unspecified osteoarthritis, unspecified site: Secondary | ICD-10-CM | POA: Diagnosis not present

## 2023-07-06 NOTE — Telephone Encounter (Signed)
  Office: [phone]  /  Fax: [phone]  Date of Call: July 06, 2023  Time of Call: 8:37am Provider: Lawerance Cruel, PsyD  CONTENT: This provider was informed today via a routed MyChart message that Lesia called to discuss a referral placed by this provider. A HIPAA compliant voicemail was left requesting a call back.   PLAN: This provider will wait for Ziyan to call back. No further follow-up planned by this provider.

## 2023-07-12 ENCOUNTER — Telehealth (INDEPENDENT_AMBULATORY_CARE_PROVIDER_SITE_OTHER): Payer: Self-pay | Admitting: Psychology

## 2023-07-12 NOTE — Telephone Encounter (Signed)
  Office: [phone]  /  Fax: [phone]  Date of Call: July 12, 2023   Time of Call: 2:20pm Duration of Call: 15 minute(s) Provider: Lawerance Cruel, PsyD  CONTENT: This provider called Brittney Ruiz to check-in as she requested a call from this provider. She said she is "functioning." She shared about challenges with [facility], noting her new patient paperwork was lost and there was confusion regarding her appointment with Teofilo Pod, LCSW. Given the ongoing issues, she stated she is "trying to get in with [facility]." Nevertheless, she noted a plan to f/u with [facility]. No evidence or endorsement of any safety concerns. All questions/concerns addressed.   PLAN:  No further follow-up planned by this provider.

## 2023-07-13 NOTE — Telephone Encounter (Signed)
 noted

## 2023-07-20 ENCOUNTER — Ambulatory Visit: Payer: Medicare HMO

## 2023-07-20 DIAGNOSIS — H538 Other visual disturbances: Secondary | ICD-10-CM | POA: Diagnosis not present

## 2023-07-20 NOTE — Telephone Encounter (Signed)
 Patient called concerned that she received a bill for 01/19/23 DOS for Leqvio. I confirmed that it has been changed to a penny charge and the patient will not be responsible for the bill she received.  I called the patient back and gave her this information and she was appreciative, she also stated that she would no longer be taking Leqvio per Dr. Rennis Golden.

## 2023-07-22 ENCOUNTER — Ambulatory Visit: Admitting: Licensed Clinical Social Worker

## 2023-07-22 DIAGNOSIS — F331 Major depressive disorder, recurrent, moderate: Secondary | ICD-10-CM | POA: Diagnosis not present

## 2023-07-22 DIAGNOSIS — F411 Generalized anxiety disorder: Secondary | ICD-10-CM

## 2023-07-22 NOTE — Progress Notes (Signed)
 [facility] Counselor/Therapist Progress Note  Patient ID: Brittney Ruiz, MRN: [id number]    Date: 07/22/23  Time Spent: 203  pm - 304 pm : 61 Minutes  Treatment Type: Individual Therapy.  Reported Symptoms: Anxiety, Depression, Stress  Mental Status Exam: Appearance:  Casual     Behavior: Appropriate  Motor: Normal  Speech/Language:  Clear and Coherent  Affect: Appropriate  Mood: anxious  Thought process: normal  Thought content:   WNL  Sensory/Perceptual disturbances:   WNL  Orientation: oriented to person, place, time/date, situation, day of week, month of year, and year  Attention: Good  Concentration: Good  Memory: WNL  Fund of knowledge:  Good  Insight:   Fair  Judgment:  Fair  Impulse Control: Fair   Risk Assessment: Danger to Self:  No Self-injurious Behavior: No Danger to Others: No Duty to Warn:no Physical Aggression / Violence:No  Access to Firearms a concern: No  Gang Involvement:No   Subjective:   Brittney Ruiz participated from home, via video, and is aware of risk and limitations, and consented to treatment. Therapist participated from office located at [facility]  We met online due to patient preference.   Patient presented for her session stating she has been stressed about being strung around by [employer]. Patient reports she only requested to see Vonita Moss because her doctor from [facility] recommended her. Patient continued to discuss her frustration and Clinician redirected her to current issues. Patient reports that she still hasn't heard form her children or seen her grandchildren. She states it has been 9 months and she feels they will never come around again. She states she is beginning to come to terms with it and feels the recommended book Clinician shared was helpful in accepting this.  Clinician encouraged patient to be aware of the importance of boundaries and how they are essential in healthy  relationships. Clinician encouraged patient to continue to look over the study she completed to assist her when she is struggling. Clinician also encouraged patient to get outdoors more and get fresh air and move as much as possible to assist in her weight loss journey. Clinician processed with patient that her concern over her weight requires both physical movement, healthy diet and positivity.  Patient was more optimistic than she has been previously. Patient was fully engaged in discussion and is eager to share her concerns during the session. Patient agrees to work on her self care and loving her self and accepting that she deserves respect and setting boundaries. Patient will continue to work on coping skills to assist her with reaching her goals.   Patient will focus on reducing symptoms, improving mood, and establishing a healthy routine. Patient will also set goals to challenge negative thoughts and improve sleep.    Take medication: Medication can help reduce symptoms of depression.  Attend therapy: Therapy can help you identify triggers, correct irrational thinking, and learn healthy coping strategies.  Make lifestyle changes: Lifestyle changes can include exercise, sleep, and nutrition.  Improve mood Spend time with loved ones: Spending time with friends and family can help improve your mood.  Exercise: Exercise can help you feel better about yourself and give you a chance to socialize.  Practice mindfulness: Mindfulness can help you manage your mood.  Challenge negative thoughts  Identify negative thoughts Notice when you have negative thoughts and examine the evidence that supports them. Question whether the negative thoughts are accurate and try to create a more  balanced perspective. Establish a healthy routine Set a sleep schedule: A consistent sleep routine can improve your sleep quality and help you feel better.  Stick to a daily schedule: Try to be out of bed by a certain time  and finish household tasks. Continue with biweekly therapy. Treatment plan will be reviewed by 9/112025. Interventions: Cognitive Behavioral Therapy and Assertiveness/Communication  Diagnosis: Generalized Anxiety Disorder/Major Depressive Disorder, recurrent moderate   Phyllis Ginger MSW, LCSW/DATE 07/22/2023

## 2023-07-28 DIAGNOSIS — M47816 Spondylosis without myelopathy or radiculopathy, lumbar region: Secondary | ICD-10-CM | POA: Diagnosis not present

## 2023-07-28 DIAGNOSIS — M5416 Radiculopathy, lumbar region: Secondary | ICD-10-CM | POA: Diagnosis not present

## 2023-07-28 DIAGNOSIS — M5380 Other specified dorsopathies, site unspecified: Secondary | ICD-10-CM | POA: Diagnosis not present

## 2023-08-02 DIAGNOSIS — M51361 Other intervertebral disc degeneration, lumbar region with lower extremity pain only: Secondary | ICD-10-CM | POA: Diagnosis not present

## 2023-08-02 DIAGNOSIS — R42 Dizziness and giddiness: Secondary | ICD-10-CM | POA: Diagnosis not present

## 2023-08-04 ENCOUNTER — Ambulatory Visit: Payer: Medicare HMO | Admitting: *Deleted

## 2023-08-04 VITALS — Ht 61.5 in | Wt 202.0 lb

## 2023-08-04 DIAGNOSIS — Z0000 Encounter for general adult medical examination without abnormal findings: Secondary | ICD-10-CM

## 2023-08-04 NOTE — Patient Instructions (Signed)
 Ms. Brittney Ruiz , Thank you for taking time to come for your Medicare Wellness Visit. I appreciate your ongoing commitment to your health goals. Please review the following plan we discussed and let me know if I can assist you in the future.   Referrals/Orders/Follow-Ups/Clinician Recommendations: Consider updating your vaccines.  This is a list of the screening recommended for you and due dates:  Health Maintenance  Topic Date Due   Zoster (Shingles) Vaccine (1 of 2) Never done   Pneumococcal Vaccination (2 of 2 - PCV) 05/03/2019   COVID-19 Vaccine (5 - 2024-25 season) 01/17/2023   Medicare Annual Wellness Visit  08/03/2024   Mammogram  05/09/2025   Pap with HPV screening  03/31/2028   Colon Cancer Screening  08/04/2029   DTaP/Tdap/Td vaccine (5 - Td or Tdap) 08/13/2031   Flu Shot  Completed   Hepatitis C Screening  Completed   HIV Screening  Completed   HPV Vaccine  Aged Out   Screening for Lung Cancer  Discontinued    Advanced directives: (Declined) Advance directive discussed with you today. Even though you declined this today, please call our office should you change your mind, and we can give you the proper paperwork for you to fill out.  Next Medicare Annual Wellness Visit scheduled for next year: Yes 08/07/24 @ 3:00

## 2023-08-04 NOTE — Progress Notes (Signed)
 Subjective:   Brittney Ruiz is a 58 y.o. who presents for a Medicare Wellness preventive visit.  Visit Complete: Virtual I connected with  Brittney Ruiz on 08/04/23 by a audio enabled telemedicine application and verified that I am speaking with the correct person using two identifiers.  Patient Location: Home  Provider Location: Office/Clinic  I discussed the limitations of evaluation and management by telemedicine. The patient expressed understanding and agreed to proceed.  Vital Signs: Because this visit was a virtual/telehealth visit, some criteria may be missing or patient reported. Any vitals not documented were not able to be obtained and vitals that have been documented are patient reported.  VideoDeclined- This patient declined Librarian, academic. Therefore the visit was completed with audio only.  Persons Participating in Visit: Patient.  AWV Questionnaire: Yes: Patient Medicare AWV questionnaire was completed by the patient on 08/03/23; I have confirmed that all information answered by patient is correct and no changes since this date.  Cardiac Risk Factors include: dyslipidemia;hypertension;obesity (BMI >30kg/m2)     Objective:    Today's Vitals   08/04/23 1259  Weight: 202 lb (91.6 kg)  Height: 5' 1.5" (1.562 m)   Body mass index is 37.55 kg/m.     08/04/2023    1:27 PM 06/17/2023    6:06 PM 08/05/2022    9:57 AM 07/13/2022    2:32 PM 07/10/2021   11:10 AM 07/10/2020    3:49 PM 07/09/2020   12:09 PM  Advanced Directives  Does Patient Have a Medical Advance Directive? No No No No No No No  Does patient want to make changes to medical advance directive?       No - Patient declined  Would patient like information on creating a medical advance directive? No - Patient declined No - Patient declined  No - Patient declined No - Patient declined      Current Medications (verified) Outpatient Encounter Medications as of 08/04/2023  Medication  Sig   acetaminophen (TYLENOL) 500 MG tablet Take 500 mg by mouth every 6 (six) hours as needed.   albuterol (VENTOLIN HFA) 108 (90 Base) MCG/ACT inhaler USE 2 INHALATIONS ORALLY   EVERY 6 HOURS AS NEEDED.   Alcohol Swabs (B-D SINGLE USE SWABS REGULAR) PADS USE 1 SWAB TOPICALLY TO    CLEAN FINGER BEFORE        CHECKING GLUCOSE   aspirin EC 81 MG tablet Take 81 mg by mouth at bedtime.   B Complex Vitamins (BL VITAMIN B COMPLEX PO) +Folic acid qd.   Blood Glucose Monitoring Suppl (TRUE METRIX METER) w/Device KIT USED TO CHECK GLUCOSE READINGS  ONE TIME DAILY AS NEEDED.   Calcium Carb-Cholecalciferol (CALCIUM 600/VITAMIN D PO) Take by mouth daily.   clorazepate (TRANXENE) 7.5 MG tablet Take 7.5 mg by mouth 3 (three) times daily.   fexofenadine (ALLEGRA) 60 MG tablet Take 1 tablet (60 mg total) by mouth daily. (Patient taking differently: Take 180 mg by mouth daily.)   fluticasone (FLONASE) 50 MCG/ACT nasal spray USE 1 SPRAY IN EACH NOSTRILAT BEDTIME   glucose blood (TRUE METRIX BLOOD GLUCOSE TEST) test strip TEST BLOOD SUGAR ONE TIME DAILY   ibuprofen (ADVIL) 100 MG/5ML suspension Take 200 mg by mouth every 4 (four) hours as needed. Takes 200-300 mg daily as needed   Multiple Vitamin (MULTIVITAMIN) tablet Take 1 tablet by mouth daily.   Omega-3 Fatty Acids (FISH OIL) 1000 MG CAPS Take 2 capsules by mouth daily. Take 2 capsules  daily which is 1200 mg per serving   omeprazole (PRILOSEC) 20 MG capsule TAKE 1 CAPSULE DAILY   ondansetron (ZOFRAN-ODT) 4 MG disintegrating tablet Take 2 mg by mouth every 6 (six) hours as needed.   polyethylene glycol powder (GLYCOLAX/MIRALAX) 17 GM/SCOOP powder Take 1 Container by mouth at bedtime.   propranolol (INDERAL) 20 MG tablet Take 1 tablet (20 mg total) by mouth 3 (three) times daily.   Pseudoephedrine-DM (CHILDRENS DIMETAPP PLUS PO) Take by mouth. As needed   psyllium (METAMUCIL) 58.6 % powder Take 1 packet by mouth daily.   sertraline (ZOLOFT) 100 MG tablet TAKE  1+1/2 TABLETS BY MOUTH DAILY AS DIRECTED (Patient taking differently: Take 112.5 mg by mouth See admin instructions. 112.5 in the morning and 25 mg in the evening)   TRUEplus Lancets 33G MISC TEST BLOOD SUGAR ONE TIME DAILY   metFORMIN (GLUCOPHAGE) 500 MG tablet 1/2 po with lunch daily (Patient not taking: Reported on 08/04/2023)   No facility-administered encounter medications on file as of 08/04/2023.    Allergies (verified) Codeine, Elemental sulfur, Pork allergy, Pork-derived products, Aciphex [rabeprazole sodium], Antivert [meclizine hcl], Buspar [buspirone hcl], Cat dander, Chlorpheniramine-pseudoeph, Chlorpheniramine-pseudoeph, Chocolate flavoring agent (non-screening), Crestor [rosuvastatin calcium], Cymbalta [duloxetine hcl], Duloxetine, Effexor xr [venlafaxine hydrochloride], Epinephrine, Erythromycin, Escitalopram oxalate, Lamictal [lamotrigine], Levofloxacin, Lipitor [atorvastatin], Meclizine, Mometasone furoate, Mometasone furoate, Monosodium glutamate, Neurontin [gabapentin], Other, Prednisone, Red yeast rice extract, Rosuvastatin, Shellfish allergy, Sudafed [pseudoephedrine hcl], Sulfa antibiotics, Sulfasalazine, Valium, Wellbutrin [bupropion hcl], Wixela inhub [fluticasone-salmeterol], Xanax xr [alprazolam], Zetia [ezetimibe], Alfalfa, Brompheniramine-phenylephrine, Buspirone, and Tape   History: Past Medical History:  Diagnosis Date   Abnormal Pap smear of cervix    h/o LEEP   Allergy    Angio-edema    Anxiety    Asthma    Bladder leak    Cancer (HCC)    melanoma 2003   Chest pain    Constipation    COPD (chronic obstructive pulmonary disease) (HCC)    Depression    Diarrhea    Food allergy    Heart valve problem    Heartburn    History of swelling of feet    Hyperlipidemia    Hypertension    IBS (irritable bowel syndrome)    Joint pain    Low back pain    Melanoma (HCC) 2002   Right foot s/p lymph node removed right groin. Breslow's 1.1 mm. Clark's level IV.  Excision/sentinel node biopsy Dr. Tama Gander at [facility].   Obesity (BMI 30-39.9)    Palpitation    Prediabetes    Shortness of breath    Urticaria    Vaginal delivery    X 2   Past Surgical History:  Procedure Laterality Date   BLADDER SUSPENSION  07/2017   COLONOSCOPY WITH PROPOFOL N/A 07/01/2017   Procedure: COLONOSCOPY WITH PROPOFOL;  Surgeon: Pasty Spillers, MD;  Location: [facility] ENDOSCOPY;  Service: Endoscopy;  Laterality: N/A;   COLONOSCOPY WITH PROPOFOL N/A 08/05/2022   Procedure: COLONOSCOPY WITH PROPOFOL;  Surgeon: Wyline Mood, MD;  Location[facility] ENDOSCOPY;  Service: Gastroenterology;  Laterality: N/A;   ESOPHAGOGASTRODUODENOSCOPY (EGD) WITH PROPOFOL N/A 07/01/2017   Procedure: ESOPHAGOGASTRODUODENOSCOPY (EGD) WITH PROPOFOL;  Surgeon: Pasty Spillers, MD;  Location: [facility] ENDOSCOPY;  Service: Endoscopy;  Laterality: N/A;   LEEP  1995   LEEP     PARTIAL HYSTERECTOMY  08/2017   s/p partial hysterectomy - per patient has fallopian tubes; ovaries INTACT. NO CERVIX on exam 03/06/20   SKIN CANCER EXCISION     melanoma  TONSILLECTOMY     Family History  Problem Relation Age of Onset   Hyperlipidemia Mother    Hypertension Mother    Diabetes Mother    Breast cancer Mother 44   Stroke Mother    Cancer Father        lung   Depression Father    Diabetes Father    Hypertension Father    Hyperlipidemia Father    Sleep apnea Father    Alcoholism Father    Hyperlipidemia Sister    Hypertension Sister    Migraines Sister    Hypertension Brother    Hyperlipidemia Brother    Migraines Brother    Cancer Maternal Grandmother        glioblastoma   Depression Maternal Grandfather    Heart disease Maternal Grandfather    Cancer Paternal Grandfather        unknown   AAA (abdominal aortic aneurysm) Paternal Grandfather    Social History   Socioeconomic History   Marital status: Married    Spouse name: Not on file   Number of children: Not on file   Years of education:  Not on file   Highest education level: Associate degree: academic program  Occupational History   Occupation: disability  Tobacco Use   Smoking status: Former    Current packs/day: 0.00    Average packs/day: 1 pack/day for 25.0 years (25.0 ttl pk-yrs)    Types: Cigarettes    Start date: 24    Quit date: 2008    Years since quitting: 17.2   Smokeless tobacco: Never  Vaping Use   Vaping status: Never Used  Substance and Sexual Activity   Alcohol use: No   Drug use: No   Sexual activity: Yes  Other Topics Concern   Not on file  Social History Narrative   Married 25 years.        2 children by 1st husband.       Disabled. Not working.    Right handed      One story home      Social Drivers of Health   Financial Resource Strain: Medium Risk (08/03/2023)   Overall Financial Resource Strain (CARDIA)    Difficulty of Paying Living Expenses: Somewhat hard  Food Insecurity: Food Insecurity Present (08/03/2023)   Hunger Vital Sign    Worried About Running Out of Food in the Last Year: Sometimes true    Ran Out of Food in the Last Year: Sometimes true  Transportation Needs: No Transportation Needs (08/03/2023)   PRAPARE - Administrator, Civil Service (Medical): No    Lack of Transportation (Non-Medical): No  Physical Activity: Inactive (08/03/2023)   Exercise Vital Sign    Days of Exercise per Week: 0 days    Minutes of Exercise per Session: 0 min  Stress: Stress Concern Present (08/03/2023)   [employer] of Occupational Health - Occupational Stress Questionnaire    Feeling of Stress : To some extent  Social Connections: Socially Integrated (08/03/2023)   Social Connection and Isolation Panel [NHANES]    Frequency of Communication with Friends and Family: More than three times a week    Frequency of Social Gatherings with Friends and Family: Once a week    Attends Religious Services: 1 to 4 times per year    Active Member of [employer] or Organizations: Yes     Attends Banker Meetings: 1 to 4 times per year    Marital Status: Married  Tobacco Counseling Counseling given: Not Answered    Clinical Intake:  Pre-visit preparation completed: Yes  Pain : No/denies pain     BMI - recorded: 37.55 Nutritional Status: BMI > 30  Obese Nutritional Risks: None Diabetes: No  Lab Results  Component Value Date   HGBA1C 5.6 02/04/2023   HGBA1C 5.7 (H) 07/30/2022   HGBA1C 5.5 02/02/2022     How often do you need to have someone help you when you read instructions, pamphlets, or other written materials from your doctor or pharmacy?: 1 - Never  Interpreter Needed?: No  Information entered by :: R. Gracelynne Benedict LPN   Activities of Daily Living     08/03/2023    6:49 PM  In your present state of health, do you have any difficulty performing the following activities:  Hearing? 0  Vision? 0  Comment glasses not happy with bifocals  Difficulty concentrating or making decisions? 1  Walking or climbing stairs? 1  Dressing or bathing? 0  Doing errands, shopping? 1  Preparing Food and eating ? N  Using the Toilet? N  In the past six months, have you accidently leaked urine? Y  Do you have problems with loss of bowel control? N  Managing your Medications? N  Managing your Finances? N  Housekeeping or managing your Housekeeping? Y    Patient Care Team: Allegra Grana, FNP as PCP - General (Family Medicine) Glendale Chard, DO as Consulting Physician (Neurology) Rennis Golden Lisette Abu, MD as Consulting Physician (Cardiology)  Indicate any recent Medical Services you may have received from other than [facility] providers in the past year (date may be approximate).     Assessment:   This is a routine wellness examination for Beena.  Hearing/Vision screen Hearing Screening - Comments:: No issues Vision Screening - Comments:: glasses   Goals Addressed             This Visit's Progress    Patient Stated       Wants to lose some  weight       Depression Screen     08/04/2023    1:17 PM 04/01/2023   11:53 AM 03/11/2023    3:00 PM 07/13/2022    2:23 PM 06/01/2022    3:55 PM 03/27/2022   11:57 AM 03/04/2022    1:38 PM  PHQ 2/9 Scores  PHQ - 2 Score 4 0 5 2 2 4 1   PHQ- 9 Score 13 0 15 5 5 8 1   Exception Documentation    --       Fall Risk     08/03/2023    6:49 PM 04/01/2023   11:52 AM 03/11/2023    3:00 PM 07/12/2022    6:05 PM 06/01/2022    3:55 PM  Fall Risk   Falls in the past year? 0 0 0 0 0  Number falls in past yr: 0 0 0 0 0  Injury with Fall? 0 0 0 0 0  Risk for fall due to : No Fall Risks No Fall Risks No Fall Risks    Follow up Falls prevention discussed;Falls evaluation completed Falls evaluation completed Falls evaluation completed  Falls evaluation completed    MEDICARE RISK AT HOME:  Medicare Risk at Home Any stairs in or around the home?: (Patient-Rptd) Yes If so, are there any without handrails?: (Patient-Rptd) No Home free of loose throw rugs in walkways, pet beds, electrical cords, etc?: (Patient-Rptd) Yes Adequate lighting in your home to reduce risk of falls?: (  Patient-Rptd) Yes Life alert?: (Patient-Rptd) No Use of a cane, walker or w/c?: (Patient-Rptd) No Grab bars in the bathroom?: (Patient-Rptd) No Shower chair or bench in shower?: (Patient-Rptd) No Elevated toilet seat or a handicapped toilet?: (Patient-Rptd) No  TIMED UP AND GO:  Was the test performed?  No  Cognitive Function: 6CIT completed    06/25/2017    1:38 PM  MMSE - Mini Mental State Exam  Orientation to time 5  Orientation to Place 5  Registration 3  Attention/ Calculation 5  Recall 3  Language- name 2 objects 2  Language- repeat 1  Language- follow 3 step command 3  Language- read & follow direction 1  Write a sentence 1  Copy design 1  Total score 30        08/04/2023    1:28 PM 07/13/2022    2:29 PM 07/09/2020   12:10 PM 07/07/2019   11:27 AM 06/28/2018   11:21 AM  6CIT Screen  What Year? 0  points 0 points 0 points 0 points 0 points  What month? 0 points 0 points 0 points 0 points 0 points  What time? 0 points 0 points 0 points 0 points 0 points  Count back from 20 0 points 0 points  0 points 0 points  Months in reverse 0 points 0 points 0 points 0 points 0 points  Repeat phrase 0 points 0 points  0 points 0 points  Total Score 0 points 0 points  0 points 0 points    Immunizations Immunization History  Administered Date(s) Administered   Hep A / Hep B 07/21/2021, 09/01/2021   Influenza Inj Mdck Quad Pf 03/05/2017, 03/22/2019   Influenza Split 04/17/2013   Influenza Whole 05/18/2008, 05/29/2011   Influenza, Seasonal, Injecte, Preservative Fre 04/01/2023   Influenza,inj,Quad PF,6+ Mos 02/21/2016, 03/03/2021, 03/04/2022   Influenza-Unspecified 02/16/2016, 03/05/2017, 05/06/2020   PFIZER(Purple Top)SARS-COV-2 Vaccination 08/14/2019, 09/06/2019, 03/27/2020   Pfizer Covid-19 Vaccine Bivalent Booster 20yrs & up 05/27/2021   Pneumococcal Polysaccharide-23 05/02/2018   Td 05/18/1997   Tdap 07/16/2009, 04/17/2013, 08/12/2021    Screening Tests Health Maintenance  Topic Date Due   Zoster Vaccines- Shingrix (1 of 2) Never done   Pneumococcal Vaccine 78-70 Years old (2 of 2 - PCV) 05/03/2019   COVID-19 Vaccine (5 - 2024-25 season) 01/17/2023   Medicare Annual Wellness (AWV)  08/03/2024   MAMMOGRAM  05/09/2025   Cervical Cancer Screening (HPV/Pap Cotest)  03/31/2028   Colonoscopy  08/04/2029   DTaP/Tdap/Td (5 - Td or Tdap) 08/13/2031   INFLUENZA VACCINE  Completed   Hepatitis C Screening  Completed   HIV Screening  Completed   HPV VACCINES  Aged Out   Lung Cancer Screening  Discontinued    Health Maintenance  Health Maintenance Due  Topic Date Due   Zoster Vaccines- Shingrix (1 of 2) Never done   Pneumococcal Vaccine 62-47 Years old (2 of 2 - PCV) 05/03/2019   COVID-19 Vaccine (5 - 2024-25 season) 01/17/2023   Health Maintenance Items Addressed: Discussed the need  to update vaccines pneumonia, covid and shingles.,   Additional Screening:  Vision Screening: Recommended annual ophthalmology exams for early detection of glaucoma and other disorders of the eye. Up to date [facility]  Dental Screening: Recommended annual dental exams for proper oral hygiene  Community Resource Referral / Chronic Care Management: CRR required this visit?  No   CCM required this visit?  No     Plan:     I have personally reviewed  and noted the following in the patient's chart:   Medical and social history Use of alcohol, tobacco or illicit drugs  Current medications and supplements including opioid prescriptions. Patient is not currently taking opioid prescriptions. Functional ability and status Nutritional status Physical activity Advanced directives List of other physicians Hospitalizations, surgeries, and ER visits in previous 12 months Vitals Screenings to include cognitive, depression, and falls Referrals and appointments  In addition, I have reviewed and discussed with patient certain preventive protocols, quality metrics, and best practice recommendations. A written personalized care plan for preventive services as well as general preventive health recommendations were provided to patient.     Sydell Axon, LPN   1/61/0960   After Visit Summary: (MyChart) Due to this being a telephonic visit, the after visit summary with patients personalized plan was offered to patient via MyChart   Notes: Please refer to Routing Comments.

## 2023-08-05 ENCOUNTER — Ambulatory Visit: Payer: Self-pay

## 2023-08-05 ENCOUNTER — Telehealth: Payer: Self-pay | Admitting: Family

## 2023-08-05 ENCOUNTER — Ambulatory Visit: Admitting: Licensed Clinical Social Worker

## 2023-08-05 NOTE — Telephone Encounter (Signed)
 Spoke to pt and she thinks it is because of the Propanolol that she has a lower heart rate. She stated that she will discuss it at her next appt in May declined to schedule an appt now

## 2023-08-05 NOTE — Telephone Encounter (Signed)
 noted

## 2023-08-05 NOTE — Telephone Encounter (Signed)
 Please  see note in chart from 08/05/23 I have spoken to pt she declines appt at this time will discuss at next appt!

## 2023-08-05 NOTE — Telephone Encounter (Signed)
 Notes from AMW visit  . Patient is concerned about her low heart rate that she checks with her fit bit. Patient stated that her heart rate has been running 58-61 and now it is 64. Patient stated that it has gotten as low as 52.  Patient stated that this is while she is resting and it does get higher when she is cleaning or active.     Call pt Sch appt to discuss concerns

## 2023-08-05 NOTE — Telephone Encounter (Addendum)
 NT has made 3 attempts to call the patient without success. Left voicemail to call back. Routing to clinic.   Patient has already spoken with clinic and declined an appointment for her symptoms.    Copied from CRM [id number]. Topic: Clinical - Red Word Triage >> Aug 05, 2023  9:30 AM Mackie Pai E wrote: [facility] that prompted transfer to Nurse Triage: Low heart rate. Patient has been concerned about her heart rate dropping into the 50's which is abnormal for her. Patient stated it could get as low as 52. Patient experiencing tiredness and low energy related to this issue.

## 2023-08-06 ENCOUNTER — Telehealth: Payer: Self-pay

## 2023-08-06 NOTE — Telephone Encounter (Signed)
 Called and LVM. Request call back and confirm f/u CT and appt with Groce NP is not needed. Last call patient was to f/u outside of [facility] and patient did not plan to follow back up in our office or complete CT scan in April.

## 2023-08-07 DIAGNOSIS — R42 Dizziness and giddiness: Secondary | ICD-10-CM

## 2023-08-08 ENCOUNTER — Encounter: Payer: Self-pay | Admitting: Family

## 2023-08-10 DIAGNOSIS — R531 Weakness: Secondary | ICD-10-CM | POA: Diagnosis not present

## 2023-08-10 DIAGNOSIS — M51361 Other intervertebral disc degeneration, lumbar region with lower extremity pain only: Secondary | ICD-10-CM | POA: Diagnosis not present

## 2023-08-10 DIAGNOSIS — M542 Cervicalgia: Secondary | ICD-10-CM | POA: Diagnosis not present

## 2023-08-12 ENCOUNTER — Ambulatory Visit: Admitting: Family

## 2023-08-16 DIAGNOSIS — M5380 Other specified dorsopathies, site unspecified: Secondary | ICD-10-CM | POA: Diagnosis not present

## 2023-08-16 DIAGNOSIS — M47816 Spondylosis without myelopathy or radiculopathy, lumbar region: Secondary | ICD-10-CM | POA: Diagnosis not present

## 2023-08-16 DIAGNOSIS — M5416 Radiculopathy, lumbar region: Secondary | ICD-10-CM | POA: Diagnosis not present

## 2023-08-17 DIAGNOSIS — M51361 Other intervertebral disc degeneration, lumbar region with lower extremity pain only: Secondary | ICD-10-CM | POA: Diagnosis not present

## 2023-08-20 ENCOUNTER — Telehealth: Payer: Self-pay

## 2023-08-20 ENCOUNTER — Ambulatory Visit (INDEPENDENT_AMBULATORY_CARE_PROVIDER_SITE_OTHER): Admitting: Licensed Clinical Social Worker

## 2023-08-20 DIAGNOSIS — F331 Major depressive disorder, recurrent, moderate: Secondary | ICD-10-CM | POA: Diagnosis not present

## 2023-08-20 DIAGNOSIS — F411 Generalized anxiety disorder: Secondary | ICD-10-CM | POA: Diagnosis not present

## 2023-08-20 NOTE — Progress Notes (Signed)
 [facility] Counselor/Therapist Progress Note  Patient ID: Brittney Ruiz, MRN: [id number]    Date: 08/20/23  Time Spent: 1130  am - 1231 pm : 61 Minutes  Treatment Type: Individual Therapy.  Reported Symptoms: Anxiety, Depression, Stress   Mental Status Exam: Appearance:  Casual     Behavior: Appropriate  Motor: Normal  Speech/Language:  Clear and Coherent  Affect: Appropriate  Mood: anxious  Thought process: normal  Thought content:   WNL  Sensory/Perceptual disturbances:   WNL  Orientation: oriented to person, place, time/date, situation, day of week, month of year, and year  Attention: Good  Concentration: Good  Memory: WNL  Fund of knowledge:  Good  Insight:   Fair  Judgment:  Fair  Impulse Control: Fair    Risk Assessment: Danger to Self:  No Self-injurious Behavior: No Danger to Others: No Duty to Warn:no Physical Aggression / Violence:No  Access to Firearms a concern: No  Gang Involvement:No    Subjective:    Brittney Ruiz participated from home, via video, and is aware of risk and limitations, and consented to treatment. Therapist participated from office located at [facility].  We met online due to patient preference.    Brittney Ruiz presented for her session from her home via video. Brittney Ruiz shared that not much has changed but that she still struggles with her lack of a relationship with her children and grandchildren. Brittney Ruiz stated she received an invitation to her son's child's birthday party. Brittney Ruiz identified her anxiety about attending the event and concerned about if her daughter will show up at the party. Patient states that she feels angry and hurt and various emotions at what has taken place over the past 2 years. Patient is concerned about how to approach her daughter if she comes to the party as well as her grandchildren.  Clinician provided support and validation to patient through active listening and verbal interaction. Clinician encouraged  patient to utilize her coping skills to re frame her negative thoughts about the event and to establish clear limits: Set boundaries to protect your own needs and create a safe environment.  Respect Personal Space: Acknowledge and respect each other's personal space and limits.  Self-Care: Prioritize your own well-being  Clinician advised that by adhering to these ideas it can prevent additional conflict and/or making anyone feel uncomfortable. Clinician also processed with patient breathing techniques for anxiety and the importance of not placing herself in a situation where she is uncomfortable.  Patient agreed to continue to work on her coping skills  and practice re framing there thoughts as to not allow herself to be so negative and anxious. Brittney Ruiz has an increased level of anxiety as the party approaches and acknowledges the need to take care of her own well being and set boundaries as to how she will allow herself to be treated. Brittney Ruiz was motivated and fully engaged in discussion. Brittney Ruiz will continue with bi weekly therapy and treatment plan will be reviewed by 01/27/2024.  Purpose of Reframing: Reframing helps individuals move beyond fixed or negative viewpoints by encouraging them to explore alternative interpretations and meanings of events or situations.  Therapeutic Applications: In therapy, reframing is used to help clients challenge negative thoughts and beliefs, develop more adaptive coping mechanisms, and reduce emotional distress.  Cognitive Reframing: This specific type of reframing focuses on identifying and changing negative thought patterns and emotional reactions.  Steps: A common approach involves identifying the initial negative thought or feeling, exploring alternative perspectives,  and replacing the negative thought with a more positive or balanced one.  Benefits: Reframing can lead to increased resilience, improved emotional regulation, and a greater sense of control over one's  responses to challenging situations.  Interventions: Cognitive Behavioral Therapy and Assertiveness/Communication  Diagnosis: Generalized Anxiety Disorder/Major Depressive Disorder, recurrent, moderate.     Keenan Pastor MSW, LCSW/DATE 08/20/2023

## 2023-08-20 NOTE — Telephone Encounter (Signed)
 Additional attempt made to confirm patient was seeking care outside of our office. No response from previous two outreaches.

## 2023-08-23 DIAGNOSIS — M79645 Pain in left finger(s): Secondary | ICD-10-CM | POA: Diagnosis not present

## 2023-08-23 DIAGNOSIS — R200 Anesthesia of skin: Secondary | ICD-10-CM | POA: Diagnosis not present

## 2023-08-23 DIAGNOSIS — R202 Paresthesia of skin: Secondary | ICD-10-CM | POA: Diagnosis not present

## 2023-08-23 DIAGNOSIS — M1812 Unilateral primary osteoarthritis of first carpometacarpal joint, left hand: Secondary | ICD-10-CM | POA: Diagnosis not present

## 2023-08-26 DIAGNOSIS — M51361 Other intervertebral disc degeneration, lumbar region with lower extremity pain only: Secondary | ICD-10-CM | POA: Diagnosis not present

## 2023-08-26 DIAGNOSIS — R531 Weakness: Secondary | ICD-10-CM | POA: Diagnosis not present

## 2023-08-26 DIAGNOSIS — M542 Cervicalgia: Secondary | ICD-10-CM | POA: Diagnosis not present

## 2023-08-27 DIAGNOSIS — M542 Cervicalgia: Secondary | ICD-10-CM | POA: Diagnosis not present

## 2023-08-27 DIAGNOSIS — Q248 Other specified congenital malformations of heart: Secondary | ICD-10-CM | POA: Diagnosis not present

## 2023-08-27 DIAGNOSIS — R519 Headache, unspecified: Secondary | ICD-10-CM | POA: Diagnosis not present

## 2023-09-01 DIAGNOSIS — R531 Weakness: Secondary | ICD-10-CM | POA: Diagnosis not present

## 2023-09-01 DIAGNOSIS — M542 Cervicalgia: Secondary | ICD-10-CM | POA: Diagnosis not present

## 2023-09-01 DIAGNOSIS — M51361 Other intervertebral disc degeneration, lumbar region with lower extremity pain only: Secondary | ICD-10-CM | POA: Diagnosis not present

## 2023-09-02 ENCOUNTER — Ambulatory Visit: Admitting: Licensed Clinical Social Worker

## 2023-09-03 DIAGNOSIS — Q248 Other specified congenital malformations of heart: Secondary | ICD-10-CM | POA: Diagnosis not present

## 2023-09-03 DIAGNOSIS — I253 Aneurysm of heart: Secondary | ICD-10-CM | POA: Diagnosis not present

## 2023-09-03 DIAGNOSIS — I081 Rheumatic disorders of both mitral and tricuspid valves: Secondary | ICD-10-CM | POA: Diagnosis not present

## 2023-09-03 DIAGNOSIS — Q2112 Patent foramen ovale: Secondary | ICD-10-CM | POA: Diagnosis not present

## 2023-09-06 DIAGNOSIS — M51361 Other intervertebral disc degeneration, lumbar region with lower extremity pain only: Secondary | ICD-10-CM | POA: Diagnosis not present

## 2023-09-06 DIAGNOSIS — M542 Cervicalgia: Secondary | ICD-10-CM | POA: Diagnosis not present

## 2023-09-06 DIAGNOSIS — R531 Weakness: Secondary | ICD-10-CM | POA: Diagnosis not present

## 2023-09-13 DIAGNOSIS — Z87891 Personal history of nicotine dependence: Secondary | ICD-10-CM | POA: Diagnosis not present

## 2023-09-13 DIAGNOSIS — Z6837 Body mass index (BMI) 37.0-37.9, adult: Secondary | ICD-10-CM | POA: Diagnosis not present

## 2023-09-13 DIAGNOSIS — E669 Obesity, unspecified: Secondary | ICD-10-CM | POA: Diagnosis not present

## 2023-09-13 DIAGNOSIS — E7849 Other hyperlipidemia: Secondary | ICD-10-CM | POA: Diagnosis not present

## 2023-09-13 DIAGNOSIS — Z131 Encounter for screening for diabetes mellitus: Secondary | ICD-10-CM | POA: Diagnosis not present

## 2023-09-16 DIAGNOSIS — M51361 Other intervertebral disc degeneration, lumbar region with lower extremity pain only: Secondary | ICD-10-CM | POA: Diagnosis not present

## 2023-09-20 DIAGNOSIS — R260 Ataxic gait: Secondary | ICD-10-CM | POA: Diagnosis not present

## 2023-09-20 DIAGNOSIS — R42 Dizziness and giddiness: Secondary | ICD-10-CM | POA: Diagnosis not present

## 2023-09-21 DIAGNOSIS — R918 Other nonspecific abnormal finding of lung field: Secondary | ICD-10-CM | POA: Diagnosis not present

## 2023-09-22 DIAGNOSIS — R911 Solitary pulmonary nodule: Secondary | ICD-10-CM | POA: Diagnosis not present

## 2023-09-27 DIAGNOSIS — F321 Major depressive disorder, single episode, moderate: Secondary | ICD-10-CM | POA: Diagnosis not present

## 2023-09-29 ENCOUNTER — Encounter: Payer: Self-pay | Admitting: Family

## 2023-09-29 ENCOUNTER — Ambulatory Visit: Payer: Medicare HMO | Admitting: Family

## 2023-09-29 VITALS — BP 124/84 | HR 76 | Temp 97.7°F | Ht 62.0 in | Wt 204.8 lb

## 2023-09-29 DIAGNOSIS — M542 Cervicalgia: Secondary | ICD-10-CM | POA: Diagnosis not present

## 2023-09-29 DIAGNOSIS — M51361 Other intervertebral disc degeneration, lumbar region with lower extremity pain only: Secondary | ICD-10-CM | POA: Diagnosis not present

## 2023-09-29 DIAGNOSIS — R531 Weakness: Secondary | ICD-10-CM | POA: Diagnosis not present

## 2023-09-29 DIAGNOSIS — Q2112 Patent foramen ovale: Secondary | ICD-10-CM | POA: Diagnosis not present

## 2023-09-29 DIAGNOSIS — R911 Solitary pulmonary nodule: Secondary | ICD-10-CM | POA: Diagnosis not present

## 2023-09-29 DIAGNOSIS — F419 Anxiety disorder, unspecified: Secondary | ICD-10-CM | POA: Diagnosis not present

## 2023-09-29 DIAGNOSIS — R7303 Prediabetes: Secondary | ICD-10-CM

## 2023-09-29 NOTE — Progress Notes (Unsigned)
 Assessment & Plan:  There are no diagnoses linked to this encounter.   Return precautions given.   Risks, benefits, and alternatives of the medications and treatment plan prescribed today were discussed, and patient expressed understanding.   Education regarding symptom management and diagnosis given to patient on AVS either electronically or printed.  No follow-ups on file.  Bascom Bossier, FNP  Subjective:    Patient ID: Brittney Ruiz, female    DOB: 10/31/1965, 58 y.o.   MRN: [id number]  CC: Brittney Ruiz is a 58 y.o. female who presents today for follow up.   HPI: She is tearful over over recent health diagnosis and battery of tests.  She is following with counselor   She is consider mounjaro and zepbound .     Scheduled to see cardiology for positive bubble study 10/12/23  Following with pulmonology - planning CT Chest 03/2024  Following endocrinology Consider trial of bempedoic acid. Ordered CT calcium  score   She is no longer on leqvio  d/t transaminitis and triglyceridemia CTA no acute intracranial abnormality  Ct calcium  score 09/21/23 Total calcium  score of 0.6 is between the 50th and 75th percentile for  women of age 18, MESA classification.  2. Indeterminant etiology small less than 4 mm sized noncalcified pulmonary  nodule, if the patient is high risk for lung cancer consider one-year  follow-up chest CT to document stability   Echo TEE echocardiogram with patent foramen ovale , mild mitral and tricuspid regurgitation    Allergies: Codeine, Elemental sulfur, Pork allergy, Pork-derived products, Aciphex [rabeprazole sodium], Antivert [meclizine hcl], Buspar [buspirone hcl], Cat dander, Chlorpheniramine-pseudoeph, Chlorpheniramine-pseudoeph, Chocolate flavoring agent (non-screening), Crestor [rosuvastatin calcium ], Cymbalta [duloxetine hcl], Duloxetine, Effexor xr [venlafaxine hydrochloride], Epinephrine , Erythromycin, Escitalopram oxalate, Lamictal  [lamotrigine], Levofloxacin , Lipitor [atorvastatin ], Meclizine, Mometasone furoate, Mometasone furoate, Monosodium glutamate, Neurontin [gabapentin], Other, Prednisone, Red yeast rice extract, Rosuvastatin, Shellfish allergy, Sudafed [pseudoephedrine hcl], Sulfa antibiotics, Sulfasalazine, Valium , Wellbutrin [bupropion hcl], Wixela inhub [fluticasone -salmeterol], Xanax  xr [alprazolam ], Zetia  [ezetimibe ], Alfalfa, Brompheniramine-phenylephrine, Buspirone, and Tape Current Outpatient Medications on File Prior to Visit  Medication Sig Dispense Refill   acetaminophen  (TYLENOL ) 500 MG tablet Take 500 mg by mouth every 6 (six) hours as needed.     albuterol  (VENTOLIN  HFA) 108 (90 Base) MCG/ACT inhaler USE 2 INHALATIONS ORALLY   EVERY 6 HOURS AS NEEDED. 18 g 1   Alcohol  Swabs (B-D SINGLE USE SWABS REGULAR) PADS USE 1 SWAB TOPICALLY TO    CLEAN FINGER BEFORE        CHECKING GLUCOSE 300 each 1   aspirin EC 81 MG tablet Take 81 mg by mouth at bedtime.     B Complex Vitamins (BL VITAMIN B COMPLEX PO) +Folic acid  qd.     Blood Glucose Monitoring Suppl (TRUE METRIX METER) w/Device KIT USED TO CHECK GLUCOSE READINGS  ONE TIME DAILY AS NEEDED. 1 kit 0   Calcium  Carb-Cholecalciferol (CALCIUM  600/VITAMIN D  PO) Take by mouth daily.     clorazepate (TRANXENE) 7.5 MG tablet Take 7.5 mg by mouth 3 (three) times daily.     fexofenadine  (ALLEGRA ) 60 MG tablet Take 1 tablet (60 mg total) by mouth daily. (Patient taking differently: Take 180 mg by mouth daily.) 90 tablet 1   fluticasone  (FLONASE ) 50 MCG/ACT nasal spray USE 1 SPRAY IN EACH NOSTRILAT BEDTIME 16 g 3   glucose blood (TRUE METRIX BLOOD GLUCOSE TEST) test strip TEST BLOOD SUGAR ONE TIME DAILY 100 strip 0   ibuprofen  (ADVIL ) 100 MG/5ML suspension Take 200 mg by  mouth every 4 (four) hours as needed. Takes 200-300 mg daily as needed     metFORMIN  (GLUCOPHAGE ) 500 MG tablet 1/2 po with lunch daily (Patient not taking: Reported on 08/04/2023) 30 tablet 0   Multiple  Vitamin (MULTIVITAMIN) tablet Take 1 tablet by mouth daily.     Omega-3 Fatty Acids (FISH OIL) 1000 MG CAPS Take 2 capsules by mouth daily. Take 2 capsules daily which is 1200 mg per serving     omeprazole  (PRILOSEC) 20 MG capsule TAKE 1 CAPSULE DAILY 90 capsule 2   ondansetron  (ZOFRAN -ODT) 4 MG disintegrating tablet Take 2 mg by mouth every 6 (six) hours as needed.     polyethylene glycol powder (GLYCOLAX /MIRALAX ) 17 GM/SCOOP powder Take 1 Container by mouth at bedtime.     propranolol  (INDERAL ) 20 MG tablet Take 1 tablet (20 mg total) by mouth 3 (three) times daily. 90 tablet 3   Pseudoephedrine-DM (CHILDRENS DIMETAPP PLUS PO) Take by mouth. As needed     psyllium (METAMUCIL) 58.6 % powder Take 1 packet by mouth daily.     sertraline  (ZOLOFT ) 100 MG tablet TAKE 1+1/2 TABLETS BY MOUTH DAILY AS DIRECTED (Patient taking differently: Take 112.5 mg by mouth See admin instructions. 112.5 in the morning and 25 mg in the evening) 45 tablet 3   TRUEplus Lancets 33G MISC TEST BLOOD SUGAR ONE TIME DAILY 100 each 0   No current facility-administered medications on file prior to visit.    Review of Systems    Objective:    LMP  (LMP Unknown)  BP Readings from Last 3 Encounters:  06/18/23 123/71  06/01/23 121/78  05/04/23 124/78   Wt Readings from Last 3 Encounters:  08/04/23 202 lb (91.6 kg)  06/28/23 203 lb (92.1 kg)  06/17/23 209 lb (94.8 kg)    Physical Exam

## 2023-09-29 NOTE — Patient Instructions (Addendum)
 I would favor Zepbound or wegovy  Please keep cardiology appointment.   I am thinking of you.   If you would like to consider Zepbound, please read below   Dose increments are below.   7.5 mg/0.5 mL (0.5 mL) 10 mg/0.5 mL (0.5 mL) 12.5 mg/0.5 mL (0.5 mL) 15 mg/0.5 mL (0.5 mL)   Please read information on medication below and remember black box warning that you may not take if you or a family member is diagnosed with thyroid  cancer (medullary thyroid  cancer), or multiple endocrine neoplasia.       Brand Names: [place]  Mounjaro; Zepbound Brand Names: [place] Mounjaro  Warning  This drug has been shown to cause thyroid  cancer in some animals. It is not known if this happens in humans. If thyroid  cancer happens, it may be deadly if not found and treated early. Call your doctor right away if you have a neck mass, trouble breathing, trouble swallowing, or have hoarseness that will not go away.  Do not use this drug if you have a health problem called Multiple Endocrine Neoplasia syndrome type 2 (MEN 2), or if you or a family member have had thyroid  cancer.  Have your blood work checked and thyroid  ultrasounds as you have been told by your doctor. What is this drug used for?  It is used to lower blood sugar in people with type 2 diabetes.  It is used to help with weight loss in certain people. What do I need to tell my doctor BEFORE I take this drug? All products:  If you are allergic to this drug; any part of this drug; or any other drugs, foods, or substances. Tell your doctor about the allergy and what signs you had.  If you have ever had pancreatitis.  If you have stomach or bowel problems.  If you are using another drug that has the same drug in it.  If you are using another drug like this one. If you are not sure, ask your doctor or pharmacist. If you are using this drug for diabetes:  If you have type 1 diabetes. Do not use this drug to treat type 1 diabetes. Zepbound:  If you have  or have ever had depression or thoughts of suicide. This is not a list of all drugs or health problems that interact with this drug. Tell your doctor and pharmacist about all of your drugs (prescription or OTC, natural products, vitamins) and health problems. You must check to make sure that it is safe for you to take this drug with all of your drugs and health problems. Do not start, stop, or change the dose of any drug without checking with your doctor. What are some things I need to know or do while I take this drug? All products:  Tell all of your health care providers that you take this drug. This includes your doctors, nurses, pharmacists, and dentists.  Follow the diet and workout plan that your doctor told you about.  Talk with your doctor before you drink alcohol .  Birth control pills may not work as well to prevent pregnancy. If you take birth control pills, you may need to switch to another type of hormone-based birth control like a vaginal ring if your doctor tells you to. If another type of hormone-based birth control is not an option, use some other kind of birth control also, like a condom. Do this for 4 weeks after starting this drug and for 4 weeks each time the dose  is raised.  This drug may prevent other drugs taken by mouth from getting into the body. If you take other drugs by mouth, you may need to take them at some other time than this drug. Talk with your doctor.  Do not share with another person even if the needle has been changed. Sharing your tray or pen may pass infections from one person to another. This includes infections you may not know you have.  If you cannot drink liquids by mouth or if you have upset stomach, throwing up, or diarrhea that does not go away; you need to avoid getting dehydrated. Contact your doctor to find out what to do. Dehydration may lead to low blood pressure or to new or worse kidney problems.  A severe and sometimes deadly pancreas problem  (pancreatitis) has happened with other drugs like this one. If you are using this drug for diabetes:  It may be harder to control blood sugar during times of stress such as fever, infection, injury, or surgery. A change in physical activity, exercise, or diet may also affect blood sugar.  Check your blood sugar as you have been told by your doctor.  Do not drive if your blood sugar has been low. There is a greater chance of you having a crash.  Wear disease medical alert ID (identification).  Tell your doctor if you are pregnant, plan on getting pregnant, or are breast-feeding. You will need to talk about the benefits and risks to you and the baby. Zepbound:  If you have high blood sugar (diabetes), you will need to watch your blood sugar closely.  Weight loss during pregnancy may cause harm to the unborn baby. If you get pregnant while taking this drug or if you want to get pregnant, call your doctor right away.  Tell your doctor if you are breast-feeding. You will need to talk about any risks to your baby. What are some side effects that I need to call my doctor about right away? WARNING/CAUTION: Even though it may be rare, some people may have very bad and sometimes deadly side effects when taking a drug. Tell your doctor or get medical help right away if you have any of the following signs or symptoms that may be related to a very bad side effect: All products:  Signs of an allergic reaction, like rash; hives; itching; red, swollen, blistered, or peeling skin with or without fever; wheezing; tightness in the chest or throat; trouble breathing, swallowing, or talking; unusual hoarseness; or swelling of the mouth, face, lips, tongue, or throat.  Signs of kidney problems like unable to pass urine, change in how much urine is passed, blood in the urine, or a big weight gain.  Signs of gallbladder problems like pain in the upper right belly area, right shoulder area, or between the shoulder blades;  yellow skin or eyes; fever with chills; bloating; or very upset stomach or throwing up.  Signs of a pancreas problem (pancreatitis) like very bad stomach pain, very bad back pain, or very bad upset stomach or throwing up.  Dizziness or passing out.  A fast heartbeat.  Change in eyesight.  Low blood sugar can happen. The chance may be raised when this drug is used with other drugs for diabetes. Signs may be dizziness, headache, feeling sleepy or weak, shaking, fast heartbeat, confusion, hunger, or sweating. Call your doctor right away if you have any of these signs. Follow what you have been told to do for low blood  sugar. This may include taking glucose tablets, liquid glucose, or some fruit juices. Zepbound:  New or worse behavior or mood changes like depression or thoughts of suicide. What are some other side effects of this drug? All drugs may cause side effects. However, many people have no side effects or only have minor side effects. Call your doctor or get medical help if any of these side effects or any other side effects bother you or do not go away: All products:  Constipation, diarrhea, stomach pain, upset stomach, throwing up, or decreased appetite.  Heartburn.  Pain, itching, or other irritation where the injection was given. Zepbound:  Feeling tired or weak. These are not all of the side effects that may occur. If you have questions about side effects, call your doctor. Call your doctor for medical advice about side effects. You may report side effects to your national health agency. How is this drug best taken? Use this drug as ordered by your doctor. Read all information given to you. Follow all instructions closely. All products:  It is given as a shot into the fatty part of the skin on the top of the thigh, belly area, or upper arm.  If you will be giving yourself the shot, your doctor or nurse will teach you how to give the shot.  Keep taking this drug as you have been told  by your doctor or other health care provider, even if you feel well.  Take the same day each week.  Move site where you give the shot each time.  Take with or without food.  Wash your hands before and after use.  Do not use if the solution is leaking or has particles.  This drug is colorless to a faint yellow. Do not use if the solution changes color.  Do not move this drug from the pen to a syringe.  Each pen or vial is for 1 use only. Throw away any part of the used pen after the dose is given.  Throw away needles in a needle/sharp disposal box. Do not reuse needles or other items. When the box is full, follow all local rules for getting rid of it. Talk with a doctor or pharmacist if you have any questions. If you are using this drug for diabetes:  If you are also using insulin , you may inject this drug and the insulin  in the same area of the body but not right next to each other.  Do not mix this drug in the same syringe with insulin . What do I do if I miss a dose?  If it is within 4 days after the missed dose, take the missed dose and go back to your normal day.  If it has been more than 4 days since the missed dose, skip the missed dose and go back to your normal day.  Do not take 2 doses at the same time or extra doses. How do I store and/or throw out this drug?  Store in a refrigerator. Do not freeze.  Do not use if it has been frozen.  If needed, each pen or vial may be stored at room temperature for up to 21 days. If you store at room temperature, throw away any part not used after 21 days.  Protect from heat.  Store in the original container to protect from light.  Keep all drugs in a safe place. Keep all drugs out of the reach of children and pets.  Throw away unused or  expired drugs. Do not flush down a toilet or pour down a drain unless you are told to do so. Check with your pharmacist if you have questions about the best way to throw out drugs. There may be drug take-back  programs in your area. General drug facts  If your symptoms or health problems do not get better or if they become worse, call your doctor.  Do not share your drugs with others and do not take anyone else's drugs.  Some drugs may have another patient information leaflet. If you have any questions about this drug, please talk with your doctor, nurse, pharmacist, or other health care provider.  If you think there has been an overdose, call your poison control center or get medical care right away. Be ready to tell or show what was taken, how much, and when it happened. Last Reviewed Date 2022-04-03 Consumer Information Use and Disclaimer This generalized information is a limited summary of diagnosis, treatment, and/or medication information. It is not meant to be comprehensive and should be used as a tool to help the user understand and/or assess potential diagnostic and treatment options. It does NOT include all information about conditions, treatments, medications, side effects, or risks that may apply to a specific patient. It is not intended to be medical advice or a substitute for the medical advice, diagnosis, or treatment of a health care provider based on the health care provider's examination and assessment of a patient's specific and unique circumstances. Patients must speak with a health care provider for complete information about their health, medical questions, and treatment options, including any risks or benefits regarding use of medications. This information does not endorse any treatments or medications as safe, effective, or approved for treating a specific patient. UpToDate, Inc. and its affiliates disclaim any warranty or liability relating to this information or the use thereof. The use of this information is governed by the Terms of Use, available at https://www.wolterskluwer.com/en/know/clinical-effectiveness-terms.  2023 UpToDate, Inc. and its affiliates and/or licensors. All rights  reserved. Use of UpToDate is subject to the Terms of Use. Topic [id number] Version 16.0

## 2023-09-30 DIAGNOSIS — Q2112 Patent foramen ovale: Secondary | ICD-10-CM | POA: Insufficient documentation

## 2023-09-30 NOTE — Assessment & Plan Note (Signed)
 Following with pulmonology Brittney Arista, NP for lower lobe 2 adjacent solid nodules- planning repeat CT Chest 03/2024. Will follow.

## 2023-09-30 NOTE — Assessment & Plan Note (Signed)
 She is scheduled to see cardiology for positive bubble study, 10/12/23. Will follow.

## 2023-09-30 NOTE — Assessment & Plan Note (Signed)
 Chronic, uncontrolled.  Exacerbated by health diagnoses, concerns over health, and estrangement from 2 children, grandchildren.  Continues to follow with counselor.  Advised to continue regimen as prescribed by psychiatry, zoloft , tranxene, propranolol  20 mg tid. Will follow

## 2023-09-30 NOTE — Assessment & Plan Note (Addendum)
 Discussed considering GLP-1, GLP-1/GIP to aid in weight loss, treatment of OSA, lowering cholesterol due to medication allergies. She will consider Zepbound in particular and let me know.

## 2023-10-01 ENCOUNTER — Telehealth: Payer: Self-pay

## 2023-10-01 DIAGNOSIS — B372 Candidiasis of skin and nail: Secondary | ICD-10-CM

## 2023-10-01 DIAGNOSIS — T7840XS Allergy, unspecified, sequela: Secondary | ICD-10-CM

## 2023-10-01 NOTE — Telephone Encounter (Signed)
 Copied from CRM [id number]. Topic: Clinical - Medical Advice >> Oct 01, 2023  4:03 PM Baldo Levan wrote: Reason for CRM: Patient called in stating she was in for an appointment on 05/14 but her and the doctor were busy discussing her test results and she had a few other things she wanted to add and make sure the doctor was going to call in. Patient is asking for a prescription for an EpiPen  and a new sugar meter. Patient discussed with the nurse at the visit but did not see the nurse after to confirm these were being sent over. Patient also wanted to let Daivd Dub know she has been struggling with yeast under her boobs and has tried many over the counter medications but can not get it to go away. Patient wanted Margaret's recommendation for that as well.  Please advise the patient with a call back.

## 2023-10-04 ENCOUNTER — Telehealth: Payer: Self-pay

## 2023-10-04 DIAGNOSIS — M51361 Other intervertebral disc degeneration, lumbar region with lower extremity pain only: Secondary | ICD-10-CM | POA: Diagnosis not present

## 2023-10-04 DIAGNOSIS — R260 Ataxic gait: Secondary | ICD-10-CM | POA: Diagnosis not present

## 2023-10-04 DIAGNOSIS — R42 Dizziness and giddiness: Secondary | ICD-10-CM | POA: Diagnosis not present

## 2023-10-04 MED ORDER — EPINEPHRINE 0.3 MG/0.3ML IJ SOAJ
0.3000 mg | INTRAMUSCULAR | 1 refills | Status: DC | PRN
Start: 2023-10-04 — End: 2023-10-26

## 2023-10-04 MED ORDER — CLOTRIMAZOLE 1 % EX CREA
1.0000 | TOPICAL_CREAM | Freq: Two times a day (BID) | CUTANEOUS | 1 refills | Status: DC
Start: 2023-10-04 — End: 2023-10-15

## 2023-10-04 MED ORDER — TRUE METRIX BLOOD GLUCOSE TEST VI STRP: ORAL_STRIP | 0 refills | Status: DC

## 2023-10-04 MED ORDER — TRUE METRIX METER W/DEVICE KIT: PACK | 0 refills | Status: DC

## 2023-10-04 NOTE — Telephone Encounter (Signed)
 See message from 10/01/2023.

## 2023-10-04 NOTE — Telephone Encounter (Signed)
 Copied from CRM [id number]. Topic: General - Other >> Oct 04, 2023  3:51 PM Howard Macho wrote: Reason for CRM: patient returning a call to the office for the [facility] and I read the message to her and she states she has been using her mom epi pen. Patient states she has 36 allergic reactions to medications, food, people  and they stated she need to keep an epi pen on her  CB [phone]

## 2023-10-04 NOTE — Telephone Encounter (Signed)
 Call pt  Please refill glucometer as supplies if needed  Last epi pen was 2014 Epinephrine  listed as allergy on chart; please clarify what happened.    I have sent in topical yeast medication to treat rash under her breasts. Sent to [facility].  Important to keep area as dry as she can.  If topical therapy doesn't work, I can send in diflucan.

## 2023-10-04 NOTE — Addendum Note (Signed)
 Addended by: Calista Catching on: 10/04/2023 10:36 AM   Modules accepted: Orders

## 2023-10-04 NOTE — Telephone Encounter (Signed)
 Noted. Sent pt a mychart message

## 2023-10-04 NOTE — Addendum Note (Signed)
 Addended by: Dedra Matsuo G on: 10/04/2023 07:39 PM   Modules accepted: Orders

## 2023-10-04 NOTE — Addendum Note (Signed)
 Addended by: Adilee Lemme on: 10/04/2023 02:38 PM   Modules accepted: Orders

## 2023-10-05 ENCOUNTER — Telehealth: Payer: Self-pay

## 2023-10-05 DIAGNOSIS — T7840XS Allergy, unspecified, sequela: Secondary | ICD-10-CM

## 2023-10-05 NOTE — Telephone Encounter (Signed)
 Spoke to pt. Pt stated that she is allergic to man made epi-pen but needs epi pen on hand incase of emergency. Please advise to confirm okay to let pharmacy know its okay for pt to have

## 2023-10-05 NOTE — Telephone Encounter (Signed)
 Copied from CRM [id number]. Topic: Clinical - Medication Question >> Oct 05, 2023 12:02 PM Clyde Darling P wrote: Reason for CRM: Simon- [facility] pharmacist called to get clarification if the pt is allergic to epi pen or not, he tried to also reached out to pt but pt did not answer, I also tried to contact pt to conference and left a message. Laverta Potters can be reached at [phone]

## 2023-10-06 NOTE — Telephone Encounter (Signed)
 LVM to call back to see what pharmacy she would like to have epi pen sent to and to see what type pf reactions she has from epi pen???

## 2023-10-09 DIAGNOSIS — M5380 Other specified dorsopathies, site unspecified: Secondary | ICD-10-CM | POA: Diagnosis not present

## 2023-10-09 DIAGNOSIS — M5416 Radiculopathy, lumbar region: Secondary | ICD-10-CM | POA: Diagnosis not present

## 2023-10-09 DIAGNOSIS — M47816 Spondylosis without myelopathy or radiculopathy, lumbar region: Secondary | ICD-10-CM | POA: Diagnosis not present

## 2023-10-12 DIAGNOSIS — I10 Essential (primary) hypertension: Secondary | ICD-10-CM | POA: Diagnosis not present

## 2023-10-12 NOTE — Telephone Encounter (Signed)
 noted

## 2023-10-12 NOTE — Telephone Encounter (Signed)
 Called [facility] and gave verbal order to fill the epi pen per Bascom Bossier due to the benefits of the medication outweighs the side effects.

## 2023-10-13 ENCOUNTER — Telehealth: Payer: Self-pay

## 2023-10-13 DIAGNOSIS — B372 Candidiasis of skin and nail: Secondary | ICD-10-CM

## 2023-10-13 NOTE — Telephone Encounter (Signed)
 Copied from CRM [id number]. Topic: Clinical - Prescription Issue >> Oct 12, 2023  4:29 PM Chuck Crater wrote: Reason for CRM: Patient stated that [facility] pharmacy still doesn't have medication for the yeast underneath her breast.

## 2023-10-13 NOTE — Telephone Encounter (Signed)
 Pt informed about epi pen being ready for pick up.

## 2023-10-14 DIAGNOSIS — F321 Major depressive disorder, single episode, moderate: Secondary | ICD-10-CM | POA: Diagnosis not present

## 2023-10-15 MED ORDER — FLUCONAZOLE 150 MG PO TABS: 150.0000 mg | ORAL_TABLET | Freq: Once | ORAL | 0 refills | Status: AC

## 2023-10-15 MED ORDER — CLOTRIMAZOLE 1 % EX CREA: 1.0000 | TOPICAL_CREAM | Freq: Two times a day (BID) | CUTANEOUS | 1 refills | Status: DC

## 2023-10-15 NOTE — Addendum Note (Signed)
 Addended by: Calista Catching on: 10/15/2023 08:03 AM   Modules accepted: Orders

## 2023-10-15 NOTE — Telephone Encounter (Signed)
 Call pt  I had sent in clotrimazole  ointment 10/04/2023.  I have resent this.  Today, I have  also sent in oral antifungal Diflucan if symptoms are severe.  Please schedule appointment with me if symptoms do not resolve

## 2023-10-18 DIAGNOSIS — M5416 Radiculopathy, lumbar region: Secondary | ICD-10-CM | POA: Diagnosis not present

## 2023-10-18 DIAGNOSIS — M51361 Other intervertebral disc degeneration, lumbar region with lower extremity pain only: Secondary | ICD-10-CM | POA: Diagnosis not present

## 2023-10-18 DIAGNOSIS — M5412 Radiculopathy, cervical region: Secondary | ICD-10-CM | POA: Diagnosis not present

## 2023-10-18 DIAGNOSIS — M47816 Spondylosis without myelopathy or radiculopathy, lumbar region: Secondary | ICD-10-CM | POA: Diagnosis not present

## 2023-10-18 DIAGNOSIS — M5380 Other specified dorsopathies, site unspecified: Secondary | ICD-10-CM | POA: Diagnosis not present

## 2023-10-20 DIAGNOSIS — M5442 Lumbago with sciatica, left side: Secondary | ICD-10-CM | POA: Diagnosis not present

## 2023-10-20 DIAGNOSIS — M5441 Lumbago with sciatica, right side: Secondary | ICD-10-CM | POA: Diagnosis not present

## 2023-10-20 DIAGNOSIS — G8929 Other chronic pain: Secondary | ICD-10-CM | POA: Diagnosis not present

## 2023-10-20 DIAGNOSIS — M5416 Radiculopathy, lumbar region: Secondary | ICD-10-CM | POA: Diagnosis not present

## 2023-10-25 ENCOUNTER — Telehealth: Payer: Self-pay | Admitting: Family

## 2023-10-25 DIAGNOSIS — R42 Dizziness and giddiness: Secondary | ICD-10-CM | POA: Diagnosis not present

## 2023-10-25 DIAGNOSIS — T7840XS Allergy, unspecified, sequela: Secondary | ICD-10-CM

## 2023-10-25 DIAGNOSIS — R260 Ataxic gait: Secondary | ICD-10-CM | POA: Diagnosis not present

## 2023-10-25 NOTE — Telephone Encounter (Signed)
 LVM to call back and discuss message below  Call pt  Rec'ed note from [facility] regarding prescription for epi pen   Was she able to get?    I wanted to ensure she had as then I would disregard     I thought she picked up at local pharmac

## 2023-10-25 NOTE — Telephone Encounter (Signed)
 Call pt  Rec'ed note from [facility] regarding prescription for epi pen  Was she able to get?   I wanted to ensure she had as then I would disregard   I thought she picked up at local pharmacy

## 2023-10-26 ENCOUNTER — Telehealth: Payer: Self-pay

## 2023-10-26 ENCOUNTER — Telehealth: Payer: Self-pay | Admitting: Family

## 2023-10-26 DIAGNOSIS — E7849 Other hyperlipidemia: Secondary | ICD-10-CM | POA: Diagnosis not present

## 2023-10-26 DIAGNOSIS — F321 Major depressive disorder, single episode, moderate: Secondary | ICD-10-CM | POA: Diagnosis not present

## 2023-10-26 DIAGNOSIS — E669 Obesity, unspecified: Secondary | ICD-10-CM | POA: Diagnosis not present

## 2023-10-26 DIAGNOSIS — T7840XS Allergy, unspecified, sequela: Secondary | ICD-10-CM

## 2023-10-26 DIAGNOSIS — K9049 Malabsorption due to intolerance, not elsewhere classified: Secondary | ICD-10-CM

## 2023-10-26 MED ORDER — EPINEPHRINE 0.3 MG/0.3ML IJ SOAJ: 0.3000 mg | INTRAMUSCULAR | 1 refills | Status: AC | PRN

## 2023-10-26 NOTE — Telephone Encounter (Signed)
 NOTED

## 2023-10-26 NOTE — Addendum Note (Signed)
 Addended by: Trisha Ken on: 10/26/2023 01:32 PM   Modules accepted: Orders

## 2023-10-26 NOTE — Telephone Encounter (Signed)
 Copied from CRM [id number]. Topic: General - Other >> Oct 26, 2023 12:27 PM Allyne Areola wrote: Reason for CRM: Patient is returning a called she received from [facility], advised of the previous message regarding the Epi Pen being resent to [facility].

## 2023-10-26 NOTE — Addendum Note (Signed)
 Addended by: Calista Catching on: 10/26/2023 12:09 PM   Modules accepted: Orders

## 2023-10-26 NOTE — Telephone Encounter (Signed)
 Copied from CRM [id number]. Topic: Clinical - Medication Question >> Oct 25, 2023  4:31 PM Chuck Crater wrote: Reason for CRM: Patient received a call this morning. Advised patient of message. She stated that she did receive it however the EPI Pen is suppose to go to [facility].

## 2023-10-26 NOTE — Telephone Encounter (Signed)
 LVM to call back.

## 2023-10-26 NOTE — Telephone Encounter (Signed)
 See previous note pt did receive epipen 

## 2023-10-26 NOTE — Telephone Encounter (Signed)
 Copied from CRM [id number]. Topic: Referral - Request for Referral >> Oct 25, 2023  4:27 PM Chuck Crater wrote: Did the patient discuss referral with their provider in the last year? Yes (If No - schedule appointment) (If Yes - send message)  Appointment offered? Yes  Type of order/referral and detailed reason for visit: Referral to [facility] due to her dairy intolerance  Preference of office, provider, location: [facility]/ clinics  If referral order, have you been seen by this specialty before? Yes through [facility] in [place] and [place] (If Yes, this issue or another issue? When? Where?  Can we respond through MyChart? Yes

## 2023-10-26 NOTE — Telephone Encounter (Signed)
 Noted

## 2023-10-26 NOTE — Telephone Encounter (Signed)
 LVM again to call back and discuss message below   Call pt  Rec'ed note from [facility] regarding prescription for epi pen   Was she able to get?    I wanted to ensure she had as then I would disregard     I thought she picked up at local pharmac

## 2023-10-28 DIAGNOSIS — G5603 Carpal tunnel syndrome, bilateral upper limbs: Secondary | ICD-10-CM | POA: Diagnosis not present

## 2023-10-28 DIAGNOSIS — R202 Paresthesia of skin: Secondary | ICD-10-CM | POA: Diagnosis not present

## 2023-10-28 DIAGNOSIS — M79645 Pain in left finger(s): Secondary | ICD-10-CM | POA: Diagnosis not present

## 2023-10-28 DIAGNOSIS — R200 Anesthesia of skin: Secondary | ICD-10-CM | POA: Diagnosis not present

## 2023-10-29 DIAGNOSIS — M51361 Other intervertebral disc degeneration, lumbar region with lower extremity pain only: Secondary | ICD-10-CM | POA: Diagnosis not present

## 2023-10-29 DIAGNOSIS — R531 Weakness: Secondary | ICD-10-CM | POA: Diagnosis not present

## 2023-11-01 DIAGNOSIS — R202 Paresthesia of skin: Secondary | ICD-10-CM | POA: Diagnosis not present

## 2023-11-01 DIAGNOSIS — R200 Anesthesia of skin: Secondary | ICD-10-CM | POA: Diagnosis not present

## 2023-11-02 DIAGNOSIS — F321 Major depressive disorder, single episode, moderate: Secondary | ICD-10-CM | POA: Diagnosis not present

## 2023-11-02 DIAGNOSIS — Z1330 Encounter for screening examination for mental health and behavioral disorders, unspecified: Secondary | ICD-10-CM | POA: Diagnosis not present

## 2023-11-03 ENCOUNTER — Ambulatory Visit: Admitting: Family

## 2023-11-03 DIAGNOSIS — F321 Major depressive disorder, single episode, moderate: Secondary | ICD-10-CM | POA: Diagnosis not present

## 2023-11-05 NOTE — Telephone Encounter (Signed)
 Spoke to pt and she gave  fax number:[phone] to [facility] faxed referral over to them received confirmation that they received it

## 2023-11-05 NOTE — Telephone Encounter (Unsigned)
 Copied from CRM [id number]. Topic: General - Other >> Nov 05, 2023  2:03 PM Annelle Kiel wrote: Reason for CRM: patient is needing a call back regarding her referral to [facility] dr   fax [phone]   is where the referral needs to go to

## 2023-11-08 ENCOUNTER — Other Ambulatory Visit: Payer: Self-pay | Admitting: Family

## 2023-11-08 DIAGNOSIS — R42 Dizziness and giddiness: Secondary | ICD-10-CM | POA: Diagnosis not present

## 2023-11-08 DIAGNOSIS — R260 Ataxic gait: Secondary | ICD-10-CM | POA: Diagnosis not present

## 2023-11-12 DIAGNOSIS — D329 Benign neoplasm of meninges, unspecified: Secondary | ICD-10-CM | POA: Diagnosis not present

## 2023-11-12 DIAGNOSIS — H539 Unspecified visual disturbance: Secondary | ICD-10-CM | POA: Diagnosis not present

## 2023-11-12 DIAGNOSIS — M51361 Other intervertebral disc degeneration, lumbar region with lower extremity pain only: Secondary | ICD-10-CM | POA: Diagnosis not present

## 2023-11-15 ENCOUNTER — Encounter (INDEPENDENT_AMBULATORY_CARE_PROVIDER_SITE_OTHER): Payer: Self-pay | Admitting: Adult Health

## 2023-11-15 ENCOUNTER — Ambulatory Visit (INDEPENDENT_AMBULATORY_CARE_PROVIDER_SITE_OTHER): Payer: Self-pay | Admitting: Adult Health

## 2023-11-15 VITALS — BP 125/84 | HR 64 | Temp 97.9°F | Ht 62.0 in | Wt 200.0 lb

## 2023-11-15 DIAGNOSIS — E782 Mixed hyperlipidemia: Secondary | ICD-10-CM | POA: Diagnosis not present

## 2023-11-15 DIAGNOSIS — R0602 Shortness of breath: Secondary | ICD-10-CM

## 2023-11-15 DIAGNOSIS — Z6836 Body mass index (BMI) 36.0-36.9, adult: Secondary | ICD-10-CM

## 2023-11-15 DIAGNOSIS — E669 Obesity, unspecified: Secondary | ICD-10-CM | POA: Diagnosis not present

## 2023-11-15 DIAGNOSIS — R7303 Prediabetes: Secondary | ICD-10-CM

## 2023-11-15 DIAGNOSIS — E559 Vitamin D deficiency, unspecified: Secondary | ICD-10-CM

## 2023-11-15 DIAGNOSIS — Z6838 Body mass index (BMI) 38.0-38.9, adult: Secondary | ICD-10-CM

## 2023-11-15 DIAGNOSIS — Z789 Other specified health status: Secondary | ICD-10-CM

## 2023-11-15 NOTE — Progress Notes (Unsigned)
 WEIGHT SUMMARY AND BIOMETRICS  Vitals Temp: 97.9 F (36.6 C) BP: 125/84 Pulse Rate: 64 SpO2: 99 %   Anthropometric Measurements Height: 5' 2 (1.575 m) Weight: 200 lb (90.7 kg) BMI (Calculated): 36.57 Weight at Last Visit: 207lb Weight Lost Since Last Visit: 7lb Weight Gained Since Last Visit: 0lb Starting Weight: 217lb Total Weight Loss (lbs): 17 lb (7.711 kg)   Body Composition  Body Fat %: 48.2 % Fat Mass (lbs): 96.4 lbs Muscle Mass (lbs): 98.4 lbs Total Body Water (lbs): 75.8 lbs Visceral Fat Rating : 14   Other Clinical Data RMR: 1570 Fasting: Yes Labs: Yes Today's Visit #: 75 Starting Date: 06/01/19    Chief Complaint:   OBESITY Brittney Ruiz is here to discuss her progress with her obesity treatment plan.  She is on the the Category 2 Plan and states she is following her eating plan approximately 50 % of the time.  She states she is exercising Physical Therapy 60 minutes 1 times per week.  Interim History:  Last OV at [facility] 06/01/2023 with Dr. Midge  LOST TO FOLLOW-UP Reviewed myriad of health concerns/events that prevented her from being seen at [facility]  She will reestablish with Dr. [name] tomorrow  She has been following tenets of Cat 2 MP, down 7 lbs since last weigh in Jan 2025  Of note- Her step-mother passed away last week  Subjective:   1. Shortness of breath  05/04/23 11:00  RMR 1714    11/15/23 13:00  RMR 1570   RMR decreased, however faster than anticipated  2. Prediabetes Lab Results  Component Value Date   HGBA1C 5.6 02/04/2023   HGBA1C 5.7 (H) 07/30/2022   HGBA1C 5.5 02/02/2022    Not currently on any antidiabtic medications. Declined Metformin  in past, as her husband temporarily lost his eye sight while on Metformin  ???  3. Vitamin D  deficiency  Latest Reference Range & Units 02/04/23 15:55  Vitamin D , 25-Hydroxy 30.0 - 100.0 ng/mL 46.4   She endorses fatigue  4. Mixed hyperlipidemia Lipid Panel     Component Value  Date/Time   CHOL 160 02/04/2023 1555   TRIG 195 (H) 02/04/2023 1555   HDL 40 02/04/2023 1555   CHOLHDL 7.2 (H) 07/30/2022 1140   CHOLHDL 8 05/02/2018 1457   VLDL 72.0 (H) 05/02/2018 1457   LDLCALC 87 02/04/2023 1555   LDLDIRECT 141.0 05/02/2018 1457   LABVLDL 33 02/04/2023 1555    When on statin therapy she experienced profound, diffuse myalgias  5. Statin intolerance She has tried lipitor, crestor, zetia - all stopped due to myalgia.   She had myalgia and trouble sleeping on repatha .     Leqvio  also caused skin reactiion       Assessment/Plan:   1. Shortness of breath Convert from Cat 2 to Cat 1 meal plan  2. Prediabetes (Primary) Check Labs - Hemoglobin A1c - Insulin , random - Vitamin B12  3. Vitamin D  deficiency Check Labs - VITAMIN D  25 Hydroxy (Vit-D Deficiency, Fractures)  4. Mixed hyperlipidemia Check Labs - Comprehensive metabolic panel with GFR - Lipid panel  5. Statin intolerance Remain off statin therapy Limit sat fat  6. BMI 38.0-38.9,adult, current BMI 36.6  Brittney Ruiz is not currently in the action stage of change. As such, her goal is to get back to weightloss efforts . She has agreed to the Category 1 Plan.   Handouts: CAT 1 MP and grocery lists 100 cal Snack List  MyChart Message sent to pt: Lactose Free Diet  Suggestions  Exercise goals: All adults should avoid inactivity. Some physical activity is better than none, and adults who participate in any amount of physical activity gain some health benefits. Adults should also include muscle-strengthening activities that involve all major muscle groups on 2 or more days a week.  Behavioral modification strategies: increasing lean protein intake, decreasing simple carbohydrates, increasing vegetables, increasing water intake, no skipping meals, meal planning and cooking strategies, keeping healthy foods in the home, ways to avoid boredom eating, and planning for success.  Brittney Ruiz has agreed to follow-up  with our clinic in 4 weeks. She was informed of the importance of frequent follow-up visits to maximize her success with intensive lifestyle modifications for her multiple health conditions.   Brittney Ruiz was informed we would discuss her lab results at her next visit unless there is a critical issue that needs to be addressed sooner. Brittney Ruiz agreed to keep her next visit at the agreed upon time to discuss these results.  Objective:   Blood pressure 125/84, pulse 64, temperature 97.9 F (36.6 C), height 5' 2 (1.575 m), weight 200 lb (90.7 kg), SpO2 99%. Body mass index is 36.58 kg/m.  General: Cooperative, alert, well developed, in no acute distress. HEENT: Conjunctivae and lids unremarkable. Cardiovascular: Regular rhythm.  Lungs: Normal work of breathing. Neurologic: No focal deficits.   Lab Results  Component Value Date   CREATININE 0.63 06/17/2023   BUN 20 06/17/2023   NA 139 06/17/2023   K 4.3 06/17/2023   CL 102 06/17/2023   CO2 25 06/17/2023   Lab Results  Component Value Date   ALT 77 (H) 03/11/2023   AST 61 (H) 03/11/2023   ALKPHOS 74 03/11/2023   BILITOT 0.4 03/11/2023   Lab Results  Component Value Date   HGBA1C 5.6 02/04/2023   HGBA1C 5.7 (H) 07/30/2022   HGBA1C 5.5 02/02/2022   HGBA1C 5.3 06/09/2021   HGBA1C 5.5 12/04/2020   Lab Results  Component Value Date   INSULIN  12.3 02/04/2023   INSULIN  7.9 07/30/2022   INSULIN  16.0 02/02/2022   INSULIN  8.2 06/09/2021   INSULIN  10.4 12/04/2020   Lab Results  Component Value Date   TSH 1.830 02/04/2023   Lab Results  Component Value Date   CHOL 160 02/04/2023   HDL 40 02/04/2023   LDLCALC 87 02/04/2023   LDLDIRECT 141.0 05/02/2018   TRIG 195 (H) 02/04/2023   CHOLHDL 7.2 (H) 07/30/2022   Lab Results  Component Value Date   VD25OH 46.4 02/04/2023   VD25OH 44.0 07/30/2022   VD25OH 48.7 06/09/2021   Lab Results  Component Value Date   WBC 7.6 06/17/2023   HGB 15.5 (H) 06/17/2023   HCT 45.6 06/17/2023    MCV 86.2 06/17/2023   PLT 155 06/17/2023   Lab Results  Component Value Date   IRON 77 04/21/2021   TIBC 283 04/21/2021   FERRITIN 336 (H) 04/21/2021   Attestation Statements:   Reviewed by clinician on day of visit: allergies, medications, problem list, medical history, surgical history, family history, social history, and previous encounter notes.  I have reviewed the above documentation for accuracy and completeness, and I agree with the above. -  Aronda Burford d. Shai Rasmussen, NP-C

## 2023-11-16 ENCOUNTER — Telehealth (INDEPENDENT_AMBULATORY_CARE_PROVIDER_SITE_OTHER): Admitting: Psychology

## 2023-11-16 DIAGNOSIS — F5089 Other specified eating disorder: Secondary | ICD-10-CM

## 2023-11-16 DIAGNOSIS — F32A Depression, unspecified: Secondary | ICD-10-CM | POA: Diagnosis not present

## 2023-11-16 DIAGNOSIS — F419 Anxiety disorder, unspecified: Secondary | ICD-10-CM | POA: Diagnosis not present

## 2023-11-16 LAB — COMPREHENSIVE METABOLIC PANEL WITH GFR
ALT: 29 IU/L (ref 0–32)
AST: 24 IU/L (ref 0–40)
Albumin: 4.5 g/dL (ref 3.8–4.9)
Alkaline Phosphatase: 83 IU/L (ref 44–121)
BUN/Creatinine Ratio: 16 (ref 9–23)
BUN: 12 mg/dL (ref 6–24)
Bilirubin Total: 0.4 mg/dL (ref 0.0–1.2)
CO2: 20 mmol/L (ref 20–29)
Calcium: 9.5 mg/dL (ref 8.7–10.2)
Chloride: 101 mmol/L (ref 96–106)
Creatinine, Ser: 0.77 mg/dL (ref 0.57–1.00)
Globulin, Total: 1.8 g/dL (ref 1.5–4.5)
Glucose: 88 mg/dL (ref 70–99)
Potassium: 4.1 mmol/L (ref 3.5–5.2)
Sodium: 139 mmol/L (ref 134–144)
Total Protein: 6.3 g/dL (ref 6.0–8.5)
eGFR: 90 mL/min/{1.73_m2} (ref >59)

## 2023-11-16 LAB — VITAMIN D 25 HYDROXY (VIT D DEFICIENCY, FRACTURES): Vit D, 25-Hydroxy: 36.6 ng/mL (ref 30.0–100.0)

## 2023-11-16 LAB — LIPID PANEL
Chol/HDL Ratio: 5.1 ratio — ABNORMAL HIGH (ref 0.0–4.4)
Cholesterol, Total: 180 mg/dL (ref 100–199)
HDL: 35 mg/dL — ABNORMAL LOW (ref >39)
LDL Chol Calc (NIH): 116 mg/dL — ABNORMAL HIGH (ref 0–99)
Triglycerides: 164 mg/dL — ABNORMAL HIGH (ref 0–149)
VLDL Cholesterol Cal: 29 mg/dL (ref 5–40)

## 2023-11-16 LAB — HEMOGLOBIN A1C
Est. average glucose Bld gHb Est-mCnc: 111 mg/dL
Hgb A1c MFr Bld: 5.5 % (ref 4.8–5.6)

## 2023-11-16 LAB — INSULIN, RANDOM: INSULIN: 12.1 u[IU]/mL (ref 2.6–24.9)

## 2023-11-16 LAB — VITAMIN B12: Vitamin B-12: 659 pg/mL (ref 232–1245)

## 2023-11-16 NOTE — Progress Notes (Signed)
  Office: [phone]  /  Fax: [phone]    Date: November 16, 2023  Appointment Start Time: 3:02pm Duration: 38 minutes Provider: Wyatt Fire, Psy.D. Type of Session: Individual Therapy  Location of Patient: Home (private location) Location of Provider: Provider's Home (private office) Type of Contact: Telepsychological Visit via MyChart Video Visit  Session Content: Brittney Ruiz is a 58 y.o. female presenting for a follow-up appointment to address the previously established treatment goal of increasing coping skills.Today's appointment was a telepsychological visit. Brittney Ruiz provided verbal consent for today's telepsychological appointment and she is aware she is responsible for securing confidentiality on her end of the session. Prior to proceeding with today's appointment, Brittney Ruiz's physical location at the time of this appointment was obtained as well a phone number she could be reached at in the event of technical difficulties. Brittney Ruiz and this provider participated in today's telepsychological service.   This provider conducted a check-in as the last appointment with this provider was on 06/29/2023. She previously opted to take a break due to feeling overwhelmed with various medical appointments. She also shared about her appointment yesterday with [facility], noting she lost seven pounds but her RMR has slowed down. She further shared about her experience with [facility], noting her last appointment with them was two months ago. Currently, she is participating in a 12-week virtual group therapy for depression and anxiety with [facility], noting her goal is to establish care with a primary therapist following the group. Moreover, Brittney Ruiz stated various specialists from her treatment team are recommending she start Zepbound. She also noted feeling GLP1s are being pushed on her. Further explored and associated thoughts and feelings were processed. Overall, Brittney Ruiz was receptive to today's appointment as  evidenced by openness to sharing and responsiveness to feedback.  Mental Status Examination:  Appearance: neat Behavior: appropriate to circumstances Mood: sad Affect: mood congruent Speech: WNL Eye Contact: appropriate Psychomotor Activity: WNL Gait: unable to assess Thought Process: linear, logical, and goal directed and denies suicidal, homicidal, and self-harm ideation, plan and intent since the last appointment with this provider  Thought Content/Perception: no hallucinations, delusions, bizarre thinking or behavior endorsed or observed Orientation: AAOx4 Memory/Concentration: intact Insight: fair Judgment: fair  Interventions:  Conducted a brief chart review Provided empathic reflections and validation Employed supportive psychotherapy interventions to facilitate reduced distress and to improve coping skills with identified stressors Employed motivational interviewing skills to assess patient's willingness/desire to adhere to recommended medical treatments and assignments Engaged patient in thought challenging/cognitive restructuring   DSM-5 Diagnosis(es): F50.89 Other Specified Feeding or Eating Disorder, Emotional Eating Behaviors, F41.9 Unspecified Anxiety Disorder, and  F32.A Unspecified Depressive Disorder  Treatment Goal & Progress:  During the initial appointment with this provider, the following treatment goal was established: increase coping skills. Brittney Ruiz has demonstrated progress in her goal as evidenced by increased awareness of hunger patterns and increased awareness of triggers for emotional eating behaviors. Brittney Ruiz also continues to demonstrate willingness to engage in learned skill(s).   Plan: The next appointment is scheduled for 12/07/2023 at 2pm, which will be via MyChart Video Visit. The next session will focus on working towards the established treatment goal. She will continue with [facility].    Wyatt Fire, PsyD

## 2023-11-17 ENCOUNTER — Ambulatory Visit (INDEPENDENT_AMBULATORY_CARE_PROVIDER_SITE_OTHER): Payer: Self-pay | Admitting: Adult Health

## 2023-11-17 DIAGNOSIS — F321 Major depressive disorder, single episode, moderate: Secondary | ICD-10-CM | POA: Diagnosis not present

## 2023-11-17 DIAGNOSIS — F411 Generalized anxiety disorder: Secondary | ICD-10-CM | POA: Diagnosis not present

## 2023-11-22 NOTE — Telephone Encounter (Unsigned)
 Copied from CRM [id number]. Topic: Clinical - Prescription Issue >> Nov 22, 2023  3:57 PM Drema MATSU wrote: Reason for CRM: Patient stated that at her last appointment her pcp was suppose to call in refills for a year for fluticasone  (FLONASE ) 50 MCG/ACT nasal spray and  omeprazole  (PRILOSEC) 20 MG capsule so she wouldn't have to call in every month. Please call patient with an update.

## 2023-11-24 ENCOUNTER — Encounter: Payer: Self-pay | Admitting: Family

## 2023-11-25 ENCOUNTER — Other Ambulatory Visit: Payer: Self-pay | Admitting: Family

## 2023-11-25 DIAGNOSIS — G4733 Obstructive sleep apnea (adult) (pediatric): Secondary | ICD-10-CM

## 2023-11-25 MED ORDER — TIRZEPATIDE-WEIGHT MANAGEMENT 2.5 MG/0.5ML ~~LOC~~ SOLN: 2.5000 mg | SUBCUTANEOUS | 2 refills | Status: DC

## 2023-11-26 ENCOUNTER — Telehealth: Payer: Self-pay

## 2023-11-26 NOTE — Telephone Encounter (Signed)
 Copied from CRM [id number]. Topic: Referral - Question >> Nov 26, 2023  4:14 PM Aisha D wrote: Reason for CRM: Pt stated that a referral was made for [facility]. Pt stated that the original referral had a provider that [facility] states they don't schedule for and is not located at their office. Pt stated that [facility] informed her that they sent a notice to the office explaining that the referral needed to be corrected due to that error. Pt is requesting that another referral to [facility] be created and that it doesn't include a providers name. Pt would like a callback regarding this concern.

## 2023-11-27 DIAGNOSIS — M47816 Spondylosis without myelopathy or radiculopathy, lumbar region: Secondary | ICD-10-CM | POA: Diagnosis not present

## 2023-11-27 DIAGNOSIS — M5380 Other specified dorsopathies, site unspecified: Secondary | ICD-10-CM | POA: Diagnosis not present

## 2023-11-27 DIAGNOSIS — M5416 Radiculopathy, lumbar region: Secondary | ICD-10-CM | POA: Diagnosis not present

## 2023-11-29 NOTE — Telephone Encounter (Signed)
 Noted

## 2023-11-30 ENCOUNTER — Telehealth: Payer: Self-pay

## 2023-11-30 ENCOUNTER — Other Ambulatory Visit (HOSPITAL_COMMUNITY): Payer: Self-pay

## 2023-11-30 DIAGNOSIS — G4733 Obstructive sleep apnea (adult) (pediatric): Secondary | ICD-10-CM

## 2023-11-30 NOTE — Telephone Encounter (Signed)
 Pharmacy Patient Advocate Encounter   Received notification from Patient Advice Request messages that prior authorization for Zepbound  2.5MG /0.5ML pen-injectors is required/requested.   Insurance verification completed.   The patient is insured through [facility] .   Per test claim: PA required and submitted KEY/EOC/Request #: [id number] due to:  [facility] is not able to process this request because the previous Prior Authorization Appeal Request was Denied, please contact [employer]. at [phone] or fax in expedited request to [phone] and standard request to [phone].   Appeal will have to be submitted. Since the PA team did not initiate the PA, the office will have to submit the appeal.

## 2023-12-01 ENCOUNTER — Telehealth: Payer: Self-pay

## 2023-12-01 NOTE — Telephone Encounter (Signed)
 Copied from CRM [id number]. Topic: Referral - Question >> Dec 01, 2023  2:49 PM Jayma L wrote: Reason for CRM: patient was sent to [facility] , but when paperwork was sent it had wrong drs name on it, needs updated information on it was sent back . This is for GI

## 2023-12-02 ENCOUNTER — Other Ambulatory Visit: Payer: Self-pay | Admitting: Family

## 2023-12-02 DIAGNOSIS — K9049 Malabsorption due to intolerance, not elsewhere classified: Secondary | ICD-10-CM

## 2023-12-02 NOTE — Telephone Encounter (Signed)
 Spoke to pt she stated   she needs referral to [facility]

## 2023-12-02 NOTE — Telephone Encounter (Signed)
 Call pt  Received a note that she needs GI referral.  I do not know the reason for this She previously saw Dr Therisa Colonoscopy is uptodate  He is with [facility] now, does she want a referral back to him?

## 2023-12-02 NOTE — Telephone Encounter (Signed)
 Manuelita, Can you help me understand why zepbound  is denied?  She has OSA  Any help would be greatly appreciated

## 2023-12-03 ENCOUNTER — Other Ambulatory Visit: Payer: Self-pay | Admitting: Family

## 2023-12-03 MED ORDER — TIRZEPATIDE-WEIGHT MANAGEMENT 2.5 MG/0.5ML ~~LOC~~ SOLN: 2.5000 mg | SUBCUTANEOUS | 2 refills | Status: DC

## 2023-12-03 NOTE — Telephone Encounter (Signed)
 Reordered zepbound  with OSA Sent to [facility]

## 2023-12-03 NOTE — Addendum Note (Signed)
 Addended by: Anyah Swallow G on: 12/03/2023 12:17 PM   Modules accepted: Orders

## 2023-12-03 NOTE — Telephone Encounter (Signed)
 Called pt and she wanted to know why she needs an follow up visit? She stated that she has talk to you at an appt, on Mychart, and on the phone. She stated that she will but wanted to know if it is necessary?

## 2023-12-06 DIAGNOSIS — F411 Generalized anxiety disorder: Secondary | ICD-10-CM | POA: Diagnosis not present

## 2023-12-06 DIAGNOSIS — F4001 Agoraphobia with panic disorder: Secondary | ICD-10-CM | POA: Diagnosis not present

## 2023-12-06 DIAGNOSIS — F410 Panic disorder [episodic paroxysmal anxiety] without agoraphobia: Secondary | ICD-10-CM | POA: Diagnosis not present

## 2023-12-06 DIAGNOSIS — F39 Unspecified mood [affective] disorder: Secondary | ICD-10-CM | POA: Diagnosis not present

## 2023-12-07 ENCOUNTER — Telehealth (INDEPENDENT_AMBULATORY_CARE_PROVIDER_SITE_OTHER): Admitting: Psychology

## 2023-12-07 DIAGNOSIS — F5089 Other specified eating disorder: Secondary | ICD-10-CM | POA: Diagnosis not present

## 2023-12-07 DIAGNOSIS — F419 Anxiety disorder, unspecified: Secondary | ICD-10-CM | POA: Diagnosis not present

## 2023-12-07 DIAGNOSIS — F32A Depression, unspecified: Secondary | ICD-10-CM | POA: Diagnosis not present

## 2023-12-07 NOTE — Progress Notes (Signed)
  Office: [phone]  /  Fax: [phone]    Date: December 07, 2023  Appointment Start Time: 2:01pm Duration: 35 minutes Provider: Wyatt Fire, Psy.D. Type of Session: Individual Therapy  Location of Patient: Home (private location) Location of Provider: Provider's Home (private office) Type of Contact: Telepsychological Visit via MyChart Video Visit  Session Content: Brittney Ruiz is a 58 y.o. female presenting for a follow-up appointment to address the previously established treatment goal of increasing coping skills. Today's appointment was a telepsychological visit. Brittney Ruiz provided verbal consent for today's telepsychological appointment and she is aware she is responsible for securing confidentiality on her end of the session. Prior to proceeding with today's appointment, Brittney Ruiz's physical location at the time of this appointment was obtained as well a phone number she could be reached at in the event of technical difficulties. Brittney Ruiz and this provider participated in today's telepsychological service.   This provider conducted a brief check-in. Brittney Ruiz stated she is going through the appeal process for Zepbound . Regarding her eating habits, she noted, I haven't done what they told me to do. Brittney Ruiz explained she is experiencing dairy intolerance and was provided options by Brittney Dalton, NP via a MyChart message. Additionally, Brittney Ruiz discussed challenges she has experienced in the past 3.5 years related to weight loss and other health concerns. Associated thoughts and feelings were processed. Brittney Ruiz of today's appointment focused on exploring what Brittney Ruiz has control over as it relates to her journey with the clinic. She agreed to focus on the following: explore non-dairy lean protein options, set reminders to eat regularly, and continue focusing on water intake. Overall, Brittney Ruiz was receptive to today's appointment as evidenced by openness to sharing, responsiveness to feedback, and willingness to implement discussed  strategies .  Mental Status Examination:  Appearance: neat Behavior: appropriate to circumstances Mood: neutral Affect: mood congruent Speech: WNL Eye Contact: appropriate Psychomotor Activity: WNL Gait: unable to assess Thought Process: linear, logical, and goal directed and denies suicidal, homicidal, and self-harm ideation, plan and intent since the last appointment with this provider Thought Content/Perception: no hallucinations, delusions, bizarre thinking or behavior endorsed or observed Orientation: AAOx4 Memory/Concentration: intact Insight: fair Judgment: fair  Interventions:  Conducted a brief chart review Conducted a risk assessment Provided empathic reflections and validation Employed supportive psychotherapy interventions to facilitate reduced distress and to improve coping skills with identified stressors Engaged patient in problem solving  DSM-5 Diagnosis(es): F50.89 Other Specified Feeding or Eating Disorder, Emotional Eating Behaviors, F41.9 Unspecified Anxiety Disorder, and  F32.A Unspecified Depressive Disorder  Treatment Goal & Progress: During the initial appointment with this provider, the following treatment goal was established: increase coping skills. Brittney Ruiz has demonstrated progress in her goal as evidenced by increased awareness of hunger patterns and increased awareness of triggers for emotional eating behaviors. Brittney Ruiz also continues to demonstrate willingness to engage in learned skill(s).   Plan: The next appointment is scheduled for 12/27/2023 at 2:30pm, which will be via MyChart Video Visit. The next session will focus on working towards the established treatment goal.  She will continue with [facility].    Wyatt Fire, PsyD

## 2023-12-08 ENCOUNTER — Telehealth: Payer: Self-pay | Admitting: Pharmacist

## 2023-12-08 DIAGNOSIS — G43109 Migraine with aura, not intractable, without status migrainosus: Secondary | ICD-10-CM | POA: Diagnosis not present

## 2023-12-08 DIAGNOSIS — H539 Unspecified visual disturbance: Secondary | ICD-10-CM | POA: Diagnosis not present

## 2023-12-08 DIAGNOSIS — R2990 Unspecified symptoms and signs involving the nervous system: Secondary | ICD-10-CM | POA: Diagnosis not present

## 2023-12-08 NOTE — Telephone Encounter (Signed)
 I have sent patient a mychart message 12/08/23

## 2023-12-08 NOTE — Telephone Encounter (Signed)
 Based on the documentation in the chart, the patient was diagnosed using a home sleep study, which showed an AHI of 8.2, consistent with mild sleep apnea. Previous studies also indicate that there is no evidence of significant obstructive or central sleep-disordered breathing. Zepbound  is indicated for the treatment of moderate to severe obstructive sleep apnea. Given the patient's AHI and current diagnosis, she does not meet the criteria for Zepbound  therapy. Please let me know how you would like to proceed.  Thank you, Devere Pandy, PharmD Clinical Pharmacist  [facility]  Direct Dial: [phone]

## 2023-12-09 NOTE — Telephone Encounter (Signed)
 Copied from CRM #[id number]. Topic: General - Other >> Dec 09, 2023  3:35 PM Aisha D wrote: Reason for CRM: Pt stated that [employer] will be re faxing over the appeals to the office today.

## 2023-12-09 NOTE — Telephone Encounter (Signed)
 Denial paper printed and place in folder for review

## 2023-12-10 NOTE — Telephone Encounter (Addendum)
 Insurance would not allow us  to resubmit PA because it has already been denied. Appeal will have to be submitted. Since the PA team did not initiate the PA, the office will have to submit the appeal.

## 2023-12-10 NOTE — Telephone Encounter (Signed)
 Letter printed and placed in folder for review

## 2023-12-12 NOTE — Telephone Encounter (Signed)
Noted. See mychart note.

## 2023-12-12 NOTE — Telephone Encounter (Signed)
Noted.  See my chart message. 

## 2023-12-14 ENCOUNTER — Encounter (INDEPENDENT_AMBULATORY_CARE_PROVIDER_SITE_OTHER): Payer: Self-pay | Admitting: Adult Health

## 2023-12-14 ENCOUNTER — Ambulatory Visit (INDEPENDENT_AMBULATORY_CARE_PROVIDER_SITE_OTHER): Admitting: Adult Health

## 2023-12-14 VITALS — BP 119/79 | HR 61 | Temp 98.4°F | Ht 62.0 in | Wt 197.0 lb

## 2023-12-14 DIAGNOSIS — Z6838 Body mass index (BMI) 38.0-38.9, adult: Secondary | ICD-10-CM

## 2023-12-14 DIAGNOSIS — K9049 Malabsorption due to intolerance, not elsewhere classified: Secondary | ICD-10-CM

## 2023-12-14 DIAGNOSIS — R7303 Prediabetes: Secondary | ICD-10-CM | POA: Diagnosis not present

## 2023-12-14 DIAGNOSIS — E669 Obesity, unspecified: Secondary | ICD-10-CM | POA: Diagnosis not present

## 2023-12-14 DIAGNOSIS — E559 Vitamin D deficiency, unspecified: Secondary | ICD-10-CM | POA: Diagnosis not present

## 2023-12-14 DIAGNOSIS — E782 Mixed hyperlipidemia: Secondary | ICD-10-CM

## 2023-12-14 DIAGNOSIS — R3915 Urgency of urination: Secondary | ICD-10-CM

## 2023-12-14 DIAGNOSIS — Z6836 Body mass index (BMI) 36.0-36.9, adult: Secondary | ICD-10-CM | POA: Diagnosis not present

## 2023-12-14 NOTE — Progress Notes (Signed)
 WEIGHT SUMMARY AND BIOMETRICS  Vitals Temp: 98.4 F (36.9 C) BP: 119/79 Pulse Rate: 61 SpO2: 96 %   Anthropometric Measurements Height: 5' 2 (1.575 m) Weight: 197 lb (89.4 kg) BMI (Calculated): 36.02 Weight at Last Visit: 200 lb Weight Lost Since Last Visit: 3 lb Weight Gained Since Last Visit: 0 Starting Weight: 217 lb Total Weight Loss (lbs): 20 lb (9.072 kg)   Body Composition  Body Fat %: 47.1 % Fat Mass (lbs): 92.8 lbs Muscle Mass (lbs): 99.2 lbs Total Body Water (lbs): 74 lbs Visceral Fat Rating : 13   Other Clinical Data RMR: [id number] Fasting: no Labs: no Today's Visit #: 42 Starting Date: 06/01/19    Chief Complaint:   OBESITY Brittney Ruiz is here to discuss her progress with her obesity treatment plan.  She is on the the Category 2 Plan and states she is following her eating plan approximately 50-60 % of the time.  She states she is exercising PT/Pedal Bike 30/30 minutes 1/3 times per week  Interim History:  Last OV at [facility] 06/01/2023 with Dr. Midge  LOST TO FOLLOW-UP  Re-established with [facility] on 11/15/2023- fasting labs completed  Her PCP has recently referred her to [facility].  She reports recent urinary urgency/incontinence   Reviewed Bioimpedance results with pt: Muscle Mass: +0.8 lb Adipose Mass: -3.6 lbs  Subjective:   1. Prediabetes Discussed Labs  Latest Reference Range & Units 11/15/23 15:06  Glucose 70 - 99 mg/dL 88  Hemoglobin J8R 4.8 - 5.6 % 5.5  Est. average glucose Bld gHb Est-mCnc mg/dL 888  INSULIN  2.6 - 24.9 uIU/mL 12.1   CBG, A1c at goal Insulin  slightly elevated above of 5   2. Dairy product intolerance Brittney Ruiz reports vast intolerances to diary and a myriad of other foods and medications- please see SNAPSHOT PCP recently referred her to an Allergist and Gastro  3. Mixed hyperlipidemia Discussed Labs Lipid Panel     Component Value Date/Time   CHOL 180 11/15/2023 1506   TRIG 164 (H)  11/15/2023 1506   HDL 35 (L) 11/15/2023 1506   CHOLHDL 5.1 (H) 11/15/2023 1506   CHOLHDL 8 05/02/2018 1457   VLDL 72.0 (H) 05/02/2018 1457   LDLCALC 116 (H) 11/15/2023 1506   LDLDIRECT 141.0 05/02/2018 1457   LABVLDL 29 11/15/2023 1506    TGs, LDLs both above goal, however greatly improved since last check.  The 10-year ASCVD risk score (Arnett DK, et al., 2019) is: 3.4%   Values used to calculate the score:     Age: 58 years     Clincally relevant sex: Female     Is Non-Hispanic African American: No     Diabetic: No     Tobacco smoker: No     Systolic Blood Pressure: 119 mmHg     Is BP treated: Yes     HDL Cholesterol: 35 mg/dL     Total Cholesterol: 180 mg/dL   ASCVD Risk Stratification is stable  4. Urinary urgency 09/03/17: total vaginal hysterectomy with bilateral salpingectomy, uterosacral ligament suspension, Cysto, and midurethral mesh sling surgery   8/30/20219 [facility] OV Notes: HPI: She is here today for She is status post total vaginal hysterectomy with bilateral salpingectomy, uterosacral ligament suspension, Cysto, and midurethral mesh sling surgery on 09/03/2017. Patient states she is having abdominal pain, dyspareunia on the (r)side, and patient states she is unable to take OAB medication that was prescribed due to anxiety medication a Assessment /Plan: Brittney Ruiz is  a 58 y.o. with:  Diagnoses and all orders for this visit: Urge urinary incontinence - Practice bladder training and healthy bladder habits. Continue to avoid bladder irritants. Drink 48-60 ounces/day. Continue with pelvic floor physical therapy exercises. Take miralax  in the morning instead of immediately before bed. Perform 2-3 kegal squeezes prior to known triggers of urinary urgency.  - Consider Mirabegron in the future as she is concerned about worsened anxiety with anticholinergics  Vaginal atrophy - provided with list of nonhormonal moisturizers to use daily and with intercourse as estrogen  cream is cost-prohibitive for patient  Slow transit constipation - Continue with bowel regimen - Increase physical activity - Trial squatty potty or step stool to elevate knees above hips when evacuating bowels. Discussed that this will help relax the puborectalis, reduce straining, and improve evacuation of bowels.  At [facility] OV TODAY: She reports urinary urgency and inconsistence- first she estimated that has been occurring the last several weeks, but then shared she has been wearing depends briefs > 1 year ??? She denies fever, flank pain, abdominal pain, or hematuria  5. Vitamin D  deficiency Discussed Labs  Latest Reference Range & Units 11/15/23 15:06  Vitamin D , 25-Hydroxy 30.0 - 100.0 ng/mL 36.6   Vit D level low normal She is on OTC Calcium /Vit D combo She previously reacted to Ergocalciferol  and will not restart  Assessment/Plan:   1. Prediabetes (Primary) Limit simple CHO/sugar intake Increase lean protein intake  2. Dairy product intolerance Avoid known trigger foods Establish with Allergist  3. Mixed hyperlipidemia Limit sat fat Increase daily activity  4. Urinary urgency F/u with PCP and Uro/Gyn at [facility]   5. Vitamin D  deficiency Start OTC Vit D3 2,000 international units daily  6. BMI 38.0-38.9,adult, current BMI 36.1  Brittney Ruiz is currently in the action stage of change. As such, her goal is to continue with weight loss efforts. She has agreed to the Category 2 Plan.   Exercise goals: All adults should avoid inactivity. Some physical activity is better than none, and adults who participate in any amount of physical activity gain some health benefits. Adults should also include muscle-strengthening activities that involve all major muscle groups on 2 or more days a week.  Behavioral modification strategies: increasing lean protein intake, decreasing simple carbohydrates, increasing vegetables, increasing water intake, meal planning and cooking strategies, keeping  healthy foods in the home, ways to avoid boredom eating, and planning for success.  Brittney Ruiz has agreed to follow-up with our clinic in 4 weeks. She was informed of the importance of frequent follow-up visits to maximize her success with intensive lifestyle modifications for her multiple health conditions.   Objective:   Blood pressure 119/79, pulse 61, temperature 98.4 F (36.9 C), height 5' 2 (1.575 m), weight 197 lb (89.4 kg), SpO2 96%. Body mass index is 36.03 kg/m.  General: Cooperative, alert, well developed, in no acute distress. HEENT: Conjunctivae and lids unremarkable. Cardiovascular: Regular rhythm.  Lungs: Normal work of breathing. Neurologic: No focal deficits.   Lab Results  Component Value Date   CREATININE 0.77 11/15/2023   BUN 12 11/15/2023   NA 139 11/15/2023   K 4.1 11/15/2023   CL 101 11/15/2023   CO2 20 11/15/2023   Lab Results  Component Value Date   ALT 29 11/15/2023   AST 24 11/15/2023   ALKPHOS 83 11/15/2023   BILITOT 0.4 11/15/2023   Lab Results  Component Value Date   HGBA1C 5.5 11/15/2023   HGBA1C 5.6 02/04/2023   HGBA1C  5.7 (H) 07/30/2022   HGBA1C 5.5 02/02/2022   HGBA1C 5.3 06/09/2021   Lab Results  Component Value Date   INSULIN  12.1 11/15/2023   INSULIN  12.3 02/04/2023   INSULIN  7.9 07/30/2022   INSULIN  16.0 02/02/2022   INSULIN  8.2 06/09/2021   Lab Results  Component Value Date   TSH 1.830 02/04/2023   Lab Results  Component Value Date   CHOL 180 11/15/2023   HDL 35 (L) 11/15/2023   LDLCALC 116 (H) 11/15/2023   LDLDIRECT 141.0 05/02/2018   TRIG 164 (H) 11/15/2023   CHOLHDL 5.1 (H) 11/15/2023   Lab Results  Component Value Date   VD25OH 36.6 11/15/2023   VD25OH 46.4 02/04/2023   VD25OH 44.0 07/30/2022   Lab Results  Component Value Date   WBC 7.6 06/17/2023   HGB 15.5 (H) 06/17/2023   HCT 45.6 06/17/2023   MCV 86.2 06/17/2023   PLT 155 06/17/2023   Lab Results  Component Value Date   IRON 77 04/21/2021   TIBC  283 04/21/2021   FERRITIN 336 (H) 04/21/2021   Attestation Statements:   Reviewed by clinician on day of visit: allergies, medications, problem list, medical history, surgical history, family history, social history, and previous encounter notes.  I have reviewed the above documentation for accuracy and completeness, and I agree with the above. -  Mckena Chern d. Sultana Tierney, NP-C

## 2023-12-15 ENCOUNTER — Telehealth (INDEPENDENT_AMBULATORY_CARE_PROVIDER_SITE_OTHER): Admitting: Family

## 2023-12-15 VITALS — Ht 62.0 in | Wt 197.0 lb

## 2023-12-15 DIAGNOSIS — R399 Unspecified symptoms and signs involving the genitourinary system: Secondary | ICD-10-CM

## 2023-12-15 DIAGNOSIS — G4733 Obstructive sleep apnea (adult) (pediatric): Secondary | ICD-10-CM | POA: Diagnosis not present

## 2023-12-15 DIAGNOSIS — R3915 Urgency of urination: Secondary | ICD-10-CM

## 2023-12-15 NOTE — Progress Notes (Signed)
 Virtual Visit via Video Note  I connected with Brittney Ruiz on 12/20/23 at 10:30 AM EDT by a video enabled telemedicine application and verified that I am speaking with the correct person using two identifiers. Location patient: home Location provider: work  Persons participating in the virtual visit: patient, provider  I discussed the limitations of evaluation and management by telemedicine and the availability of in person appointments. The patient expressed understanding and agreed to proceed.  HPI: Follow up for approval for Zepbound   She would like to start this medication for prediabetes , HTN, OSA, TIA, insulin  resistance, elevated LP(a)  Zepbound  PA has been going to [facility]; in needs to go to [place] .   Prior authorization for [employer] [phone]   She complains of urinary odor, urgency , urinary frequency over the last days  She wears a urinary incontinence pad.   Denies fever, chills, hematuria.   ROS: See pertinent positives and negatives per HPI.  EXAM:  VITALS per patient if applicable: Ht 5' 2 (1.575 m)   Wt 197 lb (89.4 kg)   LMP  (LMP Unknown)   BMI 36.03 kg/m  BP Readings from Last 3 Encounters:  12/14/23 119/79  11/15/23 125/84  09/29/23 124/84   Wt Readings from Last 3 Encounters:  12/15/23 197 lb (89.4 kg)  12/14/23 197 lb (89.4 kg)  11/15/23 200 lb (90.7 kg)    GENERAL: alert, oriented, appears well and in no acute distress  HEENT: atraumatic, conjunttiva clear, no obvious abnormalities on inspection of external nose and ears  NECK: normal movements of the head and neck  LUNGS: on inspection no signs of respiratory distress, breathing rate appears normal, no obvious gross SOB, gasping or wheezing  CV: no obvious cyanosis  MS: moves all visible extremities without noticeable abnormality  PSYCH/NEURO: pleasant and cooperative, no obvious depression or anxiety, speech and thought processing grossly intact  ASSESSMENT AND PLAN: UTI  symptoms -     Urinalysis, Routine w reflex microscopic; Future -     Urine Culture; Future  Urinary urgency Assessment & Plan: Pending urinary studies.    OSA (obstructive sleep apnea) Assessment & Plan: Discussed Zepbound  as being appropriate for sleep apnea.  She also has a history of prediabetes, hypertension, insulin  resistance, elevated lipoprotein (a).  Prior authorization has been faxed      -we discussed possible serious and likely etiologies, options for evaluation and workup, limitations of telemedicine visit vs in person visit, treatment, treatment risks and precautions. Pt prefers to treat via telemedicine empirically rather then risking or undertaking an in person visit at this moment.    I discussed the assessment and treatment plan with the patient. The patient was provided an opportunity to ask questions and all were answered. The patient agreed with the plan and demonstrated an understanding of the instructions.   The patient was advised to call back or seek an in-person evaluation if the symptoms worsen or if the condition fails to improve as anticipated.  Advised if desired AVS can be mailed or viewed via MyChart if Mychart user.   Rollene Northern, FNP

## 2023-12-16 ENCOUNTER — Other Ambulatory Visit

## 2023-12-16 DIAGNOSIS — R399 Unspecified symptoms and signs involving the genitourinary system: Secondary | ICD-10-CM

## 2023-12-16 NOTE — Telephone Encounter (Signed)
 Good morning Taiesha, it is in your media tab, let me try and send in my chart as a letter to you, gimme a sec

## 2023-12-17 LAB — URINE CULTURE
MICRO NUMBER:: 16770621
Result:: NO GROWTH
SPECIMEN QUALITY:: ADEQUATE

## 2023-12-17 LAB — URINALYSIS, ROUTINE W REFLEX MICROSCOPIC
Bilirubin Urine: NEGATIVE
Glucose, UA: NEGATIVE
Hgb urine dipstick: NEGATIVE
Ketones, ur: NEGATIVE
Leukocytes,Ua: NEGATIVE
Nitrite: NEGATIVE
Protein, ur: NEGATIVE
Specific Gravity, Urine: 1.007 (ref 1.001–1.035)
pH: 6.5 (ref 5.0–8.0)

## 2023-12-20 ENCOUNTER — Ambulatory Visit: Payer: Self-pay | Admitting: Family

## 2023-12-20 NOTE — Assessment & Plan Note (Signed)
 Pending urinary studies.

## 2023-12-20 NOTE — Assessment & Plan Note (Signed)
 Discussed Zepbound  as being appropriate for sleep apnea.  She also has a history of prediabetes, hypertension, insulin  resistance, elevated lipoprotein (a).  Prior authorization has been faxed

## 2023-12-21 DIAGNOSIS — E669 Obesity, unspecified: Secondary | ICD-10-CM | POA: Diagnosis not present

## 2023-12-21 DIAGNOSIS — E7849 Other hyperlipidemia: Secondary | ICD-10-CM | POA: Diagnosis not present

## 2023-12-22 ENCOUNTER — Telehealth: Payer: Self-pay

## 2023-12-22 NOTE — Telephone Encounter (Signed)
 LVM to call back in regards to Zepbound , appeal was denied again. Please relay and document

## 2023-12-27 ENCOUNTER — Telehealth (INDEPENDENT_AMBULATORY_CARE_PROVIDER_SITE_OTHER): Admitting: Psychology

## 2023-12-27 ENCOUNTER — Telehealth (INDEPENDENT_AMBULATORY_CARE_PROVIDER_SITE_OTHER): Payer: Self-pay | Admitting: Physician Assistant

## 2023-12-27 DIAGNOSIS — F419 Anxiety disorder, unspecified: Secondary | ICD-10-CM | POA: Diagnosis not present

## 2023-12-27 DIAGNOSIS — F32A Depression, unspecified: Secondary | ICD-10-CM

## 2023-12-27 DIAGNOSIS — M47816 Spondylosis without myelopathy or radiculopathy, lumbar region: Secondary | ICD-10-CM | POA: Diagnosis not present

## 2023-12-27 DIAGNOSIS — F5089 Other specified eating disorder: Secondary | ICD-10-CM

## 2023-12-27 DIAGNOSIS — M5416 Radiculopathy, lumbar region: Secondary | ICD-10-CM | POA: Diagnosis not present

## 2023-12-27 NOTE — Progress Notes (Signed)
  Office: [phone]  /  Fax: [phone]    Date: December 27, 2023  Appointment Start Time: 2:34pm Duration: 30 minutes Provider: Wyatt Fire, Psy.D. Type of Session: Individual Therapy  Location of Patient: Home (private location) Location of Provider: Provider's Home (private office) Type of Contact: Telepsychological Visit via MyChart Video Visit  Session Content:This provider called [name] at 2:34pm as she did not present for today's appointment. She stated she was in the process of logging in. Brittney Ruiz was observed joining shortly after. As such, today's appointment was initiated 4 minutes late. Brittney Ruiz is a 58 y.o. female presenting for a follow-up appointment to address the previously established treatment goal of increasing coping skills. Today's appointment was a telepsychological visit. Rick provided verbal consent for today's telepsychological appointment and she is aware she is responsible for securing confidentiality on her end of the session. Prior to proceeding with today's appointment, Brittney Ruiz's physical location at the time of this appointment was obtained as well a phone number she could be reached at in the event of technical difficulties. Brittney Ruiz and this provider participated in today's telepsychological service.   This provider conducted a brief check-in. Brittney Ruiz reported ongoing issues with insurance, noting she received four denials from Medicare and lack of coverage for Mounjaro  and Zepbound . She further shared about her recent OV with Brittney Dalton, NP. Notably, Brittney Ruiz stated she lost weight and is eating more since setting reminders to eat. Psychoeducation provided regarding self-compassion, including its impact on sustainability of lifestyle change. Overall, Brittney Ruiz was receptive to today's appointment as evidenced by openness to sharing, responsiveness to feedback, and willingness to work toward increasing self-compassion.  Mental Status Examination:  Appearance: neat Behavior:  appropriate to circumstances Mood: irritable and depressed Affect: mood congruent; tearful at times Speech: WNL Eye Contact: appropriate Psychomotor Activity: WNL Gait: unable to assess Thought Process: linear, logical, and goal directed and denies suicidal, homicidal, and self-harm ideation, plan and intent since the last appointment with this provider  Thought Content/Perception: no hallucinations, delusions, bizarre thinking or behavior endorsed or observed Orientation: AAOx4 Memory/Concentration: intact Insight: fair Judgment: fair  Interventions:  Conducted a brief chart review Conducted a risk assessment Provided empathic reflections and validation Provided positive reinforcement Employed supportive psychotherapy interventions to facilitate reduced distress and to improve coping skills with identified stressors Psychoeducation provided regarding self-compassion  DSM-5 Diagnosis(es): F50.89 Other Specified Feeding or Eating Disorder, Emotional Eating Behaviors, F41.9 Unspecified Anxiety Disorder, and  F32.A Unspecified Depressive Disorder  Treatment Goal & Progress: During the initial appointment with this provider, the following treatment goal was established: increase coping skills. Brittney Ruiz has demonstrated progress in her goal as evidenced by increased awareness of hunger patterns and increased awareness of triggers for emotional eating behaviors. Brittney Ruiz also continues to demonstrate willingness to engage in learned skill(s).   Plan: The next appointment is scheduled for 08/11/2023 at 2pm, which will be via MyChart Video Visit. The next session will focus on working towards the established treatment goal. She will continue with [facility].    Wyatt Fire, PsyD

## 2023-12-27 NOTE — Telephone Encounter (Signed)
 Good afternoon!  Patient states that needs a letter stating that she has been attending the program for 3+ years, the medications that were not successful and that she needs the medication for other comorbidities. The letter is for approval for zepbound  to be addressed to Medicare Hearing and Appeals. This is all the information that she gave me.   She said that medicare may not approve anything that states weight loss or management.   She said the letter can come through MyChart.

## 2023-12-28 NOTE — Telephone Encounter (Signed)
 Called pt and LVM to give me a call back for info for her appeal.  We can provide patient with a letter stating she is a client of [facility]. We do not have any documents of any other GLP-1 that was tried and failed with [facility]. Patient PCP has been ordering patient Zepbound  prescription.   Will give the patient a call back soon.

## 2023-12-29 DIAGNOSIS — F411 Generalized anxiety disorder: Secondary | ICD-10-CM | POA: Diagnosis not present

## 2023-12-29 DIAGNOSIS — F321 Major depressive disorder, single episode, moderate: Secondary | ICD-10-CM | POA: Diagnosis not present

## 2023-12-30 NOTE — Telephone Encounter (Signed)
 Called pt and LVM that we have her letter ready.

## 2024-01-04 ENCOUNTER — Telehealth (INDEPENDENT_AMBULATORY_CARE_PROVIDER_SITE_OTHER): Payer: Self-pay

## 2024-01-04 NOTE — Telephone Encounter (Signed)
 Spoke w/ pt 3:22 on 01/04/24. Pt states that she needs a letter with all diagnoses for court appeals. She has already requested letter and sent messages with info to send letter. She had spoken with [facility] and per notes, letter is ready. Unable to locate letter and reached out to CMA that was working at another office. No response at this time. Pt is upset as this was supposedly taken care of and she needs it asap as it needs to be at appeals court next week.

## 2024-01-05 ENCOUNTER — Encounter (INDEPENDENT_AMBULATORY_CARE_PROVIDER_SITE_OTHER): Payer: Self-pay

## 2024-01-06 NOTE — Telephone Encounter (Signed)
 Letter sent email provided and was attached to message for pt to print. Brittney Ruiz spoke w/ pt

## 2024-01-10 DIAGNOSIS — M5412 Radiculopathy, cervical region: Secondary | ICD-10-CM | POA: Diagnosis not present

## 2024-01-10 DIAGNOSIS — M47816 Spondylosis without myelopathy or radiculopathy, lumbar region: Secondary | ICD-10-CM | POA: Diagnosis not present

## 2024-01-10 DIAGNOSIS — M5380 Other specified dorsopathies, site unspecified: Secondary | ICD-10-CM | POA: Diagnosis not present

## 2024-01-10 DIAGNOSIS — M5416 Radiculopathy, lumbar region: Secondary | ICD-10-CM | POA: Diagnosis not present

## 2024-01-11 ENCOUNTER — Ambulatory Visit (INDEPENDENT_AMBULATORY_CARE_PROVIDER_SITE_OTHER): Admitting: Physician Assistant

## 2024-01-11 ENCOUNTER — Telehealth (INDEPENDENT_AMBULATORY_CARE_PROVIDER_SITE_OTHER): Admitting: Psychology

## 2024-01-11 DIAGNOSIS — F5089 Other specified eating disorder: Secondary | ICD-10-CM

## 2024-01-11 DIAGNOSIS — F32A Depression, unspecified: Secondary | ICD-10-CM | POA: Diagnosis not present

## 2024-01-11 DIAGNOSIS — F419 Anxiety disorder, unspecified: Secondary | ICD-10-CM | POA: Diagnosis not present

## 2024-01-11 NOTE — Progress Notes (Signed)
 Office: [phone]  /  Fax: [phone]    Date: January 11, 2024  Appointment Start Time: 2:05pm Duration: 31 minutes Provider: Wyatt Fire, Psy.D. Type of Session: Individual Therapy  Location of Patient: Home (private location) Location of Provider: Provider's Home (private office) Type of Contact: Telepsychological Visit via MyChart Video Visit  Session Content: This provider called Brittney Ruiz at 2:04pm as she did not present for today's appointment. A HIPAA compliant voicemail was left requesting a call back.  Brittney Ruiz was observed joining shortly after. As such, today's appointment was initiated 5 minutes late. Brittney Ruiz is a 58 y.o. female presenting for a follow-up appointment to address the previously established treatment goal of increasing coping skills. Today's appointment was a telepsychological visit. Brittney Ruiz provided verbal consent for today's telepsychological appointment and she is aware she is responsible for securing confidentiality on her end of the session. Prior to proceeding with today's appointment, Brittney Ruiz's physical location at the time of this appointment was obtained as well a phone number she could be reached at in the event of technical difficulties. Brittney Ruiz and this provider participated in today's telepsychological service.   This provider conducted a brief check-in. Brittney Ruiz reported ongoing stressors secondary to house problems requiring various repairs, marital conflict, her mother's estate planning, and appeal process for a rx. Despite ongoing stressors, Brittney Ruiz indicated she went to a new church, noting They were very nice people. Regarding eating habits, Brittney Ruiz noted an improvement in appetite but also an increase in cravings for sweets. Notably, she shared she will make better choices and engage in portion control. Discussed self-compassion further. She acknowledged, I haven't done a lot of that. Further explored and processed. Engaged Brittney Ruiz in a self-compassion exercise (i.e.,  self-compassion break). She was encouraged to engage in today's exercise regularly to increase self-compassion. Also, discussed the impact of lack of self-compassion on disordered eating behaviors and sustainability of a lifestyle change. Overall, Brittney Ruiz was receptive to today's appointment as evidenced by openness to sharing, responsiveness to feedback, and willingness to work toward increasing self-compassion.  Mental Status Examination:  Appearance: neat Behavior: appropriate to circumstances Mood: depressed Affect: mood congruent; tearful when engaging in self-compassion exercise  Speech: WNL Eye Contact: appropriate Psychomotor Activity: WNL Gait: unable to assess Thought Process: linear, logical, and goal directed and denies suicidal, homicidal, and self-harm ideation, plan and intent  Thought Content/Perception: no hallucinations, delusions, bizarre thinking or behavior endorsed or observed Orientation: AAOx4 Memory/Concentration: intact Insight: fair Judgment: fair  Interventions:  Conducted a brief chart review Conducted a risk assessment Provided empathic reflections and validation Reviewed content from the previous session Provided positive reinforcement Employed supportive psychotherapy interventions to facilitate reduced distress and to improve coping skills with identified stressors Engaged pt in a self-compassion exercise  DSM-5 Diagnosis(es): F50.89 Other Specified Feeding or Eating Disorder, Emotional Eating Behaviors, F41.9 Unspecified Anxiety Disorder, and  F32.A Unspecified Depressive Disorder  Treatment Goal & Progress:  During the initial appointment with this provider, the following treatment goal was established: increase coping skills. Brittney Ruiz has demonstrated progress in her goal as evidenced by increased awareness of hunger patterns and increased awareness of triggers for emotional eating behaviors. Brittney Ruiz also continues to demonstrate willingness to engage in learned  skill(s).   Plan: The next appointment is scheduled for 02/01/2024 at 2:30pm, which will be via MyChart Video Visit. The next session will focus on working towards the established treatment goal. Of note, Brittney Ruiz has a primary therapist with [facility] and will continue with her provider.    [name]  [name], PsyD

## 2024-01-12 DIAGNOSIS — R260 Ataxic gait: Secondary | ICD-10-CM | POA: Diagnosis not present

## 2024-01-12 DIAGNOSIS — R42 Dizziness and giddiness: Secondary | ICD-10-CM | POA: Diagnosis not present

## 2024-01-13 DIAGNOSIS — F321 Major depressive disorder, single episode, moderate: Secondary | ICD-10-CM | POA: Diagnosis not present

## 2024-01-13 DIAGNOSIS — F411 Generalized anxiety disorder: Secondary | ICD-10-CM | POA: Diagnosis not present

## 2024-01-24 ENCOUNTER — Ambulatory Visit (INDEPENDENT_AMBULATORY_CARE_PROVIDER_SITE_OTHER): Admitting: Adult Health

## 2024-01-26 DIAGNOSIS — J45909 Unspecified asthma, uncomplicated: Secondary | ICD-10-CM | POA: Diagnosis not present

## 2024-01-26 DIAGNOSIS — Z1331 Encounter for screening for depression: Secondary | ICD-10-CM | POA: Diagnosis not present

## 2024-01-26 DIAGNOSIS — F419 Anxiety disorder, unspecified: Secondary | ICD-10-CM | POA: Diagnosis not present

## 2024-01-26 DIAGNOSIS — F32A Depression, unspecified: Secondary | ICD-10-CM | POA: Diagnosis not present

## 2024-01-26 DIAGNOSIS — R635 Abnormal weight gain: Secondary | ICD-10-CM | POA: Diagnosis not present

## 2024-01-26 DIAGNOSIS — E785 Hyperlipidemia, unspecified: Secondary | ICD-10-CM | POA: Diagnosis not present

## 2024-01-26 DIAGNOSIS — K219 Gastro-esophageal reflux disease without esophagitis: Secondary | ICD-10-CM | POA: Diagnosis not present

## 2024-01-26 DIAGNOSIS — I10 Essential (primary) hypertension: Secondary | ICD-10-CM | POA: Diagnosis not present

## 2024-01-26 DIAGNOSIS — K7581 Nonalcoholic steatohepatitis (NASH): Secondary | ICD-10-CM | POA: Diagnosis not present

## 2024-01-26 DIAGNOSIS — G43909 Migraine, unspecified, not intractable, without status migrainosus: Secondary | ICD-10-CM | POA: Diagnosis not present

## 2024-01-26 DIAGNOSIS — E66812 Obesity, class 2: Secondary | ICD-10-CM | POA: Diagnosis not present

## 2024-01-31 DIAGNOSIS — K9049 Malabsorption due to intolerance, not elsewhere classified: Secondary | ICD-10-CM | POA: Diagnosis not present

## 2024-01-31 DIAGNOSIS — K760 Fatty (change of) liver, not elsewhere classified: Secondary | ICD-10-CM | POA: Diagnosis not present

## 2024-02-01 ENCOUNTER — Telehealth (INDEPENDENT_AMBULATORY_CARE_PROVIDER_SITE_OTHER): Admitting: Psychology

## 2024-02-01 DIAGNOSIS — F419 Anxiety disorder, unspecified: Secondary | ICD-10-CM | POA: Diagnosis not present

## 2024-02-01 DIAGNOSIS — F5089 Other specified eating disorder: Secondary | ICD-10-CM

## 2024-02-01 DIAGNOSIS — F32A Depression, unspecified: Secondary | ICD-10-CM | POA: Diagnosis not present

## 2024-02-01 NOTE — Progress Notes (Signed)
  Office: [phone]  /  Fax: [phone]    Date: February 01, 2024  Appointment Start Time: 2:32pm Duration: 32 minutes Provider: Wyatt Fire, Psy.D. Type of Session: Individual Therapy  Location of Patient: Home (private location) Location of Provider: Provider's Home (private office) Type of Contact: Telepsychological Visit via MyChart Video Visit  Session Content: Brittney Ruiz is a 58 y.o. female presenting for a follow-up appointment to address the previously established treatment goal of increasing coping skills. Today's appointment was a telepsychological visit. Brittney Ruiz provided verbal consent for today's telepsychological appointment and she is aware she is responsible for securing confidentiality on her end of the session. Prior to proceeding with today's appointment, Brittney Ruiz's physical location at the time of this appointment was obtained as well a phone number she could be reached at in the event of technical difficulties. Brittney Ruiz and this provider participated in today's telepsychological service.   This provider conducted a brief check-in. Brittney Ruiz reported she had an appointment with her GI doctor yesterday due to food intolerances, noting labs were completed and the doctor wants [her] to eat a different way. Further explored. She explained there is reportedly concern of bacterial overgrowth in her small intestine and she will start an antibiotic if labs confirm the aforementioned. Remainder of today's appointment focused further on self-compassion, as Brittney Ruiz continues to report ongoing stressors as well as pain. This provider also shared referral options for providers who specialize in treatment of pain/medical concerns. Overall, Brittney Ruiz was receptive to today's appointment as evidenced by openness to sharing and responsiveness to feedback.  Mental Status Examination:  Appearance: neat Behavior: appropriate to circumstances Mood: neutral Affect: mood congruent Speech: WNL Eye Contact:  appropriate Psychomotor Activity: WNL Gait: unable to assess Thought Process: linear, logical, and goal directed and denies suicidal, homicidal, and self-harm ideation, plan and intent since the last appointment with this provider Thought Content/Perception: no hallucinations, delusions, bizarre thinking or behavior endorsed or observed Orientation: AAOx4 Memory/Concentration: intact Insight: fair Judgment: fair  Interventions:  Conducted a brief chart review Conducted a risk assessment Provided empathic reflections and validation Reviewed content from the previous session Provided positive reinforcement Employed supportive psychotherapy interventions to facilitate reduced distress and to improve coping skills with identified stressors Recommended/discussed options for longer-term therapeutic services  DSM-5 Diagnosis(es): F50.89 Other Specified Feeding or Eating Disorder, Emotional Eating Behaviors, F41.9 Unspecified Anxiety Disorder, and  F32.A Unspecified Depressive Disorder  Treatment Goal & Progress: During the initial appointment with this provider, the following treatment goal was established: increase coping skills. Brittney Ruiz has demonstrated progress in her goal as evidenced by increased awareness of hunger patterns and increased awareness of triggers for emotional eating behaviors. Brittney Ruiz also continues to demonstrate willingness to engage in learned skill(s).   Plan: The next appointment is scheduled for 02/15/2024 at 11:30am, which will be via MyChart Video Visit. The next session will focus on working towards the established treatment goal. Brittney Ruiz will continue with her behavioral health provider with [facility].   Wyatt Fire, PsyD

## 2024-02-03 ENCOUNTER — Ambulatory Visit (INDEPENDENT_AMBULATORY_CARE_PROVIDER_SITE_OTHER): Admitting: Family

## 2024-02-03 ENCOUNTER — Encounter: Payer: Self-pay | Admitting: Family

## 2024-02-03 ENCOUNTER — Ambulatory Visit (INDEPENDENT_AMBULATORY_CARE_PROVIDER_SITE_OTHER)

## 2024-02-03 ENCOUNTER — Telehealth: Payer: Self-pay

## 2024-02-03 VITALS — BP 122/78 | HR 60 | Temp 97.8°F | Ht 62.0 in | Wt 202.0 lb

## 2024-02-03 DIAGNOSIS — R109 Unspecified abdominal pain: Secondary | ICD-10-CM

## 2024-02-03 DIAGNOSIS — M4316 Spondylolisthesis, lumbar region: Secondary | ICD-10-CM | POA: Diagnosis not present

## 2024-02-03 DIAGNOSIS — M5134 Other intervertebral disc degeneration, thoracic region: Secondary | ICD-10-CM | POA: Diagnosis not present

## 2024-02-03 DIAGNOSIS — M47816 Spondylosis without myelopathy or radiculopathy, lumbar region: Secondary | ICD-10-CM | POA: Diagnosis not present

## 2024-02-03 DIAGNOSIS — Z23 Encounter for immunization: Secondary | ICD-10-CM | POA: Diagnosis not present

## 2024-02-03 DIAGNOSIS — R7303 Prediabetes: Secondary | ICD-10-CM | POA: Diagnosis not present

## 2024-02-03 DIAGNOSIS — M545 Low back pain, unspecified: Secondary | ICD-10-CM | POA: Diagnosis not present

## 2024-02-03 DIAGNOSIS — E88819 Insulin resistance, unspecified: Secondary | ICD-10-CM

## 2024-02-03 DIAGNOSIS — G4733 Obstructive sleep apnea (adult) (pediatric): Secondary | ICD-10-CM

## 2024-02-03 DIAGNOSIS — R300 Dysuria: Secondary | ICD-10-CM

## 2024-02-03 DIAGNOSIS — M47814 Spondylosis without myelopathy or radiculopathy, thoracic region: Secondary | ICD-10-CM | POA: Diagnosis not present

## 2024-02-03 DIAGNOSIS — K760 Fatty (change of) liver, not elsewhere classified: Secondary | ICD-10-CM | POA: Diagnosis not present

## 2024-02-03 DIAGNOSIS — M5136 Other intervertebral disc degeneration, lumbar region with discogenic back pain only: Secondary | ICD-10-CM | POA: Diagnosis not present

## 2024-02-03 DIAGNOSIS — M48061 Spinal stenosis, lumbar region without neurogenic claudication: Secondary | ICD-10-CM | POA: Diagnosis not present

## 2024-02-03 LAB — POCT URINALYSIS DIPSTICK
Bilirubin, UA: NEGATIVE
Blood, UA: NEGATIVE
Glucose, UA: NEGATIVE
Ketones, UA: NEGATIVE
Leukocytes, UA: NEGATIVE
Nitrite, UA: NEGATIVE
Protein, UA: NEGATIVE
Spec Grav, UA: 1.01 (ref 1.010–1.025)
Urobilinogen, UA: 0.2 U/dL
pH, UA: 7 (ref 5.0–8.0)

## 2024-02-03 MED ORDER — ZEPBOUND 2.5 MG/0.5ML ~~LOC~~ SOAJ: 2.5000 mg | SUBCUTANEOUS | 2 refills | Status: DC

## 2024-02-03 NOTE — Progress Notes (Signed)
 Assessment & Plan:  Right flank pain -     DG Lumbar Spine Complete; Future -     DG Thoracic Spine W/Swimmers; Future -     DG Abd 1 View; Future  OSA (obstructive sleep apnea) Assessment & Plan: Letter and appeal for Zepbound  provided today to insurer.  Zepbound  2.5 mg prescribed.    Orders: -     Zepbound ; Inject 2.5 mg into the skin once a week.  Dispense: 2 mL; Refill: 2  Hepatic steatosis -     Zepbound ; Inject 2.5 mg into the skin once a week.  Dispense: 2 mL; Refill: 2  Insulin  resistance -     Zepbound ; Inject 2.5 mg into the skin once a week.  Dispense: 2 mL; Refill: 2  Prediabetes -     Zepbound ; Inject 2.5 mg into the skin once a week.  Dispense: 2 mL; Refill: 2  Dysuria -     Urinalysis, Routine w reflex microscopic; Future -     Urine Culture -     POCT urinalysis dipstick  Need for influenza vaccination -     Flu vaccine trivalent PF, 6mos and older(Flulaval,Afluria ,Fluarix,Fluzone)  Left-sided low back pain without sciatica, unspecified chronicity Assessment & Plan: Acute on chronic, left-sided back pain.  Differential includes degenerative disc disease, renal stone.  Pending urinalysis, urine culture, abdominal, thoracic and lumbar x-rays.  Consider physical therapy, massage (if covered by insurance) once images result.  Continue heat.      Return precautions given.   Risks, benefits, and alternatives of the medications and treatment plan prescribed today were discussed, and patient expressed understanding.   Education regarding symptom management and diagnosis given to patient on AVS either electronically or printed.  No follow-ups on file.  Rollene Northern, FNP  Subjective:    Patient ID: Slater JULIANNA Dawn, female    DOB: Oct 15, 1965, 58 y.o.   MRN: [id number]  CC: SHIZUYE RUPERT is a 58 y.o. female who presents today for an acute visit.    HPI: Complains of episodic left low back pain 14 days.  She describes as a sSmall ache'   She uses a  'stepper' for lower legs , 10 min/ day, pain will become exquisite. Worse bending over a 'certain way.' Heat with some relief.   TENS unit which makes it worse.   Endorses urinary frequency, vaginal odor.  Denies f, chills, dysuria, urinary incontinence, fecal incontience, hematuria, numbness or weakness in legs.    Previously massage was very helpful however insurance stopped paying for therapy.    Cervical spine 03/2023 Mild-to-moderate multilevel degenerative changes of the cervical spine, as described above MRI lumbar spine 08/19/2022, [facility]. 1. Mild degenerative disc disease of the lumbar spine. No foraminal or canal stenosis at any level. 2. Moderate-advanced bilateral facet arthropathy at L4-5 with associated grade 1 anterolisthesis of L4 on L5.  Consult with [facility] 02/01/2024 Consult with [facility] 01/26/2024 Dr Bobie for [facility], Patient is very hesitant to start [facility] over Zepbound  given that she has medication intolerances. I discussed with her that Georjean is FDA approved for treatment of fatty liver disease which she has been told that she has in the past. Her fib 4 score is elevated today so we will go ahead and refer for FibroScan/hepatology referral.  Allergies: Codeine, Elemental sulfur, Pork allergy, Pork-derived products, Aciphex [rabeprazole sodium], Antivert [meclizine hcl], Buspar [buspirone hcl], Cat dander, Chlorpheniramine-pseudoeph, Chlorpheniramine-pseudoeph, Chocolate flavoring agent (non-screening),  Crestor [rosuvastatin calcium ], Cymbalta [duloxetine hcl], Duloxetine, Effexor xr [venlafaxine hydrochloride], Epinephrine , Erythromycin, Escitalopram oxalate, Lamictal [lamotrigine], Levofloxacin , Lipitor [atorvastatin ], Meclizine, Mometasone furoate, Mometasone furoate, Monosodium glutamate, Neurontin [gabapentin], Other, Prednisone, Red yeast rice extract, Rosuvastatin, Shellfish allergy,  Sudafed [pseudoephedrine hcl], Sulfa antibiotics, Sulfasalazine, Valium , Wellbutrin [bupropion hcl], Wixela inhub [fluticasone -salmeterol], Xanax  xr [alprazolam ], Zetia  [ezetimibe ], Alfalfa, Brompheniramine-phenylephrine, Buspirone, and Tape Current Outpatient Medications on File Prior to Visit  Medication Sig Dispense Refill   acetaminophen  (TYLENOL ) 500 MG tablet Take 500 mg by mouth every 6 (six) hours as needed.     albuterol  (VENTOLIN  HFA) 108 (90 Base) MCG/ACT inhaler USE 2 INHALATIONS ORALLY   EVERY 6 HOURS AS NEEDED. 18 g 1   Alcohol  Swabs (B-D SINGLE USE SWABS REGULAR) PADS USE 1 SWAB TOPICALLY TO    CLEAN FINGER BEFORE        CHECKING GLUCOSE 300 each 1   aspirin EC 81 MG tablet Take 81 mg by mouth.     B Complex Vitamins (BL VITAMIN B COMPLEX PO) +Folic acid  qd.     Blood Glucose Monitoring Suppl (TRUE METRIX METER) w/Device KIT USED TO CHECK GLUCOSE READINGS  ONE TIME DAILY AS NEEDED. 1 kit 0   Calcium  Carb-Cholecalciferol 600-10 MG-MCG CAPS Take by mouth.     clorazepate (TRANXENE) 7.5 MG tablet Take 7.5 mg by mouth 3 (three) times daily.     clotrimazole  (LOTRIMIN ) 1 % cream Apply 1 Application topically 2 (two) times daily. 30 g 1   EPINEPHrine  0.3 mg/0.3 mL IJ SOAJ injection Inject 0.3 mg into the muscle as needed for anaphylaxis. 2 each 1   fexofenadine  (ALLEGRA ) 60 MG tablet Take 1 tablet (60 mg total) by mouth daily. (Patient taking differently: Take 180 mg by mouth daily.) 90 tablet 1   fluticasone  (FLONASE ) 50 MCG/ACT nasal spray USE 1 SPRAY IN EACH NOSTRILAT BEDTIME 16 g 3   glucose blood (TRUE METRIX BLOOD GLUCOSE TEST) test strip TEST BLOOD SUGAR ONE TIME DAILY 100 strip 0   ibuprofen  (ADVIL ) 100 MG/5ML suspension Take 200 mg by mouth every 4 (four) hours as needed. Takes 200-300 mg daily as needed     Multiple Vitamin (MULTIVITAMIN) tablet Take 1 tablet by mouth daily.     Omega-3 Fatty Acids (FISH OIL) 1000 MG CAPS Take 2 capsules by mouth daily. Take 2 capsules daily  which is 1200 mg per serving     omeprazole  (PRILOSEC) 20 MG capsule TAKE 1 CAPSULE DAILY 90 capsule 2   ondansetron  (ZOFRAN -ODT) 4 MG disintegrating tablet Take 2 mg by mouth every 6 (six) hours as needed.     polyethylene glycol powder (GLYCOLAX /MIRALAX ) 17 GM/SCOOP powder Take 1 Container by mouth at bedtime.     propranolol  (INDERAL ) 20 MG tablet Take 1 tablet (20 mg total) by mouth 3 (three) times daily. 90 tablet 3   Pseudoephedrine-DM (CHILDRENS DIMETAPP PLUS PO) Take by mouth. As needed     psyllium (METAMUCIL) 58.6 % powder Take 1 packet by mouth daily.     sertraline  (ZOLOFT ) 100 MG tablet TAKE 1+1/2 TABLETS BY MOUTH DAILY AS DIRECTED (Patient taking differently: Take 112.5 mg by mouth See admin instructions. 112.5 in the morning and 25 mg in the evening) 45 tablet 3   TRUEplus Lancets 33G MISC TEST BLOOD SUGAR ONE TIME DAILY 100 each 0   No current facility-administered medications on file prior to visit.    Review of Systems  Constitutional:  Negative for chills and fever.  Respiratory:  Negative for cough.  Cardiovascular:  Negative for chest pain and palpitations.  Gastrointestinal:  Negative for constipation, nausea and vomiting.  Genitourinary:  Positive for frequency. Negative for difficulty urinating, hematuria and vaginal discharge.  Musculoskeletal:  Positive for back pain.  Neurological:  Negative for numbness.      Objective:    BP 122/78   Pulse 60   Temp 97.8 F (36.6 C) (Oral)   Ht 5' 2 (1.575 m)   Wt 202 lb (91.6 kg)   LMP  (LMP Unknown)   SpO2 98%   BMI 36.95 kg/m   BP Readings from Last 3 Encounters:  02/03/24 122/78  12/14/23 119/79  11/15/23 125/84   Wt Readings from Last 3 Encounters:  02/03/24 202 lb (91.6 kg)  12/15/23 197 lb (89.4 kg)  12/14/23 197 lb (89.4 kg)    Physical Exam Vitals reviewed.  Constitutional:      Appearance: She is well-developed.  Eyes:     Conjunctiva/sclera: Conjunctivae normal.  Cardiovascular:     Rate  and Rhythm: Normal rate and regular rhythm.     Pulses: Normal pulses.     Heart sounds: Normal heart sounds.  Pulmonary:     Effort: Pulmonary effort is normal.     Breath sounds: Normal breath sounds. No wheezing, rhonchi or rales.  Abdominal:     Tenderness: There is no right CVA tenderness or left CVA tenderness.  Musculoskeletal:     Lumbar back: Tenderness (left mid back) present. No swelling, edema, spasms or bony tenderness. Normal range of motion. Negative right straight leg raise test and negative left straight leg raise test.       Back:     Comments: Non exquisite tenderness left mid back.  No bony step-off, erythema, rash, gross abnormality.    Full range of motion with flexion, tension, lateral side bends.  No pain, numbness, tingling elicited with single leg raise bilaterally.   Skin:    General: Skin is warm and dry.  Neurological:     Mental Status: She is alert.     Sensory: No sensory deficit.     Deep Tendon Reflexes:     Reflex Scores:      Patellar reflexes are 2+ on the right side and 2+ on the left side.    Comments: Sensation and strength intact bilateral lower extremities.  Psychiatric:        Speech: Speech normal.        Behavior: Behavior normal.        Thought Content: Thought content normal.

## 2024-02-03 NOTE — Telephone Encounter (Signed)
 Copied from CRM [id number]. Topic: General - Other >> Feb 03, 2024 10:05 AM Brittney Ruiz wrote: Pt requesting a call from NP Rollene in regards to her Zepbound  appeal as the fax has been sent to the wrong places. Pt states she does not want to speak to anyone besides NP Rollene. Advised the py she may not call right away as she may be in clinics. Pt states she also just had her hearing and wanted her to know.

## 2024-02-03 NOTE — Telephone Encounter (Signed)
 Noted

## 2024-02-03 NOTE — Patient Instructions (Addendum)
 Zepbound  paperwork faxed -- so hopeful this will be THE TIME!  Xrays of low back and abdomen to look for stone.   Trial Zepbound  2.5mg  once per week injected subcutaneously ( [place])  in stomach. Please clean with alcohol  swab prior to injection and be sure to rotate site. You may schedule a nurse visit if you would like to first injection.   After 4 weeks, and if tolerated and weight loss has not reached 1-2 lbs per week, please increase to 5mg  once per week [place].    Please read information on medication below and remember black box warning that you may not take if you or a family member is diagnosed with thyroid  cancer (medullary thyroid  cancer), or multiple endocrine neoplasia.      Tirzepatide  Injection (Weight Management) What is this medication? TIRZEPATIDE  (tir ZEP a tide) promotes weight loss. It may also be used to maintain weight loss.  It works by decreasing appetite. Changes to diet and exercise are often combined with this medication. This medicine may be used for other purposes; ask your health care provider or pharmacist if you have questions. COMMON BRAND NAME(S): Zepbound  What should I tell my care team before I take this medication? They need to know if you have any of these conditions: Diabetes Eye disease caused by diabetes Gallbladder disease Have or have had depression Have or have had pancreatitis Having surgery Kidney disease Personal or family history of MEN 2, a condition that causes endocrine gland tumors Personal or family history of thyroid  cancer Stomach or intestine problems, such as problems digesting food Suicidal thoughts, plans, or attempt An unusual or allergic reaction to tirzepatide , other medications, foods, dyes, or preservatives Pregnant or trying to get pregnant Breastfeeding How should I use this medication? This medication is injected under the skin. You will be taught how to prepare and give it. Take it as directed on the prescription label.  Keep taking it unless your care team tells you to stop. It is important that you put your used needles and syringes in a special sharps container. Do not put them in a trash can. If you do not have a sharps container, call your pharmacist or care team to get one. A special MedGuide will be given to you by the pharmacist with each prescription and refill. Be sure to read this information carefully each time. This medication comes with INSTRUCTIONS FOR USE. Ask your pharmacist for directions on how to use this medication. Read the information carefully. Talk to your pharmacist or care team if you have questions. Talk to your care team about the use of this medication in children. Special care may be needed. Overdosage: If you think you have taken too much of this medicine contact a poison control center or emergency room at once. NOTE: This medicine is only for you. Do not share this medicine with others. What if I miss a dose? If you miss a dose, take it as soon as you can unless it is more than 4 days (96 hours) late. If it is more than 4 days late, skip the missed dose. Take the next dose at the normal time. Do not take 2 doses within 3 days (72 hours) of each other. What may interact with this medication? Certain medications for diabetes, such as insulin , glyburide, glipizide This medication may affect how other medications work. Talk with your care team about all of the medications you take. They may suggest changes to your treatment plan to lower the  risk of side effects and to make sure your medications work as intended. This list may not describe all possible interactions. Give your health care provider a list of all the medicines, herbs, non-prescription drugs, or dietary supplements you use. Also tell them if you smoke, drink alcohol , or use illegal drugs. Some items may interact with your medicine. What should I watch for while using this medication? Visit your care team for regular checks on  your progress. Tell your care team if your condition does not start to get better or if it gets worse. Tell your care team if you are taking medication to treat diabetes, such as insulin  or glipizide. This may increase your risk of low blood sugar. Know the symptoms of low blood sugar and how to treat it. Talk to your care team about your risk of cancer. You may be more at risk for certain types of cancer if you take this medication. Talk to your care team right away if you have a lump or swelling in your neck, hoarseness that does not go away, trouble swallowing, shortness of breath, or trouble breathing. Make sure you stay hydrated while taking this medication. Drink water often. Eat fruits and veggies that have a high water content. Drink more water when it is hot or you are active. Talk to your care team right away if you have fever, infection, vomiting, diarrhea, or if you sweat a lot while taking this medication. The loss of too much body fluid may make it dangerous for you to take this medication. If you are going to need surgery or a procedure, tell your care team that you are taking this medication. Estrogen and progestin hormones that you take by mouth may not work as well while you are taking this medication. Switch to a non-oral contraceptive or add a barrier contraceptive for 4 weeks after starting this medication and after each dose increase. Talk to your care team about contraceptive options. They can help you find the option that works for you. What side effects may I notice from receiving this medication? Side effects that you should report to your care team as soon as possible: Allergic reactions or angioedema--skin rash, itching or hives, swelling of the face, eyes, lips, tongue, arms, or legs, trouble swallowing or breathing Bowel blockage--stomach cramping, unable to have a bowel movement or pass gas, loss of appetite, vomiting Change in vision Dehydration--increased thirst, dry  mouth, feeling faint or lightheaded, headache, dark yellow or brown urine Gallbladder problems--severe stomach pain, nausea, vomiting, fever Kidney injury--decrease in the amount of urine, swelling of the ankles, hands, or feet Pancreatitis--severe stomach pain that spreads to your back or gets worse after eating or when touched, fever, nausea, vomiting Thoughts of suicide or self-harm, worsening mood, feelings of depression Thyroid  cancer--new mass or lump in the neck, pain or trouble swallowing, trouble breathing, hoarseness Side effects that usually do not require medical attention (report these to your care team if they continue or are bothersome): Diarrhea Loss of appetite Nausea Upset stomach This list may not describe all possible side effects. Call your doctor for medical advice about side effects. You may report side effects to FDA at 1-800-FDA-1088. Where should I keep my medication? Keep out of the reach of children and pets. Store in a refrigerator or at room temperature up to 30 degrees C (86 degrees F). Keep it in the original container. Protect from light. Refrigeration (preferred): Store in the refrigerator. Do not freeze. Get rid of any  unused medication after the expiration date. Room temperature: This medication may be stored at room temperature for up to 21 days. If it is stored at room temperature, get rid of any unused medication after 21 days or after it expires, whichever is first. To get rid of medications that are no longer needed or have expired: Take the medication to a medication take-back program. Check with your pharmacy or law enforcement to find a location. If you cannot return the medication, ask your pharmacist or care team how to get rid of this medication safely. NOTE: This sheet is a summary. It may not cover all possible information. If you have questions about this medicine, talk to your doctor, pharmacist, or health care provider.  2024 Elsevier/Gold  Standard (2023-04-16 00:00:00)

## 2024-02-03 NOTE — Telephone Encounter (Signed)
 Faxed paperwork while pt was in office gave her a copy and ok that third party received it

## 2024-02-04 ENCOUNTER — Ambulatory Visit: Payer: Self-pay | Admitting: Family

## 2024-02-04 DIAGNOSIS — E88819 Insulin resistance, unspecified: Secondary | ICD-10-CM

## 2024-02-04 LAB — URINE CULTURE
MICRO NUMBER:: 16986052
Result:: NO GROWTH
SPECIMEN QUALITY:: ADEQUATE

## 2024-02-06 NOTE — Assessment & Plan Note (Signed)
 Letter and appeal for Zepbound  provided today to insurer.  Zepbound  2.5 mg prescribed.

## 2024-02-06 NOTE — Assessment & Plan Note (Addendum)
 Acute on chronic, left-sided back pain.  Differential includes degenerative disc disease, renal stone.  Pending urinalysis, urine culture, abdominal, thoracic and lumbar x-rays.  Consider physical therapy, massage (if covered by insurance) once images result.  Continue heat.

## 2024-02-07 DIAGNOSIS — D329 Benign neoplasm of meninges, unspecified: Secondary | ICD-10-CM | POA: Diagnosis not present

## 2024-02-07 DIAGNOSIS — I6782 Cerebral ischemia: Secondary | ICD-10-CM | POA: Diagnosis not present

## 2024-02-07 DIAGNOSIS — D320 Benign neoplasm of cerebral meninges: Secondary | ICD-10-CM | POA: Diagnosis not present

## 2024-02-09 DIAGNOSIS — K760 Fatty (change of) liver, not elsewhere classified: Secondary | ICD-10-CM | POA: Diagnosis not present

## 2024-02-09 DIAGNOSIS — K9049 Malabsorption due to intolerance, not elsewhere classified: Secondary | ICD-10-CM | POA: Diagnosis not present

## 2024-02-10 DIAGNOSIS — F321 Major depressive disorder, single episode, moderate: Secondary | ICD-10-CM | POA: Diagnosis not present

## 2024-02-15 ENCOUNTER — Telehealth (INDEPENDENT_AMBULATORY_CARE_PROVIDER_SITE_OTHER): Admitting: Psychology

## 2024-02-15 DIAGNOSIS — F419 Anxiety disorder, unspecified: Secondary | ICD-10-CM

## 2024-02-15 DIAGNOSIS — F32A Depression, unspecified: Secondary | ICD-10-CM

## 2024-02-15 DIAGNOSIS — F5089 Other specified eating disorder: Secondary | ICD-10-CM

## 2024-02-15 NOTE — Progress Notes (Signed)
  Office: [phone]  /  Fax: [phone]    Date: February 15, 2024  Appointment Start Time: 11:43am Duration: 30 minutes Provider: Wyatt Ruiz, Brittney Ruiz. Type of Session: Individual Therapy  Location of Patient: Home (private location) Location of Provider: Provider's Home (private office) Type of Contact: Telepsychological Visit via MyChart Video Visit  Session Content: Brittney Ruiz is a 58 y.o. female presenting for a follow-up appointment to address the previously established treatment goal of increasing coping skills. Today's appointment was a telepsychological visit. Brittney Ruiz provided verbal consent for today's telepsychological appointment and she is aware she is responsible for securing confidentiality on her end of the session. Prior to proceeding with today's appointment, Brittney Ruiz's physical location at the time of this appointment was obtained as well a phone number she could be reached at in the event of technical difficulties. Brittney Ruiz and this provider participated in today's telepsychological service.   This provider conducted a brief check-in. Brittney Ruiz stated she should know about the Zepbound  rx appeal tomorrow. She further shared she left a message with one of the referrals previously shared, and she noted a plan to call back again today as she has not heard back. Regarding eating habits, she is focusing on what her body can tolerate. She also described focusing on making better choices and engaging in portion control. Psychoeducation provided regarding mindful eating strategies (sit down, slowly chew, savor, simplify, and smile). Overall, Brittney Ruiz was receptive to today's appointment as evidenced by openness to sharing, responsiveness to feedback, and willingness to implement discussed strategies .  Mental Status Examination:  Appearance: neat Behavior: appropriate to circumstances Mood: sad Affect: mood congruent Speech: WNL Eye Contact: appropriate Psychomotor Activity: WNL Gait: unable to  assess Thought Process: linear, logical, and goal directed and denies suicidal, homicidal, and self-harm ideation, plan and intent since the last appointment with this provider Thought Content/Perception: no hallucinations, delusions, bizarre thinking or behavior endorsed or observed Orientation: AAOx4 Memory/Concentration: intact Insight: fair Judgment: fair  Interventions:  Conducted a brief chart review Conducted a risk assessment Provided empathic reflections and validation Reviewed content from the previous session Provided positive reinforcement Employed supportive psychotherapy interventions to facilitate reduced distress and to improve coping skills with identified stressors Psychoeducation provided regarding mindful eating strategies  DSM-5 Diagnosis(es): F50.89 Other Specified Feeding or Eating Disorder, Emotional Eating Behaviors, F41.9 Unspecified Anxiety Disorder, and  F32.A Unspecified Depressive Disorder  Treatment Goal & Progress:  During the initial appointment with this provider, the following treatment goal was established: increase coping skills. Brittney Ruiz has demonstrated progress in her goal as evidenced by increased awareness of hunger patterns and increased awareness of triggers for emotional eating behaviors. Brittney Ruiz also continues to demonstrate willingness to engage in learned skill(s).   Plan: The next appointment is scheduled for 02/29/2024 at 11:30am, which will be via MyChart Video Visit. The next session will focus on working towards the established treatment goal. Brittney Ruiz will continue with her behavioral health provider with [facility].    Brittney Fire, PsyD

## 2024-02-16 ENCOUNTER — Telehealth: Payer: Self-pay

## 2024-02-16 DIAGNOSIS — F411 Generalized anxiety disorder: Secondary | ICD-10-CM | POA: Diagnosis not present

## 2024-02-16 DIAGNOSIS — F4001 Agoraphobia with panic disorder: Secondary | ICD-10-CM | POA: Diagnosis not present

## 2024-02-16 DIAGNOSIS — F410 Panic disorder [episodic paroxysmal anxiety] without agoraphobia: Secondary | ICD-10-CM | POA: Diagnosis not present

## 2024-02-16 DIAGNOSIS — F39 Unspecified mood [affective] disorder: Secondary | ICD-10-CM | POA: Diagnosis not present

## 2024-02-16 NOTE — Telephone Encounter (Signed)
 Faxed to  [facility] ORDER Fax number : [phone] pt is aware

## 2024-02-16 NOTE — Telephone Encounter (Signed)
 Copied from CRM #[id number]. Topic: Clinical - Medical Advice >> Feb 16, 2024  2:08 PM Thersia BROCKS wrote: Reason for CRM: Patient stated she has been taking that yeast pill that has been prescribed to her but she has breaking out in a rash, not sure if its from that. But she stated she has stopped taking it and only took it once

## 2024-02-16 NOTE — Telephone Encounter (Unsigned)
 Copied from CRM [id number]. Topic: Clinical - Medication Question >> Feb 16, 2024  2:07 PM Thersia BROCKS wrote: Reason for CRM: Patient called in stated that tirzepatide  (ZEPBOUND ) 2.5 MG/0.5ML Pen has been approved, and that it needs to be faxed over to   [facility] ORDER  Fax number : [phone]

## 2024-02-17 ENCOUNTER — Other Ambulatory Visit: Payer: Self-pay | Admitting: Family

## 2024-02-17 DIAGNOSIS — R7303 Prediabetes: Secondary | ICD-10-CM

## 2024-02-17 DIAGNOSIS — B372 Candidiasis of skin and nail: Secondary | ICD-10-CM

## 2024-02-17 DIAGNOSIS — K760 Fatty (change of) liver, not elsewhere classified: Secondary | ICD-10-CM

## 2024-02-17 DIAGNOSIS — G4733 Obstructive sleep apnea (adult) (pediatric): Secondary | ICD-10-CM

## 2024-02-17 DIAGNOSIS — E88819 Insulin resistance, unspecified: Secondary | ICD-10-CM

## 2024-02-17 MED ORDER — CLOTRIMAZOLE 1 % EX CREA: 1.0000 | TOPICAL_CREAM | Freq: Two times a day (BID) | CUTANEOUS | 1 refills | Status: AC

## 2024-02-17 MED ORDER — ZEPBOUND 2.5 MG/0.5ML ~~LOC~~ SOAJ
2.5000 mg | SUBCUTANEOUS | 2 refills | Status: DC
Start: 2024-02-17 — End: 2024-02-25

## 2024-02-17 NOTE — Telephone Encounter (Signed)
 Addressed via mychart

## 2024-02-21 DIAGNOSIS — M9902 Segmental and somatic dysfunction of thoracic region: Secondary | ICD-10-CM | POA: Diagnosis not present

## 2024-02-21 DIAGNOSIS — M9901 Segmental and somatic dysfunction of cervical region: Secondary | ICD-10-CM | POA: Diagnosis not present

## 2024-02-21 DIAGNOSIS — M9903 Segmental and somatic dysfunction of lumbar region: Secondary | ICD-10-CM | POA: Diagnosis not present

## 2024-02-21 DIAGNOSIS — M62830 Muscle spasm of back: Secondary | ICD-10-CM | POA: Diagnosis not present

## 2024-02-21 DIAGNOSIS — M5136 Other intervertebral disc degeneration, lumbar region with discogenic back pain only: Secondary | ICD-10-CM | POA: Diagnosis not present

## 2024-02-22 MED ORDER — TIRZEPATIDE-WEIGHT MANAGEMENT 2.5 MG/0.5ML ~~LOC~~ SOAJ: 2.5000 mg | Freq: Once | SUBCUTANEOUS | Status: DC

## 2024-02-23 DIAGNOSIS — K7581 Nonalcoholic steatohepatitis (NASH): Secondary | ICD-10-CM | POA: Diagnosis not present

## 2024-02-23 DIAGNOSIS — I10 Essential (primary) hypertension: Secondary | ICD-10-CM | POA: Diagnosis not present

## 2024-02-23 DIAGNOSIS — E785 Hyperlipidemia, unspecified: Secondary | ICD-10-CM | POA: Diagnosis not present

## 2024-02-23 DIAGNOSIS — F32A Depression, unspecified: Secondary | ICD-10-CM | POA: Diagnosis not present

## 2024-02-23 DIAGNOSIS — K219 Gastro-esophageal reflux disease without esophagitis: Secondary | ICD-10-CM | POA: Diagnosis not present

## 2024-02-23 DIAGNOSIS — F419 Anxiety disorder, unspecified: Secondary | ICD-10-CM | POA: Diagnosis not present

## 2024-02-23 DIAGNOSIS — G43909 Migraine, unspecified, not intractable, without status migrainosus: Secondary | ICD-10-CM | POA: Diagnosis not present

## 2024-02-23 DIAGNOSIS — J45909 Unspecified asthma, uncomplicated: Secondary | ICD-10-CM | POA: Diagnosis not present

## 2024-02-23 DIAGNOSIS — E66812 Obesity, class 2: Secondary | ICD-10-CM | POA: Diagnosis not present

## 2024-02-24 ENCOUNTER — Telehealth: Payer: Self-pay

## 2024-02-24 DIAGNOSIS — F411 Generalized anxiety disorder: Secondary | ICD-10-CM | POA: Diagnosis not present

## 2024-02-24 DIAGNOSIS — F321 Major depressive disorder, single episode, moderate: Secondary | ICD-10-CM | POA: Diagnosis not present

## 2024-02-24 NOTE — Telephone Encounter (Signed)
 Copied from CRM #[id number]. Topic: Clinical - Prescription Issue >> Feb 24, 2024 10:52 AM [place] J wrote: Reason for CRM: Patient's tirzepatide  (ZEPBOUND ) Pen 2.5 mg needs to be sent to a different pharmacy due to [facility] not having the medication in stock. She requested this switch last week but it was never sent per pharmacy.  [facility] send to: [id number] [place], [place] - [address] [place] [address] Phone: [phone] Fax: [phone] Hours: Not open 24 hours

## 2024-02-25 ENCOUNTER — Other Ambulatory Visit: Payer: Self-pay | Admitting: Family

## 2024-02-25 ENCOUNTER — Other Ambulatory Visit: Payer: Self-pay

## 2024-02-25 DIAGNOSIS — R7303 Prediabetes: Secondary | ICD-10-CM

## 2024-02-25 DIAGNOSIS — G4733 Obstructive sleep apnea (adult) (pediatric): Secondary | ICD-10-CM

## 2024-02-25 DIAGNOSIS — K760 Fatty (change of) liver, not elsewhere classified: Secondary | ICD-10-CM

## 2024-02-25 DIAGNOSIS — E88819 Insulin resistance, unspecified: Secondary | ICD-10-CM

## 2024-02-25 MED ORDER — ZEPBOUND 2.5 MG/0.5ML ~~LOC~~ SOAJ: 2.5000 mg | SUBCUTANEOUS | 2 refills | Status: DC

## 2024-02-25 NOTE — Telephone Encounter (Signed)
 Medication has already been sent.

## 2024-02-29 ENCOUNTER — Telehealth (INDEPENDENT_AMBULATORY_CARE_PROVIDER_SITE_OTHER): Admitting: Psychology

## 2024-02-29 ENCOUNTER — Ambulatory Visit: Payer: Self-pay

## 2024-02-29 DIAGNOSIS — F5089 Other specified eating disorder: Secondary | ICD-10-CM | POA: Diagnosis not present

## 2024-02-29 DIAGNOSIS — F32A Depression, unspecified: Secondary | ICD-10-CM

## 2024-02-29 DIAGNOSIS — F419 Anxiety disorder, unspecified: Secondary | ICD-10-CM

## 2024-02-29 NOTE — Progress Notes (Signed)
  Office: [phone]  /  Fax: [phone]    Date: February 29, 2024  Appointment Start Time: 11:30am Duration: 29 minutes Provider: Wyatt Fire, Psy.D. Type of Session: Individual Therapy  Location of Patient: Home (private location) Location of Provider: [facility] Type of Contact: Telepsychological Visit via MyChart Video Visit  Session Content: Brittney Ruiz is a 58 y.o. female presenting for a follow-up appointment to address the previously established treatment goal of increasing coping skills. Today's appointment was a telepsychological visit. Brittney Ruiz provided verbal consent for today's telepsychological appointment and she is aware she is responsible for securing confidentiality on her end of the session. Prior to proceeding with today's appointment, Brittney Ruiz's physical location at the time of this appointment was obtained as well a phone number she could be reached at in the event of technical difficulties. Brittney Ruiz and this provider participated in today's telepsychological service.   Of note, Brittney Ruiz's husband, Brittney Ruiz, was periodically walking throughout the house during today's appointment. Limits of confidentiality were discussed. Brittney Ruiz expressed understanding and provided verbal consent to proceed.   This provider conducted a brief check-in. Brittney Ruiz stated she started Brittney Ruiz , noting she is not feeling well (e.g., aches, nausea, bloating). It was recommended she contact her prescribing provider. Brittney Ruiz agreed. Additionally, Brittney Ruiz noted, I don't know what to eat. This was further explored. She explained due to current medications, health concerns, and different providers sharing different recommendations, she is feeling stuck. Progress to date discussed, and associated frustration was processed. Overall, Brittney Ruiz was receptive to today's appointment as evidenced by openness to sharing and responsiveness to feedback.  Mental Status Examination:  Appearance: neat Behavior: appropriate to  circumstances Mood: sad Affect: mood congruent Speech: WNL Eye Contact: appropriate Psychomotor Activity: WNL Gait: unable to assess Thought Process: linear, logical, and goal directed and denies suicidal, homicidal, and self-harm ideation, plan and intent  Thought Content/Perception: no hallucinations, delusions, bizarre thinking or behavior endorsed or observed Orientation: AAOx4 Memory/Concentration: intact Insight: fair Judgment: fair  Interventions:  Conducted a brief chart review Provided empathic reflections and validation Employed supportive psychotherapy interventions to facilitate reduced distress and to improve coping skills with identified stressors Engaged patient in problem solving  DSM-5 Diagnosis(es): F50.89 Other Specified Feeding or Eating Disorder, Emotional Eating Behaviors, F41.9 Unspecified Anxiety Disorder, and  F32.A Unspecified Depressive Disorder  Treatment Goal & Progress: During the initial appointment with this provider, the following treatment goal was established: increase coping skills. Brittney Ruiz has demonstrated progress in her goal as evidenced by increased awareness of hunger patterns and increased awareness of triggers for emotional eating behaviors. Brittney Ruiz also continues to demonstrate willingness to engage in learned skill(s).   Plan: The next appointment is scheduled for 03/13/2024 at 2pm, which will be via MyChart Video Visit. The next session will focus on working towards the established treatment goal. Brittney Ruiz will continue with her behavioral health provider with [facility]. Additionally, it was recommended she call the clinic to reschedule her canceled appointment. She will also f/u with her PCP (prescribed Brittney Ruiz ) and she agreed to seek further medical attention if deemed necessary. Moreover, Brittney Ruiz provided verbal consent for this provider to discuss billing related concerns with Jason Nicoletta, Director Physician Practices.     Wyatt Fire, PsyD

## 2024-02-29 NOTE — Telephone Encounter (Signed)
 FYI Only or Action Required?: Action required by provider: clinical question for provider. Pt with onset of symptoms after starting Zepbound  Sunday. Seeking guidance.   Patient was last seen in primary care on 02/03/2024 by Dineen Rollene MATSU, FNP.  Called Nurse Triage reporting Medication Reaction.  Symptoms began several days ago.  Interventions attempted: OTC medications: Colace and Metamucil and Prescription medications: Zepbound .  Symptoms are: gradually worsening.  Triage Disposition: Call PCP Now  Patient/caregiver understands and will follow disposition?: Yes  Copied from CRM #[id number]. Topic: Clinical - Red Word Triage >> Feb 29, 2024 12:03 PM Corin V wrote: [facility] that prompted transfer to Nurse Triage: Patient started taking Zepbound  on Sunday. She has been feeling sick, nauseated, confused, dizzy, bloating, aching head to toe. She feels like she has the flu but knows she does not. She cannot use the bathroom. She said it feels like the keto flu.  Reason for Disposition  [1] Caller has URGENT medicine question about med that primary care doctor (or NP/PA) or specialist prescribed AND [2] triager unable to answer question  Answer Assessment - Initial Assessment Questions 1. NAME of MEDICINE: What medicine(s) are you calling about?     Zepbound  2.5 mg weekly sq injection, started on Sunday.  2. QUESTION: What is your question? (e.g., double dose of medicine, side effect)     Onset of GI distress, poor appetite, general body aches and brain fog since starting Zepbound  on Sunday.  3. PRESCRIBER: Who prescribed the medicine? Reason: if prescribed by specialist, call should be referred to that group.     Dineen Rollene MATSU, FNP  4. SYMPTOMS: Do you have any symptoms? If Yes, ask: What symptoms are you having?  How bad are the symptoms (e.g., mild, moderate, severe)     Stomach feels sore and bloated, not painful. Nauseous, 1x episode of emesis. Constipated, taking  metamucil and colace which has been ineffective. Normally has BM everyday. Poor appetite. Brain fog. Generalized body aches. No fever. Tingly tongue.  Protocols used: Medication Question Call-A-AH

## 2024-03-01 NOTE — Telephone Encounter (Signed)
 Spoke to Brittney Ruiz she was having all sxs in message below, she also stated that yall had spoken on her maybe doing it every 10 days instead of every week. She states that however you want her to do it she will, if you want her to stop completely she will do that also. Please adviuse

## 2024-03-01 NOTE — Telephone Encounter (Signed)
 Spoke to pt I went over message below in detail per Rollene, she verbalized understanding, she will inform us  of what she decides to do     It is very difficult to advise repeating in 10 days ; she had NUMEROUS side effects and was unable to use the bathroom.    I would defer to patient if she want to try one more time in 10 days; ALL symptoms would need to be resolved however prior to re-trial.   If any side effects again,I advise stopping medication.

## 2024-03-02 DIAGNOSIS — F321 Major depressive disorder, single episode, moderate: Secondary | ICD-10-CM | POA: Diagnosis not present

## 2024-03-02 DIAGNOSIS — F411 Generalized anxiety disorder: Secondary | ICD-10-CM | POA: Diagnosis not present

## 2024-03-13 ENCOUNTER — Telehealth (INDEPENDENT_AMBULATORY_CARE_PROVIDER_SITE_OTHER): Admitting: Psychology

## 2024-03-13 DIAGNOSIS — F5089 Other specified eating disorder: Secondary | ICD-10-CM

## 2024-03-13 DIAGNOSIS — F419 Anxiety disorder, unspecified: Secondary | ICD-10-CM

## 2024-03-13 DIAGNOSIS — F32A Depression, unspecified: Secondary | ICD-10-CM

## 2024-03-13 NOTE — Progress Notes (Signed)
  Office: [phone]  /  Fax: [phone]    Date: March 13, 2024  Appointment Start Time: 2:01pm Duration: 36 minutes Provider: Wyatt Fire, Psy.D. Type of Session: Individual Therapy  Location of Patient: Home (private location) Location of Provider: Provider's Home (private office) Type of Contact: Telepsychological Visit via MyChart Video Visit  Session Content: Brittney Ruiz is a 58 y.o. female presenting for a follow-up appointment to address the previously established treatment goal of increasing coping skills. Today's appointment was a telepsychological visit. Brittney Ruiz provided verbal consent for today's telepsychological appointment and she is aware she is responsible for securing confidentiality on her end of the session. Prior to proceeding with today's appointment, Brittney Ruiz's physical location at the time of this appointment was obtained as well a phone number she could be reached at in the event of technical difficulties. Brittney Ruiz and this provider participated in today's telepsychological service.   This provider conducted a brief check-in. Brittney Ruiz stated a belief her blood pressure dropped resulting in dizziness.  She further shared about side effects from Zepbound . Regarding eating habits, she noted she is trying to eat what [she] can. She acknowledged she is not consuming enough protein nor is she eating enough overall. Further explored and processed. Brittney Ruiz acknowledged, I don't know that I want to do this [referring to Zepbound ]. Associated thoughts and feelings were processed. Remainder of today focused on exploring self-talk. Discussed the benefits of positive self-talk, including its impact on feelings and behaviors. For homework, she was asked to tune into her own self-talk and use the STOP technique to help cope. Session concluded with this provider sharing about recent Medicare changes related to telehealth/in-person [facility] visits. Overall, Brittney Ruiz was receptive to today's appointment as evidenced  by openness to sharing, responsiveness to feedback, and willingness to implement discussed strategies .  Mental Status Examination:  Appearance: neat Behavior: appropriate to circumstances Mood: sad Affect: mood congruent Speech: WNL Eye Contact: intermittent Psychomotor Activity: WNL Gait: unable to assess Thought Process: linear, logical, and goal directed and denies suicidal, homicidal, and self-harm ideation, plan and intent  Thought Content/Perception: no hallucinations, delusions, bizarre thinking or behavior endorsed or observed Orientation: AAOx4 Memory/Concentration: intact Insight: fair Judgment: fair  Interventions:  Conducted a brief chart review Conducted a risk assessment Provided empathic reflections and validation Reviewed content from the previous session Employed supportive psychotherapy interventions to facilitate reduced distress and to improve coping skills with identified stressors Engaged patient in thought challenging/cognitive restructuring   DSM-5 Diagnosis(es): F50.89 Other Specified Feeding or Eating Disorder, Emotional Eating Behaviors, F41.9 Unspecified Anxiety Disorder, and  F32.A Unspecified Depressive Disorder  Treatment Goal & Progress: During the initial appointment with this provider, the following treatment goal was established: increase coping skills. Brittney Ruiz has demonstrated progress in her goal as evidenced by increased awareness of hunger patterns and increased awareness of triggers for emotional eating behaviors. Brittney Ruiz also continues to demonstrate willingness to engage in learned skill(s).   Plan: The next appointment is scheduled for 03/27/2024 at 12:30pm, which will be in-person at [facility] (address shared). The next session will focus on working towards the established treatment goal. Brittney Ruiz will continue with her behavioral health provider with [facility]. She agreed to f/u with PCP and/or visit nearest ED if blood pressure related issues persist  and/or worsen. Additionally, Brittney Ruiz will call [facility] to schedule a f/u with her other provider.     Wyatt Fire, PsyD

## 2024-03-14 ENCOUNTER — Encounter (INDEPENDENT_AMBULATORY_CARE_PROVIDER_SITE_OTHER): Payer: Self-pay | Admitting: Physician Assistant

## 2024-03-14 ENCOUNTER — Ambulatory Visit (INDEPENDENT_AMBULATORY_CARE_PROVIDER_SITE_OTHER): Admitting: Physician Assistant

## 2024-03-14 VITALS — BP 122/81 | HR 56 | Temp 98.4°F | Ht 62.0 in | Wt 191.0 lb

## 2024-03-14 DIAGNOSIS — E739 Lactose intolerance, unspecified: Secondary | ICD-10-CM | POA: Diagnosis not present

## 2024-03-14 DIAGNOSIS — E669 Obesity, unspecified: Secondary | ICD-10-CM

## 2024-03-14 DIAGNOSIS — E785 Hyperlipidemia, unspecified: Secondary | ICD-10-CM | POA: Diagnosis not present

## 2024-03-14 DIAGNOSIS — K5900 Constipation, unspecified: Secondary | ICD-10-CM

## 2024-03-14 DIAGNOSIS — K5901 Slow transit constipation: Secondary | ICD-10-CM

## 2024-03-14 DIAGNOSIS — K760 Fatty (change of) liver, not elsewhere classified: Secondary | ICD-10-CM

## 2024-03-14 DIAGNOSIS — E782 Mixed hyperlipidemia: Secondary | ICD-10-CM

## 2024-03-14 DIAGNOSIS — R7303 Prediabetes: Secondary | ICD-10-CM | POA: Diagnosis not present

## 2024-03-14 DIAGNOSIS — Z6835 Body mass index (BMI) 35.0-35.9, adult: Secondary | ICD-10-CM

## 2024-03-14 DIAGNOSIS — K30 Functional dyspepsia: Secondary | ICD-10-CM

## 2024-03-14 NOTE — Progress Notes (Signed)
 SUBJECTIVE: Discussed the use of AI scribe software for clinical note transcription with the patient, who gave verbal consent to proceed.  Chief Complaint: Obesity  Interim History: She is down 6 lbs since her last visit with [facility] on 12/14/23.    Last in OV 12/14/23 with [name], NP.    Has continued counseling with Dr. [name]   Seen at [facility] in [place] as well.  Zepbound  ordered per PCP for hepatic steatosis, OSA and prediabetic state and she has completed her 3rd dose.    Brittney Ruiz is here to discuss her progress with her obesity treatment plan. She is not currently following a [facility] plan and would like to get some ideas for her current nutrition plan . She states she is not exercising but is going to physical therapy due to ongoing back pain with herniated disk.   Brittney Ruiz is a 58 year old female who presents for follow-up of her obesity treatment plan.  She has been on Zepbound  for three weeks, with her third injection recently administered. She experiences significant side effects, including fatigue, headaches, body aches, and gastrointestinal discomfort, primarily from Monday to Wednesday after her Sunday evening injection, with some improvement by Thursday.   Despite these side effects, she reports she has lost 7.6 pounds over two weeks. She is concerned about her protein intake and reports difficulty eating enough due to a lack of appetite, which has been further suppressed by Zepbound . We discussed ways to decrease side effects and that these side effects will usually improve over time.   She has a history of being under the care of a stroke team since February 2025 due to a suspected stroke. She reports that her primary care physician, endocrinologist, cardiologist, and neurologist recommended Zepbound  for her due to multiple health concerns. She has experienced significant challenges in getting the medication approved, including multiple denials and a court hearing  where she reports she was finally able to have it approved .  She reports gastrointestinal issues, including constipation exacerbated by Zepbound . She manages this with 1/3 cup of oatmeal, Miralax , Metamucil, and Colace. She has experimented with her diet to find tolerable foods, noting that raw vegetables and dairy products cause discomfort. She has been able to tolerate string cheese and plans to try cottage cheese. She is also experiencing difficulty with protein intake, as many protein sources cause gastrointestinal upset.  She has a history of high cholesterol and is cautious with egg consumption. She has tried various protein sources, including chicken and protein shakes, but struggles to meet her protein goals. Fatty foods, such as fried foods, exacerbate her gastrointestinal symptoms.  She is experiencing issues with hydration and electrolyte balance, particularly after her injections. She has been using electrolyte drinks diluted with water to manage this. Her blood sugar levels have decreased from the 120s to around 111-113.  She is concerned about the lack of follow-up regarding the side effects of Zepbound  and is seeking guidance on her diet and medication regimen. She feels she is currently consuming approximately 500 calories a day, which she acknowledges is insufficient. She is trying to increase her intake to meet her nutritional needs while managing her symptoms.  She is under care of GI and they are evaluating for food allergies/alpha gal panel/celiac serology with results currently pending. She has follow up tomorrow. Reports she was diagnosed with SIBO and advised to start a FODMAP diet, but she has not been able to start this and wants more  information about what she should be eating.  She does have a follow up visit with GI tomorrow to discuss results of the previous work up .      Medical hx:      Low back pain/DDD                         Plantar fasciitis            `                            Meningioma- with visual changes                                  Lung nodule                                   Multiple medication intolerance/allergies.                              Anxiety/Depression OBJECTIVE: Visit Diagnoses: Problem List Items Addressed This Visit     Generalized obesity   HLD (hyperlipidemia)   Hepatic steatosis - Primary   Prediabetes   Insulin  resistance   Other Visit Diagnoses       Slow transit constipation         Vitamin D  deficiency         BMI 35.0-35.9,adult Current BMI 35.0         Obesity Obesity management is the primary focus. She has been on Zepbound  2.5 mg weekly for three weeks, experiencing fatigue, headache, nausea, and constipation. Despite these side effects, she has lost 7.6 pounds in two weeks. She is sensitive to medications and struggles with appetite suppression, not meeting protein and caloric intake goals. The medication is expected to improve cardiovascular health, fatty liver, insulin  resistance, triglycerides, sleep apnea, and prediabetes. Based on the patient's last IC done in July 2025 with REE of 15**, we will restart on a Cat. 1 plan with goals of 1000-1100 calories daily and 75+ grams of protein daily.  - Continue Zepbound  2.5 mg weekly started for primary indication of hepatic steatosis, OSA and prediabetes - Maintain hydration- at least 80 or more ounces of water or fluids daily.  - Avoid eating fatty foods which may make nausea worsen.  - Focus on protein intake - Eat small, frequent meals focusing on protein first.  - Okay to Substitute small portions of oatmeal for bread to manage constipation   Hepatic steatosis Previous FIB 4 of 1.64 .   03/2021 US  of ABD IMPRESSION: 1. Mild amount of gallbladder sludge without evidence of cholelithiasis. 2. Hepatic steatosis.  Plan:  Disease counseling done.  Intensive lifestyle modifications are the first line treatment for this issue.  We discussed several  lifestyle modifications today and she will continue to work on diet, exercise and weight loss efforts.  Started Zepbound  ~ 02/25/24 per PCP Having mainly GI side effects, but also HA and fatigue. Feels she would like to continue Zepbound  if at all possible at this point to improve multiple health conditions.   Seeing [facility] and plan for fibro scan in follow up . Fib 4 score at that visit of 1.41 Counseling: MAFLD is an umbrella term that  encompasses a disease spectrum that includes steatosis (fat) without inflammation, steatohepatitis (MASH; fat + inflammation in a characteristic pattern), and cirrhosis. Bland steatosis is felt to be a benign condition, with extremely low to no risk of progression to cirrhosis, whereas MASH can progress to cirrhosis. The mainstay of treatment of MAFLD includes lifestyle modification to achieve weight loss, at least 7% of current body weight. Low carbohydrate diets can be beneficial in improving NAFLD liver histology. Additionally, exercise, even the absence of weight loss can have beneficial effects on the patient's metabolic profile and liver health. We recommend that their metabolic comorbidities be aggressively managed, as patients with NAFLD are at increased risk of coronary artery disease.  Hyperlipidemia LDL is not at goal. Medication(s): Statin intolerant.    Zepbound  begun 02/25/24 Cardiovascular risk factors: dyslipidemia, family history of premature cardiovascular disease, obesity (BMI >= 30 kg/m2), and sedentary lifestyle  Lab Results  Component Value Date   CHOL 180 11/15/2023   HDL 35 (L) 11/15/2023   LDLCALC 116 (H) 11/15/2023   LDLDIRECT 141.0 05/02/2018   TRIG 164 (H) 11/15/2023   CHOLHDL 5.1 (H) 11/15/2023   CHOLHDL 7.2 (H) 07/30/2022   CHOLHDL 7.4 (H) 06/09/2021   Lab Results  Component Value Date   ALT 29 11/15/2023   AST 24 11/15/2023   ALKPHOS 83 11/15/2023   BILITOT 0.4 11/15/2023   The 10-year ASCVD risk score (Arnett DK, et  al., 2019) is: 3.9%   Values used to calculate the score:     Age: 60 years     Clincally relevant sex: Female     Is Non-Hispanic African American: No     Diabetic: No     Tobacco smoker: No     Systolic Blood Pressure: 122 mmHg     Is BP treated: Yes     HDL Cholesterol: 35 mg/dL     Total Cholesterol: 180 mg/dL  Plan: Continue Zepbound  for multiple health conditions including CV risk reduction Continue to work on nutrition plan -decreasing simple carbohydrates, increasing lean proteins, decreasing saturated fats and cholesterol , avoiding trans fats and exercise as able to promote weight loss, improve lipids and decrease cardiovascular risks.  Prediabetes Last A1c was 5.5- at goal/ Insulin  12.1- not at goal.   Medication(s): Zepbound  2.5 mg SQ weekly  Several side effects- Nausea, bloating, constipation, HA and fatigue.   Polyphagia:No Lab Results  Component Value Date   HGBA1C 5.5 11/15/2023   HGBA1C 5.6 02/04/2023   HGBA1C 5.7 (H) 07/30/2022   HGBA1C 5.5 02/02/2022   HGBA1C 5.3 06/09/2021   Lab Results  Component Value Date   INSULIN  12.1 11/15/2023   INSULIN  12.3 02/04/2023   INSULIN  7.9 07/30/2022   INSULIN  16.0 02/02/2022   INSULIN  8.2 06/09/2021    Plan: Continue Zepbound  2.5 mg SQ weekly- PCP ordering currently.  Would not increase dose at this time and monitor closely.  Discussed strategies to decrease side effects.  Continue working on nutrition plan to decrease simple carbohydrates, increase lean proteins and exercise to promote weight loss, improve glycemic control and prevent progression to Type 2 diabetes.    Constipation Brittney Ruiz notes constipation.   This is likely related to/worsened by GLP-1/GIP medication.  She has been taking oatmeal daily + miralax +metamucil+Colace. Constipation is moderately controlled.   Plan: Continue current medications + oatmeal and monitor closely.  Continue to hydrate well- at least 80 ounces of water/fluids daily.      Cardiovascular Risk Management Zepbound  is used for cardiovascular risk management,  addressing fatty liver, insulin  resistance, triglycerides, sleep apnea, and prediabetes. She is encouraged to continue despite side effects, as improvement is expected. She may be a super responder, experiencing significant weight loss with a low dose, leading to improved cardiovascular health and reduced obesity-related risk factors. - Continue Zepbound  2.5 mg weekly  Gastrointestinal Issues She reports constipation, difficulty with raw vegetables, and dairy intolerance. She is advised to use oatmeal for constipation and experiment with food tolerance. A GI consultation is underway for further evaluation. - Follow up with GI doctor for follow up of possible food allergies, ? SIBO  Vitals Temp: 98.4 F (36.9 C) BP: 122/81 Pulse Rate: (!) 56 SpO2: 98 %   Anthropometric Measurements Height: 5' 2 (1.575 m) Weight: 191 lb (86.6 kg) BMI (Calculated): 34.93 Weight at Last Visit: 197 lb (Last in office visit 12/14/2023) Weight Lost Since Last Visit: 6 lb Weight Gained Since Last Visit: 0 Starting Weight: 217 lb Total Weight Loss (lbs): 26 lb (11.8 kg)   Body Composition  Body Fat %: 46.4 % Fat Mass (lbs): 88.8 lbs Muscle Mass (lbs): 97.2 lbs Total Body Water (lbs): 72.2 lbs Visceral Fat Rating : 13   Other Clinical Data Fasting: No Labs: No Today's Visit #: 44 Starting Date: 06/01/19 Comments: Last seen 3 months ago     ASSESSMENT AND PLAN:  Diet: Brittney Ruiz is currently in the action stage of change. As such, her goal is to continue with weight loss efforts. She has agreed to Category 1 Plan and keeping a food journal and adhering to recommended goals of 1000-1100 calories and 75+ grams of protein.  Exercise: Brittney Ruiz has been instructed continue Physical therapy and progress with activities as able for weight loss and overall health benefits.   Behavior Modification:  We discussed the  following Behavioral Modification Strategies today: increasing lean protein intake, decreasing simple carbohydrates, increasing vegetables, increase H2O intake, increase high fiber foods, meal planning and cooking strategies, avoiding temptations, planning for success, and keep a strict food journal. We discussed various medication options to help Brittney Ruiz with her weight loss efforts and we both agreed to start a Category 1 nutrition plan and continue Zepbound  2.5 mg weekly.  Return in about 5 weeks (around 04/18/2024).Brittney Ruiz She was informed of the importance of frequent follow up visits to maximize her success with intensive lifestyle modifications for her multiple health conditions.  Attestation Statements:   Reviewed by clinician on day of visit: allergies, medications, problem list, medical history, surgical history, family history, social history, and previous encounter notes.   Time spent on visit including pre-visit chart review and post-visit care and charting was 45 minutes.    Ryver Zadrozny, PA-C

## 2024-03-15 DIAGNOSIS — E739 Lactose intolerance, unspecified: Secondary | ICD-10-CM | POA: Diagnosis not present

## 2024-03-15 DIAGNOSIS — K5903 Drug induced constipation: Secondary | ICD-10-CM | POA: Diagnosis not present

## 2024-03-15 DIAGNOSIS — F321 Major depressive disorder, single episode, moderate: Secondary | ICD-10-CM | POA: Diagnosis not present

## 2024-03-15 DIAGNOSIS — F411 Generalized anxiety disorder: Secondary | ICD-10-CM | POA: Diagnosis not present

## 2024-03-20 ENCOUNTER — Encounter: Payer: Self-pay | Admitting: Family

## 2024-03-20 DIAGNOSIS — H53483 Generalized contraction of visual field, bilateral: Secondary | ICD-10-CM | POA: Diagnosis not present

## 2024-03-20 DIAGNOSIS — H5213 Myopia, bilateral: Secondary | ICD-10-CM | POA: Diagnosis not present

## 2024-03-20 DIAGNOSIS — H538 Other visual disturbances: Secondary | ICD-10-CM | POA: Diagnosis not present

## 2024-03-20 NOTE — Telephone Encounter (Signed)
 Called patient to inform her that she would need an appointment with Rollene Northern, FNP to discuss concerns. Also, Rollene wanted patient to continue to hold off on the Mounjaro , as she wanted to speak with patient's nutritionist first.   OK for E2C2 to give note and scheduled patient for Thursday at 11:30 am if patient calls back. If relayed, please notify the office.

## 2024-03-21 NOTE — Telephone Encounter (Signed)
 SABRA

## 2024-03-22 DIAGNOSIS — E66812 Obesity, class 2: Secondary | ICD-10-CM | POA: Diagnosis not present

## 2024-03-22 DIAGNOSIS — K7581 Nonalcoholic steatohepatitis (NASH): Secondary | ICD-10-CM | POA: Diagnosis not present

## 2024-03-22 DIAGNOSIS — K219 Gastro-esophageal reflux disease without esophagitis: Secondary | ICD-10-CM | POA: Diagnosis not present

## 2024-03-22 DIAGNOSIS — F419 Anxiety disorder, unspecified: Secondary | ICD-10-CM | POA: Diagnosis not present

## 2024-03-22 DIAGNOSIS — G43909 Migraine, unspecified, not intractable, without status migrainosus: Secondary | ICD-10-CM | POA: Diagnosis not present

## 2024-03-22 DIAGNOSIS — F32A Depression, unspecified: Secondary | ICD-10-CM | POA: Diagnosis not present

## 2024-03-22 DIAGNOSIS — I10 Essential (primary) hypertension: Secondary | ICD-10-CM | POA: Diagnosis not present

## 2024-03-22 DIAGNOSIS — E785 Hyperlipidemia, unspecified: Secondary | ICD-10-CM | POA: Diagnosis not present

## 2024-03-22 NOTE — Telephone Encounter (Signed)
 Called pt she is aware. She doesn't want to go to urgent care. She has appt to see you on the 13th and 20th of this month she wants to keep both b/c she doesn't know which one she can make it to. She is still having the symptoms she also stooped taking the zepbound  at this time

## 2024-03-23 DIAGNOSIS — R911 Solitary pulmonary nodule: Secondary | ICD-10-CM | POA: Diagnosis not present

## 2024-03-23 NOTE — Telephone Encounter (Signed)
 Noted

## 2024-03-24 DIAGNOSIS — R911 Solitary pulmonary nodule: Secondary | ICD-10-CM | POA: Diagnosis not present

## 2024-03-27 ENCOUNTER — Ambulatory Visit (INDEPENDENT_AMBULATORY_CARE_PROVIDER_SITE_OTHER): Payer: Self-pay | Admitting: Psychology

## 2024-03-27 DIAGNOSIS — F5089 Other specified eating disorder: Secondary | ICD-10-CM | POA: Diagnosis not present

## 2024-03-27 DIAGNOSIS — F32A Depression, unspecified: Secondary | ICD-10-CM

## 2024-03-27 DIAGNOSIS — F419 Anxiety disorder, unspecified: Secondary | ICD-10-CM

## 2024-03-27 NOTE — Progress Notes (Signed)
  Office: [phone]  /  Fax: [phone]    Date: March 27, 2024  Appointment Start Time: 12:29pm Duration: 51 minutes Provider: Wyatt Fire, Psy.D. Type of Session: Individual Therapy  Location of Patient & Provider: [facility] clinic at [facility] Type of Contact: In-person  Session Content: Brittney Ruiz is a 58 y.o. female presenting for a follow-up appointment to address the previously established treatment goal of increasing coping skills. This provider conducted a brief check-in. Brittney Ruiz shared she experienced significant side effects secondary to Zepbound , noting she lost 10 pounds but had to cease use. Associated thoughts and feelings were explored/processed. It was identified that her appetite has decreased over time due to health issues and Zepbound , but she also observed a desire not to eat with the hope of losing weight. It was recommended that she discuss low calorie intake due to current side effects during her PCP OV this Thursday. Additionally, psychoeducation provided regarding the consequences of not eating regularly and enough. Session also focused on developing a plan to address ongoing health issues that are likely also impacting eating habits. Furthermore, this provider shared she will be leaving the practice at the end of this year. Brittney Ruiz's thoughts/feelings were explored and processed. Brittney Ruiz noted a plan to discuss referral options with her [facility] behavioral heath provider and if none are provided, she will inform this provider at the next appointment so that referrals can be shared by this provider. Overall, Brittney Ruiz was receptive to today's appointment as evidenced by openness to sharing, responsiveness to feedback, and willingness to implement discussed strategies .  Mental Status Examination:  Appearance: neat Behavior: appropriate to circumstances Mood: sad Affect: mood congruent Speech: WNL Eye Contact: appropriate Psychomotor Activity: WNL Gait: normal Thought Process:  linear, logical, and goal directed and denies suicidal, homicidal, and self-harm ideation, plan and intent  Thought Content/Perception: no hallucinations, delusions, bizarre thinking or behavior endorsed or observed Orientation: AAOx4 Memory/Concentration: intact Insight: fair Judgment: fair  Interventions:  Conducted a brief chart review Conducted a risk assessment Provided empathic reflections and validation Reviewed content from the previous session Employed supportive psychotherapy interventions to facilitate reduced distress and to improve coping skills with identified stressors Engaged patient in problem solving Discussed termination  DSM-5 Diagnosis(es): F50.89 Other Specified Feeding or Eating Disorder, Emotional Eating Behaviors, F41.9 Unspecified Anxiety Disorder, and  F32.A Unspecified Depressive Disorder  Treatment Goal & Progress: During the initial appointment with this provider, the following treatment goal was established: increase coping skills. Brittney Ruiz has demonstrated progress in her goal as evidenced by increased awareness of hunger patterns and increased awareness of triggers for emotional eating behaviors. Brittney Ruiz also continues to demonstrate willingness to engage in learned skill(s).   Plan: The next appointment is scheduled for 05/11/2024 at 12:30pm, which will be via MyChart Video Visit. The next session will focus on working towards the established treatment goal. The next session will focus on working towards the established treatment goal. Brittney Ruiz will continue with her behavioral health provider with [facility]. This provider and Brittney Ruiz will continue processing this provider's departure from the practice.    Wyatt Fire, PsyD

## 2024-03-30 ENCOUNTER — Ambulatory Visit (INDEPENDENT_AMBULATORY_CARE_PROVIDER_SITE_OTHER): Admitting: Family

## 2024-03-30 ENCOUNTER — Encounter: Payer: Self-pay | Admitting: Family

## 2024-03-30 ENCOUNTER — Other Ambulatory Visit: Payer: Self-pay | Admitting: Family

## 2024-03-30 ENCOUNTER — Ambulatory Visit: Payer: Self-pay

## 2024-03-30 VITALS — BP 136/88 | HR 62 | Temp 97.4°F | Ht 62.0 in | Wt 190.1 lb

## 2024-03-30 DIAGNOSIS — G4733 Obstructive sleep apnea (adult) (pediatric): Secondary | ICD-10-CM | POA: Diagnosis not present

## 2024-03-30 DIAGNOSIS — K5909 Other constipation: Secondary | ICD-10-CM

## 2024-03-30 DIAGNOSIS — R42 Dizziness and giddiness: Secondary | ICD-10-CM

## 2024-03-30 DIAGNOSIS — R161 Splenomegaly, not elsewhere classified: Secondary | ICD-10-CM

## 2024-03-30 DIAGNOSIS — R7303 Prediabetes: Secondary | ICD-10-CM

## 2024-03-30 DIAGNOSIS — Z1231 Encounter for screening mammogram for malignant neoplasm of breast: Secondary | ICD-10-CM

## 2024-03-30 NOTE — Progress Notes (Signed)
 Assessment & Plan:  Splenomegaly -     Ambulatory referral to Hematology / Oncology  Prediabetes  Chronic constipation Assessment & Plan: Acute on chronic constipation.  Suspect Zepbound  is exacerbated.  Reviewed Dr. Williemae note.  Advised patient to trial Linzess to see if effective.  Advised not to try milk of magnesia at this time.  She may continue Colace.  May continue MiraLAX  if Linzess is not effective per Dr. Therisa.  Advised to reintroduce fiber foods including oatmeal as previously helpful for managing constipation   OSA (obstructive sleep apnea) Assessment & Plan: Discussed constellation of symptoms and associated with timing of Zepbound  and side effects.  Again reiterated to hold any further use of Zepbound  at this time, likely indefinitely.  Concerned with degree of side effects, loss of appetite.  Encouraged to slowly advance diet.  Close monitoring   Vertigo Assessment & Plan: Chronic, episodic.  Reviewed MRI 01/2024 .continue vestibular rehab therapy.  Will follow      Return precautions given.   Risks, benefits, and alternatives of the medications and treatment plan prescribed today were discussed, and patient expressed understanding.   Education regarding symptom management and diagnosis given to patient on AVS either electronically or printed.  No follow-ups on file.  Rollene Northern, FNP  Subjective:    Patient ID: Brittney Ruiz, female    DOB: 1966-02-08, 58 y.o.   MRN: [id number]  CC: Brittney Ruiz is a 58 y.o. female who presents today for follow up.   HPI: HPI  Discussed the use of AI scribe software for clinical note transcription with the patient, who gave verbal consent to proceed.  History of Present Illness   Brittney Ruiz is a 58 year old female who presents to follow-up to discuss Zepbound  side effects, constipation.   Five weeks ago, she started taking Zepbound  and experienced severe side effects, including 'lethargy, fatigue, brain fog,' as  well  nausea, loss of appetite.   She also experienced insomnia, anxiety.  She has been unable to consume more than 600 calories a day, relying on [employer] drinks despite discontinuing Zepbound  days ago.   She reports severe constipation, ongoing despite using Miralax , psyllium husk, and Colace. She has tried magnesium citrate and enemas with limited success and describes difficulty with bowel movements, requiring manual assistance. Linzess has been prescribed but not yet tried medication.  She had small bowel movement today.  Denies blood in stool   She woke up this morning with a room-spinning sensation of vertigo.She has a history of vertigo and is currently undergoing vestibular rehabilitation therapy. She suspects that looking up while taking pictures  may have triggered this episode. Denies vision loss.pulsatile tinnitus ,headache associated with the vertigo.  Chronic episodic bilateral tinnitus.  A recent CT scan showed an enlarged spleen. She has experienced left-sided pain, particularly when using her stepper, and has a history of bowel backup.  She has a history of lung nodules, which have been stable over the last few CT scans. She is concerned about the nodules and their monitoring, as previous scans did not detect them.      CT chest 11/7 /25 Splenomegaly and duodenal diverticuli, similar to prior.  No significant change in the two adjacent nodules in the right lower lobe  compared to 10/29/2022. These may represent sequelae of infection however  are indeterminate and can be reassessed on follow-up CT chest without  contrast in 6-12 months with goal of 2 year stability.   MRI  brain 02/07/24 Normal arterial intracranial flow voids are seen about the circle of  Willis.  The mastoid air cells are clear.  Minimal mucosal thickening left  maxillary sinus. Remainder of paranasal sinuses clear. The orbital  structures are unremarkable.  The extracranial soft tissues are   unremarkable.  Cerebellar tonsils are normal in position.  Calvarial signal  is within normal limits.  Stable T2 hyperintense extradural lesion adjacent  to the hypoglossal canal on the right does not enhance. This is likely  benign.   IMPRESSION:  1. Unchanged meningioma along the floor of the anterior cranial fossa on  the left adjacent to the olfactory groove.  2. Chronic microvascular ischemic changes in the white matter.  3. No acute intracranial process.   Consult Dr. Therisa 03/15/2024 for constipation associate with Zepbound  therapy.  Prescribed Linzess.  Stop Colace and MiraLAX .  Advised Metamucil may be continued but may cause gas.  Colonoscopy March 2024. Allergies: Codeine, Elemental sulfur, Porcine (pork) protein-containing drug products, Pork allergy, Aciphex [rabeprazole sodium], Antivert [meclizine hcl], Buspar [buspirone hcl], Cat dander, Chlorpheniramine-pseudoeph, Chlorpheniramine-pseudoeph, Chocolate flavoring agent (non-screening), Crestor [rosuvastatin calcium ], Cymbalta [duloxetine hcl], Duloxetine, Effexor xr [venlafaxine hydrochloride], Epinephrine , Erythromycin, Escitalopram oxalate, Lamictal [lamotrigine], Levofloxacin , Lipitor [atorvastatin ], Meclizine, Mometasone furoate, Mometasone furoate, Monosodium glutamate, Neurontin [gabapentin], Other, Prednisone, Red yeast rice extract, Rosuvastatin, Shellfish allergy, Sudafed [pseudoephedrine hcl], Sulfa antibiotics, Sulfasalazine, Valium , Wellbutrin [bupropion hcl], Wixela inhub [fluticasone -salmeterol], Xanax  xr [alprazolam ], Zetia  [ezetimibe ], Alfalfa, Brompheniramine-phenylephrine, Buspirone, and Tape Current Outpatient Medications on File Prior to Visit  Medication Sig Dispense Refill   acetaminophen  (TYLENOL ) 500 MG tablet Take 500 mg by mouth every 6 (six) hours as needed.     albuterol  (VENTOLIN  HFA) 108 (90 Base) MCG/ACT inhaler USE 2 INHALATIONS ORALLY   EVERY 6 HOURS AS NEEDED. 18 g 1   Alcohol  Swabs (B-D SINGLE USE  SWABS REGULAR) PADS USE 1 SWAB TOPICALLY TO    CLEAN FINGER BEFORE        CHECKING GLUCOSE 300 each 1   aspirin EC 81 MG tablet Take 81 mg by mouth.     B Complex Vitamins (BL VITAMIN B COMPLEX PO) +Folic acid  qd.     Blood Glucose Monitoring Suppl (TRUE METRIX METER) w/Device KIT USED TO CHECK GLUCOSE READINGS  ONE TIME DAILY AS NEEDED. 1 kit 0   Calcium  Carb-Cholecalciferol 600-10 MG-MCG CAPS Take by mouth.     clorazepate (TRANXENE) 7.5 MG tablet Take 7.5 mg by mouth 3 (three) times daily.     clotrimazole  (LOTRIMIN ) 1 % cream Apply 1 Application topically 2 (two) times daily. 30 g 1   EPINEPHrine  0.3 mg/0.3 mL IJ SOAJ injection Inject 0.3 mg into the muscle as needed for anaphylaxis. 2 each 1   fexofenadine  (ALLEGRA ) 60 MG tablet Take 1 tablet (60 mg total) by mouth daily. (Patient taking differently: Take 180 mg by mouth daily.) 90 tablet 1   fluticasone  (FLONASE ) 50 MCG/ACT nasal spray USE 1 SPRAY IN EACH NOSTRILAT BEDTIME 16 g 3   glucose blood (TRUE METRIX BLOOD GLUCOSE TEST) test strip TEST BLOOD SUGAR ONE TIME DAILY 100 strip 0   ibuprofen  (ADVIL ) 100 MG/5ML suspension Take 200 mg by mouth every 4 (four) hours as needed. Takes 200-300 mg daily as needed     Multiple Vitamin (MULTIVITAMIN) tablet Take 1 tablet by mouth daily.     Omega-3 Fatty Acids (FISH OIL) 1000 MG CAPS Take 2 capsules by mouth daily. Take 2 capsules daily which is 1200 mg per serving  omeprazole  (PRILOSEC) 20 MG capsule TAKE 1 CAPSULE DAILY 90 capsule 2   ondansetron  (ZOFRAN -ODT) 4 MG disintegrating tablet Take 2 mg by mouth every 6 (six) hours as needed.     polyethylene glycol powder (GLYCOLAX /MIRALAX ) 17 GM/SCOOP powder Take 1 Container by mouth at bedtime.     propranolol  (INDERAL ) 20 MG tablet Take 1 tablet (20 mg total) by mouth 3 (three) times daily. 90 tablet 3   Pseudoephedrine-DM (CHILDRENS DIMETAPP PLUS PO) Take by mouth. As needed     psyllium (METAMUCIL) 58.6 % powder Take 1 packet by mouth daily.      sertraline  (ZOLOFT ) 25 MG tablet Take 25 mg by mouth 2 (two) times daily.     TRUEplus Lancets 33G MISC TEST BLOOD SUGAR ONE TIME DAILY 100 each 0   tirzepatide  (ZEPBOUND ) 2.5 MG/0.5ML Pen Inject 2.5 mg into the skin once a week. (Patient not taking: Reported on 03/30/2024) 2 mL 2   No current facility-administered medications on file prior to visit.    Review of Systems  Constitutional:  Positive for appetite change, fatigue and unexpected weight change. Negative for chills and fever.  Eyes:  Negative for visual disturbance.  Respiratory:  Negative for cough.   Cardiovascular:  Negative for chest pain and palpitations.  Gastrointestinal:  Positive for constipation and nausea. Negative for blood in stool, diarrhea and vomiting.  Neurological:  Positive for dizziness. Negative for headaches.  Psychiatric/Behavioral:  The patient is nervous/anxious.       Objective:    BP 136/88   Pulse 62   Temp (!) 97.4 F (36.3 C) (Oral)   Ht 5' 2 (1.575 m)   Wt 190 lb 1.6 oz (86.2 kg)   LMP  (LMP Unknown)   SpO2 95%   BMI 34.77 kg/m  BP Readings from Last 3 Encounters:  04/03/24 136/88  03/14/24 122/81  02/03/24 122/78   Wt Readings from Last 3 Encounters:  04/03/24 190 lb 1.6 oz (86.2 kg)  03/14/24 191 lb (86.6 kg)  02/03/24 202 lb (91.6 kg)      02/03/2024    3:15 PM 12/15/2023   10:19 AM 09/29/2023   12:27 PM  Depression screen PHQ 2/9  Decreased Interest 2 0 1  Down, Depressed, Hopeless 2 0 0  PHQ - 2 Score 4 0 1  Altered sleeping 1  0  Tired, decreased energy 2  1  Change in appetite 2  0  Feeling bad or failure about yourself  1  0  Trouble concentrating 1  0  Moving slowly or fidgety/restless 2  0  Suicidal thoughts 0  0  PHQ-9 Score 13   2   Difficult doing work/chores Not difficult at all  Not difficult at all     Data saved with a previous flowsheet row definition      02/03/2024    3:16 PM 09/29/2023   12:27 PM 04/01/2023   11:53 AM 03/11/2023    3:01 PM  GAD  7 : Generalized Anxiety Score  Nervous, Anxious, on Edge 0 1 0 2  Control/stop worrying 0 1 0 3  Worry too much - different things 0 1 0 3  Trouble relaxing 0 1 0 2  Restless 0 1 0 0  Easily annoyed or irritable 0 1 0 3  Afraid - awful might happen 0 0 0 2  Total GAD 7 Score 0 6 0 15  Anxiety Difficulty Not difficult at all Not difficult at all Not difficult at all Very  difficult     Physical Exam Vitals reviewed.  Constitutional:      Appearance: She is well-developed.  Eyes:     Conjunctiva/sclera: Conjunctivae normal.  Cardiovascular:     Rate and Rhythm: Normal rate and regular rhythm.     Pulses: Normal pulses.     Heart sounds: Normal heart sounds.  Pulmonary:     Effort: Pulmonary effort is normal.     Breath sounds: Normal breath sounds. No wheezing, rhonchi or rales.  Skin:    General: Skin is warm and dry.  Neurological:     Mental Status: She is alert.  Psychiatric:        Speech: Speech normal.        Behavior: Behavior normal.        Thought Content: Thought content normal.

## 2024-03-30 NOTE — Patient Instructions (Addendum)
 I placed a referral to hematology for further evaluation for enlarged spleen.  Let us  know if you dont hear back within 2 weeks in regards to an appointment being scheduled.   So that you are aware, if you are [facility] MyChart user , please pay attention to your MyChart messages as you may receive a MyChart message with a phone number to call and schedule this test/appointment own your own from our referral coordinator. This is a new process so I do not want you to miss this message.  If you are not a MyChart user, you will receive a phone call.    As discussed, please do not restart Zepbound ; I do not find the benefits outweigh the risk.  Your quality life is far more important. Nice seeing you today, as always

## 2024-03-30 NOTE — Telephone Encounter (Signed)
  FYI Only or Action Required?: Action required by provider: clinical question for provider and update on patient condition.  Patient was last seen in primary care on 03/30/2024 by Dineen Rollene MATSU, FNP.  Called Nurse Triage reporting Medication Problem.  Symptoms began today.  Interventions attempted: Nothing.  Symptoms are: unchanged.  Triage Disposition: See PCP When Office is Open (Within 3 Days)  Patient/caregiver understands and will follow disposition?: No Copied from CRM #[id number]. Topic: Clinical - Medication Question >> Mar 30, 2024  4:24 PM Nessti S wrote: Reason for CRM: pt called because linzess has caused dizziness, panic attacks and diarrhea. Wants to know what pcp would like her to do with the linzess. Because she doesn't want to continue taking it if it causes those symptoms. Call back number [phone] Reason for Disposition  [1] MODERATE dizziness (e.g., vertigo; feels very unsteady, interferes with normal activities) AND [2] has been evaluated by doctor (or NP/PA) for this  Answer Assessment - Initial Assessment Questions 1. NAME of MEDICINE: What medicine(s) are you calling about?     linzess 2. QUESTION: What is your question? (e.g., double dose of medicine, side effect)    Should patient continue Linzess, or is this a side effect related to her seratonin/gut sensitivity 3. PRESCRIBER: Who prescribed the medicine? Reason: if prescribed by specialist, call should be referred to that group.    GI MD,  4. SYMPTOMS: Do you have any symptoms? If Yes, ask: What symptoms are you having?  How bad are the symptoms (e.g., mild, moderate, severe)     dizziness, panic attacks, and diarrhea (35-40 minutes after taking first dose of Linzess  Answer Assessment - Initial Assessment Questions 1. DESCRIPTION: Describe your dizziness.     Feels like she is going to fall down 2. VERTIGO: Do you feel like either you or the room is spinning or tilting?       spinning 3. LIGHTHEADED: Do you feel lightheaded? (e.g., somewhat faint, woozy, weak upon standing)     denies 4. SEVERITY: How bad is it?  Can you walk?     Able to walk, but feels like she will fall down 5. ONSET:  When did the dizziness begin?     This morning 6. AGGRAVATING FACTORS: Does anything make it worse? (e.g., standing, change in head position)     standing 7. CAUSE: What do you think is causing the dizziness?     Woke up with vertigo symptoms this morning, worsening after taking linzess Patient also says that she has vestibular disturbances that may have been aggravated by looking up taking photos of aurora borealis 8. RECURRENT SYMPTOM: Have you had dizziness before? If Yes, ask: When was the last time? What happened that time?     Yes, has had vertigo in the past 9. OTHER SYMPTOMS: Do you have any other symptoms? (e.g., earache, headache, numbness, tinnitus, vomiting, weakness)     Denies all of these symptoms including tingling, numbness,or weakness  Protocols used: Medication Question Call-A-AH, Dizziness - Vertigo-A-AH

## 2024-03-30 NOTE — Telephone Encounter (Signed)
 FYI Only or Action Required?: Action required by provider: clinical question for provider and update on patient condition.  Patient was last seen in primary care on 03/30/2024 by Dineen Rollene MATSU, FNP.  Called Nurse Triage reporting Anxiety.  Symptoms began today.  Interventions attempted: Rest, hydration, or home remedies.  Symptoms are: gradually improving.  Triage Disposition: See PCP When Office is Open (Within 3 Days)  Patient/caregiver understands and will follow disposition?: No, wishes to speak with PCP  Copied from CRM #[id number]. Topic: Clinical - Red Word Triage >> Mar 30, 2024  3:48 PM Rosina BIRCH wrote: Red Word that prompted transfer to Nurse Triage: patient called stating she saw NP Arnett today, but the GI doctor prescribed her Linzess last week and she did not want to take the medicine until she spoke with NP Arnett. Patient stated Arnett told her to take the medication. Patient stated she had an anxiety attack 45 minutes after she took the medicine and then 15 minutes after she had a severe panic attack like she was about to fall into the floor but she fell into the microwave. Patient stated she does not take her anxiety medication until four. Patient stated about five minutes after she had a physical bowel movement. Patient stated it was a full bowel movement Reason for Disposition  MODERATE anxiety (e.g., persistent or frequent anxiety symptoms; interferes with sleep, school, or work)  Answer Assessment - Initial Assessment Questions Patient is concerned with the anxiety attacks she had today after taking the Linzess. Patient is asking for a follow up call from provider.   1. CONCERN: Did anything happen that prompted you to call today?      Patient reports she has two anxiety attacks today after taking Linzess.  2. ANXIETY SYMPTOMS: Can you describe how you (your loved one; patient) have been feeling? (e.g., tense, restless, panicky, anxious, keyed up, overwhelmed,  sense of impending doom).      No symptoms 3. ONSET: How long have you been feeling this way? (e.g., hours, days, weeks)     Started with Zepbound .  4. SEVERITY: How would you rate the level of anxiety? (e.g., 0 - 10; or mild, moderate, severe).     When it happens moderate to severe.  5. FUNCTIONAL IMPAIRMENT: How have these feelings affected your ability to do daily activities? Have you had more difficulty than usual doing your normal daily activities? (e.g., getting better, same, worse; self-care, school, work, interactions)     no 6. HISTORY: Have you felt this way before? Have you ever been diagnosed with an anxiety problem in the past? (e.g., generalized anxiety disorder, panic attacks, PTSD). If Yes, ask: How was this problem treated? (e.g., medicines, counseling, etc.)     yes 7. RISK OF HARM - SUICIDAL IDEATION: Do you ever have thoughts of hurting or killing yourself? If Yes, ask:  Do you have these feelings now? Do you have a plan on how you would do this?     no 8. TREATMENT:  What has been done so far to treat this anxiety? (e.g., medicines, relaxation strategies). What has helped?     medicines 9. OTHER SYMPTOMS: Do you have any other symptoms? (e.g., feeling depressed, trouble concentrating, trouble sleeping, trouble breathing, palpitations or fast heartbeat, chest pain, sweating, nausea, or diarrhea)       Dizziness, felt like she was going to fall.  Protocols used: Anxiety and Panic Attack-A-AH

## 2024-03-31 ENCOUNTER — Other Ambulatory Visit: Payer: Self-pay | Admitting: Family

## 2024-03-31 DIAGNOSIS — K5909 Other constipation: Secondary | ICD-10-CM

## 2024-03-31 MED ORDER — LACTULOSE 20 GM/30ML PO SOLN: ORAL | 3 refills | Status: AC

## 2024-03-31 NOTE — Telephone Encounter (Signed)
 Provider has responded via my chart and pt has seen message as well

## 2024-03-31 NOTE — Telephone Encounter (Signed)
 I have sent a My Chart message.

## 2024-04-03 ENCOUNTER — Other Ambulatory Visit: Payer: Self-pay | Admitting: Family

## 2024-04-03 DIAGNOSIS — R42 Dizziness and giddiness: Secondary | ICD-10-CM | POA: Diagnosis not present

## 2024-04-03 MED ORDER — ACCU-CHEK SOFTCLIX LANCETS MISC: 12 refills | Status: AC

## 2024-04-03 MED ORDER — ACCU-CHEK AVIVA PLUS W/DEVICE KIT: PACK | 0 refills | Status: AC

## 2024-04-03 MED ORDER — GLUCOSE BLOOD VI STRP: ORAL_STRIP | 12 refills | Status: AC

## 2024-04-03 NOTE — Assessment & Plan Note (Addendum)
 Acute on chronic constipation.  Suspect Zepbound  is exacerbated.  Reviewed Dr. Williemae note.  Advised patient to trial Linzess to see if effective.  Advised not to try milk of magnesia at this time.  She may continue Colace.  May continue MiraLAX  if Linzess is not effective per Dr. Therisa.  Advised to reintroduce fiber foods including oatmeal as previously helpful for managing constipation

## 2024-04-03 NOTE — Assessment & Plan Note (Signed)
 Discussed constellation of symptoms and associated with timing of Zepbound  and side effects.  Again reiterated to hold any further use of Zepbound  at this time, likely indefinitely.  Concerned with degree of side effects, loss of appetite.  Encouraged to slowly advance diet.  Close monitoring

## 2024-04-03 NOTE — Assessment & Plan Note (Signed)
 Chronic, episodic.  Reviewed MRI 01/2024 .continue vestibular rehab therapy.  Will follow

## 2024-04-05 ENCOUNTER — Telehealth: Payer: Self-pay | Admitting: Family

## 2024-04-05 NOTE — Telephone Encounter (Signed)
 Ref team What is status of hematology consult placed 03/30/2024.  Per patient MyChart note fax number to [facility] hematology is [phone].  Please inform patient of status.  Please see her note below   Dear Brittney Ruiz,  Just reached out to [facility] Hemetology Dept for [facility] referral for enlarged spleen workup, due to flank pain and  from referral to you by Marval Salt from [facility]  CT scan of lung nodules clinic findings, as we discuss last week, to schedule appt and they do not have a referral from you as of yet.  They said please make sure you faced referral to correct number of [phone]. Please advise and thank you so much for your time.    God bless, Brittney Ruiz

## 2024-04-06 ENCOUNTER — Ambulatory Visit: Admitting: Family

## 2024-04-10 ENCOUNTER — Ambulatory Visit: Payer: Medicare HMO | Admitting: Dermatology

## 2024-04-10 ENCOUNTER — Encounter: Payer: Self-pay | Admitting: Dermatology

## 2024-04-10 DIAGNOSIS — L821 Other seborrheic keratosis: Secondary | ICD-10-CM | POA: Diagnosis not present

## 2024-04-10 DIAGNOSIS — L814 Other melanin hyperpigmentation: Secondary | ICD-10-CM

## 2024-04-10 DIAGNOSIS — Z1283 Encounter for screening for malignant neoplasm of skin: Secondary | ICD-10-CM

## 2024-04-10 DIAGNOSIS — L720 Epidermal cyst: Secondary | ICD-10-CM

## 2024-04-10 DIAGNOSIS — Z85820 Personal history of malignant melanoma of skin: Secondary | ICD-10-CM | POA: Diagnosis not present

## 2024-04-10 DIAGNOSIS — D485 Neoplasm of uncertain behavior of skin: Secondary | ICD-10-CM

## 2024-04-10 DIAGNOSIS — D229 Melanocytic nevi, unspecified: Secondary | ICD-10-CM

## 2024-04-10 DIAGNOSIS — L578 Other skin changes due to chronic exposure to nonionizing radiation: Secondary | ICD-10-CM | POA: Diagnosis not present

## 2024-04-10 DIAGNOSIS — D239 Other benign neoplasm of skin, unspecified: Secondary | ICD-10-CM

## 2024-04-10 DIAGNOSIS — L820 Inflamed seborrheic keratosis: Secondary | ICD-10-CM

## 2024-04-10 DIAGNOSIS — L304 Erythema intertrigo: Secondary | ICD-10-CM | POA: Diagnosis not present

## 2024-04-10 DIAGNOSIS — D1801 Hemangioma of skin and subcutaneous tissue: Secondary | ICD-10-CM | POA: Diagnosis not present

## 2024-04-10 DIAGNOSIS — L570 Actinic keratosis: Secondary | ICD-10-CM | POA: Diagnosis not present

## 2024-04-10 DIAGNOSIS — W908XXA Exposure to other nonionizing radiation, initial encounter: Secondary | ICD-10-CM | POA: Diagnosis not present

## 2024-04-10 NOTE — Patient Instructions (Addendum)

## 2024-04-10 NOTE — Progress Notes (Signed)
 Follow-Up Visit   Subjective  Brittney Ruiz is a 58 y.o. female who presents for the following: Skin Cancer Screening and Full Body Skin Exam  The patient presents for Total-Body Skin Exam (TBSE) for skin cancer screening and mole check. The patient has spots, moles and lesions to be evaluated, some may be new or changing and the patient may have concern these could be cancer.  Pt c/o new skin lesion on the R chest that she would like discussed today.  The following portions of the chart were reviewed this encounter and updated as appropriate: medications, allergies, medical history  Review of Systems:  No other skin or systemic complaints except as noted in HPI or Assessment and Plan.  Objective  Well appearing patient in no apparent distress; mood and affect are within normal limits.  A full examination was performed including scalp, head, eyes, ears, nose, lips, neck, chest, axillae, abdomen, back, buttocks, bilateral upper extremities, bilateral lower extremities, hands, feet, fingers, toes, fingernails, and toenails. All findings within normal limits unless otherwise noted below.   Relevant physical exam findings are noted in the Assessment and Plan.  Right chest 1.1 cm pink macule with telangiectasias   Assessment & Plan   SKIN CANCER SCREENING PERFORMED TODAY.  ACTINIC DAMAGE - Chronic condition, secondary to cumulative UV/sun exposure - diffuse scaly erythematous macules with underlying dyspigmentation - Recommend daily broad spectrum sunscreen SPF 30+ to sun-exposed areas, reapply every 2 hours as needed.  - Staying in the shade or wearing long sleeves, sun glasses (UVA+UVB protection) and wide brim hats (4-inch brim around the entire circumference of the hat) are also recommended for sun protection.  - Call for new or changing lesions.  LENTIGINES, SEBORRHEIC KERATOSES, HEMANGIOMAS - Benign normal skin lesions - Benign-appearing - Call for any changes  MELANOCYTIC  NEVI - Tan-brown and/or pink-flesh-colored symmetric macules and papules - Benign appearing on exam today - Observation - Call clinic for new or changing moles - Recommend daily use of broad spectrum spf 30+ sunscreen to sun-exposed areas.   HISTORY OF MELANOMA - Right foot s/p lymph node removed right groin. Breslow's 1.1 mm. Clark's level IV. Excision/sentinel node biopsy Dr. Tommas at [facility].  - No evidence of recurrence today - No lymphadenopathy - Recommend regular full body skin exams - Recommend daily broad spectrum sunscreen SPF 30+ to sun-exposed areas, reapply every 2 hours as needed.  - Call if any new or changing lesions are noted between office visits  Milia - tiny firm white papules - type of cyst - benign - sometimes these will clear with nightly OTC adapalene/Differin 0.1% gel or retinol. - may be extracted if symptomatic - observe  NEOPLASM OF UNCERTAIN BEHAVIOR OF SKIN Right chest Skin / nail biopsy Type of biopsy: tangential   Informed consent: discussed and consent obtained   Timeout: patient name, date of birth, surgical site, and procedure verified   Procedure prep:  Patient was prepped and draped in usual sterile fashion Prep type:  Isopropyl alcohol  Anesthesia: the lesion was anesthetized in a standard fashion   Anesthetic:  1% lidocaine  plain local infiltration Instrument used: DermaBlade   Hemostasis achieved with: pressure and aluminum chloride   Outcome: patient tolerated procedure well   Post-procedure details: sterile dressing applied and wound care instructions given   Dressing type: bandage and petrolatum    Specimen 1 - Surgical pathology Differential Diagnosis: inflamed SK vs BCC vs lymphoma  Check Margins: No MULTIPLE BENIGN NEVI   LENTIGINES  ACTINIC ELASTOSIS   SEBORRHEIC KERATOSES   CHERRY ANGIOMA   DERMATOFIBROMA   MILIA    INTERTRIGO Exam: Erythematous macerated patches in body folds, inframammary  Chronic and  persistent condition with duration or expected duration over one year. Condition is symptomatic/ bothersome to patient. Not currently at goal.  Intertrigo is a chronic recurrent rash that occurs in skin fold areas that may be associated with friction; heat; moisture; yeast; fungus; and bacteria.  It is exacerbated by increased movement / activity; sweating; and higher atmospheric temperature.  Use of an absorbant powder such as Zeasorb AF powder or other OTC antifungal powder to the area daily can prevent rash recurrence. Other options to help keep the area dry include blow drying the area after bathing or using antiperspirant products such as Duradry sweat minimizing gel.  Treatment Plan: Pt has tried numerous medications prescription and OTC. No need to tx if not bothersome. May continue ketoconazole  2% cream, HC 2.5% cream, and Zeasorb AF powder daily as needed.   Return in about 1 year (around 04/10/2025) for TBSE - hx MM.  LILLETTE Rosina Mayans, CMA, am acting as scribe for Boneta Sharps, MD .   Documentation: I have reviewed the above documentation for accuracy and completeness, and I agree with the above.  Boneta Sharps, MD

## 2024-04-11 ENCOUNTER — Ambulatory Visit: Payer: Self-pay | Admitting: Dermatology

## 2024-04-11 ENCOUNTER — Telehealth (INDEPENDENT_AMBULATORY_CARE_PROVIDER_SITE_OTHER): Admitting: Psychology

## 2024-04-11 DIAGNOSIS — F32A Depression, unspecified: Secondary | ICD-10-CM | POA: Diagnosis not present

## 2024-04-11 DIAGNOSIS — F419 Anxiety disorder, unspecified: Secondary | ICD-10-CM | POA: Diagnosis not present

## 2024-04-11 DIAGNOSIS — F5089 Other specified eating disorder: Secondary | ICD-10-CM

## 2024-04-11 DIAGNOSIS — R161 Splenomegaly, not elsewhere classified: Secondary | ICD-10-CM | POA: Diagnosis not present

## 2024-04-11 LAB — SURGICAL PATHOLOGY

## 2024-04-11 NOTE — Telephone Encounter (Signed)
Pt has been notified via My chart

## 2024-04-11 NOTE — Progress Notes (Signed)
  Office: [phone]  /  Fax: [phone]    Date: April 11, 2024  Appointment Start Time: 12:30pm Duration: 33 minutes Provider: Wyatt Fire, Psy.D. Type of Session: Individual Therapy  Location of Patient: Home (private location) Location of Provider: Provider's Home (private office) Type of Contact: Telepsychological Visit via MyChart Video Visit  Session Content: Brittney Ruiz is a 58 y.o. female presenting for a follow-up appointment to address the previously established treatment goal of increasing coping skills. Today's appointment was a telepsychological visit. [name] provided verbal consent for today's telepsychological appointment and she is aware she is responsible for securing confidentiality on her end of the session. Prior to proceeding with today's appointment, [name]'s physical location at the time of this appointment was obtained as well a phone number she could be reached at in the event of technical difficulties. [name] and this provider participated in today's telepsychological service.   This provider conducted a brief check-in. [name] described ongoing side effects from Zepbound , noting some improvement and increase in appetite. She shared about her recent appointment with her PCP regarding side effects and low caloric intake, adding she has an appointment with the cancer center today. [name] reported, It has been very overwhelming as it relates to ongoing medical issues, decreased appetite, and stalled weight loss. The aforementioned was further processed and she acknowledged experiencing all or nothing thinking. Remainder of today's appointment focused on thought challenging by exploring evidence for and against thoughts to help develop a more balanced thought. Overall,  [name] was receptive to today's appointment as evidenced by openness to sharing, responsiveness to feedback, and willingness to implement discussed strategies .  Mental Status Examination:  Appearance: neat Behavior:  appropriate to circumstances Mood: neutral Affect: mood congruent Speech: WNL Eye Contact: appropriate Psychomotor Activity: WNL Gait: unable to assess Thought Process: linear, logical, and goal directed and no evidence or endorsement of suicidal, homicidal, and self-harm ideation, plan and intent  Thought Content/Perception: no hallucinations, delusions, bizarre thinking or behavior endorsed or observed Orientation: AAOx4 Memory/Concentration: intact Insight: fair Judgment: fair  Interventions:  Conducted a brief chart review Provided empathic reflections and validation Reviewed content from the previous session Provided positive reinforcement Employed supportive psychotherapy interventions to facilitate reduced distress and to improve coping skills with identified stressors Engaged patient in thought challenging/cognitive restructuring   DSM-5 Diagnosis(es): F50.89 Other Specified Feeding or Eating Disorder, Emotional Eating Behaviors, F41.9 Unspecified Anxiety Disorder, and  F32.A Unspecified Depressive Disorder  Treatment Goal & Progress: During the initial appointment with this provider, the following treatment goal was established: increase coping skills. [name] has demonstrated progress in her goal as evidenced by increased awareness of hunger patterns and increased awareness of triggers for emotional eating behaviors. [name] also continues to demonstrate willingness to engage in learned skill(s).   Plan: The next appointment is scheduled for 04/18/2024 at 12:30pm, which will be via MyChart Video Visit. The next session will focus on working towards the established treatment goal. [name] will continue with her behavioral health provider with [facility].    Wyatt Fire, PsyD

## 2024-04-12 DIAGNOSIS — Z1159 Encounter for screening for other viral diseases: Secondary | ICD-10-CM | POA: Diagnosis not present

## 2024-04-12 DIAGNOSIS — R7303 Prediabetes: Secondary | ICD-10-CM | POA: Diagnosis not present

## 2024-04-12 DIAGNOSIS — K7581 Nonalcoholic steatohepatitis (NASH): Secondary | ICD-10-CM | POA: Diagnosis not present

## 2024-04-13 ENCOUNTER — Encounter: Payer: Self-pay | Admitting: Family

## 2024-04-17 DIAGNOSIS — J45909 Unspecified asthma, uncomplicated: Secondary | ICD-10-CM | POA: Diagnosis not present

## 2024-04-17 DIAGNOSIS — E66811 Obesity, class 1: Secondary | ICD-10-CM | POA: Diagnosis not present

## 2024-04-17 DIAGNOSIS — I10 Essential (primary) hypertension: Secondary | ICD-10-CM | POA: Diagnosis not present

## 2024-04-17 DIAGNOSIS — G43909 Migraine, unspecified, not intractable, without status migrainosus: Secondary | ICD-10-CM | POA: Diagnosis not present

## 2024-04-17 DIAGNOSIS — E785 Hyperlipidemia, unspecified: Secondary | ICD-10-CM | POA: Diagnosis not present

## 2024-04-17 DIAGNOSIS — K7581 Nonalcoholic steatohepatitis (NASH): Secondary | ICD-10-CM | POA: Diagnosis not present

## 2024-04-17 DIAGNOSIS — K219 Gastro-esophageal reflux disease without esophagitis: Secondary | ICD-10-CM | POA: Diagnosis not present

## 2024-04-18 ENCOUNTER — Telehealth (INDEPENDENT_AMBULATORY_CARE_PROVIDER_SITE_OTHER): Admitting: Psychology

## 2024-04-18 ENCOUNTER — Telehealth (INDEPENDENT_AMBULATORY_CARE_PROVIDER_SITE_OTHER): Payer: Self-pay | Admitting: Physician Assistant

## 2024-04-18 ENCOUNTER — Encounter (INDEPENDENT_AMBULATORY_CARE_PROVIDER_SITE_OTHER): Payer: Self-pay | Admitting: Physician Assistant

## 2024-04-18 VITALS — Ht 62.0 in | Wt 188.8 lb

## 2024-04-18 DIAGNOSIS — Z6834 Body mass index (BMI) 34.0-34.9, adult: Secondary | ICD-10-CM | POA: Diagnosis not present

## 2024-04-18 DIAGNOSIS — E669 Obesity, unspecified: Secondary | ICD-10-CM | POA: Diagnosis not present

## 2024-04-18 DIAGNOSIS — F32A Depression, unspecified: Secondary | ICD-10-CM | POA: Diagnosis not present

## 2024-04-18 DIAGNOSIS — K5901 Slow transit constipation: Secondary | ICD-10-CM | POA: Diagnosis not present

## 2024-04-18 DIAGNOSIS — K760 Fatty (change of) liver, not elsewhere classified: Secondary | ICD-10-CM | POA: Diagnosis not present

## 2024-04-18 DIAGNOSIS — R7303 Prediabetes: Secondary | ICD-10-CM | POA: Diagnosis not present

## 2024-04-18 DIAGNOSIS — F419 Anxiety disorder, unspecified: Secondary | ICD-10-CM | POA: Diagnosis not present

## 2024-04-18 DIAGNOSIS — F5089 Other specified eating disorder: Secondary | ICD-10-CM | POA: Diagnosis not present

## 2024-04-18 DIAGNOSIS — R161 Splenomegaly, not elsewhere classified: Secondary | ICD-10-CM

## 2024-04-18 NOTE — Progress Notes (Signed)
  Office: [phone]  /  Fax: [phone]    Date: April 18, 2024  Appointment Start Time: 12:27pm Duration: 32 minutes Provider: Wyatt Fire, Psy.D. Type of Session: Individual Therapy  Location of Patient: Home (private location) Location of Provider: Provider's Home (private office) Type of Contact: Telepsychological Visit via MyChart Video Visit  Session Content: Brittney Ruiz is a 58 y.o. female presenting for a follow-up appointment to address the previously established treatment goal of increasing coping skills. Today's appointment was a telepsychological visit. Brittney Ruiz provided verbal consent for today's telepsychological appointment and she is aware she is responsible for securing confidentiality on her end of the session. Prior to proceeding with today's appointment, Brittney Ruiz's physical location at the time of this appointment was obtained as well a phone number she could be reached at in the event of technical difficulties. Brittney Ruiz and this provider participated in today's telepsychological service.   This provider conducted a brief check-in. Brittney Ruiz shared she continues to experience GI issues, which she attributes to Zepbound . She further shared about her recent medical appointments, noting many labs were elevated and further work up is needed to determine cause of enlarged spleen and associated pain. Associated thoughts and feelings were processed. Brittney Ruiz also continues to report low appetite; however, she described eating well during Thanksgiving. She continues to experience confusion regarding what she can eat as different specialists are reportedly telling her different things leading to feelings of overwhelm and not eating. It was recommended she discuss this concern with her PCP. Brittney Ruiz expressed ambivalence due to previous issues with her PCP. Thus, she was engaged in problem solving to address the abovementioned concern. She noted a plan to discuss diet further with her GI provider and to assist  with this conversation, this provider assisted Brittney Ruiz in making a list of the different diet recommendations she has received to date. She also agreed to take handouts with her to the appointment with GI provider next week. Overall, Brittney Ruiz was receptive to today's appointment as evidenced by openness to sharing, responsiveness to feedback, and willingness to implement discussed strategies .  Mental Status Examination:  Appearance: neat Behavior: appropriate to circumstances Mood: neutral Affect: mood congruent Speech: WNL Eye Contact: appropriate Psychomotor Activity: WNL Gait: unable to assess Thought Process: linear, logical, and goal directed and no evidence or endorsement of suicidal, homicidal, and self-harm ideation, plan and intent  Thought Content/Perception: no hallucinations, delusions, bizarre thinking or behavior endorsed or observed Orientation: AAOx4 Memory/Concentration: intact Insight: fair Judgment: fair  Interventions:  Conducted a brief chart review Provided empathic reflections and validation Provided positive reinforcement Employed supportive psychotherapy interventions to facilitate reduced distress and to improve coping skills with identified stressors Engaged patient in problem solving  DSM-5 Diagnosis(es): F50.89 Other Specified Feeding or Eating Disorder, Emotional Eating Behaviors, F41.9 Unspecified Anxiety Disorder, and  F32.A Unspecified Depressive Disorder  Treatment Goal & Progress: During the initial appointment with this provider, the following treatment goal was established: increase coping skills. Brittney Ruiz has demonstrated progress in her goal as evidenced by increased awareness of hunger patterns and increased awareness of triggers for emotional eating behaviors. Brittney Ruiz also continues to demonstrate willingness to engage in learned skill(s).   Plan: The next appointment is scheduled for 05/01/2024 at 2pm, which will be via MyChart Video Visit. The next session  will focus on working towards the established treatment goal and termination. Brittney Ruiz will continue with her behavioral health provider with [facility].      Wyatt Fire, PsyD

## 2024-04-18 NOTE — Progress Notes (Signed)
 TeleHealth Visit:  This visit was completed with telemedicine (audio/video) technology. Brittney Ruiz has verbally consented to this TeleHealth visit. The patient is located at home, the provider is located at the [facility] office in [place]. The participants in this visit include the listed provider and patient. The visit was conducted today via MyChart video.  SUBJECTIVE: Discussed the use of AI scribe software for clinical note transcription with the patient, who gave verbal consent to proceed.  Chief Complaint: Obesity  Interim History: She is seen by video visit today due to GI upset.                           Reported weight today is 188 lbs. - Last in OV weight 191 lbs.   Brittney Ruiz is here to discuss her progress with her obesity treatment plan. She is on the Category 1 Plan and states she is not following her eating plan as she is continuing to have GI upset after stopping Zepbound  nearly 4 weeks ago.   She states she is exercising chair exercises 15 minutes 5 times per week.  Brittney Ruiz is a 58 year old female with obesity, hepatic steatosis, obstructive sleep apnea, and prediabetes who presents for follow-up of her obesity treatment plan.  She experiences gastrointestinal issues, including intermittent constipation, bloating, and lack of appetite. Linzess was recommended but discontinued due to panic attacks. Bowel movements are described as 'little butter beans or little pencil sticks.' She is being followed by gastroenterology for these issues.  She was started on Zepbound  for hepatic steatosis, obstructive sleep apnea, and prediabetes but experienced severe side effects, including brain fog, fatigue, inability to concentrate, sleep disturbances, and emotional distress. She discontinued the medication four weeks ago but still lacks appetite and struggles to meet her nutritional goals, consuming only about 600 calories a day.       She was following a category one meal plan, tracking  calories and macros, but struggles with dietary recommendations from multiple doctors. Conflicting advice includes low FODMAP, high fiber, and Mediterranean diets.   Currently, she consumes Premier protein shakes and small amounts of food, such as homemade muffins, but has aversions to certain foods and difficulty with dairy products.  She reports an enlarged spleen was discovered during a scan for a lung nodule. She reports pain in her left side and back. She was referred to hematology and oncology and was referred to General surgery for removal of her spleen, but she does not want surgery currently.   She experiences anxiety and panic attacks, which she associates with medication changes. Her anxiety is generally controlled with medication, but symptoms increased with Linzess and Zepbound . She reports a history of sensitivity to medications which she feels is affecting her serotonin and gut-brain interactions.  She drinks adequate water and attempts to maintain nutrition with Premier protein shakes, prune juice, and small meals. She reports sleeping seven to nine hours per night.  Medical hx:      Low back pain/DDD                         Plantar fasciitis            `                           Meningioma- with visual changes  Lung nodule                                   Multiple medication intolerance/allergies.                              Anxiety/Depression OBJECTIVE: Visit Diagnoses: Problem List Items Addressed This Visit     Generalized obesity   Hepatic steatosis   Prediabetes   Other Visit Diagnoses       Slow transit constipation    -  Primary     Splenomegaly ?         Obesity Management complicated by intolerance to Zepbound  due to severe gastrointestinal and psychiatric side effects, including brain fog, panic attacks, and depression.  Current dietary management is challenging due to conflicting dietary recommendations from multiple  specialists. HWW has recommended category one meal plan based on REE of 1570 calories on her last indirect calorimetry in July 2025. She has been tracking calories and macros, but struggles with appetite and food intake following starting Zepbound . - Continue Premier protein shakes for nutrition when unable to eat otherwise. -  Once eating solids again-Maintain a calorie  1100 calories per day with at least 75 grams of protein. - Follow up with gastroenterologist for further evaluation and management.  Slow transit constipation Chronic constipation with intermittent episodes of bloating and incomplete bowel movements. Symptoms exacerbated by Zepbound , which slowed gastrointestinal transit. Current management includes Linzess, but she experienced panic attacks with its use and stopped taking, but also coincided with starting Zepbound . ? Some degree of gastroparesis prior to starting Zepbound .  She is advised to increase fiber intake, but conflicting dietary recommendations complicate management. - Increase fiber intake as tolerated but ensure adequate water intake- at least 80 oz daily as increases fiber intake.  - Follow up with gastroenterologist for further evaluation, including potential endoscopy/colonoscopy if felt indicated and CT scan of the abdomen and pelvis if felt indicated.  Hepatic steatosis We recommended category one meal plan and tracking calories and macros. Conflicting dietary recommendations from multiple specialists complicate management. Had fibroscan with GI which showed no to minimal scarring. LFT's are normal.  - Continue category one meal plan with focus on whole foods and adequate protein intake when tolerating po's better, okay to use protein shakes in interim due to poor tolerance to many foods currently. - Follow up with gastroenterologist for further evaluation.   Prediabetes Managed with dietary modifications and weight management. Category one meal plan and tracking  calories and macros, but not able to tolerate po well now. Conflicting dietary recommendations from multiple specialists complicate management. Lab Results  Component Value Date   HGBA1C 5.5 11/15/2023   HGBA1C 5.6 02/04/2023   HGBA1C 5.7 (H) 07/30/2022   Lab Results  Component Value Date   LDLCALC 116 (H) 11/15/2023   CREATININE 0.77 11/15/2023   INSULIN   Date Value Ref Range Status  11/15/2023 12.1 2.6 - 24.9 uIU/mL Final  ]Continue working on nutrition plan to decrease simple carbohydrates, increase lean proteins and exercise to promote weight loss, improve glycemic control and prevent progression to Type 2 diabetes.  Last A1c normal range. Insulin  level remains minimally elevated.  - Continue category one meal plan with focus on whole foods and adequate protein intake once able to tolerate solids again.    Splenomegaly? Enlarged spleen noted on imaging, with associated left-sided  abdominal pain. Referral to hematology and oncology was made, but no plans for treatment at this point.   Concerns about potential causes of splenomegaly and its impact on gastrointestinal symptoms. - Follow up with gastroenterologist for further evaluation and management. May need formal CT ABD/pelvis to evaluate further.   No data recorded Anthropometric Measurements Height: 5' 2 (1.575 m) Weight: 188 lb 12.8 oz (85.6 kg) BMI (Calculated): 34.52 Weight at Last Visit: 190 lbs Weight Lost Since Last Visit: 2 lbs Weight Gained Since Last Visit: 0 Starting Weight: 217 lbs Total Weight Loss (lbs): 28 lb (12.7 kg)   No data recorded Other Clinical Data Fasting: N/A Labs: N/A Today's Visit #: 45 Starting Date: 06/01/19 Comments: mychart visit     ASSESSMENT AND PLAN:  Diet: Brittney Ruiz is not currently in the action stage of change. As such, her goal is to get back to weight loss efforts once her GI issues have been fully evaluated.   Exercise: Brittney Ruiz has been instructed to work up to a goal of  150 minutes of combined cardio and strengthening exercise per week and to continue exercising as is for weight loss and overall health benefits.   Behavior Modification:  We discussed the following Behavioral Modification Strategies today: increasing lean protein intake, decreasing simple carbohydrates, increasing vegetables, increase H2O intake, no skipping meals, meal planning and cooking strategies, avoiding temptations, planning for success, and She is undergoing GI evaluation and we discussed follow up once she has things clarified and is able to tolerate solid foods more consistently. . We discussed various medication options to help Brittney Ruiz with her weight loss efforts and we both agreed to meet once her GI status is clarified and we can proceed with more conventional nutrition plans.  Return in about 8 weeks (around 06/13/2024), or She will call back once her GI status is clarified... She was informed of the importance of frequent follow up visits to maximize her success with intensive lifestyle modifications for her multiple health conditions.  Attestation Statements:   Reviewed by clinician on day of visit: allergies, medications, problem list, medical history, surgical history, family history, social history, and previous encounter notes.   Time spent on visit including pre-visit chart review and post-visit care and charting was 45 minutes.    Leathie Weich, PA-C

## 2024-04-19 DIAGNOSIS — F321 Major depressive disorder, single episode, moderate: Secondary | ICD-10-CM | POA: Diagnosis not present

## 2024-04-24 ENCOUNTER — Telehealth (INDEPENDENT_AMBULATORY_CARE_PROVIDER_SITE_OTHER): Payer: Self-pay | Admitting: Physician Assistant

## 2024-04-24 NOTE — Telephone Encounter (Signed)
  Patient wanted to let Brittney Ruiz know that she still is not using the bathroom correctly and that she has an appointment to see a specialist about it on 12/10, She said that once that situation gets handled, she will be ready to reschedule with us .

## 2024-04-24 NOTE — Telephone Encounter (Signed)
 I called patient due to detailed mychart messages.   Pt concern is that she was sent to the oncology surgeon and was not referred to the correct Hematology dept. They need us  to call [facility] and explain what she needs to be seen for because they have now declined the referral.  I am forwarding this message to both the referral team and PCP in hopes that this can be resolved.  Pt also stated that she is willing to do a virtual visit in the afternoon if it is still necessary.

## 2024-04-24 NOTE — Telephone Encounter (Signed)
 Noted! Per Rollene: Dineen Rollene MATSU, FNP to [facility]     04/18/24  4:07 PM Call pt  Sch an appt to discuss concerns

## 2024-04-25 ENCOUNTER — Telehealth: Payer: Self-pay | Admitting: Family

## 2024-04-25 DIAGNOSIS — H52223 Regular astigmatism, bilateral: Secondary | ICD-10-CM | POA: Diagnosis not present

## 2024-04-25 DIAGNOSIS — H524 Presbyopia: Secondary | ICD-10-CM | POA: Diagnosis not present

## 2024-04-25 NOTE — Telephone Encounter (Signed)
 Spoke to pt she has been scheduled for 04/27/24 @ 12 30 pm for Virtual

## 2024-04-25 NOTE — Telephone Encounter (Signed)
 Pt has not read mychart message I would prefer her to be called Please sch an appt with me 1- 2 weeks Let me know once scheduled

## 2024-04-25 NOTE — Telephone Encounter (Signed)
 LVM to call back to see if pt is available for appt on Thursday 03/28/24 per Rollene

## 2024-04-26 DIAGNOSIS — R6881 Early satiety: Secondary | ICD-10-CM | POA: Diagnosis not present

## 2024-04-26 DIAGNOSIS — R194 Change in bowel habit: Secondary | ICD-10-CM | POA: Diagnosis not present

## 2024-04-26 DIAGNOSIS — R1084 Generalized abdominal pain: Secondary | ICD-10-CM | POA: Diagnosis not present

## 2024-04-27 ENCOUNTER — Telehealth: Admitting: Family

## 2024-04-27 ENCOUNTER — Encounter: Payer: Self-pay | Admitting: Family

## 2024-04-27 VITALS — Ht 62.0 in | Wt 188.8 lb

## 2024-04-27 DIAGNOSIS — R161 Splenomegaly, not elsewhere classified: Secondary | ICD-10-CM

## 2024-04-27 NOTE — Progress Notes (Unsigned)
 Virtual Visit via Video Note  I connected with Brittney Ruiz on 04/28/2024 at 12:30 PM EST by a video enabled telemedicine application and verified that I am speaking with the correct person using two identifiers. Location patient: home Location provider: work  Persons participating in the virtual visit: patient, provider  I discussed the limitations of evaluation and management by telemedicine and the availability of in person appointments. The patient expressed understanding and agreed to proceed.  HPI: Discussed the use of AI scribe software for clinical note transcription with the patient, who gave verbal consent to proceed.  History of Present Illness   Brittney Ruiz is a 58 year old female who presents for follow-up regarding splenomegaly.  She is experiencing difficulties coordinating care with [facility], with communication issues regarding her appointments. She has been in contact with [facility] to facilitate the process.  Scheduled with [facility] hematology Dr Margaretmary [facility] 09/18/24.   CT chest 11/7 /25 Splenomegaly and duodenal diverticuli, similar to prior.  No significant change in the two adjacent nodules in the right lower lobe  compared to 10/29/2022. These may represent sequelae of infection however  are indeterminate and can be reassessed on follow-up CT chest without  contrast in 6-12 months with goal of 2 year stability.    Follow up Dr Therisa, constipation Chronic constipation with small, pencil-like stools and floating stools. Symptoms persist despite current management. Previous colonoscopy and endoscopy were four years ago. Slight improvement after stopping Zepbound . - Increase Miralax  to two or three times a day. - Continue psyllium husk for fiber. - Ordered CT scan of the abdomen and pelvis to evaluate for blockages or abnormalities. - Scheduled endoscopy and colonoscopy to assess for potential narrowing or obstruction   ROS: See pertinent positives and negatives  per HPI.  EXAM:  VITALS per patient if applicable: Ht 5' 2 (1.575 m)   Wt 188 lb 12.8 oz (85.6 kg)   LMP  (LMP Unknown)   BMI 34.53 kg/m  BP Readings from Last 3 Encounters:  04/03/24 136/88  03/14/24 122/81  02/03/24 122/78   Wt Readings from Last 3 Encounters:  04/27/24 188 lb 12.8 oz (85.6 kg)  04/18/24 188 lb 12.8 oz (85.6 kg)  04/03/24 190 lb 1.6 oz (86.2 kg)    GENERAL: alert, oriented, appears well and in no acute distress  HEENT: atraumatic, conjunttiva clear, no obvious abnormalities on inspection of external nose and ears  NECK: normal movements of the head and neck  LUNGS: on inspection no signs of respiratory distress, breathing rate appears normal, no obvious gross SOB, gasping or wheezing  CV: no obvious cyanosis  MS: moves all visible extremities without noticeable abnormality  PSYCH/NEURO: pleasant and cooperative, no obvious depression or anxiety, speech and thought processing grossly intact  ASSESSMENT AND PLAN: Splenomegaly Assessment & Plan: scheduled with [facility] Dr Margaretmary Browning [facility] 09/18/24. Working on scheduling sooner appointment for full evaluation.  Pending CT a/p with Dr Therisa. Will follow.       -we discussed possible serious and likely etiologies, options for evaluation and workup, limitations of telemedicine visit vs in person visit, treatment, treatment risks and precautions. Pt prefers to treat via telemedicine empirically rather then risking or undertaking an in person visit at this moment.    I discussed the assessment and treatment plan with the patient. The patient was provided an opportunity to ask questions and all were answered. The patient agreed with the plan and demonstrated an understanding of the instructions.  The patient was advised to call back or seek an in-person evaluation if the symptoms worsen or if the condition fails to improve as anticipated.  Advised if desired AVS can be mailed or viewed via MyChart if  Mychart user.   Rollene Northern, FNP

## 2024-04-27 NOTE — Assessment & Plan Note (Addendum)
 scheduled with [facility] Dr Margaretmary Rosaline [facility] 09/18/24. Working on scheduling sooner appointment for full evaluation.  Pending CT a/p with Dr Therisa. Will follow.

## 2024-04-28 ENCOUNTER — Telehealth: Payer: Self-pay

## 2024-04-28 NOTE — Telephone Encounter (Signed)
 Mychart message sent per PCP about hematology referral. Awaiting pt response.

## 2024-04-28 NOTE — Patient Instructions (Addendum)
 We are working on moving up your appointment with [name] hematology.   I am certainly following along as well with Dr. Williemae thorough evaluation.

## 2024-05-01 ENCOUNTER — Telehealth (INDEPENDENT_AMBULATORY_CARE_PROVIDER_SITE_OTHER): Admitting: Psychology

## 2024-05-04 ENCOUNTER — Ambulatory Visit: Admitting: Anesthesiology

## 2024-05-04 ENCOUNTER — Other Ambulatory Visit: Payer: Self-pay

## 2024-05-04 ENCOUNTER — Encounter: Admission: RE | Disposition: A | Payer: Self-pay | Attending: Gastroenterology

## 2024-05-04 ENCOUNTER — Encounter: Payer: Self-pay | Admitting: Gastroenterology

## 2024-05-04 ENCOUNTER — Ambulatory Visit
Admission: RE | Admit: 2024-05-04 | Discharge: 2024-05-04 | Disposition: A | Discharge status: Home / Self Care | Attending: Gastroenterology | Admitting: Gastroenterology

## 2024-05-04 DIAGNOSIS — K2970 Gastritis, unspecified, without bleeding: Secondary | ICD-10-CM | POA: Diagnosis not present

## 2024-05-04 DIAGNOSIS — K3189 Other diseases of stomach and duodenum: Secondary | ICD-10-CM | POA: Diagnosis not present

## 2024-05-04 DIAGNOSIS — K5904 Chronic idiopathic constipation: Secondary | ICD-10-CM | POA: Insufficient documentation

## 2024-05-04 DIAGNOSIS — J4489 Other specified chronic obstructive pulmonary disease: Secondary | ICD-10-CM | POA: Insufficient documentation

## 2024-05-04 DIAGNOSIS — I10 Essential (primary) hypertension: Secondary | ICD-10-CM | POA: Diagnosis not present

## 2024-05-04 DIAGNOSIS — Z79899 Other long term (current) drug therapy: Secondary | ICD-10-CM | POA: Diagnosis not present

## 2024-05-04 DIAGNOSIS — Z5982 Transportation insecurity: Secondary | ICD-10-CM | POA: Diagnosis not present

## 2024-05-04 DIAGNOSIS — Z59868 Other specified financial insecurity: Secondary | ICD-10-CM | POA: Insufficient documentation

## 2024-05-04 DIAGNOSIS — Z8249 Family history of ischemic heart disease and other diseases of the circulatory system: Secondary | ICD-10-CM | POA: Insufficient documentation

## 2024-05-04 DIAGNOSIS — K640 First degree hemorrhoids: Secondary | ICD-10-CM | POA: Insufficient documentation

## 2024-05-04 DIAGNOSIS — F418 Other specified anxiety disorders: Secondary | ICD-10-CM | POA: Diagnosis not present

## 2024-05-04 DIAGNOSIS — K219 Gastro-esophageal reflux disease without esophagitis: Secondary | ICD-10-CM | POA: Diagnosis not present

## 2024-05-04 DIAGNOSIS — K5730 Diverticulosis of large intestine without perforation or abscess without bleeding: Secondary | ICD-10-CM | POA: Diagnosis not present

## 2024-05-04 DIAGNOSIS — G4730 Sleep apnea, unspecified: Secondary | ICD-10-CM | POA: Diagnosis not present

## 2024-05-04 DIAGNOSIS — R195 Other fecal abnormalities: Secondary | ICD-10-CM | POA: Diagnosis not present

## 2024-05-04 DIAGNOSIS — Z87891 Personal history of nicotine dependence: Secondary | ICD-10-CM | POA: Insufficient documentation

## 2024-05-04 DIAGNOSIS — K449 Diaphragmatic hernia without obstruction or gangrene: Secondary | ICD-10-CM | POA: Insufficient documentation

## 2024-05-04 DIAGNOSIS — K319 Disease of stomach and duodenum, unspecified: Secondary | ICD-10-CM | POA: Diagnosis not present

## 2024-05-04 DIAGNOSIS — R109 Unspecified abdominal pain: Secondary | ICD-10-CM | POA: Diagnosis not present

## 2024-05-04 DIAGNOSIS — K581 Irritable bowel syndrome with constipation: Secondary | ICD-10-CM | POA: Insufficient documentation

## 2024-05-04 HISTORY — PX: ESOPHAGOGASTRODUODENOSCOPY: SHX5428

## 2024-05-04 HISTORY — PX: COLONOSCOPY: SHX5424

## 2024-05-04 HISTORY — PX: RECTAL BIOPSY: SHX2303

## 2024-05-04 SURGERY — COLONOSCOPY
Anesthesia: General

## 2024-05-04 MED ORDER — LIDOCAINE HCL (CARDIAC) PF 100 MG/5ML IV SOSY
PREFILLED_SYRINGE | INTRAVENOUS | Status: DC | PRN
  Administered 2024-05-04: 11:00:00 100 mg via INTRAVENOUS

## 2024-05-04 MED ORDER — SODIUM CHLORIDE 0.9 % IV SOLN: INTRAVENOUS | Status: DC

## 2024-05-04 MED ORDER — PROPOFOL 500 MG/50ML IV EMUL
INTRAVENOUS | Status: DC | PRN
  Administered 2024-05-04: 11:00:00 135 ug/kg/min via INTRAVENOUS

## 2024-05-04 MED ORDER — PROPOFOL 10 MG/ML IV BOLUS
INTRAVENOUS | Status: DC | PRN
  Administered 2024-05-04: 11:00:00 90 mg via INTRAVENOUS

## 2024-05-04 NOTE — Anesthesia Preprocedure Evaluation (Signed)
 Anesthesia Evaluation  Patient identified by MRN, date of birth, ID band Patient awake    Reviewed: Allergy & Precautions, H&P , NPO status , Patient's Chart, lab work & pertinent test results, reviewed documented beta blocker date and time   Airway Mallampati: II   Neck ROM: full    Dental  (+) Poor Dentition   Pulmonary shortness of breath and with exertion, asthma , sleep apnea , COPD, former smoker   Pulmonary exam normal        Cardiovascular Exercise Tolerance: Good hypertension, On Medications negative cardio ROS Normal cardiovascular exam Rhythm:regular Rate:Normal     Neuro/Psych  Headaches PSYCHIATRIC DISORDERS Anxiety Depression       GI/Hepatic Neg liver ROS,GERD  Medicated,,  Endo/Other  negative endocrine ROS    Renal/GU negative Renal ROS  negative genitourinary   Musculoskeletal   Abdominal   Peds  Hematology negative hematology ROS (+)   Anesthesia Other Findings Past Medical History: No date: Abnormal Pap smear of cervix     Comment:  h/o LEEP No date: Allergy No date: Angio-edema No date: Anxiety No date: Asthma No date: Bladder leak No date: Cancer [facility])     Comment:  melanoma 2003 No date: Chest pain No date: Constipation No date: COPD (chronic obstructive pulmonary disease) (HCC) No date: Depression No date: Diarrhea No date: Food allergy No date: Heart valve problem No date: Heartburn No date: History of swelling of feet No date: Hyperlipidemia No date: Hypertension No date: IBS (irritable bowel syndrome) No date: Joint pain No date: Low back pain 2002: Melanoma (HCC)     Comment:  Right foot s/p lymph node removed right groin. Breslow's              1.1 mm. Clark's level IV. Excision/sentinel node biopsy               Dr. Tommas at [facility]. No date: Obesity (BMI 30-39.9) No date: Palpitation No date: Prediabetes No date: Shortness of breath No date: Urticaria No date:  Vaginal delivery     Comment:  X 2 Past Surgical History: 07/2017: BLADDER SUSPENSION 07/01/2017: COLONOSCOPY WITH PROPOFOL ; N/A     Comment:  Procedure: COLONOSCOPY WITH PROPOFOL ;  Surgeon:               Janalyn Keene NOVAK, MD;  Location: [facility] ENDOSCOPY;                Service: Endoscopy;  Laterality: N/A; 08/05/2022: COLONOSCOPY WITH PROPOFOL ; N/A     Comment:  Procedure: COLONOSCOPY WITH PROPOFOL ;  Surgeon: Therisa Bi, MD;  Location: [facility] ENDOSCOPY;  Service:               Gastroenterology;  Laterality: N/A; 07/01/2017: ESOPHAGOGASTRODUODENOSCOPY (EGD) WITH PROPOFOL ; N/A     Comment:  Procedure: ESOPHAGOGASTRODUODENOSCOPY (EGD) WITH               PROPOFOL ;  Surgeon: Janalyn Keene NOVAK, MD;  Location:               [facility] ENDOSCOPY;  Service: Endoscopy;  Laterality: N/A; 1995: LEEP No date: LEEP 08/2017: PARTIAL HYSTERECTOMY     Comment:  s/p partial hysterectomy - per patient has fallopian               tubes; ovaries INTACT. NO CERVIX on exam 03/06/20 No date: SKIN CANCER EXCISION     Comment:  melanoma No date:  TONSILLECTOMY BMI    Body Mass Index: 35.98 kg/m     Reproductive/Obstetrics negative OB ROS                              Anesthesia Physical Anesthesia Plan  ASA: 3  Anesthesia Plan: General   Post-op Pain Management:    Induction:   PONV Risk Score and Plan:   Airway Management Planned:   Additional Equipment:   Intra-op Plan:   Post-operative Plan:   Informed Consent: I have reviewed the patients History and Physical, chart, labs and discussed the procedure including the risks, benefits and alternatives for the proposed anesthesia with the patient or authorized representative who has indicated his/her understanding and acceptance.     Dental Advisory Given  Plan Discussed with: CRNA  Anesthesia Plan Comments:         Anesthesia Quick Evaluation

## 2024-05-04 NOTE — Anesthesia Postprocedure Evaluation (Signed)
 Anesthesia Post Note  Patient: Brittney Ruiz  Procedure(s) Performed: COLONOSCOPY EGD (ESOPHAGOGASTRODUODENOSCOPY)  Patient location during evaluation: PACU Anesthesia Type: General Level of consciousness: awake and alert Pain management: pain level controlled Vital Signs Assessment: post-procedure vital signs reviewed and stable Respiratory status: spontaneous breathing, nonlabored ventilation, respiratory function stable and patient connected to nasal cannula oxygen Cardiovascular status: blood pressure returned to baseline and stable Postop Assessment: no apparent nausea or vomiting Anesthetic complications: no   No notable events documented.   Last Vitals:  Vitals:   05/04/24 1010  BP: 133/84  Pulse: 60  Temp: (!) 35.7 C  SpO2: 100%    Last Pain:  Vitals:   05/04/24 1010  TempSrc: Temporal  PainSc: 2                  Lynwood KANDICE Clause

## 2024-05-04 NOTE — Op Note (Signed)
 [facility] Gastroenterology Patient Name: Brittney Ruiz Procedure Date: 05/04/2024 10:30 AM MRN: [id number] Account #: [id number] Date of Birth: Dec 29, 1965 Admit Type: Outpatient Age: 58 Room: [facility] ENDO ROOM 3 Gender: Female Note Status: Finalized Instrument Name: Barnie GI Scope [id number] Procedure:             Upper GI endoscopy Indications:           Abdominal pain, Follow-up of gastroparesis Providers:             Ruel Kung MD, MD Referring MD:          Rollene MATSU. Arnett (Referring MD) Medicines:             Monitored Anesthesia Care Complications:         No immediate complications. Procedure:             Pre-Anesthesia Assessment:                        - Prior to the procedure, a History and Physical was                         performed, and patient medications, allergies and                         sensitivities were reviewed. The patient's tolerance                         of previous anesthesia was reviewed.                        - The risks and benefits of the procedure and the                         sedation options and risks were discussed with the                         patient. All questions were answered and informed                         consent was obtained.                        - ASA Grade Assessment: II - A patient with mild                         systemic disease.                        After obtaining informed consent, the endoscope was                         passed under direct vision. Throughout the procedure,                         the patient's blood pressure, pulse, and oxygen                         saturations were monitored continuously. The Endoscope  was introduced through the mouth, and advanced to the                         third part of duodenum. The upper GI endoscopy was                         accomplished with ease. The patient tolerated the                         procedure  well. Findings:      The examined duodenum was normal.      The esophagus was normal.      The gastroesophageal flap valve was visualized endoscopically and       classified as Hill Grade IV (no fold, wide open lumen, hiatal hernia       present).      Localized moderate inflammation characterized by congestion (edema) and       erythema was found in the gastric antrum. Biopsies were taken with a       cold forceps for histology. Impression:            - Normal examined duodenum.                        - Normal esophagus.                        - Gastroesophageal flap valve classified as Hill Grade                         IV (no fold, wide open lumen, hiatal hernia present).                        - Gastritis. Biopsied. Recommendation:        - Await pathology results.                        - Perform a colonoscopy today. Procedure Code(s):     --- Professional ---                        [phone], Esophagogastroduodenoscopy, flexible,                         transoral; with biopsy, single or multiple Diagnosis Code(s):     --- Professional ---                        K44.9, Diaphragmatic hernia without obstruction or                         gangrene                        K29.70, Gastritis, unspecified, without bleeding                        R10.9, Unspecified abdominal pain                        K31.84, Gastroparesis CPT copyright 2022 American Medical Association. All rights reserved. The codes documented in this report are preliminary and upon coder review  may  be revised to meet current compliance requirements. Ruel Kung, MD Ruel Kung MD, MD 05/04/2024 10:45:07 AM This report has been signed electronically. Number of Addenda: 0 Note Initiated On: 05/04/2024 10:30 AM Estimated Blood Loss:  Estimated blood loss: none.      [facility]

## 2024-05-04 NOTE — Op Note (Signed)
 [facility] Gastroenterology Patient Name: Brittney Ruiz Procedure Date: 05/04/2024 10:29 AM MRN: [id number] Account #: [id number] Date of Birth: 1966/01/27 Admit Type: Outpatient Age: 58 Room: [facility] ENDO ROOM 3 Gender: Female Note Status: Finalized Instrument Name: Colon Scope [id number] Procedure:             Colonoscopy Indications:           Change in stool caliber, Chronic idiopathic                         constipation Providers:             Ruel Kung MD, MD Referring MD:          Rollene MATSU. Arnett (Referring MD) Medicines:             Monitored Anesthesia Care Complications:         No immediate complications. Procedure:             Pre-Anesthesia Assessment:                        - Prior to the procedure, a History and Physical was                         performed, and patient medications, allergies and                         sensitivities were reviewed. The patient's tolerance                         of previous anesthesia was reviewed.                        - The risks and benefits of the procedure and the                         sedation options and risks were discussed with the                         patient. All questions were answered and informed                         consent was obtained.                        - ASA Grade Assessment: II - A patient with mild                         systemic disease.                        After obtaining informed consent, the colonoscope was                         passed under direct vision. Throughout the procedure,                         the patient's blood pressure, pulse, and oxygen                         saturations were monitored continuously. The  Colonoscope was introduced through the anus and                         advanced to the the cecum, identified by the                         appendiceal orifice. The colonoscopy was performed                         with ease. The  patient tolerated the procedure well.                         The quality of the bowel preparation was excellent.                         The ileocecal valve, appendiceal orifice, and rectum                         were photographed. Findings:      The perianal and digital rectal examinations were normal.      Multiple medium-mouthed diverticula were found in the sigmoid colon.      Non-bleeding internal hemorrhoids were found during retroflexion. The       hemorrhoids were medium-sized and Grade I (internal hemorrhoids that do       not prolapse).      The exam was otherwise without abnormality on direct and retroflexion       views. Impression:            - Diverticulosis in the sigmoid colon.                        - Non-bleeding internal hemorrhoids.                        - The examination was otherwise normal on direct and                         retroflexion views.                        - No specimens collected. Recommendation:        - Discharge patient to home.                        - Resume previous diet.                        - Continue present medications.                        - Repeat colonoscopy in 10 years for screening                         purposes. Procedure Code(s):     --- Professional ---                        [phone], Colonoscopy, flexible; diagnostic, including                         collection of specimen(s) by brushing or washing, when  performed (separate procedure) Diagnosis Code(s):     --- Professional ---                        K64.0, First degree hemorrhoids                        R19.5, Other fecal abnormalities                        K59.04, Chronic idiopathic constipation                        K57.30, Diverticulosis of large intestine without                         perforation or abscess without bleeding CPT copyright 2022 American Medical Association. All rights reserved. The codes documented in this report are  preliminary and upon coder review may  be revised to meet current compliance requirements. Ruel Kung, MD Ruel Kung MD, MD 05/04/2024 11:01:02 AM This report has been signed electronically. Number of Addenda: 0 Note Initiated On: 05/04/2024 10:29 AM Scope Withdrawal Time: 0 hours 8 minutes 36 seconds  Total Procedure Duration: 0 hours 12 minutes 50 seconds  Estimated Blood Loss:  Estimated blood loss: none.      [facility]

## 2024-05-04 NOTE — H&P (Signed)
 Ruel Kung , MD [address], [place], [place], [address] Phone: [phone] Fax: [phone]  Primary Care Physician:  Dineen Rollene MATSU, FNP   Pre-Procedure History & Physical: HPI:  Brittney Ruiz is a 58 y.o. female is here for an colonoscopy.   Past Medical History:  Diagnosis Date   Abnormal Pap smear of cervix    h/o LEEP   Allergy    Angio-edema    Anxiety    Asthma    Bladder leak    Cancer (HCC)    melanoma 2003   Chest pain    Constipation    COPD (chronic obstructive pulmonary disease) (HCC)    Depression    Diarrhea    Food allergy    Heart valve problem    Heartburn    History of swelling of feet    Hyperlipidemia    Hypertension    IBS (irritable bowel syndrome)    Joint pain    Low back pain    Melanoma (HCC) 2002   Right foot s/p lymph node removed right groin. Breslow's 1.1 mm. Clark's level IV. Excision/sentinel node biopsy Dr. Tommas at [facility]   Obesity (BMI 30-39.9)    Palpitation    Prediabetes    Shortness of breath    Urticaria    Vaginal delivery    X 2    Past Surgical History:  Procedure Laterality Date   BLADDER SUSPENSION  07/2017   COLONOSCOPY WITH PROPOFOL  N/A 07/01/2017   Procedure: COLONOSCOPY WITH PROPOFOL ;  Surgeon: Janalyn Keene NOVAK, MD;  Location: [facility] ENDOSCOPY;  Service: Endoscopy;  Laterality: N/A;   COLONOSCOPY WITH PROPOFOL  N/A 08/05/2022   Procedure: COLONOSCOPY WITH PROPOFOL ;  Surgeon: Kung Ruel, MD;  Location: [facility] ENDOSCOPY;  Service: Gastroenterology;  Laterality: N/A;   ESOPHAGOGASTRODUODENOSCOPY (EGD) WITH PROPOFOL  N/A 07/01/2017   Procedure: ESOPHAGOGASTRODUODENOSCOPY (EGD) WITH PROPOFOL ;  Surgeon: Janalyn Keene NOVAK, MD;  Location: [facility] ENDOSCOPY;  Service: Endoscopy;  Laterality: N/A;   LEEP  1995   LEEP     PARTIAL HYSTERECTOMY  08/2017   s/p partial hysterectomy -  per patient has fallopian tubes; ovaries INTACT. NO CERVIX on exam 03/06/20   SKIN CANCER EXCISION     melanoma   TONSILLECTOMY      Prior to Admission medications  Medication Sig Start Date End Date Taking? Authorizing Provider  Accu-Chek Softclix Lancets lancets Use as instructed 04/03/24   Dineen Rollene MATSU, FNP  acetaminophen  (TYLENOL ) 500 MG tablet Take 500 mg by mouth every 6 (six) hours as needed.    [provider]  albuterol  (VENTOLIN  HFA) 108 (90 Base) MCG/ACT inhaler USE 2 INHALATIONS ORALLY   EVERY 6 HOURS AS NEEDED. 09/11/22   Tamea Dedra CROME, MD  Alcohol  Swabs (B-D SINGLE USE SWABS REGULAR) PADS USE 1 SWAB TOPICALLY TO    CLEAN FINGER BEFORE        CHECKING GLUCOSE 10/21/21   Dineen Rollene MATSU, FNP  aspirin EC 81 MG tablet Take 81 mg by mouth.    [provider]  B Complex Vitamins (BL VITAMIN B COMPLEX PO) +Folic acid  qd. 04/13/17   [provider]  Blood Glucose Monitoring Suppl (ACCU-CHEK AVIVA PLUS) w/Device KIT MONITOR YOUR SUGARS DAILY 04/03/24   Dineen Rollene MATSU, FNP  Calcium  Carb-Cholecalciferol 600-10 MG-MCG CAPS Take by mouth.    [provider]  clorazepate (TRANXENE) 7.5 MG tablet Take 7.5 mg by mouth 3 (three) times daily.    [provider]  clotrimazole  (LOTRIMIN ) 1 % cream Apply 1 Application topically 2 (two) times daily. 02/17/24   Dineen Rollene MATSU, FNP  EPINEPHrine  0.3 mg/0.3 mL IJ SOAJ injection Inject 0.3 mg into the muscle as needed for anaphylaxis. 10/26/23   Dineen Rollene MATSU, FNP  fexofenadine  (ALLEGRA ) 60 MG tablet Take 1 tablet (60 mg total) by mouth daily. 06/03/21   Dineen Rollene MATSU, FNP  fluticasone  (FLONASE ) 50 MCG/ACT nasal spray USE 1 SPRAY IN EACH NOSTRILAT BEDTIME 11/08/23   Dineen Rollene MATSU, FNP  glucose blood test strip Use as instructed 04/03/24   Dineen Rollene MATSU, FNP  ibuprofen  (ADVIL ) 100 MG/5ML suspension Take 200 mg by mouth every 4 (four) hours as needed. Takes 200-300 mg daily as  needed    [provider]  Lactulose  20 GM/30ML SOLN Take 30 ml every 4 hours until constipation relieved. 03/31/24   Dineen Rollene MATSU, FNP  Multiple Vitamin (MULTIVITAMIN) tablet Take 1 tablet by mouth daily.    [provider]  Omega-3 Fatty Acids (FISH OIL) 1000 MG CAPS Take 2 capsules by mouth daily. Take 2 capsules daily which is 1200 mg per serving    [provider]  omeprazole  (PRILOSEC) 20 MG capsule TAKE 1 CAPSULE DAILY 12/03/23   Dineen Rollene MATSU, FNP  ondansetron  (ZOFRAN -ODT) 4 MG disintegrating tablet Take 2 mg by mouth every 6 (six) hours as needed.    [provider]  polyethylene glycol powder (GLYCOLAX /MIRALAX ) 17 GM/SCOOP powder Take 1 Container by mouth at bedtime.    [provider]  propranolol  (INDERAL ) 20 MG tablet Take 1 tablet (20 mg total) by mouth 3 (three) times daily. 07/16/15   Vannie Delon LABOR, MD  Pseudoephedrine-DM (CHILDRENS DIMETAPP PLUS PO) Take by mouth. As needed    [provider]  psyllium (METAMUCIL) 58.6 % powder Take 1 packet by mouth daily.    [provider]  sertraline  (ZOLOFT ) 25 MG tablet Take 25 mg by mouth 2 (two) times daily.    [provider]    Allergies as of 04/27/2024 - Review Complete 04/27/2024  Allergen Reaction Noted   Codeine Other (See Comments) and Hives 09/23/1983   Elemental sulfur Swelling, Hives, and Other (See Comments) 09/23/1983   Porcine (pork) protein-containing drug products Swelling 10/11/2012   Pork allergy Swelling 10/11/2012   Aciphex [rabeprazole sodium]  03/10/2011   Antivert [meclizine hcl]  03/10/2011   Buspar [buspirone hcl]  03/10/2011   Cat dander  10/11/2012   Chlorpheniramine-pseudoeph  03/10/2011   Chlorpheniramine-pseudoeph Other (See Comments) 06/05/2016   Chocolate flavoring agent (non-screening)  10/14/2012   Crestor [rosuvastatin calcium ]  06/10/2017   Cymbalta [duloxetine hcl]  03/10/2011   Duloxetine Other (See Comments)  10/11/2012   Effexor xr [venlafaxine hydrochloride]  03/10/2011   Epinephrine   03/10/2011   Erythromycin  03/10/2011   Escitalopram oxalate  03/10/2011   Lamictal [lamotrigine]  03/10/2011   Levofloxacin  Swelling 04/03/2011   Lipitor [atorvastatin ]  08/09/2015   Meclizine Other (See Comments) 10/11/2012   Mometasone furoate  03/10/2011   Mometasone furoate  03/10/2011   Monosodium glutamate  10/11/2012   Neurontin [gabapentin]  03/10/2011   Other  10/11/2012   Prednisone  03/10/2011   Red yeast rice extract  10/18/2020   Rosuvastatin  09/09/2022   Shellfish allergy  10/11/2012   Sudafed [pseudoephedrine hcl]  03/10/2011   Sulfa antibiotics Other (See Comments) 03/10/2011   Sulfasalazine Other (See Comments) 03/10/2011   Valium   03/10/2011   Wellbutrin [bupropion hcl]  03/10/2011  Wixela inhub [fluticasone -salmeterol]  06/01/2019   Xanax  xr [alprazolam ]  03/27/2011   Zetia  [ezetimibe ]  02/29/2020   Alfalfa Rash 10/15/2012   Brompheniramine-phenylephrine Palpitations 12/05/2012   Buspirone Rash and Other (See Comments) 10/11/2012   Tape Rash 03/10/2011    Family History  Problem Relation Age of Onset   Hyperlipidemia Mother    Hypertension Mother    Diabetes Mother    Breast cancer Mother 49   Stroke Mother    Cancer Father        lung   Depression Father    Diabetes Father    Hypertension Father    Hyperlipidemia Father    Sleep apnea Father    Alcoholism Father    Hyperlipidemia Sister    Hypertension Sister    Migraines Sister    Hypertension Brother    Hyperlipidemia Brother    Migraines Brother    Cancer Maternal Grandmother        glioblastoma   Depression Maternal Grandfather    Heart disease Maternal Grandfather    Cancer Paternal Grandfather        unknown   AAA (abdominal aortic aneurysm) Paternal Grandfather     Social History   Socioeconomic History   Marital status: Married    Spouse name: Not on file   Number of children: Not on file    Years of education: Not on file   Highest education level: Associate degree: academic program  Occupational History   Occupation: disability  Tobacco Use   Smoking status: Former    Current packs/day: 0.00    Average packs/day: 1 pack/day for 25.0 years (25.0 ttl pk-yrs)    Types: Cigarettes    Start date: 27    Quit date: 2008    Years since quitting: 17.9   Smokeless tobacco: Never  Vaping Use   Vaping status: Never Used  Substance and Sexual Activity   Alcohol  use: No   Drug use: No   Sexual activity: Yes  Other Topics Concern   Not on file  Social History Narrative   Married 25 years.        2 children by 1st husband.       Disabled. Not working.    Right handed      One story home      Social Drivers of Health   Tobacco Use: Medium Risk (04/27/2024)   Patient History    Smoking Tobacco Use: Former    Smokeless Tobacco Use: Never    Passive Exposure: Not on file  Financial Resource Strain: High Risk (03/29/2024)   Overall Financial Resource Strain (CARDIA)    Difficulty of Paying Living Expenses: Hard  Food Insecurity: Food Insecurity Present (03/29/2024)   Epic    Worried About Programme Researcher, Broadcasting/film/video in the Last Year: Sometimes true    [employer] of Food in the Last Year: Often true  Transportation Needs: Unmet Transportation Needs (03/29/2024)   Epic    Lack of Transportation (Medical): Yes    Lack of Transportation (Non-Medical): No  Physical Activity: Inactive (03/29/2024)   Exercise Vital Sign    Days of Exercise per Week: 0 days    Minutes of Exercise per Session: Not on file  Stress: Stress Concern Present (03/29/2024)   [employer] of Occupational Health - Occupational Stress Questionnaire    Feeling of Stress: To some extent  Social Connections: Socially Integrated (03/29/2024)   Social Connection and Isolation Panel    Frequency of Communication with Friends and  Family: More than three times a week    Frequency of Social Gatherings with  Friends and Family: Once a week    Attends Religious Services: More than 4 times per year    Active Member of [employer] or Organizations: Yes    Attends Banker Meetings: More than 4 times per year    Marital Status: Married  Catering Manager Violence: Not At Risk (08/04/2023)   Humiliation, Afraid, Rape, and Kick questionnaire    Fear of Current or Ex-Partner: No    Emotionally Abused: No    Physically Abused: No    Sexually Abused: No  Depression (PHQ2-9): High Risk (02/03/2024)   Depression (PHQ2-9)    PHQ-2 Score: 13  Alcohol  Screen: Low Risk (08/03/2023)   Alcohol  Screen    Last Alcohol  Screening Score (AUDIT): 0  Housing: Low Risk (03/29/2024)   Epic    Unable to Pay for Housing in the Last Year: No    Number of Times Moved in the Last Year: 0    Homeless in the Last Year: No  Utilities: Not At Risk (09/13/2023)   Received from [facility] Utilities    Threatened with loss of utilities: No  Health Literacy: Adequate Health Literacy (08/04/2023)   B1300 Health Literacy    Frequency of need for help with medical instructions: Never    Review of Systems: See HPI, otherwise negative ROS  Physical Exam: LMP  (LMP Unknown)  General:   Alert,  pleasant and cooperative in NAD Head:  Normocephalic and atraumatic. Neck:  Supple; no masses or thyromegaly. Lungs:  Clear throughout to auscultation, normal respiratory effort.    Heart:  +S1, +S2, Regular rate and rhythm, No edema. Abdomen:  Soft, nontender and nondistended. Normal bowel sounds, without guarding, and without rebound.   Neurologic:  Alert and  oriented x4;  grossly normal neurologically.  Impression/Plan: LIANY MUMPOWER is here for an colonoscopy to be performed for constipation and change in bowel habits, gastroparesis Risks, benefits, limitations, and alternatives regarding  colonoscopy have been reviewed with the patient.  Questions have been answered.  All parties  agreeable.   Ruel Kung, MD  05/04/2024, 9:51 AM

## 2024-05-04 NOTE — Transfer of Care (Signed)
 Immediate Anesthesia Transfer of Care Note  Patient: Brittney Ruiz  Procedure(s) Performed: COLONOSCOPY EGD (ESOPHAGOGASTRODUODENOSCOPY)  Patient Location: Endoscopy Unit  Anesthesia Type:General  Level of Consciousness: drowsy  Airway & Oxygen Therapy: Patient Spontanous Breathing  Post-op Assessment: Report given to RN and Post -op Vital signs reviewed and stable  Post vital signs: Reviewed and stable  Last Vitals:  Vitals Value Taken Time  BP 110/86   Temp    Pulse 65 05/04/24 11:02  Resp 15 05/04/24 11:02  SpO2 100 % 05/04/24 11:02  Vitals shown include unfiled device data.  Last Pain:  Vitals:   05/04/24 1010  TempSrc: Temporal  PainSc: 2          Complications: No notable events documented.

## 2024-05-06 LAB — SURGICAL PATHOLOGY

## 2024-05-08 ENCOUNTER — Ambulatory Visit: Payer: Self-pay | Admitting: Gastroenterology

## 2024-05-10 DIAGNOSIS — R109 Unspecified abdominal pain: Secondary | ICD-10-CM | POA: Diagnosis not present

## 2024-05-10 DIAGNOSIS — R161 Splenomegaly, not elsewhere classified: Secondary | ICD-10-CM | POA: Diagnosis not present

## 2024-05-16 ENCOUNTER — Ambulatory Visit
Admission: RE | Admit: 2024-05-16 | Discharge: 2024-05-16 | Disposition: A | Discharge status: Home / Self Care | Source: Ambulatory Visit | Attending: Family | Admitting: Family

## 2024-05-16 DIAGNOSIS — Z1231 Encounter for screening mammogram for malignant neoplasm of breast: Secondary | ICD-10-CM | POA: Insufficient documentation

## 2024-06-01 NOTE — Progress Notes (Signed)
 Brittney Ruiz                                          MRN: [id number]   06/01/2024   The [facility] Quality Team Specialist reviewed this patient medical record for the purposes of chart review for care gap closure. The following were reviewed: abstraction for care gap closure-controlling blood pressure.    [facility] Quality Team

## 2024-07-06 ENCOUNTER — Ambulatory Visit: Admitting: Family

## 2024-08-07 ENCOUNTER — Ambulatory Visit

## 2025-04-10 ENCOUNTER — Ambulatory Visit: Admitting: Dermatology
